# Patient Record
Sex: Male | Born: 1955
Health system: Southern US, Community
[De-identification: ages and names within clinical notes are randomized; demographics above are authoritative.]

## PROBLEM LIST (undated history)

## (undated) DIAGNOSIS — I10 Essential (primary) hypertension: Secondary | ICD-10-CM

## (undated) DIAGNOSIS — T7840XA Allergy, unspecified, initial encounter: Secondary | ICD-10-CM

## (undated) DIAGNOSIS — G8929 Other chronic pain: Secondary | ICD-10-CM

## (undated) DIAGNOSIS — R011 Cardiac murmur, unspecified: Secondary | ICD-10-CM

## (undated) DIAGNOSIS — Z950 Presence of cardiac pacemaker: Secondary | ICD-10-CM

## (undated) DIAGNOSIS — N419 Inflammatory disease of prostate, unspecified: Secondary | ICD-10-CM

## (undated) DIAGNOSIS — F419 Anxiety disorder, unspecified: Secondary | ICD-10-CM

## (undated) DIAGNOSIS — G4730 Sleep apnea, unspecified: Secondary | ICD-10-CM

## (undated) DIAGNOSIS — F32A Depression, unspecified: Secondary | ICD-10-CM

## (undated) DIAGNOSIS — F329 Major depressive disorder, single episode, unspecified: Secondary | ICD-10-CM

## (undated) DIAGNOSIS — E119 Type 2 diabetes mellitus without complications: Secondary | ICD-10-CM

## (undated) DIAGNOSIS — G709 Myoneural disorder, unspecified: Secondary | ICD-10-CM

## (undated) DIAGNOSIS — Z22322 Carrier or suspected carrier of Methicillin resistant Staphylococcus aureus: Secondary | ICD-10-CM

## (undated) DIAGNOSIS — N200 Calculus of kidney: Secondary | ICD-10-CM

## (undated) DIAGNOSIS — G43909 Migraine, unspecified, not intractable, without status migrainosus: Secondary | ICD-10-CM

## (undated) DIAGNOSIS — N433 Hydrocele, unspecified: Secondary | ICD-10-CM

## (undated) DIAGNOSIS — Z87442 Personal history of urinary calculi: Secondary | ICD-10-CM

## (undated) DIAGNOSIS — M1990 Unspecified osteoarthritis, unspecified site: Secondary | ICD-10-CM

## (undated) HISTORY — DX: Myoneural disorder, unspecified: G70.9

## (undated) HISTORY — DX: Major depressive disorder, single episode, unspecified: F32.9

## (undated) HISTORY — DX: Inflammatory disease of prostate, unspecified: N41.9

## (undated) HISTORY — DX: Carrier or suspected carrier of methicillin resistant Staphylococcus aureus: Z22.322

## (undated) HISTORY — DX: Sleep apnea, unspecified: G47.30

## (undated) HISTORY — DX: Presence of cardiac pacemaker: Z95.0

## (undated) HISTORY — DX: Allergy, unspecified, initial encounter: T78.40XA

## (undated) HISTORY — DX: Calculus of kidney: N20.0

## (undated) HISTORY — DX: Essential (primary) hypertension: I10

## (undated) HISTORY — DX: Other chronic pain: G89.29

## (undated) HISTORY — DX: Anxiety disorder, unspecified: F41.9

## (undated) HISTORY — DX: Hydrocele, unspecified: N43.3

## (undated) HISTORY — DX: Migraine, unspecified, not intractable, without status migrainosus: G43.909

## (undated) HISTORY — PX: SPINE SURGERY: SHX786

## (undated) HISTORY — DX: Depression, unspecified: F32.A

## (undated) HISTORY — DX: Personal history of urinary calculi: Z87.442

---
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
## 2004-08-07 HISTORY — PX: CARPAL TUNNEL RELEASE: SHX101

## 2005-01-11 ENCOUNTER — Ambulatory Visit: Payer: Self-pay | Admitting: Family Medicine

## 2005-04-27 ENCOUNTER — Ambulatory Visit: Payer: Self-pay | Admitting: Family Medicine

## 2005-07-21 ENCOUNTER — Ambulatory Visit (HOSPITAL_COMMUNITY): Admission: RE | Admit: 2005-07-21 | Discharge: 2005-07-21 | Payer: Self-pay | Admitting: Neurosurgery

## 2005-08-04 ENCOUNTER — Ambulatory Visit (HOSPITAL_COMMUNITY): Admission: RE | Admit: 2005-08-04 | Discharge: 2005-08-04 | Payer: Self-pay | Admitting: Neurosurgery

## 2006-05-28 ENCOUNTER — Other Ambulatory Visit: Payer: Self-pay

## 2006-05-29 ENCOUNTER — Observation Stay: Payer: Self-pay | Admitting: Internal Medicine

## 2006-06-15 ENCOUNTER — Ambulatory Visit: Payer: Self-pay | Admitting: Cardiology

## 2006-07-26 ENCOUNTER — Encounter: Admission: RE | Admit: 2006-07-26 | Discharge: 2006-07-26 | Payer: Self-pay | Admitting: Neurosurgery

## 2006-08-07 HISTORY — PX: CERVICAL SPINE SURGERY: SHX589

## 2006-08-07 HISTORY — PX: ULNAR NERVE REPAIR: SHX2594

## 2006-08-21 ENCOUNTER — Ambulatory Visit (HOSPITAL_COMMUNITY): Admission: RE | Admit: 2006-08-21 | Discharge: 2006-08-22 | Payer: Self-pay | Admitting: Neurosurgery

## 2006-10-11 ENCOUNTER — Ambulatory Visit: Payer: Self-pay | Admitting: Cardiology

## 2006-10-19 ENCOUNTER — Ambulatory Visit: Payer: Self-pay | Admitting: Neurosurgery

## 2006-11-27 ENCOUNTER — Encounter: Payer: Self-pay | Admitting: Neurosurgery

## 2006-12-06 ENCOUNTER — Encounter: Payer: Self-pay | Admitting: Neurosurgery

## 2006-12-28 ENCOUNTER — Encounter: Admission: RE | Admit: 2006-12-28 | Discharge: 2006-12-28 | Payer: Self-pay | Admitting: Neurosurgery

## 2007-04-05 LAB — HM COLONOSCOPY: HM Colonoscopy: NORMAL

## 2007-05-20 ENCOUNTER — Ambulatory Visit: Payer: Self-pay | Admitting: Neurological Surgery

## 2007-06-05 ENCOUNTER — Ambulatory Visit (HOSPITAL_COMMUNITY): Admission: RE | Admit: 2007-06-05 | Discharge: 2007-06-05 | Payer: Self-pay | Admitting: Neurological Surgery

## 2007-07-08 HISTORY — PX: COLONOSCOPY: SHX174

## 2007-08-05 ENCOUNTER — Ambulatory Visit: Payer: Self-pay | Admitting: Unknown Physician Specialty

## 2007-08-08 HISTORY — PX: ROTATOR CUFF REPAIR: SHX139

## 2007-08-08 HISTORY — PX: PACEMAKER PLACEMENT: SHX43

## 2007-08-14 ENCOUNTER — Ambulatory Visit: Payer: Self-pay | Admitting: Cardiology

## 2007-08-14 ENCOUNTER — Other Ambulatory Visit: Payer: Self-pay

## 2007-08-15 ENCOUNTER — Other Ambulatory Visit: Payer: Self-pay

## 2008-02-17 ENCOUNTER — Ambulatory Visit: Payer: Self-pay | Admitting: Unknown Physician Specialty

## 2008-02-24 ENCOUNTER — Ambulatory Visit: Payer: Self-pay | Admitting: Unknown Physician Specialty

## 2008-05-19 ENCOUNTER — Encounter: Payer: Self-pay | Admitting: Unknown Physician Specialty

## 2008-06-07 ENCOUNTER — Encounter: Payer: Self-pay | Admitting: Unknown Physician Specialty

## 2008-07-22 ENCOUNTER — Encounter: Payer: Self-pay | Admitting: Unknown Physician Specialty

## 2009-02-01 ENCOUNTER — Ambulatory Visit: Payer: Self-pay | Admitting: Pain Medicine

## 2009-02-16 ENCOUNTER — Ambulatory Visit: Payer: Self-pay | Admitting: Pain Medicine

## 2009-03-09 ENCOUNTER — Ambulatory Visit: Payer: Self-pay | Admitting: Physician Assistant

## 2009-03-23 ENCOUNTER — Ambulatory Visit: Payer: Self-pay | Admitting: Pain Medicine

## 2009-04-13 ENCOUNTER — Ambulatory Visit: Payer: Self-pay | Admitting: Physician Assistant

## 2010-08-07 HISTORY — PX: LITHOTRIPSY: SUR834

## 2010-12-20 NOTE — Op Note (Signed)
Mark [name], Mark [name]                ACCOUNT NO.:  [id number]   MEDICAL RECORD NO.:  [id number]          PATIENT TYPE:  AMB   LOCATION:  SDS                          FACILITY:  [facility]   PHYSICIAN:  Tia Alert, MD     DATE OF BIRTH:  [birth year]   DATE OF PROCEDURE:  05/30/2007  DATE OF DISCHARGE:                               OPERATIVE REPORT   PREOPERATIVE DIAGNOSIS:  Left ulnar neuropathy.   POSTOPERATIVE DIAGNOSIS:  Left ulnar neuropathy.   PROCEDURE:  Left ulnar nerve decompression.   SURGEON:  Tia Alert, M.D.   ANESTHESIA:  General endotracheal.   COMPLICATIONS:  None apparent.   INDICATIONS FOR THE PROCEDURE:  Mark [name] is a 55 year old gentleman who  is referred with numbness and tingling in the left fourth and fifth  digits, with weakness and clumsiness of the left hand.  Nerve conduction  studies were consistent with a significant left ulnar neuropathy at the  elbow.  I recommended ulnar nerve decompression.  He understood the  risks, benefits, and expected outcome and wished to proceed.   DESCRIPTION OF THE PROCEDURE:  The patient was taken to the operating  room, and after induction of adequate generalized endotracheal  anesthesia his left arm was extended on an arm board and then prepped  with DuraPrep from axilla of to the fingertips, and then draped in the  usual sterile fashion.  8 mL of local anesthesia was injected, and a  curvilinear incision was made over the medial epicondyle of the ulna.  Dissection was carried down through the soft tissues, marching along the  olecranon to identify the ulnar groove.  I then palpated through and  dissected through scar tissue there overlying the ulnar nerve.  I  dissected proximally first up toward the triceps musculature,  decompressing the nerve as I went.  I then dissected distally until I  had to open the flexor retinaculum overlying the nerve.  I saw the  takeoff of the nerve to the flexor carpi ulnaris.  I  continued to  dissect along the nerve.  I did not circumferentially decompress the  nerve, as I was afraid this might affect the blood supply of the nerve,  but I did bend the elbow.  It did not seem to be compressed.  I did  remove significant scar tissue from the top of the ulnar nerve just  distal to the elbow.  I felt like this was the compressive lesion.  I  then palpated proximally and distally with a curved hemostat.  I felt  like I had a good decompression of the ulnar nerve.  I irrigated with  saline solution containing bacitracin and then closed in layers of 2-0  Vicryl in the subcutaneous tissues and 3-0 Vicryl in  the subcuticular tissues, and the skin was closed with Benzoin and Steri-  Strips.  The arm was then wrapped in a Kerlix and an Ace bandage.  The  patient was then awakened from general anesthesia and transferred to the  recovery room in stable condition.  At the end of the procedure, all  sponge, needle, and instrument counts were correct.      Tia Alert, MD  Electronically Signed     DSJ/MEDQ  D:  06/05/2007  T:  06/06/2007  Job:  [id number]

## 2010-12-23 NOTE — Op Note (Signed)
Mark [name], Mark [name]                ACCOUNT NO.:  [id number]   MEDICAL RECORD NO.:  [id number]          PATIENT TYPE:  AMB   LOCATION:  SDS                          FACILITY:  [facility]   PHYSICIAN:  Reinaldo Meeker, M.D. DATE OF BIRTH:  23-Feb-1956   DATE OF PROCEDURE:  08/04/2005  DATE OF DISCHARGE:  08/04/2005                                 OPERATIVE REPORT   PREOPERATIVE DIAGNOSIS:  Right carpal tunnel syndrome.   POSTOPERATIVE DIAGNOSIS:  Right carpal tunnel syndrome.   OPERATION PERFORMED:  Right carpal tunnel release.   SURGEON:  Reinaldo Meeker, M.D.   OPERATION IN DETAIL:  After the induction of general anesthesia the  patient's hand, wrist and forearm were prepped and draped in the usual  sterile fashion.  A curvilinear incision was made starting at the wrist  heading into the palm in line with the ring finger, then slightly in the  radial direction.  The subcutaneous tissue incised and a self-retaining  retractor was placed for exposure.   Starting at the wrist the median nerve was identified as it entered the  carpal tunnel under the transverse carpal ligament.  The ligament was then  incised from a proximal-to-distal direction.  This gave excellent  decompression of the underlying median nerve.  At this point inspection was  carried out in all directions for any evidence of residual and none could be  identified.  Large amounts of irrigation were carried out.  Any bleeding was  controlled with bipolar coagulation.   The wound was then closed using interrupted Vicryl on the subcutaneous  tissue and interrupted nylon on the skin.  A sterile dressing and an Ace  wrap were then applied.   The patient was then taken to the recovery room in stable condition.           ______________________________  Reinaldo Meeker, M.D.     ROK/MEDQ  D:  08/04/2005  T:  08/05/2005  Job:  [id number]

## 2010-12-23 NOTE — Op Note (Signed)
NAME:  Mark [name], Mark [name]                ACCOUNT NO.:  [id number]   MEDICAL RECORD NO.:  [id number]          PATIENT TYPE:  AMB   LOCATION:  SDS                          FACILITY:  [facility]   PHYSICIAN:  Reinaldo Meeker, M.D. DATE OF BIRTH:  1956-03-01   DATE OF PROCEDURE:  07/21/2005  DATE OF DISCHARGE:                                 OPERATIVE REPORT   PREOPERATIVE DIAGNOSIS:  Left carpal tunnel syndrome.   POSTOPERATIVE DIAGNOSIS:  Left carpal tunnel syndrome.   PROCEDURE:  Left carpal tunnel release.   PROCEDURE IN DETAIL:  Used on the patient's left upper extremity, and his  arm was prepped and draped in the usual sterile fashion.  The case was then  started with a curvilinear incision starting in the left palm at the level  of wrist in line with the ring finger, heading up into the palm and then  turned in a slightly radial direction.  Unfortunately, the patient was  unable to stay still and cooperate for the procedure, appeared to be under  some discomfort, so it was therefore elected to put him under brief LMA  general anesthetic.  This was done without incident and the case was carried  forward.   Soft tissues of the palm were then incised.  Self-retaining retractor was  placed for exposure.  Dissection was carried under the transverse carpal  ligament which was then incised and this decompressed and exposed the  underlying median nerve.  Very thorough decompression was carried up into  the mid palm and also up into the distal wrist by undermining the skin of  the distal wrist.   At this time, inspection was carried out in all directions without any  evidence of residual compression and none could be identified.  Irrigation  was carried out.  The wound was then closed with interrupted Vicryl on the  platysma muscle and inverted nylon on the skin.  Bulky sterile dressing was  then applied.  The patient was taken to the recovery room in stable  condition.     ______________________________  Reinaldo Meeker, M.D.     ROK/MEDQ  D:  07/21/2005  T:  07/24/2005  Job:  [id number]

## 2010-12-23 NOTE — Op Note (Signed)
NAME:  Mark [name], Mark [name]                ACCOUNT NO.:  [id number]   MEDICAL RECORD NO.:  [id number]          PATIENT TYPE:  OIB   LOCATION:  3172                         FACILITY:  [facility]   PHYSICIAN:  Reinaldo Meeker, M.D. DATE OF BIRTH:  May 13, 1956   DATE OF PROCEDURE:  08/21/2006  DATE OF DISCHARGE:                               OPERATIVE REPORT   PREOPERATIVE DIAGNOSIS:  Spondylosis C5-6 bilateral.   POSTOPERATIVE DIAGNOSIS:  Spondylosis C5-6 bilateral.   PROCEDURE:  C5-6 anterior cervical diskectomy with bone bank fusion  followed by Mystique anterior cervical plating.   SURGEON:  Reinaldo Meeker, M.D.   ASSISTANT:  Kathaleen Maser. Pool, M.D.   PROCEDURE:  After being placed in the supine position in 10 pounds  halter traction, the patient's neck was prepped and draped in the usual  sterile fashion.  Localizing fluoroscopy was used prior to incision to  identify the appropriate level.  Transverse incision was made in the  right anterior neck starting at the midline and heading towards the  medial aspect of sternocleidomastoid muscle.  The platysmas was incised  transversely.  The natural fascial plane reflecting the strap muscles  medially and sternocleidomastoid laterally was identified and followed  down to the anterior aspect cervical spine.  Longus colli muscles were  identified, split in the midline, stripped away bilaterally with Chief Operating Officer.  Self retractor was placed for exposure.  X-  rays showed approach the appropriate level.  Using 15 blade the annulus  of the disk at C5-C6 was incised.  Using pituitary rongeurs and  curettes, approximately 90% of the disk material was removed.  High-  speed drill used to widen the interspace.  At this time the microscope  was draped brought in the field and used for remainder of the case.  Using microsection technique, the remainder of disk material on the  posterior longitudinal ligament was removed.  The ligament  was then  incised transversely and the cut edge removed with Kerrison punch.  Thorough decompression was then carried out on the spinal dura and  proximal foramen bilaterally until the C6 nerve roots were identified  and well decompressed.  At this time, inspection was carried out in all  directions for any evidence of residual compression and none could be  identified.  Large amounts of irrigation were carried out.  Measurements  were taken and 8 mm bone bank plug was reconstituted.  After irrigating  once more and confirming hemostasis, the plug was impacted difficulty.  Fluoroscopy showed it to be in good position.  An appropriate length  Mystique anterior cervical plate was then chosen.  Under fluoroscopic  guidance drill holes were placed followed by tapping, retapping and  placing 13 mm, screws x4.  Final fluoroscopy showed the plate screws and  plug to all be in good position.  Large amounts of irrigation carried  out, any bleeding was controlled bipolar coagulation.  The wound was  then closed with interrupted Vicryl on the platysma muscle, inverted 5-0  PDS in subcuticular layer and Steri-Strips on the skin.  Sterile  dressing and soft collar applied.  The patient was extubated, taken to  recovery room in stable condition.          ______________________________  Reinaldo Meeker, M.D.    ROK/MEDQ  D:  08/21/2006  T:  08/21/2006  Job:  [id number]

## 2011-03-27 ENCOUNTER — Encounter: Payer: Self-pay | Admitting: Internal Medicine

## 2011-03-28 ENCOUNTER — Encounter: Payer: Self-pay | Admitting: Internal Medicine

## 2011-03-28 ENCOUNTER — Ambulatory Visit (INDEPENDENT_AMBULATORY_CARE_PROVIDER_SITE_OTHER): Payer: Medicare Other | Admitting: Internal Medicine

## 2011-03-28 VITALS — BP 121/80 | HR 75 | Temp 98.3°F | Ht 68.0 in | Wt 208.0 lb

## 2011-03-28 DIAGNOSIS — F331 Major depressive disorder, recurrent, moderate: Secondary | ICD-10-CM | POA: Insufficient documentation

## 2011-03-28 DIAGNOSIS — G894 Chronic pain syndrome: Secondary | ICD-10-CM | POA: Insufficient documentation

## 2011-03-28 DIAGNOSIS — G8929 Other chronic pain: Secondary | ICD-10-CM

## 2011-03-28 DIAGNOSIS — F329 Major depressive disorder, single episode, unspecified: Secondary | ICD-10-CM

## 2011-03-28 DIAGNOSIS — F32A Depression, unspecified: Secondary | ICD-10-CM

## 2011-03-28 MED ORDER — OXYCODONE-ACETAMINOPHEN 7.5-500 MG PO TABS: 1.0000 | ORAL_TABLET | Freq: Two times a day (BID) | ORAL | Status: DC | PRN

## 2011-03-28 MED ORDER — OXYCODONE HCL 10 MG PO TB12: 10.0000 mg | ORAL_TABLET | Freq: Two times a day (BID) | ORAL | Status: DC

## 2011-03-28 NOTE — Progress Notes (Signed)
  Subjective:    Patient ID: Mark [name], male    DOB: 05-Nov-1955, 55 y.o.   MRN: [id number]  Back Pain This is a chronic problem. The current episode started more than 1 year ago. The problem occurs constantly. The problem is unchanged. The pain is present in the thoracic spine. The quality of the pain is described as aching and burning. The pain does not radiate. The pain is severe. The pain is worse during the day (with prolonged standing or activity). The symptoms are aggravated by standing. Associated symptoms include numbness and paresthesias. Pertinent negatives include no abdominal pain, bladder incontinence, bowel incontinence, chest pain, fever or weakness. Risk factors include obesity. He has tried analgesics, muscle relaxant and heat (steroid injection, cervical spine decompression) for the symptoms. The treatment provided significant (with use of low dose narcotic) relief.      Review of Systems  Constitutional: Negative for fever, chills, diaphoresis, activity change, appetite change, fatigue and unexpected weight change.  HENT: Positive for neck pain.   Respiratory: Negative for cough and shortness of breath.   Cardiovascular: Negative for chest pain and leg swelling.  Gastrointestinal: Negative for abdominal pain, diarrhea, constipation and bowel incontinence.  Genitourinary: Negative for bladder incontinence.  Musculoskeletal: Positive for myalgias, back pain and arthralgias. Negative for gait problem.  Neurological: Positive for numbness and paresthesias. Negative for dizziness and weakness.       Objective:   Physical Exam  Constitutional: He appears well-developed and well-nourished. No distress.  HENT:  Head: Normocephalic and atraumatic.  Eyes: Conjunctivae and EOM are normal. Pupils are equal, round, and reactive to light. No scleral icterus.  Neck: Normal range of motion. Neck supple.  Cardiovascular: Normal rate, regular rhythm and normal heart sounds.  Exam  reveals no gallop and no friction rub.   No murmur heard. Pulmonary/Chest: Effort normal and breath sounds normal. No respiratory distress. He has no wheezes. He has no rales. He exhibits no tenderness.  Musculoskeletal: Normal range of motion.  Neurological: He is alert. He has normal strength. No sensory deficit. He exhibits normal muscle tone.  Skin: Skin is warm and dry. He is not diaphoretic.  Psychiatric: He has a normal mood and affect. His behavior is normal. Judgment and thought content normal.           Assessment:    Chronic thoracic and cervical spine back pain. Secondary to nerve compression after cervical spine surgery.  Has been followed by neurology and steroid injection was attempted with no improvement.  Recently, has done very well on low dose of oxycontin and prn percocet for pain relief. Uses percocet only on rare occasions. Denies narcotic side effects such as drowsiness, constipation.   Plan:    Natural history and expected course discussed. Questions answered. Agricultural engineer distributed. Pt will continue Oxycontin and prn percocet. UDS submitted today. RTC 3 months.

## 2011-03-28 NOTE — Progress Notes (Signed)
Addended by: Jobie Quaker on: 03/28/2011 03:54 PM   Modules accepted: Orders

## 2011-03-28 NOTE — Patient Instructions (Signed)

## 2011-03-29 LAB — DRUG SCREEN, URINE
Amphetamine Screen, Ur: NEGATIVE
Barbiturate Quant, Ur: NEGATIVE
Benzodiazepines.: NEGATIVE
Cocaine Metabolites: NEGATIVE
Creatinine,U: 134.7 mg/dL
Marijuana Metabolite: NEGATIVE
Methadone: NEGATIVE
Opiates: POSITIVE — AB
Phencyclidine (PCP): NEGATIVE
Propoxyphene: NEGATIVE

## 2011-04-24 ENCOUNTER — Other Ambulatory Visit: Payer: Self-pay | Admitting: *Deleted

## 2011-04-24 DIAGNOSIS — G8929 Other chronic pain: Secondary | ICD-10-CM

## 2011-04-24 MED ORDER — CLONAZEPAM 0.5 MG PO TABS: 0.5000 mg | ORAL_TABLET | Freq: Three times a day (TID) | ORAL | Status: DC | PRN

## 2011-04-24 MED ORDER — OXYCODONE HCL 10 MG PO TB12: 10.0000 mg | ORAL_TABLET | Freq: Two times a day (BID) | ORAL | Status: DC

## 2011-04-24 NOTE — Telephone Encounter (Signed)
Patient is asking that the klonopin be called in to [facility] [address] and says that his wife will pick up the oxycontin rx at her appt this wed.

## 2011-05-17 LAB — BASIC METABOLIC PANEL
BUN: 8
CO2: 28
Calcium: 9.4
Chloride: 105
Creatinine, Ser: 0.74
GFR calc Af Amer: >60
GFR calc non Af Amer: >60
Glucose, Bld: 156 — ABNORMAL HIGH
Potassium: 3.6
Sodium: 141

## 2011-05-17 LAB — PROTIME-INR
INR: 1
Prothrombin Time: 13.7

## 2011-05-17 LAB — CBC
HCT: 48.1
Hemoglobin: 16.8
MCHC: 34.9
MCV: 87.9
Platelets: 190
RBC: 5.47
RDW: 13.2
WBC: 7.7

## 2011-05-17 LAB — APTT: aPTT: 29

## 2011-05-23 ENCOUNTER — Other Ambulatory Visit: Payer: Self-pay | Admitting: Internal Medicine

## 2011-05-23 DIAGNOSIS — G8929 Other chronic pain: Secondary | ICD-10-CM

## 2011-05-23 MED ORDER — OXYCODONE HCL 10 MG PO TB12: 10.0000 mg | ORAL_TABLET | Freq: Two times a day (BID) | ORAL | Status: DC

## 2011-05-23 NOTE — Telephone Encounter (Signed)
Pt would like to get refill on his oxycontin 10mg  2x daily every 12 hours. Please call when ready

## 2011-05-29 ENCOUNTER — Other Ambulatory Visit: Payer: Self-pay | Admitting: Internal Medicine

## 2011-05-29 DIAGNOSIS — G8929 Other chronic pain: Secondary | ICD-10-CM

## 2011-05-29 MED ORDER — OXYCODONE HCL 10 MG PO TB12: 10.0000 mg | ORAL_TABLET | Freq: Two times a day (BID) | ORAL | Status: DC

## 2011-06-05 ENCOUNTER — Other Ambulatory Visit: Payer: Self-pay | Admitting: Internal Medicine

## 2011-06-07 ENCOUNTER — Ambulatory Visit: Payer: Self-pay | Admitting: Urology

## 2011-06-14 ENCOUNTER — Ambulatory Visit: Payer: Self-pay | Admitting: Urology

## 2011-06-19 ENCOUNTER — Ambulatory Visit: Payer: Self-pay | Admitting: Urology

## 2011-06-19 ENCOUNTER — Other Ambulatory Visit: Payer: Self-pay | Admitting: Internal Medicine

## 2011-06-19 DIAGNOSIS — G8929 Other chronic pain: Secondary | ICD-10-CM

## 2011-06-19 MED ORDER — OXYCODONE HCL 10 MG PO TB12: 10.0000 mg | ORAL_TABLET | Freq: Two times a day (BID) | ORAL | Status: DC

## 2011-06-21 ENCOUNTER — Ambulatory Visit: Payer: Self-pay | Admitting: Urology

## 2011-06-26 ENCOUNTER — Ambulatory Visit (INDEPENDENT_AMBULATORY_CARE_PROVIDER_SITE_OTHER): Payer: Medicare Other | Admitting: Internal Medicine

## 2011-06-26 ENCOUNTER — Encounter: Payer: Self-pay | Admitting: Internal Medicine

## 2011-06-26 VITALS — BP 112/78 | HR 92 | Temp 98.4°F | Resp 16 | Wt 193.2 lb

## 2011-06-26 DIAGNOSIS — G8929 Other chronic pain: Secondary | ICD-10-CM

## 2011-06-26 DIAGNOSIS — N200 Calculus of kidney: Secondary | ICD-10-CM | POA: Insufficient documentation

## 2011-06-26 MED ORDER — OXYCODONE-ACETAMINOPHEN 7.5-500 MG PO TABS: 1.0000 | ORAL_TABLET | Freq: Two times a day (BID) | ORAL | Status: DC | PRN

## 2011-06-26 MED ORDER — OXYCODONE HCL 10 MG PO TB12: 10.0000 mg | ORAL_TABLET | Freq: Two times a day (BID) | ORAL | Status: DC

## 2011-06-26 NOTE — Assessment & Plan Note (Signed)
Secondary to chronic degenerative changes and nerve compression in thoracic spine. S/p extensive evaluation and interventions including steroid injections at pain clinic without improvement. Pain well controlled on current medications. Will continue oxycontin for baseline pain control with oxycodone for breakthrough pain. Follow up 3 months.

## 2011-06-26 NOTE — Progress Notes (Signed)
Subjective:    Patient ID: Mark [name], male    DOB: [birth year], 55 y.o.   MRN: [id number]  HPI 55YO male with h/o chronic back pain secondary to degenerative changes and nerve compression in thoracic spine, presents for follow up. Pain is described as severe and aching. Located upper back. Occasionally radiates down arms. No weakness noted BUE.  Pain controlled with chronic oxycontin and prn oxycodone. Pt has followed medication instructions. No early refill requests. UDS last visit negative.  Pt also notes recent episode of bilateral nephrolithiasis.  Notes that he is scheduled for right sided lithotripsy next month. Continues to have right sided flank pain. No fever, chills. No obvious hematuria.  Outpatient Encounter Prescriptions as of 06/26/2011  Medication Sig Dispense Refill  . aspirin 81 MG tablet Take 81 mg by mouth daily.        Marland Kitchen b complex vitamins tablet Take 1 tablet by mouth daily.        . cetirizine (ZYRTEC) 10 MG tablet Take 10 mg by mouth daily.        . chlorzoxazone (PARAFON) 500 MG tablet Take 500 mg by mouth every 6 (six) hours as needed.        . Cholecalciferol (VITAMIN D3) 1000 UNITS CAPS Take 1 Units by mouth daily.        . clindamycin (CLEOCIN) 150 MG capsule Take 150 mg by mouth daily. 4 caps 1 hour before dental procedures.       . clonazePAM (KLONOPIN) 0.5 MG tablet Take 1 tablet (0.5 mg total) by mouth 3 (three) times daily as needed.  90 tablet  4  . CRANBERRY PO Take 3,600 mg by mouth 2 (two) times daily.        Marland Kitchen desonide (DESOWEN) 0.05 % lotion Apply 1 application topically daily.        . fish oil-omega-3 fatty acids 1000 MG capsule Take 2 g by mouth daily.        Marland Kitchen FLUoxetine (PROZAC) 20 MG capsule Take 20 mg by mouth daily.        Marland Kitchen FLUoxetine (PROZAC) 40 MG capsule Take 40 mg by mouth daily.        . fluticasone (FLONASE) 50 MCG/ACT nasal spray Place 2 sprays into the nose daily.        . furosemide (LASIX) 20 MG tablet TAKE 1 TABLET EVERY MORNING   90 tablet  3  . Garlic 1000 MG CAPS Take 1 capsule by mouth daily.        . Multiple Vitamin (MULTIVITAMIN PO) Take 1 tablet by mouth daily.        . naproxen (NAPROSYN) 500 MG tablet Take 500 mg by mouth 2 (two) times daily with a meal.        . omeprazole (PRILOSEC) 20 MG capsule Take 20 mg by mouth daily.        Marland Kitchen oxyCODONE (OXYCONTIN) 10 MG 12 hr tablet Take 1 tablet (10 mg total) by mouth every 12 (twelve) hours.  60 tablet  0  . oxyCODONE-acetaminophen (PERCOCET) 7.5-500 MG per tablet Take 1 tablet by mouth 2 (two) times daily as needed.  60 tablet  0  . traZODone (DESYREL) 100 MG tablet Take 100 mg by mouth at bedtime.          Review of Systems  Constitutional: Negative for fever, chills, diaphoresis and fatigue.  HENT: Positive for neck pain.   Respiratory: Negative for cough and shortness of breath.   Cardiovascular: Negative  for chest pain.  Gastrointestinal: Negative for abdominal pain.  Genitourinary: Positive for flank pain (recently with bilateral nephrolithiasis).  Musculoskeletal: Positive for myalgias, back pain and arthralgias.  Psychiatric/Behavioral: Negative for dysphoric mood. The patient is not nervous/anxious.    BP 112/78  Pulse 92  Temp(Src) 98.4 F (36.9 C) (Oral)  Resp 16  Wt 193 lb 4 oz (87.658 kg)  SpO2 94%     Objective:   Physical Exam  Constitutional: He is oriented to person, place, and time. He appears well-developed and well-nourished. No distress.  HENT:  Head: Normocephalic and atraumatic.  Right Ear: External ear normal.  Left Ear: External ear normal.  Nose: Nose normal.  Mouth/Throat: Oropharynx is clear and moist. No oropharyngeal exudate.  Eyes: Conjunctivae and EOM are normal. Pupils are equal, round, and reactive to light. Right eye exhibits no discharge. Left eye exhibits no discharge. No scleral icterus.  Neck: Normal range of motion. Neck supple. No tracheal deviation present. No thyromegaly present.  Cardiovascular: Normal  rate, regular rhythm and normal heart sounds.  Exam reveals no gallop and no friction rub.   No murmur heard. Pulmonary/Chest: Effort normal and breath sounds normal. No respiratory distress. He has no wheezes. He has no rales. He exhibits no tenderness.  Musculoskeletal: He exhibits no edema.       Cervical back: He exhibits decreased range of motion, tenderness, pain and spasm.  Lymphadenopathy:    He has no cervical adenopathy.  Neurological: He is alert and oriented to person, place, and time. No cranial nerve deficit. Coordination normal.  Skin: Skin is warm and dry. No rash noted. He is not diaphoretic. No erythema. No pallor.  Psychiatric: He has a normal mood and affect. His behavior is normal. Judgment and thought content normal.          Assessment & Plan:

## 2011-06-26 NOTE — Assessment & Plan Note (Signed)
Followed by urology. Scheduled for lithotripsy next month. Will continue to monitor.

## 2011-06-27 ENCOUNTER — Other Ambulatory Visit: Payer: Self-pay | Admitting: Internal Medicine

## 2011-06-28 ENCOUNTER — Ambulatory Visit: Payer: Medicare Other | Admitting: Internal Medicine

## 2011-07-03 ENCOUNTER — Ambulatory Visit: Payer: Medicare Other | Admitting: Internal Medicine

## 2011-07-06 ENCOUNTER — Ambulatory Visit: Payer: Self-pay | Admitting: Urology

## 2011-07-19 ENCOUNTER — Other Ambulatory Visit: Payer: Self-pay | Admitting: Internal Medicine

## 2011-07-24 ENCOUNTER — Ambulatory Visit: Payer: Self-pay | Admitting: Urology

## 2011-08-29 ENCOUNTER — Ambulatory Visit: Payer: Self-pay | Admitting: Urology

## 2011-09-27 ENCOUNTER — Encounter: Payer: Self-pay | Admitting: Internal Medicine

## 2011-09-27 ENCOUNTER — Ambulatory Visit: Payer: Self-pay | Admitting: Urology

## 2011-09-27 ENCOUNTER — Ambulatory Visit (INDEPENDENT_AMBULATORY_CARE_PROVIDER_SITE_OTHER): Payer: Medicare Other | Admitting: Internal Medicine

## 2011-09-27 DIAGNOSIS — N200 Calculus of kidney: Secondary | ICD-10-CM

## 2011-09-27 DIAGNOSIS — G4700 Insomnia, unspecified: Secondary | ICD-10-CM

## 2011-09-27 DIAGNOSIS — G8929 Other chronic pain: Secondary | ICD-10-CM

## 2011-09-27 MED ORDER — OXYCODONE HCL 10 MG PO TB12: 10.0000 mg | ORAL_TABLET | Freq: Two times a day (BID) | ORAL | Status: DC

## 2011-09-27 MED ORDER — OXYCODONE-ACETAMINOPHEN 7.5-500 MG PO TABS: 1.0000 | ORAL_TABLET | Freq: Two times a day (BID) | ORAL | Status: DC | PRN

## 2011-09-27 MED ORDER — ZOLPIDEM TARTRATE ER 6.25 MG PO TBCR: 6.2500 mg | EXTENDED_RELEASE_TABLET | Freq: Every evening | ORAL | Status: DC | PRN

## 2011-09-27 NOTE — Assessment & Plan Note (Signed)
No improvement with trazodone, so stopped medication. Will try Ambien 6.25mg  daily. Follow up if symptoms not improved with medication.

## 2011-09-27 NOTE — Assessment & Plan Note (Signed)
Secondary to chronic DJD in thoracic spine.  Stable on current medications for >2 years. Will continue at current dose. Follow up q3 months while on narcotic medications.

## 2011-09-27 NOTE — Progress Notes (Signed)
Subjective:    Patient ID: Mark [name], male    DOB: 04-Jan-1956, 56 y.o.   MRN: [id number]  HPI 56YO male with h/o chronic pain in his thoracic spine presents for follow up.  In regards to chronic pain, he reports symptoms are well controlled. He reports full compliance with his medications. He has recently been living with his mother and helping her move items in her home, so occasionally has to use percocet for breakthrough pain.  He denies drowsiness, constipation or other symptoms from use of medications.  He has not requested early refills and illicit drug screens have been negative.  He is concerned today about recent insomnia. He has difficulty both falling and staying asleep.  He was taking trazodone, with little improvement. He would like to try another medication.  He practices good sleep hygiene.  In regards to recent episode of nephrolithiasis, he denies any recurrent symptoms of pain, hematuria.  He had follow up renal US today and results are pending.  Outpatient Encounter Prescriptions as of 09/27/2011  Medication Sig Dispense Refill  . aspirin 81 MG tablet Take 81 mg by mouth daily.        Marland Kitchen b complex vitamins tablet Take 1 tablet by mouth daily.        . cetirizine (ZYRTEC) 10 MG tablet Take 10 mg by mouth daily.        . Cholecalciferol (VITAMIN D3) 1000 UNITS CAPS Take 1 Units by mouth daily.        . clindamycin (CLEOCIN) 150 MG capsule Take 150 mg by mouth daily. 4 caps 1 hour before dental procedures.       . clonazePAM (KLONOPIN) 0.5 MG tablet Take 1 tablet (0.5 mg total) by mouth 3 (three) times daily as needed.  90 tablet  4  . CRANBERRY PO Take 3,600 mg by mouth 2 (two) times daily.        Marland Kitchen desonide (DESOWEN) 0.05 % lotion Apply 1 application topically daily.        . fish oil-omega-3 fatty acids 1000 MG capsule Take 2 g by mouth daily.        Marland Kitchen FLUoxetine (PROZAC) 20 MG capsule TAKE 1 CAPSULE DAILY  90 capsule  3  . FLUoxetine (PROZAC) 40 MG capsule TAKE 1 CAPSULE  DAILY  90 capsule  3  . fluticasone (FLONASE) 50 MCG/ACT nasal spray Place 2 sprays into the nose daily.        . furosemide (LASIX) 20 MG tablet TAKE 1 TABLET EVERY MORNING  90 tablet  3  . Garlic 1000 MG CAPS Take 1 capsule by mouth daily.        . Multiple Vitamin (MULTIVITAMIN PO) Take 1 tablet by mouth daily.        . naproxen (NAPROSYN) 500 MG tablet Take 500 mg by mouth 2 (two) times daily with a meal.        . omeprazole (PRILOSEC) 20 MG capsule TAKE 1 CAPSULE DAILY  90 capsule  3  . oxyCODONE (OXYCONTIN) 10 MG 12 hr tablet Take 1 tablet (10 mg total) by mouth every 12 (twelve) hours.  60 tablet  0  . oxyCODONE-acetaminophen (PERCOCET) 7.5-500 MG per tablet Take 1 tablet by mouth 2 (two) times daily as needed.  60 tablet  0  . chlorzoxazone (PARAFON) 500 MG tablet Take 500 mg by mouth every 6 (six) hours as needed.        . zolpidem (AMBIEN CR) 6.25 MG CR tablet Take  1 tablet (6.25 mg total) by mouth at bedtime as needed for sleep.  30 tablet  3  . DISCONTD: traZODone (DESYREL) 100 MG tablet Take 100 mg by mouth at bedtime.         BP 110/70  Pulse 87  Temp(Src) 98.3 F (36.8 C) (Oral)  Ht 5' 7.5" (1.715 m)  Wt 189 lb (85.73 kg)  BMI 29.16 kg/m2  SpO2 97%  Review of Systems  Constitutional: Negative for fever, chills, activity change, appetite change, fatigue and unexpected weight change.  HENT: Positive for neck pain.   Eyes: Negative for visual disturbance.  Respiratory: Negative for cough and shortness of breath.   Cardiovascular: Negative for chest pain, palpitations and leg swelling.  Gastrointestinal: Negative for abdominal pain and abdominal distention.  Genitourinary: Negative for dysuria, urgency and difficulty urinating.  Musculoskeletal: Positive for myalgias, back pain and arthralgias. Negative for gait problem.  Skin: Negative for color change and rash.  Hematological: Negative for adenopathy.  Psychiatric/Behavioral: Positive for sleep disturbance. Negative for  dysphoric mood. The patient is not nervous/anxious.        Objective:   Physical Exam  Constitutional: He is oriented to person, place, and time. He appears well-developed and well-nourished. No distress.  HENT:  Head: Normocephalic and atraumatic.  Right Ear: External ear normal.  Left Ear: External ear normal.  Nose: Nose normal.  Mouth/Throat: Oropharynx is clear and moist. No oropharyngeal exudate.  Eyes: Conjunctivae and EOM are normal. Pupils are equal, round, and reactive to light. Right eye exhibits no discharge. Left eye exhibits no discharge. No scleral icterus.  Neck: Normal range of motion. Neck supple. No tracheal deviation present. No thyromegaly present.  Cardiovascular: Normal rate, regular rhythm and normal heart sounds.  Exam reveals no gallop and no friction rub.   No murmur heard. Pulmonary/Chest: Effort normal and breath sounds normal. No respiratory distress. He has no wheezes. He has no rales. He exhibits no tenderness.  Musculoskeletal: He exhibits no edema.       Cervical back: He exhibits decreased range of motion, tenderness, pain and spasm.  Lymphadenopathy:    He has no cervical adenopathy.  Neurological: He is alert and oriented to person, place, and time. No cranial nerve deficit. Coordination normal.  Skin: Skin is warm and dry. No rash noted. He is not diaphoretic. No erythema. No pallor.  Psychiatric: He has a normal mood and affect. His behavior is normal. Judgment and thought content normal.          Assessment & Plan:

## 2011-09-27 NOTE — Assessment & Plan Note (Signed)
S/p lithotripsy and passage of stones. Doing very well with no additional symptoms.  Will continue to monitor.

## 2011-09-28 LAB — COMPREHENSIVE METABOLIC PANEL
ALT: 27 U/L (ref 0–53)
AST: 29 U/L (ref 0–37)
Albumin: 4.3 g/dL (ref 3.5–5.2)
Alkaline Phosphatase: 49 U/L (ref 39–117)
BUN: 19 mg/dL (ref 6–23)
CO2: 29 mEq/L (ref 19–32)
Calcium: 9.4 mg/dL (ref 8.4–10.5)
Chloride: 104 mEq/L (ref 96–112)
Creatinine, Ser: 0.9 mg/dL (ref 0.4–1.5)
GFR: 90.88 mL/min (ref >60.00)
Glucose, Bld: 92 mg/dL (ref 70–99)
Potassium: 3.7 mEq/L (ref 3.5–5.1)
Sodium: 141 mEq/L (ref 135–145)
Total Bilirubin: 0.6 mg/dL (ref 0.3–1.2)
Total Protein: 6.8 g/dL (ref 6.0–8.3)

## 2011-10-09 ENCOUNTER — Encounter: Payer: Self-pay | Admitting: Internal Medicine

## 2011-10-09 ENCOUNTER — Ambulatory Visit (INDEPENDENT_AMBULATORY_CARE_PROVIDER_SITE_OTHER): Payer: Medicare Other | Admitting: Internal Medicine

## 2011-10-09 VITALS — BP 118/78 | HR 83 | Temp 97.2°F | Ht 67.5 in | Wt 190.0 lb

## 2011-10-09 DIAGNOSIS — G5790 Unspecified mononeuropathy of unspecified lower limb: Secondary | ICD-10-CM

## 2011-10-09 DIAGNOSIS — M792 Neuralgia and neuritis, unspecified: Secondary | ICD-10-CM

## 2011-10-09 MED ORDER — CHLORZOXAZONE 500 MG PO TABS: 500.0000 mg | ORAL_TABLET | Freq: Four times a day (QID) | ORAL | Status: DC | PRN

## 2011-10-09 MED ORDER — PREDNISONE (PAK) 10 MG PO TABS: ORAL_TABLET | ORAL | Status: AC

## 2011-10-09 NOTE — Progress Notes (Signed)
Subjective:    Patient ID: Mark [name], male    DOB: Jun 15, 1956, 56 y.o.   MRN: [id number]  HPI 56YO male with h/o chronic pain secondary to degenerative changes in the thoracic spine presents for acute visit c/o 1 week h/o right sided lower back pain.  Pain is described as aching and extending from spine to right flank.  No known injury to lower back.  Pt recently had renal US showing no nephrolithiasis. He denies hematuria, fever, chills.  He has been using left over muscle relaxer and percocet for pain with improvement.  He denies any weakness in his right leg.  Outpatient Encounter Prescriptions as of 10/09/2011  Medication Sig Dispense Refill  . aspirin 81 MG tablet Take 81 mg by mouth daily.        Marland Kitchen b complex vitamins tablet Take 1 tablet by mouth daily.        . cetirizine (ZYRTEC) 10 MG tablet Take 10 mg by mouth daily.        . chlorzoxazone (PARAFON) 500 MG tablet Take 1 tablet (500 mg total) by mouth every 6 (six) hours as needed.  180 tablet  3  . Cholecalciferol (VITAMIN D3) 1000 UNITS CAPS Take 1 Units by mouth daily.        . clindamycin (CLEOCIN) 150 MG capsule Take 150 mg by mouth daily. 4 caps 1 hour before dental procedures.       . clonazePAM (KLONOPIN) 0.5 MG tablet Take 1 tablet (0.5 mg total) by mouth 3 (three) times daily as needed.  90 tablet  4  . CRANBERRY PO Take 3,600 mg by mouth 2 (two) times daily.        Marland Kitchen desonide (DESOWEN) 0.05 % lotion Apply 1 application topically daily.        . fish oil-omega-3 fatty acids 1000 MG capsule Take 2 g by mouth daily.        Marland Kitchen FLUoxetine (PROZAC) 20 MG capsule TAKE 1 CAPSULE DAILY  90 capsule  3  . FLUoxetine (PROZAC) 40 MG capsule TAKE 1 CAPSULE DAILY  90 capsule  3  . fluticasone (FLONASE) 50 MCG/ACT nasal spray Place 2 sprays into the nose daily.        . furosemide (LASIX) 20 MG tablet TAKE 1 TABLET EVERY MORNING  90 tablet  3  . Garlic 1000 MG CAPS Take 1 capsule by mouth daily.        . Multiple Vitamin (MULTIVITAMIN  PO) Take 1 tablet by mouth daily.        . naproxen (NAPROSYN) 500 MG tablet Take 500 mg by mouth 2 (two) times daily with a meal.        . omeprazole (PRILOSEC) 20 MG capsule TAKE 1 CAPSULE DAILY  90 capsule  3  . oxyCODONE (OXYCONTIN) 10 MG 12 hr tablet Take 1 tablet (10 mg total) by mouth every 12 (twelve) hours.  60 tablet  0  . oxyCODONE-acetaminophen (PERCOCET) 7.5-500 MG per tablet Take 1 tablet by mouth 2 (two) times daily as needed.  60 tablet  0  . zolpidem (AMBIEN CR) 6.25 MG CR tablet Take 1 tablet (6.25 mg total) by mouth at bedtime as needed for sleep.  30 tablet  3  . DISCONTD: chlorzoxazone (PARAFON) 500 MG tablet Take 500 mg by mouth every 6 (six) hours as needed.        . predniSONE (STERAPRED UNI-PAK) 10 MG tablet Take 60mg  day 1 then taper by 10mg  daily  21  tablet  0    Review of Systems  Constitutional: Negative for fever and chills.  Respiratory: Negative for cough and shortness of breath.   Cardiovascular: Negative for chest pain.  Genitourinary: Positive for flank pain. Negative for dysuria, urgency, frequency, hematuria, decreased urine volume, difficulty urinating and testicular pain.  Musculoskeletal: Positive for myalgias, back pain and arthralgias.   BP 118/78  Pulse 83  Temp(Src) 97.2 F (36.2 C) (Oral)  Ht 5' 7.5" (1.715 m)  Wt 190 lb (86.183 kg)  BMI 29.32 kg/m2  SpO2 99%     Objective:   Physical Exam  Constitutional: He appears well-developed and well-nourished. No distress.  HENT:  Head: Normocephalic and atraumatic.  Eyes: EOM are normal. Pupils are equal, round, and reactive to light.  Neck: Normal range of motion.  Cardiovascular: Normal rate.   Pulmonary/Chest: Effort normal and breath sounds normal.  Abdominal: Soft.  Musculoskeletal: Normal range of motion. He exhibits tenderness.       Arms: Skin: He is not diaphoretic.          Assessment & Plan:

## 2011-10-09 NOTE — Assessment & Plan Note (Signed)
Likely secondary to DJD and nerve compression.  Will treat with steroid taper. Will continue muscle relaxants and prn percocet.  Pt will follow up with orthopedic surgeon for plain xray and further management.

## 2011-10-12 ENCOUNTER — Telehealth: Payer: Self-pay | Admitting: *Deleted

## 2011-10-12 NOTE — Telephone Encounter (Signed)
chlorzoxazone requires PA - Form pending completion. Need to know what muscle relaxers pt has tried in the past. (ex.Lottie Dawson, soma etc..) to complete form.

## 2011-10-13 ENCOUNTER — Telehealth: Payer: Self-pay | Admitting: Internal Medicine

## 2011-10-13 MED ORDER — NAPROXEN 500 MG PO TABS: 500.0000 mg | ORAL_TABLET | Freq: Two times a day (BID) | ORAL | Status: DC

## 2011-10-13 NOTE — Telephone Encounter (Signed)
Sent in, see other phone note, need to call pt to discuss muscle relaxer

## 2011-10-13 NOTE — Telephone Encounter (Signed)
[phone] Pt called to get refill on naproxen 500 mg 1 daily Mail order  [facility] mail rx Pt has 2 weeks worth of meds left  Pt is also checking on the muscle relaxer rx.

## 2011-10-16 NOTE — Telephone Encounter (Signed)
I spoke w/pt, he has tried other muscle relaxers, form completed and will be faxed in tomorrow.

## 2011-10-27 ENCOUNTER — Telehealth: Payer: Self-pay | Admitting: Internal Medicine

## 2011-10-27 MED ORDER — CLONAZEPAM 0.5 MG PO TABS: 0.5000 mg | ORAL_TABLET | Freq: Three times a day (TID) | ORAL | Status: DC | PRN

## 2011-10-27 NOTE — Telephone Encounter (Signed)
Medicare has approved Mark [name]'s Chalorzoxazone starting March 21,2013 for one year. An approval letter will be sent to the office and the patient.

## 2011-10-27 NOTE — Telephone Encounter (Signed)
Patient requesting RF of clonazepam 0.5 mg 1 tid prn - [facility] [place]

## 2011-10-27 NOTE — Telephone Encounter (Signed)
Called in.

## 2011-10-27 NOTE — Telephone Encounter (Signed)
Fine to fill. 

## 2011-12-25 ENCOUNTER — Encounter: Payer: Self-pay | Admitting: Internal Medicine

## 2011-12-25 ENCOUNTER — Ambulatory Visit (INDEPENDENT_AMBULATORY_CARE_PROVIDER_SITE_OTHER): Payer: Medicare Other | Admitting: Internal Medicine

## 2011-12-25 VITALS — BP 113/73 | HR 81 | Temp 98.4°F | Resp 16 | Wt 187.5 lb

## 2011-12-25 DIAGNOSIS — F419 Anxiety disorder, unspecified: Secondary | ICD-10-CM

## 2011-12-25 DIAGNOSIS — G4700 Insomnia, unspecified: Secondary | ICD-10-CM

## 2011-12-25 DIAGNOSIS — F411 Generalized anxiety disorder: Secondary | ICD-10-CM

## 2011-12-25 DIAGNOSIS — G8929 Other chronic pain: Secondary | ICD-10-CM

## 2011-12-25 MED ORDER — OXYCODONE-ACETAMINOPHEN 7.5-500 MG PO TABS: 1.0000 | ORAL_TABLET | Freq: Two times a day (BID) | ORAL | Status: DC | PRN

## 2011-12-25 MED ORDER — ZOLPIDEM TARTRATE ER 6.25 MG PO TBCR: 6.2500 mg | EXTENDED_RELEASE_TABLET | Freq: Every evening | ORAL | Status: DC | PRN

## 2011-12-25 MED ORDER — OXYCODONE HCL 10 MG PO TB12: 10.0000 mg | ORAL_TABLET | Freq: Two times a day (BID) | ORAL | Status: DC

## 2011-12-25 MED ORDER — CLONAZEPAM 1 MG PO TABS: 1.0000 mg | ORAL_TABLET | Freq: Three times a day (TID) | ORAL | Status: DC | PRN

## 2011-12-25 NOTE — Assessment & Plan Note (Signed)
Symptoms recently worsen while caring for elderly mother. Will increase Klonopin to 0.5-1 mg up to 3 times daily. Followup in 3 months or sooner if needed.

## 2011-12-25 NOTE — Assessment & Plan Note (Signed)
Secondary to chronic DJD in the thoracic spine with radicular pain. Stable on current medications for over 2 years. We'll continue her current dose. Followup every 3 months while on narcotics.

## 2011-12-25 NOTE — Progress Notes (Signed)
Subjective:    Patient ID: Mark [name], male    DOB: Aug 03, 1956, 56 y.o.   MRN: [id number]  HPI 56 year old male with history of chronic upper back pain secondary to DJD presents for followup. He reports that he is generally doing well. He reports that his pain is well-controlled with current medications. He reports full compliance with this medicine. He has not asked for any early refills.  He is concerned today about some worsening of his anxiety. He currently uses Clonopin 0.5 mg twice daily. For the last several months he has been caring for his mother and he reports significant increase in stress with this. He would like to potentially increase his Clonopin dose to help with symptoms. In the past he is taking Clonopin 3 times daily with improvement in symptoms. He denies any drowsiness or other side effects from this medication.  Outpatient Encounter Prescriptions as of 12/25/2011  Medication Sig Dispense Refill  . aspirin 81 MG tablet Take 81 mg by mouth daily.        Marland Kitchen b complex vitamins tablet Take 1 tablet by mouth daily.        . cetirizine (ZYRTEC) 10 MG tablet Take 10 mg by mouth daily.        . chlorzoxazone (PARAFON) 500 MG tablet Take 1 tablet (500 mg total) by mouth every 6 (six) hours as needed.  180 tablet  3  . Cholecalciferol (VITAMIN D3) 1000 UNITS CAPS Take 1 Units by mouth daily.        . clindamycin (CLEOCIN) 150 MG capsule Take 150 mg by mouth daily. 4 caps 1 hour before dental procedures.       . clonazePAM (KLONOPIN) 1 MG tablet Take 1 tablet (1 mg total) by mouth 3 (three) times daily as needed.  90 tablet  3  . CRANBERRY PO Take 3,600 mg by mouth 2 (two) times daily.        Marland Kitchen desonide (DESOWEN) 0.05 % lotion Apply 1 application topically daily.        . fish oil-omega-3 fatty acids 1000 MG capsule Take 2 g by mouth daily.        Marland Kitchen FLUoxetine (PROZAC) 20 MG capsule TAKE 1 CAPSULE DAILY  90 capsule  3  . FLUoxetine (PROZAC) 40 MG capsule TAKE 1 CAPSULE DAILY  90  capsule  3  . fluticasone (FLONASE) 50 MCG/ACT nasal spray Place 2 sprays into the nose daily.        . furosemide (LASIX) 20 MG tablet TAKE 1 TABLET EVERY MORNING  90 tablet  3  . Garlic 1000 MG CAPS Take 1 capsule by mouth daily.        . Multiple Vitamin (MULTIVITAMIN PO) Take 1 tablet by mouth daily.        . naproxen (NAPROSYN) 500 MG tablet Take 1 tablet (500 mg total) by mouth 2 (two) times daily with a meal.  180 tablet  1  . omeprazole (PRILOSEC) 20 MG capsule TAKE 1 CAPSULE DAILY  90 capsule  3  . oxyCODONE (OXYCONTIN) 10 MG 12 hr tablet Take 1 tablet (10 mg total) by mouth every 12 (twelve) hours.  60 tablet  0  . oxyCODONE-acetaminophen (PERCOCET) 7.5-500 MG per tablet Take 1 tablet by mouth 2 (two) times daily as needed.  60 tablet  0  . zolpidem (AMBIEN CR) 6.25 MG CR tablet Take 1 tablet (6.25 mg total) by mouth at bedtime as needed.  90 tablet  1  Review of Systems  Constitutional: Negative for fever, chills, activity change, appetite change, fatigue and unexpected weight change.  Eyes: Negative for visual disturbance.  Respiratory: Negative for cough and shortness of breath.   Cardiovascular: Negative for chest pain, palpitations and leg swelling.  Gastrointestinal: Negative for abdominal pain and abdominal distention.  Genitourinary: Negative for dysuria, urgency and difficulty urinating.  Musculoskeletal: Positive for myalgias, back pain and arthralgias. Negative for gait problem.  Skin: Negative for color change and rash.  Hematological: Negative for adenopathy.  Psychiatric/Behavioral: Negative for sleep disturbance and dysphoric mood. The patient is nervous/anxious.    BP 113/73  Pulse 81  Temp(Src) 98.4 F (36.9 C) (Oral)  Resp 16  Wt 187 lb 8 oz (85.049 kg)  SpO2 100%     Objective:   Physical Exam  Constitutional: He is oriented to person, place, and time. He appears well-developed and well-nourished. No distress.  HENT:  Head: Normocephalic and  atraumatic.  Right Ear: External ear normal.  Left Ear: External ear normal.  Nose: Nose normal.  Mouth/Throat: Oropharynx is clear and moist. No oropharyngeal exudate.  Eyes: Conjunctivae and EOM are normal. Pupils are equal, round, and reactive to light. Right eye exhibits no discharge. Left eye exhibits no discharge. No scleral icterus.  Neck: Normal range of motion. Neck supple. No tracheal deviation present. No thyromegaly present.  Cardiovascular: Normal rate, regular rhythm and normal heart sounds.  Exam reveals no gallop and no friction rub.   No murmur heard. Pulmonary/Chest: Effort normal and breath sounds normal. No respiratory distress. He has no wheezes. He has no rales. He exhibits no tenderness.  Abdominal: Soft. Bowel sounds are normal. He exhibits no distension and no mass. There is no tenderness. There is no guarding.  Musculoskeletal: He exhibits no edema.       Cervical back: He exhibits decreased range of motion and pain.  Lymphadenopathy:    He has no cervical adenopathy.  Neurological: He is alert and oriented to person, place, and time. No cranial nerve deficit. Coordination normal.  Skin: Skin is warm and dry. No rash noted. He is not diaphoretic. No erythema. No pallor.  Psychiatric: He has a normal mood and affect. His behavior is normal. Judgment and thought content normal.          Assessment & Plan:

## 2012-03-08 ENCOUNTER — Other Ambulatory Visit: Payer: Self-pay | Admitting: *Deleted

## 2012-03-08 MED ORDER — OMEPRAZOLE 20 MG PO CPDR: 20.0000 mg | DELAYED_RELEASE_CAPSULE | Freq: Every day | ORAL | Status: DC

## 2012-03-08 MED ORDER — NAPROXEN 500 MG PO TABS: 500.0000 mg | ORAL_TABLET | Freq: Two times a day (BID) | ORAL | Status: DC

## 2012-03-08 MED ORDER — FUROSEMIDE 20 MG PO TABS: 20.0000 mg | ORAL_TABLET | Freq: Two times a day (BID) | ORAL | Status: DC

## 2012-03-08 MED ORDER — FLUOXETINE HCL 40 MG PO CAPS: 40.0000 mg | ORAL_CAPSULE | Freq: Every day | ORAL | Status: DC

## 2012-03-08 MED ORDER — FLUOXETINE HCL 20 MG PO CAPS: 20.0000 mg | ORAL_CAPSULE | Freq: Every day | ORAL | Status: DC

## 2012-03-08 MED ORDER — FLUTICASONE PROPIONATE 50 MCG/ACT NA SUSP: 2.0000 | Freq: Every day | NASAL | Status: DC

## 2012-03-15 ENCOUNTER — Telehealth: Payer: Self-pay | Admitting: *Deleted

## 2012-03-15 ENCOUNTER — Other Ambulatory Visit (INDEPENDENT_AMBULATORY_CARE_PROVIDER_SITE_OTHER): Payer: Medicare Other | Admitting: *Deleted

## 2012-03-15 DIAGNOSIS — Z0000 Encounter for general adult medical examination without abnormal findings: Secondary | ICD-10-CM

## 2012-03-15 DIAGNOSIS — Z79899 Other long term (current) drug therapy: Secondary | ICD-10-CM

## 2012-03-15 LAB — CBC WITH DIFFERENTIAL/PLATELET
Basophils Absolute: 0 10*3/uL (ref 0.0–0.1)
Basophils Relative: 0.4 % (ref 0.0–3.0)
Eosinophils Absolute: 0.2 10*3/uL (ref 0.0–0.7)
Eosinophils Relative: 2.5 % (ref 0.0–5.0)
HCT: 39.5 % (ref 39.0–52.0)
Hemoglobin: 13.5 g/dL (ref 13.0–17.0)
Lymphocytes Relative: 29 % (ref 12.0–46.0)
Lymphs Abs: 1.9 10*3/uL (ref 0.7–4.0)
MCHC: 34.1 g/dL (ref 30.0–36.0)
MCV: 89.3 fl (ref 78.0–100.0)
Monocytes Absolute: 0.8 10*3/uL (ref 0.1–1.0)
Monocytes Relative: 12.4 % — ABNORMAL HIGH (ref 3.0–12.0)
Neutro Abs: 3.6 10*3/uL (ref 1.4–7.7)
Neutrophils Relative %: 55.7 % (ref 43.0–77.0)
Platelets: 145 10*3/uL — ABNORMAL LOW (ref 150.0–400.0)
RBC: 4.43 Mil/uL (ref 4.22–5.81)
RDW: 13.4 % (ref 11.5–14.6)
WBC: 6.5 10*3/uL (ref 4.5–10.5)

## 2012-03-15 LAB — LIPID PANEL
Cholesterol: 173 mg/dL (ref 0–200)
HDL: 38.4 mg/dL — ABNORMAL LOW (ref >39.00)
Total CHOL/HDL Ratio: 5
Triglycerides: 209 mg/dL — ABNORMAL HIGH (ref 0.0–149.0)
VLDL: 41.8 mg/dL — ABNORMAL HIGH (ref 0.0–40.0)

## 2012-03-15 LAB — COMPREHENSIVE METABOLIC PANEL
ALT: 25 U/L (ref 0–53)
AST: 28 U/L (ref 0–37)
Albumin: 3.9 g/dL (ref 3.5–5.2)
Alkaline Phosphatase: 48 U/L (ref 39–117)
BUN: 17 mg/dL (ref 6–23)
CO2: 29 mEq/L (ref 19–32)
Calcium: 9.2 mg/dL (ref 8.4–10.5)
Chloride: 102 mEq/L (ref 96–112)
Creatinine, Ser: 0.8 mg/dL (ref 0.4–1.5)
GFR: 105.08 mL/min (ref >60.00)
Glucose, Bld: 93 mg/dL (ref 70–99)
Potassium: 3.8 mEq/L (ref 3.5–5.1)
Sodium: 139 mEq/L (ref 135–145)
Total Bilirubin: 1.1 mg/dL (ref 0.3–1.2)
Total Protein: 6.3 g/dL (ref 6.0–8.3)

## 2012-03-15 LAB — LDL CHOLESTEROL, DIRECT: Direct LDL: 105 mg/dL

## 2012-03-15 LAB — HEMOGLOBIN A1C: Hgb A1c MFr Bld: 4.6 % (ref 4.6–6.5)

## 2012-03-15 LAB — TSH: TSH: 2.39 u[IU]/mL (ref 0.35–5.50)

## 2012-03-15 NOTE — Telephone Encounter (Signed)
Labs ordered.

## 2012-03-15 NOTE — Telephone Encounter (Signed)
CBC, CMP, Lipids, A1c, TSH

## 2012-03-15 NOTE — Telephone Encounter (Signed)
Patient came in for labs today, what labs to do you want me to order?

## 2012-03-22 ENCOUNTER — Other Ambulatory Visit: Payer: Medicare Other

## 2012-03-27 ENCOUNTER — Ambulatory Visit (INDEPENDENT_AMBULATORY_CARE_PROVIDER_SITE_OTHER): Payer: Medicare Other | Admitting: Internal Medicine

## 2012-03-27 ENCOUNTER — Encounter: Payer: Self-pay | Admitting: Internal Medicine

## 2012-03-27 VITALS — BP 100/78 | HR 62 | Temp 98.4°F | Ht 67.5 in | Wt 195.0 lb

## 2012-03-27 DIAGNOSIS — G8929 Other chronic pain: Secondary | ICD-10-CM

## 2012-03-27 DIAGNOSIS — E785 Hyperlipidemia, unspecified: Secondary | ICD-10-CM | POA: Insufficient documentation

## 2012-03-27 DIAGNOSIS — D696 Thrombocytopenia, unspecified: Secondary | ICD-10-CM

## 2012-03-27 DIAGNOSIS — E1169 Type 2 diabetes mellitus with other specified complication: Secondary | ICD-10-CM | POA: Insufficient documentation

## 2012-03-27 DIAGNOSIS — G4700 Insomnia, unspecified: Secondary | ICD-10-CM

## 2012-03-27 DIAGNOSIS — Z0000 Encounter for general adult medical examination without abnormal findings: Secondary | ICD-10-CM | POA: Insufficient documentation

## 2012-03-27 MED ORDER — OXYCODONE-ACETAMINOPHEN 7.5-500 MG PO TABS: 1.0000 | ORAL_TABLET | Freq: Two times a day (BID) | ORAL | Status: DC | PRN

## 2012-03-27 MED ORDER — OXYCODONE HCL 10 MG PO TB12: 10.0000 mg | ORAL_TABLET | Freq: Two times a day (BID) | ORAL | Status: DC

## 2012-03-27 NOTE — Assessment & Plan Note (Signed)
Secondary to chronic degenerative joint disease in the thoracic spine. Stable on current medications. We'll continue current doses of OxyContin and Percocet. Followup every 3 months while on narcotics.

## 2012-03-27 NOTE — Progress Notes (Signed)
Subjective:    Patient ID: Mark [name], male    DOB: 11-06-1955, 56 y.o.   MRN: [id number]  HPI The patient is here for annual Medicare wellness examination and management of other chronic and acute problems.   The risk factors are reflected in the social history.  The roster of all physicians providing medical care to patient - is listed in the Snapshot section of the chart.  Activities of daily living:  The patient is 100% independent in all ADLs: dressing, toileting, feeding as well as independent mobility  Home safety : The patient has smoke detectors in the home. They wear seatbelts.  There are no firearms at home. There is no violence in the home.   There is no risks for hepatitis, STDs or HIV. There is no history of blood transfusion. They have no travel history to infectious disease endemic areas of the world.  The patient has seen their dentist in the last six month. (Dr. [place]) They have seen their eye doctor in the last year.  No issues with hearing.  They have deferred audiologic testing in the last year.  Previously seen by Dr. Andee Poles in ENT for hearing issue, but this has resolved.  They do not have excessive sun exposure. Discussed the need for sun protection: hats, long sleeves and use of sunscreen if there is significant sun exposure.   Diet: the importance of a healthy diet is discussed. They do have a relatively healthy diet.  The benefits of regular aerobic exercise were discussed.  In process of moving out of home, so physically active. Limited by chronic pain issues.  Depression screen: there are no signs or vegative symptoms of depression- irritability, change in appetite, anhedonia, sadness/tearfullness.  Cognitive assessment: the patient manages all their financial and personal affairs and is actively engaged. They could relate day,date,year and events.  The following portions of the patient's history were reviewed and updated as appropriate: allergies,  current medications, past family history, past medical history,  past surgical history, past social history  and problem list.  Visual acuity was not assessed per patient preference since he has regular follow up with her ophthalmologist. Hearing and body mass index were assessed and reviewed.   During the course of the visit the patient was educated and counseled about appropriate screening and preventive services including : fall prevention , diabetes screening, nutrition counseling, colorectal cancer screening, and recommended immunizations.    In regards to chronic pain and thoracic spine secondary to degenerative joint disease, patient reports good control with OxyContin and when necessary Percocet. He has been stable on current regimen for over 2 years. He has not had side effects such as constipation or oversedation.  Outpatient Encounter Prescriptions as of 03/27/2012  Medication Sig Dispense Refill  . aspirin 81 MG tablet Take 81 mg by mouth daily.        Marland Kitchen b complex vitamins tablet Take 1 tablet by mouth daily.        . cetirizine (ZYRTEC) 10 MG tablet Take 10 mg by mouth daily.        . chlorzoxazone (PARAFON) 500 MG tablet Take 1 tablet (500 mg total) by mouth every 6 (six) hours as needed.  180 tablet  3  . Cholecalciferol (VITAMIN D3) 1000 UNITS CAPS Take 1 Units by mouth daily.        . clindamycin (CLEOCIN) 150 MG capsule Take 150 mg by mouth daily. 4 caps 1 hour before dental procedures.       Marland Kitchen  clonazePAM (KLONOPIN) 1 MG tablet Take 1 tablet (1 mg total) by mouth 3 (three) times daily as needed.  90 tablet  3  . CRANBERRY PO Take 3,600 mg by mouth 2 (two) times daily.        Marland Kitchen desonide (DESOWEN) 0.05 % lotion Apply 1 application topically daily.        . fish oil-omega-3 fatty acids 1000 MG capsule Take 2 g by mouth daily.        Marland Kitchen FLUoxetine (PROZAC) 20 MG capsule Take 1 capsule (20 mg total) by mouth daily.  90 capsule  3  . FLUoxetine (PROZAC) 40 MG capsule Take 1 capsule (40  mg total) by mouth daily.  90 capsule  3  . fluticasone (FLONASE) 50 MCG/ACT nasal spray Place 2 sprays into the nose daily.  16 g  4  . furosemide (LASIX) 20 MG tablet Take 1 tablet (20 mg total) by mouth 2 (two) times daily.  90 tablet  3  . Garlic 1000 MG CAPS Take 1 capsule by mouth daily.        . Multiple Vitamin (MULTIVITAMIN PO) Take 1 tablet by mouth daily.        . naproxen (NAPROSYN) 500 MG tablet Take 1 tablet (500 mg total) by mouth 2 (two) times daily with a meal.  180 tablet  3  . omeprazole (PRILOSEC) 20 MG capsule Take 1 capsule (20 mg total) by mouth daily.  90 capsule  3  . oxyCODONE (OXYCONTIN) 10 MG 12 hr tablet Take 1 tablet (10 mg total) by mouth every 12 (twelve) hours.  60 tablet  0  . oxyCODONE-acetaminophen (PERCOCET) 7.5-500 MG per tablet Take 1 tablet by mouth 2 (two) times daily as needed.  60 tablet  0  . zolpidem (AMBIEN CR) 6.25 MG CR tablet Take 1 tablet (6.25 mg total) by mouth at bedtime as needed.  90 tablet  1    Review of Systems  Constitutional: Negative for fever, chills, activity change, appetite change, fatigue and unexpected weight change.  Eyes: Negative for visual disturbance.  Respiratory: Negative for cough and shortness of breath.   Cardiovascular: Negative for chest pain, palpitations and leg swelling.  Gastrointestinal: Negative for abdominal pain and abdominal distention.  Genitourinary: Negative for dysuria, urgency and difficulty urinating.  Musculoskeletal: Positive for myalgias, back pain and arthralgias. Negative for gait problem.  Skin: Negative for color change and rash.  Hematological: Negative for adenopathy.  Psychiatric/Behavioral: Negative for disturbed wake/sleep cycle and dysphoric mood. The patient is not nervous/anxious.        Objective:   Physical Exam  Constitutional: He is oriented to person, place, and time. He appears well-developed and well-nourished. No distress.  HENT:  Head: Normocephalic and atraumatic.    Right Ear: External ear normal.  Left Ear: External ear normal.  Nose: Nose normal.  Mouth/Throat: Oropharynx is clear and moist. No oropharyngeal exudate.  Eyes: Conjunctivae and EOM are normal. Pupils are equal, round, and reactive to light. Right eye exhibits no discharge. Left eye exhibits no discharge. No scleral icterus.  Neck: Normal range of motion. Neck supple. No tracheal deviation present. No thyromegaly present.  Cardiovascular: Normal rate, regular rhythm and normal heart sounds.  Exam reveals no gallop and no friction rub.   No murmur heard. Pulmonary/Chest: Effort normal and breath sounds normal. No respiratory distress. He has no wheezes. He has no rales. He exhibits no tenderness.  Abdominal: Soft. Bowel sounds are normal. He exhibits no distension and  no mass. There is no tenderness. There is no guarding.  Musculoskeletal: Normal range of motion. He exhibits no edema.  Lymphadenopathy:    He has no cervical adenopathy.  Neurological: He is alert and oriented to person, place, and time. No cranial nerve deficit. Coordination normal.  Skin: Skin is warm and dry. No rash noted. He is not diaphoretic. No erythema. No pallor.  Psychiatric: He has a normal mood and affect. His behavior is normal. Judgment and thought content normal.          Assessment & Plan:

## 2012-03-27 NOTE — Assessment & Plan Note (Signed)
General exam today normal. Patient is up-to-date on health maintenance. Appropriate screening performed. Reviewed recent lab work including CBC, CMP, and lipids. Followup in 3 months.

## 2012-03-27 NOTE — Assessment & Plan Note (Signed)
Platelet count is slightly low on recent labs at 145. No evidence of bleeding. We'll plan to repeat CBC with labs in 3 months.

## 2012-03-27 NOTE — Assessment & Plan Note (Signed)
Reviewed recent labs which show slightly elevated triglycerides but otherwise normal lipids. Encourage continued efforts at healthy diet and regular physical activity.

## 2012-03-27 NOTE — Assessment & Plan Note (Signed)
Symptoms fairly well-controlled with Ambien 6.25 mg nightly. We discussed potentially increasing the dose to 12.5 mg nightly but patient is reluctant given risk of sedation. He will call if any persistent insomnia.

## 2012-05-28 ENCOUNTER — Ambulatory Visit: Payer: Self-pay | Admitting: Urology

## 2012-05-28 DIAGNOSIS — N200 Calculus of kidney: Secondary | ICD-10-CM | POA: Insufficient documentation

## 2012-05-28 DIAGNOSIS — N401 Enlarged prostate with lower urinary tract symptoms: Secondary | ICD-10-CM | POA: Insufficient documentation

## 2012-05-28 DIAGNOSIS — N138 Other obstructive and reflux uropathy: Secondary | ICD-10-CM | POA: Insufficient documentation

## 2012-06-28 ENCOUNTER — Other Ambulatory Visit (INDEPENDENT_AMBULATORY_CARE_PROVIDER_SITE_OTHER): Payer: Medicare Other

## 2012-06-28 DIAGNOSIS — E785 Hyperlipidemia, unspecified: Secondary | ICD-10-CM

## 2012-06-28 DIAGNOSIS — D696 Thrombocytopenia, unspecified: Secondary | ICD-10-CM

## 2012-06-28 LAB — CBC WITH DIFFERENTIAL/PLATELET
Basophils Absolute: 0.1 10*3/uL (ref 0.0–0.1)
Basophils Relative: 0.8 % (ref 0.0–3.0)
Eosinophils Absolute: 0.2 10*3/uL (ref 0.0–0.7)
Eosinophils Relative: 2.2 % (ref 0.0–5.0)
HCT: 46.5 % (ref 39.0–52.0)
Hemoglobin: 15.8 g/dL (ref 13.0–17.0)
Lymphocytes Relative: 27 % (ref 12.0–46.0)
Lymphs Abs: 2.1 10*3/uL (ref 0.7–4.0)
MCHC: 33.8 g/dL (ref 30.0–36.0)
MCV: 90.2 fl (ref 78.0–100.0)
Monocytes Absolute: 0.8 10*3/uL (ref 0.1–1.0)
Monocytes Relative: 9.9 % (ref 3.0–12.0)
Neutro Abs: 4.6 10*3/uL (ref 1.4–7.7)
Neutrophils Relative %: 60.1 % (ref 43.0–77.0)
Platelets: 171 10*3/uL (ref 150.0–400.0)
RBC: 5.16 Mil/uL (ref 4.22–5.81)
RDW: 12.7 % (ref 11.5–14.6)
WBC: 7.7 10*3/uL (ref 4.5–10.5)

## 2012-06-28 LAB — COMPREHENSIVE METABOLIC PANEL
ALT: 30 U/L (ref 0–53)
AST: 35 U/L (ref 0–37)
Albumin: 4.1 g/dL (ref 3.5–5.2)
Alkaline Phosphatase: 45 U/L (ref 39–117)
BUN: 17 mg/dL (ref 6–23)
CO2: 29 mEq/L (ref 19–32)
Calcium: 8.8 mg/dL (ref 8.4–10.5)
Chloride: 101 mEq/L (ref 96–112)
Creatinine, Ser: 0.8 mg/dL (ref 0.4–1.5)
GFR: 102.06 mL/min (ref >60.00)
Glucose, Bld: 106 mg/dL — ABNORMAL HIGH (ref 70–99)
Potassium: 4.2 mEq/L (ref 3.5–5.1)
Sodium: 137 mEq/L (ref 135–145)
Total Bilirubin: 0.9 mg/dL (ref 0.3–1.2)
Total Protein: 6.8 g/dL (ref 6.0–8.3)

## 2012-07-03 ENCOUNTER — Ambulatory Visit: Payer: Medicare Other | Admitting: Internal Medicine

## 2012-07-12 ENCOUNTER — Other Ambulatory Visit: Payer: Self-pay | Admitting: Internal Medicine

## 2012-07-12 NOTE — Telephone Encounter (Signed)
Med filled.  

## 2012-07-23 ENCOUNTER — Ambulatory Visit (INDEPENDENT_AMBULATORY_CARE_PROVIDER_SITE_OTHER): Payer: Medicare Other | Admitting: Internal Medicine

## 2012-07-23 ENCOUNTER — Encounter: Payer: Self-pay | Admitting: Internal Medicine

## 2012-07-23 VITALS — BP 108/78 | HR 65 | Temp 97.7°F | Resp 16 | Wt 202.5 lb

## 2012-07-23 DIAGNOSIS — G8929 Other chronic pain: Secondary | ICD-10-CM

## 2012-07-23 DIAGNOSIS — F411 Generalized anxiety disorder: Secondary | ICD-10-CM

## 2012-07-23 DIAGNOSIS — G4700 Insomnia, unspecified: Secondary | ICD-10-CM

## 2012-07-23 DIAGNOSIS — D696 Thrombocytopenia, unspecified: Secondary | ICD-10-CM

## 2012-07-23 DIAGNOSIS — F419 Anxiety disorder, unspecified: Secondary | ICD-10-CM

## 2012-07-23 DIAGNOSIS — E785 Hyperlipidemia, unspecified: Secondary | ICD-10-CM

## 2012-07-23 MED ORDER — OXYCODONE-ACETAMINOPHEN 7.5-500 MG PO TABS: 1.0000 | ORAL_TABLET | Freq: Two times a day (BID) | ORAL | Status: DC | PRN

## 2012-07-23 MED ORDER — ZOLPIDEM TARTRATE ER 12.5 MG PO TBCR: 12.5000 mg | EXTENDED_RELEASE_TABLET | Freq: Every evening | ORAL | Status: DC | PRN

## 2012-07-23 MED ORDER — OXYCODONE HCL 10 MG PO TB12: 10.0000 mg | ORAL_TABLET | Freq: Two times a day (BID) | ORAL | Status: DC

## 2012-07-23 MED ORDER — CLONAZEPAM 1 MG PO TABS: 1.0000 mg | ORAL_TABLET | Freq: Three times a day (TID) | ORAL | Status: DC | PRN

## 2012-07-23 NOTE — Assessment & Plan Note (Signed)
Recent labs show platelets have normalized. Suspect transient low platelets secondary to viral infection. Will plan to recheck at visit in 3 months.

## 2012-07-23 NOTE — Assessment & Plan Note (Signed)
Triglycerides noted to be elevated on recent labs. Encouraged Mediterranean style diet and physical activity such as walking with a goal of 40 minutes 3 times per week. Will recheck with labs in 3 months.

## 2012-07-23 NOTE — Assessment & Plan Note (Addendum)
Secondary to DJD in thoracic spine with nerve compression. Failed invasive treatment including steroid injection. Symptoms well controlled with current medications. Pain contract in place. Pt stable on meds for >3 years with no issues. Will refill x 3 months today. Follow up 3 months and prn.

## 2012-07-23 NOTE — Assessment & Plan Note (Signed)
Having some early waking on current dose of Ambien. Will increase to 12.5mg  daily. Follow up 3 months and prn.

## 2012-07-23 NOTE — Progress Notes (Signed)
Subjective:    Patient ID: Mark [name], male    DOB: 1956/02/24, 56 y.o.   MRN: [id number]  HPI 56 year old male with history of chronic pain secondary to degenerative joint disease in the thoracic spine, anxiety, insomnia presents for followup. He reports that it has been a difficult time for him. 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 He reports increase in anxiety. He is also having difficulty sleeping. He reports that he is taking Ambien 6.25 mg at bedtime but often wakes throughout the night. He then feels exhausted during the day. In regards to chronic pain, he reports that symptoms have been well controlled with OxyContin twice daily and Percocet as needed for breakthrough pain. He has been stable on this medication regimen for several years.  Outpatient Encounter Prescriptions as of 07/23/2012  Medication Sig Dispense Refill  . aspirin 81 MG tablet Take 81 mg by mouth daily.        Marland Kitchen b complex vitamins tablet Take 1 tablet by mouth daily.        . cetirizine (ZYRTEC) 10 MG tablet Take 10 mg by mouth daily.        . chlorzoxazone (PARAFON) 500 MG tablet Take 1 tablet (500 mg total) by mouth every 6 (six) hours as needed.  180 tablet  3  . Cholecalciferol (VITAMIN D3) 1000 UNITS CAPS Take 1 Units by mouth daily.        . clindamycin (CLEOCIN) 150 MG capsule Take 150 mg by mouth daily. 4 caps 1 hour before dental procedures.       . clonazePAM (KLONOPIN) 1 MG tablet Take 1 tablet (1 mg total) by mouth 3 (three) times daily as needed for anxiety.  90 tablet  1  . CRANBERRY PO Take 4,200 mg by mouth 2 (two) times daily.       Marland Kitchen desonide (DESOWEN) 0.05 % lotion Apply 1 application topically daily.        Marland Kitchen FLUoxetine (PROZAC) 20 MG capsule Take 1 capsule (20 mg total) by mouth daily.  90 capsule  3  . FLUoxetine (PROZAC) 40 MG capsule Take 1 capsule (40 mg total) by mouth daily.  90 capsule  3  . fluticasone (FLONASE) 50 MCG/ACT nasal spray Place 2 sprays into the nose daily.   16 g  4  . furosemide (LASIX) 20 MG tablet Take 1 tablet (20 mg total) by mouth 2 (two) times daily.  90 tablet  3  . Garlic 1000 MG CAPS Take 1 capsule by mouth daily.        . Multiple Vitamin (MULTIVITAMIN PO) Take 1 tablet by mouth daily.        . naproxen (NAPROSYN) 500 MG tablet Take 1 tablet (500 mg total) by mouth 2 (two) times daily with a meal.  180 tablet  3  . Omega-3 Fatty Acids (FISH OIL) 1200 MG CAPS Take 1,200 mg by mouth daily.      Marland Kitchen omeprazole (PRILOSEC) 20 MG capsule Take 1 capsule (20 mg total) by mouth daily.  90 capsule  3  . oxyCODONE (OXYCONTIN) 10 MG 12 hr tablet Take 1 tablet (10 mg total) by mouth every 12 (twelve) hours.  60 tablet  0  . oxyCODONE-acetaminophen (PERCOCET) 7.5-500 MG per tablet Take 1 tablet by mouth 2 (two) times daily as needed.  60 tablet  0  . zolpidem (AMBIEN CR) 12.5 MG CR tablet Take 1 tablet (12.5 mg total) by mouth at bedtime as needed.  90 tablet  1   BP 108/78  Pulse 65  Temp 97.7 F (36.5 C) (Oral)  Resp 16  Wt 202 lb 8 oz (91.853 kg)  Review of Systems  Constitutional: Negative for fever, chills, activity change, appetite change, fatigue and unexpected weight change.  Eyes: Negative for visual disturbance.  Respiratory: Negative for cough and shortness of breath.   Cardiovascular: Negative for chest pain, palpitations and leg swelling.  Gastrointestinal: Negative for abdominal pain and abdominal distention.  Genitourinary: Negative for dysuria, urgency and difficulty urinating.  Musculoskeletal: Positive for myalgias, back pain and arthralgias. Negative for gait problem.  Skin: Negative for color change and rash.  Hematological: Negative for adenopathy.  Psychiatric/Behavioral: Positive for sleep disturbance. Negative for dysphoric mood. The patient is nervous/anxious.        Objective:   Physical Exam  Constitutional: He is oriented to person, place, and time. He appears well-developed and well-nourished. No distress.   HENT:  Head: Normocephalic and atraumatic.  Right Ear: External ear normal.  Left Ear: External ear normal.  Nose: Nose normal.  Mouth/Throat: Oropharynx is clear and moist. No oropharyngeal exudate.  Eyes: Conjunctivae normal and EOM are normal. Pupils are equal, round, and reactive to light. Right eye exhibits no discharge. Left eye exhibits no discharge. No scleral icterus.  Neck: Normal range of motion. Neck supple. No tracheal deviation present. No thyromegaly present.  Cardiovascular: Normal rate, regular rhythm and normal heart sounds.  Exam reveals no gallop and no friction rub.   No murmur heard. Pulmonary/Chest: Effort normal and breath sounds normal. No respiratory distress. He has no wheezes. He has no rales. He exhibits no tenderness.  Musculoskeletal: He exhibits no edema.       Cervical back: He exhibits decreased range of motion, tenderness and pain.  Lymphadenopathy:    He has no cervical adenopathy.  Neurological: He is alert and oriented to person, place, and time. No cranial nerve deficit. Coordination normal.  Skin: Skin is warm and dry. No rash noted. He is not diaphoretic. No erythema. No pallor.  Psychiatric: He has a normal mood and affect. His behavior is normal. Judgment and thought content normal.          Assessment & Plan:

## 2012-07-23 NOTE — Assessment & Plan Note (Signed)
Symptoms recently worsening, as pt provides care for elderly mother. Will increase Clonazepam to 1mg  tid prn. Followup 3 months and prn.

## 2012-08-07 HISTORY — PX: CARDIOVASCULAR STRESS TEST: SHX262

## 2012-09-21 ENCOUNTER — Other Ambulatory Visit: Payer: Self-pay

## 2012-10-21 ENCOUNTER — Telehealth: Payer: Self-pay | Admitting: Emergency Medicine

## 2012-10-21 ENCOUNTER — Encounter: Payer: Self-pay | Admitting: Internal Medicine

## 2012-10-21 ENCOUNTER — Ambulatory Visit (INDEPENDENT_AMBULATORY_CARE_PROVIDER_SITE_OTHER): Payer: Medicare Other | Admitting: Internal Medicine

## 2012-10-21 VITALS — BP 138/84 | HR 64 | Temp 98.3°F | Wt 210.0 lb

## 2012-10-21 DIAGNOSIS — M25519 Pain in unspecified shoulder: Secondary | ICD-10-CM

## 2012-10-21 DIAGNOSIS — G8929 Other chronic pain: Secondary | ICD-10-CM

## 2012-10-21 DIAGNOSIS — G4700 Insomnia, unspecified: Secondary | ICD-10-CM

## 2012-10-21 DIAGNOSIS — M25512 Pain in left shoulder: Secondary | ICD-10-CM | POA: Insufficient documentation

## 2012-10-21 MED ORDER — OXYCODONE-ACETAMINOPHEN 7.5-325 MG PO TABS: 1.0000 | ORAL_TABLET | Freq: Three times a day (TID) | ORAL | Status: DC | PRN

## 2012-10-21 MED ORDER — OXYCODONE HCL 10 MG PO TB12: 10.0000 mg | ORAL_TABLET | Freq: Two times a day (BID) | ORAL | Status: DC

## 2012-10-21 MED ORDER — ZOLPIDEM TARTRATE ER 12.5 MG PO TBCR: 12.5000 mg | EXTENDED_RELEASE_TABLET | Freq: Every evening | ORAL | Status: DC | PRN

## 2012-10-21 MED ORDER — OXYCODONE-ACETAMINOPHEN 7.5-500 MG PO TABS: 1.0000 | ORAL_TABLET | Freq: Two times a day (BID) | ORAL | Status: DC | PRN

## 2012-10-21 NOTE — Assessment & Plan Note (Signed)
Symptoms improved with Ambien. Will continue. 

## 2012-10-21 NOTE — Progress Notes (Signed)
Subjective:    Patient ID: Mark [name], male    DOB: 1955/08/15, 57 y.o.   MRN: [id number]  HPI 57YO male with chronic upper back pain secondary to DJD, depression, h/o complete heart blood s/p pacemaker present for follow up. Doing generally well. Compliant with pain medications. No overuse or early refills. Notes some recent worsening of left and right shoulder pain. Had rotator cuff repair on left shoulder in distant past with Dr. Gerrit Heck. Now notes some posterior left shoulder pain over last few weeks. Questions if he may have re-injured this area recently when transferring his elderly mother. Pain described as aching in left posterior shoulder. Occasionally sharp. Made worse with movement. No weakness of UE noted.  Outpatient Encounter Prescriptions as of 10/21/2012  Medication Sig Dispense Refill  . aspirin 81 MG tablet Take 81 mg by mouth daily.        Marland Kitchen b complex vitamins tablet Take 1 tablet by mouth daily.        . cetirizine (ZYRTEC) 10 MG tablet Take 10 mg by mouth daily.        . chlorzoxazone (PARAFON) 500 MG tablet Take 1 tablet (500 mg total) by mouth every 6 (six) hours as needed.  180 tablet  3  . Cholecalciferol (VITAMIN D3) 1000 UNITS CAPS Take 1 Units by mouth daily.        . clindamycin (CLEOCIN) 150 MG capsule Take 150 mg by mouth daily. 4 caps 1 hour before dental procedures.       . clonazePAM (KLONOPIN) 1 MG tablet Take 1 tablet (1 mg total) by mouth 3 (three) times daily as needed for anxiety.  90 tablet  1  . CRANBERRY PO Take 4,200 mg by mouth 2 (two) times daily.       Marland Kitchen desonide (DESOWEN) 0.05 % lotion Apply 1 application topically daily.        Marland Kitchen FLUoxetine (PROZAC) 20 MG capsule Take 1 capsule (20 mg total) by mouth daily.  90 capsule  3  . FLUoxetine (PROZAC) 40 MG capsule Take 1 capsule (40 mg total) by mouth daily.  90 capsule  3  . fluticasone (FLONASE) 50 MCG/ACT nasal spray Place 2 sprays into the nose daily.  16 g  4  . furosemide (LASIX) 20 MG tablet Take  1 tablet (20 mg total) by mouth 2 (two) times daily.  90 tablet  3  . Garlic 1000 MG CAPS Take 1 capsule by mouth daily.        . Multiple Vitamin (MULTIVITAMIN PO) Take 1 tablet by mouth daily.        . naproxen (NAPROSYN) 500 MG tablet Take 1 tablet (500 mg total) by mouth 2 (two) times daily with a meal.  180 tablet  3  . Omega-3 Fatty Acids (FISH OIL) 1200 MG CAPS Take 1,200 mg by mouth daily.      Marland Kitchen omeprazole (PRILOSEC) 20 MG capsule Take 1 capsule (20 mg total) by mouth daily.  90 capsule  3  . oxyCODONE (OXYCONTIN) 10 MG 12 hr tablet Take 1 tablet (10 mg total) by mouth every 12 (twelve) hours.  60 tablet  0  . zolpidem (AMBIEN CR) 12.5 MG CR tablet Take 1 tablet (12.5 mg total) by mouth at bedtime as needed.  90 tablet  1   No facility-administered encounter medications on file as of 10/21/2012.   BP 138/84  Pulse 64  Temp(Src) 98.3 F (36.8 C) (Oral)  Wt 210 lb (95.255 kg)  BMI 32.39 kg/m2  SpO2 95%  Review of Systems  Constitutional: Negative for fever, chills, activity change, appetite change, fatigue and unexpected weight change.  Eyes: Negative for visual disturbance.  Respiratory: Negative for cough and shortness of breath.   Cardiovascular: Negative for chest pain, palpitations and leg swelling.  Gastrointestinal: Negative for abdominal pain and abdominal distention.  Genitourinary: Negative for dysuria, urgency and difficulty urinating.  Musculoskeletal: Positive for myalgias, back pain and arthralgias. Negative for gait problem.  Skin: Negative for color change and rash.  Hematological: Negative for adenopathy.  Psychiatric/Behavioral: Negative for sleep disturbance and dysphoric mood. The patient is not nervous/anxious.        Objective:   Physical Exam  Constitutional: He is oriented to person, place, and time. He appears well-developed and well-nourished. No distress.  HENT:  Head: Normocephalic and atraumatic.  Right Ear: External ear normal.  Left Ear:  External ear normal.  Nose: Nose normal.  Mouth/Throat: Oropharynx is clear and moist. No oropharyngeal exudate.  Eyes: Conjunctivae and EOM are normal. Pupils are equal, round, and reactive to light. Right eye exhibits no discharge. Left eye exhibits no discharge. No scleral icterus.  Neck: Normal range of motion. Neck supple. No tracheal deviation present. No thyromegaly present.  Cardiovascular: Normal rate, regular rhythm and normal heart sounds.  Exam reveals no gallop and no friction rub.   No murmur heard. Pulmonary/Chest: Effort normal and breath sounds normal. No respiratory distress. He has no wheezes. He has no rales. He exhibits no tenderness.  Musculoskeletal: Normal range of motion. He exhibits no edema.       Right shoulder: He exhibits pain. He exhibits no tenderness and no bony tenderness.       Left shoulder: He exhibits pain. He exhibits normal range of motion, no tenderness and no bony tenderness.       Cervical back: He exhibits pain. He exhibits no tenderness and no bony tenderness.  Lymphadenopathy:    He has no cervical adenopathy.  Neurological: He is alert and oriented to person, place, and time. No cranial nerve deficit. Coordination normal.  Skin: Skin is warm and dry. No rash noted. He is not diaphoretic. No erythema. No pallor.  Psychiatric: He has a normal mood and affect. His behavior is normal. Judgment and thought content normal.          Assessment & Plan:

## 2012-10-21 NOTE — Assessment & Plan Note (Signed)
Secondary to DJD in thoracic spine with nerve compression. Failed invasive treatment including steroid injection. Symptoms well controlled with current medications. Pain contract in place. Pt stable on meds for >3 years with no issues. Will refill x 3 months today (note decrease in Tylenol component on Percocet per new FDA guidelines). Follow up 3 months and prn.

## 2012-10-21 NOTE — Assessment & Plan Note (Signed)
Bilateral shoulder pain left>right. S/p left rotator cuff repair in the past with Dr. Gerrit Heck. Question repeat rotator cuff injury leading to recent worsening of symptoms. Unable to have MRI evaluation because of pacemaker. Question if CT would be helpful. Will set up ortho evaluation. Continue pain medications as directed.

## 2012-12-20 ENCOUNTER — Other Ambulatory Visit: Payer: Self-pay | Admitting: *Deleted

## 2012-12-20 MED ORDER — CHLORZOXAZONE 500 MG PO TABS: 500.0000 mg | ORAL_TABLET | Freq: Four times a day (QID) | ORAL | Status: DC | PRN

## 2012-12-25 ENCOUNTER — Other Ambulatory Visit: Payer: Self-pay | Admitting: *Deleted

## 2012-12-25 MED ORDER — CHLORZOXAZONE 500 MG PO TABS: 500.0000 mg | ORAL_TABLET | Freq: Four times a day (QID) | ORAL | Status: DC | PRN

## 2012-12-25 NOTE — Telephone Encounter (Signed)
Eprescribed.

## 2012-12-31 ENCOUNTER — Telehealth: Payer: Self-pay | Admitting: Internal Medicine

## 2012-12-31 NOTE — Telephone Encounter (Signed)
error 

## 2012-12-31 NOTE — Telephone Encounter (Signed)
chlorzoxazone (PARAFON) 500 MG tablet  Patient needing a prior authorization for this medication

## 2013-01-01 NOTE — Telephone Encounter (Signed)
Received PA criteria questions, placed them in Dr. Tilman Neat folder

## 2013-01-02 NOTE — Telephone Encounter (Signed)
This medication has been approved, Mark [name] left message on voicemail stating it has been approved from 01/01/13-01/01/14. The patient and office should receive approval letter.

## 2013-01-07 ENCOUNTER — Other Ambulatory Visit: Payer: Self-pay | Admitting: *Deleted

## 2013-01-07 DIAGNOSIS — F419 Anxiety disorder, unspecified: Secondary | ICD-10-CM

## 2013-01-07 MED ORDER — CLONAZEPAM 1 MG PO TABS: 1.0000 mg | ORAL_TABLET | Freq: Three times a day (TID) | ORAL | Status: DC | PRN

## 2013-01-07 NOTE — Telephone Encounter (Signed)
Requesting rx for mail order pharmacy.

## 2013-01-08 MED ORDER — CLONAZEPAM 1 MG PO TABS: 1.0000 mg | ORAL_TABLET | Freq: Three times a day (TID) | ORAL | Status: DC | PRN

## 2013-01-08 NOTE — Telephone Encounter (Signed)
Please review, as this was sent to me.

## 2013-01-08 NOTE — Telephone Encounter (Signed)
Rx faxed to pharmacy  

## 2013-01-08 NOTE — Addendum Note (Signed)
Addended by: Chandra Batch E on: 01/08/2013 12:53 PM   Modules accepted: Orders

## 2013-01-20 ENCOUNTER — Encounter: Payer: Self-pay | Admitting: Internal Medicine

## 2013-01-20 ENCOUNTER — Ambulatory Visit (INDEPENDENT_AMBULATORY_CARE_PROVIDER_SITE_OTHER): Payer: Medicare Other | Admitting: Internal Medicine

## 2013-01-20 VITALS — BP 132/86 | HR 69 | Temp 98.3°F | Wt 216.0 lb

## 2013-01-20 DIAGNOSIS — G8929 Other chronic pain: Secondary | ICD-10-CM

## 2013-01-20 DIAGNOSIS — G4700 Insomnia, unspecified: Secondary | ICD-10-CM

## 2013-01-20 MED ORDER — OXYCODONE-ACETAMINOPHEN 7.5-325 MG PO TABS: 1.0000 | ORAL_TABLET | Freq: Three times a day (TID) | ORAL | Status: DC | PRN

## 2013-01-20 MED ORDER — ESZOPICLONE 2 MG PO TABS: 2.0000 mg | ORAL_TABLET | Freq: Every day | ORAL | Status: DC

## 2013-01-20 MED ORDER — OXYCODONE HCL 10 MG PO TB12: 10.0000 mg | ORAL_TABLET | Freq: Two times a day (BID) | ORAL | Status: DC

## 2013-01-20 NOTE — Assessment & Plan Note (Signed)
Chronic pain in neck and upper back secondary to degenerative joint disease. Failed invasive treatment including steroid injection. Symptoms well-controlled with current medications. Pain contract in place. Stable on medications for over 3 years with no issues with compliance. Will refill x3 months today. Followup in 3 months and as needed.

## 2013-01-20 NOTE — Assessment & Plan Note (Signed)
Persistent insomnia despite use of Ambien. Will try changing to [facility]. Encouraged him to be more active during the daytime. If no improvement on Lunesta, would favor referral to sleep medicine.

## 2013-01-20 NOTE — Progress Notes (Signed)
Subjective:    Patient ID: Mark [name], male    DOB: 1956/02/17, 57 y.o.   MRN: [id number]  HPI 57 year old male with history of chronic neck and upper back pain secondary to degenerative joint disease, depression, insomnia presents for followup. He reports that symptoms of upper back pain have been persistent. Pain symptoms are currently fairly well-controlled with use of OxyContin and Percocet as needed. He denies any side effects from these medications.  He is concerned about ongoing issues with sleeping. He has difficulty both falling asleep and staying asleep. He has been taking Ambien 12.5 mg daily with no improvement. He has also been taking over-the-counter Unisom or Benadryl. He notes increased stress recently in caring for his elderly mother. He questions whether this may be contributing.  Outpatient Prescriptions Prior to Visit  Medication Sig Dispense Refill  . aspirin 81 MG tablet Take 81 mg by mouth daily.        Marland Kitchen b complex vitamins tablet Take 1 tablet by mouth daily.        . cetirizine (ZYRTEC) 10 MG tablet Take 10 mg by mouth daily.        . chlorzoxazone (PARAFON) 500 MG tablet Take 1 tablet (500 mg total) by mouth every 6 (six) hours as needed.  180 tablet  1  . Cholecalciferol (VITAMIN D3) 1000 UNITS CAPS Take 1 Units by mouth daily.        . clindamycin (CLEOCIN) 150 MG capsule Take 150 mg by mouth daily. 4 caps 1 hour before dental procedures.       . clonazePAM (KLONOPIN) 1 MG tablet Take 1 tablet (1 mg total) by mouth 3 (three) times daily as needed for anxiety.  90 tablet  1  . CRANBERRY PO Take 4,200 mg by mouth 2 (two) times daily.       Marland Kitchen desonide (DESOWEN) 0.05 % lotion Apply 1 application topically daily.        Marland Kitchen FLUoxetine (PROZAC) 20 MG capsule Take 1 capsule (20 mg total) by mouth daily.  90 capsule  3  . FLUoxetine (PROZAC) 40 MG capsule Take 1 capsule (40 mg total) by mouth daily.  90 capsule  3  . fluticasone (FLONASE) 50 MCG/ACT nasal spray Place 2  sprays into the nose daily.  16 g  4  . furosemide (LASIX) 20 MG tablet Take 1 tablet (20 mg total) by mouth 2 (two) times daily.  90 tablet  3  . Garlic 1000 MG CAPS Take 1 capsule by mouth daily.        . Multiple Vitamin (MULTIVITAMIN PO) Take 1 tablet by mouth daily.        . naproxen (NAPROSYN) 500 MG tablet Take 1 tablet (500 mg total) by mouth 2 (two) times daily with a meal.  180 tablet  3  . Omega-3 Fatty Acids (FISH OIL) 1200 MG CAPS Take 1,200 mg by mouth daily.      Marland Kitchen omeprazole (PRILOSEC) 20 MG capsule Take 1 capsule (20 mg total) by mouth daily.  90 capsule  3  . oxyCODONE (OXYCONTIN) 10 MG 12 hr tablet Take 1 tablet (10 mg total) by mouth every 12 (twelve) hours.  60 tablet  0  . oxyCODONE-acetaminophen (PERCOCET) 7.5-325 MG per tablet Take 1 tablet by mouth every 8 (eight) hours as needed for pain.  90 tablet  0  . zolpidem (AMBIEN CR) 12.5 MG CR tablet Take 1 tablet (12.5 mg total) by mouth at bedtime as needed.  90  tablet  1   No facility-administered medications prior to visit.   BP 132/86  Pulse 69  Temp(Src) 98.3 F (36.8 C) (Oral)  Wt 216 lb (97.977 kg)  BMI 33.31 kg/m2  SpO2 94%  Review of Systems  Constitutional: Negative for fever, chills, activity change, appetite change, fatigue and unexpected weight change.  Eyes: Negative for visual disturbance.  Respiratory: Negative for cough and shortness of breath.   Cardiovascular: Negative for chest pain, palpitations and leg swelling.  Gastrointestinal: Negative for abdominal pain and abdominal distention.  Genitourinary: Negative for dysuria, urgency and difficulty urinating.  Musculoskeletal: Positive for myalgias and arthralgias. Negative for gait problem.  Skin: Negative for color change and rash.  Hematological: Negative for adenopathy.  Psychiatric/Behavioral: Positive for sleep disturbance. Negative for dysphoric mood. The patient is not nervous/anxious.        Objective:   Physical Exam  Constitutional:  He is oriented to person, place, and time. He appears well-developed and well-nourished. No distress.  HENT:  Head: Normocephalic and atraumatic.  Right Ear: External ear normal.  Left Ear: External ear normal.  Nose: Nose normal.  Mouth/Throat: Oropharynx is clear and moist. No oropharyngeal exudate.  Eyes: Conjunctivae and EOM are normal. Pupils are equal, round, and reactive to light. Right eye exhibits no discharge. Left eye exhibits no discharge. No scleral icterus.  Neck: Normal range of motion. Neck supple. No tracheal deviation present. No thyromegaly present.  Cardiovascular: Normal rate, regular rhythm and normal heart sounds.  Exam reveals no gallop and no friction rub.   No murmur heard. Pulmonary/Chest: Effort normal and breath sounds normal. No respiratory distress. He has no wheezes. He has no rales. He exhibits no tenderness.  Musculoskeletal: He exhibits no edema.       Cervical back: He exhibits decreased range of motion, tenderness, bony tenderness and pain. He exhibits no edema.  Lymphadenopathy:    He has no cervical adenopathy.  Neurological: He is alert and oriented to person, place, and time. No cranial nerve deficit. Coordination normal.  Skin: Skin is warm and dry. No rash noted. He is not diaphoretic. No erythema. No pallor.  Psychiatric: He has a normal mood and affect. His behavior is normal. Judgment and thought content normal.          Assessment & Plan:

## 2013-01-22 ENCOUNTER — Ambulatory Visit: Payer: Medicare Other | Admitting: Internal Medicine

## 2013-03-14 ENCOUNTER — Other Ambulatory Visit: Payer: Self-pay | Admitting: *Deleted

## 2013-03-14 MED ORDER — NAPROXEN 500 MG PO TABS: 500.0000 mg | ORAL_TABLET | Freq: Two times a day (BID) | ORAL | Status: DC

## 2013-03-14 NOTE — Telephone Encounter (Signed)
Eprescribed.

## 2013-04-21 ENCOUNTER — Encounter: Payer: Self-pay | Admitting: *Deleted

## 2013-04-22 ENCOUNTER — Encounter: Payer: Self-pay | Admitting: Internal Medicine

## 2013-04-22 ENCOUNTER — Ambulatory Visit (INDEPENDENT_AMBULATORY_CARE_PROVIDER_SITE_OTHER): Payer: Medicare Other | Admitting: Internal Medicine

## 2013-04-22 VITALS — BP 128/90 | HR 72 | Temp 98.0°F | Ht 67.5 in | Wt 224.0 lb

## 2013-04-22 DIAGNOSIS — G8929 Other chronic pain: Secondary | ICD-10-CM

## 2013-04-22 DIAGNOSIS — Z0000 Encounter for general adult medical examination without abnormal findings: Secondary | ICD-10-CM

## 2013-04-22 DIAGNOSIS — L259 Unspecified contact dermatitis, unspecified cause: Secondary | ICD-10-CM

## 2013-04-22 DIAGNOSIS — L309 Dermatitis, unspecified: Secondary | ICD-10-CM

## 2013-04-22 DIAGNOSIS — E669 Obesity, unspecified: Secondary | ICD-10-CM

## 2013-04-22 DIAGNOSIS — E039 Hypothyroidism, unspecified: Secondary | ICD-10-CM

## 2013-04-22 DIAGNOSIS — Z23 Encounter for immunization: Secondary | ICD-10-CM

## 2013-04-22 LAB — CBC WITH DIFFERENTIAL/PLATELET
Basophils Absolute: 0.1 10*3/uL (ref 0.0–0.1)
Basophils Relative: 0.7 % (ref 0.0–3.0)
Eosinophils Absolute: 0.2 10*3/uL (ref 0.0–0.7)
Eosinophils Relative: 2.3 % (ref 0.0–5.0)
HCT: 43.2 % (ref 39.0–52.0)
Hemoglobin: 15.1 g/dL (ref 13.0–17.0)
Lymphocytes Relative: 31 % (ref 12.0–46.0)
Lymphs Abs: 2.3 10*3/uL (ref 0.7–4.0)
MCHC: 34.9 g/dL (ref 30.0–36.0)
MCV: 88.2 fl (ref 78.0–100.0)
Monocytes Absolute: 0.9 10*3/uL (ref 0.1–1.0)
Monocytes Relative: 12 % (ref 3.0–12.0)
Neutro Abs: 4 10*3/uL (ref 1.4–7.7)
Neutrophils Relative %: 54 % (ref 43.0–77.0)
Platelets: 169 10*3/uL (ref 150.0–400.0)
RBC: 4.9 Mil/uL (ref 4.22–5.81)
RDW: 13.1 % (ref 11.5–14.6)
WBC: 7.5 10*3/uL (ref 4.5–10.5)

## 2013-04-22 LAB — COMPREHENSIVE METABOLIC PANEL
ALT: 35 U/L (ref 0–53)
AST: 34 U/L (ref 0–37)
Albumin: 3.9 g/dL (ref 3.5–5.2)
Alkaline Phosphatase: 38 U/L — ABNORMAL LOW (ref 39–117)
BUN: 18 mg/dL (ref 6–23)
CO2: 27 mEq/L (ref 19–32)
Calcium: 9.3 mg/dL (ref 8.4–10.5)
Chloride: 104 mEq/L (ref 96–112)
Creatinine, Ser: 0.8 mg/dL (ref 0.4–1.5)
GFR: 109.32 mL/min (ref >60.00)
Glucose, Bld: 93 mg/dL (ref 70–99)
Potassium: 3.7 mEq/L (ref 3.5–5.1)
Sodium: 137 mEq/L (ref 135–145)
Total Bilirubin: 0.8 mg/dL (ref 0.3–1.2)
Total Protein: 6.4 g/dL (ref 6.0–8.3)

## 2013-04-22 LAB — LIPID PANEL
Cholesterol: 179 mg/dL (ref 0–200)
HDL: 31.2 mg/dL — ABNORMAL LOW (ref >39.00)
LDL Cholesterol: 111 mg/dL — ABNORMAL HIGH (ref 0–99)
Total CHOL/HDL Ratio: 6
Triglycerides: 185 mg/dL — ABNORMAL HIGH (ref 0.0–149.0)
VLDL: 37 mg/dL (ref 0.0–40.0)

## 2013-04-22 LAB — PSA, MEDICARE: PSA: 0.64 ng/ml (ref 0.10–4.00)

## 2013-04-22 LAB — MICROALBUMIN / CREATININE URINE RATIO
Creatinine,U: 65.1 mg/dL
Microalb Creat Ratio: 1.7 mg/g (ref 0.0–30.0)
Microalb, Ur: 1.1 mg/dL (ref 0.0–1.9)

## 2013-04-22 LAB — TSH: TSH: 2.75 u[IU]/mL (ref 0.35–5.50)

## 2013-04-22 MED ORDER — OXYCODONE HCL 10 MG PO TB12: 10.0000 mg | ORAL_TABLET | Freq: Two times a day (BID) | ORAL | Status: DC

## 2013-04-22 MED ORDER — OXYCODONE-ACETAMINOPHEN 7.5-325 MG PO TABS: 1.0000 | ORAL_TABLET | Freq: Three times a day (TID) | ORAL | Status: DC | PRN

## 2013-04-22 MED ORDER — DESONIDE 0.05 % EX OINT: TOPICAL_OINTMENT | Freq: Two times a day (BID) | CUTANEOUS | Status: DC

## 2013-04-22 NOTE — Assessment & Plan Note (Signed)
Chronic pain in the neck and upper back secondary to degenerative joint disease. Failed invasive treatment including steroid injections. Symptoms have been well-controlled for greater than 2 years on current medications. Will refill medication x3 months today. Followup in 3 months.

## 2013-04-22 NOTE — Progress Notes (Signed)
Subjective:    Patient ID: Mark [name], male    DOB: [birth year], 57 y.o.   MRN: [id number]  HPI The patient is here for annual Medicare wellness examination and management of other chronic and acute problems.   The risk factors are reflected in the social history.  The roster of all physicians providing medical care to patient - is listed in the Snapshot section of the chart.  Activities of daily living:  The patient is 100% independent in all ADLs: dressing, toileting, feeding as well as independent mobility  Home safety : The patient has smoke detectors in the home. They wear seatbelts.  There are no firearms at home. There is no violence in the home.   There is no risks for hepatitis, STDs or HIV. There is no history of blood transfusion. They have no travel history to infectious disease endemic areas of the world.  The patient has seen their dentist in the last six month.  Dentist - Dr. [place] They have seen their eye doctor in the last year. Opthalmology - Dr. Faylene Kurtz No issues with hearing.  They have deferred audiologic testing in the last year.  Previously seen by Dr. Andee Poles in ENT for hearing issue, but this has resolved. Dr. Lady Gary - Cardiologist, s/p recent stress test and ECHO which were normal. Pacemaker working, needs new battery in 3 years per pt.  They do not have excessive sun exposure. Discussed the need for sun protection: hats, long sleeves and use of sunscreen if there is significant sun exposure.   Diet: the importance of a healthy diet is discussed. They do have a relatively healthy diet.  The benefits of regular aerobic exercise were discussed.  Limited by chronic pain issues.  Depression screen: there are no signs or vegative symptoms of depression- irritability, change in appetite, anhedonia, sadness/tearfullness. Some increase depressed mood and anxiety related to recent divorce, however feels that he is coping well.   Cognitive assessment: the patient  manages all their financial and personal affairs and is actively engaged. They could relate day,date,year and events.  The following portions of the patient's history were reviewed and updated as appropriate: allergies, current medications, past family history, past medical history,  past surgical history, past social history  and problem list.  Visual acuity was not assessed per patient preference since he has regular follow up with ophthalmologist. Hearing and body mass index were assessed and reviewed.   During the course of the visit the patient was educated and counseled about appropriate screening and preventive services including : fall prevention , diabetes screening, nutrition counseling, colorectal cancer screening, and recommended immunizations.    In regards to chronic pain and thoracic spine secondary to degenerative joint disease, patient reports good control with OxyContin and when necessary Percocet. He has been stable on current regimen for over 2 years. He has not had side effects such as constipation or oversedation.  Outpatient Encounter Prescriptions as of 04/22/2013  Medication Sig Dispense Refill  . aspirin 81 MG tablet Take 81 mg by mouth daily.        Marland Kitchen b complex vitamins tablet Take 1 tablet by mouth daily.        . cetirizine (ZYRTEC) 10 MG tablet Take 10 mg by mouth daily.        . Cholecalciferol (VITAMIN D3) 1000 UNITS CAPS Take 1 Units by mouth daily.        . clindamycin (CLEOCIN) 150 MG capsule Take 150 mg by mouth daily. 4  caps 1 hour before dental procedures.       . clonazePAM (KLONOPIN) 1 MG tablet Take 1 tablet (1 mg total) by mouth 3 (three) times daily as needed for anxiety.  90 tablet  1  . CRANBERRY PO Take 4,200 mg by mouth 2 (two) times daily.       Marland Kitchen desonide (DESOWEN) 0.05 % lotion Apply 1 application topically daily.        Marland Kitchen FLUoxetine (PROZAC) 20 MG capsule Take 1 capsule (20 mg total) by mouth daily.  90 capsule  3  . FLUoxetine (PROZAC) 40 MG  capsule Take 1 capsule (40 mg total) by mouth daily.  90 capsule  3  . fluticasone (FLONASE) 50 MCG/ACT nasal spray Place 2 sprays into the nose daily.  16 g  4  . furosemide (LASIX) 20 MG tablet Take 1 tablet (20 mg total) by mouth 2 (two) times daily.  90 tablet  3  . Garlic 1000 MG CAPS Take 1 capsule by mouth daily.        . Multiple Vitamin (MULTIVITAMIN PO) Take 1 tablet by mouth daily.        . naproxen (NAPROSYN) 500 MG tablet Take 1 tablet (500 mg total) by mouth 2 (two) times daily with a meal.  180 tablet  0  . Omega-3 Fatty Acids (FISH OIL) 1200 MG CAPS Take 1,200 mg by mouth daily.      Marland Kitchen omeprazole (PRILOSEC) 20 MG capsule Take 1 capsule (20 mg total) by mouth daily.  90 capsule  3  . oxyCODONE (OXYCONTIN) 10 MG 12 hr tablet Take 1 tablet (10 mg total) by mouth every 12 (twelve) hours.  60 tablet  0  . oxyCODONE-acetaminophen (PERCOCET) 7.5-325 MG per tablet Take 1 tablet by mouth every 8 (eight) hours as needed for pain.  90 tablet  0  . chlorzoxazone (PARAFON) 500 MG tablet Take 1 tablet (500 mg total) by mouth every 6 (six) hours as needed.  180 tablet  1  . eszopiclone (LUNESTA) 2 MG TABS Take 1 tablet (2 mg total) by mouth at bedtime. Take immediately before bedtime  30 tablet  1   No facility-administered encounter medications on file as of 04/22/2013.   BP 128/90  Pulse 72  Temp(Src) 98 F (36.7 C) (Oral)  Ht 5' 7.5" (1.715 m)  Wt 224 lb (101.606 kg)  BMI 34.55 kg/m2  SpO2 98%   Review of Systems  Constitutional: Negative for fever, chills, activity change, appetite change, fatigue and unexpected weight change.  HENT: Positive for neck pain (chronic). Negative for ear pain, congestion and sinus pressure.   Eyes: Negative for visual disturbance.  Respiratory: Negative for cough and shortness of breath.   Cardiovascular: Negative for chest pain, palpitations and leg swelling.  Gastrointestinal: Negative for abdominal pain and abdominal distention.  Genitourinary:  Negative for dysuria, urgency and difficulty urinating.  Musculoskeletal: Positive for myalgias, back pain and arthralgias. Negative for gait problem.  Skin: Negative for color change and rash.  Hematological: Negative for adenopathy.  Psychiatric/Behavioral: Positive for dysphoric mood. Negative for sleep disturbance. The patient is nervous/anxious.        Objective:   Physical Exam  Constitutional: He is oriented to person, place, and time. He appears well-developed and well-nourished. No distress.  HENT:  Head: Normocephalic and atraumatic.  Right Ear: External ear normal.  Left Ear: External ear normal.  Nose: Nose normal.  Mouth/Throat: Oropharynx is clear and moist. No oropharyngeal exudate.  Eyes: Conjunctivae  and EOM are normal. Pupils are equal, round, and reactive to light. Right eye exhibits no discharge. Left eye exhibits no discharge. No scleral icterus.  Neck: Normal range of motion. Neck supple. No tracheal deviation present. No thyromegaly present.  Cardiovascular: Normal rate, regular rhythm and normal heart sounds.  Exam reveals no gallop and no friction rub.   No murmur heard. Pulmonary/Chest: Effort normal and breath sounds normal. No respiratory distress. He has no wheezes. He has no rales. He exhibits no tenderness.  Abdominal: Soft. Bowel sounds are normal. He exhibits no distension and no mass. There is no tenderness. There is no rebound and no guarding.  Musculoskeletal: He exhibits no edema.       Cervical back: He exhibits decreased range of motion, tenderness, pain and spasm. He exhibits no bony tenderness.  Lymphadenopathy:    He has no cervical adenopathy.  Neurological: He is alert and oriented to person, place, and time. No cranial nerve deficit. Coordination normal.  Skin: Skin is warm and dry. No rash noted. He is not diaphoretic. No erythema. No pallor.  Psychiatric: He has a normal mood and affect. His behavior is normal. Judgment and thought content  normal.          Assessment & Plan:

## 2013-04-22 NOTE — Assessment & Plan Note (Signed)
Wt Readings from Last 3 Encounters:  04/22/13 224 lb (101.606 kg)  01/20/13 216 lb (97.977 kg)  10/21/12 210 lb (95.255 kg)   Body mass index is 34.55 kg/(m^2).  Encouraged him to keep a food diary and limit caloric intake to less than 1200 calories per day with goal of weight loss. Encouraged regular physical activity such as water walking.

## 2013-04-22 NOTE — Assessment & Plan Note (Signed)
General medical exam normal today. Reviewed recent cardiology evaluation including echo which was normal. Patient has scheduled followup with his urologist. Will check PSA with labs today. We'll also check CBC, CMP, lipid profile. Encouraged continued efforts at healthy diet and increase physical activity with activity such as water walking given his history of chronic back pain. Discussed the importance of weight loss. Flu vaccine given today.

## 2013-04-24 ENCOUNTER — Encounter: Payer: Medicare Other | Admitting: Internal Medicine

## 2013-05-09 ENCOUNTER — Encounter: Payer: Self-pay | Admitting: Internal Medicine

## 2013-07-22 ENCOUNTER — Ambulatory Visit (INDEPENDENT_AMBULATORY_CARE_PROVIDER_SITE_OTHER): Payer: Medicare Other | Admitting: Internal Medicine

## 2013-07-22 ENCOUNTER — Other Ambulatory Visit: Payer: Self-pay | Admitting: Internal Medicine

## 2013-07-22 ENCOUNTER — Encounter: Payer: Self-pay | Admitting: Internal Medicine

## 2013-07-22 VITALS — BP 160/82 | HR 70 | Temp 98.3°F | Wt 214.0 lb

## 2013-07-22 DIAGNOSIS — G8929 Other chronic pain: Secondary | ICD-10-CM

## 2013-07-22 DIAGNOSIS — Z202 Contact with and (suspected) exposure to infections with a predominantly sexual mode of transmission: Secondary | ICD-10-CM

## 2013-07-22 DIAGNOSIS — Z9189 Other specified personal risk factors, not elsewhere classified: Secondary | ICD-10-CM

## 2013-07-22 LAB — COMPREHENSIVE METABOLIC PANEL
ALT: 30 U/L (ref 0–53)
AST: 33 U/L (ref 0–37)
Albumin: 4.3 g/dL (ref 3.5–5.2)
Alkaline Phosphatase: 47 U/L (ref 39–117)
BUN: 17 mg/dL (ref 6–23)
CO2: 27 mEq/L (ref 19–32)
Calcium: 9.3 mg/dL (ref 8.4–10.5)
Chloride: 103 mEq/L (ref 96–112)
Creatinine, Ser: 0.8 mg/dL (ref 0.4–1.5)
GFR: 100.27 mL/min (ref >60.00)
Glucose, Bld: 119 mg/dL — ABNORMAL HIGH (ref 70–99)
Potassium: 3.6 mEq/L (ref 3.5–5.1)
Sodium: 138 mEq/L (ref 135–145)
Total Bilirubin: 0.7 mg/dL (ref 0.3–1.2)
Total Protein: 7 g/dL (ref 6.0–8.3)

## 2013-07-22 MED ORDER — OXYCODONE HCL ER 10 MG PO T12A: 10.0000 mg | EXTENDED_RELEASE_TABLET | Freq: Two times a day (BID) | ORAL | Status: DC

## 2013-07-22 MED ORDER — OXYCODONE-ACETAMINOPHEN 7.5-325 MG PO TABS: 1.0000 | ORAL_TABLET | Freq: Three times a day (TID) | ORAL | Status: DC | PRN

## 2013-07-22 MED ORDER — OMEPRAZOLE 20 MG PO CPDR: 20.0000 mg | DELAYED_RELEASE_CAPSULE | Freq: Every day | ORAL | Status: DC

## 2013-07-22 NOTE — Progress Notes (Signed)
Subjective:    Patient ID: Mark [name], male    DOB: [birth year], 56 y.o.   MRN: [id number]  HPI 57 year old male with history of anxiety/depression, chronic back and neck pain presents for followup. His primary concern today is recent unprotected intercourse with a woman who may have hepatitis C. He reports that he had unprotected intercourse with her during her menstrual cycle. He is concerned that he may have contracted an STD, specifically hepatitis C as she was a possible carrier. He would like to be tested for this. He denies any personal history of hepatitis. He denies any discharge from his urethra, dysuria, or other symptoms.  In regards to chronic back and neck pain, he reports symptoms have been well-controlled on current medications. He denies any side effects from the medication. He typically uses only Oxycontin with good control of pain symptoms and pain <5/10 intensity. When he participates in more physical activity, he often has exacerbation of pain with aching pain in upper back that radiates to his arms. In this setting he uses Percocet with improvement.   Outpatient Encounter Prescriptions as of 07/22/2013  Medication Sig  . aspirin 81 MG tablet Take 81 mg by mouth daily.    Marland Kitchen b complex vitamins tablet Take 1 tablet by mouth daily.    . cetirizine (ZYRTEC) 10 MG tablet Take 10 mg by mouth daily.    . Cholecalciferol (VITAMIN D3) 1000 UNITS CAPS Take 1 Units by mouth daily.    . clindamycin (CLEOCIN) 150 MG capsule Take 150 mg by mouth daily. 4 caps 1 hour before dental procedures.   . clonazePAM (KLONOPIN) 1 MG tablet Take 1 tablet (1 mg total) by mouth 3 (three) times daily as needed for anxiety.  Marland Kitchen CRANBERRY PO Take 4,200 mg by mouth 2 (two) times daily.   Marland Kitchen desonide (DESOWEN) 0.05 % ointment Apply topically 2 (two) times daily.  Marland Kitchen FLUoxetine (PROZAC) 20 MG capsule Take 1 capsule (20 mg total) by mouth daily.  Marland Kitchen FLUoxetine (PROZAC) 40 MG capsule Take 1 capsule (40 mg  total) by mouth daily.  . fluticasone (FLONASE) 50 MCG/ACT nasal spray Place 2 sprays into the nose daily.  . furosemide (LASIX) 20 MG tablet Take 1 tablet (20 mg total) by mouth 2 (two) times daily.  . Garlic 1000 MG CAPS Take 1 capsule by mouth daily.    . Multiple Vitamin (MULTIVITAMIN PO) Take 1 tablet by mouth daily.    . naproxen (NAPROSYN) 500 MG tablet Take 1 tablet (500 mg total) by mouth 2 (two) times daily with a meal.  . Omega-3 Fatty Acids (FISH OIL) 1200 MG CAPS Take 1,200 mg by mouth daily.  Marland Kitchen omeprazole (PRILOSEC) 20 MG capsule Take 1 capsule (20 mg total) by mouth daily.  Marland Kitchen oxyCODONE-acetaminophen (PERCOCET) 7.5-325 MG per tablet Take 1 tablet by mouth every 8 (eight) hours as needed for pain.  . Saw [place]metto 450 MG CAPS Take by mouth.  . OxyCODONE (OXYCONTIN) 10 mg T12A 12 hr tablet Take 1 tablet (10 mg total) by mouth every 12 (twelve) hours.   BP 160/82  Pulse 70  Temp(Src) 98.3 F (36.8 C) (Oral)  Wt 214 lb (97.07 kg)  SpO2 97%  Review of Systems  Constitutional: Negative for fever, chills, activity change, appetite change, fatigue and unexpected weight change.  Eyes: Negative for visual disturbance.  Respiratory: Negative for cough and shortness of breath.   Cardiovascular: Negative for chest pain, palpitations and leg swelling.  Gastrointestinal: Negative for  abdominal pain and abdominal distention.  Genitourinary: Negative for dysuria, urgency and difficulty urinating.  Musculoskeletal: Positive for arthralgias, back pain, myalgias and neck pain. Negative for gait problem.  Skin: Negative for color change and rash.  Hematological: Negative for adenopathy.  Psychiatric/Behavioral: Negative for sleep disturbance and dysphoric mood. The patient is not nervous/anxious.        Objective:   Physical Exam  Constitutional: He is oriented to person, place, and time. He appears well-developed and well-nourished. No distress.  HENT:  Head: Normocephalic and  atraumatic.  Right Ear: External ear normal.  Left Ear: External ear normal.  Nose: Nose normal.  Mouth/Throat: Oropharynx is clear and moist. No oropharyngeal exudate.  Eyes: Conjunctivae and EOM are normal. Pupils are equal, round, and reactive to light. Right eye exhibits no discharge. Left eye exhibits no discharge. No scleral icterus.  Neck: Normal range of motion. Neck supple. No tracheal deviation present. No thyromegaly present.  Cardiovascular: Normal rate, regular rhythm and normal heart sounds.  Exam reveals no gallop and no friction rub.   No murmur heard. Pulmonary/Chest: Effort normal and breath sounds normal. No respiratory distress. He has no wheezes. He has no rales. He exhibits no tenderness.  Musculoskeletal: He exhibits no edema.       Cervical back: He exhibits decreased range of motion, tenderness and pain. He exhibits no bony tenderness.  Lymphadenopathy:    He has no cervical adenopathy.  Neurological: He is alert and oriented to person, place, and time. No cranial nerve deficit. Coordination normal.  Skin: Skin is warm and dry. No rash noted. He is not diaphoretic. No erythema. No pallor.  Psychiatric: He has a normal mood and affect. His behavior is normal. Judgment and thought content normal.          Assessment & Plan:

## 2013-07-22 NOTE — Assessment & Plan Note (Signed)
Patient reports possible STD exposure. Will check urine for gonorrhea and chlamydia. Will send blood for HIV, hepatitis C, hepatitis B, and syphilis. Encouraged use of barrier protection during intercourse.

## 2013-07-22 NOTE — Progress Notes (Signed)
Pre-visit discussion using our clinic review tool. No additional management support is needed unless otherwise documented below in the visit note.  

## 2013-07-22 NOTE — Assessment & Plan Note (Addendum)
Chronic pain in the neck and upper back secondary to degenerative joint disease. Failed invasive treatment including steroid injections. Symptoms have been well-controlled for greater than 2 years on current medications. Will refill medication x3 months today. Goal pain control pain score <5/10. Followup in 3 months.

## 2013-07-23 LAB — HEPATITIS B SURFACE ANTIGEN: Hepatitis B Surface Ag: NEGATIVE

## 2013-07-23 LAB — HEPATITIS C ANTIBODY, REFLEX: HCV Ab: NEGATIVE

## 2013-07-23 LAB — GC/CHLAMYDIA PROBE AMP, URINE
Chlamydia, Swab/Urine, PCR: NEGATIVE
GC Probe Amp, Urine: NEGATIVE

## 2013-07-23 LAB — RPR

## 2013-07-23 LAB — HEPATITIS B SURFACE ANTIBODY,QUALITATIVE: Hep B S Ab: NEGATIVE

## 2013-07-25 LAB — HIV-1 RNA, QUALITATIVE, TMA: HIV-1 RNA, Qualitative, TMA: NOT DETECTED

## 2013-07-28 ENCOUNTER — Ambulatory Visit: Payer: Self-pay | Admitting: Urology

## 2013-08-11 ENCOUNTER — Other Ambulatory Visit: Payer: Self-pay | Admitting: *Deleted

## 2013-08-11 MED ORDER — FLUOXETINE HCL 40 MG PO CAPS: 40.0000 mg | ORAL_CAPSULE | Freq: Every day | ORAL | Status: DC

## 2013-08-11 MED ORDER — FLUOXETINE HCL 20 MG PO CAPS: 20.0000 mg | ORAL_CAPSULE | Freq: Every day | ORAL | Status: DC

## 2013-08-11 MED ORDER — FUROSEMIDE 20 MG PO TABS: 20.0000 mg | ORAL_TABLET | Freq: Two times a day (BID) | ORAL | Status: DC

## 2013-08-14 ENCOUNTER — Other Ambulatory Visit: Payer: Self-pay | Admitting: *Deleted

## 2013-08-14 DIAGNOSIS — F419 Anxiety disorder, unspecified: Secondary | ICD-10-CM

## 2013-08-14 MED ORDER — CLONAZEPAM 1 MG PO TABS: 1.0000 mg | ORAL_TABLET | Freq: Three times a day (TID) | ORAL | Status: DC | PRN

## 2013-08-14 MED ORDER — NAPROXEN 500 MG PO TABS: 500.0000 mg | ORAL_TABLET | Freq: Two times a day (BID) | ORAL | Status: DC

## 2013-08-14 NOTE — Telephone Encounter (Signed)
naproxen (NAPROSYN) 500 MG tablet   clonazePAM (KLONOPIN) 1 MG tablet

## 2013-09-25 ENCOUNTER — Telehealth: Payer: Self-pay | Admitting: Emergency Medicine

## 2013-09-25 NOTE — Telephone Encounter (Signed)
Pt is wondering if he can be seen sooner than 10/24/13. He will run out of his medications on 10/21/13, which means 3 days without any meds. Please advise.

## 2013-09-26 NOTE — Telephone Encounter (Signed)
Patient appointment has been rescheduled for 10/20/2013 at 1015

## 2013-10-17 ENCOUNTER — Encounter: Payer: Self-pay | Admitting: *Deleted

## 2013-10-20 ENCOUNTER — Ambulatory Visit (INDEPENDENT_AMBULATORY_CARE_PROVIDER_SITE_OTHER): Payer: Medicare Other | Admitting: Internal Medicine

## 2013-10-20 ENCOUNTER — Encounter: Payer: Self-pay | Admitting: Internal Medicine

## 2013-10-20 VITALS — BP 120/72 | HR 82 | Temp 97.9°F | Wt 217.0 lb

## 2013-10-20 DIAGNOSIS — G8929 Other chronic pain: Secondary | ICD-10-CM

## 2013-10-20 DIAGNOSIS — F419 Anxiety disorder, unspecified: Secondary | ICD-10-CM

## 2013-10-20 DIAGNOSIS — F411 Generalized anxiety disorder: Secondary | ICD-10-CM

## 2013-10-20 DIAGNOSIS — M25532 Pain in left wrist: Secondary | ICD-10-CM | POA: Insufficient documentation

## 2013-10-20 DIAGNOSIS — M25539 Pain in unspecified wrist: Secondary | ICD-10-CM

## 2013-10-20 MED ORDER — NAPROXEN 500 MG PO TABS: 500.0000 mg | ORAL_TABLET | Freq: Two times a day (BID) | ORAL | Status: DC

## 2013-10-20 MED ORDER — CLONAZEPAM 1 MG PO TABS: 1.0000 mg | ORAL_TABLET | Freq: Three times a day (TID) | ORAL | Status: DC | PRN

## 2013-10-20 MED ORDER — OXYCODONE-ACETAMINOPHEN 7.5-325 MG PO TABS: 1.0000 | ORAL_TABLET | Freq: Three times a day (TID) | ORAL | Status: DC | PRN

## 2013-10-20 MED ORDER — FLUTICASONE PROPIONATE 50 MCG/ACT NA SUSP: 2.0000 | Freq: Every day | NASAL | Status: DC

## 2013-10-20 MED ORDER — OXYCODONE HCL ER 10 MG PO T12A: 10.0000 mg | EXTENDED_RELEASE_TABLET | Freq: Two times a day (BID) | ORAL | Status: DC

## 2013-10-20 NOTE — Assessment & Plan Note (Signed)
Chronic pain in the neck and upper back secondary to degenerative joint disease. Failed invasive treatment including steroid injections. Symptoms have been well-controlled for greater than 2 years on current medications. Will refill medication including Oxycontin and Oxycodone x1 month today. Goal pain control pain score <5/10. Will set up referral to [facility] for chronic pain management. Controlled substances contract on file.

## 2013-10-20 NOTE — Progress Notes (Signed)
Subjective:    Patient ID: Mark [name], male    DOB: 11/12/1955, 58 y.o.   MRN: [id number]  HPI 58YO male with chronic neck pain presents for follow up.  Chronic neck pain - Symptoms well controlled, described as dull ache at baseline, however are worsened with physical activity such as walking, prolonged sitting, carrying groceries, etc. Compliant with medication. No side effects noted.  Left wrist pain for several weeks. No known injury. Tried using icy hot with no improvement. Sometimes feels that left thumb is "snapping" with movement. Has h/o carpal tunnel repair on this side.  Review of Systems  Constitutional: Negative for fever, chills, activity change, appetite change, fatigue and unexpected weight change.  Eyes: Negative for visual disturbance.  Respiratory: Negative for cough and shortness of breath.   Cardiovascular: Negative for chest pain, palpitations and leg swelling.  Gastrointestinal: Negative for abdominal pain and abdominal distention.  Genitourinary: Negative for dysuria, urgency and difficulty urinating.  Musculoskeletal: Positive for arthralgias, back pain, myalgias, neck pain and neck stiffness. Negative for gait problem.  Skin: Negative for color change and rash.  Hematological: Negative for adenopathy.  Psychiatric/Behavioral: Negative for sleep disturbance and dysphoric mood. The patient is not nervous/anxious.        Objective:    BP 120/72  Pulse 82  Temp(Src) 97.9 F (36.6 C) (Oral)  Wt 217 lb (98.431 kg)  SpO2 97% Physical Exam  Constitutional: He is oriented to person, place, and time. He appears well-developed and well-nourished. No distress.  HENT:  Head: Normocephalic and atraumatic.  Right Ear: External ear normal.  Left Ear: External ear normal.  Nose: Nose normal.  Mouth/Throat: Oropharynx is clear and moist. No oropharyngeal exudate.  Eyes: Conjunctivae and EOM are normal. Pupils are equal, round, and reactive to light. Right eye  exhibits no discharge. Left eye exhibits no discharge. No scleral icterus.  Neck: Normal range of motion. Neck supple. No tracheal deviation present. No thyromegaly present.  Cardiovascular: Normal rate, regular rhythm and normal heart sounds.  Exam reveals no gallop and no friction rub.   No murmur heard. Pulmonary/Chest: Effort normal and breath sounds normal. No accessory muscle usage. Not tachypneic. No respiratory distress. He has no decreased breath sounds. He has no wheezes. He has no rhonchi. He has no rales. He exhibits no tenderness.  Musculoskeletal: He exhibits no edema.       Cervical back: He exhibits decreased range of motion, tenderness, pain and spasm. He exhibits no bony tenderness.       Left hand: He exhibits tenderness. He exhibits normal range of motion and no bony tenderness. Normal sensation noted. Decreased strength noted.       Hands: Lymphadenopathy:    He has no cervical adenopathy.  Neurological: He is alert and oriented to person, place, and time. No cranial nerve deficit. Coordination normal.  Skin: Skin is warm and dry. No rash noted. He is not diaphoretic. No erythema. No pallor.  Psychiatric: He has a normal mood and affect. His behavior is normal. Judgment and thought content normal.          Assessment & Plan:   Problem List Items Addressed This Visit   Anxiety     Symptoms well controlled on Clonazepam. Medication refilled today.    Relevant Medications      clonazePAM (KLONOPIN)  tablet   Chronic pain - Primary     Chronic pain in the neck and upper back secondary to degenerative joint disease. Failed invasive  treatment including steroid injections. Symptoms have been well-controlled for greater than 2 years on current medications. Will refill medication including Oxycontin and Oxycodone x1 month today. Goal pain control pain score <5/10. Will set up referral to [facility] for chronic pain management. Controlled substances contract on  file.        Relevant Medications      oxyCODONE-acetaminophen (PERCOCET) 7.5-325 MG per tablet      oxyCODONE (OXYCONTIN) 12 hr tablet      naproxen (NAPROSYN) tablet   Other Relevant Orders      Ambulatory referral to Pain Clinic   Left wrist pain     Tenderness noted over anatomical snuff box. Question scaphoid injury. Will set up orthopedics evaluation for imaging and assessment. Note that he has a pacemaker in place and cannot have MRI.    Relevant Orders      Ambulatory referral to Orthopedic Surgery       Return in about 3 months (around 01/20/2014).

## 2013-10-20 NOTE — Assessment & Plan Note (Signed)
Symptoms well controlled on Clonazepam. Medication refilled today.

## 2013-10-20 NOTE — Assessment & Plan Note (Signed)
Tenderness noted over anatomical snuff box. Question scaphoid injury. Will set up orthopedics evaluation for imaging and assessment. Note that he has a pacemaker in place and cannot have MRI.

## 2013-10-20 NOTE — Progress Notes (Signed)
Pre visit review using our clinic review tool, if applicable. No additional management support is needed unless otherwise documented below in the visit note. 

## 2013-10-24 ENCOUNTER — Ambulatory Visit: Payer: Medicare Other | Admitting: Internal Medicine

## 2013-11-06 ENCOUNTER — Telehealth: Payer: Self-pay | Admitting: Internal Medicine

## 2013-11-06 NOTE — Telephone Encounter (Signed)
Fwd to Dr. Walker 

## 2013-11-06 NOTE — Telephone Encounter (Signed)
Amber, Can you try to get him in at [facility]? [facility]. Thanks

## 2013-11-06 NOTE — Telephone Encounter (Signed)
OK. We can try to set him up with [facility] as an alternative?

## 2013-11-06 NOTE — Telephone Encounter (Signed)
Patient would like to give that a try

## 2013-11-06 NOTE — Telephone Encounter (Signed)
States he went to pain clinic he was referred to in [place].  Pt states he had a bad experience there, waited 8 hours to see the physician for his 10:30 appt.  States he also could not understand the doctor.  States he does not want to go back there.  He states he wants Dr. Dan HumphreysWalker to know that she should not refer any other patients there.  Pt wants to be seen by Dr. Dan HumphreysWalker to discuss this and his medications.  States his med will run on 4/16 and he needs to be seen before then or he can be called to discuss by phone.  States he is willing to wean off of the medication and go on something that is not a narcotic; he just does not want to go back to [facility].  No appt available.  Please advise.  If no answer on home #, call cell.

## 2013-11-11 NOTE — Telephone Encounter (Signed)
Referral underway to [facility]

## 2013-11-12 NOTE — Telephone Encounter (Signed)
Patient called back to check on the status of this referral he did want me to let you know that he is only taken 10 mg OxyContin in the morning and only taken 1 oxycodone at night when he goes to bed.  He is doing this just in case it takes awhile to get in with the pain clinic since he is sure Dr. Dan HumphreysWalker won't refill these medications. I let the patient know that we were waiting to hear back from [facility] and that it could take several days.

## 2013-11-25 NOTE — Telephone Encounter (Signed)
Spoke with [facility] they have scheduled the pt for 12/09/13 at 220pm. The patient is aware [facility].

## 2013-12-09 ENCOUNTER — Ambulatory Visit: Payer: Self-pay | Admitting: Pain Medicine

## 2013-12-09 ENCOUNTER — Other Ambulatory Visit: Payer: Self-pay | Admitting: Pain Medicine

## 2013-12-09 LAB — BASIC METABOLIC PANEL WITH GFR
Anion Gap: 6 — ABNORMAL LOW
BUN: 20 mg/dL — ABNORMAL HIGH
Calcium, Total: 9 mg/dL
Chloride: 105 mmol/L
Co2: 28 mmol/L
Creatinine: 1.01 mg/dL
EGFR (African American): >60
EGFR (Non-African Amer.): >60
Glucose: 110 mg/dL — ABNORMAL HIGH
Osmolality: 281
Potassium: 3.6 mmol/L
Sodium: 139 mmol/L

## 2013-12-09 LAB — SEDIMENTATION RATE: Erythrocyte Sed Rate: 4 mm/h

## 2013-12-09 LAB — HEPATIC FUNCTION PANEL A (ARMC)
Albumin: 4.2 g/dL (ref 3.4–5.0)
Alkaline Phosphatase: 60 U/L
Bilirubin, Direct: 0.1 mg/dL (ref 0.00–0.20)
Bilirubin,Total: 0.6 mg/dL (ref 0.2–1.0)
SGOT(AST): 42 U/L — ABNORMAL HIGH (ref 15–37)
SGPT (ALT): 42 U/L (ref 12–78)
Total Protein: 7.2 g/dL (ref 6.4–8.2)

## 2013-12-09 LAB — MAGNESIUM: Magnesium: 2 mg/dL

## 2013-12-15 ENCOUNTER — Telehealth: Payer: Self-pay | Admitting: *Deleted

## 2013-12-15 NOTE — Telephone Encounter (Signed)
Pt requesting refill on Rx Oxycontin 10mg ,  Pt states that he has been to the pain clinic one time for the initial visit and he is scheduled for a CT this thurs, a psychology appt on 12/30/13 and a follow up appt at the pain clinic on June 8th to talk about the results of his appts and discuss what type of pain management they will start.

## 2013-12-15 NOTE — Telephone Encounter (Signed)
OK. We can refill x 1 month while he sorts out with the pain clinic.

## 2013-12-16 ENCOUNTER — Other Ambulatory Visit: Payer: Self-pay | Admitting: *Deleted

## 2013-12-16 DIAGNOSIS — G8929 Other chronic pain: Secondary | ICD-10-CM

## 2013-12-16 MED ORDER — OXYCODONE HCL ER 10 MG PO T12A: 10.0000 mg | EXTENDED_RELEASE_TABLET | Freq: Two times a day (BID) | ORAL | Status: DC

## 2013-12-16 NOTE — Telephone Encounter (Signed)
Left message, notifying Rx ready for pick up, placed up front

## 2013-12-18 ENCOUNTER — Ambulatory Visit: Payer: Self-pay | Admitting: Pain Medicine

## 2014-01-12 ENCOUNTER — Ambulatory Visit: Payer: Self-pay | Admitting: Pain Medicine

## 2014-01-15 ENCOUNTER — Ambulatory Visit: Payer: Self-pay | Admitting: Pain Medicine

## 2014-01-23 ENCOUNTER — Encounter: Payer: Self-pay | Admitting: Internal Medicine

## 2014-01-23 ENCOUNTER — Ambulatory Visit (INDEPENDENT_AMBULATORY_CARE_PROVIDER_SITE_OTHER): Payer: Medicare Other | Admitting: Internal Medicine

## 2014-01-23 VITALS — BP 104/70 | HR 81 | Resp 14 | Ht 67.5 in | Wt 219.5 lb

## 2014-01-23 DIAGNOSIS — F419 Anxiety disorder, unspecified: Secondary | ICD-10-CM

## 2014-01-23 DIAGNOSIS — G8929 Other chronic pain: Secondary | ICD-10-CM

## 2014-01-23 DIAGNOSIS — F411 Generalized anxiety disorder: Secondary | ICD-10-CM

## 2014-01-23 DIAGNOSIS — E669 Obesity, unspecified: Secondary | ICD-10-CM

## 2014-01-23 NOTE — Assessment & Plan Note (Signed)
Wt Readings from Last 3 Encounters:  01/23/14 219 lb 8 oz (99.565 kg)  10/20/13 217 lb (98.431 kg)  07/22/13 214 lb (97.07 kg)   Body mass index is 33.85 kg/(m^2). Encouraged healthy, Mediterranean style diet and regular exercise, such as walking or biking.

## 2014-01-23 NOTE — Assessment & Plan Note (Addendum)
Chronic pain neck and upper back well controlled with recent use of ESI and Oxycontin and Oxycodone through [facility]. Will continue to follow with Dr. Marchia BondNavarra.

## 2014-01-23 NOTE — Progress Notes (Signed)
Pre visit review using our clinic review tool, if applicable. No additional management support is needed unless otherwise documented below in the visit note. 

## 2014-01-23 NOTE — Progress Notes (Signed)
Subjective:    Patient ID: Mark HansonDennis K Robart, male    DOB: 07/28/1956, 58 y.o.   MRN: [id number]  HPI 58YO male presents for follow up.  Chronic pain - Now followed by Dr. Marchia BondNavarra. Had ESI cervical spine 01/15/2014 with some improvement in pain symptoms, however feels like effects are "wearing off." Continues to take Oxycontin 10mg  bid and prn percocet. Typically, uses percocet 2-3 times per day. No side effects noted. Had a psychological evaluation for chronic narcotics with Dr. Letta MoynahanLarter.  Also has OA bilateral knees. Plans to repeat Synvisc injections in bilateral knees with Dr. Marchia BondNavarra in the next few weeks. Hoping to be more active walking for exercise after these injections.  Generally, feeling well. No new concerns today.  Anxiety has been well controlled with prn Clonazepam. No side effects noted with this.   Review of Systems  Constitutional: Negative for fever, chills, activity change, appetite change, fatigue and unexpected weight change.  Eyes: Negative for visual disturbance.  Respiratory: Negative for cough and shortness of breath.   Cardiovascular: Negative for chest pain, palpitations and leg swelling.  Gastrointestinal: Negative for abdominal pain and abdominal distention.  Genitourinary: Negative for dysuria, urgency and difficulty urinating.  Musculoskeletal: Positive for arthralgias, back pain, myalgias, neck pain and neck stiffness. Negative for gait problem.  Skin: Negative for color change and rash.  Hematological: Negative for adenopathy.  Psychiatric/Behavioral: Negative for sleep disturbance and dysphoric mood. The patient is not nervous/anxious.        Objective:    BP 104/70  Pulse 81  Resp 14  Ht 5' 7.5" (1.715 m)  Wt 219 lb 8 oz (99.565 kg)  BMI 33.85 kg/m2  SpO2 97% Physical Exam  Constitutional: He is oriented to person, place, and time. He appears well-developed and well-nourished. No distress.  HENT:  Head: Normocephalic and atraumatic.  Right  Ear: External ear normal.  Left Ear: External ear normal.  Nose: Nose normal.  Mouth/Throat: Oropharynx is clear and moist. No oropharyngeal exudate.  Eyes: Conjunctivae and EOM are normal. Pupils are equal, round, and reactive to light. Right eye exhibits no discharge. Left eye exhibits no discharge. No scleral icterus.  Neck: Normal range of motion. Neck supple. No tracheal deviation present. No thyromegaly present.  Cardiovascular: Normal rate, regular rhythm and normal heart sounds.  Exam reveals no gallop and no friction rub.   No murmur heard. Pulmonary/Chest: Effort normal and breath sounds normal. No accessory muscle usage. Not tachypneic. No respiratory distress. He has no decreased breath sounds. He has no wheezes. He has no rhonchi. He has no rales. He exhibits no tenderness.  Musculoskeletal: He exhibits no edema.       Right knee: He exhibits decreased range of motion. He exhibits no swelling.       Left knee: He exhibits decreased range of motion. He exhibits no swelling.       Cervical back: He exhibits decreased range of motion, tenderness and pain. He exhibits no bony tenderness.  Lymphadenopathy:    He has no cervical adenopathy.  Neurological: He is alert and oriented to person, place, and time. No cranial nerve deficit. Coordination normal.  Skin: Skin is warm and dry. No rash noted. He is not diaphoretic. No erythema. No pallor.  Psychiatric: He has a normal mood and affect. His behavior is normal. Judgment and thought content normal.          Assessment & Plan:   Problem List Items Addressed This Visit  Unprioritized   Anxiety     Symptoms well controlled with prn Clonazepam and Fluoxetine. Will continue.    Chronic pain - Primary     Chronic pain neck and upper back well controlled with recent use of ESI and Oxycontin and Oxycodone through [facility]. Will continue to follow with Dr. Marchia BondNavarra.     Obesity (BMI 30-39.9)      Wt Readings from Last  3 Encounters:  01/23/14 219 lb 8 oz (99.565 kg)  10/20/13 217 lb (98.431 kg)  07/22/13 214 lb (97.07 kg)   Body mass index is 33.85 kg/(m^2). Encouraged healthy, Mediterranean style diet and regular exercise, such as walking or biking.        Return in about 3 months (around 04/25/2014) for Wellness Visit.

## 2014-01-23 NOTE — Assessment & Plan Note (Signed)
Symptoms well controlled with prn Clonazepam and Fluoxetine. Will continue.

## 2014-02-04 ENCOUNTER — Ambulatory Visit: Payer: Self-pay | Admitting: Pain Medicine

## 2014-02-16 DIAGNOSIS — R011 Cardiac murmur, unspecified: Secondary | ICD-10-CM | POA: Insufficient documentation

## 2014-02-16 DIAGNOSIS — I10 Essential (primary) hypertension: Secondary | ICD-10-CM | POA: Insufficient documentation

## 2014-02-16 DIAGNOSIS — R001 Bradycardia, unspecified: Secondary | ICD-10-CM | POA: Insufficient documentation

## 2014-02-17 ENCOUNTER — Ambulatory Visit: Payer: Self-pay | Admitting: Pain Medicine

## 2014-03-04 ENCOUNTER — Ambulatory Visit: Payer: Self-pay | Admitting: Pain Medicine

## 2014-04-28 ENCOUNTER — Ambulatory Visit (INDEPENDENT_AMBULATORY_CARE_PROVIDER_SITE_OTHER): Payer: Medicare Other | Admitting: Internal Medicine

## 2014-04-28 ENCOUNTER — Encounter: Payer: Self-pay | Admitting: Internal Medicine

## 2014-04-28 VITALS — BP 100/72 | HR 81 | Temp 98.2°F | Ht 68.5 in | Wt 219.2 lb

## 2014-04-28 DIAGNOSIS — Z23 Encounter for immunization: Secondary | ICD-10-CM

## 2014-04-28 DIAGNOSIS — E785 Hyperlipidemia, unspecified: Secondary | ICD-10-CM

## 2014-04-28 DIAGNOSIS — Z0000 Encounter for general adult medical examination without abnormal findings: Secondary | ICD-10-CM

## 2014-04-28 DIAGNOSIS — E669 Obesity, unspecified: Secondary | ICD-10-CM

## 2014-04-28 LAB — LIPID PANEL
Cholesterol: 192 mg/dL (ref 0–200)
HDL: 35.4 mg/dL — ABNORMAL LOW (ref >39.00)
NonHDL: 156.6
Total CHOL/HDL Ratio: 5
Triglycerides: 220 mg/dL — ABNORMAL HIGH (ref 0.0–149.0)
VLDL: 44 mg/dL — ABNORMAL HIGH (ref 0.0–40.0)

## 2014-04-28 LAB — CBC WITH DIFFERENTIAL/PLATELET
Basophils Absolute: 0 10*3/uL (ref 0.0–0.1)
Basophils Relative: 0.5 % (ref 0.0–3.0)
Eosinophils Absolute: 0.2 10*3/uL (ref 0.0–0.7)
Eosinophils Relative: 2.1 % (ref 0.0–5.0)
HCT: 47 % (ref 39.0–52.0)
Hemoglobin: 16.1 g/dL (ref 13.0–17.0)
Lymphocytes Relative: 24.3 % (ref 12.0–46.0)
Lymphs Abs: 2.2 10*3/uL (ref 0.7–4.0)
MCHC: 34.2 g/dL (ref 30.0–36.0)
MCV: 90 fl (ref 78.0–100.0)
Monocytes Absolute: 1.1 10*3/uL — ABNORMAL HIGH (ref 0.1–1.0)
Monocytes Relative: 12.3 % — ABNORMAL HIGH (ref 3.0–12.0)
Neutro Abs: 5.4 10*3/uL (ref 1.4–7.7)
Neutrophils Relative %: 60.8 % (ref 43.0–77.0)
Platelets: 189 10*3/uL (ref 150.0–400.0)
RBC: 5.22 Mil/uL (ref 4.22–5.81)
RDW: 13.3 % (ref 11.5–15.5)
WBC: 8.9 10*3/uL (ref 4.0–10.5)

## 2014-04-28 LAB — COMPREHENSIVE METABOLIC PANEL
ALT: 26 U/L (ref 0–53)
AST: 31 U/L (ref 0–37)
Albumin: 4.2 g/dL (ref 3.5–5.2)
Alkaline Phosphatase: 51 U/L (ref 39–117)
BUN: 21 mg/dL (ref 6–23)
CO2: 26 mEq/L (ref 19–32)
Calcium: 9.2 mg/dL (ref 8.4–10.5)
Chloride: 103 mEq/L (ref 96–112)
Creatinine, Ser: 0.8 mg/dL (ref 0.4–1.5)
GFR: 99.99 mL/min (ref >60.00)
Glucose, Bld: 108 mg/dL — ABNORMAL HIGH (ref 70–99)
Potassium: 3.8 mEq/L (ref 3.5–5.1)
Sodium: 137 mEq/L (ref 135–145)
Total Bilirubin: 0.9 mg/dL (ref 0.2–1.2)
Total Protein: 6.9 g/dL (ref 6.0–8.3)

## 2014-04-28 LAB — MICROALBUMIN / CREATININE URINE RATIO
Creatinine,U: 217.2 mg/dL
Microalb Creat Ratio: 5.9 mg/g (ref 0.0–30.0)
Microalb, Ur: 12.9 mg/dL — ABNORMAL HIGH (ref 0.0–1.9)

## 2014-04-28 LAB — TSH: TSH: 2.7 u[IU]/mL (ref 0.35–4.50)

## 2014-04-28 LAB — LDL CHOLESTEROL, DIRECT: Direct LDL: 130.5 mg/dL

## 2014-04-28 LAB — PSA, MEDICARE: PSA: 0.67 ng/ml (ref 0.10–4.00)

## 2014-04-28 NOTE — Progress Notes (Signed)
Pre visit review using our clinic review tool, if applicable. No additional management support is needed unless otherwise documented below in the visit note. 

## 2014-04-28 NOTE — Patient Instructions (Signed)

## 2014-04-28 NOTE — Assessment & Plan Note (Signed)
Wt Readings from Last 3 Encounters:  04/28/14 219 lb 4 oz (99.451 kg)  01/23/14 219 lb 8 oz (99.565 kg)  10/20/13 217 lb (98.431 kg)   Body mass index is 32.85 kg/(m^2). Encouraged healthy diet and regular exercise with goal of weight loss.

## 2014-04-28 NOTE — Progress Notes (Signed)
Subjective:    Patient ID: Mark [name], male    DOB: April 24, 1956, 58 y.o.   MRN: [id number]  HPI The patient is here for annual Medicare wellness examination and management of other chronic and acute problems.   The risk factors are reflected in the social history.  The roster of all physicians providing medical care to patient - is listed in the Snapshot section of the chart.  Activities of daily living:  The patient is 100% independent in all ADLs: dressing, toileting, feeding as well as independent mobility  Home safety : The patient has smoke detectors in the home. They wear seatbelts.  There are no firearms at home. There is no violence in the home.   There is no risks for hepatitis, STDs or HIV. There is no history of blood transfusion. They have no travel history to infectious disease endemic areas of the world.  The patient has seen their dentist in the last six month.  Dentist - Dr. [place] They have seen their eye doctor in the last year. Followed recently for cataract. Opthalmology - Dr. Faylene Kurtz No issues with hearing.  They have deferred audiologic testing in the last year.  Previously seen by Dr. Andee Poles in ENT for hearing issue, but this has resolved. Dr. Lady Gary - Cardiologist Pain management - Dr. Damian Leavell  They do not have excessive sun exposure. Discussed the need for sun protection: hats, long sleeves and use of sunscreen if there is significant sun exposure.   Diet: the importance of a healthy diet is discussed. They do have a relatively healthy diet. Cutting back on sweets and salt. Limited fruits and veggies. Trying to limit junk foods. Using whole grain products.  The benefits of regular aerobic exercise were discussed.  Limited by chronic pain issues.  Depression screen: there are no signs or vegative symptoms of depression- irritability, change in appetite, anhedonia, sadness/tearfullness.   Cognitive assessment: the patient manages all their financial and  personal affairs and is actively engaged. They could relate day,date,year and events.  The following portions of the patient's history were reviewed and updated as appropriate: allergies, current medications, past family history, past medical history,  past surgical history, past social history  and problem list.  Visual acuity was not assessed per patient preference since he has regular follow up with ophthalmologist. Hearing and body mass index were assessed and reviewed.   During the course of the visit the patient was educated and counseled about appropriate screening and preventive services including : fall prevention , diabetes screening, nutrition counseling, colorectal cancer screening, and recommended immunizations.     Review of Systems  Constitutional: Negative for fever, chills, activity change, appetite change, fatigue and unexpected weight change.  Eyes: Negative for visual disturbance.  Respiratory: Positive for choking. Negative for cough and shortness of breath.   Cardiovascular: Negative for chest pain, palpitations and leg swelling.  Gastrointestinal: Negative for nausea, vomiting, abdominal pain, diarrhea, constipation and abdominal distention.  Genitourinary: Negative for dysuria, urgency and difficulty urinating.  Musculoskeletal: Positive for arthralgias and myalgias. Negative for gait problem.  Skin: Negative for color change and rash.  Hematological: Negative for adenopathy.  Psychiatric/Behavioral: Positive for sleep disturbance. Negative for dysphoric mood. The patient is not nervous/anxious.        Objective:    BP 100/72  Pulse 81  Temp(Src) 98.2 F (36.8 C) (Oral)  Ht 5' 8.5" (1.74 m)  Wt 219 lb 4 oz (99.451 kg)  BMI 32.85 kg/m2  SpO2 97%  Physical Exam  Constitutional: He is oriented to person, place, and time. He appears well-developed and well-nourished. No distress.  HENT:  Head: Normocephalic and atraumatic.  Right Ear: External ear normal.    Left Ear: External ear normal.  Nose: Nose normal.  Mouth/Throat: Oropharynx is clear and moist. No oropharyngeal exudate.  Eyes: Conjunctivae and EOM are normal. Pupils are equal, round, and reactive to light. Right eye exhibits no discharge. Left eye exhibits no discharge. No scleral icterus.  Neck: Normal range of motion. Neck supple. No tracheal deviation present. No thyromegaly present.  Cardiovascular: Normal rate, regular rhythm and normal heart sounds.  Exam reveals no gallop and no friction rub.   No murmur heard. Pulmonary/Chest: Effort normal and breath sounds normal. No respiratory distress. He has no wheezes. He has no rales. He exhibits no tenderness.  Abdominal: Soft. Bowel sounds are normal. He exhibits no distension and no mass. There is no tenderness. There is no rebound and no guarding.  Musculoskeletal: Normal range of motion. He exhibits no edema.  Lymphadenopathy:    He has no cervical adenopathy.  Neurological: He is alert and oriented to person, place, and time. No cranial nerve deficit. Coordination normal.  Skin: Skin is warm and dry. No rash noted. He is not diaphoretic. No erythema. No pallor.  Psychiatric: He has a normal mood and affect. His behavior is normal. Judgment and thought content normal.          Assessment & Plan:   Problem List Items Addressed This Visit     Unprioritized   Medicare annual wellness visit, subsequent - Primary     General medical exam normal today. Will check PSA with labs today. We'll also check CBC, CMP, lipid profile. Encouraged continued efforts at healthy diet and increase physical activity with activity such as water walking given his history of chronic back pain. Discussed the importance of weight loss. Flu vaccine given today.      Relevant Orders      CBC with Differential      Comprehensive metabolic panel      Lipid panel      Microalbumin / creatinine urine ratio      TSH      PSA, Medicare   Obesity (BMI  30-39.9)      Wt Readings from Last 3 Encounters:  04/28/14 219 lb 4 oz (99.451 kg)  01/23/14 219 lb 8 oz (99.565 kg)  10/20/13 217 lb (98.431 kg)   Body mass index is 32.85 kg/(m^2). Encouraged healthy diet and regular exercise with goal of weight loss.     Other Visit Diagnoses   Need for prophylactic vaccination and inoculation against influenza        Relevant Orders       Flu Vaccine QUAD 36+ mos PF IM (Fluarix Quad PF) (Completed)        Return in about 6 months (around 10/27/2014).

## 2014-04-28 NOTE — Assessment & Plan Note (Signed)
General medical exam normal today. Will check PSA with labs today. We'll also check CBC, CMP, lipid profile. Encouraged continued efforts at healthy diet and increase physical activity with activity such as water walking given his history of chronic back pain. Discussed the importance of weight loss. Flu vaccine given today.

## 2014-05-06 ENCOUNTER — Encounter: Payer: Self-pay | Admitting: Internal Medicine

## 2014-05-07 ENCOUNTER — Telehealth: Payer: Self-pay | Admitting: *Deleted

## 2014-05-07 NOTE — Telephone Encounter (Signed)
Pt can see lab results on MyChart but there are no notes or any comments to explain anything

## 2014-05-08 NOTE — Telephone Encounter (Signed)
Called and advised patient of results,  verbalized understanding. 

## 2014-05-08 NOTE — Telephone Encounter (Signed)
His labs are normal except for mild elevation of triglycerides. I would recommend a low-glycemic diet, with increased fiber. He should exercise 3x per week, even walking or stationary bike would be fine. Otherwise, labs were normal. If he wants to review in detail we can make an appointment.

## 2014-05-27 ENCOUNTER — Other Ambulatory Visit: Payer: Self-pay | Admitting: *Deleted

## 2014-05-27 DIAGNOSIS — F419 Anxiety disorder, unspecified: Secondary | ICD-10-CM

## 2014-05-27 MED ORDER — OMEPRAZOLE 20 MG PO CPDR: 20.0000 mg | DELAYED_RELEASE_CAPSULE | Freq: Every day | ORAL | Status: DC

## 2014-05-27 MED ORDER — FUROSEMIDE 20 MG PO TABS: 20.0000 mg | ORAL_TABLET | Freq: Every day | ORAL | Status: DC

## 2014-05-27 MED ORDER — CLONAZEPAM 1 MG PO TABS: 1.0000 mg | ORAL_TABLET | Freq: Three times a day (TID) | ORAL | Status: DC | PRN

## 2014-05-27 NOTE — Telephone Encounter (Signed)
Last visit 04/28/14, ok refill?

## 2014-06-05 ENCOUNTER — Ambulatory Visit: Payer: Self-pay | Admitting: Pain Medicine

## 2014-08-19 ENCOUNTER — Other Ambulatory Visit: Payer: Self-pay | Admitting: Internal Medicine

## 2014-10-27 ENCOUNTER — Other Ambulatory Visit: Payer: Self-pay | Admitting: Internal Medicine

## 2014-10-27 ENCOUNTER — Ambulatory Visit (INDEPENDENT_AMBULATORY_CARE_PROVIDER_SITE_OTHER): Payer: Medicare Other | Admitting: Internal Medicine

## 2014-10-27 ENCOUNTER — Telehealth: Payer: Self-pay | Admitting: *Deleted

## 2014-10-27 ENCOUNTER — Encounter: Payer: Self-pay | Admitting: Internal Medicine

## 2014-10-27 VITALS — BP 143/84 | HR 77 | Temp 97.9°F | Ht 68.5 in | Wt 224.1 lb

## 2014-10-27 DIAGNOSIS — D696 Thrombocytopenia, unspecified: Secondary | ICD-10-CM

## 2014-10-27 DIAGNOSIS — IMO0001 Reserved for inherently not codable concepts without codable children: Secondary | ICD-10-CM | POA: Insufficient documentation

## 2014-10-27 DIAGNOSIS — E785 Hyperlipidemia, unspecified: Secondary | ICD-10-CM

## 2014-10-27 DIAGNOSIS — G8929 Other chronic pain: Secondary | ICD-10-CM | POA: Diagnosis not present

## 2014-10-27 DIAGNOSIS — E669 Obesity, unspecified: Secondary | ICD-10-CM

## 2014-10-27 DIAGNOSIS — R030 Elevated blood-pressure reading, without diagnosis of hypertension: Secondary | ICD-10-CM

## 2014-10-27 LAB — COMPREHENSIVE METABOLIC PANEL
ALT: 26 U/L (ref 0–53)
AST: 32 U/L (ref 0–37)
Albumin: 4.1 g/dL (ref 3.5–5.2)
Alkaline Phosphatase: 49 U/L (ref 39–117)
BUN: 20 mg/dL (ref 6–23)
CO2: 26 mEq/L (ref 19–32)
Calcium: 9.4 mg/dL (ref 8.4–10.5)
Chloride: 105 mEq/L (ref 96–112)
Creatinine, Ser: 1.13 mg/dL (ref 0.40–1.50)
GFR: 70.89 mL/min (ref >60.00)
Glucose, Bld: 91 mg/dL (ref 70–99)
Potassium: 3.8 mEq/L (ref 3.5–5.1)
Sodium: 137 mEq/L (ref 135–145)
Total Bilirubin: 0.7 mg/dL (ref 0.2–1.2)
Total Protein: 6.8 g/dL (ref 6.0–8.3)

## 2014-10-27 LAB — HEMOGLOBIN A1C: Hgb A1c MFr Bld: 4.9 % (ref 4.6–6.5)

## 2014-10-27 LAB — CBC
HCT: 43.6 % (ref 39.0–52.0)
Hemoglobin: 15.2 g/dL (ref 13.0–17.0)
MCHC: 34.9 g/dL (ref 30.0–36.0)
MCV: 86.7 fl (ref 78.0–100.0)
Platelets: 181 10*3/uL (ref 150.0–400.0)
RBC: 5.03 Mil/uL (ref 4.22–5.81)
RDW: 13.1 % (ref 11.5–15.5)
WBC: 8.4 10*3/uL (ref 4.0–10.5)

## 2014-10-27 NOTE — Assessment & Plan Note (Signed)
Wt Readings from Last 3 Encounters:  10/27/14 224 lb 2 oz (101.662 kg)  04/28/14 219 lb 4 oz (99.451 kg)  01/23/14 219 lb 8 oz (99.565 kg)   Body mass index is 33.58 kg/(m^2). Encouraged Mediterranean style diet and exercise with goal of weight loss.

## 2014-10-27 NOTE — Assessment & Plan Note (Signed)
BP Readings from Last 3 Encounters:  10/27/14 143/84  04/28/14 100/72  01/23/14 104/70   BP mildly elevated. However, normal on recent check both at home and with cardiology. Will continue to monitor.

## 2014-10-27 NOTE — Telephone Encounter (Signed)
Pt would like a phone and mychart message of lab results

## 2014-10-27 NOTE — Assessment & Plan Note (Signed)
Will recheck CBC with labs today. 

## 2014-10-27 NOTE — Assessment & Plan Note (Signed)
Will check LFTs with labs today. Plan to check fasting lipids in 6 months.

## 2014-10-27 NOTE — Assessment & Plan Note (Signed)
Chronic neck pain. Followed now by Dr. Marchia BondNavarra. Tried to change to MS Contin or Zohydro with poor pain control. Now taking Oxycontin 10mg  bid and prn Oxycodone. Pain well controlled. Continue to follow up with pain management.

## 2014-10-27 NOTE — Progress Notes (Signed)
Subjective:    Patient ID: Mark [name], male    DOB: 02/16/1956, 59 y.o.   MRN: [id number]  HPI  59YO male presents for follow up.  Chronic pain - Insurance would not cover Oxycontin. Tried MS Contin and extended release Hydrocodone with no improvement. Then, finally able to get approval for Oxycontin. Now taking 10mg  twice daily and prn 5mg  oxycodone for breakthrough pain. Trying to limit Tylenol intake. Followed by Dr. Marchia BondNavarra at [facility].  Aside from this, feeling well. No new concerns today. Trying to improve diet in effort to lose weight.  HTN - BP recently well controlled, <120/70. No CP, HA, palpitations.  BP Readings from Last 3 Encounters:  10/27/14 143/84  04/28/14 100/72  01/23/14 104/70    Wt Readings from Last 3 Encounters:  10/27/14 224 lb 2 oz (101.662 kg)  04/28/14 219 lb 4 oz (99.451 kg)  01/23/14 219 lb 8 oz (99.565 kg)    Past medical, surgical, family and social history per today's encounter.  Review of Systems  Constitutional: Negative for fever, chills, activity change, appetite change, fatigue and unexpected weight change.  Eyes: Negative for visual disturbance.  Respiratory: Negative for cough and shortness of breath.   Cardiovascular: Negative for chest pain, palpitations and leg swelling.  Gastrointestinal: Negative for nausea, vomiting, abdominal pain, diarrhea, constipation and abdominal distention.  Genitourinary: Negative for dysuria, urgency and difficulty urinating.  Musculoskeletal: Positive for myalgias, back pain, arthralgias and neck pain. Negative for gait problem.  Skin: Negative for color change and rash.  Hematological: Negative for adenopathy.  Psychiatric/Behavioral: Positive for sleep disturbance (occasional trouble falling asleep). Negative for dysphoric mood. The patient is not nervous/anxious.        Objective:    BP 143/84 mmHg  Pulse 77  Temp(Src) 97.9 F (36.6 C) (Oral)  Ht 5' 8.5" (1.74 m)  Wt 224 lb 2 oz  (101.662 kg)  BMI 33.58 kg/m2  SpO2 98% Physical Exam  Constitutional: He is oriented to person, place, and time. He appears well-developed and well-nourished. No distress.  HENT:  Head: Normocephalic and atraumatic.  Right Ear: External ear normal.  Left Ear: External ear normal.  Nose: Nose normal.  Mouth/Throat: Oropharynx is clear and moist. No oropharyngeal exudate.  Eyes: Conjunctivae and EOM are normal. Pupils are equal, round, and reactive to light. Right eye exhibits no discharge. Left eye exhibits no discharge. No scleral icterus.  Neck: Normal range of motion. Neck supple. No tracheal deviation present. No thyromegaly present.  Cardiovascular: Normal rate, regular rhythm and normal heart sounds.  Exam reveals no gallop and no friction rub.   No murmur heard. Pulmonary/Chest: Effort normal and breath sounds normal. No accessory muscle usage. No tachypnea. No respiratory distress. He has no decreased breath sounds. He has no wheezes. He has no rhonchi. He has no rales. He exhibits no tenderness.  Musculoskeletal: He exhibits no edema.       Cervical back: He exhibits decreased range of motion, tenderness and pain.  Lymphadenopathy:    He has no cervical adenopathy.  Neurological: He is alert and oriented to person, place, and time. No cranial nerve deficit. Coordination normal.  Skin: Skin is warm and dry. No rash noted. He is not diaphoretic. No erythema. No pallor.  Psychiatric: He has a normal mood and affect. His behavior is normal. Judgment and thought content normal.          Assessment & Plan:   Problem List Items Addressed This Visit  Unprioritized   Chronic pain - Primary    Chronic neck pain. Followed now by Dr. Marchia Bond. Tried to change to MS Contin or Zohydro with poor pain control. Now taking Oxycontin  bid and prn Oxycodone. Pain well controlled. Continue to follow up with pain management.      Relevant Medications   oxyCODONE (OXY IR/ROXICODONE) 5  MG immediate release tablet   Elevated BP    BP Readings from Last 3 Encounters:  10/27/14 143/84  04/28/14 100/72  01/23/14 104/70   BP mildly elevated. However, normal on recent check both at home and with cardiology. Will continue to monitor.      Hyperlipidemia    Will check LFTs with labs today. Plan to check fasting lipids in 6 months.      Relevant Orders   Comprehensive metabolic panel   Obesity (BMI 29-56.9)    Wt Readings from Last 3 Encounters:  10/27/14 224 lb 2 oz (101.662 kg)  04/28/14 219 lb 4 oz (99.451 kg)  01/23/14 219 lb 8 oz (99.565 kg)   Body mass index is 33.58 kg/(m^2). Encouraged Mediterranean style diet and exercise with goal of weight loss.      Relevant Orders   Hemoglobin A1c   Thrombocytopenia    Will recheck CBC with labs today.      Relevant Orders   CBC       Return in about 6 months (around 04/29/2015) for Wellness Visit.

## 2014-10-27 NOTE — Progress Notes (Signed)
Pre visit review using our clinic review tool, if applicable. No additional management support is needed unless otherwise documented below in the visit note. 

## 2014-10-27 NOTE — Patient Instructions (Addendum)
Labs today.  Follow up in 6 months.     Why follow it? Research shows. . Those who follow the Mediterranean diet have a reduced risk of heart disease  . The diet is associated with a reduced incidence of Parkinson's and Alzheimer's diseases . People following the diet may have longer life expectancies and lower rates of chronic diseases  . The Dietary Guidelines for Americans recommends the Mediterranean diet as an eating plan to promote health and prevent disease  What Is the Mediterranean Diet?  . Healthy eating plan based on typical foods and recipes of Mediterranean-style cooking . The diet is primarily a plant based diet; these foods should make up a majority of meals   Starches - Plant based foods should make up a majority of meals - They are an important sources of vitamins, minerals, energy, antioxidants, and fiber - Choose whole grains, foods high in fiber and minimally processed items  - Typical grain sources include wheat, oats, barley, corn, brown rice, bulgar, farro, millet, polenta, couscous  - Various types of beans include chickpeas, lentils, fava beans, black beans, white beans   Fruits  Veggies - Large quantities of antioxidant rich fruits & veggies; 6 or more servings  - Vegetables can be eaten raw or lightly drizzled with oil and cooked  - Vegetables common to the traditional Mediterranean Diet include: artichokes, arugula, beets, broccoli, brussel sprouts, cabbage, carrots, celery, collard greens, cucumbers, eggplant, kale, leeks, lemons, lettuce, mushrooms, okra, onions, peas, peppers, potatoes, pumpkin, radishes, rutabaga, shallots, spinach, sweet potatoes, turnips, zucchini - Fruits common to the Mediterranean Diet include: apples, apricots, avocados, cherries, clementines, dates, figs, grapefruits, grapes, melons, nectarines, oranges, peaches, pears, pomegranates, strawberries, tangerines  Fats - Replace butter and margarine with healthy oils, such as olive oil, canola  oil, and tahini  - Limit nuts to no more than a handful a day  - Nuts include walnuts, almonds, pecans, pistachios, pine nuts  - Limit or avoid candied, honey roasted or heavily salted nuts - Olives are central to the [employer]iterranean diet - can be eaten whole or used in a variety of dishes   Meats Protein - Limiting red meat: no more than a few times a month - When eating red meat: choose lean cuts and keep the portion to the size of deck of cards - Eggs: approx. 0 to 4 times a week  - Fish and lean poultry: at least 2 a week  - Healthy protein sources include, chicken, [place], lean beef, lamb - Increase intake of seafood such as tuna, salmon, trout, mackerel, shrimp, scallops - Avoid or limit high fat processed meats such as sausage and bacon  Dairy - Include moderate amounts of low fat dairy products  - Focus on healthy dairy such as fat free yogurt, skim milk, low or reduced fat cheese - Limit dairy products higher in fat such as whole or 2% milk, cheese, ice cream  Alcohol - Moderate amounts of red wine is ok  - No more than 5 oz daily for women (all ages) and men older than age 58  - No more than 10 oz of wine daily for men younger than 165  Other - Limit sweets and other desserts  - Use herbs and spices instead of salt to flavor foods  - Herbs and spices common to the traditional Mediterranean Diet include: basil, bay leaves, chives, cloves, cumin, fennel, garlic, lavender, marjoram, mint, oregano, parsley, pepper, rosemary, sage, savory, sumac, tarragon, thyme   It's not  just a diet, it's a lifestyle:  . The Mediterranean diet includes lifestyle factors typical of those in the region  . Foods, drinks and meals are best eaten with others and savored . Daily physical activity is important for overall good health . This could be strenuous exercise like running and aerobics . This could also be more leisurely activities such as walking, housework, yard-work, or taking the  stairs . Moderation is the key; a balanced and healthy diet accommodates most foods and drinks . Consider portion sizes and frequency of consumption of certain foods   Meal Ideas & Options:  . Breakfast:  o Whole wheat toast or whole wheat English muffins with peanut butter & hard boiled egg o Steel cut oats topped with apples & cinnamon and skim milk  o Fresh fruit: banana, strawberries, melon, berries, peaches  o Smoothies: strawberries, bananas, greek yogurt, peanut butter o Low fat greek yogurt with blueberries and granola  o Egg white omelet with spinach and mushrooms o Breakfast couscous: whole wheat couscous, apricots, skim milk, cranberries  . Sandwiches:  o Hummus and grilled vegetables (peppers, zucchini, squash) on whole wheat bread   o Grilled chicken on whole wheat pita with lettuce, tomatoes, cucumbers or tzatziki  o Tuna salad on whole wheat bread: tuna salad made with greek yogurt, olives, red peppers, capers, green onions o Garlic rosemary lamb pita: lamb sauted with garlic, rosemary, salt & pepper; add lettuce, cucumber, greek yogurt to pita - flavor with lemon juice and black pepper  . Seafood:  o Mediterranean grilled salmon, seasoned with garlic, basil, parsley, lemon juice and black pepper o Shrimp, lemon, and spinach whole-grain pasta salad made with low fat greek yogurt  o Seared scallops with lemon orzo  o Seared tuna steaks seasoned salt, pepper, coriander topped with tomato mixture of olives, tomatoes, olive oil, minced garlic, parsley, green onions and cappers  . Meats:  o Herbed greek chicken salad with kalamata olives, cucumber, feta  o Red bell peppers stuffed with spinach, bulgur, lean ground beef (or lentils) & topped with feta   o Kebabs: skewers of chicken, tomatoes, onions, zucchini, squash  o [place] burgers: made with red onions, mint, dill, lemon juice, feta cheese topped with roasted red peppers . Vegetarian o Cucumber salad: cucumbers, artichoke  hearts, celery, red onion, feta cheese, tossed in olive oil & lemon juice  o Hummus and whole grain pita points with a greek salad (lettuce, tomato, feta, olives, cucumbers, red onion) o Lentil soup with celery, carrots made with vegetable broth, garlic, salt and pepper  o Tabouli salad: parsley, bulgur, mint, scallions, cucumbers, tomato, radishes, lemon juice, olive oil, salt and pepper.

## 2014-10-28 ENCOUNTER — Encounter: Payer: Self-pay | Admitting: *Deleted

## 2015-01-12 ENCOUNTER — Other Ambulatory Visit: Payer: Self-pay | Admitting: Internal Medicine

## 2015-01-13 NOTE — Telephone Encounter (Signed)
Last OV 3.22.16.  Please advise refill

## 2015-04-21 ENCOUNTER — Other Ambulatory Visit: Payer: Self-pay | Admitting: Internal Medicine

## 2015-04-29 ENCOUNTER — Encounter: Payer: Medicare Other | Admitting: Internal Medicine

## 2015-05-07 ENCOUNTER — Encounter: Payer: Self-pay | Admitting: Internal Medicine

## 2015-05-07 ENCOUNTER — Ambulatory Visit (INDEPENDENT_AMBULATORY_CARE_PROVIDER_SITE_OTHER): Payer: Medicare Other | Admitting: Internal Medicine

## 2015-05-07 VITALS — BP 120/70 | HR 66 | Temp 97.9°F | Ht 67.5 in | Wt 227.0 lb

## 2015-05-07 DIAGNOSIS — E669 Obesity, unspecified: Secondary | ICD-10-CM

## 2015-05-07 DIAGNOSIS — Z23 Encounter for immunization: Secondary | ICD-10-CM | POA: Diagnosis not present

## 2015-05-07 DIAGNOSIS — Z0000 Encounter for general adult medical examination without abnormal findings: Secondary | ICD-10-CM | POA: Diagnosis not present

## 2015-05-07 DIAGNOSIS — G8929 Other chronic pain: Secondary | ICD-10-CM

## 2015-05-07 DIAGNOSIS — Z0001 Encounter for general adult medical examination with abnormal findings: Secondary | ICD-10-CM | POA: Insufficient documentation

## 2015-05-07 DIAGNOSIS — D696 Thrombocytopenia, unspecified: Secondary | ICD-10-CM | POA: Diagnosis not present

## 2015-05-07 DIAGNOSIS — N433 Hydrocele, unspecified: Secondary | ICD-10-CM

## 2015-05-07 DIAGNOSIS — E785 Hyperlipidemia, unspecified: Secondary | ICD-10-CM | POA: Diagnosis not present

## 2015-05-07 DIAGNOSIS — B372 Candidiasis of skin and nail: Secondary | ICD-10-CM | POA: Diagnosis not present

## 2015-05-07 HISTORY — DX: Hydrocele, unspecified: N43.3

## 2015-05-07 LAB — CBC WITH DIFFERENTIAL/PLATELET
Basophils Absolute: 0 10*3/uL (ref 0.0–0.1)
Basophils Relative: 0.5 % (ref 0.0–3.0)
Eosinophils Absolute: 0.2 10*3/uL (ref 0.0–0.7)
Eosinophils Relative: 2.8 % (ref 0.0–5.0)
HCT: 44.2 % (ref 39.0–52.0)
Hemoglobin: 15.1 g/dL (ref 13.0–17.0)
Lymphocytes Relative: 29.4 % (ref 12.0–46.0)
Lymphs Abs: 2.5 10*3/uL (ref 0.7–4.0)
MCHC: 34.1 g/dL (ref 30.0–36.0)
MCV: 88.3 fl (ref 78.0–100.0)
Monocytes Absolute: 1.1 10*3/uL — ABNORMAL HIGH (ref 0.1–1.0)
Monocytes Relative: 12.9 % — ABNORMAL HIGH (ref 3.0–12.0)
Neutro Abs: 4.6 10*3/uL (ref 1.4–7.7)
Neutrophils Relative %: 54.4 % (ref 43.0–77.0)
Platelets: 172 10*3/uL (ref 150.0–400.0)
RBC: 5.01 Mil/uL (ref 4.22–5.81)
RDW: 13.3 % (ref 11.5–15.5)
WBC: 8.4 10*3/uL (ref 4.0–10.5)

## 2015-05-07 LAB — LIPID PANEL
Cholesterol: 168 mg/dL (ref 0–200)
HDL: 33.1 mg/dL — ABNORMAL LOW (ref >39.00)
NonHDL: 135.17
Total CHOL/HDL Ratio: 5
Triglycerides: 264 mg/dL — ABNORMAL HIGH (ref 0.0–149.0)
VLDL: 52.8 mg/dL — ABNORMAL HIGH (ref 0.0–40.0)

## 2015-05-07 LAB — COMPREHENSIVE METABOLIC PANEL
ALT: 27 U/L (ref 0–53)
AST: 27 U/L (ref 0–37)
Albumin: 4 g/dL (ref 3.5–5.2)
Alkaline Phosphatase: 45 U/L (ref 39–117)
BUN: 20 mg/dL (ref 6–23)
CO2: 27 mEq/L (ref 19–32)
Calcium: 9.2 mg/dL (ref 8.4–10.5)
Chloride: 104 mEq/L (ref 96–112)
Creatinine, Ser: 1.09 mg/dL (ref 0.40–1.50)
GFR: 73.76 mL/min (ref >60.00)
Glucose, Bld: 118 mg/dL — ABNORMAL HIGH (ref 70–99)
Potassium: 3.8 mEq/L (ref 3.5–5.1)
Sodium: 140 mEq/L (ref 135–145)
Total Bilirubin: 0.6 mg/dL (ref 0.2–1.2)
Total Protein: 6.5 g/dL (ref 6.0–8.3)

## 2015-05-07 LAB — LDL CHOLESTEROL, DIRECT: Direct LDL: 128 mg/dL

## 2015-05-07 LAB — PSA, MEDICARE: PSA: 0.9 ng/ml (ref 0.10–4.00)

## 2015-05-07 NOTE — Assessment & Plan Note (Signed)
General medical exam normal today. Will check PSA with labs today. We'll also check CBC, CMP, lipid profile. Colonoscopy UTD and reviewed. Encouraged continued efforts at healthy diet and increased physical activity. Discussed the importance of weight loss. Flu vaccine given today.

## 2015-05-07 NOTE — Progress Notes (Signed)
Pre visit review using our clinic review tool, if applicable. No additional management support is needed unless otherwise documented below in the visit note. 

## 2015-05-07 NOTE — Assessment & Plan Note (Signed)
Will check lipids and LFTs with labs. 

## 2015-05-07 NOTE — Assessment & Plan Note (Signed)
Repeat CBC with labs today. 

## 2015-05-07 NOTE — Assessment & Plan Note (Signed)
Exam c/w candidal dermatitis. Will start topical Lotrimin/Gold Bond powder. Follow up prn if symptoms not improving.

## 2015-05-07 NOTE — Assessment & Plan Note (Signed)
Chronic pain. Followed by Dr. Laban Emperor. Tolerating Oxycontin and Oxycodone well, except for occasional constipation. Continue to follow up with pain management.

## 2015-05-07 NOTE — Assessment & Plan Note (Signed)
Wt Readings from Last 3 Encounters:  05/07/15 227 lb (102.967 kg)  10/27/14 224 lb 2 oz (101.662 kg)  04/28/14 219 lb 4 oz (99.451 kg)   Body mass index is 35.01 kg/(m^2). Encouraged healthy diet and exercise. He has very limited dietary choices. Encouraged more intake of fruit and vegetables.

## 2015-05-07 NOTE — Assessment & Plan Note (Signed)
Exam c/w hydrocele. Will continue follow up and monitoring with urology.

## 2015-05-07 NOTE — Patient Instructions (Signed)

## 2015-05-07 NOTE — Assessment & Plan Note (Signed)
General physical exam normal today except as noted. Health maintenance UTD except for flu vaccine which was given today. Labs ordered. Follow up 6 months.

## 2015-05-07 NOTE — Progress Notes (Signed)
Subjective:    Patient ID: Mark [name], male    DOB: 03/27/1956, 59 y.o.   MRN: [id number]  HPI  The patient is here for annual Medicare wellness examination and management of other chronic and acute problems.   The risk factors are reflected in the social history.  The roster of all physicians providing medical care to patient - is listed in the Snapshot section of the chart.  Activities of daily living:  The patient is 100% independent in all ADLs: dressing, toileting, feeding as well as independent mobility. Lives alone in an apartment for 55 and older.  Home safety : The patient has smoke detectors and CO detectors in the home. They wear seatbelts.  There are no firearms at home. There is no violence in the home.   There is no risks for hepatitis, STDs or HIV. There is no history of blood transfusion. They have no travel history to infectious disease endemic areas of the world.  The patient has seen their dentist in the last six month.  Dentist - Dr. [place] They have seen their eye doctor in the last year. Followed recently for cataract. Opthalmology - Dr. Faylene Kurtz No issues with hearing.  They have deferred audiologic testing in the last year.  Previously seen by Dr. Andee Poles in ENT for hearing issue, but this has resolved. Dr. Lady Gary - Cardiologist Pain management - Dr. Damian Leavell  They do not have excessive sun exposure. Discussed the need for sun protection: hats, long sleeves and use of sunscreen if there is significant sun exposure.   Diet: the importance of a healthy diet is discussed. They do have a relatively healthy diet. Cutting back on sweets and salt. Limited fruits and veggies. Trying to limit junk foods. Using whole grain products.  The benefits of regular aerobic exercise were discussed.  Limited by chronic pain issues. Walking some.  Depression screen: there are no signs or vegative symptoms of depression- irritability, change in appetite, anhedonia,  sadness/tearfullness.   Cognitive assessment: the patient manages all their financial and personal affairs and is actively engaged. They could relate day,date,year and events.  The following portions of the patient's history were reviewed and updated as appropriate: allergies, current medications, past family history, past medical history,  past surgical history, past social history  and problem list.  Visual acuity was not assessed per patient preference since he has regular follow up with ophthalmologist. Hearing and body mass index were assessed and reviewed.   During the course of the visit the patient was educated and counseled about appropriate screening and preventive services including : fall prevention , diabetes screening, nutrition counseling, colorectal cancer screening, and recommended immunizations.    OBESITY - Frustrated by lack of weight loss. Trying to curb appetite with use of Boost as meal replacement. Most meals consist of peanut butter sandwiches, steak, potatoes. He does not like vegetables, fruit. Walking more.  Testicular cyst - Present for years. Followed by Dr. Lonna Cobb. No recent change in size. No pain. No dysuria.   Rash - Red burning rash noted in groin area over last few weeks. He attributes this to weight gain. Using some hydrocortisone with no improvement. No fever, chills. No other rash.   Wt Readings from Last 3 Encounters:  05/07/15 227 lb (102.967 kg)  10/27/14 224 lb 2 oz (101.662 kg)  04/28/14 219 lb 4 oz (99.451 kg)   BP Readings from Last 3 Encounters:  05/07/15 120/70  10/27/14 143/84  04/28/14 100/72  Past Medical History  Diagnosis Date  . Chronic pain   . Depression   . Sleep apnea    Family History  Problem Relation Age of Onset  . Heart disease Mother   . Diabetes Mother   . Arthritis Mother   . Cancer Mother     Breast Cancer  . Heart block Father   . Heart disease Father   . Arthritis Father   . Cancer Father     skin  cancer   Past Surgical History  Procedure Laterality Date  . Tonsillectomy    . Carpal tunnel release  2006  . Ulnar nerve repair  2008    Left  . Cervical spine surgery  2008  . Heart block  2008    s/p pacemaker, followed by Dr. Lady Gary  . Rotator cuff repair     Social History   Social History  . Marital Status: Married    Spouse Name: N/A  . Number of Children: N/A  . Years of Education: N/A   Social History Main Topics  . Smoking status: Never Smoker   . Smokeless tobacco: None  . Alcohol Use: No  . Drug Use: No  . Sexual Activity: Not Asked   Other Topics Concern  . None   Social History Narrative   Lives with wife.    Review of Systems  Constitutional: Negative for fever, chills, activity change, appetite change, fatigue and unexpected weight change.  HENT: Negative for congestion, sore throat and trouble swallowing.   Eyes: Negative for visual disturbance.  Respiratory: Negative for cough and shortness of breath.   Cardiovascular: Negative for chest pain, palpitations and leg swelling.  Gastrointestinal: Negative for abdominal pain and abdominal distention.  Genitourinary: Negative for dysuria, urgency, frequency, flank pain, penile swelling, scrotal swelling, difficulty urinating, genital sores, penile pain and testicular pain.  Musculoskeletal: Positive for myalgias, back pain and arthralgias. Negative for gait problem.  Skin: Positive for color change and rash. Negative for wound.  Neurological: Negative for dizziness, syncope, weakness and light-headedness.  Hematological: Negative for adenopathy.  Psychiatric/Behavioral: Negative for sleep disturbance and dysphoric mood. The patient is not nervous/anxious.        Objective:    BP 120/70 mmHg  Pulse 66  Temp(Src) 97.9 F (36.6 C) (Oral)  Ht 5' 7.5" (1.715 m)  Wt 227 lb (102.967 kg)  BMI 35.01 kg/m2  SpO2 97% Physical Exam  Constitutional: He is oriented to person, place, and time. He appears  well-developed and well-nourished. No distress.  HENT:  Head: Normocephalic and atraumatic.  Right Ear: External ear normal.  Left Ear: External ear normal.  Nose: Nose normal.  Mouth/Throat: Oropharynx is clear and moist. No oropharyngeal exudate.  Eyes: Conjunctivae and EOM are normal. Pupils are equal, round, and reactive to light. Right eye exhibits no discharge. Left eye exhibits no discharge. No scleral icterus.  Neck: Normal range of motion. Neck supple. No tracheal deviation present. No thyromegaly present.  Cardiovascular: Normal rate, regular rhythm and normal heart sounds.  Exam reveals no gallop and no friction rub.   No murmur heard. Pulmonary/Chest: Effort normal and breath sounds normal. No respiratory distress. He has no wheezes. He has no rales. He exhibits no tenderness.  Abdominal: Soft. Bowel sounds are normal. He exhibits no distension and no mass. There is no tenderness. There is no rebound and no guarding. Hernia confirmed negative in the right inguinal area and confirmed negative in the left inguinal area.  Genitourinary: Penis normal.  Right testis shows no mass, no swelling and no tenderness. Right testis is descended. Cremasteric reflex is not absent on the right side. Left testis shows mass. Left testis shows no swelling and no tenderness. Left testis is descended. Cremasteric reflex is not absent on the left side. Circumcised. No penile erythema or penile tenderness. No discharge found.  Musculoskeletal: Normal range of motion. He exhibits no edema.  Lymphadenopathy:    He has no cervical adenopathy.       Right: No inguinal adenopathy present.       Left: No inguinal adenopathy present.  Neurological: He is alert and oriented to person, place, and time. No cranial nerve deficit. Coordination normal.  Skin: Skin is warm and dry. Rash noted. Rash is maculopapular. He is not diaphoretic. No erythema. No pallor.  Psychiatric: He has a normal mood and affect. His  behavior is normal. Judgment and thought content normal.          Assessment & Plan:  Patient was given a handout regarding current recommendations for health maintenance and preventative care on the AVS.  Problem List Items Addressed This Visit      Unprioritized   Candidal dermatitis    Exam c/w candidal dermatitis. Will start topical Lotrimin/Gold Bond powder. Follow up prn if symptoms not improving.      Chronic pain    Chronic pain. Followed by Dr. Laban Emperor. Tolerating Oxycontin and Oxycodone well, except for occasional constipation. Continue to follow up with pain management.      Hydrocele in adult    Exam c/w hydrocele. Will continue follow up and monitoring with urology.      Hyperlipidemia    Will check lipids and LFTs with labs.      Relevant Orders   Comprehensive metabolic panel   Lipid panel   Medicare annual wellness visit, subsequent - Primary    General medical exam normal today. Will check PSA with labs today. We'll also check CBC, CMP, lipid profile. Colonoscopy UTD and reviewed. Encouraged continued efforts at healthy diet and increased physical activity. Discussed the importance of weight loss. Flu vaccine given today.          Relevant Orders   PSA, Medicare   Obesity (BMI 30-39.9)    Wt Readings from Last 3 Encounters:  05/07/15 227 lb (102.967 kg)  10/27/14 224 lb 2 oz (101.662 kg)  04/28/14 219 lb 4 oz (99.451 kg)   Body mass index is 35.01 kg/(m^2). Encouraged healthy diet and exercise. He has very limited dietary choices. Encouraged more intake of fruit and vegetables.      Routine general medical examination at a health care facility    General physical exam normal today except as noted. Health maintenance UTD except for flu vaccine which was given today. Labs ordered. Follow up 6 months.      Thrombocytopenia    Repeat CBC with labs today.      Relevant Orders   CBC with Differential/Platelet       Return in about 6 months  (around 11/04/2015) for Recheck.

## 2015-05-31 ENCOUNTER — Ambulatory Visit: Payer: Medicare Other | Attending: Pain Medicine | Admitting: Pain Medicine

## 2015-05-31 ENCOUNTER — Encounter: Payer: Self-pay | Admitting: Pain Medicine

## 2015-05-31 VITALS — BP 123/73 | HR 71 | Temp 97.8°F | Resp 18 | Ht 68.0 in | Wt 227.0 lb

## 2015-05-31 DIAGNOSIS — E669 Obesity, unspecified: Secondary | ICD-10-CM | POA: Diagnosis not present

## 2015-05-31 DIAGNOSIS — G894 Chronic pain syndrome: Secondary | ICD-10-CM | POA: Diagnosis not present

## 2015-05-31 DIAGNOSIS — E785 Hyperlipidemia, unspecified: Secondary | ICD-10-CM | POA: Insufficient documentation

## 2015-05-31 DIAGNOSIS — F329 Major depressive disorder, single episode, unspecified: Secondary | ICD-10-CM | POA: Diagnosis not present

## 2015-05-31 DIAGNOSIS — Z79891 Long term (current) use of opiate analgesic: Secondary | ICD-10-CM

## 2015-05-31 DIAGNOSIS — F1190 Opioid use, unspecified, uncomplicated: Secondary | ICD-10-CM

## 2015-05-31 DIAGNOSIS — M25569 Pain in unspecified knee: Secondary | ICD-10-CM | POA: Diagnosis present

## 2015-05-31 DIAGNOSIS — Z5181 Encounter for therapeutic drug level monitoring: Secondary | ICD-10-CM | POA: Insufficient documentation

## 2015-05-31 DIAGNOSIS — M542 Cervicalgia: Secondary | ICD-10-CM | POA: Diagnosis present

## 2015-05-31 DIAGNOSIS — Z950 Presence of cardiac pacemaker: Secondary | ICD-10-CM | POA: Diagnosis not present

## 2015-05-31 DIAGNOSIS — G8929 Other chronic pain: Secondary | ICD-10-CM

## 2015-05-31 DIAGNOSIS — M79603 Pain in arm, unspecified: Secondary | ICD-10-CM

## 2015-05-31 DIAGNOSIS — M25519 Pain in unspecified shoulder: Secondary | ICD-10-CM | POA: Insufficient documentation

## 2015-05-31 DIAGNOSIS — M25562 Pain in left knee: Secondary | ICD-10-CM | POA: Diagnosis not present

## 2015-05-31 DIAGNOSIS — F419 Anxiety disorder, unspecified: Secondary | ICD-10-CM | POA: Insufficient documentation

## 2015-05-31 DIAGNOSIS — M25561 Pain in right knee: Secondary | ICD-10-CM | POA: Diagnosis not present

## 2015-05-31 DIAGNOSIS — T402X5A Adverse effect of other opioids, initial encounter: Secondary | ICD-10-CM

## 2015-05-31 DIAGNOSIS — F1120 Opioid dependence, uncomplicated: Secondary | ICD-10-CM

## 2015-05-31 DIAGNOSIS — K5903 Drug induced constipation: Secondary | ICD-10-CM

## 2015-05-31 HISTORY — DX: Presence of cardiac pacemaker: Z95.0

## 2015-05-31 MED ORDER — OXYCODONE HCL 5 MG PO TABS: 5.0000 mg | ORAL_TABLET | Freq: Four times a day (QID) | ORAL | Status: DC | PRN

## 2015-05-31 MED ORDER — OXYCODONE HCL 5 MG PO TABS: 5.0000 mg | ORAL_TABLET | Freq: Four times a day (QID) | ORAL | Status: DC

## 2015-05-31 MED ORDER — OXYCODONE HCL ER 10 MG PO T12A: 10.0000 mg | EXTENDED_RELEASE_TABLET | Freq: Two times a day (BID) | ORAL | Status: DC

## 2015-05-31 NOTE — Progress Notes (Signed)
Safety precautions to be maintained throughout the outpatient stay will include: orient to surroundings, keep bed in low position, maintain call bell within reach at all times, provide assistance with transfer out of bed and ambulation.  

## 2015-05-31 NOTE — Progress Notes (Signed)
Oxycontin pill count #23/60 Oxycodone pill count # 34/120

## 2015-05-31 NOTE — Progress Notes (Signed)
Patient's Name: Mark [name] MRN: [id number] DOB: 12/24/1955 DOS: 05/31/2015  Primary Reason(s) for Visit: Encounter for Medication Management. CC: Knee Pain; Neck Pain; Shoulder Pain; and Arm Pain   HPI:   Mark [name] is a 59 y.o. year old, male patient, who returns today as an established patient. He has Chronic pain; Depression; Insomnia; Anxiety; Medicare annual wellness visit, subsequent; Hyperlipidemia; Thrombocytopenia (HCC); Obesity (BMI 30-39.9); Elevated BP; Routine general medical examination at a health care facility; Hydrocele in adult; Candidal dermatitis; Benign prostatic hyperplasia with urinary obstruction; BP (high blood pressure); Nephrolithiasis; Bradycardia; Cardiac murmur; Chronic pain syndrome; Opiate use; Opiate dependence (HCC); Long term prescription opiate use; Long term current use of opiate analgesic; Encounter for therapeutic drug level monitoring; Chronic neck pain; Chronic shoulder pain; Bilateral chronic knee pain; Chronic arm pain; Obesity; History of permanent cardiac pacemaker placement; and Therapeutic opioid-induced constipation (OIC) on his problem list.. His primarily concern today is the Knee Pain; Neck Pain; Shoulder Pain; and Arm Pain   The patient refers that he is doing good with the medications. The only problem that he is having some constipation but he is now using a MiraLAX related product that is keeping him regular. I offered him to prescribe something for the opiate-induced constipation but he declined. He indicated that as long as he can continue to manage it with over-the-counter medications he much rather do that.  Pharmacotherapy Review: Side-effects or Adverse reactions: None reported. Effectiveness: Described as relatively effective, allowing for increase in activities of daily living (ADL). Onset of action: Within expected pharmacological parameters. Duration of action: Within normal limits for medication. Peak effect: Timing and results are  as within normal expected parameters. [place] PMP: Compliant with practice rules and regulations. DST: Compliant with practice rules and regulations. Lab work: No new labs ordered by our practice. Treatment compliance: Compliant. Substance Use Disorder (SUD) Risk Level: Low Planned course of action: Continue therapy as is.  Allergies: Mark [name] is allergic to ciprofloxacin; morphine and related; and penicillins.  Meds: The patient has a current medication list which includes the following prescription(s): aspirin, b complex vitamins, cetirizine, vitamin d3, clindamycin, clonazepam, desonide, fluoxetine, fluoxetine, fluticasone, furosemide, garlic, multiple vitamins-minerals, naproxen, fish oil, omeprazole, oxycodone, oxycodone, saw palmetto, sildenafil, oxycodone, oxycodone, oxycodone, and oxycodone. Requested Prescriptions   Signed Prescriptions Disp Refills  . OxyCODONE (OXYCONTIN) 10 mg T12A 12 hr tablet 60 tablet 0    Sig: Take 1 tablet (10 mg total) by mouth every 12 (twelve) hours.  Marland Kitchen oxyCODONE (OXY IR/ROXICODONE) 5 MG immediate release tablet 120 tablet 0    Sig: Take 1 tablet (5 mg total) by mouth every 6 (six) hours.  . OxyCODONE (OXYCONTIN) 10 mg T12A 12 hr tablet 60 tablet 0    Sig: Take 1 tablet (10 mg total) by mouth every 12 (twelve) hours.  Marland Kitchen oxyCODONE (OXY IR/ROXICODONE) 5 MG immediate release tablet 120 tablet 0    Sig: Take 1 tablet (5 mg total) by mouth every 6 (six) hours as needed for severe pain.  . OxyCODONE (OXYCONTIN) 10 mg T12A 12 hr tablet 60 tablet 0    Sig: Take 1 tablet (10 mg total) by mouth every 12 (twelve) hours.  Marland Kitchen oxyCODONE (OXY IR/ROXICODONE) 5 MG immediate release tablet 120 tablet 0    Sig: Take 1 tablet (5 mg total) by mouth every 6 (six) hours as needed for severe pain.    ROS: Constitutional: Afebrile, no chills, well hydrated and well nourished Gastrointestinal: negative Musculoskeletal:negative Neurological: negative Behavioral/Psych:  negative  PFSH: Medical:  Mark [name]  has a past medical history of Chronic pain; Depression; Sleep apnea; Kidney stones; Prostatitis; Anxiety; Pacemaker; Migraine; and History of permanent cardiac pacemaker placement (05/31/2015). Family: family history includes Arthritis in his father and mother; Cancer in his father and mother; Diabetes in his mother; Heart block in his father; Heart disease in his father and mother. Surgical:  has past surgical history that includes Tonsillectomy; Carpal tunnel release (2006); Ulnar nerve repair (2008); Cervical spine surgery (2008); heart block (2008); Rotator cuff repair; and Pacemaker insertion. Tobacco:  reports that he has never smoked. He does not have any smokeless tobacco history on file. Alcohol:  reports that he does not drink alcohol. Drug:  reports that he does not use illicit drugs.  Physical Exam: Vitals:  Today's Vitals   05/31/15 1318 05/31/15 1321  BP: 123/73   Pulse: 71   Temp: 97.8 F (36.6 C)   Resp: 18   Height:  (1.727 m)   Weight: 227 lb (102.967 kg)   SpO2: 99%   PainSc: 6  6   Calculated BMI: Body mass index is 34.52 kg/(m^2). General appearance: alert, cooperative, appears stated age, no distress and moderately obese Eyes: conjunctivae/corneas clear. PERRL, EOM's intact. Fundi benign. Lungs: No evidence respiratory distress, no audible rales or ronchi and no use of accessory muscles of respiration Neck: no adenopathy, no carotid bruit, no JVD, supple, symmetrical, trachea midline and thyroid not enlarged, symmetric, no tenderness/mass/nodules Back: symmetric, no curvature. ROM normal. No CVA tenderness. Extremities: extremities normal, atraumatic, no cyanosis or edema Pulses: 2+ and symmetric Skin: Skin color, texture, turgor normal. No rashes or lesions Neurologic: Grossly normal    Assessment: Encounter Diagnosis:  Primary Diagnosis: Chronic pain syndrome [G89.4]  Plan: Mark [name] was seen today for knee pain,  neck pain, shoulder pain and arm pain.  Diagnoses and all orders for this visit:  Chronic pain syndrome  Opiate use  Uncomplicated opioid dependence (HCC)  Long term prescription opiate use  Long term current use of opiate analgesic -     Drugs of abuse screen w/o alc, rtn urine-sln; Future  Encounter for therapeutic drug level monitoring  Chronic neck pain  Chronic shoulder pain, unspecified laterality  Bilateral chronic knee pain  Chronic upper extremity pain, unspecified laterality  Obesity  History of permanent cardiac pacemaker placement  Chronic pain -     OxyCODONE (OXYCONTIN) 10 mg T12A 12 hr tablet; Take 1 tablet (10 mg total) by mouth every 12 (twelve) hours. -     oxyCODONE (OXY IR/ROXICODONE) 5 MG immediate release tablet; Take 1 tablet (5 mg total) by mouth every 6 (six) hours. -     OxyCODONE (OXYCONTIN) 10 mg T12A 12 hr tablet; Take 1 tablet (10 mg total) by mouth every 12 (twelve) hours. -     oxyCODONE (OXY IR/ROXICODONE) 5 MG immediate release tablet; Take 1 tablet (5 mg total) by mouth every 6 (six) hours as needed for severe pain. -     OxyCODONE (OXYCONTIN) 10 mg T12A 12 hr tablet; Take 1 tablet (10 mg total) by mouth every 12 (twelve) hours. -     oxyCODONE (OXY IR/ROXICODONE) 5 MG immediate release tablet; Take 1 tablet (5 mg total) by mouth every 6 (six) hours as needed for severe pain.  Therapeutic opioid-induced constipation (OIC)     There are no Patient Instructions on file for this visit. Medications discontinued today:  Medications Discontinued During This Encounter  Medication Reason  .  CRANBERRY PO Error  . OxyCODONE (OXYCONTIN) 10 mg T12A 12 hr tablet Reorder  . oxyCODONE (OXY IR/ROXICODONE) 5 MG immediate release tablet Reorder   Medications administered today:  Mark [name] had no medications administered during this visit.  Primary Care Physician: Wynona DoveWALKER,JENNIFER AZBELL, MD [facility] Note  by: Sydnee LevansFrancisco A. Laban EmperorNaveira, M.D, DABA, DABAPM, DABPM, DABIPP, FIPP

## 2015-06-01 ENCOUNTER — Encounter: Payer: Self-pay | Admitting: Pain Medicine

## 2015-06-24 ENCOUNTER — Other Ambulatory Visit: Payer: Self-pay | Admitting: Pain Medicine

## 2015-07-12 ENCOUNTER — Telehealth: Payer: Self-pay | Admitting: Pain Medicine

## 2015-07-12 NOTE — Telephone Encounter (Signed)
[employer] will not cover meds w/o prior auth  / please let patient know when this has been completed

## 2015-07-12 NOTE — Telephone Encounter (Signed)
Kori informed. States she will do prior [place] when [facility].

## 2015-07-21 ENCOUNTER — Other Ambulatory Visit: Payer: Self-pay | Admitting: Internal Medicine

## 2015-07-21 MED ORDER — CLONAZEPAM 1 MG PO TABS: ORAL_TABLET | ORAL | Status: DC

## 2015-07-22 NOTE — Telephone Encounter (Signed)
Notified pt that medications have been refilled, LMOM / tvw

## 2015-08-11 ENCOUNTER — Telehealth: Payer: Self-pay | Admitting: Internal Medicine

## 2015-08-11 NOTE — Telephone Encounter (Signed)
No. I don't do this. If he needs a pet for emotional support, then he will need to see a psychiatrist.

## 2015-08-11 NOTE — Telephone Encounter (Signed)
Please advise, do we do this?

## 2015-08-11 NOTE — Telephone Encounter (Signed)
Pt called wanting to know if Dr Dan HumphreysWalker would write a letter to his housing complex to get permission to have a pet? Does pt need to come in for the letter? Thank You!

## 2015-08-12 ENCOUNTER — Telehealth: Payer: Self-pay | Admitting: *Deleted

## 2015-08-12 NOTE — Telephone Encounter (Signed)
Left vm to call the office

## 2015-08-27 ENCOUNTER — Encounter: Payer: Self-pay | Admitting: Internal Medicine

## 2015-08-27 ENCOUNTER — Ambulatory Visit (INDEPENDENT_AMBULATORY_CARE_PROVIDER_SITE_OTHER): Payer: Medicare Other | Admitting: Internal Medicine

## 2015-08-27 VITALS — BP 124/86 | HR 84 | Temp 98.3°F | Resp 20 | Ht 68.0 in | Wt 227.5 lb

## 2015-08-27 DIAGNOSIS — E785 Hyperlipidemia, unspecified: Secondary | ICD-10-CM

## 2015-08-27 DIAGNOSIS — I10 Essential (primary) hypertension: Secondary | ICD-10-CM

## 2015-08-27 DIAGNOSIS — R7309 Other abnormal glucose: Secondary | ICD-10-CM

## 2015-08-27 DIAGNOSIS — G8929 Other chronic pain: Secondary | ICD-10-CM | POA: Diagnosis not present

## 2015-08-27 DIAGNOSIS — R739 Hyperglycemia, unspecified: Secondary | ICD-10-CM

## 2015-08-27 MED ORDER — METFORMIN HCL 500 MG PO TABS: 500.0000 mg | ORAL_TABLET | Freq: Two times a day (BID) | ORAL | Status: DC

## 2015-08-27 NOTE — Progress Notes (Signed)
Subjective:    Patient ID: Mark [name], male    DOB: [birth year], 60 y.o.   MRN: [id number]  HPI  60YO male presents for follow up.  Upset about elevated blood sugars. Has been checking his blood sugars at home. BG running in 120-170s. Upset that this was not detected in our previous labs. Notably, last A1c 4.9%. Also upset that his elevated triglycerides over the last several years have not been addressed. TG mostly near 200. He reports that there is no way he will ever change diet to help with blood sugar or cholesterol. He does not exercise. He reports that Dr. Lady Gary added Lipitor to regimen. He has not yet started this.  He is unwilling to change his diet. Eats no vegetables or fruit. Does not exercise. Typical diet - peanut butter sandwich with tang, nabs for snack, supper same or sometimes hamburger.  Chronic pain - Continues on chronic opiates through pain management. Having some issues with constipation. Improved with stool softeners.  Would like to have a letter saying that he needs a support dog.  Wt Readings from Last 3 Encounters:  08/27/15 227 lb 8 oz (103.193 kg)  05/31/15 227 lb (102.967 kg)  05/07/15 227 lb (102.967 kg)   BP Readings from Last 3 Encounters:  08/27/15 124/86  05/31/15 123/73  05/07/15 120/70    Past Medical History  Diagnosis Date  . Chronic pain   . Depression   . Sleep apnea   . Kidney stones   . Prostatitis   . Anxiety   . Pacemaker   . Migraine   . History of permanent cardiac pacemaker placement 05/31/2015   Family History  Problem Relation Age of Onset  . Heart disease Mother   . Diabetes Mother   . Arthritis Mother   . Cancer Mother     Breast Cancer  . Heart block Father   . Heart disease Father   . Arthritis Father   . Cancer Father     skin cancer   Past Surgical History  Procedure Laterality Date  . Tonsillectomy    . Carpal tunnel release  2006  . Ulnar nerve repair  2008    Left  . Cervical spine surgery  2008    . Heart block  2008    s/p pacemaker, followed by Dr. Lady Gary  . Rotator cuff repair    . Pacemaker insertion     Social History   Social History  . Marital Status: Married    Spouse Name: N/A  . Number of Children: N/A  . Years of Education: N/A   Social History Main Topics  . Smoking status: Never Smoker   . Smokeless tobacco: None  . Alcohol Use: No  . Drug Use: No  . Sexual Activity: Not Asked   Other Topics Concern  . None   Social History Narrative   Lives with wife.    Review of Systems  Constitutional: Negative for fever, chills, activity change, appetite change, fatigue and unexpected weight change.  Eyes: Negative for visual disturbance.  Respiratory: Negative for cough and shortness of breath.   Cardiovascular: Negative for chest pain, palpitations and leg swelling.  Gastrointestinal: Positive for constipation. Negative for nausea, vomiting, abdominal pain, diarrhea and abdominal distention.  Genitourinary: Negative for dysuria, urgency and difficulty urinating.  Musculoskeletal: Positive for myalgias and arthralgias. Negative for gait problem.  Skin: Negative for color change and rash.  Hematological: Negative for adenopathy.  Psychiatric/Behavioral: Negative for suicidal ideas, sleep  disturbance and dysphoric mood. The patient is not nervous/anxious.        Objective:    BP 124/86 mmHg  Pulse 84  Temp(Src) 98.3 F (36.8 C) (Oral)  Resp 20  Ht  (1.727 m)  Wt 227 lb 8 oz (103.193 kg)  BMI 34.60 kg/m2  SpO2 97% Physical Exam  Constitutional: He is oriented to person, place, and time. He appears well-developed and well-nourished. No distress.  HENT:  Head: Normocephalic and atraumatic.  Right Ear: External ear normal.  Left Ear: External ear normal.  Nose: Nose normal.  Mouth/Throat: Oropharynx is clear and moist. No oropharyngeal exudate.  Eyes: Conjunctivae and EOM are normal. Pupils are equal, round, and reactive to light. Right eye exhibits  no discharge. Left eye exhibits no discharge. No scleral icterus.  Neck: Normal range of motion. Neck supple. No tracheal deviation present. No thyromegaly present.  Cardiovascular: Normal rate, regular rhythm and normal heart sounds.  Exam reveals no gallop and no friction rub.   No murmur heard. Pulmonary/Chest: Effort normal and breath sounds normal. No accessory muscle usage. No tachypnea. No respiratory distress. He has no decreased breath sounds. He has no wheezes. He has no rhonchi. He has no rales. He exhibits no tenderness.  Musculoskeletal: Normal range of motion. He exhibits no edema.  Lymphadenopathy:    He has no cervical adenopathy.  Neurological: He is alert and oriented to person, place, and time. No cranial nerve deficit. Coordination normal.  Skin: Skin is warm and dry. No rash noted. He is not diaphoretic. No erythema. No pallor.  Psychiatric: Judgment and thought content normal. His affect is angry. He is agitated.          Assessment & Plan:   Problem List Items Addressed This Visit      Unprioritized   BP (high blood pressure)    BP Readings from Last 3 Encounters:  08/27/15 124/86  05/31/15 123/73  05/07/15 120/70   BP well controlled. Renal function with labs today. Continue current medications.      Relevant Medications   atorvastatin (LIPITOR) 20 MG tablet   Other Relevant Orders   Hemoglobin A1c   Chronic pain (Chronic)    Followed by pain management. Chronic narcotics. Chronic constipation from narcotics improved with stool softeners.      Relevant Orders   Hemoglobin A1c   Elevated blood sugar - Primary    Elevated fingerstick blood sugars. Previous A1c was 4.9%. Will repeat A1c today. Start Metformin  po bid.  Encouraged him to consider nutrition counseling. He declines.       Relevant Medications   metFORMIN (GLUCOPHAGE) 500 MG tablet   Other Relevant Orders   Hemoglobin A1c   Hyperlipidemia    TG elevated on lipids over the last  years, mostly 150-200s. Recently started on Atorvastatin by cardiology. Refuses any consideration of dietary intervention.      Relevant Medications   atorvastatin (LIPITOR) 20 MG tablet   Other Relevant Orders   Hemoglobin A1c       Return in about 3 months (around 11/25/2015) for Recheck of Diabetes.

## 2015-08-27 NOTE — Assessment & Plan Note (Signed)
BP Readings from Last 3 Encounters:  08/27/15 124/86  05/31/15 123/73  05/07/15 120/70   BP well controlled. Renal function with labs today. Continue current medications.

## 2015-08-27 NOTE — Progress Notes (Signed)
Pre visit review using our clinic review tool, if applicable. No additional management support is needed unless otherwise documented below in the visit note. 

## 2015-08-27 NOTE — Assessment & Plan Note (Signed)
Followed by pain management. Chronic narcotics. Chronic constipation from narcotics improved with stool softeners.

## 2015-08-27 NOTE — Patient Instructions (Addendum)
Labs today to check A1c.  Goal fasting blood sugar 80-120.  Goal blood sugar 1-2hr after a meal less than 150.  Start Metformin  twice daily.  Try to limit carbohydrate intake to 35gm with a meal and 15gm with a meal.

## 2015-08-27 NOTE — Assessment & Plan Note (Signed)
TG elevated on lipids over the last years, mostly 150-200s. Recently started on Atorvastatin by cardiology. Refuses any consideration of dietary intervention.

## 2015-08-27 NOTE — Assessment & Plan Note (Signed)
Elevated fingerstick blood sugars. Previous A1c was 4.9%. Will repeat A1c today. Start Metformin  po bid.  Encouraged him to consider nutrition counseling. He declines.

## 2015-08-28 LAB — HEMOGLOBIN A1C
Hgb A1c MFr Bld: 5.3 % (ref <5.7)
Mean Plasma Glucose: 105 mg/dL (ref <117)

## 2015-09-01 ENCOUNTER — Encounter: Payer: Self-pay | Admitting: Pain Medicine

## 2015-09-01 ENCOUNTER — Other Ambulatory Visit: Payer: Self-pay | Admitting: Pain Medicine

## 2015-09-01 ENCOUNTER — Ambulatory Visit: Payer: Medicare Other | Attending: Pain Medicine | Admitting: Pain Medicine

## 2015-09-01 VITALS — BP 123/68 | HR 71 | Temp 98.1°F | Resp 16 | Ht 68.0 in | Wt 227.0 lb

## 2015-09-01 DIAGNOSIS — M25562 Pain in left knee: Secondary | ICD-10-CM | POA: Diagnosis not present

## 2015-09-01 DIAGNOSIS — M25511 Pain in right shoulder: Secondary | ICD-10-CM | POA: Diagnosis not present

## 2015-09-01 DIAGNOSIS — M25512 Pain in left shoulder: Secondary | ICD-10-CM | POA: Insufficient documentation

## 2015-09-01 DIAGNOSIS — Z950 Presence of cardiac pacemaker: Secondary | ICD-10-CM | POA: Diagnosis not present

## 2015-09-01 DIAGNOSIS — M5412 Radiculopathy, cervical region: Secondary | ICD-10-CM

## 2015-09-01 DIAGNOSIS — G43909 Migraine, unspecified, not intractable, without status migrainosus: Secondary | ICD-10-CM | POA: Insufficient documentation

## 2015-09-01 DIAGNOSIS — M47892 Other spondylosis, cervical region: Secondary | ICD-10-CM | POA: Diagnosis not present

## 2015-09-01 DIAGNOSIS — F419 Anxiety disorder, unspecified: Secondary | ICD-10-CM | POA: Diagnosis not present

## 2015-09-01 DIAGNOSIS — F329 Major depressive disorder, single episode, unspecified: Secondary | ICD-10-CM | POA: Insufficient documentation

## 2015-09-01 DIAGNOSIS — D696 Thrombocytopenia, unspecified: Secondary | ICD-10-CM | POA: Diagnosis not present

## 2015-09-01 DIAGNOSIS — N433 Hydrocele, unspecified: Secondary | ICD-10-CM | POA: Diagnosis not present

## 2015-09-01 DIAGNOSIS — E785 Hyperlipidemia, unspecified: Secondary | ICD-10-CM | POA: Diagnosis not present

## 2015-09-01 DIAGNOSIS — G4700 Insomnia, unspecified: Secondary | ICD-10-CM | POA: Insufficient documentation

## 2015-09-01 DIAGNOSIS — G8929 Other chronic pain: Secondary | ICD-10-CM | POA: Diagnosis not present

## 2015-09-01 DIAGNOSIS — M47812 Spondylosis without myelopathy or radiculopathy, cervical region: Secondary | ICD-10-CM | POA: Insufficient documentation

## 2015-09-01 DIAGNOSIS — Z5181 Encounter for therapeutic drug level monitoring: Secondary | ICD-10-CM

## 2015-09-01 DIAGNOSIS — R51 Headache: Secondary | ICD-10-CM

## 2015-09-01 DIAGNOSIS — M5126 Other intervertebral disc displacement, lumbar region: Secondary | ICD-10-CM | POA: Insufficient documentation

## 2015-09-01 DIAGNOSIS — F1120 Opioid dependence, uncomplicated: Secondary | ICD-10-CM | POA: Diagnosis not present

## 2015-09-01 DIAGNOSIS — K5903 Drug induced constipation: Secondary | ICD-10-CM | POA: Insufficient documentation

## 2015-09-01 DIAGNOSIS — G4486 Cervicogenic headache: Secondary | ICD-10-CM | POA: Insufficient documentation

## 2015-09-01 DIAGNOSIS — M25519 Pain in unspecified shoulder: Secondary | ICD-10-CM | POA: Diagnosis present

## 2015-09-01 DIAGNOSIS — M961 Postlaminectomy syndrome, not elsewhere classified: Secondary | ICD-10-CM | POA: Insufficient documentation

## 2015-09-01 DIAGNOSIS — M5382 Other specified dorsopathies, cervical region: Secondary | ICD-10-CM

## 2015-09-01 DIAGNOSIS — E119 Type 2 diabetes mellitus without complications: Secondary | ICD-10-CM | POA: Diagnosis not present

## 2015-09-01 DIAGNOSIS — M47816 Spondylosis without myelopathy or radiculopathy, lumbar region: Secondary | ICD-10-CM | POA: Insufficient documentation

## 2015-09-01 DIAGNOSIS — Z79891 Long term (current) use of opiate analgesic: Secondary | ICD-10-CM | POA: Diagnosis not present

## 2015-09-01 DIAGNOSIS — R030 Elevated blood-pressure reading, without diagnosis of hypertension: Secondary | ICD-10-CM | POA: Insufficient documentation

## 2015-09-01 DIAGNOSIS — E669 Obesity, unspecified: Secondary | ICD-10-CM | POA: Insufficient documentation

## 2015-09-01 DIAGNOSIS — M25561 Pain in right knee: Secondary | ICD-10-CM

## 2015-09-01 DIAGNOSIS — Z981 Arthrodesis status: Secondary | ICD-10-CM | POA: Diagnosis not present

## 2015-09-01 DIAGNOSIS — N400 Enlarged prostate without lower urinary tract symptoms: Secondary | ICD-10-CM | POA: Diagnosis not present

## 2015-09-01 DIAGNOSIS — M542 Cervicalgia: Secondary | ICD-10-CM | POA: Diagnosis not present

## 2015-09-01 DIAGNOSIS — M4722 Other spondylosis with radiculopathy, cervical region: Secondary | ICD-10-CM

## 2015-09-01 LAB — COMPREHENSIVE METABOLIC PANEL
ALT: 33 U/L (ref 17–63)
AST: 38 U/L (ref 15–41)
Albumin: 4 g/dL (ref 3.5–5.0)
Alkaline Phosphatase: 50 U/L (ref 38–126)
Anion gap: 10 (ref 5–15)
BUN: 21 mg/dL — ABNORMAL HIGH (ref 6–20)
CO2: 26 mmol/L (ref 22–32)
Calcium: 9.4 mg/dL (ref 8.9–10.3)
Chloride: 102 mmol/L (ref 101–111)
Creatinine, Ser: 1.18 mg/dL (ref 0.61–1.24)
GFR calc Af Amer: >60 mL/min (ref >60)
GFR calc non Af Amer: >60 mL/min (ref >60)
Glucose, Bld: 101 mg/dL — ABNORMAL HIGH (ref 65–99)
Potassium: 3.8 mmol/L (ref 3.5–5.1)
Sodium: 138 mmol/L (ref 135–145)
Total Bilirubin: 1.2 mg/dL (ref 0.3–1.2)
Total Protein: 6.8 g/dL (ref 6.5–8.1)

## 2015-09-01 LAB — MAGNESIUM: Magnesium: 2.1 mg/dL (ref 1.7–2.4)

## 2015-09-01 LAB — SEDIMENTATION RATE: Sed Rate: 6 mm/hr (ref 0–20)

## 2015-09-01 MED ORDER — OXYCODONE HCL ER 10 MG PO T12A: 10.0000 mg | EXTENDED_RELEASE_TABLET | Freq: Two times a day (BID) | ORAL | Status: DC

## 2015-09-01 MED ORDER — OXYCODONE HCL 5 MG PO TABS: 5.0000 mg | ORAL_TABLET | Freq: Four times a day (QID) | ORAL | Status: DC | PRN

## 2015-09-01 MED ORDER — OXYCODONE HCL 5 MG PO TABS: 5.0000 mg | ORAL_TABLET | Freq: Four times a day (QID) | ORAL | Status: DC

## 2015-09-01 MED ORDER — OXYCODONE HCL ER 10 MG PO T12A
10.0000 mg | EXTENDED_RELEASE_TABLET | Freq: Two times a day (BID) | ORAL | Status: DC
Start: 2015-09-01 — End: 2015-11-29

## 2015-09-01 NOTE — Patient Instructions (Signed)
Instructed to go get labwork drawn at [facility] today.

## 2015-09-01 NOTE — Addendum Note (Signed)
Addended by: [name] A on: 09/01/2015 05:46 PM   Modules accepted: Orders

## 2015-09-01 NOTE — Progress Notes (Addendum)
Patient's Name: Mark [name] MRN: [id number] DOB: 1955-10-25 DOS: 09/01/2015  Primary Reason(s) for Visit: Encounter for Medication Management CC: Neck Pain and Shoulder Pain   HPI  Mark [name] is a 60 y.o. year old, male patient, who returns today as an established patient. He has Chronic pain; Depression; Insomnia; Anxiety; Medicare annual wellness visit, subsequent; Hyperlipidemia; Thrombocytopenia (HCC); Obesity (BMI 30-39.9); Elevated BP; Routine general medical examination at a health care facility; Hydrocele in adult; Candidal dermatitis; Benign prostatic hyperplasia with urinary obstruction; BP (high blood pressure); Nephrolithiasis; Bradycardia; Cardiac murmur; Chronic pain syndrome; Opiate use; Opiate dependence (HCC); Long term prescription opiate use; Long term current use of opiate analgesic; Encounter for therapeutic drug level monitoring; Chronic neck pain (Location of Primary Source of Pain) (Bilateral) (L>R); Chronic shoulder pain (Bilateral) (L>R); Chronic knee pain (Bilateral) (R>L); Chronic arm pain; Obesity; History of permanent cardiac pacemaker placement; Therapeutic opioid-induced constipation (OIC); Elevated blood sugar; Radicular pain of shoulder (Bilateral) (L>R); 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 His primarily concern today is the Neck Pain and Shoulder Pain   The patient returns to the clinics today for pharmacological management of his chronic pain. According to him his primary pain is in the neck region were the pain is on the back of the neck as well as both sides. He has a prior history of having had a right sided C5-6 ACDF done in 2008. Unfortunately he indicates that it did not help his pain and he continues to have the neck pain and bilateral shoulder pain. The shoulder pain is worse on the left side  when compared to the right. He admits to cracking and popping when he moves the neck and pain that goes to the occipital region, bilaterally. This causes him to have cervicogenic headaches starting in the occipital area. He indicates that the headaches feel like tension headaches.  The patient secondary pain is in both knees with the right being worst on the left. This was treated in the past that the [facility] by Dr. Ruthann Cancer. He has continued to go to the [facility] orthopedic department but now his case is being managed by a male orthopedic surgeon and Cranston Neighbor, PA-C. The patient indicates that in the past he has had 2 series of 3 Synvisc injections for the knee and they tend to work very well. I have offered him to do similar injections with the Hyalgan. I explained to him the difference between the 2 and the fact that Hyalgan is associated with less allergic reactions than the Synvisc.  Finally, the patient's third worst pain is in his feet with the right being just as bad as the left. He indicates that he only has this when he walks for a long period time and he believes that this is secondary to him being overweight and due to overuse. Having said this, the patient has pain diagnosed as having non-insulin-dependent diabetes mellitus and he was recently started on metformin. He indicates that his hemoglobin A1c has remained relatively normal but his blood sugars have been up. This is being treated by his primary care physician.   Reported Pain Score: 6  (last pain medicine 0600 today), clinically he looks like a 1-2/10. Reported level is inconsistent with clinical obrservations. Pain Type: Chronic pain Pain Location: Neck (shoulder) Pain Orientation: Other (Comment) (bilateral) Pain Descriptors / Indicators: Aching, Constant, Shooting, Sharp, Sore  Pain Frequency: Constant  Date of Last Visit: 05/31/15 Service Provided on Last Visit: Med Refill  Pharmacotherapy   Medication(s): His pain is being managed with OxyContin (oxycodone ER) 10 mg every 12 hours plus oxycodone IR 5 mg every 6 hours when necessary for breakthrough pain. He indicates that on the average she uses 3-4 of the immediate release tablets per day. Onset of action: Within expected pharmacological parameters Time to Peak effect: Timing and results are as within normal expected parameters Analgesic Effect: More than 50% Activity Facilitation: Medication(s) allow patient to sit, stand, walk, and do the basic ADLs Perceived Effectiveness: Described as relatively effective, allowing for increase in activities of daily living (ADL) Side-effects or Adverse reactions: None reported Duration of action: Within normal limits for medication [place] PMP: Compliant with practice rules and regulations UDS Results: Last UDS done on 05/31/2015 came back within normal limits with no unexpected results. UDS Interpretation: Patient appears to be compliant with practice rules and regulations Medication Assessment Form: Reviewed. Patient indicates being compliant with therapy Treatment compliance: Compliant Substance Use Disorder (SUD) Risk Level: Low Pharmacologic Plan: Continue therapy as is. When writing for the patient's OxyContin, this system flagged the medication as not being covered by his insurance company. This is a problem that we have had in the past and when it first started we attempted to use MS Contin (morphine ER) but the patient was unable to tolerated. He developed severe nausea and vomiting forcing [place] to immediately discontinue the medication. In addition, the patient tried Zohydro ER. This medication was ineffective in controlling his pain, expensive, and difficult to obtain. In reviewing the patient's PMP, there is evidence that this patient has been successfully treated with OxyContin since before 2011. Therefore, there is no point in trying to change his medications since he only thing that it  would do is to disrupt a perfectly functional therapy.  Lab Work: Illicit Drugs Lab Results  Component Value Date   COCAINSCRNUR NEG 03/28/2011   PCPSCRNUR NEG 03/28/2011   AMPHETMU NEG 03/28/2011   METHADONE NEG 03/28/2011    Inflammation Markers Lab Results  Component Value Date   ESRSEDRATE 6 09/01/2015    Renal Function Lab Results  Component Value Date   BUN 21* 09/01/2015   CREATININE 1.18 09/01/2015   GFRAA >60 09/01/2015   GFRNONAA >60 09/01/2015    Hepatic Function Lab Results  Component Value Date   AST 38 09/01/2015   ALT 33 09/01/2015   ALBUMIN 4.0 09/01/2015    Electrolytes Lab Results  Component Value Date   NA 138 09/01/2015   K 3.8 09/01/2015   CL 102 09/01/2015   CALCIUM 9.4 09/01/2015   MG 2.1 09/01/2015    Allergies  Mr. Venus is allergic to ciprofloxacin; morphine and related; and penicillins.  Meds  The patient has a current medication list which includes the following prescription(s): aspirin, atorvastatin, b complex vitamins, cetirizine, vitamin d3, clindamycin, clonazepam, desonide, fluoxetine, fluoxetine, fluticasone, furosemide, garlic, metformin, multiple vitamins-minerals, naproxen, fish oil, omeprazole, oxycodone, oxycodone, saw palmetto, sildenafil, oxycodone, oxycodone, oxycodone, and oxycodone.  Current Outpatient Prescriptions on File Prior to Visit  Medication Sig  . aspirin 81 MG tablet Take 81 mg by mouth daily.    Marland Kitchen atorvastatin (LIPITOR) 20 MG tablet Take 20 mg by mouth daily.  Marland Kitchen b complex vitamins tablet Take 1 tablet by mouth daily.    . cetirizine (ZYRTEC) 10 MG tablet Take 10 mg by mouth daily. As needed  . Cholecalciferol (VITAMIN D3)  1000 UNITS CAPS Take 2 Units by mouth daily.   . clindamycin (CLEOCIN) 150 MG capsule Take 150 mg by mouth daily. 4 caps 1 hour before dental procedures.   . clonazePAM (KLONOPIN) 1 MG tablet TAKE 1 BY MOUTH 3 TIMES DAILY AS NEEDED FOR ANXIETY  . desonide (DESOWEN) 0.05 % ointment  Apply topically 2 (two) times daily.  Marland Kitchen FLUoxetine (PROZAC) 20 MG capsule TAKE 1 BY MOUTH DAILY  . FLUoxetine (PROZAC) 40 MG capsule TAKE 1 BY MOUTH DAILY  . fluticasone (FLONASE) 50 MCG/ACT nasal spray USE 2 SPRAYS IN EACH NOSTRIL DAILY  . furosemide (LASIX) 20 MG tablet TAKE 1 BY MOUTH DAILY  . Garlic 1000 MG CAPS Take 1 capsule by mouth daily.    . metFORMIN (GLUCOPHAGE) 500 MG tablet Take 1 tablet (500 mg total) by mouth 2 (two) times daily with a meal.  . Multiple Vitamin (MULTIVITAMIN PO) Take 1 tablet by mouth daily.    . naproxen (NAPROSYN) 500 MG tablet TAKE 1 BY MOUTH TWICE DAILY WITH A MEAL  . Omega-3 Fatty Acids (FISH OIL) 1200 MG CAPS Take 2,400 mg by mouth 2 (two) times daily.   Marland Kitchen omeprazole (PRILOSEC) 20 MG capsule TAKE 1 BY MOUTH DAILY  . Saw Palmetto 450 MG CAPS Take 900 mg by mouth daily.   . sildenafil (REVATIO) 20 MG tablet Take 20 mg by mouth 3 (three) times daily.   No current facility-administered medications on file prior to visit.    ROS  Constitutional: Afebrile, no chills, well hydrated and well nourished Gastrointestinal: negative Musculoskeletal:negative Neurological: negative Behavioral/Psych: negative  PFSH  Medical:  Mr. Antonini  has a past medical history of Chronic pain; Depression; Sleep apnea; Kidney stones; Prostatitis; Anxiety; Pacemaker; Migraine; and History of permanent cardiac pacemaker placement (05/31/2015). Family: family history includes Arthritis in his father and mother; Cancer in his father and mother; Diabetes in his mother; Heart block in his father; Heart disease in his father and mother. Surgical:  has past surgical history that includes Tonsillectomy; Carpal tunnel release (2006); Ulnar nerve repair (2008); Cervical spine surgery (2008); heart block (2008); Rotator cuff repair; and Pacemaker insertion. Tobacco:  reports that he has never smoked. He does not have any smokeless tobacco history on file. Alcohol:  reports that he does not  drink alcohol. Drug:  reports that he does not use illicit drugs.  Physical Exam  Vitals:  Today's Vitals   09/01/15 1328  BP: 123/68  Pulse: 71  Temp: 98.1 F (36.7 C)  TempSrc: Oral  Resp: 16  Height: 5\' 8"  (1.727 m)  Weight: 227 lb (102.967 kg)  SpO2: 100%  PainSc: 6     Calculated BMI: Body mass index is 34.52 kg/(m^2).  General appearance: alert, cooperative, appears stated age and no distress Eyes: PERLA Respiratory: No evidence respiratory distress, no audible rales or ronchi and no use of accessory muscles of respiration  Cervical Spine Inspection: Normal anatomy, except for the surgical scar on the right anterior portion of the neck. Alignment: Symetrical ROM: Decreased Palpation: Tender  Upper Extremities Inspection: No gross anomalies detected ROM: Adequate Sensory: Normal Motor: Unremarkable  Thoracic Spine Inspection: No gross anomalies detected Alignment: Symetrical ROM: Adequate Palpation: WNL  Lumbar Spine Inspection: No gross anomalies detected Alignment: Symetrical ROM: Adequate Palpation: WNL Provocative Tests: Lumbar Hyperextension and rotation test: deferred Patrick's Maneuver: deferred Gait: WNL  Lower Extremities Inspection: No gross anomalies detected ROM: Decreased in the knee area Sensory: Normal Motor: Unremarkable  Toe walk (S1):  WNL  Heal walk (L5): WNL  Assessment & Plan  Primary Diagnosis & Pertinent Problem List: The primary encounter diagnosis was Chronic pain. Diagnoses of Chronic neck pain, Long term current use of opiate analgesic, Encounter for therapeutic drug level monitoring, Radicular pain of shoulder (Bilateral) (L>R), Failed cervical surgery syndrome (Right ACDF), Cervicogenic headache, Cervical spondylosis with radiculopathy, Chronic knee pain (Bilateral) (R>L), and Cervical facet syndrome (Bilateral) (L>R) were also pertinent to this visit.  Visit Diagnosis: 1. Chronic pain   2. Chronic neck pain   3.  Long term current use of opiate analgesic   4. Encounter for therapeutic drug level monitoring   5. Radicular pain of shoulder (Bilateral) (L>R)   6. Failed cervical surgery syndrome (Right ACDF)   7. Cervicogenic headache   8. Cervical spondylosis with radiculopathy   9. Chronic knee pain (Bilateral) (R>L)   10. Cervical facet syndrome (Bilateral) (L>R)     Assessment: Chronic knee pain (Bilateral) (R>L) According to the patient this was initially treated at the [facility] by Dr. Ruthann Cancer.   Most recent CT or MRIs: EXAM: CT LUMBAR SPINE WITHOUT CONTRAST (On: 12/18/2013 16:36 ) FINDINGS: Mild facet hypertrophy is present at L1-2 and L2-3 without significant stenosis. L3-4: Mild leftward disc bulging is present. There is no significant stenosis. L4-5: A broad-based disc herniation is present. Moderate facet hypertrophy and ligamentum flavum thickening is evident. Mild left lateral recess narrowing is present. The foramina are patent. L5-S1: A shallow central disc protrusion is present. There is no significant stenosis. Degenerative changes are noted in the SI joints bilaterally.  IMPRESSION: 1. A broad-based disc herniation is asymmetric to the left at L4-5. 2. Mild left lateral recess narrowing at L4-5. 3. Multilevel facet degenerative changes without significant stenosis elsewhere. 4. Degenerative changes are noted in the SI joints bilaterally.  EXAM: CT CERVICAL SPINE WITHOUT CONTRAST (On: 12/18/2013 16:16 ) FINDINGS: Cervical vertebral bodies and posterior elements are intact and aligned with straightened cervical lordosis. C5-6 solid interbody fusion. Radiopaque marker immediately anterior to the C5 and C6 vertebral bodies. Mild-to-moderate C2-3 degenerative disc disease, mild to moderate C6-7 with vacuum disc and endplate spurring. C1-2 articulation maintained with moderate arthropathy. No destructive bony lesions. Mild vascular calcifications. Mildly heterogeneous  thyroid gland. Level by level evaluation: C2-3: Small central disc osteophyte complex, moderate right and mild left facet arthropathy. No canal stenosis. Mild left neural foraminal narrowing. C3-4: Small central broad-based disc osteophyte, mild facet arthropathy without canal stenosis or neural foraminal narrowing. C4-5: Small central disc osteophyte complex, uncovertebral hypertrophy and mild facet arthropathy without canal stenosis or neural foraminal narrowing. C5-6: Interbody fusion. Mild facet arthropathy without canal stenosis. No osseous neural foraminal narrowing. C6-7: Small to moderate broad-based disc osteophyte complex asymmetric to the left. Mild facet arthropathy. No canal stenosis. Severe left neural foraminal narrowing. C7-T1: No disc bulge, canal stenosis nor neural foraminal narrowing.  IMPRESSION: Status post C5-6 solid interbody fusion. Straightened cervical lordosis without acute fracture nor malalignment. C6-7 adjacent segment disease resulting in severe left C6-7 neural foraminal narrowing. No canal stenosis.   Plan of Care  Pharmacotherapy (Medications Ordered): Meds ordered this encounter  Medications  . oxyCODONE (OXY IR/ROXICODONE) 5 MG immediate release tablet    Sig: Take 1 tablet (5 mg total) by mouth every 6 (six) hours.    Dispense:  120 tablet    Refill:  0    Do not place this medication, or any other prescription from our practice, on "Automatic Refill". Patient may have  prescription filled one day early if pharmacy is closed on scheduled refill date. Do not fill until: 09/05/15 To last until: 10/05/15  . oxyCODONE (OXY IR/ROXICODONE) 5 MG immediate release tablet    Sig: Take 1 tablet (5 mg total) by mouth every 6 (six) hours as needed for moderate pain or severe pain.    Dispense:  120 tablet    Refill:  0    Do not place this medication, or any other prescription from our practice, on "Automatic Refill". Patient may have prescription filled one day  early if pharmacy is closed on scheduled refill date. Do not fill until: 10/05/15 To last until: 11/01/15  . oxyCODONE (OXY IR/ROXICODONE) 5 MG immediate release tablet    Sig: Take 1 tablet (5 mg total) by mouth every 6 (six) hours as needed for moderate pain or severe pain.    Dispense:  120 tablet    Refill:  0    Do not place this medication, or any other prescription from our practice, on "Automatic Refill". Patient may have prescription filled one day early if pharmacy is closed on scheduled refill date. Do not fill until: 11/01/15 To last until: 12/01/15  . oxyCODONE (OXYCONTIN) 10 mg 12 hr tablet    Sig: Take 1 tablet (10 mg total) by mouth every 12 (twelve) hours.    Dispense:  60 tablet    Refill:  0    Do not place this medication, or any other prescription from our practice, on "Automatic Refill". Patient may have prescription filled one day early if pharmacy is closed on scheduled refill date. Do not fill until: 09/05/15 To last until: 10/05/15  . oxyCODONE (OXYCONTIN) 10 mg 12 hr tablet    Sig: Take 1 tablet (10 mg total) by mouth every 12 (twelve) hours.    Dispense:  60 tablet    Refill:  0    Do not place this medication, or any other prescription from our practice, on "Automatic Refill". Patient may have prescription filled one day early if pharmacy is closed on scheduled refill date. Do not fill until: 10/05/15 To last until: 11/01/15  . oxyCODONE (OXYCONTIN) 10 mg 12 hr tablet    Sig: Take 1 tablet (10 mg total) by mouth every 12 (twelve) hours.    Dispense:  60 tablet    Refill:  0    Do not place this medication, or any other prescription from our practice, on "Automatic Refill". Patient may have prescription filled one day early if pharmacy is closed on scheduled refill date. Do not fill until: 11/01/15 To last until: 12/01/15    [facility] & Procedure Ordered: Orders Placed This Encounter  Procedures  . CERVICAL EPIDURAL STEROID INJECTION    Standing Status:  Standing     Number of Occurrences: 1     Standing Expiration Date: 08/31/2016    Scheduling Instructions:     Side: Left-sided     Sedation: With Sedation.     Timeframe: PRN Procedure. Patient will call to schedule.     Indication: Neck pain with or without upper extremity pain, numbness, or weakness. Cervical Spondylosis with or without cervical radicular pain.    Order Specific Question:  Where will this procedure be performed?    Answer:  [facility]  . CERVICAL FACET (MEDIAL BRANCH NERVE BLOCK)     Standing Status: Standing     Number of Occurrences: 1     Standing Expiration Date: 08/31/2016    Scheduling Instructions:  Side: Bilateral     Level: C3, C4, C5, C6, & C7 Medial Branch Nerve     Sedation: With Sedation.     Timeframe: PRN Procedure. Patient will call to schedule.     Indications: Neck pain with our without shoulder pain. Cervical Facet Syndrome.    Order Specific Question:  Where will this procedure be performed?    Answer:  [facility]  . KNEE INJECTION    Standing Status: Standing     Number of Occurrences: 1     Standing Expiration Date: 08/31/2016    Scheduling Instructions:     Side: Bilateral     Sedation: No Sedation.     Timeframe: PRN Procedure. Patient will call.    Order Specific Question:  Where will this procedure be performed?    Answer:  [facility]  . Drugs of abuse screen w/o alc, rtn urine-sln    Volume: 10 ml(s). Minimum 3 ml of urine is needed. Document temperature of fresh sample. Indications: Long term (current) use of opiate analgesic (Z79.891) Test#: [id number] (ToxAssure Select-13)  . Comprehensive metabolic panel    Order Specific Question:  Has the patient fasted?    Answer:  No  . C-reactive protein  . Magnesium  . Sedimentation rate  . Vitamin B12    Indication: Bone Pain (M89.9)  . Vitamin D pnl(25-hydrxy+1,25-dihy)-bld    Imaging Ordered: None  Interventional Therapies: Scheduled: None at this  time. PRN Procedures:  1. Left cervical epidural steroid injection under fluoroscopic guidance and IV sedation for this cervical radicular pain (shoulder pain) versus bilateral cervical facet block under fluoroscopic guidance and IV sedation for the neck pain and cervicogenic headaches. 2. A series of Synvisc or Hyalgan knee injections for the knee pain. No fluoroscopy or sedation needed.    Referral(s) or Consult(s): None required at this time since he continues to see the orthopedic surgeons at the [facility].  Medications administered during this visit: Mr. Meditz had no medications administered during this visit.  Future Appointments Date Time Provider Department Center  11/08/2015 1:30 PM Shelia Media, MD [facility] None  11/29/2015 2:00 PM [name], MD [facility] None    Primary Care Physician: Wynona Dove, MD Location: [facility] Note by: Sydnee Levans. Laban Emperor, M.D, DABA, DABAPM, DABPM, DABIPP, FIPP

## 2015-09-01 NOTE — Progress Notes (Signed)
Patient here for medication refill.  oxycontin 10 mg qty 17, last fill 08/11/2015  Oxycodone 5 mg qty 39, last fill 08/11/2015

## 2015-09-01 NOTE — Assessment & Plan Note (Signed)
According to the patient this was initially treated at the [facility] by Dr. Ruthann Cancer.

## 2015-09-02 LAB — C-REACTIVE PROTEIN: CRP: 1.7 mg/dL — ABNORMAL HIGH (ref <1.0)

## 2015-09-09 LAB — TOXASSURE SELECT 13 (MW), URINE: PDF: 0

## 2015-09-14 ENCOUNTER — Telehealth: Payer: Self-pay

## 2015-09-14 NOTE — Telephone Encounter (Signed)
Pt states the he stopped taking the Metformin 5 days ago. Pt c/o the Metformin making his stomach hurt, and terrible acid reflux. Pt states that the blood sugars he has been doing BID are still high but getting better. The last three and a half days it hasn't gone over 149. Please advise thanks

## 2015-09-14 NOTE — Telephone Encounter (Signed)
Notified pt of Dr. Tilman Neat comments, pt verbalized understanding. Pt has appt 11/08/15, pt plans on bringing in log of blood sugars.

## 2015-09-14 NOTE — Telephone Encounter (Signed)
OK. Continue to monitor blood sugars. Set goal of limiting carbohydrate intake to 35gm with each meal and 15gm with a snack. Total carbohydrates should be less than 100gm per day. This will help control blood sugars.

## 2015-09-16 ENCOUNTER — Telehealth: Payer: Self-pay

## 2015-09-16 NOTE — Telephone Encounter (Signed)
OxyContin is not being covered by [employer] patient wants Dr. Metta Clines to write a letter to insurance company so he can continue to get meds.

## 2015-09-17 ENCOUNTER — Telehealth: Payer: Self-pay | Admitting: *Deleted

## 2015-09-17 NOTE — Telephone Encounter (Signed)
Called Mark [name] to find out which local pharmacy he uses so we can begin PA process.

## 2015-09-18 NOTE — Progress Notes (Signed)
Quick Note:  Lab results reviewed and found to be within normal limits. ______ 

## 2015-09-18 NOTE — Progress Notes (Signed)
Quick Note:   Normal fasting (NPO x 8 hours) glucose levels are between 65-99 mg/dl, with 2 hour fasting, levels are usually less than 140 mg/dl. Any random blood glucose level greater than 200 mg/dl is considered to be Diabetes.  BUN levels between 7 to 20 mg/dL (2.5 to 7.1 mmol/L) are considered normal. Elevated blood urea nitrogen can also be due to: urinary tract obstruction; congestive heart failure or recent heart attack; gastrointestinal bleeding; dehydration; shock; severe burns; certain medications, such as corticosteroids and some antibiotics; and/or a high protein diet.  ______

## 2015-09-18 NOTE — Progress Notes (Signed)

## 2015-09-20 ENCOUNTER — Other Ambulatory Visit: Payer: Self-pay | Admitting: Pain Medicine

## 2015-09-20 DIAGNOSIS — N200 Calculus of kidney: Secondary | ICD-10-CM | POA: Diagnosis not present

## 2015-09-20 DIAGNOSIS — N401 Enlarged prostate with lower urinary tract symptoms: Secondary | ICD-10-CM | POA: Diagnosis not present

## 2015-09-20 DIAGNOSIS — N2889 Other specified disorders of kidney and ureter: Secondary | ICD-10-CM | POA: Diagnosis not present

## 2015-09-20 DIAGNOSIS — I878 Other specified disorders of veins: Secondary | ICD-10-CM | POA: Diagnosis not present

## 2015-09-20 NOTE — Telephone Encounter (Signed)
Not my patient

## 2015-09-27 DIAGNOSIS — K4090 Unilateral inguinal hernia, without obstruction or gangrene, not specified as recurrent: Secondary | ICD-10-CM | POA: Diagnosis not present

## 2015-09-27 DIAGNOSIS — N132 Hydronephrosis with renal and ureteral calculous obstruction: Secondary | ICD-10-CM | POA: Diagnosis not present

## 2015-09-28 ENCOUNTER — Telehealth: Payer: Self-pay

## 2015-09-28 NOTE — Telephone Encounter (Signed)
Pt wants Dr. Laban Emperor to write a letter to insurance company saying that no other pain medicines work for him. Pt says this is the same thing that happened last year. Pt has a week and half left to get this information in to [employer] Service number [phone] . oxycontin 10 mg

## 2015-10-06 ENCOUNTER — Telehealth: Payer: Self-pay | Admitting: Pain Medicine

## 2015-10-06 NOTE — Telephone Encounter (Addendum)
Patient will be out of meds on Sunday, he called about prior auth on 09-28-15 Pharmacy will expedite when they are contacted, patient says he has called 4 times on this problem and wants called back today, he will call every day until he gets his meds Med is Oxycodone Ins. Numb to call is [phone] Ins. Says they faxed ins forms

## 2015-10-07 DIAGNOSIS — N201 Calculus of ureter: Secondary | ICD-10-CM | POA: Diagnosis not present

## 2015-10-07 DIAGNOSIS — N200 Calculus of kidney: Secondary | ICD-10-CM | POA: Diagnosis not present

## 2015-10-07 DIAGNOSIS — N132 Hydronephrosis with renal and ureteral calculous obstruction: Secondary | ICD-10-CM | POA: Diagnosis not present

## 2015-10-11 NOTE — Telephone Encounter (Signed)
Spoke with patient and with pharmacy.  [facility] pharmacy states that they do not need a PA at this time, but would know more tomorrow.  Pharmacist states she will fax the form to us if needed.  Mr Mark [name] notified of current situation.

## 2015-10-11 NOTE — Telephone Encounter (Signed)
Attempted to call patient.  Left message to call us.

## 2015-10-15 DIAGNOSIS — F329 Major depressive disorder, single episode, unspecified: Secondary | ICD-10-CM | POA: Diagnosis not present

## 2015-10-15 DIAGNOSIS — Z01818 Encounter for other preprocedural examination: Secondary | ICD-10-CM | POA: Diagnosis not present

## 2015-10-15 DIAGNOSIS — E785 Hyperlipidemia, unspecified: Secondary | ICD-10-CM | POA: Diagnosis not present

## 2015-10-15 DIAGNOSIS — K219 Gastro-esophageal reflux disease without esophagitis: Secondary | ICD-10-CM | POA: Diagnosis not present

## 2015-10-15 DIAGNOSIS — R52 Pain, unspecified: Secondary | ICD-10-CM | POA: Diagnosis not present

## 2015-10-22 ENCOUNTER — Other Ambulatory Visit: Payer: Self-pay

## 2015-10-22 MED ORDER — NAPROXEN 500 MG PO TABS: ORAL_TABLET | ORAL | Status: DC

## 2015-10-22 NOTE — Telephone Encounter (Signed)
Pharmacy is requesting a refill on naproxen (NAPROSYN) 500 MG tablet. Last OV was 08/27/15. Please advise, thanks

## 2015-11-06 HISTORY — PX: LITHOTRIPSY: SUR834

## 2015-11-08 ENCOUNTER — Encounter: Payer: Self-pay | Admitting: Internal Medicine

## 2015-11-08 ENCOUNTER — Ambulatory Visit (INDEPENDENT_AMBULATORY_CARE_PROVIDER_SITE_OTHER): Payer: Medicare Other | Admitting: Internal Medicine

## 2015-11-08 VITALS — BP 111/67 | HR 69 | Temp 98.8°F | Ht 68.0 in | Wt 226.0 lb

## 2015-11-08 DIAGNOSIS — F329 Major depressive disorder, single episode, unspecified: Secondary | ICD-10-CM

## 2015-11-08 DIAGNOSIS — N200 Calculus of kidney: Secondary | ICD-10-CM | POA: Diagnosis not present

## 2015-11-08 DIAGNOSIS — R7309 Other abnormal glucose: Secondary | ICD-10-CM | POA: Diagnosis not present

## 2015-11-08 DIAGNOSIS — G8929 Other chronic pain: Secondary | ICD-10-CM

## 2015-11-08 DIAGNOSIS — M542 Cervicalgia: Secondary | ICD-10-CM | POA: Diagnosis not present

## 2015-11-08 DIAGNOSIS — F32A Depression, unspecified: Secondary | ICD-10-CM

## 2015-11-08 DIAGNOSIS — R739 Hyperglycemia, unspecified: Secondary | ICD-10-CM

## 2015-11-08 NOTE — Patient Instructions (Addendum)
Continue to monitor blood sugars.  Recheck A1c in 3 months.

## 2015-11-08 NOTE — Progress Notes (Signed)
Pre visit review using our clinic review tool, if applicable. No additional management support is needed unless otherwise documented below in the visit note. 

## 2015-11-08 NOTE — Assessment & Plan Note (Signed)
Continues to follow up with Pain Management.

## 2015-11-08 NOTE — Assessment & Plan Note (Signed)
Reviewed recent CT and plain xray of the abdomen. He will continue to follow up with Urology.

## 2015-11-08 NOTE — Progress Notes (Signed)
Subjective:    Patient ID: Mark [name], male    DOB: 06/30/1956, 60 y.o.   MRN: [id number]  HPI  60YO male presents for follow up.  Recently started on Metformin for elevated blood sugars. Lab Results  Component Value Date   HGBA1C 5.3 08/27/2015    BG ranging from low 100s to 140s. Cutting out snacking. Few BG near 160-170.  Seen by Urology. Has 1cm left kidney stone. Planning for intervention with Dr. Lonna Cobb. Not currently having any pain symptoms from this.  No recent changes to pain medications. Followed by Dr. Marchia Bond.  Symptoms of anxiety and depression have been stable on current medication.  Wt Readings from Last 3 Encounters:  11/08/15 226 lb (102.513 kg)  09/01/15 227 lb (102.967 kg)  08/27/15 227 lb 8 oz (103.193 kg)   BP Readings from Last 3 Encounters:  11/08/15 111/67  09/01/15 123/68  08/27/15 124/86    Past Medical History  Diagnosis Date  . Chronic pain   . Depression   . Sleep apnea   . Kidney stones   . Prostatitis   . Anxiety   . Pacemaker   . Migraine   . History of permanent cardiac pacemaker placement 05/31/2015   Family History  Problem Relation Age of Onset  . Heart disease Mother   . Diabetes Mother   . Arthritis Mother   . Cancer Mother     Breast Cancer  . Heart block Father   . Heart disease Father   . Arthritis Father   . Cancer Father     skin cancer   Past Surgical History  Procedure Laterality Date  . Tonsillectomy    . Carpal tunnel release  2006  . Ulnar nerve repair  2008    Left  . Cervical spine surgery  2008  . Heart block  2008    s/p pacemaker, followed by Dr. Lady Gary  . Rotator cuff repair    . Pacemaker insertion     Social History   Social History  . Marital Status: Married    Spouse Name: N/A  . Number of Children: N/A  . Years of Education: N/A   Social History Main Topics  . Smoking status: Never Smoker   . Smokeless tobacco: None  . Alcohol Use: No  . Drug Use: No  . Sexual Activity:  Not Asked   Other Topics Concern  . None   Social History Narrative   Lives with wife.    Review of Systems  Constitutional: Negative for fever, chills, activity change, appetite change, fatigue and unexpected weight change.  Eyes: Negative for visual disturbance.  Respiratory: Negative for cough and shortness of breath.   Cardiovascular: Negative for chest pain, palpitations and leg swelling.  Gastrointestinal: Negative for nausea, vomiting, abdominal pain, diarrhea, constipation and abdominal distention.  Genitourinary: Negative for dysuria, urgency, frequency, flank pain and difficulty urinating.  Musculoskeletal: Positive for myalgias, back pain, arthralgias and neck pain. Negative for gait problem.  Skin: Negative for color change and rash.  Neurological: Negative for weakness.  Hematological: Negative for adenopathy.  Psychiatric/Behavioral: Negative for sleep disturbance and dysphoric mood. The patient is not nervous/anxious.        Objective:    BP 111/67 mmHg  Pulse 69  Temp(Src) 98.8 F (37.1 C) (Oral)  Ht  (1.727 m)  Wt 226 lb (102.513 kg)  BMI 34.37 kg/m2  SpO2 95% Physical Exam  Constitutional: He is oriented to person, place, and time. He  appears well-developed and well-nourished. No distress.  HENT:  Head: Normocephalic and atraumatic.  Right Ear: External ear normal.  Left Ear: External ear normal.  Nose: Nose normal.  Mouth/Throat: Oropharynx is clear and moist. No oropharyngeal exudate.  Eyes: Conjunctivae and EOM are normal. Pupils are equal, round, and reactive to light. Right eye exhibits no discharge. Left eye exhibits no discharge. No scleral icterus.  Neck: Normal range of motion. Neck supple. No tracheal deviation present. No thyromegaly present.  Cardiovascular: Normal rate, regular rhythm and normal heart sounds.  Exam reveals no gallop and no friction rub.   No murmur heard. Pulmonary/Chest: Effort normal and breath sounds normal. No  accessory muscle usage. No tachypnea. No respiratory distress. He has no decreased breath sounds. He has no wheezes. He has no rhonchi. He has no rales. He exhibits no tenderness.  Musculoskeletal: Normal range of motion. He exhibits no edema.  Lymphadenopathy:    He has no cervical adenopathy.  Neurological: He is alert and oriented to person, place, and time. No cranial nerve deficit. Coordination normal.  Skin: Skin is warm and dry. No rash noted. He is not diaphoretic. No erythema. No pallor.  Psychiatric: He has a normal mood and affect. His behavior is normal. Judgment and thought content normal.          Assessment & Plan:   Problem List Items Addressed This Visit      Unprioritized   Chronic neck pain (Location of Primary Source of Pain) (Bilateral) (L>R) (Chronic)    Continues to follow up with Pain Management.      Depression    Symptoms stable >5 years on Fluoxetine and prn Clonazepam. Will continue.      Elevated blood sugar - Primary    BG elevated. Encouraged him to limit intake of processed sugars.       Relevant Orders   Comprehensive metabolic panel   Hemoglobin A1c   Nephrolithiasis    Reviewed recent CT and plain xray of the abdomen. He will continue to follow up with Urology.          Return in about 6 months (around 05/09/2016) for Recheck of Diabetes.  Ronna PolioJennifer Jermine Bibbee, MD Internal Medicine [facility]

## 2015-11-08 NOTE — Assessment & Plan Note (Signed)
BG elevated. Encouraged him to limit intake of processed sugars.

## 2015-11-08 NOTE — Assessment & Plan Note (Signed)
Symptoms stable >5 years on Fluoxetine and prn Clonazepam. Will continue.

## 2015-11-17 DIAGNOSIS — Z7951 Long term (current) use of inhaled steroids: Secondary | ICD-10-CM | POA: Diagnosis not present

## 2015-11-17 DIAGNOSIS — N200 Calculus of kidney: Secondary | ICD-10-CM | POA: Diagnosis not present

## 2015-11-17 DIAGNOSIS — Z79899 Other long term (current) drug therapy: Secondary | ICD-10-CM | POA: Diagnosis not present

## 2015-11-17 DIAGNOSIS — Z888 Allergy status to other drugs, medicaments and biological substances status: Secondary | ICD-10-CM | POA: Diagnosis not present

## 2015-11-17 DIAGNOSIS — Z950 Presence of cardiac pacemaker: Secondary | ICD-10-CM | POA: Diagnosis not present

## 2015-11-17 DIAGNOSIS — Z880 Allergy status to penicillin: Secondary | ICD-10-CM | POA: Diagnosis not present

## 2015-11-17 DIAGNOSIS — N132 Hydronephrosis with renal and ureteral calculous obstruction: Secondary | ICD-10-CM | POA: Diagnosis not present

## 2015-11-17 DIAGNOSIS — Z79891 Long term (current) use of opiate analgesic: Secondary | ICD-10-CM | POA: Diagnosis not present

## 2015-11-17 DIAGNOSIS — N1330 Unspecified hydronephrosis: Secondary | ICD-10-CM | POA: Diagnosis not present

## 2015-11-17 DIAGNOSIS — Z881 Allergy status to other antibiotic agents status: Secondary | ICD-10-CM | POA: Diagnosis not present

## 2015-11-17 DIAGNOSIS — Z7982 Long term (current) use of aspirin: Secondary | ICD-10-CM | POA: Diagnosis not present

## 2015-11-17 DIAGNOSIS — N201 Calculus of ureter: Secondary | ICD-10-CM | POA: Diagnosis not present

## 2015-11-17 DIAGNOSIS — N202 Calculus of kidney with calculus of ureter: Secondary | ICD-10-CM | POA: Diagnosis not present

## 2015-11-17 DIAGNOSIS — K219 Gastro-esophageal reflux disease without esophagitis: Secondary | ICD-10-CM | POA: Diagnosis not present

## 2015-11-17 DIAGNOSIS — Z87442 Personal history of urinary calculi: Secondary | ICD-10-CM

## 2015-11-17 HISTORY — DX: Personal history of urinary calculi: Z87.442

## 2015-11-29 ENCOUNTER — Ambulatory Visit: Payer: Medicare Other | Attending: Pain Medicine | Admitting: Pain Medicine

## 2015-11-29 ENCOUNTER — Encounter: Payer: Self-pay | Admitting: Pain Medicine

## 2015-11-29 VITALS — BP 128/68 | HR 81 | Temp 97.9°F | Resp 16 | Ht 68.0 in | Wt 224.0 lb

## 2015-11-29 DIAGNOSIS — Z79891 Long term (current) use of opiate analgesic: Secondary | ICD-10-CM | POA: Diagnosis not present

## 2015-11-29 DIAGNOSIS — M25512 Pain in left shoulder: Secondary | ICD-10-CM | POA: Insufficient documentation

## 2015-11-29 DIAGNOSIS — N132 Hydronephrosis with renal and ureteral calculous obstruction: Secondary | ICD-10-CM | POA: Diagnosis not present

## 2015-11-29 DIAGNOSIS — M25511 Pain in right shoulder: Secondary | ICD-10-CM | POA: Diagnosis not present

## 2015-11-29 DIAGNOSIS — R51 Headache: Secondary | ICD-10-CM | POA: Insufficient documentation

## 2015-11-29 DIAGNOSIS — E785 Hyperlipidemia, unspecified: Secondary | ICD-10-CM | POA: Insufficient documentation

## 2015-11-29 DIAGNOSIS — M47892 Other spondylosis, cervical region: Secondary | ICD-10-CM | POA: Diagnosis not present

## 2015-11-29 DIAGNOSIS — Z6839 Body mass index (BMI) 39.0-39.9, adult: Secondary | ICD-10-CM | POA: Diagnosis not present

## 2015-11-29 DIAGNOSIS — M4722 Other spondylosis with radiculopathy, cervical region: Secondary | ICD-10-CM

## 2015-11-29 DIAGNOSIS — R739 Hyperglycemia, unspecified: Secondary | ICD-10-CM | POA: Diagnosis not present

## 2015-11-29 DIAGNOSIS — G8929 Other chronic pain: Secondary | ICD-10-CM | POA: Insufficient documentation

## 2015-11-29 DIAGNOSIS — M542 Cervicalgia: Secondary | ICD-10-CM | POA: Insufficient documentation

## 2015-11-29 DIAGNOSIS — B3789 Other sites of candidiasis: Secondary | ICD-10-CM | POA: Diagnosis not present

## 2015-11-29 DIAGNOSIS — N401 Enlarged prostate with lower urinary tract symptoms: Secondary | ICD-10-CM | POA: Diagnosis not present

## 2015-11-29 DIAGNOSIS — Z950 Presence of cardiac pacemaker: Secondary | ICD-10-CM | POA: Insufficient documentation

## 2015-11-29 DIAGNOSIS — I10 Essential (primary) hypertension: Secondary | ICD-10-CM | POA: Diagnosis not present

## 2015-11-29 DIAGNOSIS — D696 Thrombocytopenia, unspecified: Secondary | ICD-10-CM | POA: Diagnosis not present

## 2015-11-29 DIAGNOSIS — T402X5A Adverse effect of other opioids, initial encounter: Secondary | ICD-10-CM

## 2015-11-29 DIAGNOSIS — R001 Bradycardia, unspecified: Secondary | ICD-10-CM | POA: Diagnosis not present

## 2015-11-29 DIAGNOSIS — Z5181 Encounter for therapeutic drug level monitoring: Secondary | ICD-10-CM | POA: Diagnosis not present

## 2015-11-29 DIAGNOSIS — E669 Obesity, unspecified: Secondary | ICD-10-CM | POA: Insufficient documentation

## 2015-11-29 DIAGNOSIS — Z981 Arthrodesis status: Secondary | ICD-10-CM | POA: Diagnosis not present

## 2015-11-29 DIAGNOSIS — F329 Major depressive disorder, single episode, unspecified: Secondary | ICD-10-CM | POA: Insufficient documentation

## 2015-11-29 DIAGNOSIS — L308 Other specified dermatitis: Secondary | ICD-10-CM | POA: Diagnosis not present

## 2015-11-29 DIAGNOSIS — M25519 Pain in unspecified shoulder: Secondary | ICD-10-CM | POA: Diagnosis present

## 2015-11-29 DIAGNOSIS — N433 Hydrocele, unspecified: Secondary | ICD-10-CM | POA: Insufficient documentation

## 2015-11-29 DIAGNOSIS — K5903 Drug induced constipation: Secondary | ICD-10-CM | POA: Diagnosis not present

## 2015-11-29 DIAGNOSIS — F419 Anxiety disorder, unspecified: Secondary | ICD-10-CM | POA: Diagnosis not present

## 2015-11-29 DIAGNOSIS — G4700 Insomnia, unspecified: Secondary | ICD-10-CM | POA: Diagnosis not present

## 2015-11-29 DIAGNOSIS — N138 Other obstructive and reflux uropathy: Secondary | ICD-10-CM | POA: Insufficient documentation

## 2015-11-29 DIAGNOSIS — R011 Cardiac murmur, unspecified: Secondary | ICD-10-CM | POA: Insufficient documentation

## 2015-11-29 DIAGNOSIS — F1190 Opioid use, unspecified, uncomplicated: Secondary | ICD-10-CM

## 2015-11-29 MED ORDER — OXYCODONE HCL 5 MG PO TABS: 5.0000 mg | ORAL_TABLET | Freq: Four times a day (QID) | ORAL | Status: DC | PRN

## 2015-11-29 MED ORDER — LUBIPROSTONE 24 MCG PO CAPS: 24.0000 ug | ORAL_CAPSULE | Freq: Two times a day (BID) | ORAL | Status: DC

## 2015-11-29 MED ORDER — OXYCODONE HCL ER 10 MG PO T12A
10.0000 mg | EXTENDED_RELEASE_TABLET | Freq: Two times a day (BID) | ORAL | Status: DC
Start: 2015-11-29 — End: 2016-02-02

## 2015-11-29 MED ORDER — BENEFIBER PO POWD: ORAL | Status: DC

## 2015-11-29 MED ORDER — OXYCODONE HCL ER 10 MG PO T12A: 10.0000 mg | EXTENDED_RELEASE_TABLET | Freq: Two times a day (BID) | ORAL | Status: DC

## 2015-11-29 MED ORDER — NALOXONE HCL 4 MG/0.1ML NA LIQD
1.0000 | Freq: Once | NASAL | Status: DC
Start: 2015-11-29 — End: 2015-12-02

## 2015-11-29 MED ORDER — OXYCODONE HCL 5 MG PO TABS: 5.0000 mg | ORAL_TABLET | Freq: Four times a day (QID) | ORAL | Status: DC

## 2015-11-29 NOTE — Patient Instructions (Signed)
Naloxone injection What is this medicine? NALOXONE (nal OX one) is a narcotic blocker. It is used to treat narcotic drug overdose. This medicine may be used for other purposes; ask your health care provider or pharmacist if you have questions. What should I tell my health care provider before I take this medicine? They need to know if you have any of these conditions: -drug abuse or addiction -heart disease -an unusual or allergic reaction to naloxone, other medicines, foods, dyes, or preservatives -pregnant or trying to get pregnantbreast-feeding How should I use this medicine? This medicine is for injection into the outer thigh. It can be injected through clothing if needed. Get emergency medical help right away after giving the first dose of this medicine, even if the person wakes up. You should be familiar with how to recognize the signs and symptoms of a narcotic overdose. Administer according to the printed instructions on the device label or the electronic voice instructions. You should practice using the Trainer injector before this medicine is needed. Talk to your pediatrician regarding the use of this medicine in children. While this drug may be prescribed for children as [place] as newborn for selected conditions, precautions do apply. For infants less than 1 year of age, pinch the thigh muscle while administering. Overdosage: If you think you have taken too much of this medicine contact a poison control center or emergency room at once. NOTE: This medicine is only for you. Do not share this medicine with others. What if I miss a dose? This does not apply. What may interact with this medicine? This medicine is only used during an emergency. No interactions are expected during emergency use. This list may not describe all possible interactions. Give your health care provider a list of all the medicines, herbs, non-prescription drugs, or dietary supplements you use. Also tell them if you  smoke, drink alcohol, or use illegal drugs. Some items may interact with your medicine. What should I watch for while using this medicine? Keep this medicine ready for use in the case of a narcotic overdose. Make sure that you have the phone number of your doctor or health care professional and local hospital ready. You may need to have additional doses of this medicine. Each injector contains a single dose. Some emergencies may require additional doses. After use, bring the treated person to the nearest hospital or call 911. Make sure the treating health care professional knows that the person has received an injection of this medicine. You will receive additional instructions on what to do during and after use of this medicine before an emergency occurs. What side effects may I notice from receiving this medicine? Side effects that you should report to your doctor or health care professional as soon as possible: -allergic reactions like skin rash, itching or hives, swelling of the face, lips, or tongue -breathing problems -fast, irregular heartbeat -high blood pressure -pain that was controlled by narcotic pain medicine -seizures Side effects that usually do not require medical attention (report to your doctor or health care professional if they continue or are bothersome): -anxious -chills -diarrhea -fever -nausea, vomiting -sweating This list may not describe all possible side effects. Call your doctor for medical advice about side effects. You may report side effects to FDA at 1-800-FDA-1088. Where should I keep my medicine? Keep out of the reach of children. Store at room temperature between 15 and 25 degrees C (59 and 77 degrees F). Keep this medicine in its outer case until   ready to use. Occasionally check the solution through the viewing window of the injector. The solution should be clear. If it is discolored, cloudy, or contains solid particles, replace it with a new injector.  Remember to check the expiration date of this medicine regularly. Throw away any unused medicine after the expiration date. NOTE: This sheet is a summary. It may not cover all possible information. If you have questions about this medicine, talk to your doctor, pharmacist, or health care provider.    2016, [employer]. (2014-06-30 11:04:51) Pain Management Discharge Instructions  General Discharge Instructions :  If you need to reach your doctor call: Monday-Friday 8:00 am - 4:00 pm at [phone] or toll free [phone].  After clinic hours [phone] to have operator reach doctor.  Bring all of your medication bottles to all your appointments in the pain clinic.  To cancel or reschedule your appointment with Pain Management please remember to call 24 hours in advance to avoid a fee.  Refer to the educational materials which you have been given on: General Risks, I had my Procedure. Discharge Instructions, Post Sedation.  Post Procedure Instructions:  The drugs you were given will stay in your system until tomorrow, so for the next 24 hours you should not drive, make any legal decisions or drink any alcoholic beverages.  You may eat anything you prefer, but it is better to start with liquids then soups and crackers, and gradually work up to solid foods.  Please notify your doctor immediately if you have any unusual bleeding, trouble breathing or pain that is not related to your normal pain.  Depending on the type of procedure that was done, some parts of your body may feel week and/or numb.  This usually clears up by tonight or the next day.  Walk with the use of an assistive device or accompanied by an adult for the 24 hours.  You may use ice on the affected area for the first 24 hours.  Put ice in a Ziploc bag and cover with a towel and place against area 15 minutes on 15 minutes off.  You may switch to heat after 24 hours.Naloxone nasal spray What is this  medicine? NALOXONE (nal OX one) is a narcotic blocker. It is used to treat narcotic drug overdose. This medicine may be used for other purposes; ask your health care provider or pharmacist if you have questions. What should I tell my health care provider before I take this medicine? They need to know if you have any of these conditions: -drug abuse or addiction -heart disease -an unusual or allergic reaction to naloxone, other medicines, foods, dyes, or preservatives -pregnant or trying to get pregnant -breast-feeding How should I use this medicine? This medicine is for use in the nose. Lay the person on their back. Support their neck with your hand and allow the head to tilt back before giving the medicine. The nasal spray should be given into 1 nostril. After giving the medicine, move the person onto their side. Do not remove or test the nasal spray until ready to use. Get emergency medical help right away after giving the first dose of this medicine, even if the person wakes up. You should be familiar with how to recognize the signs and symptoms of a narcotic overdose. If more doses are needed, give the additional dose in the other nostril. Talk to your pediatrician regarding the use of this medicine in children. While this drug may be prescribed for children as  young as newborns for selected conditions, precautions do apply. Overdosage: If you think you have taken too much of this medicine contact a poison control center or emergency room at once. NOTE: This medicine is only for you. Do not share this medicine with others. What if I miss a dose? This does not apply. What may interact with this medicine? This medicine is only used during an emergency. No interactions are expected during emergency use. This list may not describe all possible interactions. Give your health care provider a list of all the medicines, herbs, non-prescription drugs, or dietary supplements you use. Also tell them if  you smoke, drink alcohol, or use illegal drugs. Some items may interact with your medicine. What should I watch for while using this medicine? Keep this medicine ready for use in the case of a narcotic overdose. Make sure that you have the phone number of your doctor or health care professional and local hospital ready. You may need to have additional doses of this medicine. Each nasal spray contains a single dose. Some emergencies may require additional doses. After use, bring the treated person to the nearest hospital or call 911. Make sure the treating health care professional knows that the person has received an injection of this medicine. You will receive additional instructions on what to do during and after use of this medicine before an emergency occurs. What side effects may I notice from receiving this medicine? Side effects that you should report to your doctor or health care professional as soon as possible: -allergic reactions like skin rash, itching or hives, swelling of the face, lips, or tongue -breathing problems -fast, irregular heartbeat -high blood pressure -pain that was controlled by narcotic pain medicine -seizures Side effects that usually do not require medical attention (report these to your doctor or health care professional if they continue or are bothersome): -anxious -chills -diarrhea -fever -headache -muscle pain -nausea, vomiting -nose irritation like dryness, congestion, and swelling -sweating This list may not describe all possible side effects. Call your doctor for medical advice about side effects. You may report side effects to FDA at 1-800-FDA-1088. Where should I keep my medicine? Keep out of the reach of children. Store between 4 and 40 degrees C (39 and 104 degrees F). Do not freeze. Throw away any unused medicine after the expiration date. Keep in original box until ready to use. NOTE: This sheet is a summary. It may not cover all possible  information. If you have questions about this medicine, talk to your doctor, pharmacist, or health care provider.    2016, [employer]. (2014-07-10 14:24:03) Pain Management Discharge Instructions  General Discharge Instructions :  If you need to reach your doctor call: Monday-Friday 8:00 am - 4:00 pm at [phone] or toll free [phone].  After clinic hours [phone] to have operator reach doctor.  Bring all of your medication bottles to all your appointments in the pain clinic.  To cancel or reschedule your appointment with Pain Management please remember to call 24 hours in advance to avoid a fee.  Refer to the educational materials which you have been given on: General Risks, I had my Procedure. Discharge Instructions, Post Sedation.  Post Procedure Instructions:  The drugs you were given will stay in your system until tomorrow, so for the next 24 hours you should not drive, make any legal decisions or drink any alcoholic beverages.  You may eat anything you prefer, but it is better to start with liquids then soups  and crackers, and gradually work up to solid foods.  Please notify your doctor immediately if you have any unusual bleeding, trouble breathing or pain that is not related to your normal pain.  Depending on the type of procedure that was done, some parts of your body may feel week and/or numb.  This usually clears up by tonight or the next day.  Walk with the use of an assistive device or accompanied by an adult for the 24 hours.  You may use ice on the affected area for the first 24 hours.  Put ice in a Ziploc bag and cover with a towel and place against area 15 minutes on 15 minutes off.  You may switch to heat after 24 hours.GENERAL RISKS AND COMPLICATIONS  What are the risk, side effects and possible complications? Generally speaking, most procedures are safe.  However, with any procedure there are risks, side effects, and the possibility of  complications.  The risks and complications are dependent upon the sites that are lesioned, or the type of nerve block to be performed.  The closer the procedure is to the spine, the more serious the risks are.  Great care is taken when placing the radio frequency needles, block needles or lesioning probes, but sometimes complications can occur. 1. Infection: Any time there is an injection through the skin, there is a risk of infection.  This is why sterile conditions are used for these blocks.  There are four possible types of infection. 1. Localized skin infection. 2. Central Nervous System Infection-This can be in the form of Meningitis, which can be deadly. 3. Epidural Infections-This can be in the form of an epidural abscess, which can cause pressure inside of the spine, causing compression of the spinal cord with subsequent paralysis. This would require an emergency surgery to decompress, and there are no guarantees that the patient would recover from the paralysis. 4. Discitis-This is an infection of the intervertebral discs.  It occurs in about 1% of discography procedures.  It is difficult to treat and it may lead to surgery.        2. Pain: the needles have to go through skin and soft tissues, will cause soreness.       3. Damage to internal structures:  The nerves to be lesioned may be near blood vessels or    other nerves which can be potentially damaged.       4. Bleeding: Bleeding is more common if the patient is taking blood thinners such as  aspirin, Coumadin, Ticiid, Plavix, etc., or if he/she have some genetic predisposition  such as hemophilia. Bleeding into the spinal canal can cause compression of the spinal  cord with subsequent paralysis.  This would require an emergency surgery to  decompress and there are no guarantees that the patient would recover from the  paralysis.       5. Pneumothorax:  Puncturing of a lung is a possibility, every time a needle is introduced in  the area of  the chest or upper back.  Pneumothorax refers to free air around the  collapsed lung(s), inside of the thoracic cavity (chest cavity).  Another two possible  complications related to a similar event would include: Hemothorax and Chylothorax.   These are variations of the Pneumothorax, where instead of air around the collapsed  lung(s), you may have blood or chyle, respectively.       6. Spinal headaches: They may occur with any procedures in the area of the  spine.       7. Persistent CSF (Cerebro-Spinal Fluid) leakage: This is a rare problem, but may occur  with prolonged intrathecal or epidural catheters either due to the formation of a fistulous  track or a dural tear.       8. Nerve damage: By working so close to the spinal cord, there is always a possibility of  nerve damage, which could be as serious as a permanent spinal cord injury with  paralysis.       9. Death:  Although rare, severe deadly allergic reactions known as "Anaphylactic  reaction" can occur to any of the medications used.      10. Worsening of the symptoms:  We can always make thing worse.  What are the chances of something like this happening? Chances of any of this occuring are extremely low.  By statistics, you have more of a chance of getting killed in a motor vehicle accident: while driving to the hospital than any of the above occurring .  Nevertheless, you should be aware that they are possibilities.  In general, it is similar to taking a shower.  Everybody knows that you can slip, hit your head and get killed.  Does that mean that you should not shower again?  Nevertheless always keep in mind that statistics do not mean anything if you happen to be on the wrong side of them.  Even if a procedure has a 1 (one) in a 1,000,000 (million) chance of going wrong, it you happen to be that one..Also, keep in mind that by statistics, you have more of a chance of having something go wrong when taking medications.  Who should not have  this procedure? If you are on a blood thinning medication (e.g. Coumadin, Plavix, see list of "Blood Thinners"), or if you have an active infection going on, you should not have the procedure.  If you are taking any blood thinners, please inform your physician.  How should I prepare for this procedure?  Do not eat or drink anything at least six hours prior to the procedure.  Bring a driver with you .  It cannot be a taxi.  Come accompanied by an adult that can drive you back, and that is strong enough to help you if your legs get weak or numb from the local anesthetic.  Take all of your medicines the morning of the procedure with just enough water to swallow them.  If you have diabetes, make sure that you are scheduled to have your procedure done first thing in the morning, whenever possible.  If you have diabetes, take only half of your insulin dose and notify our nurse that you have done so as soon as you arrive at the clinic.  If you are diabetic, but only take blood sugar pills (oral hypoglycemic), then do not take them on the morning of your procedure.  You may take them after you have had the procedure.  Do not take aspirin or any aspirin-containing medications, at least eleven (11) days prior to the procedure.  They may prolong bleeding.  Wear loose fitting clothing that may be easy to take off and that you would not mind if it got stained with Betadine or blood.  Do not wear any jewelry or perfume  Remove any nail coloring.  It will interfere with some of our monitoring equipment.  NOTE: Remember that this is not meant to be interpreted as a complete list of all possible complications.  Unforeseen  problems may occur.  BLOOD THINNERS The following drugs contain aspirin or other products, which can cause increased bleeding during surgery and should not be taken for 2 weeks prior to and 1 week after surgery.  If you should need take something for relief of minor pain, you may take  acetaminophen which is found in Tylenol,m Datril, Anacin-3 and Panadol. It is not blood thinner. The products listed below are.  Do not take any of the products listed below in addition to any listed on your instruction sheet.  A.P.C or A.P.C with Codeine Codeine Phosphate Capsules #3 Ibuprofen Ridaura  ABC compound Congesprin Imuran rimadil  Advil Cope Indocin Robaxisal  Alka-Seltzer Effervescent Pain Reliever and Antacid Coricidin or Coricidin-D  Indomethacin Rufen  Alka-Seltzer plus Cold Medicine Cosprin Ketoprofen S-A-C Tablets  Anacin Analgesic Tablets or Capsules Coumadin Korlgesic Salflex  Anacin Extra Strength Analgesic tablets or capsules CP-2 Tablets Lanoril Salicylate  Anaprox Cuprimine Capsules Levenox Salocol  Anexsia-D Dalteparin Magan Salsalate  Anodynos Darvon compound Magnesium Salicylate Sine-off  Ansaid Dasin Capsules Magsal Sodium Salicylate  Anturane Depen Capsules Marnal Soma  APF Arthritis pain formula Dewitt's Pills Measurin Stanback  Argesic Dia-Gesic Meclofenamic Sulfinpyrazone  Arthritis Bayer Timed Release Aspirin Diclofenac Meclomen Sulindac  Arthritis pain formula Anacin Dicumarol Medipren Supac  Analgesic (Safety coated) Arthralgen Diffunasal Mefanamic Suprofen  Arthritis Strength Bufferin Dihydrocodeine Mepro Compound Suprol  Arthropan liquid Dopirydamole Methcarbomol with Aspirin Synalgos  ASA tablets/Enseals Disalcid Micrainin Tagament  Ascriptin Doan's Midol Talwin  Ascriptin A/D Dolene Mobidin Tanderil  Ascriptin Extra Strength Dolobid Moblgesic Ticlid  Ascriptin with Codeine Doloprin or Doloprin with Codeine Momentum Tolectin  Asperbuf Duoprin Mono-gesic Trendar  Aspergum Duradyne Motrin or Motrin IB Triminicin  Aspirin plain, buffered or enteric coated Durasal Myochrisine Trigesic  Aspirin Suppositories Easprin Nalfon Trillsate  Aspirin with Codeine Ecotrin Regular or Extra Strength Naprosyn Uracel  Atromid-S Efficin Naproxen Ursinus  Auranofin  Capsules Elmiron Neocylate Vanquish  Axotal Emagrin Norgesic Verin  Azathioprine Empirin or Empirin with Codeine Normiflo Vitamin E  Azolid Emprazil Nuprin Voltaren  Bayer Aspirin plain, buffered or children's or timed BC Tablets or powders Encaprin Orgaran Warfarin Sodium  Buff-a-Comp Enoxaparin Orudis Zorpin  Buff-a-Comp with Codeine Equegesic Os-Cal-Gesic   Buffaprin Excedrin plain, buffered or Extra Strength Oxalid   Bufferin Arthritis Strength Feldene Oxphenbutazone   Bufferin plain or Extra Strength Feldene Capsules Oxycodone with Aspirin   Bufferin with Codeine Fenoprofen Fenoprofen Pabalate or Pabalate-SF   Buffets II Flogesic Panagesic   Buffinol plain or Extra Strength Florinal or Florinal with Codeine Panwarfarin   Buf-Tabs Flurbiprofen Penicillamine   Butalbital Compound Four-way cold tablets Penicillin   Butazolidin Fragmin Pepto-Bismol   Carbenicillin Geminisyn Percodan   Carna Arthritis Reliever Geopen Persantine   Carprofen Gold's salt Persistin   Chloramphenicol Goody's Phenylbutazone   Chloromycetin Haltrain Piroxlcam   Clmetidine heparin Plaquenil   Cllnoril Hyco-pap Ponstel   Clofibrate Hydroxy chloroquine Propoxyphen         Before stopping any of these medications, be sure to consult the physician who ordered them.  Some, such as Coumadin (Warfarin) are ordered to prevent or treat serious conditions such as "deep thrombosis", "pumonary embolisms", and other heart problems.  The amount of time that you may need off of the medication may also vary with the medication and the reason for which you were taking it.  If you are taking any of these medications, please make sure you notify your pain physician before you undergo any procedures.

## 2015-11-29 NOTE — Progress Notes (Signed)
Patient's Name: Mark [name]  Patient type: Established  MRN: [id number]  Service setting: Ambulatory outpatient  DOB: 10-20-1955  Location: [facility]  DOS: 11/29/2015  Primary Care Physician: Mark Dove, MD  Note by: Mark [name], M.D, DABA, DABAPM, DABPM, DABIPP, FIPP  Referring Physician: Shelia Media, MD  Specialty: Board-Certified Interventional Pain Management     Primary Reason(s) for Visit: Encounter for prescription drug management (Level of risk: moderate) CC: Neck Pain and Shoulder Pain   HPI  Mark [name] is a 60 y.o. year old, male patient, who returns today as an established patient. He has Chronic pain; Depression; Insomnia; Anxiety; Medicare annual wellness visit, subsequent; Hyperlipidemia; Thrombocytopenia (HCC); Obesity (BMI 30-39.9); Elevated BP; Routine general medical examination at a health care facility; Hydrocele in adult; Candidal dermatitis; Benign prostatic hyperplasia with urinary obstruction; BP (high blood pressure); Nephrolithiasis; Bradycardia; Cardiac murmur; Chronic pain syndrome; Opiate use (60 MME/Day); Opiate dependence (HCC); Long term prescription opiate use; Long term current use of opiate analgesic; Encounter for therapeutic drug level monitoring; Chronic neck pain (Location of Primary Source of Pain) (Bilateral) (L>R); Chronic shoulder pain (Bilateral) (L>R); Chronic knee pain (Bilateral) (R>L); Chronic arm pain; Obesity; History of permanent cardiac pacemaker placement; Opioid-induced constipation (OIC); Elevated blood sugar; Radicular pain of shoulder (Bilateral) (L>R); 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); Cervicogenic headache; Cervical spondylosis with radiculopathy (Bilateral) (L>R); Cervical facet syndrome (Bilateral) (L>R); Hydronephrosis; and Calculi, ureter on his problem list.. His primarily concern today is the Neck Pain and Shoulder Pain   Pain Assessment: Self-Reported  Pain Score: 2  Reported level is compatible with observation Pain Type: Chronic pain Pain Location: Neck Pain Orientation: Left, Right Pain Descriptors / Indicators: Constant, Dull, Sharp (thr more activity the more pain ) Pain Frequency: Constant  Had an episode of kidney stones. He was given some pain medication for this but he brought the prescription to the clinic today. We have disposed of it in front of witnesses.  Date of Last Visit: 09/01/15 Service Provided on Last Visit: Med Refill  Controlled Substance Pharmacotherapy Assessment & REMS (Risk Evaluation and Mitigation Strategy)  Analgesic: Oxycodone ER 10 mg every 12 hours (20 mg/day) + oxycodone IR 5 mg every 6 hours (20 mg/day) Pill Count: Pill count is oxycodone 5mg  is 31/120 filled on 11/11/2015 and oxycodone 10mg  29/60 filled on 11/11/2015 and oxcodone /apap 5-325 mg is 25/30 tablets filled on 11/17/2015 by Mark [name] [facility]) per patient from having kidney stones last week patien went to [facility], [place]. On November 29, 2015 wasted in toilet 25 tablets of oxycodone/apap 5-325 mg tablets,verified by Mark Jones, RN and the patient. MME/day: 60 mg/day.  Pharmacokinetics: Onset of action (Liberation/Absorption): Within expected pharmacological parameters Time to Peak effect (Distribution): Timing and results are as within normal expected parameters Duration of action (Metabolism/Excretion): Within normal limits for medication Pharmacodynamics: Analgesic Effect: More than 50% Activity Facilitation: Medication(s) allow patient to sit, stand, walk, and do the basic ADLs Perceived Effectiveness: Described as relatively effective, allowing for increase in activities of daily living (ADL) Side-effects or Adverse reactions: None reported Monitoring: Mark [name]: Online review of the past 70-month period conducted. Compliant with practice rules and regulations UDS Results/interpretation: The patient's last UDS was done on  09/01/2015 and it came back within normal limits with no unexpected results. Medication Assessment Form: Reviewed. Patient indicates being compliant with therapy Treatment compliance: Compliant Risk Assessment: Aberrant Behavior: None observed today Substance Use Disorder (SUD) Risk Level:  Low Risk of opioid abuse or dependence: 0.7-3.0% with doses ? 36 MME/day and 6.1-26% with doses ? 120 MME/day. Opioid Risk Tool (ORT) Score: Total Score: 1 Low Risk for SUD (Score <3) Depression Scale Score: PHQ-2: PHQ-2 Total Score: 0 No depression (0) PHQ-9: PHQ-9 Total Score: 0 No depression (0-4)  Pharmacologic Plan: No change in therapy, at this time  Laboratory Chemistry  Inflammation Markers Lab Results  Component Value Date   ESRSEDRATE 6 09/01/2015   CRP 1.7* 09/01/2015    Renal Function Lab Results  Component Value Date   BUN 21* 09/01/2015   CREATININE 1.18 09/01/2015   GFRAA >60 09/01/2015   GFRNONAA >60 09/01/2015    Hepatic Function Lab Results  Component Value Date   AST 38 09/01/2015   ALT 33 09/01/2015   ALBUMIN 4.0 09/01/2015    Electrolytes Lab Results  Component Value Date   NA 138 09/01/2015   K 3.8 09/01/2015   CL 102 09/01/2015   CALCIUM 9.4 09/01/2015   MG 2.1 09/01/2015    Pain Modulating Vitamins No results found for: VD25OH, VD125OH2TOT, EA5409WJ1, BJ4782NF6, VITAMINB12  Coagulation Parameters Lab Results  Component Value Date   INR 1.0 06/03/2007   LABPROT 13.7 06/03/2007    Note: I personally reviewed the above data. Results shared with patient.  Meds  The patient has a current medication list which includes the following prescription(s): aspirin, atorvastatin, b complex vitamins, cetirizine, vitamin d3, clindamycin, clonazepam, desonide, fluoxetine, fluoxetine, fluticasone, furosemide, garlic, multiple vitamins-minerals, naloxone hcl, naproxen, fish oil, omeprazole, oxycodone, oxycodone, oxycodone, oxycodone, oxycodone, oxycodone,  oxycodone-acetaminophen, saw palmetto, sildenafil, tamsulosin, lubiprostone, and benefiber.  Current Outpatient Prescriptions on File Prior to Visit  Medication Sig  . aspirin 81 MG tablet Take 81 mg by mouth daily.    Marland Kitchen atorvastatin (LIPITOR) 20 MG tablet Take 20 mg by mouth daily.  Marland Kitchen b complex vitamins tablet Take 1 tablet by mouth daily.    . cetirizine (ZYRTEC) 10 MG tablet Take 10 mg by mouth daily. As needed  . Cholecalciferol (VITAMIN D3) 1000 UNITS CAPS Take 2 Units by mouth daily.   . clindamycin (CLEOCIN) 150 MG capsule Take 150 mg by mouth daily. 4 caps 1 hour before dental procedures.   . clonazePAM (KLONOPIN) 1 MG tablet TAKE 1 BY MOUTH 3 TIMES DAILY AS NEEDED FOR ANXIETY  . desonide (DESOWEN) 0.05 % ointment Apply topically 2 (two) times daily. (Patient taking differently: Apply topically as needed. )  . FLUoxetine (PROZAC) 20 MG capsule TAKE 1 BY MOUTH DAILY  . FLUoxetine (PROZAC) 40 MG capsule TAKE 1 BY MOUTH DAILY  . fluticasone (FLONASE) 50 MCG/ACT nasal spray USE 2 SPRAYS IN EACH NOSTRIL DAILY  . furosemide (LASIX) 20 MG tablet TAKE 1 BY MOUTH DAILY  . Garlic 1000 MG CAPS Take 1 capsule by mouth daily.    . Multiple Vitamin (MULTIVITAMIN PO) Take 1 tablet by mouth daily.    . naproxen (NAPROSYN) 500 MG tablet TAKE 1 BY MOUTH TWICE DAILY WITH A MEAL  . Omega-3 Fatty Acids (FISH OIL) 1200 MG CAPS Take 2,400 mg by mouth 2 (two) times daily.   Marland Kitchen omeprazole (PRILOSEC) 20 MG capsule TAKE 1 BY MOUTH DAILY  . Saw Palmetto 450 MG CAPS Take 900 mg by mouth daily.   . sildenafil (REVATIO) 20 MG tablet Take 20 mg by mouth 3 (three) times daily.   No current facility-administered medications on file prior to visit.    ROS  Constitutional: Afebrile, no chills, well  hydrated and well nourished Gastrointestinal: No upper or lower GI bleeding, no nausea, no vomiting and no acute GI distress Musculoskeletal: No acute joint swelling or redness, no acute loss of range of motion and no  acute onset weakness Neurological: Denies any acute onset apraxia, no episodes of paralysis, no acute loss of coordination, no acute loss of consciousness and no acute onset aphasia, dysarthria, agnosia, or amnesia  Allergies  Mr. Blumstein is allergic to ciprofloxacin; metformin; morphine and related; penicillins; and buprenorphine hcl.  PFSH  Medical:  Mr. Mccauley  has a past medical history of Chronic pain; Depression; Sleep apnea; Kidney stones; Prostatitis; Anxiety; Pacemaker; Migraine; History of permanent cardiac pacemaker placement (05/31/2015); and History of kidney stones (April 12,2017). Family: family history includes Arthritis in his father and mother; Cancer in his father and mother; Diabetes in his mother; Heart block in his father; Heart disease in his father and mother. Surgical:  has past surgical history that includes Tonsillectomy; Carpal tunnel release (2006); Ulnar nerve repair (2008); Cervical spine surgery (2008); heart block (2008); Rotator cuff repair; and Pacemaker insertion. Tobacco:  reports that he has never smoked. He does not have any smokeless tobacco history on file. Alcohol:  reports that he does not drink alcohol. Drug:  reports that he does not use illicit drugs.  Physical Examination  Constitutional Vitals: Blood pressure 128/68, pulse 81, temperature 97.9 F (36.6 C), temperature source Oral, resp. rate 16, height 5\' 8"  (1.727 m), weight 224 lb (101.606 kg), SpO2 100 %. Calculated BMI: Body mass index is 34.07 kg/(m^2). (30-34.9 kg/m2) Obese (Class I) - 68% higher incidence of chronic pain General appearance: alert, cooperative, oriented, in no distress, mildly obese, well nourished and well hydrated Eyes: PERLA Respiratory: No evidence respiratory distress, no audible rales or ronchi and no use of accessory muscles of respiration Psych: Alert, oriented to person, oriented to place and oriented to time  Cervical Spine Exam  Inspection: Normal anatomy, no  anomalies observed Cervical Lordosis: Normal Alignment: Symetrical Functional ROM: Within functional limits (WFL) AROM: WFL Sensory: No sensory anomalies reported or detected  Upper Extremity Exam    Right  Left  Inspection: No gross anomalies detected  Inspection: No gross anomalies detected  Functional ROM: Adequate  Functional ROM: Adequate  AROM: Adequate  AROM: Adequate  Sensory: No sensory anomalies reported or detected  Sensory: No sensory anomalies reported or detected  Motor: Unremarkable  Motor: Unremarkable  Vascular: Normal skin color, temperature, and hair growth. No peripheral edema or cyanosis  Vascular: Normal skin color, temperature, and hair growth. No peripheral edema or cyanosis   Thoracic Spine  Inspection: No gross anomalies detected Alignment: Symetrical Functional ROM: Within functional limits [facility]) AROM: Adequate Palpation: WNL  Lumbar Spine  Inspection: No gross anomalies detected Alignment: Symetrical Functional ROM: Within functional limits (WFL) AROM: Adequate Sensory: No sensory anomalies reported or detected Palpation: WNL Provocative Tests: Lumbar Hyperextension and rotation test: deferred Patrick's Maneuver: deferred  Gait Assessment  Gait: WNL  Lower Extremities    Right  Left  Inspection: No gross anomalies detected  Inspection: No gross anomalies detected  Functional ROM: Within functional limits [facility])  Functional ROM: Within functional limits (WFL)  AROM: Adequate  AROM: Adequate  Sensory: No sensory anomalies reported or detected  Sensory: No sensory anomalies reported or detected  Motor: Unremarkable  Motor: Unremarkable  Toe walk (S1): WNL  Toe walk (S1): WNL  Heal walk (L5): WNL  Heal walk (L5): WNL   Assessment & Plan  Primary Diagnosis & Pertinent Problem List: The primary encounter diagnosis was Chronic pain. Diagnoses of Encounter for therapeutic drug level monitoring, Long term current use of opiate analgesic, Chronic neck  pain (Location of Primary Source of Pain) (Bilateral) (L>R), Cervical spondylosis with radiculopathy (Bilateral) (L>R), Thrombocytopenia (HCC), Opiate use (60 MME/Day), and Opioid-induced constipation (OIC) were also pertinent to this visit.  Visit Diagnosis: 1. Chronic pain   2. Encounter for therapeutic drug level monitoring   3. Long term current use of opiate analgesic   4. Chronic neck pain (Location of Primary Source of Pain) (Bilateral) (L>R)   5. Cervical spondylosis with radiculopathy (Bilateral) (L>R)   6. Thrombocytopenia (HCC)   7. Opiate use (60 MME/Day)   8. Opioid-induced constipation (OIC)     Problems updated and reviewed during this visit: No problems updated.  Problem-specific Plan(s): No problem-specific assessment & plan notes found for this encounter.  No new assessment & plan notes have been filed under this hospital service since the last note was generated. Service: Pain Management   Plan of Care   Problem List Items Addressed This Visit      High   Cervical spondylosis with radiculopathy (Bilateral) (L>R) (Chronic)   Relevant Medications   oxyCODONE (OXY IR/ROXICODONE) 5 MG immediate release tablet   oxyCODONE (OXY IR/ROXICODONE) 5 MG immediate release tablet   oxyCODONE (OXY IR/ROXICODONE) 5 MG immediate release tablet   oxyCODONE (OXYCONTIN) 10 mg 12 hr tablet   oxyCODONE (OXYCONTIN) 10 mg 12 hr tablet   oxyCODONE (OXYCONTIN) 10 mg 12 hr tablet   oxyCODONE-acetaminophen (PERCOCET/ROXICET) 5-325 MG tablet   Chronic neck pain (Location of Primary Source of Pain) (Bilateral) (L>R) (Chronic)   Relevant Medications   oxyCODONE (OXY IR/ROXICODONE) 5 MG immediate release tablet   oxyCODONE (OXY IR/ROXICODONE) 5 MG immediate release tablet   oxyCODONE (OXY IR/ROXICODONE) 5 MG immediate release tablet   oxyCODONE (OXYCONTIN) 10 mg 12 hr tablet   oxyCODONE (OXYCONTIN) 10 mg 12 hr tablet   oxyCODONE (OXYCONTIN) 10 mg 12 hr tablet   oxyCODONE-acetaminophen  (PERCOCET/ROXICET) 5-325 MG tablet   Chronic pain - Primary (Chronic)   Relevant Medications   oxyCODONE (OXY IR/ROXICODONE) 5 MG immediate release tablet   oxyCODONE (OXY IR/ROXICODONE) 5 MG immediate release tablet   oxyCODONE (OXY IR/ROXICODONE) 5 MG immediate release tablet   oxyCODONE (OXYCONTIN) 10 mg 12 hr tablet   oxyCODONE (OXYCONTIN) 10 mg 12 hr tablet   oxyCODONE (OXYCONTIN) 10 mg 12 hr tablet   oxyCODONE-acetaminophen (PERCOCET/ROXICET) 5-325 MG tablet     Medium   Encounter for therapeutic drug level monitoring   Long term current use of opiate analgesic (Chronic)   Relevant Orders   ToxASSURE Select 13 (MW), Urine   Opiate use (60 MME/Day) (Chronic)   Relevant Medications   Naloxone HCl (NARCAN) 4 MG/0.1ML LIQD   Opioid-induced constipation (OIC) (Chronic)   Relevant Medications   Wheat Dextrin (BENEFIBER) POWD   lubiprostone (AMITIZA) 24 MCG capsule     Low   Thrombocytopenia (HCC)       Pharmacotherapy (Medications Ordered): Meds ordered this encounter  Medications  . Naloxone HCl (NARCAN) 4 MG/0.1ML LIQD    Sig: Place 1 Bottle into the nose once. , then call 911, repeat if needed in other nostril with new bottle.    Dispense:  2 each    Refill:  0    Please provide the patient with clear instructions on the use of this device/medication.  Marland Kitchen. oxyCODONE (OXY IR/ROXICODONE) 5 MG  immediate release tablet    Sig: Take 1 tablet (5 mg total) by mouth every 6 (six) hours.    Dispense:  120 tablet    Refill:  0    Do not place this medication, or any other prescription from our practice, on "Automatic Refill". Patient may have prescription filled one day early if pharmacy is closed on scheduled refill date. Do not fill until: 12/01/15 To last until: 12/31/15  . oxyCODONE (OXY IR/ROXICODONE) 5 MG immediate release tablet    Sig: Take 1 tablet (5 mg total) by mouth every 6 (six) hours as needed for moderate pain or severe pain.    Dispense:  120 tablet    Refill:   0    Do not place this medication, or any other prescription from our practice, on "Automatic Refill". Patient may have prescription filled one day early if pharmacy is closed on scheduled refill date. Do not fill until: 12/31/15 To last until: 01/30/16  . oxyCODONE (OXY IR/ROXICODONE) 5 MG immediate release tablet    Sig: Take 1 tablet (5 mg total) by mouth every 6 (six) hours as needed for moderate pain or severe pain.    Dispense:  120 tablet    Refill:  0    Do not place this medication, or any other prescription from our practice, on "Automatic Refill". Patient may have prescription filled one day early if pharmacy is closed on scheduled refill date. Do not fill until: 01/30/16 To last until: 02/29/16  . oxyCODONE (OXYCONTIN) 10 mg 12 hr tablet    Sig: Take 1 tablet (10 mg total) by mouth every 12 (twelve) hours.    Dispense:  60 tablet    Refill:  0    Do not place this medication, or any other prescription from our practice, on "Automatic Refill". Patient may have prescription filled one day early if pharmacy is closed on scheduled refill date. Do not fill until: 12/01/15 To last until: 12/31/15  . oxyCODONE (OXYCONTIN) 10 mg 12 hr tablet    Sig: Take 1 tablet (10 mg total) by mouth every 12 (twelve) hours.    Dispense:  60 tablet    Refill:  0    Do not place this medication, or any other prescription from our practice, on "Automatic Refill". Patient may have prescription filled one day early if pharmacy is closed on scheduled refill date. Do not fill until: 12/31/15 To last until: 01/30/16  . oxyCODONE (OXYCONTIN) 10 mg 12 hr tablet    Sig: Take 1 tablet (10 mg total) by mouth every 12 (twelve) hours.    Dispense:  60 tablet    Refill:  0    Do not place this medication, or any other prescription from our practice, on "Automatic Refill". Patient may have prescription filled one day early if pharmacy is closed on scheduled refill date. Do not fill until: 01/30/16 To last until:  02/29/16  . Wheat Dextrin (BENEFIBER) POWD    Sig: Stir 2 tsp. TID into 4-8 oz of any non-carbonated beverage or soft food (hot or cold)    Dispense:  500 g    Refill:  PRN    Do not place this medication, or any other prescription from our practice, on "Automatic Refill". Patient may have prescription filled one day early if pharmacy is closed on scheduled refill date.  . lubiprostone (AMITIZA) 24 MCG capsule    Sig: Take 1 capsule (24 mcg total) by mouth 2 (two) times daily with a meal.  Swallow the medication whole. Do not break or chew the medication.    Dispense:  60 capsule    Refill:  2    Do not place this medication, or any other prescription from our practice, on "Automatic Refill". Patient may have prescription filled one day early if pharmacy is closed on scheduled refill date.    Lab-work & Procedure Ordered: Orders Placed This Encounter  Procedures  . ToxASSURE Select 13 (MW), Urine    Imaging Ordered: None  Interventional Therapies: Scheduled:  None at this time.    Considering:  None at this time.    PRN Procedures:  None at this time.    Referral(s) or Consult(s): None at this time.  New Prescriptions   LUBIPROSTONE (AMITIZA) 24 MCG CAPSULE    Take 1 capsule (24 mcg total) by mouth 2 (two) times daily with a meal. Swallow the medication whole. Do not break or chew the medication.   NALOXONE HCL (NARCAN) 4 MG/0.1ML LIQD    Place 1 Bottle into the nose once. , then call 911, repeat if needed in other nostril with new bottle.   WHEAT DEXTRIN (BENEFIBER) POWD    Stir 2 tsp. TID into 4-8 oz of any non-carbonated beverage or soft food (hot or cold)    Medications administered during this visit: Mr. Tumminello had no medications administered during this visit.  Future Appointments Date Time Provider Department Center  02/15/2016 2:00 PM [name], MD [facility] None  05/09/2016 1:30 PM Mark Media, MD [facility] None    Primary Care Physician:  Mark Dove, MD Location: [facility] Note by: Mark [name], M.D, DABA, DABAPM, DABPM, DABIPP, FIPP  Pain Score Disclaimer: We use the NRS-11 scale. This is a self-reported, subjective measurement of pain severity with only modest accuracy. It is used primarily to identify changes within a particular patient. It must be understood that outpatient pain scales are significantly less accurate that those used for research, where they can be applied under ideal controlled circumstances with minimal exposure to variables. In reality, the score is likely to be a combination of pain intensity and pain affect, where pain affect describes the degree of emotional arousal or changes in action readiness caused by the sensory experience of pain. Factors such as social and work situation, setting, emotional state, anxiety levels, expectation, and prior pain experience may influence pain perception and show large inter-individual differences that may also be affected by time variables.

## 2015-11-29 NOTE — Progress Notes (Signed)
Safety precautions to be maintained throughout the outpatient stay will include: orient to surroundings, keep bed in low position, maintain call bell within reach at all times, provide assistance with transfer out of bed and ambulation. Pill count is oxycodone 5mg  is 31/120 filled on 11/11/2015 and oxycodone 10mg  29/60 filled on 11/11/2015 and oxcodone /apap 5-325 mg is 25/30 tablets filled on 11/17/2015 [facility] patient from having kidney stones last [facility] [place] [place] On November 29, 2015 wasted in toilet 25 tablets of oxycodone/apap 5-325 mg tablets,verified by Eber Jonesena Wheatley, RN and the patient.

## 2015-12-02 ENCOUNTER — Other Ambulatory Visit: Payer: Self-pay | Admitting: Pain Medicine

## 2015-12-02 DIAGNOSIS — N201 Calculus of ureter: Secondary | ICD-10-CM | POA: Diagnosis not present

## 2015-12-02 DIAGNOSIS — Z79891 Long term (current) use of opiate analgesic: Secondary | ICD-10-CM

## 2015-12-02 DIAGNOSIS — I878 Other specified disorders of veins: Secondary | ICD-10-CM | POA: Diagnosis not present

## 2015-12-02 DIAGNOSIS — F1190 Opioid use, unspecified, uncomplicated: Secondary | ICD-10-CM

## 2015-12-02 DIAGNOSIS — N2889 Other specified disorders of kidney and ureter: Secondary | ICD-10-CM | POA: Diagnosis not present

## 2015-12-02 DIAGNOSIS — N200 Calculus of kidney: Secondary | ICD-10-CM | POA: Diagnosis not present

## 2015-12-02 MED ORDER — NALOXONE HCL 4 MG/0.1ML NA LIQD: NASAL | Status: DC

## 2015-12-04 LAB — TOXASSURE SELECT 13 (MW), URINE: PDF: 0

## 2015-12-29 ENCOUNTER — Other Ambulatory Visit (INDEPENDENT_AMBULATORY_CARE_PROVIDER_SITE_OTHER): Payer: Medicare Other

## 2015-12-29 DIAGNOSIS — R739 Hyperglycemia, unspecified: Secondary | ICD-10-CM

## 2015-12-29 DIAGNOSIS — R7309 Other abnormal glucose: Secondary | ICD-10-CM | POA: Diagnosis not present

## 2015-12-29 LAB — COMPREHENSIVE METABOLIC PANEL
ALT: 23 U/L (ref 0–53)
AST: 24 U/L (ref 0–37)
Albumin: 4.2 g/dL (ref 3.5–5.2)
Alkaline Phosphatase: 58 U/L (ref 39–117)
BUN: 24 mg/dL — ABNORMAL HIGH (ref 6–23)
CO2: 27 mEq/L (ref 19–32)
Calcium: 8.9 mg/dL (ref 8.4–10.5)
Chloride: 104 mEq/L (ref 96–112)
Creatinine, Ser: 1.01 mg/dL (ref 0.40–1.50)
GFR: 80.37 mL/min (ref >60.00)
Glucose, Bld: 95 mg/dL (ref 70–99)
Potassium: 3.8 mEq/L (ref 3.5–5.1)
Sodium: 140 mEq/L (ref 135–145)
Total Bilirubin: 0.6 mg/dL (ref 0.2–1.2)
Total Protein: 6.1 g/dL (ref 6.0–8.3)

## 2015-12-29 LAB — HEMOGLOBIN A1C: Hgb A1c MFr Bld: 5.3 % (ref 4.6–6.5)

## 2015-12-31 ENCOUNTER — Telehealth: Payer: Self-pay | Admitting: *Deleted

## 2015-12-31 NOTE — Telephone Encounter (Signed)
sw pt informed him that Dr. Laban EmperorNaveira changed his template. gave appt for 02/15/16 @ 9:40 AM. pt is aware.Marland Kitchen.Marland Kitchen.TD

## 2016-01-18 DIAGNOSIS — I495 Sick sinus syndrome: Secondary | ICD-10-CM | POA: Diagnosis not present

## 2016-01-24 ENCOUNTER — Other Ambulatory Visit: Payer: Self-pay

## 2016-01-24 MED ORDER — NAPROXEN 500 MG PO TABS: ORAL_TABLET | ORAL | Status: DC

## 2016-01-30 DIAGNOSIS — N200 Calculus of kidney: Secondary | ICD-10-CM | POA: Diagnosis not present

## 2016-02-02 ENCOUNTER — Ambulatory Visit: Payer: Medicare Other | Attending: Pain Medicine | Admitting: Pain Medicine

## 2016-02-02 ENCOUNTER — Other Ambulatory Visit
Admission: RE | Admit: 2016-02-02 | Discharge: 2016-02-02 | Disposition: A | Discharge status: Home / Self Care | Payer: Medicare Other | Source: Ambulatory Visit | Attending: Pain Medicine | Admitting: Pain Medicine

## 2016-02-02 ENCOUNTER — Encounter: Payer: Self-pay | Admitting: Pain Medicine

## 2016-02-02 VITALS — BP 108/67 | HR 77 | Temp 97.6°F | Resp 18 | Ht 68.0 in | Wt 224.0 lb

## 2016-02-02 DIAGNOSIS — E785 Hyperlipidemia, unspecified: Secondary | ICD-10-CM | POA: Diagnosis not present

## 2016-02-02 DIAGNOSIS — M47816 Spondylosis without myelopathy or radiculopathy, lumbar region: Secondary | ICD-10-CM | POA: Insufficient documentation

## 2016-02-02 DIAGNOSIS — Z950 Presence of cardiac pacemaker: Secondary | ICD-10-CM | POA: Insufficient documentation

## 2016-02-02 DIAGNOSIS — G8929 Other chronic pain: Secondary | ICD-10-CM | POA: Diagnosis not present

## 2016-02-02 DIAGNOSIS — M533 Sacrococcygeal disorders, not elsewhere classified: Secondary | ICD-10-CM | POA: Insufficient documentation

## 2016-02-02 DIAGNOSIS — Z79891 Long term (current) use of opiate analgesic: Secondary | ICD-10-CM | POA: Diagnosis not present

## 2016-02-02 DIAGNOSIS — Z981 Arthrodesis status: Secondary | ICD-10-CM | POA: Diagnosis not present

## 2016-02-02 DIAGNOSIS — M170 Bilateral primary osteoarthritis of knee: Secondary | ICD-10-CM | POA: Diagnosis not present

## 2016-02-02 DIAGNOSIS — G4486 Cervicogenic headache: Secondary | ICD-10-CM

## 2016-02-02 DIAGNOSIS — I10 Essential (primary) hypertension: Secondary | ICD-10-CM | POA: Insufficient documentation

## 2016-02-02 DIAGNOSIS — M25562 Pain in left knee: Secondary | ICD-10-CM | POA: Diagnosis not present

## 2016-02-02 DIAGNOSIS — M47812 Spondylosis without myelopathy or radiculopathy, cervical region: Secondary | ICD-10-CM

## 2016-02-02 DIAGNOSIS — B372 Candidiasis of skin and nail: Secondary | ICD-10-CM | POA: Insufficient documentation

## 2016-02-02 DIAGNOSIS — M47892 Other spondylosis, cervical region: Secondary | ICD-10-CM | POA: Insufficient documentation

## 2016-02-02 DIAGNOSIS — M4806 Spinal stenosis, lumbar region: Secondary | ICD-10-CM | POA: Diagnosis not present

## 2016-02-02 DIAGNOSIS — M4802 Spinal stenosis, cervical region: Secondary | ICD-10-CM | POA: Insufficient documentation

## 2016-02-02 DIAGNOSIS — N401 Enlarged prostate with lower urinary tract symptoms: Secondary | ICD-10-CM | POA: Insufficient documentation

## 2016-02-02 DIAGNOSIS — R51 Headache: Secondary | ICD-10-CM

## 2016-02-02 DIAGNOSIS — E669 Obesity, unspecified: Secondary | ICD-10-CM | POA: Diagnosis not present

## 2016-02-02 DIAGNOSIS — F419 Anxiety disorder, unspecified: Secondary | ICD-10-CM | POA: Insufficient documentation

## 2016-02-02 DIAGNOSIS — G4700 Insomnia, unspecified: Secondary | ICD-10-CM | POA: Diagnosis not present

## 2016-02-02 DIAGNOSIS — M545 Low back pain: Secondary | ICD-10-CM

## 2016-02-02 DIAGNOSIS — N433 Hydrocele, unspecified: Secondary | ICD-10-CM | POA: Insufficient documentation

## 2016-02-02 DIAGNOSIS — F329 Major depressive disorder, single episode, unspecified: Secondary | ICD-10-CM | POA: Diagnosis not present

## 2016-02-02 DIAGNOSIS — M5126 Other intervertebral disc displacement, lumbar region: Secondary | ICD-10-CM | POA: Insufficient documentation

## 2016-02-02 DIAGNOSIS — N132 Hydronephrosis with renal and ureteral calculous obstruction: Secondary | ICD-10-CM | POA: Diagnosis not present

## 2016-02-02 DIAGNOSIS — M47896 Other spondylosis, lumbar region: Secondary | ICD-10-CM

## 2016-02-02 DIAGNOSIS — M542 Cervicalgia: Secondary | ICD-10-CM | POA: Diagnosis not present

## 2016-02-02 DIAGNOSIS — Z5181 Encounter for therapeutic drug level monitoring: Secondary | ICD-10-CM

## 2016-02-02 DIAGNOSIS — M25512 Pain in left shoulder: Secondary | ICD-10-CM | POA: Insufficient documentation

## 2016-02-02 DIAGNOSIS — F1190 Opioid use, unspecified, uncomplicated: Secondary | ICD-10-CM | POA: Diagnosis not present

## 2016-02-02 DIAGNOSIS — M5382 Other specified dorsopathies, cervical region: Secondary | ICD-10-CM

## 2016-02-02 DIAGNOSIS — K5903 Drug induced constipation: Secondary | ICD-10-CM | POA: Diagnosis not present

## 2016-02-02 DIAGNOSIS — M50222 Other cervical disc displacement at C5-C6 level: Secondary | ICD-10-CM | POA: Insufficient documentation

## 2016-02-02 DIAGNOSIS — M4722 Other spondylosis with radiculopathy, cervical region: Secondary | ICD-10-CM

## 2016-02-02 DIAGNOSIS — Z6834 Body mass index (BMI) 34.0-34.9, adult: Secondary | ICD-10-CM | POA: Insufficient documentation

## 2016-02-02 DIAGNOSIS — M25561 Pain in right knee: Secondary | ICD-10-CM | POA: Diagnosis not present

## 2016-02-02 DIAGNOSIS — M5127 Other intervertebral disc displacement, lumbosacral region: Secondary | ICD-10-CM | POA: Diagnosis not present

## 2016-02-02 DIAGNOSIS — M19012 Primary osteoarthritis, left shoulder: Secondary | ICD-10-CM | POA: Diagnosis not present

## 2016-02-02 DIAGNOSIS — M47818 Spondylosis without myelopathy or radiculopathy, sacral and sacrococcygeal region: Secondary | ICD-10-CM

## 2016-02-02 DIAGNOSIS — N138 Other obstructive and reflux uropathy: Secondary | ICD-10-CM | POA: Insufficient documentation

## 2016-02-02 DIAGNOSIS — M461 Sacroiliitis, not elsewhere classified: Secondary | ICD-10-CM | POA: Insufficient documentation

## 2016-02-02 DIAGNOSIS — Z6839 Body mass index (BMI) 39.0-39.9, adult: Secondary | ICD-10-CM | POA: Insufficient documentation

## 2016-02-02 DIAGNOSIS — D696 Thrombocytopenia, unspecified: Secondary | ICD-10-CM | POA: Diagnosis not present

## 2016-02-02 DIAGNOSIS — M47817 Spondylosis without myelopathy or radiculopathy, lumbosacral region: Secondary | ICD-10-CM

## 2016-02-02 DIAGNOSIS — M48061 Spinal stenosis, lumbar region without neurogenic claudication: Secondary | ICD-10-CM | POA: Insufficient documentation

## 2016-02-02 LAB — VITAMIN B12: Vitamin B-12: 861 pg/mL (ref 180–914)

## 2016-02-02 MED ORDER — OXYCODONE HCL 5 MG PO TABS: 5.0000 mg | ORAL_TABLET | Freq: Four times a day (QID) | ORAL | Status: DC | PRN

## 2016-02-02 MED ORDER — OXYCODONE HCL ER 10 MG PO T12A: 10.0000 mg | EXTENDED_RELEASE_TABLET | Freq: Two times a day (BID) | ORAL | Status: DC

## 2016-02-02 NOTE — Patient Instructions (Signed)
You were given 3 prescriptions each for Oxycodone 5 mg and Oxycodone 10 mg. Please get you labs done today if possible.

## 2016-02-02 NOTE — Progress Notes (Signed)
Patient's Name: Mark [name]  Patient type: Established  MRN: [id number]  Service setting: Ambulatory outpatient  DOB: 25-Mar-1956  Location: [facility]  DOS: 02/02/2016  Primary Care Physician: Wynona Dove, MD  Note by: Sydnee Levans. Laban Emperor, M.D, DABA, DABAPM, DABPM, DABIPP, FIPP  Referring Physician: Shelia Media, MD  Specialty: Board-Certified Interventional Pain Management  Last Visit to Pain Management: 12/31/2015   Primary Reason(s) for Visit: Encounter for prescription drug management (Level of risk: moderate) CC: Neck Pain   HPI  Mark [name] is a 60 y.o. year old, male patient, who returns today as an established patient. He has Chronic pain; Depression; Insomnia; Anxiety; Medicare annual wellness visit, subsequent; Hyperlipidemia; Thrombocytopenia (HCC); Obesity (BMI 30-39.9); Elevated BP; Routine general medical examination at a health care facility; Hydrocele in adult; Candidal dermatitis; Benign prostatic hyperplasia with urinary obstruction; BP (high blood pressure); Nephrolithiasis; Bradycardia; Cardiac murmur; Chronic pain syndrome; Opiate use (60 MME/Day); Opiate dependence (HCC); Long term prescription opiate use; Long term current use of opiate analgesic; Encounter for therapeutic drug level monitoring; Chronic neck pain (Location of Primary Source of Pain) (Bilateral) (L>R); Chronic shoulder pain (Bilateral) (L>R); Chronic knee pain (Bilateral) (R>L); Chronic arm pain; Obesity; History of permanent cardiac pacemaker placement; Opioid-induced constipation (OIC); Elevated blood sugar; Radicular pain of shoulder (Bilateral) (L>R); 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); Cervicogenic headache; Cervical spondylosis with radiculopathy (Bilateral) (L>R); Cervical facet syndrome (Bilateral) (L>R); Hydronephrosis; Calculi, ureter; Cervical 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 His primarily concern today is the Neck Pain   Pain Assessment: Self-Reported Pain Score: 2  Reported level is compatible with observation       Pain Type: Chronic pain Pain Location: Neck Pain Orientation: Left Pain Descriptors / Indicators: Aching, Stabbing, Shooting, Dull Pain Frequency: Constant  The patient comes into the clinics today for pharmacological management of his chronic pain. I last saw this patient on 11/29/2015. The patient  reports that he does not use illicit drugs. His body mass index is 34.07 kg/(m^2).  Date of Last Visit: 11/29/15 Service Provided on Last Visit: Med Refill  Controlled Substance Pharmacotherapy Assessment & REMS (Risk Evaluation and Mitigation Strategy)  Analgesic: Oxycodone ER 10 mg every 12 hours (20 mg/day) + oxycodone IR 5 mg every 6 hours (20 mg/day) MME/day: 60 mg/day.  Pill Count: Oxycodone 5 mg #44/120 Filled 01-06-16. Oxycontin 10 mg #15/60 Filled 01-06-16. Pharmacokinetics: Onset of action (Liberation/Absorption): Within expected pharmacological parameters Time to Peak effect (Distribution): Timing and results are as within normal expected parameters Duration of action (Metabolism/Excretion): Within normal limits for medication Pharmacodynamics: Analgesic Effect: More than 50% Activity Facilitation: Medication(s) allow patient to sit, stand, walk, and do the basic ADLs Perceived Effectiveness: Described as relatively effective, allowing for increase in activities of daily living (ADL) Side-effects or Adverse reactions: None reported Monitoring: [place] PMP: Online review of the past 64-month period conducted.  Compliant with practice rules and regulations Last UDS on record: TOXASSURE SELECT 13  Date Value Ref Range Status  11/29/2015 FINAL  Final    Comment:    ==================================================================== TOXASSURE SELECT 13 (MW) ==================================================================== Test  Result       Flag       Units Drug Present and Declared for Prescription Verification   7-aminoclonazepam              617          EXPECTED   ng/mg creat    7-aminoclonazepam is an expected metabolite of clonazepam. Source    of clonazepam is a scheduled prescription medication.   Oxycodone                      2513         EXPECTED   ng/mg creat   Noroxycodone                   3308         EXPECTED   ng/mg creat    Sources of oxycodone include scheduled prescription medications.    Noroxycodone is an expected metabolite of oxycodone. ==================================================================== Test                      Result    Flag   Units      Ref Range   Creatinine              52               mg/dL      >=96>=20 ==================================================================== Declared Medications:  The flagging and interpretation on this report are based on the  following declared medications.  Unexpected results may arise from  inaccuracies in the declared medications.  **Note: The testing scope of this panel includes these medications:  Clonazepam  Oxycodone  Oxycodone (Oxycodone Acetaminophen)  **Note: The testing scope of this panel does not include following  reported medications:  Acetaminophen (Oxycodone Acetaminophen)  Aspirin  Atorvastatin  Cetirizine  Cholecalciferol  Clindamycin  Desonide  Fluoxetine  Fluticasone  Furosemide  Multivitamin  Naloxone  Naproxen  Omega-3 Fatty Acids  Omeprazole  Sildenafil  Supplement  Supplement (Saw Palmetto)  Tamsulosin  Vitamin  B ==================================================================== For clinical consultation, please call [phone]. ====================================================================    UDS interpretation: Compliant Patient informed of the CDC guidelines and recommendations to stay away from the concomitant use of benzodiazepines and opioids due to the increased risk of respiratory depression and death. Medication Assessment Form: Reviewed. Patient indicates being compliant with therapy Treatment compliance: Compliant Risk Assessment: Aberrant Behavior: None observed today Substance Use Disorder (SUD) Risk Level: No change since last visit Risk of opioid abuse or dependence: 0.7-3.0% with doses ? 36 MME/day and 6.1-26% with doses ? 120 MME/day. Opioid Risk Tool (ORT) Score: Total Score: 1 Low Risk for SUD (Score <3) Depression Scale Score: PHQ-2: PHQ-2 Total Score: 0 No depression (0) PHQ-9: PHQ-9 Total Score: 0 No depression (0-4)  Pharmacologic Plan: No change in therapy, at this time  Laboratory Chemistry  Inflammation Markers Lab Results  Component Value Date   ESRSEDRATE 6 09/01/2015   CRP 1.7* 09/01/2015    Renal Function Lab Results  Component Value Date   BUN 24* 12/29/2015   CREATININE 1.01 12/29/2015   GFRAA >60 09/01/2015   GFRNONAA >60 09/01/2015    Hepatic Function Lab Results  Component Value Date   AST 24 12/29/2015   ALT 23 12/29/2015   ALBUMIN 4.2 12/29/2015    Electrolytes Lab Results  Component Value Date   NA 140 12/29/2015   K 3.8 12/29/2015   CL 104 12/29/2015   CALCIUM  8.9 12/29/2015   MG 2.1 09/01/2015    Pain Modulating Vitamins Lab Results  Component Value Date   VITAMINB12 861 02/02/2016    Coagulation Parameters Lab Results  Component Value Date   INR 1.0 06/03/2007   LABPROT 13.7 06/03/2007   APTT 29 06/03/2007   PLT 172.0 05/07/2015   Note: Labs Reviewed.  Recent Diagnostic Imaging  Cervical  Imaging: Cervical MR wo contrast:  Results for orders placed in visit on 05/29/06 (Pre-Cervical Surgery) (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_2008)  MR C Spine Ltd W/O Cm   Narrative * PRIOR REPORT IMPORTED FROM AN EXTERNAL SYSTEM *   PRIOR REPORT IMPORTED FROM THE [facility] WORKFLOW SYSTEM   REASON FOR EXAM:     Left arm radiating pain with Previous h/o disc  disease  COMMENTS:   PROCEDURE:     MR  - MR CERVICAL SPINE WO CONT  - May 29 2006  4:38PM   RESULT:   HISTORY:  LEFT arm radiating pain.  Previous history of disc disease.   COMPARISON STUDIES:   Prior MRI of the cervical spine dated 04/27/05.   FINDINGS:   A C5-C6 disc protrusion is present.  The disc protrusion is  central to RIGHT paracentral and is stable from 04/27/05.  Small disc  protrusions are present at C3-C4 and C4-C5.  These are stable as well.    No  new disc protrusions are identified.  The cervical cord is intact.  There  is  flattening of the RIGHT lateral recess at C5-C6 with narrowing of the  RIGHT  neural foramen at this level.  This finding is stable from 04/27/05.  The  craniovertebral junction is normal.   The bony structures of the cervical  and upper thoracic spine are unremarkable.   IMPRESSION:   1.     Stable C3-C4, C4-C5 and C5-C6 disc protrusions as described above.  The disc protrusion at C5-C6 is RIGHT paracentral and results in  flattening  of the RIGHT portion of the thecal sac and neural foramen at this level.  This finding is again stable from 04/27/05.  2.     There are no new findings to suggest new disc protrusions.  There  is  no evidence of high-grade spinal stenosis or again new prominent disc  protrusion.   Thank you for the opportunity to contribute to the care of your patient.       Cervical MR w/wo contrast:  Results for orders placed in visit on 10/19/06  MR Cervical Spine W Wo Contrast   Narrative * PRIOR REPORT IMPORTED FROM AN EXTERNAL SYSTEM *   PRIOR REPORT IMPORTED FROM THE [facility]  WORKFLOW SYSTEM   REASON FOR EXAM:    Neck pain  COMMENTS:   PROCEDURE:     MR  - MR CERVICAL SPINE WO/W  - Oct 19 2006  4:37PM   RESULT:   TECHNIQUE:  Sagittal and axial T1, T2 and sagittal T2 fat/sat images were  obtained.   FINDINGS:  There is noted central protrusion of the disc at C3-4 as seen  on  previous study of 05/29/06. Some mild broad-based imaging is noted at  C4-5.  Postoperative changes are noted at C5-6.  No herniated disc at this level.  There is also noted a central mild protrusion of the disc at C6-7. No  spinal  stenosis is evident.   There are signal changes in the vertebral bodies of C5-6, postoperative  change.   IMPRESSION:   1.  Central protrusion of the disc at C3-4 with extension almost to the  cord similar to previous exam of 05/29/06.  There is noted broad-based  bulging at C4-5.  2.     Postoperative changes at C5-6 with apparent fusion. Minimal central  protrusion at C6-7 as appreciated previously.  No abnormal signal is noted  in the cervical cord.   Thank you for the opportunity to contribute to the care of your patient.       Cervical CT wo contrast:  Results for orders placed in visit on 02/03/14  CT Cervical Spine Wo Contrast  Severe left C6-7 neural foraminal narrowing    Cervical CT w contrast:  Results for orders placed during the hospital encounter of 12/28/06  CT Cervical Spine W Contrast   Narrative Addendum Begins Original report by Dr. Chauncey Fischer.  Following addendum by Dr. Jonelle Sports. Shepherd on 12/28/06: Patient had a myelogram today.  The patient's wife contacted Byrd Hesselbach, the front office administrator tonight concerning the fact that her husband had itching of the pelvis and lower extremities.  Patient asked about the use of Benadryl.  Byrd Hesselbach told the patient's wife that this was acceptable and hopefully it will take care of the itching.  The lady was instructed to take her husband to the Emergency Department if  itching persisted or the condition worsened.   Addendum Ends Clinical Data:  60 year old male with persistent neck pain following fusion.   CERVICAL MYELOGRAM: Procedure:  After obtaining informed written consent for the procedure, the patient was brought to Fluoroscopy.  He was placed on the table in the prone position.  He had been given Valium 10 mg prior to the procedure.  The L2-3 interspace was localized on the right.  The skin was prepped and draped in the usual sterile fashion.  Superficial soft tissues were anesthetized with 1% lidocaine.  A 22 gauge spinal needle was advanced into the subarachnoid space under direct fluoroscopic guidance.  Clear CSF was returned.  A total of 10 cc Omnipaque 300 was then administered.  The stylet was replaced and the needle removed.  The patient was tilted into Trendelenburg position and contrast was observed to transfer into the cervical region.  Following conventional imaging and CT, the patient was observed for one and a half hours prior to discharge in stable neurologic condition.  Findings:  The patient is status post fusion at C5-6.  There is normal filling of nerve roots above the C5 level.  Contrast is somewhat more faint at C5-6 and C6-7 on the left but there appears to be normal filling.  No focal ventricle defects are identified.  There is somewhat poor filling of the right C5-6 and C6-7 foramina. IMPRESSION: 1.  Slight decreased opacification of the C5-6 and C6-7 foramina on the right side.  Please see CT for further details. CT CERVICAL SPINE WITH CONTRAST (POST MYELOGRAM): Technique:  Multidetector CT imaging of the cervical spine was performed after intrathecal injection of contrast.  Multiplanar CT image reconstructions were also generated. Findings:  As stated, the patient is status post fusion at C5-6.  Fusion hardware is stable in position.  There does appear to be osseous fusion across the C5-6 level between the disk spacers.  The radiolucent  plate is present anteriorly. Craniocervical junction is unremarkable.  Vertebral body heights are maintained.  There is straightening of the normal lordosis as was seen on the prior exam.  Individual disk levels are as follows: C2-3:  Negative. C3-4:  Shallow central protrusion is similar to the prior exam.   C4-5:  Broad-based central protrusion is evident. There is some uncovertebral spurring but no significant stenosis.   C5-6:  There is persistent disk material extending into the right neural foramen.  There is also some uncovertebral spurring which narrows the foramen.  The central canal is patent.   C6-7:  Minimal disk bulging is unchanged.   C7-T1:  Negative. IMPRESSION: 1.  Residual right foraminal narrowing at C6-7 due to a combination of disk material and uncovertebral spurring.  This likely affects the C6 nerve root. 2.  Minimal central protrusions at C3-4 and C4-5 are unchanged. 3.  Mild disk bulging at C6-7 is stable.  Provider: Prescott Parma   Cervical DG Myelogram views:  Results for orders placed during the hospital encounter of 12/28/06  DG Myelogram Cervical   Narrative Addendum Begins Original report by Dr. Chauncey Fischer.  Following addendum by Dr. Jonelle Sports. Shepherd on 12/28/06: Patient had a myelogram today.  The patient's wife contacted Byrd Hesselbach, the front office administrator tonight concerning the fact that her husband had itching of the pelvis and lower extremities.  Patient asked about the use of Benadryl.  Byrd Hesselbach told the patient's wife that this was acceptable and hopefully it will take care of the itching.  The lady was instructed to take her husband to the Emergency Department if itching persisted or the condition worsened.   Addendum Ends Clinical Data:  60 year old male with persistent neck pain following fusion.   CERVICAL MYELOGRAM: Procedure:  After obtaining informed written consent for the procedure, the patient was brought to Fluoroscopy.  He was  placed on the table in the prone position.  He had been given Valium 10 mg prior to the procedure.  The L2-3 interspace was localized on the right.  The skin was prepped and draped in the usual sterile fashion.  Superficial soft tissues were anesthetized with 1% lidocaine.  A 22 gauge spinal needle was advanced into the subarachnoid space under direct fluoroscopic guidance.  Clear CSF was returned.  A total of 10 cc Omnipaque 300 was then administered.  The stylet was replaced and the needle removed.  The patient was tilted into Trendelenburg position and contrast was observed to transfer into the cervical region.  Following conventional imaging and CT, the patient was observed for one and a half hours prior to discharge in stable neurologic condition.  Findings:  The patient is status post fusion at C5-6.  There is normal filling of nerve roots above the C5 level.  Contrast is somewhat more faint at C5-6 and C6-7 on the left but there appears to be normal filling.  No focal ventricle defects are identified.  There is somewhat poor filling of the right C5-6 and C6-7 foramina. IMPRESSION: 1.  Slight decreased opacification of the C5-6 and C6-7 foramina on the right side.  Please see CT for further details. CT CERVICAL SPINE WITH CONTRAST (POST MYELOGRAM): Technique:  Multidetector CT imaging of the cervical spine was performed after intrathecal injection of contrast.  Multiplanar CT image reconstructions were also generated. Findings:  As stated, the patient is status post fusion at C5-6.  Fusion hardware is stable in position.  There does appear to be osseous fusion across the C5-6 level between the disk spacers.  The radiolucent plate is present anteriorly. Craniocervical junction is unremarkable.  Vertebral body heights are maintained.  There is straightening of the normal lordosis as was seen on the prior exam.  Individual disk levels are as follows: C2-3:  Negative. C3-4:  Shallow central protrusion is  similar to the prior exam.   C4-5:  Broad-based central protrusion is evident. There is some uncovertebral spurring but no significant stenosis.   C5-6:  There is persistent disk material extending into the right neural foramen.  There is also some uncovertebral spurring which narrows the foramen.  The central canal is patent.   C6-7:  Minimal disk bulging is unchanged.   C7-T1:  Negative. IMPRESSION: 1.  Residual right foraminal narrowing at C6-7 due to a combination of disk material and uncovertebral spurring.  This likely affects the C6 nerve root. 2.  Minimal central protrusions at C3-4 and C4-5 are unchanged. 3.  Mild disk bulging at C6-7 is stable.  Provider: Donnita FallsKindra Mabe   Shoulder Imaging: Shoulder-L MR wo contrast:  Results for orders placed in visit on 05/20/07  MR Shoulder Left Wo Contrast   Narrative * PRIOR REPORT IMPORTED FROM AN EXTERNAL SYSTEM *   PRIOR REPORT IMPORTED FROM THE [facility] WORKFLOW SYSTEM   REASON FOR EXAM:    left shoulder pain mid to low back pain  COMMENTS:   PROCEDURE:     MR  - MR SHOULDER LT  WO CONTRAST  - May 20 2007  7:49PM   RESULT:     Multiplanar images were obtained through the symptomatic LEFT  shoulder.   There is a downsloping acromion with a subacromial spur. There is mild AC  joint hypertrophy. This results in an impingement type anatomy. The  rotator  cuff appears intact. A very small amount of intrasubstance increased  signal  is noted which likely reflects an element of tendinosis. Marrow signal  within the humeral head is within the limits of normal with the exception  of  minimal cystic change in the region of the greater tuberosity which is  likely degenerative in nature. The bony glenoid exhibits no acute  abnormality. There is likely a small cyst at the base of the coracoid.   IMPRESSION:  1. There is an impingement type anatomy and there are findings that likely  reflect tendinosis of the rotator cuff. A complete tear is  not identified.  2. I do not see evidence of bone marrow edema involving the humeral head  or  bony glenoid. There is likely a small cyst at the base of the coracoid.  3. No significant joint effusion is identified.  4. Orthopedic evaluation may be useful.   Thank you for the opportunity to contribute to the care of your patient.       Thoracic Imaging: Thoracic MR wo contrast:  Results for orders placed in visit on 05/20/07  MR T Spine Ltd W/O Cm   Narrative * PRIOR REPORT IMPORTED FROM AN EXTERNAL SYSTEM *   PRIOR REPORT IMPORTED FROM THE [facility] WORKFLOW SYSTEM   REASON FOR EXAM:    left shoulder pain mid to low back pain  COMMENTS:   PROCEDURE:     MR  - MR THORACIC SPINE WO  - May 20 2007  7:44PM   RESULT:     The patient is being evaluated for unexplained rib cage  discomfort and LEFT shoulder discomfort. Sagittal and axial T1 and  effectively T2-weighted images were obtained through the thoracic spine. A  Vitamin E capsule has been placed posterior to the T6 level. The thoracic  vertebral bodies are preserved in height. The marrow signal is normal. The  signal within the thoracic spinal cord  is also normal in appearance. Axial  imaging was performed from approximately T4-T5 to the midbody of T10. The  thoracic spinal canal throughout these levels is normal in caliber on the  axial images. No abnormal paravertebral soft tissue densities are seen.   IMPRESSION:  1. There is no evidence of a thoracic HNP nor of high-grade spinal  stenosis.  The caliber of the bony spinal canal in the thoracic spine is a relatively  uniform 10 to 12 mm.  2. No high-grade disc space narrowing is seen and there is no evidence of  marrow edema or other marrow abnormalities of the thoracic vertebral  bodies.  No compression fracture is seen.  3. I see no abnormal paravertebral soft tissue signal.  Given the  patient's  symptoms MRI of the cervical spine may be useful.   Thank you for the  opportunity to contribute to the care of your patient.       Lumbosacral Imaging: Lumbar CT wo contrast:  Results for orders placed in visit on 02/03/14  CT Lumbar Spine Wo Contrast  L1-2 and L2-3 mild facet hypertrophy  L3-4 mild leftward disc bulging  L4-5 broad-based disc herniation. Moderate facet hypertrophy and ligamentum flavum thickening. Mild left lateral recess narrowing.  L5-S1 shallow central disc protrusion.  Degenerative changes of the SI joint, bilaterally.    Note: Imaging reviewed.  Meds  The patient has a current medication list which includes the following prescription(s): aspirin, atorvastatin, b complex vitamins, cetirizine, vitamin d3, clindamycin, clonazepam, cranberry, desonide, fluoxetine, fluoxetine, fluticasone, furosemide, garlic, multiple vitamins-minerals, naloxone hcl, naproxen, fish oil, omeprazole, oxycodone, oxycodone, oxycodone, oxycodone, oxycodone, oxycodone, saw palmetto, sildenafil, vitamin b complex-c, and benefiber.  Current Outpatient Prescriptions on File Prior to Visit  Medication Sig  . aspirin 81 MG tablet Take 81 mg by mouth daily.    Marland Kitchen atorvastatin (LIPITOR) 20 MG tablet Take 20 mg by mouth daily.  Marland Kitchen b complex vitamins tablet Take 1 tablet by mouth daily.    . cetirizine (ZYRTEC) 10 MG tablet Take 10 mg by mouth daily. As needed  . Cholecalciferol (VITAMIN D3) 1000 UNITS CAPS Take 2 Units by mouth daily.   . clindamycin (CLEOCIN) 150 MG capsule Take 150 mg by mouth daily. 4 caps 1 hour before dental procedures.   . clonazePAM (KLONOPIN) 1 MG tablet TAKE 1 BY MOUTH 3 TIMES DAILY AS NEEDED FOR ANXIETY  . desonide (DESOWEN) 0.05 % ointment Apply topically 2 (two) times daily. (Patient taking differently: Apply topically as needed. )  . FLUoxetine (PROZAC) 20 MG capsule TAKE 1 BY MOUTH DAILY  . FLUoxetine (PROZAC) 40 MG capsule TAKE 1 BY MOUTH DAILY  . fluticasone (FLONASE) 50 MCG/ACT nasal spray USE 2 SPRAYS IN EACH NOSTRIL DAILY  .  furosemide (LASIX) 20 MG tablet TAKE 1 BY MOUTH DAILY  . Garlic 1000 MG CAPS Take 1 capsule by mouth daily.    . Multiple Vitamin (MULTIVITAMIN PO) Take 1 tablet by mouth daily.    . Naloxone HCl (NARCAN) 4 MG/0.1ML LIQD Spray into one nostril. Repeat with second device into other nostril after 2-3 minutes if no or minimal response.  . naproxen (NAPROSYN) 500 MG tablet TAKE 1 BY MOUTH TWICE DAILY WITH A MEAL  . Omega-3 Fatty Acids (FISH OIL) 1200 MG CAPS Take 2,400 mg by mouth 2 (two) times daily.   Marland Kitchen omeprazole (PRILOSEC) 20 MG capsule TAKE 1 BY MOUTH DAILY  . Saw Palmetto 450 MG CAPS Take 900 mg by mouth daily.   Marland Kitchen  sildenafil (REVATIO) 20 MG tablet Take 20 mg by mouth 3 (three) times daily.  . Wheat Dextrin (BENEFIBER) POWD Stir 2 tsp. TID into 4-8 oz of any non-carbonated beverage or soft food (hot or cold)   No current facility-administered medications on file prior to visit.    ROS  Constitutional: Denies any fever or chills Gastrointestinal: No reported hemesis, hematochezia, vomiting, or acute GI distress Musculoskeletal: Denies any acute onset joint swelling, redness, loss of ROM, or weakness Neurological: No reported episodes of acute onset apraxia, aphasia, dysarthria, agnosia, amnesia, paralysis, loss of coordination, or loss of consciousness  Allergies  [name] is allergic to ciprofloxacin; metformin; morphine and related; penicillins; and buprenorphine hcl.  PFSH  Medical:  Mr. Deleo  has a past medical history of Chronic pain; Depression; Sleep apnea; Kidney stones; Prostatitis; Anxiety; Pacemaker; Migraine; History of permanent cardiac pacemaker placement (05/31/2015); and History of kidney stones (April 12,2017). Family: family history includes Arthritis in his father and mother; Cancer in his father and mother; Diabetes in his mother; Heart block in his father; Heart disease in his father and mother. Surgical:  has past surgical history that includes Tonsillectomy;  Carpal tunnel release (2006); Ulnar nerve repair (2008); Cervical spine surgery (2008); heart block (2008); Rotator cuff repair; and Pacemaker insertion. Tobacco:  reports that he has never smoked. He does not have any smokeless tobacco history on file. Alcohol:  reports that he does not drink alcohol. Drug:  reports that he does not use illicit drugs.  Constitutional Exam  Vitals: Blood pressure 108/67, pulse 77, temperature 97.6 F (36.4 C), resp. rate 18, height 5\' 8"  (1.727 m), weight 224 lb (101.606 kg), SpO2 95 %. General appearance: Well nourished, well developed, and well hydrated. In no acute distress Calculated BMI/Body habitus: Body mass index is 34.07 kg/(m^2). (30-34.9 kg/m2) Obese (Class I) - 68% higher incidence of chronic pain Psych/Mental status: Alert and oriented x 3 (person, place, & time) Eyes: PERLA Respiratory: No evidence of acute respiratory distress  Cervical Spine Exam  Inspection: No masses, redness, or swelling Alignment: Symmetrical ROM: Functional: Decreased ROM Stability: No instability detected Muscle strength & Tone: Functionally intact Sensory: Movement-associated discomfort Palpation: Tender  Upper Extremity (UE) Exam    Side: Right upper extremity  Side: Left upper extremity  Inspection: No masses, redness, swelling, or asymmetry  Inspection: No masses, redness, swelling, or asymmetry  ROM:  ROM:  Functional: ROM is within functional limits [facility])  Functional: ROM is within functional limits [facility])  Muscle strength & Tone: Functionally intact  Muscle strength & Tone: Functionally intact  Sensory: Unimpaired  Sensory: Unimpaired  Palpation: Non-contributory  Palpation: Non-contributory   Thoracic Spine Exam  Inspection: No masses, redness, or swelling Alignment: Symmetrical ROM: Functional: ROM is within functional limits [facility]) Stability: No instability detected Sensory: Unimpaired Muscle strength & Tone: Functionally intact Palpation: No  complaints of tenderness  Lumbar Spine Exam  Inspection: No masses, redness, or swelling Alignment: Symmetrical ROM: Functional: ROM is within functional limits [facility]) Stability: No instability detected Muscle strength & Tone: Functionally intact Sensory: Unimpaired Palpation: No complaints of tenderness Provocative Tests: Lumbar Hyperextension and rotation test: deferred Patrick's Maneuver: deferred  Gait & Posture Assessment  Ambulation: Unassisted Gait: Unaffected Posture: WNL  Lower Extremity Exam    Side: Right lower extremity  Side: Left lower extremity  Inspection: No masses, redness, swelling, or asymmetry ROM:  Inspection: No masses, redness, swelling, or asymmetry ROM:  Functional: ROM is within functional limits [facility])  Functional: ROM  is within functional limits [facility])  Muscle strength & Tone: Functionally intact  Muscle strength & Tone: Functionally intact  Sensory: Unimpaired  Sensory: Unimpaired  Palpation: Non-contributory  Palpation: Non-contributory   Assessment & Plan  Primary Diagnosis & Pertinent Problem List: The primary encounter diagnosis was Chronic pain. 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  Visit Diagnosis: 1. Chronic pain   2. Encounter for therapeutic drug level monitoring   3. Long term current use of opiate analgesic   4. Opiate use (60 MME/Day)   5. Chronic neck pain (Location of Primary Source of Pain) (Bilateral) (L>R)   6. Chronic shoulder pain, left   7. 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 Chronic sacroiliac joint pain (Bilateral)   9. Osteoarthritis of sacroiliac joint (Bilateral)   10. Lumbar facet hypertrophy (L1-2, L2-3, and L4-5) (Bilateral)   11. Lumbar IVDD (intervertebral disc displacement)   12. 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 Lumbar facet syndrome (Bilateral)   14. Primary osteoarthritis of left shoulder   15. Primary osteoarthritis of both knees   16. Cervical facet syndrome (Bilateral) (L>R)   17. Cervical spondylosis with radiculopathy (Bilateral) (L>R)   18. Cervicogenic headache   19. 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 L4-5: Broad-based disc herniation. L5-S1: Central disc protrusion.   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)  Lumbar facet syndrome (Bilateral)  Osteoarthritis of shoulder (Left)   Impingement type anatomy. Tendinosis of the rotator cuff.   Osteoarthritis of knee (Bilateral) (R>L)    Problem-specific Plan(s): No problem-specific assessment & plan notes found for this encounter.  No new assessment & plan notes have been filed under this hospital service since the last note was generated. 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) (Chronic)   Relevant Orders   CERVICAL EPIDURAL STEROID INJECTION   Cervical spondylosis with radiculopathy (Bilateral) (L>R) (Chronic)   Relevant Medications   oxyCODONE (OXY IR/ROXICODONE) 5 MG immediate release tablet   oxyCODONE (OXY IR/ROXICODONE) 5 MG immediate release tablet   oxyCODONE (OXY IR/ROXICODONE) 5 MG immediate release tablet   oxyCODONE (OXYCONTIN) 10 mg 12 hr tablet   oxyCODONE (OXYCONTIN) 10 mg 12 hr tablet   oxyCODONE (OXYCONTIN) 10 mg 12 hr tablet   Other Relevant Orders   CERVICAL EPIDURAL STEROID INJECTION   Cervicogenic headache (Chronic)   Relevant  Medications   oxyCODONE (OXY IR/ROXICODONE) 5 MG immediate release tablet   oxyCODONE (OXY IR/ROXICODONE) 5 MG immediate release tablet   oxyCODONE (OXY IR/ROXICODONE) 5 MG immediate release tablet   oxyCODONE (OXYCONTIN) 10 mg 12 hr tablet   oxyCODONE (OXYCONTIN) 10 mg 12 hr tablet   oxyCODONE (OXYCONTIN) 10 mg 12 hr tablet   Other Relevant Orders   CERVICAL FACET (MEDIAL BRANCH NERVE BLOCK)    CERVICAL EPIDURAL STEROID INJECTION   Chronic knee pain (Bilateral) (R>L) (Chronic)   Relevant Medications   oxyCODONE (OXY IR/ROXICODONE) 5 MG immediate release tablet   oxyCODONE (OXY IR/ROXICODONE) 5 MG immediate release tablet   oxyCODONE (OXY IR/ROXICODONE) 5 MG immediate release tablet   oxyCODONE (OXYCONTIN) 10 mg 12 hr tablet   oxyCODONE (OXYCONTIN) 10 mg 12 hr tablet   oxyCODONE (OXYCONTIN) 10 mg 12 hr tablet   Other Relevant Orders   KNEE INJECTION   GENICULAR NERVE BLOCK   Chronic neck pain (Location of Primary Source of Pain) (Bilateral) (L>R) (Chronic)   Relevant Medications   oxyCODONE (OXY IR/ROXICODONE) 5 MG immediate release tablet   oxyCODONE (OXY IR/ROXICODONE) 5 MG immediate release tablet   oxyCODONE (OXY IR/ROXICODONE) 5 MG immediate release tablet    oxyCODONE (OXYCONTIN) 10 mg 12 hr tablet   oxyCODONE (OXYCONTIN) 10 mg 12 hr tablet   oxyCODONE (OXYCONTIN) 10 mg 12 hr tablet   Other Relevant Orders   CERVICAL FACET (MEDIAL BRANCH NERVE BLOCK)    Chronic pain - Primary (Chronic)   Relevant Medications   oxyCODONE (OXY IR/ROXICODONE) 5 MG immediate release tablet   oxyCODONE (OXY IR/ROXICODONE) 5 MG immediate release tablet   oxyCODONE (OXY IR/ROXICODONE) 5 MG immediate release tablet   oxyCODONE (OXYCONTIN) 10 mg 12 hr tablet   oxyCODONE (OXYCONTIN) 10 mg 12 hr tablet   oxyCODONE (OXYCONTIN) 10 mg 12 hr tablet   Other Relevant Orders   25-Hydroxyvitamin D Lcms D2+D3   Vitamin B12 (Completed)   Chronic sacroiliac joint pain (Bilateral) (Chronic)   Relevant Medications   oxyCODONE (OXY IR/ROXICODONE) 5 MG immediate release tablet   oxyCODONE (OXY IR/ROXICODONE) 5 MG immediate release tablet   oxyCODONE (OXY IR/ROXICODONE) 5 MG immediate release tablet   oxyCODONE (OXYCONTIN) 10 mg 12 hr tablet   oxyCODONE (OXYCONTIN) 10 mg 12 hr tablet   oxyCODONE (OXYCONTIN) 10 mg 12 hr tablet   Other Relevant Orders   SACROILIAC JOINT INJECTINS   Chronic shoulder pain (Bilateral) (L>R) (Chronic)   Relevant Orders   SUPRASCAPULAR NERVE BLOCK   SHOULDER INJECTION   Lumbar facet hypertrophy (L1-2, L2-3, and L4-5) (Bilateral) (Chronic)   Relevant Orders   LUMBAR FACET(MEDIAL BRANCH NERVE BLOCK) MBNB   Lumbar facet syndrome (Bilateral) (Chronic)   Relevant Medications   oxyCODONE (OXY IR/ROXICODONE) 5 MG immediate release tablet   oxyCODONE (OXY IR/ROXICODONE) 5 MG immediate release tablet   oxyCODONE (OXY IR/ROXICODONE) 5 MG immediate release tablet   oxyCODONE (OXYCONTIN) 10 mg 12 hr tablet   oxyCODONE (OXYCONTIN) 10 mg 12 hr tablet   oxyCODONE (OXYCONTIN) 10 mg 12 hr tablet   Other Relevant Orders   LUMBAR FACET(MEDIAL BRANCH NERVE BLOCK) MBNB   Lumbar IVDD (intervertebral disc displacement) (Chronic)   Relevant Orders   LUMBAR EPIDURAL  STEROID INJECTION   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) (Chronic)   Relevant Orders   LUMBAR EPIDURAL STEROID INJECTION   Osteoarthritis of knee (Bilateral) (R>L) (Chronic)   Relevant Medications   oxyCODONE (OXY IR/ROXICODONE) 5 MG immediate release tablet  oxyCODONE (OXY IR/ROXICODONE) 5 MG immediate release tablet   oxyCODONE (OXY IR/ROXICODONE) 5 MG immediate release tablet   oxyCODONE (OXYCONTIN) 10 mg 12 hr tablet   oxyCODONE (OXYCONTIN) 10 mg 12 hr tablet   oxyCODONE (OXYCONTIN) 10 mg 12 hr tablet   Other Relevant Orders   KNEE INJECTION   GENICULAR NERVE BLOCK   Osteoarthritis of sacroiliac joint (Bilateral) (Chronic)   Relevant Medications   oxyCODONE (OXY IR/ROXICODONE) 5 MG immediate release tablet   oxyCODONE (OXY IR/ROXICODONE) 5 MG immediate release tablet   oxyCODONE (OXY IR/ROXICODONE) 5 MG immediate release tablet   oxyCODONE (OXYCONTIN) 10 mg 12 hr tablet   oxyCODONE (OXYCONTIN) 10 mg 12 hr tablet   oxyCODONE (OXYCONTIN) 10 mg 12 hr tablet   Other Relevant Orders   SACROILIAC JOINT INJECTINS   Osteoarthritis of shoulder (Left) (Chronic)   Relevant Medications   oxyCODONE (OXY IR/ROXICODONE) 5 MG immediate release tablet   oxyCODONE (OXY IR/ROXICODONE) 5 MG immediate release tablet   oxyCODONE (OXY IR/ROXICODONE) 5 MG immediate release tablet   oxyCODONE (OXYCONTIN) 10 mg 12 hr tablet   oxyCODONE (OXYCONTIN) 10 mg 12 hr tablet   oxyCODONE (OXYCONTIN) 10 mg 12 hr tablet   Other Relevant Orders   SHOULDER INJECTION     Medium   Encounter for therapeutic drug level monitoring   Long term current use of opiate analgesic (Chronic)   Relevant Orders   ToxASSURE Select 13 (MW), Urine   Opiate use (60 MME/Day) (Chronic)       Pharmacotherapy (Medications Ordered): Meds ordered this encounter  Medications  . oxyCODONE (OXY IR/ROXICODONE) 5 MG immediate release tablet    Sig: Take 1 tablet (5 mg total) by mouth every 6 (six) hours as needed for severe  pain.    Dispense:  120 tablet    Refill:  0    Do not place this medication, or any other prescription from our practice, on "Automatic Refill". Patient may have prescription filled one day early if pharmacy is closed on scheduled refill date. Do not fill until: 02/29/16 To last until: 03/30/16  . oxyCODONE (OXY IR/ROXICODONE) 5 MG immediate release tablet    Sig: Take 1 tablet (5 mg total) by mouth every 6 (six) hours as needed for severe pain.    Dispense:  120 tablet    Refill:  0    Do not place this medication, or any other prescription from our practice, on "Automatic Refill". Patient may have prescription filled one day early if pharmacy is closed on scheduled refill date. Do not fill until: 03/30/16 To last until: 04/29/16  . oxyCODONE (OXY IR/ROXICODONE) 5 MG immediate release tablet    Sig: Take 1 tablet (5 mg total) by mouth every 6 (six) hours as needed for severe pain.    Dispense:  120 tablet    Refill:  0    Do not place this medication, or any other prescription from our practice, on "Automatic Refill". Patient may have prescription filled one day early if pharmacy is closed on scheduled refill date. Do not fill until: 04/29/16 To last until: 05/29/16  . oxyCODONE (OXYCONTIN) 10 mg 12 hr tablet    Sig: Take 1 tablet (10 mg total) by mouth every 12 (twelve) hours.    Dispense:  60 tablet    Refill:  0    Do not place this medication, or any other prescription from our practice, on "Automatic Refill". Patient may have prescription filled one day early if pharmacy is  closed on scheduled refill date. Do not fill until: 02/29/16 To last until: 03/30/16  . oxyCODONE (OXYCONTIN) 10 mg 12 hr tablet    Sig: Take 1 tablet (10 mg total) by mouth every 12 (twelve) hours.    Dispense:  60 tablet    Refill:  0    Do not place this medication, or any other prescription from our practice, on "Automatic Refill". Patient may have prescription filled one day early if pharmacy is closed  on scheduled refill date. Do not fill until: 03/30/16 To last until: 04/29/16  . oxyCODONE (OXYCONTIN) 10 mg 12 hr tablet    Sig: Take 1 tablet (10 mg total) by mouth every 12 (twelve) hours.    Dispense:  60 tablet    Refill:  0    Do not place this medication, or any other prescription from our practice, on "Automatic Refill". Patient may have prescription filled one day early if pharmacy is closed on scheduled refill date. Do not fill until: 04/29/16 To last until: 05/29/16    [facility] & Procedure Ordered: Orders Placed This Encounter  Procedures  . SUPRASCAPULAR NERVE BLOCK  . CERVICAL FACET (MEDIAL BRANCH NERVE BLOCK)   . CERVICAL EPIDURAL STEROID INJECTION  . LUMBAR EPIDURAL STEROID INJECTION  . LUMBAR FACET(MEDIAL BRANCH NERVE BLOCK) MBNB  . SACROILIAC JOINT INJECTINS  . SHOULDER INJECTION  . KNEE INJECTION  . GENICULAR NERVE BLOCK  . ToxASSURE Select 13 (MW), Urine  . 25-Hydroxyvitamin D Lcms D2+D3  . Vitamin B12    Imaging Ordered: None  Interventional Therapies: Scheduled:  None at this time.    Considering:   1. Diagnostic bilateral cervical facet block under fluoroscopic guidance and IV sedation.  2. Possible bilateral cervical facet radiofrequency ablation under fluoroscopic guidance and IV sedation.  3. Diagnostic left sided cervical epidural steroid injection under fluoroscopic guidance, with or without sedation.  4. Diagnostic bilateral intra-articular knee injection, without fluoroscopy or sedation.  5. Diagnostic bilateral genicular nerve blocks under fluoroscopic guidance, with or without sedation.  6. Possible bilateral genicular nerve radiofrequency ablation under fluoroscopic guidance and IV sedation.  7. Diagnostic bilateral sacroiliac joint block under fluoroscopic guidance, with or without sedation.  8. Possible bilateral sacroiliac joint radiofrequency ablation under fluoroscopic guidance and IV sedation.  9. Diagnostic bilateral intra-articular  shoulder joint injection under fluoroscopic guidance, with or without sedation.  10. Diagnostic bilateral suprascapular nerve block under fluoroscopic guidance, with a without sedation.  11. Possible bilateral suprascapular nerve radiofrequency ablation under fluoroscopic guidance and IV sedation.  12. Diagnostic bilateral lumbar facet block under fluoroscopic guidance and IV sedation.  13. Possible bilateral lumbar facet radiofrequency ablation under fluoroscopic guidance and IV sedation.  14. Diagnostic left L4-5 lumbar epidural steroid injection under fluoroscopic guidance, with or without sedation.    PRN Procedures:   1. Diagnostic bilateral cervical facet block under fluoroscopic guidance and IV sedation.  2. Diagnostic left sided cervical epidural steroid injection under fluoroscopic guidance, with or without sedation.  3. Diagnostic bilateral intra-articular knee injection, without fluoroscopy or sedation.  4. Diagnostic bilateral genicular nerve blocks under fluoroscopic guidance, with or without sedation.  5. Diagnostic bilateral sacroiliac joint block under fluoroscopic guidance, with or without sedation.  6. Diagnostic bilateral intra-articular shoulder joint injection under fluoroscopic guidance, with or without sedation.  7. Diagnostic bilateral suprascapular nerve block under fluoroscopic guidance, with a without sedation.  8. Diagnostic bilateral lumbar facet block under fluoroscopic guidance and IV sedation.  9. Diagnostic left L4-5 lumbar epidural steroid  injection under fluoroscopic guidance, with or without sedation.    Referral(s) or Consult(s): None at this time.  New Prescriptions   No medications on file    Medications administered during this visit: Mr. Launer had no medications administered during this visit.  Requested PM Follow-up: Return in about 3 months (around 05/15/2016) for Medication Management, (3-Mo), Procedure (PRN - Patient will call).  Future  Appointments Date Time Provider Department Center  04/11/2016 12:00 PM [name], MD [facility]  05/09/2016 1:30 PM Shelia Media, MD [facility] None  05/15/2016 1:40 PM [name], MD [facility] None    Primary Care Physician: Wynona Dove, MD Location: [facility] Note by: Sydnee Levans. Laban Emperor, M.D, DABA, DABAPM, DABPM, DABIPP, FIPP  Pain Score Disclaimer: We use the NRS-11 scale. This is a self-reported, subjective measurement of pain severity with only modest accuracy. It is used primarily to identify changes within a particular patient. It must be understood that outpatient pain scales are significantly less accurate that those used for research, where they can be applied under ideal controlled circumstances with minimal exposure to variables. In reality, the score is likely to be a combination of pain intensity and pain affect, where pain affect describes the degree of emotional arousal or changes in action readiness caused by the sensory experience of pain. Factors such as social and work situation, setting, emotional state, anxiety levels, expectation, and prior pain experience may influence pain perception and show large inter-individual differences that may also be affected by time variables.  Patient instructions provided during this appointment: Patient Instructions  You were given 3 prescriptions each for Oxycodone 5 mg and Oxycodone 10 mg. Please get you labs done today if possible.

## 2016-02-02 NOTE — Progress Notes (Signed)
Safety precautions to be maintained throughout the outpatient stay will include: orient to surroundings, keep bed in low position, maintain call bell within reach at all times, provide assistance with transfer out of bed and ambulation. Oxycodone 5 mg #44/120  Filled 01-06-16 oxycontin 10 mg #15/60  Filled 01-06-16

## 2016-02-05 LAB — 25-HYDROXY VITAMIN D LCMS D2+D3
25-Hydroxy, Vitamin D-2: <1 ng/mL
25-Hydroxy, Vitamin D-3: 63 ng/mL
25-Hydroxy, Vitamin D: 63 ng/mL

## 2016-02-10 LAB — TOXASSURE SELECT 13 (MW), URINE: PDF: 0

## 2016-02-15 ENCOUNTER — Encounter: Payer: Medicare Other | Admitting: Pain Medicine

## 2016-02-16 ENCOUNTER — Other Ambulatory Visit: Payer: Self-pay

## 2016-02-16 MED ORDER — CLONAZEPAM 1 MG PO TABS: ORAL_TABLET | ORAL | Status: DC

## 2016-02-16 NOTE — Telephone Encounter (Signed)
Please advise refill, last done in December, thanks

## 2016-03-02 DIAGNOSIS — I10 Essential (primary) hypertension: Secondary | ICD-10-CM | POA: Diagnosis not present

## 2016-03-02 DIAGNOSIS — R001 Bradycardia, unspecified: Secondary | ICD-10-CM | POA: Diagnosis not present

## 2016-03-02 DIAGNOSIS — R011 Cardiac murmur, unspecified: Secondary | ICD-10-CM | POA: Diagnosis not present

## 2016-03-13 DIAGNOSIS — N200 Calculus of kidney: Secondary | ICD-10-CM | POA: Diagnosis not present

## 2016-03-13 DIAGNOSIS — N2889 Other specified disorders of kidney and ureter: Secondary | ICD-10-CM | POA: Diagnosis not present

## 2016-03-13 DIAGNOSIS — R8299 Other abnormal findings in urine: Secondary | ICD-10-CM | POA: Diagnosis not present

## 2016-03-16 DIAGNOSIS — R82991 Hypocitraturia: Secondary | ICD-10-CM | POA: Insufficient documentation

## 2016-04-11 ENCOUNTER — Encounter: Payer: Self-pay | Admitting: Family Medicine

## 2016-04-11 ENCOUNTER — Ambulatory Visit (INDEPENDENT_AMBULATORY_CARE_PROVIDER_SITE_OTHER): Payer: Medicare Other | Admitting: Family Medicine

## 2016-04-11 VITALS — BP 138/72 | HR 66 | Temp 98.5°F | Ht 67.0 in | Wt 224.5 lb

## 2016-04-11 DIAGNOSIS — N200 Calculus of kidney: Secondary | ICD-10-CM

## 2016-04-11 DIAGNOSIS — G8929 Other chronic pain: Secondary | ICD-10-CM

## 2016-04-11 DIAGNOSIS — G894 Chronic pain syndrome: Secondary | ICD-10-CM

## 2016-04-11 DIAGNOSIS — M542 Cervicalgia: Secondary | ICD-10-CM

## 2016-04-11 DIAGNOSIS — F331 Major depressive disorder, recurrent, moderate: Secondary | ICD-10-CM | POA: Diagnosis not present

## 2016-04-11 DIAGNOSIS — E785 Hyperlipidemia, unspecified: Secondary | ICD-10-CM

## 2016-04-11 DIAGNOSIS — G4733 Obstructive sleep apnea (adult) (pediatric): Secondary | ICD-10-CM | POA: Insufficient documentation

## 2016-04-11 DIAGNOSIS — Z23 Encounter for immunization: Secondary | ICD-10-CM

## 2016-04-11 DIAGNOSIS — F411 Generalized anxiety disorder: Secondary | ICD-10-CM

## 2016-04-11 DIAGNOSIS — Z950 Presence of cardiac pacemaker: Secondary | ICD-10-CM

## 2016-04-11 NOTE — Assessment & Plan Note (Signed)
H/o heart block, s/p dual chamber pacemaker. Followed by Dr Lady GaryFath cardiology.

## 2016-04-11 NOTE — Progress Notes (Signed)
BP 138/72   Pulse 66   Temp 98.5 F (36.9 C) (Oral)   Ht 5\' 7"  (1.702 m)   Wt 224 lb 8 oz (101.8 kg)   SpO2 96%   BMI 35.16 kg/m    CC: transfer care from Dr Dan Humphreys Subjective:    Patient ID: Mark [name], male    DOB: 1956-07-06, 60 y.o.   MRN: [id number]  HPI: Mark [name] is a 60 y.o. male presenting on 04/11/2016 for Establish Care (transfer from Dr Dan Humphreys)   [facility] records which will be reviewed.  H/o recurrent kidney stones - sees Dr Lonna Cobb, recently started on potassium citrate 1080mg  2 tab daily. Has had several stone removal procedures (calcium + uric acid).  Chronic pain - h/o C5/6 ruptured disc leading to pinched nerve, mild L arm weakness. S/p cervical surgery 2008. Oxycodone/oxycontin prescribed by pain management Dr Laban Emperor.   Sleep apnea - trouble finding well-fitting mask so he stopped. Non-restorative sleep. Some snoring. No PNDyspnea. Denies apnea.  Endorses faint murmur followed by Dr Lady Gary cardiology.  Relevant past medical, surgical, family and social history reviewed and updated as indicated. Interim medical history since our last visit reviewed. Allergies and medications reviewed and updated. Current Outpatient Prescriptions on File Prior to Visit  Medication Sig  . aspirin 81 MG tablet Take 81 mg by mouth daily.    Marland Kitchen atorvastatin (LIPITOR) 20 MG tablet Take 20 mg by mouth daily.  . cetirizine (ZYRTEC) 10 MG tablet Take 10 mg by mouth daily. As needed  . clindamycin (CLEOCIN) 150 MG capsule Take 150 mg by mouth daily. 4 caps 1 hour before dental procedures.   . clonazePAM (KLONOPIN) 1 MG tablet TAKE 1 BY MOUTH 3 TIMES DAILY AS NEEDED FOR ANXIETY  . Cranberry 400 MG CAPS Take by mouth.  . desonide (DESOWEN) 0.05 % ointment Apply topically 2 (two) times daily. (Patient taking differently: Apply topically as needed. )  . FLUoxetine (PROZAC) 20 MG capsule TAKE 1 BY MOUTH DAILY  . FLUoxetine (PROZAC) 40 MG capsule TAKE 1 BY MOUTH DAILY  . fluticasone  (FLONASE) 50 MCG/ACT nasal spray USE 2 SPRAYS IN EACH NOSTRIL DAILY  . furosemide (LASIX) 20 MG tablet TAKE 1 BY MOUTH DAILY  . Garlic 1000 MG CAPS Take 1 capsule by mouth daily.    . Multiple Vitamin (MULTIVITAMIN PO) Take 1 tablet by mouth daily.    . Naloxone HCl (NARCAN) 4 MG/0.1ML LIQD Spray into one nostril. Repeat with second device into other nostril after 2-3 minutes if no or minimal response.  . naproxen (NAPROSYN) 500 MG tablet TAKE 1 BY MOUTH TWICE DAILY WITH A MEAL  . Omega-3 Fatty Acids (FISH OIL) 1200 MG CAPS Take 2,400 mg by mouth 2 (two) times daily.   Marland Kitchen omeprazole (PRILOSEC) 20 MG capsule TAKE 1 BY MOUTH DAILY  . oxyCODONE (OXYCONTIN) 10 mg 12 hr tablet Take 1 tablet (10 mg total) by mouth every 12 (twelve) hours.  . Saw Palmetto 450 MG CAPS Take 900 mg by mouth daily.   . sildenafil (REVATIO) 20 MG tablet Take 20 mg by mouth 3 (three) times daily.  Marland Kitchen VITAMIN B COMPLEX-C CAPS Take by mouth.   No current facility-administered medications on file prior to visit.     Review of Systems Per HPI unless specifically indicated in ROS section     Objective:    BP 138/72   Pulse 66   Temp 98.5 F (36.9 C) (Oral)   Ht 5\' 7"  (  1.702 m)   Wt 224 lb 8 oz (101.8 kg)   SpO2 96%   BMI 35.16 kg/m   Wt Readings from Last 3 Encounters:  04/11/16 224 lb 8 oz (101.8 kg)  02/02/16 224 lb (101.6 kg)  11/29/15 224 lb (101.6 kg)    Physical Exam  Constitutional: He is oriented to person, place, and time. He appears well-developed and well-nourished. No distress.  HENT:  Head: Normocephalic and atraumatic.  Mouth/Throat: Oropharynx is clear and moist. No oropharyngeal exudate.  Eyes: Conjunctivae and EOM are normal. Pupils are equal, round, and reactive to light.  Neck: Normal range of motion. Neck supple.  Cardiovascular: Normal rate, regular rhythm, normal heart sounds and intact distal pulses.   No murmur (not appreciated today) heard. Pulmonary/Chest: Effort normal and breath  sounds normal. No respiratory distress. He has no wheezes. He has no rales.  Musculoskeletal: He exhibits no edema.  Lymphadenopathy:    He has no cervical adenopathy.  Neurological: He is alert and oriented to person, place, and time.  Skin: Skin is warm and dry. No rash noted.  Psychiatric: He has a normal mood and affect.  Nursing note and vitals reviewed.  Results for orders placed or performed during the hospital encounter of 02/02/16  25-Hydroxyvitamin D Lcms D2+D3  Result Value Ref Range   25-Hydroxy, Vitamin D 63 ng/mL   25-Hydroxy, Vitamin D-2 <1.0 ng/mL   25-Hydroxy, Vitamin D-3 63 ng/mL  Vitamin B12  Result Value Ref Range   Vitamin B-12 861 180 - 914 pg/mL      Assessment & Plan:   Problem List Items Addressed This Visit    Chronic neck pain (Location of Primary Source of Pain) (Bilateral) (L>R) (Chronic)    Followed by pain management s/p cervical surgery.      Relevant Medications   oxyCODONE (OXY IR/ROXICODONE) 5 MG immediate release tablet   Chronic pain syndrome - Primary    Followed by pain management Dr Shireen QuanNaviera.       GAD (generalized anxiety disorder)    Regularly takes 1-3 klonopin daily. Will need to review and update controlled substance agreement with patient next visit.       History of permanent cardiac pacemaker placement    H/o heart block, s/p dual chamber pacemaker. Followed by Dr Lady GaryFath cardiology.      Hyperlipidemia    Chronic, stable. Continue lipitor which was recently started by cardiology.      MDD (major depressive disorder), recurrent episode, moderate (HCC)    Chronic, stable on fluoxetine and klonopin. Was previously receiving 10mo supply klonopin. Advised I typically do 1 mo supply controlled substances.       Nephrolithiasis    Appreciate urology care.       Relevant Medications   oxyCODONE (OXY IR/ROXICODONE) 5 MG immediate release tablet   potassium citrate (UROCIT-K) 10 MEQ (1080 MG) SR tablet   Obstructive sleep apnea     H/o this, pt was unable to find correctly fitting mask so he was not treated for this. Will need to continue to monitor, consider return for re eval.        Other Visit Diagnoses    Need for influenza vaccination       Relevant Orders   Flu Vaccine QUAD 36+ mos PF IM (Fluarix & Fluzone Quad PF) (Completed)       Follow up plan: Return in about 4 months (around 08/11/2016), or as needed, for medicare wellness visit.  Eustaquio BoydenJavier Camaron Cammack, MD

## 2016-04-11 NOTE — Assessment & Plan Note (Signed)
Followed by pain management Dr Shireen QuanNaviera.

## 2016-04-11 NOTE — Assessment & Plan Note (Signed)
H/o this, pt was unable to find correctly fitting mask so he was not treated for this. Will need to continue to monitor, consider return for re eval.

## 2016-04-11 NOTE — Assessment & Plan Note (Signed)
Chronic, stable on fluoxetine and klonopin. Was previously receiving [id number] McalpiKatrinka Blazin578WUJ0Annam51arEveraFreeport-McMoRan Cop782958295Beat2161Dud43Malachy3524454016M71alTheo259132444316109816161604553-(574)068-001611625098WUL16184869161Marga49r3e16RUE573JoellCr95409657413GlCovenant Child1961610913ArbovaS16401916418Georgann 65H421995546219320KendrickCaleen 6w(94454409Craige Cot8178Caleen 16649163551Ferna40256 853 639Gerilyn Pilgri7766440912848[facility] Med10m16101610904758161092116541610Michele McalpiKatrinka Blazin578WUJ0Annam35arEveraFreeport-McMoRan Cop788295Beat64ri96East 161Dudl63Malachy366454016M42alTheo24913277816109816161604532-(681)503-051611669098WUL161969161Marga66r41e16RUE:84257CJoellCr95409657565GlTrinity 1961610914WilsonvilS165613816676Georgann 65H811995436219210KendrickCaleen 22w57454409Craige Cot3278Caleen 1635916681Ferna4332-607-279Gerilyn Pilgri576m42El40949881T[place]cal[place] Surgica67m16101610904523161092119541610Michele McalpiKatrinka Blazin578WUJ0Annam78arEveraFreeport-McMoRan Cop7829558295Beat65ri96Manhat161Dudl55Malachy4610454016M77alTheo27413225916109816161604552-302 509 99161163098WUL16127069161Marga46r18e16R5791UJoellCr95409657337GlFisher County1961610916WeavS16511441625(3Georgann 55H371995746219810KendrickCaleen 43w(431454409Craige Cot1378Caleen 16462168183Ferna40928 177 289Gerilyn Pilgri6532m814L40933876[facility]73m161031610904988161092156541610Michele McalpiKatrinka Blazin578WUJ0Annam91arEveraFreeport-McMoRan Cop782958295Beat37ri96Vict161Dudl14Malachy217454016M87alTheo251132624816109816161604571-(502) 138-821611661098WUL161(41469161Marga13r21e16RUE:8Is6437lJoellCr954096573489GlThe Con1961610913HerndS1641211659(5Georgann 21H321995456219310KendrickCaleen 77w(74454409Craige Cot3578Caleen 1667164657Ferna40669-233-829Gerilyn Pilgr74i640966859[facility] Sur60m16116109041015161092176541610Michele McalpiKatrinka Blazin578WUJ0Annam73arEveraFreeport-McMoRan Cop788295Beat4161Dudl1Malachy8812454016M82alTheo278132518116109816161604571-(930)263-671611666098WUL161669161Marga41r62e16RUE:34Ar6441nJoellCr954096577463GlMountain View Region1961610917TerrytoS16381171675(4Georgann 71H281995936219750KendrickCaleen 1w20454409Craige Cot4978Caleen 16865162216Ferna40772793239Gerilyn Pilgri617368m440939869Schneck Med53m16101610904881161092138541610Michele McalpiKatrinka Blazin578WUJ0Annam64arEveraFreeport-McMoRan Cop7829558295Beat65ri9161Dudl57Malachy9386454016M34alTheo242132742716109816161604590-(575)820-461611643098WUL16136769161Marga60r48e16RUE:7425JoellCr954096576474GlVa [place] Hea7Fall1961610914PiedS164417116725Georgann 7H36199592621950KendrickCaleen 17w(90454409Craige Cot2678Caleen 16671163973Ferna40437847249Gerilyn Pilgri3282m4L62ak40936843Laredo Laser 56m1610501610904513161092139541610Michele McalpiKatrinka Blazin578WUJ0Annam66arEveraFreeport-McMoRan Cop78298295Beat1ri161Dudl63Malachy329454016M70alTheo26913275616109816161604553-(346)120-18161167098WUL16176369161Marga43r17e16RUE:5Brant 234LJoellCr954096577618GlTexas Precision Surg302North S1961610912New ProvidenS16691471654(2Georgann 69H231995656219360KendrickCaleen 35w86454409Craige Cot6478Caleen 16721610587Ferna40905-135-419Gerilyn Pilgri54474095821[facility]66m16101610904671161092142541610Michele McalpiKatrinka Blazin578WUJ0Annam14arEveraFreeport-McMoRan Cop7828295Beat10ri9161Dudl8Malachy7508454016M59alTheo25113230616109816161604557-(364)779-561611689098WUL161(705)69161Marga75r26e16RUE1455:JoellCr954096572424GlRainbow Babies And Chil311961610914MachiS166118016479Georgann 14H211995776219250KendrickCaleen 21w41454409Craige Cot178Caleen 16340163640Ferna40804-449-629Gerilyn Pilgri4673574098866[place] Coun98m16101610904501161092174541610Michele McalpiKatrinka Blazin578WUJ0Annam25arEveraFreeport-McMoRan Cop7829558295Beat12ri9161Dud15Malachy4212454016M56alTheo22513231716109816161604531-404-728-411611621098WUL161(843)69161Marga10r73e16RUE:44093JoellCr954096578762GlHaven Behavioral Hospital1961610917EspanoS1666178163Georgann 14H301995636219710KendrickCaleen 58w77454409Craige Cot7378Caleen 1633616652Ferna40437814979Gerilyn Pilgri348619m40919817[facility]32m16101610904262161092114541610Michele McalpiKatrinka Blazin578WUJ0Annam19arEveraFreeport-McMoRan Cop782955678295Beat49ri96H161Dudl36Malachy369454016M37alTheo247132756216109816161604569-430111241611634098WUL16125069161Marga47r51e16RUE:75[place] 6632UJoellCr954096574743GlWayne CM1961610917Pinos AltS161813516799Georgann 81H421995326219770KendrickCaleen 74w38454409Craige Cot678Caleen 1663165829Ferna40934-070-389Gerilyn Pilgri566749m40946832[facility]45m161047161090446161092123541610Michele McalpiKatrinka Blazin578WUJ0Annam42arEveraFreeport-McMoRan Cop78298295Beat32r161Dudl57Malachy277454016M10alTheo216132515816109816161604519-807-345-131611636098WUL161869161Marga46r8e16RUE9971:JoellCr95409657602GlPhiladeLPhia1961610916JonesborouS16861401684(8Georgann 71H62199557621980KendrickCaleen 39w(77454409Craige Cot6578Caleen 166116710Ferna40501-300-189Gerilyn Pilgri7343540965832Miami Vall62m161051610904104161092199541610Michele McalpiKatrinka Blazin578WUJ0Annam68arEveraFreeport-McMoRan Cop78298295Beat54ri96Nep161Dudl82Malachy745454016M69alTheo221132686016109816161604566-(343) 450-351611646098WUL16195969161Marga30r21e16RUE:4293JoellCr954096575748GlFranklin County Mem6World Go1961610914MilltoS167216316566Georgann 18H25199576219550KendrickCaleen 23w(64454409Craige Cot478Caleen 168166026Ferna40(845)606-079Gerilyn Pilgri6396040937846W.J. Mangold Memori52m16101610904442161092112541610Michele McalpiKatrinka Blazin578WUJ0Annam58arEveraFreeport-McMoRan Cop78298295Beat91ri96Fr161Dudl28Malachy657454016M58alTheo25813277401610981616160452-517-330-391611643098WUL16155169161Marga47r70e16RU2625EJoellCr954096575969GlShawnee Mission Sur19616109110StantS168017516512Georgann 90H671995116219710KendrickCaleen 28w454409Craige Cot5378Caleen 1662161670Ferna40226 544 829Gerilyn Pilgri394747m4068983Riverland Med72m1610161090441616109211541610Michele McalpiKatrinka Blazin578WUJ0Annam60arEveraFreeport-McMoRan Cop7829558295Beat10ri161Dudl73Malachy2213454016M72alTheo261132869616109816161604510-(707) 209-901611647098WUL16143469161Marga59r50e16RUE:34575CJoellCr954096577371GlThe Surgery Center In61961610917WorthvilS1679160163Georgann 39H351995706219850KendrickCaleen 66w454409Craige Cot6978Caleen 16176164164Ferna40484 508 069Gerilyn Pilgri2261m644B40940834Allianceheal63m1616109048161092118541610Michele McalpiKatrinka Blazin578WUJ0Annam16arEveraFreeport-McMoRan Cop782958295Beat56ri96Thief Ri161Dudl3Malachy8714454016M26alTheo238132344616109816161604571-240-287-491611657098WUL16183069161Marga7r39e16RUE:56619JoellCr954096573950GlSurgcente1961610912Morse BluS1696119163Georgann 72H531995756219550KendrickCaleen 26w(260454409Craige Cot5778Caleen 1664164728Ferna40279-134-619Gerilyn Pilgri1037m465Fa4093989[facility]54m16101610904435161092131541610Michele McalpiKatrinka Blazin578WUJ0Annam91arEveraFreeport-McMoRan Cop788295Beat14ri96Ye161Dudl3Malachy2839454016M12alTheo24013276516109816161604530-256-496-521611635098WUL16120669161Marga79r80e16RUE:623492JoellCr954096574738GlVa Ann [facility]bor HC1961610913Palo CedS16401841642(9Georgann 23H321995696219830KendrickCaleen 63w61454409Craige Cot7178Caleen 163416672Ferna40(670)114-409Gerilyn Pilgri5645m417Ga40986858[facility] Sur69m16161090451216109212541610Michele McalpiKatrinka Blazin578WUJ0Annam54arEveraFreeport-McMoRan Cop78298295Beat71ri96Lowe161Dudl6Malachy156454016M71alTheo212132714016109816161604555-(629)513-791611636098WUL16157169161Marga73r84e16RUE62Br631eJoellCr954096574836GlMayaguez1961610914San MarcS165916216772Georgann 44H501995166219210KendrickCaleen 62w(76454409Craige Cot3678Caleen 161161524Ferna40804-247-889Gerilyn Pilgri476304m40970843[place] Med57m16101610904664161092151541610Michele McalpiKatrinka Blazin578WUJ0Annam20arEveraFreeport-McMoRan Cop78298295Beat39ri96Is161Dudl64Malachy870454016M67alTheo26613270416109816161604525-517205521611629098WUL16151869161Marga31r64e16RU4236EJoellCr954096572674GlSt Ant631961610919Lake WazeecS161613016543Georgann 30H451995346219300KendrickCaleen 40w41454409Craige Cot2078Caleen 168162964Ferna40419-223-779Gerilyn Pilgri8761340921812[facility] Hospi73m1610161090442116109214541610Michele McalpiKatrinka Blazin578WUJ0Annam31arEveraFreeport-McMoRan Cop7828295Beat63ri96Linds161Dudl78Malachy163454016M67alTheo25213293716109816161604550-317761691611630098WUL16143469161Marga25r49e16R1586UJoellCr95409657241GlTri C1961610914FinzS168414416864Georgann 85H36199526621920KendrickCaleen 40w(83454409Craige Cot4178Caleen 166351617Ferna40575-290-159Gerilyn Pilgr19i840969850[facility]39m1610161090487161092128541610Michele McalpiKatrinka Blazin578WUJ0Annam14arEveraFreeport-McMoRan Cop782958295Beat42ri96Mou161Dudl15Malachy493454016M26alTheo263132685616109816161604560-206-114-721611653098WUL16197969161Marga74r32e16RUE9301:JoellCr954096572961GlEye Surgery An751UnderhRidge Wo1961610913MifflinvilS1625139163Georgann 47H11995226219820KendrickCaleen 89w(23454409Craige Cot4278Caleen 16191642104Ferna40(458) 157-109Gerilyn Pilgri671440965845Hamilton86m1610551610904516161092188541610Michele McalpiKatrinka Blazin578WUJ0Annam31arEveraFreeport-McMoRan Cop7828295Beat103ri96T161Dudl75Malachy3784454016M30alTheo22913270681610981616160452-417818811611670098WUL161(41869161Marga69r6e16RUE:72407JoellCr954096578072GlSlidell Mem361961610918Hopewell JunctiS167117316327Georgann 49H891995796219500KendrickCaleen 75w(91454409Craige Cot9178Caleen 16974164017Ferna40(775) 146-899Gerilyn Pilgri5735540925810[facility] H15m1610391610904485161092128541610Michele McalpiKatrinka Blazin578WUJ0Annam63arEveraFreeport-McMoRan Cop782958295Beat60ri9161Dudl26Malachy9262454016M52alTheo290132547016109816161604570-610-759-061611622098WUL16173769161Marga51r26e16RUE:10752JoellCr954096578059GlG[facility] 3511961610912CottlevilS1653124163Georgann 39H851995506219470KendrickCaleen 62w78454409Craige Cot7878Caleen 16750167219Ferna40(567) 727-809Gerilyn Pilgri666540926820[facility] Hil63m16101610904637161092148541610Michele McalpiKatrinka Blazin578WUJ0Annam13arEveraFreeport-McMoRan Cop7829558295Beat59ri96K161Dudl50Malachy7477454016M52alTheo2113250661610981616160458-(423)691-781611668098WUL161(93969161Marga48r43e16RUE:8512JoellCr9540965779671961610913EboS164517416384Georgann 41H331995936219500KendrickCaleen 60w50454409Craige Cot6578Caleen 16770166236Ferna40587673149Gerilyn Pilgri166m4Ne61w 4053989[facility] S43m1610161090446161092124541610Michele McalpiKatrinka Blazin578WUJ0Annam42arEveraFreeport-McMoRan Cop78298295Beat85ri96N161Dudl41Malachy62454016M44alTheo258132487716109816161604517-470-662-901611665098WUL16181369161Marga58r27e16RUE:7L2161aJoellCr954096575429GlTr41961610916TriangS167412116795Georgann 16H601995856219600KendrickCaleen 46w47454409Craige Cot8778Caleen 1676016643Ferna40872-628-459Gerilyn Pilgri64744097815[place] Surger48m161071610904735161092121541610Michele McalpiKatrinka Blazin578WUJ0Annam90arEveraFreeport-McMoRan Cop7829558295Beat9ri96B161Dudl61Malachy8793454016M40alTheo26613267916109816161604560-605-568-121611672098WUL16171469161Marga24r49e16RUE:69Mo7260uJoellCr954096573858GlDom1961610911[place] LakS16581171647(5Georgann 65H701995616219130KendrickCaleen 41w454409Craige Cot2078Caleen 1614716756Ferna40703 421 979Gerilyn Pilgri27847040960870[facility]54m16101610904503161092165541610Michele McalpiKatrinka Blazin578WUJ0Annam78arEveraFreeport-McMoRan Cop782958295Beat1ri161Dud77Malachy712454016M53alTheo23132523816109816161604564-(303) 838-951611679098WUL161(812)69161Marga46r37e16RUE:76424WJoellCr95409657974GlRehabilitation Hospital1961610911TulaS16711501633(3Georgann 1H261995736219560KendrickCaleen 62w61454409Craige Cot6178Caleen 16316549Ferna40484268729Gerilyn Pilgri393655m40966841Hamilton Coun52m1611610904153161092123541610Michele McalpiKatrinka Blazin578WUJ0Annam77arEveraFreeport-McMoRan Cop782955438295Beat69ri96East 161Dudl3Malachy3664454016M65alTheo24313263416109816161604556-(520)663-621611631098WUL16167869161Marga107r85e16RUE:57Ja6541cJoellCr954096574444GlH[facility] 332Okl196161091Country Life AcrS165215916202Georgann 35H281995566219890KendrickCaleen 62w(64454409Craige Cot1178Caleen 16631165769Ferna40857-395-449Gerilyn Pilgr45i740949817[facility]67m161072161090412161092143541610Michele McalpiKatrinka Blazin578WUJ0Annam39arEveraFreeport-McMoRan Cop78298295Beat30ri96Patto161Dudl36Malachy4409454016M5alTheo259132701316109816161604573-712-081-321611653098WUL16135169161Marga72r25e16RUE:40Ne2400wJoellCr95409657672GlSunset Ridge S1961610919LitchfieS164812716825Georgann 28H951995286219610KendrickCaleen 1w41454409Craige Cot4378Caleen 16140166224Ferna40(904) 536-809Gerilyn Pilgri387m40988826[facility] 41m161610904518161092112541610Michele McalpiKatrinka Blazin578WUJ0Annam50arEveraFreeport-McMoRan Cop7829558295Beat70ri96D161Dudl82Malachy237454016M68alTheo269132195816109816161604551-305-849-141611681098WUL161669161Marga1r90e16RUE:83North Acomi1729tJoellCr954096578049GlBallard Rehab693St1961610911MorganvilS166515216503Georgann 38H841995816219580KendrickCaleen 24w78454409Craige Cot3578Caleen 167166665Ferna40307-183-109Gerilyn Pilgri7134m424D40920868Emory43m16101610904684161092170541610Michele McalpiKatrinka Blazin578WUJ0Annam40arEveraFreeport-McMoRan Cop78298295Beat38ri96161Dudl8Malachy2640454016M66alTheo280132275516109816161604577-314 825 151611630098WUL16190269161Marga25r56e16RUE:865KJoellCr95409657366GlRegency Hospit41Gross1961610919Stinson BeaS168016616753Georgann 16H57199537621910KendrickCaleen 61w454409Craige Cot1278Caleen 16665164739Ferna40209602459Gerilyn Pilgri5754m4Graymoo72r-40976853[facility] 104m1610641610904308161092171541610Michele McalpiKatrinka Blazin578WUJ0Annam49arEveraFreeport-McMoRan Cop78298295Beat37ri96For161Dudl8Malachy1043454016M43alTheo227132645016109816161604580-(210) 204-271611636098WUL16164669161Marga4r41e16RUE:31Springwa7647tJoellCr954096576643GlLima Memorial582B1961610914NorthamptS164817216283Georgann 41H591995596219780KendrickCaleen 31w(708454409Craige Cot3578Caleen 1622167636Ferna40906-377-209Gerilyn Pilgri4647140945868[facility]73m1610101610904564161092151541610Michele McalpiKatrinka Blazin578WUJ0Annam41arEveraFreeport-McMoRan Cop78298295Beat58ri96Jam161Dudl33Malachy4488454016M12alTheo279132316109816161604521-(431) 758-721611635098WUL161(78169161Marga54r60e16RUE:5Mi7341sJoellCr95409657693GlVa Medical CentWi1961610916ImlS16421516167Georgann 46H551995306219770KendrickCaleen 74w71454409Craige Cot8478Caleen 1674164571Ferna40873-626-699Gerilyn Pilgri8682m477Ba40985858[facility] Spr74m16101610904114161092166541610Michele McalpiKatrinka Blazin578WUJ0Annam23arEveraFreeport-McMoRan Cop8295Beat79ri96161Dudl39Malachy2753454016M107alTheo260132762016109816161604552-(959)331-68161163098WUL16195969161Marga21r60e16R4784UJoellCr95409657529GlVirgin[facility] Ambulatory 685Gree1961610915MillingpoS16231691630(8Georgann 72H521995416219510KendrickCaleen 56w(40454409Craige Cot7678Caleen 16197162019Ferna40(934)847-789Gerilyn Pilgri36496840940854[facility]68m16101610904853161092159541610Michele McalpiKatrinka Blazin578WUJ0Annam72arEveraFreeport-McMoRan Cop788295Beat28ri96M161Dudl4Malachy6354454016M39alTheo275132823616109816161604549-772-471-291611638098WUL16120569161Marga44r29e16RUE379:JoellCr954096577160GlNorth 210ColleFa1961610913Susan MooS1646168168Georgann 43H481995596219780KendrickCaleen 83w98454409Craige Cot4278Caleen 164167143Ferna40(815) 372-119Gerilyn Pilgri5913m4South 87Gl40925842[facility] 97m161071610904571161092161541610Michele McalpiKatrinka Blazin578WUJ0Annam17arEveraFreeport-McMoRan Cop7828295Beat69ri96Mou161Dudl78Malachy2403454016M67alTheo269132568116109816161604539-(847)545-781611675098WUL16160569161Marga41r31e16R425UJoellCr95409657456GlBaptist Health Medical CenCr1961610914PerS1672110163Georgann 51H61995626219660KendrickCaleen 14w454409Craige Cot6578Caleen 1615167858Ferna40(684) 003-639Gerilyn Pilgri3070240952833[facility]i64c(519)273Jef043438.214541610Michele McalpiKatrinka Blazin5960161DudlMalachy539454016M62alTheo258132525516109816193-18161161098WULor69161Marga56r28e16RUE:76286JoellCr95409657GlGarrett County Mem2249w152S3348h5719149340Kendrick 62w454409Craige Cot33782162514716686104030985664140409827Ascension19m161610904312161092159541610Michele McalpiKatrinka Blazin578WUJ0Annam39arEveraFreeport-McMoRan Cop782958295Beat28ri96B161Dudl7Malachy8734454016M12alTheo274132798116109816161604559-(703) 467-1516116100098WUL161(71769161Marga20r16e16RUE466:JoellCr9540965787422H196161091NanuS166514516384Georgann 74H31995786219780KendrickCaleen 76w74454409Craige Cot7578Caleen 16133164133Ferna40540-039-949Gerilyn Pilgri3664m4W10es40988830Gastroenterology Ass11m1610221610904356161092180541610Michele McalpiKatrinka Blazin578WUJ0Annam14arEveraFreeport-McMoRan Cop78295578295Beat30ri96161Dudl6Malachy3753454016M41alTheo2661325441610981616160458-337165431611660098WUL16197169161Marga29r56e16RUE:718425JoellCr954096578977GlKing'S1961610911Vernon HilS165015616229Georgann 90H241995616219570KendrickCaleen 50w67454409Craige Cot7278Caleen 1102166939Ferna40639-452-109Gerilyn Pilgri655240917812Hopebrid79m1610161090433161092143541610Michele McalpiKatrinka Blazin578WUJ0Annam51arEveraFreeport-McMoRan Cop782958295Beat3ri161Dudl22Malachy6646454016M36alTheo2113264616109816161604586-910-077-311611610098WUL16174369161Marga36r7e53826JoellCr954096573782GlPromise Hos1961610917LansiS163916116693Georgann 54H861995286219480KendrickCaleen 54w454409Craige Cot4778Caleen 167168086Ferna40(402) 265-109Gerilyn Pilgri5697540942874[facility]2m16101610904258161092139541610Michele McalpiKatrinka Blazin578WUJ0Annam75arEveraFreeport-McMoRan Cop78295528295Beat74r161Dud53Malachy4972454016M22alTheo23613267616109816161604542-(254)368-121611638098WUL161(320)69161Marga29r24e16RUE:72B424rJoellCr954096574153GlTrinity Me61961610915WoodbiS16331421654(60Georgann 25H63199538621940KendrickCaleen 45w77454409Craige Cot1978Caleen 16684165985Ferna40(631)117-669Gerilyn Pilgri3025m4Velda Vi18ll40927836[facility]50m161016109041105161092148541610Michele McalpiKatrinka Blazin578WUJ0Annam50arEveraFreeport-McMoRan Cop7829558295Beat96ri96Ap161Dudl6Malachy444454016M10alTheo265132357616109816161604545-(802)655-741611637098WUL16157069161Marga59r43e16RUE:60F290oJoellCr954096577475GlEastern [place] Re36We196161091BooS16491916522Georgann 50H11995286219430KendrickCaleen 34w57454409Craige Cot6578Caleen 1635164932Ferna40519-450-689Gerilyn Pilgri4365540982840Yuma Endos53m16101610904705161092127541610Michele McalpiKatrinka Blazin578WUJ0Annam14arEveraFreeport-McMoRan Cop78298295Beat26ri96161Dudl70Malachy8253454016M73alTheo218132727016109816161604576-(909) 736-361611639098WUL161269161Marga24r26e16RUE:W7222iJoellCr954096574725GlWestside Out1961610915ClaytS162714916722Georgann 28H74199552621940KendrickCaleen 64w85454409Craige Cot2378Caleen 165216625Ferna40231339599Gerilyn Pilgri4451m4Hu69be40939827[facility]63m16101610904583161092152541610Michele McalpiKatrinka Blazin578WUJ0Annam24arEveraFreeport-McMoRan Cop7829558295Beat46ri96C161Dudl41Malachy36454016M77alTheo257132425216109816161604525-928-605-181611650098WUL161469161Marga71r32e16RUE:31C2609oJoellCr954096575934GlSentara 453We1961610911Pecan GS16701251628Georgann 40H481995626219580KendrickCaleen 7w(68454409Craige Cot5178Caleen 16338161942Ferna40(313)854-649Gerilyn Pilgri596578m440967857[facility]78m16101610904638161092150541610Michele McalpiKatrinka Blazin578WUJ0Annam72arEveraFreeport-McMoRan Cop7829559418295Beat82r161DudlMalachy209454016M72alTheo246132414016109816161604533-740-029-051611633098WUL161269161Marga50r24e16RUE:1Gr566aJoellCr954096578161GlHackensack University 61961610917Tell[place] VillaS16761371672(2Georgann 63H901995796219790KendrickCaleen 56w46454409Craige Cot3178Caleen 168651660109Ferna40458-467-639Gerilyn Pilgri8172m4Dobb14in40947839[facility]53m1610551610904864161092159541610Michele McalpiKatrinka Blazin578WUJ0Annam108arEveraFreeport-McMoRan Cop782958295Beat53ri96161Dudl39Malachy329454016M59alTheo218132604116109816161604519-(330)097-801611665098WUL16174769161Marga56r56e16RUE:36Whitema131rJoellCr954096578080GlPresbyterian Medical Group Doctor [facility] Mem596MoN1961610918MillstaS164716016537Georgann 72H75199563621930KendrickCaleen 39w(91454409Craige Cot6278Caleen 1645116747Ferna40616-418-349Gerilyn Pilgri515285m40982831[facility] Mal12-1610Gea669-628-1610170614098161166960(317[place]) 

## 2016-04-11 NOTE — Assessment & Plan Note (Addendum)
Regularly takes 1-3 klonopin daily. Will need to review and update controlled substance agreement with patient next visit.

## 2016-04-11 NOTE — Patient Instructions (Addendum)
Flu shot today. Double check with Dr Lady GaryFath about antibiotics prior to dental procedures. You are doing well today. Nice to meet you today. Return as needed or in 4 months for medicare wellness visit.

## 2016-04-11 NOTE — Assessment & Plan Note (Signed)
Appreciate urology care.  ?

## 2016-04-11 NOTE — Assessment & Plan Note (Signed)
Followed by pain management s/p cervical surgery.

## 2016-04-11 NOTE — Progress Notes (Signed)
Pre visit review using our clinic review tool, if applicable. No additional management support is needed unless otherwise documented below in the visit note. 

## 2016-04-11 NOTE — Assessment & Plan Note (Signed)
Chronic, stable. Continue lipitor which was recently started by cardiology.

## 2016-04-17 ENCOUNTER — Encounter: Payer: Self-pay | Admitting: Family Medicine

## 2016-04-27 ENCOUNTER — Telehealth: Payer: Self-pay | Admitting: Internal Medicine

## 2016-04-27 MED ORDER — CLONAZEPAM 1 MG PO TABS: ORAL_TABLET | ORAL | 0 refills | Status: DC

## 2016-04-27 MED ORDER — FLUTICASONE PROPIONATE 50 MCG/ACT NA SUSP: 2.0000 | Freq: Every day | NASAL | 1 refills | Status: DC

## 2016-04-27 MED ORDER — NAPROXEN 500 MG PO TABS: ORAL_TABLET | ORAL | 0 refills | Status: DC

## 2016-04-27 NOTE — Telephone Encounter (Signed)
plz notify - he is early on klonopin as he had #270 filled on 02/17/2016. If taking 3 every day, won't be due until 05/18/2016. I understand this is through mail order so have printed out but fill date would be 05/18/2016 (in Kim's box). Ensure he is not running out early. Will further discuss at next OV.  Rest of meds refilled.

## 2016-04-27 NOTE — Telephone Encounter (Signed)
[facility] is calling to get a refill for naproxen (NAPROSYN) 500 MG tablet, clonazePAM (KLONOPIN) 1 MG tablet and fluticasone (FLONASE) 50 MCG/ACT nasal spray.  Thank you!  [facility] [place] - [address]

## 2016-04-27 NOTE — Telephone Encounter (Signed)
Patient called in for a refill on medications below, Last ov was [facility], Please advise for his refills, thanks

## 2016-04-28 NOTE — Telephone Encounter (Signed)
Message left for patient to return my call. Rx faxed to mail order.

## 2016-04-28 NOTE — Telephone Encounter (Signed)
Message left for patient to return my call.  

## 2016-04-28 NOTE — Telephone Encounter (Signed)
Spoke with patient. He said he isn't running out early. He just knew he would need a refill coming up and just wanted to go ahead and request it instead of requesting multiple refills over multiple days. He doesn't take med everyday and is good until refill arrives.

## 2016-04-28 NOTE — Telephone Encounter (Signed)
Pt returned your call - please call back thank you  °

## 2016-05-09 ENCOUNTER — Ambulatory Visit: Payer: Medicare Other | Admitting: Internal Medicine

## 2016-05-15 ENCOUNTER — Encounter: Payer: Self-pay | Admitting: Pain Medicine

## 2016-05-15 ENCOUNTER — Ambulatory Visit: Payer: Medicare Other | Attending: Pain Medicine | Admitting: Pain Medicine

## 2016-05-15 VITALS — BP 129/66 | HR 79 | Temp 97.9°F | Resp 18 | Ht 68.0 in | Wt 224.0 lb

## 2016-05-15 DIAGNOSIS — E669 Obesity, unspecified: Secondary | ICD-10-CM | POA: Insufficient documentation

## 2016-05-15 DIAGNOSIS — G8929 Other chronic pain: Secondary | ICD-10-CM

## 2016-05-15 DIAGNOSIS — F1190 Opioid use, unspecified, uncomplicated: Secondary | ICD-10-CM | POA: Diagnosis not present

## 2016-05-15 DIAGNOSIS — Z880 Allergy status to penicillin: Secondary | ICD-10-CM | POA: Insufficient documentation

## 2016-05-15 DIAGNOSIS — Z79899 Other long term (current) drug therapy: Secondary | ICD-10-CM | POA: Insufficient documentation

## 2016-05-15 DIAGNOSIS — G894 Chronic pain syndrome: Secondary | ICD-10-CM | POA: Diagnosis not present

## 2016-05-15 DIAGNOSIS — F339 Major depressive disorder, recurrent, unspecified: Secondary | ICD-10-CM | POA: Diagnosis not present

## 2016-05-15 DIAGNOSIS — Z7982 Long term (current) use of aspirin: Secondary | ICD-10-CM | POA: Diagnosis not present

## 2016-05-15 DIAGNOSIS — M25561 Pain in right knee: Secondary | ICD-10-CM

## 2016-05-15 DIAGNOSIS — M170 Bilateral primary osteoarthritis of knee: Secondary | ICD-10-CM | POA: Diagnosis not present

## 2016-05-15 DIAGNOSIS — Z950 Presence of cardiac pacemaker: Secondary | ICD-10-CM | POA: Diagnosis not present

## 2016-05-15 DIAGNOSIS — M779 Enthesopathy, unspecified: Secondary | ICD-10-CM | POA: Insufficient documentation

## 2016-05-15 DIAGNOSIS — F411 Generalized anxiety disorder: Secondary | ICD-10-CM | POA: Diagnosis not present

## 2016-05-15 DIAGNOSIS — G4733 Obstructive sleep apnea (adult) (pediatric): Secondary | ICD-10-CM | POA: Diagnosis not present

## 2016-05-15 DIAGNOSIS — D696 Thrombocytopenia, unspecified: Secondary | ICD-10-CM | POA: Insufficient documentation

## 2016-05-15 DIAGNOSIS — E785 Hyperlipidemia, unspecified: Secondary | ICD-10-CM | POA: Diagnosis not present

## 2016-05-15 DIAGNOSIS — Z79891 Long term (current) use of opiate analgesic: Secondary | ICD-10-CM | POA: Diagnosis not present

## 2016-05-15 DIAGNOSIS — Z6834 Body mass index (BMI) 34.0-34.9, adult: Secondary | ICD-10-CM | POA: Diagnosis not present

## 2016-05-15 DIAGNOSIS — M25562 Pain in left knee: Secondary | ICD-10-CM

## 2016-05-15 DIAGNOSIS — M778 Other enthesopathies, not elsewhere classified: Secondary | ICD-10-CM

## 2016-05-15 MED ORDER — OXYCODONE HCL ER 10 MG PO T12A: 10.0000 mg | EXTENDED_RELEASE_TABLET | Freq: Two times a day (BID) | ORAL | 0 refills | Status: DC

## 2016-05-15 MED ORDER — OXYCODONE HCL 5 MG PO TABS: 5.0000 mg | ORAL_TABLET | Freq: Four times a day (QID) | ORAL | 0 refills | Status: DC | PRN

## 2016-05-15 NOTE — Progress Notes (Signed)
Safety precautions to be maintained throughout the outpatient stay will include: orient to surroundings, keep bed in low position, maintain call bell within reach at all times, provide assistance with transfer out of bed and ambulation.  

## 2016-05-15 NOTE — Progress Notes (Signed)
Bottle labeled oxycodone 5 mg  #43/120 last filled 04/29/16  Bottle labeled oxycontin 10 mg #49/60 last filled 04/29/16

## 2016-05-15 NOTE — Progress Notes (Signed)
Patient's Name: Mark [name]  MRN: [id number]  Referring Provider: Shelia Media, MD  DOB: 04/03/1956  PCP: [name], MD  DOS: 05/15/2016  Note by: Sydnee Levans. Laban Emperor, MD  Service setting: Ambulatory outpatient  Specialty: Interventional Pain Management  Location: [facility] (AMB) Pain Management Facility    Patient type: Established   Primary Reason(s) for Visit: Encounter for prescription drug management (Level of risk: moderate) CC: Neck Pain; Shoulder Pain; and Knee Pain  HPI  Mark [name] is a 60 y.o. year old, male patient, who comes today for an initial evaluation. He has Chronic pain syndrome; MDD (major depressive disorder), recurrent episode, moderate (HCC); Insomnia; GAD (generalized anxiety disorder); Medicare annual wellness visit, subsequent; Hyperlipidemia; Thrombocytopenia (HCC); Obesity, Class II, BMI 35-39.9, with comorbidity (HCC); Routine general medical examination at a health care facility; Hydrocele in adult; Candidal dermatitis; Benign prostatic hyperplasia with urinary obstruction; Nephrolithiasis; Bradycardia; Cardiac murmur; Opiate use (60 MME/Day); Encounter for therapeutic drug level monitoring; Chronic neck pain (Location of Primary Source of Pain) (Bilateral) (L>R); Chronic shoulder pain (Bilateral) (L>R); Chronic knee pain (Bilateral) (R>L); History of permanent cardiac pacemaker placement; Opioid-induced constipation (OIC); Radicular pain of shoulder (Bilateral) (L>R); 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); Cervicogenic headache; Cervical spondylosis with radiculopathy (Bilateral) (L>R); Cervical facet syndrome (Bilateral) (L>R); Cervical foraminal stenosis (Severe C6-7) (Left); Chronic sacroiliac joint pain (Bilateral); Osteoarthritis of sacroiliac joint (Bilateral); Lumbar facet hypertrophy (L1-2, L2-3, and L4-5) (Bilateral); Lumbar IVDD (intervertebral disc displacement); 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); Lumbar facet syndrome  (Bilateral); Osteoarthritis of shoulder (Left); Osteoarthritis of knee (Bilateral) (R>L); Obstructive sleep apnea; Hypocitraturia; Long term current use of opiate analgesic; Long term prescription opiate use; Cardiac pacemaker; and Tendinitis of extensor tendon of right hand on his problem list.. His primarily concern today is the Neck Pain; Shoulder Pain; and Knee Pain  Pain Assessment: Self-Reported Pain Score: 3  ( )/10             Reported level is compatible with observation.       Pain Type: Chronic pain Pain Location: Neck (right knee pain. right hand pain. ) Pain Orientation: Lower Pain Descriptors / Indicators: Aching, Sharp, Shooting Pain Frequency: Constant  The patient comes into the clinics today for pharmacological management of his chronic pain. I last saw this patient on 02/02/2016. The patient  reports that he does not use drugs. His body mass index is 34.06 kg/m.  Date of Last Visit: 02/02/16 Service Provided on Last Visit: Med Refill  Controlled Substance Pharmacotherapy Assessment & REMS (Risk Evaluation and Mitigation Strategy)  Analgesic: Oxycodone ER 10 mg every 12 hours (20 mg/day) + oxycodone IR 5 mg every 6 hours (20 mg/day) MME/day: 60 mg/day.  Pill Count: Bottle labeled oxycodone 5 mg  #43/120 last filled 04/29/16. Bottle labeled oxycontin 10 mg #49/60 last filled 04/29/16. Pharmacokinetics: Onset of action (Liberation/Absorption): Within expected pharmacological parameters Time to Peak effect (Distribution): Timing and results are as within normal expected parameters Duration of action (Metabolism/Excretion): Within normal limits for medication Pharmacodynamics: Analgesic Effect: More than 50% Activity Facilitation: Medication(s) allow patient to sit, stand, walk, and do the basic ADLs Perceived Effectiveness: Described as relatively effective, allowing for increase in activities of daily living (ADL) Side-effects or Adverse reactions: None  reported Monitoring: [place] PMP: Online review of the past 88-month period conducted. Compliant with practice rules and regulations List of all UDS test(s) done:  Lab Results  Component Value Date   TOXASSSELUR FINAL 02/02/2016   TOXASSSELUR FINAL  11/29/2015   TOXASSSELUR FINAL 09/01/2015   Last UDS on record: ToxAssure Select 13  Date Value Ref Range Status  02/02/2016 FINAL  Final    Comment:    ==================================================================== TOXASSURE SELECT 13 (MW) ==================================================================== Test                             Result       Flag       Units Drug Present and Declared for Prescription Verification   7-aminoclonazepam              559          EXPECTED   ng/mg creat    7-aminoclonazepam is an expected metabolite of clonazepam. Source    of clonazepam is a scheduled prescription medication.   Oxycodone                      4026         EXPECTED   ng/mg creat   Oxymorphone                    103          EXPECTED   ng/mg creat   Noroxycodone                   4428         EXPECTED   ng/mg creat    Sources of oxycodone include scheduled prescription medications.    Oxymorphone and noroxycodone are expected metabolites of    oxycodone. Oxymorphone is also available as a scheduled    prescription medication. ==================================================================== Test                      Result    Flag   Units      Ref Range   Creatinine              58               mg/dL      >=16 ==================================================================== Declared Medications:  The flagging and interpretation on this report are based on the  following declared medications.  Unexpected results may arise from  inaccuracies in the declared medications.  **Note: The testing scope of this panel includes these medications:  Clonazepam (Klonopin)  Oxycodone  **Note: The testing scope of this panel does not  include following  reported medications:  Aspirin (Aspirin 81)  Atorvastatin (Lipitor)  Cetirizine  Cholecalciferol  Clindamycin (Cleocin)  Desonide  Fluoxetine (Prozac)  Fluticasone (Flonase)  Furosemide (Lasix)  Multivitamin  Naloxone  Naproxen  Omega-3 Fatty Acids (Fish Oil)  Omeprazole  Sildenafil  Supplement  Supplement (Saw Palmetto) ==================================================================== For clinical consultation, please call [phone]. ====================================================================    UDS interpretation: Compliant          Medication Assessment Form: Reviewed. Patient indicates being compliant with therapy Treatment compliance: Compliant Risk Assessment: Aberrant Behavior: None observed today Substance Use Disorder (SUD) Risk Level: Low-to-moderate Risk of opioid abuse or dependence: 0.7-3.0% with doses ? 36 MME/day and 6.1-26% with doses ? 120 MME/day. Opioid Risk Tool (ORT) Score: 0   Low Risk for SUD (Score <3) Depression Scale Score: PHQ-2: 0   No depression (0) PHQ-9: 0   No depression (0-4)  Pharmacologic Plan: No change in therapy, at this time  Laboratory Chemistry  Inflammation Markers Lab Results  Component Value Date   ESRSEDRATE  6 09/01/2015   CRP 1.7 (H) 09/01/2015   Renal Function Lab Results  Component Value Date   BUN 24 (H) 12/29/2015   CREATININE 1.01 12/29/2015   GFRAA >60 09/01/2015   GFRNONAA >60 09/01/2015   Hepatic Function Lab Results  Component Value Date   AST 24 12/29/2015   ALT 23 12/29/2015   ALBUMIN 4.2 12/29/2015   Electrolytes Lab Results  Component Value Date   NA 140 12/29/2015   K 3.8 12/29/2015   CL 104 12/29/2015   CALCIUM 8.9 12/29/2015   MG 2.1 09/01/2015   Pain Modulating Vitamins Lab Results  Component Value Date   25OHVITD1 63 02/02/2016   25OHVITD2 <1.0 02/02/2016   25OHVITD3 63 02/02/2016   VITAMINB12 861 02/02/2016   Coagulation Parameters Lab  Results  Component Value Date   INR 1.0 06/03/2007   LABPROT 13.7 06/03/2007   APTT 29 06/03/2007   PLT 172.0 05/07/2015   Cardiovascular Lab Results  Component Value Date   HGB 15.1 05/07/2015   HCT 44.2 05/07/2015   Note: Lab results reviewed. Recent Diagnostic Imaging  No results found. Meds  The patient has a current medication list which includes the following prescription(s): aspirin, atorvastatin, cetirizine, vitamin d, clindamycin, clonazepam, cranberry, cranberry-vitamin c-vitamin e, desonide, fluoxetine, fluoxetine, fluticasone, furosemide, garlic, multiple vitamins-minerals, naloxone hcl, naproxen, fish oil, omeprazole, oxybutynin, oxycodone, oxycodone, oxycodone, oxycodone, oxycodone, oxycodone, oxycodone, oxycodone, potassium citrate, saw palmetto, sildenafil, vitamin b complex-c, and benefiber drink mix.  Current Outpatient Prescriptions on File Prior to Visit  Medication Sig  . aspirin 81 MG tablet Take 81 mg by mouth daily.    Marland Kitchen atorvastatin (LIPITOR) 20 MG tablet Take 20 mg by mouth daily.  . cetirizine (ZYRTEC) 10 MG tablet Take 10 mg by mouth daily. As needed  . Cholecalciferol (VITAMIN D) 2000 units CAPS Take 1 capsule by mouth daily.  . clindamycin (CLEOCIN) 150 MG capsule Take 150 mg by mouth daily. 4 caps 1 hour before dental procedures.   . clonazePAM (KLONOPIN) 1 MG tablet TAKE 1 BY MOUTH 3 TIMES DAILY AS NEEDED FOR ANXIETY  . Cranberry 400 MG CAPS Take by mouth.  . desonide (DESOWEN) 0.05 % ointment Apply topically 2 (two) times daily. (Patient taking differently: Apply topically as needed. )  . FLUoxetine (PROZAC) 20 MG capsule TAKE 1 BY MOUTH DAILY  . FLUoxetine (PROZAC) 40 MG capsule TAKE 1 BY MOUTH DAILY  . fluticasone (FLONASE) 50 MCG/ACT nasal spray Place 2 sprays into both nostrils daily.  . furosemide (LASIX) 20 MG tablet TAKE 1 BY MOUTH DAILY  . Garlic 1000 MG CAPS Take 1 capsule by mouth daily.    . Multiple Vitamin (MULTIVITAMIN PO) Take 1 tablet  by mouth daily.    . Naloxone HCl (NARCAN) 4 MG/0.1ML LIQD Spray into one nostril. Repeat with second device into other nostril after 2-3 minutes if no or minimal response.  . naproxen (NAPROSYN) 500 MG tablet TAKE 1 BY MOUTH TWICE DAILY WITH A MEAL  . Omega-3 Fatty Acids (FISH OIL) 1200 MG CAPS Take 2,400 mg by mouth 2 (two) times daily.   Marland Kitchen omeprazole (PRILOSEC) 20 MG capsule TAKE 1 BY MOUTH DAILY  . potassium citrate (UROCIT-K) 10 MEQ (1080 MG) SR tablet Take 10 mEq by mouth 2 (two) times daily.  . Saw Palmetto 450 MG CAPS Take 900 mg by mouth daily.   Marland Kitchen VITAMIN B COMPLEX-C CAPS Take by mouth.   No current facility-administered medications on file prior to visit.  ROS  Constitutional: Denies any fever or chills Gastrointestinal: No reported hemesis, hematochezia, vomiting, or acute GI distress Musculoskeletal: Denies any acute onset joint swelling, redness, loss of ROM, or weakness Neurological: No reported episodes of acute onset apraxia, aphasia, dysarthria, agnosia, amnesia, paralysis, loss of coordination, or loss of consciousness  Allergies  Mr. Zebrowski is allergic to ciprofloxacin; metformin; penicillin v potassium; penicillins; buprenorphine hcl; and morphine and related.  PFSH  Medical:  Mr. Trim  has a past medical history of Anxiety; Chronic pain; Depression; History of kidney stones (April 12,2017); History of permanent cardiac pacemaker placement (05/31/2015); Kidney stones; Migraine; Prostatitis; and Sleep apnea. Family: family history includes Arthritis in his father and mother; Cancer in his father and mother; Diabetes in his mother; Heart block in his father; Heart disease in his father and mother. Surgical:  has a past surgical history that includes Tonsillectomy (1964); Carpal tunnel release (Bilateral, 2006); Ulnar nerve repair (2008); Cervical spine surgery (2008); Rotator cuff repair (Left, 2009); Colonoscopy (07/2007); pacemaker placement (2009); Lithotripsy  (Bilateral, 2012); Lithotripsy (Left, 2017); and Cardiovascular stress test (2014). Tobacco:  reports that he has quit smoking. He has never used smokeless tobacco. Alcohol:  reports that he does not drink alcohol. Drug:  reports that he does not use drugs.  Constitutional Exam  General appearance: Well nourished, well developed, and well hydrated. In no acute distress Vitals:   05/15/16 1341  BP: 129/66  Pulse: 79  Resp: 18  Temp: 97.9 F (36.6 C)  SpO2: 99%  Weight: 224 lb (101.6 kg)  Height: 5\' 8"  (1.727 m)  BMI Assessment: Estimated body mass index is 34.06 kg/m as calculated from the following:   Height as of this encounter: 5\' 8"  (1.727 m).   Weight as of this encounter: 224 lb (101.6 kg).   BMI interpretation: (30-34.9 kg/m2) = Obese (Class I): This range is associated with a 68% higher incidence of chronic pain. BMI Readings from Last 4 Encounters:  05/15/16 34.06 kg/m  04/11/16 35.16 kg/m  02/02/16 34.06 kg/m  11/29/15 34.06 kg/m   Wt Readings from Last 4 Encounters:  05/15/16 224 lb (101.6 kg)  04/11/16 224 lb 8 oz (101.8 kg)  02/02/16 224 lb (101.6 kg)  11/29/15 224 lb (101.6 kg)  Psych/Mental status: Alert and oriented x 3 (person, place, & time) Eyes: PERLA Respiratory: No evidence of acute respiratory distress  Cervical Spine Exam  Inspection: No masses, redness, or swelling Alignment: Symmetrical Functional ROM: Unrestricted ROM Stability: No instability detected Muscle strength & Tone: Functionally intact Sensory: Unimpaired Palpation: Non-contributory  Upper Extremity (UE) Exam    Side: Right upper extremity  Side: Left upper extremity  Inspection: No masses, redness, swelling, or asymmetry  Inspection: No masses, redness, swelling, or asymmetry  Functional ROM: Unrestricted ROM         Functional ROM: Unrestricted ROM          Muscle strength & Tone: Functionally intact  Muscle strength & Tone: Functionally intact  Sensory: Movement-associated  discomfort of the flexor tendons of the thumb.   Sensory: Unimpaired  Palpation: Tender  Palpation: Non-contributory   Thoracic Spine Exam  Inspection: No masses, redness, or swelling Alignment: Symmetrical Functional ROM: Unrestricted ROM Stability: No instability detected Sensory: Unimpaired Muscle strength & Tone: Functionally intact Palpation: Non-contributory  Lumbar Spine Exam  Inspection: No masses, redness, or swelling Alignment: Symmetrical Functional ROM: Unrestricted ROM Stability: No instability detected Muscle strength & Tone: Functionally intact Sensory: Unimpaired Palpation: Non-contributory Provocative Tests: Lumbar Hyperextension and  rotation test: evaluation deferred today       Patrick's Maneuver: evaluation deferred today              Gait & Posture Assessment  Ambulation: Unassisted Gait: Relatively normal for age and body habitus Posture: WNL   Lower Extremity Exam    Side: Right lower extremity  Side: Left lower extremity  Inspection: No masses, redness, swelling, or asymmetry  Inspection: No masses, redness, swelling, or asymmetry  Functional ROM: Diminished ROM for knee joint  Functional ROM: Diminished ROM for knee joint  Muscle strength & Tone: Functionally intact  Muscle strength & Tone: Functionally intact  Sensory: Movement-associated discomfort  Sensory: Movement-associated discomfort  Palpation: Tender  Palpation: Tender   Assessment  Primary Diagnosis & Pertinent Problem List: The primary encounter diagnosis was Chronic pain syndrome. Diagnoses of Opiate use (60 MME/Day), Long term current use of opiate analgesic, Long term prescription opiate use, Primary osteoarthritis of both knees, Chronic knee pain (Bilateral) (R>L), and Tendinitis of extensor tendon of right hand were also pertinent to this visit.  Visit Diagnosis: 1. Chronic pain syndrome   2. Opiate use (60 MME/Day)   3. Long term current use of opiate analgesic   4. Long term  prescription opiate use   5. Primary osteoarthritis of both knees   6. Chronic knee pain (Bilateral) (R>L)   7. Tendinitis of extensor tendon of right hand    Plan of Care  Pharmacotherapy (Medications Ordered): Meds ordered this encounter  Medications  . oxyCODONE (OXYCONTIN) 10 mg 12 hr tablet    Sig: Take 1 tablet (10 mg total) by mouth every 12 (twelve) hours.    Dispense:  60 tablet    Refill:  0    Do not add this medication to the electronic "Automatic Refill" notification system. Patient may have prescription filled one day early if pharmacy is closed on scheduled refill date. Do not fill until: 05/29/16 To last until: 06/28/16  . oxyCODONE (OXY IR/ROXICODONE) 5 MG immediate release tablet    Sig: Take 1 tablet (5 mg total) by mouth every 6 (six) hours as needed for severe pain.    Dispense:  120 tablet    Refill:  0    Do not add this medication to the electronic "Automatic Refill" notification system. Patient may have prescription filled one day early if pharmacy is closed on scheduled refill date. Do not fill until: 05/29/16 To last until: 06/28/16  . oxyCODONE (OXYCONTIN) 10 mg 12 hr tablet    Sig: Take 1 tablet (10 mg total) by mouth every 12 (twelve) hours.    Dispense:  60 tablet    Refill:  0    Do not add this medication to the electronic "Automatic Refill" notification system. Patient may have prescription filled one day early if pharmacy is closed on scheduled refill date. Do not fill until: 06/28/16 To last until: 07/28/16  . oxyCODONE (OXYCONTIN) 10 mg 12 hr tablet    Sig: Take 1 tablet (10 mg total) by mouth every 12 (twelve) hours.    Dispense:  60 tablet    Refill:  0    Do not add this medication to the electronic "Automatic Refill" notification system. Patient may have prescription filled one day early if pharmacy is closed on scheduled refill date. Do not fill until: 07/28/16 To last until: 08/28/15  . oxyCODONE (OXY IR/ROXICODONE) 5 MG immediate  release tablet    Sig: Take 1 tablet (5 mg total) by mouth every  6 (six) hours as needed for severe pain.    Dispense:  120 tablet    Refill:  0    Do not add this medication to the electronic "Automatic Refill" notification system. Patient may have prescription filled one day early if pharmacy is closed on scheduled refill date. Do not fill until: 06/28/16 To last until: 07/28/16  . oxyCODONE (OXY IR/ROXICODONE) 5 MG immediate release tablet    Sig: Take 1 tablet (5 mg total) by mouth every 6 (six) hours as needed for severe pain.    Dispense:  120 tablet    Refill:  0    Do not add this medication to the electronic "Automatic Refill" notification system. Patient may have prescription filled one day early if pharmacy is closed on scheduled refill date. Do not fill until: 07/28/16 To last until: 08/28/15   New Prescriptions   OXYCODONE (OXY IR/ROXICODONE) 5 MG IMMEDIATE RELEASE TABLET    Take 1 tablet (5 mg total) by mouth every 6 (six) hours as needed for severe pain.   OXYCODONE (OXY IR/ROXICODONE) 5 MG IMMEDIATE RELEASE TABLET    Take 1 tablet (5 mg total) by mouth every 6 (six) hours as needed for severe pain.   OXYCODONE (OXYCONTIN) 10 MG 12 HR TABLET    Take 1 tablet (10 mg total) by mouth every 12 (twelve) hours.   OXYCODONE (OXYCONTIN) 10 MG 12 HR TABLET    Take 1 tablet (10 mg total) by mouth every 12 (twelve) hours.   Medications administered during this visit: [name] had no medications administered during this visit. Lab-work, Procedure(s), & Referral(s) Ordered: Orders Placed This Encounter  Procedures  . KNEE INJECTION  . Injection tendon or ligament  . ToxASSURE Select 13 (MW), Urine   Imaging & Referral(s) Ordered: None  Interventional Therapies: Scheduled:   Bilateral intra-articular Hyalgan knee injection series (injection #1)  Right hand flexor tendon of the thumb tendinitis injection.    Considering:   Complete a series of 5 Hyalgan intra-articular knee  injections, bilaterally.  Diagnostic bilateral cervical facet block under fluoroscopic guidance and IV sedation.  Possible bilateral cervical facet radiofrequency ablation under fluoroscopic guidance and IV sedation.  Diagnostic left sided cervical epidural steroid injection under fluoroscopic guidance, with or without sedation.  Diagnostic bilateral intra-articular knee injection, without fluoroscopy or sedation.  Diagnostic bilateral genicular nerve blocks under fluoroscopic guidance, with or without sedation.  Possible bilateral genicular nerve radiofrequency ablation under fluoroscopic guidance and IV sedation.  Diagnostic bilateral sacroiliac joint block under fluoroscopic guidance, with or without sedation.  Possible bilateral sacroiliac joint radiofrequency ablation under fluoroscopic guidance and IV sedation.  Diagnostic bilateral intra-articular shoulder joint injection under fluoroscopic guidance, with or without sedation.  Diagnostic bilateral suprascapular nerve block under fluoroscopic guidance, with a without sedation.  Possible bilateral suprascapular nerve radiofrequency ablation under fluoroscopic guidance and IV sedation.  Diagnostic bilateral lumbar facet block under fluoroscopic guidance and IV sedation.  Possible bilateral lumbar facet radiofrequency ablation under fluoroscopic guidance and IV sedation.  Diagnostic left L4-5 lumbar epidural steroid injection under fluoroscopic guidance, with or without sedation.    PRN Procedures:  Diagnostic bilateral cervical facet block under fluoroscopic guidance and IV sedation.  Diagnostic left sided cervical epidural steroid injection under fluoroscopic guidance, with or without sedation.  Diagnostic bilateral intra-articular knee injection, without fluoroscopy or sedation.  Diagnostic bilateral genicular nerve blocks under fluoroscopic guidance, with or without sedation.  Diagnostic bilateral sacroiliac joint block under fluoroscopic  guidance, with or without sedation.  Diagnostic bilateral intra-articular shoulder joint injection under fluoroscopic guidance, with or without sedation.  Diagnostic bilateral suprascapular nerve block under fluoroscopic guidance, with a without sedation.  Diagnostic bilateral lumbar facet block under fluoroscopic guidance and IV sedation.  Diagnostic left L4-5 lumbar epidural steroid injection under fluoroscopic guidance, with or without sedation.    Requested PM Follow-up: Return in about 3 months (around 08/15/2016) for Med-Mgmt, In addition, Schedule Procedure.  Future Appointments Date Time Provider Department Center  05/23/2016 1:45 PM [name], MD [facility] None  08/03/2016 1:15 PM [name], MD [facility] None  08/04/2016 9:40 AM [facility]  08/04/2016 9:45 AM Robert Bellow, LPN [facility]  08/11/2016 3:00 PM [name], MD [facility]   Primary Care Physician: [name], MD Location: [facility] Note by: Sydnee Levans. Laban Emperor, M.D, DABA, DABAPM, DABPM, DABIPP, FIPP  Pain Score Disclaimer: We use the NRS-11 scale. This is a self-reported, subjective measurement of pain severity with only modest accuracy. It is used primarily to identify changes within a particular patient. It must be understood that outpatient pain scales are significantly less accurate that those used for research, where they can be applied under ideal controlled circumstances with minimal exposure to variables. In reality, the score is likely to be a combination of pain intensity and pain affect, where pain affect describes the degree of emotional arousal or changes in action readiness caused by the sensory experience of pain. Factors such as social and work situation, setting, emotional state, anxiety levels, expectation, and prior pain experience may influence pain perception and show large inter-individual  differences that may also be affected by time variables.  Patient instructions provided during this appointment: Patient Instructions  Sodium Hyaluronate intra-articular injection What is this medicine? SODIUM HYALURONATE (SOE dee um hye al yoor ON ate) is used to treat pain in the knee due to osteoarthritis. This medicine may be used for other purposes; ask your health care provider or pharmacist if you have questions. What should I tell my health care provider before I take this medicine? They need to know if you have any of these conditions: -bleeding disorders -glaucoma -infection in the knee joint -skin conditions or sensitivity -skin infection -an unusual allergic reaction to sodium hyaluronate, other medicines, foods, dyes, or preservatives. Different brands of sodium hyaluronate contain different allergens. Some may contain egg. Talk to your doctor about your allergies to make sure that you get the right product. -pregnant or trying to get pregnant -breast-feeding How should I use this medicine? This medicine is for injection into the knee joint. It is given by a health care professional in a hospital or clinic setting. Talk to your pediatrician regarding the use of this medicine in children. Special care may be needed. Overdosage: If you think you have taken too much of this medicine contact a poison control center or emergency room at once. NOTE: This medicine is only for you. Do not share this medicine with others. What if I miss a dose? This does not apply. What may interact with this medicine? Interactions are not expected. This list may not describe all possible interactions. Give your health care provider a list of all the medicines, herbs, non-prescription drugs, or dietary supplements you use. Also tell them if you smoke, drink alcohol, or use illegal drugs. Some items may interact with your medicine. What should I watch for while using this medicine? Tell your doctor or  healthcare professional if your symptoms do not start  to get better or if they get worse. If receiving this medicine for osteoarthritis, limit your activity after you receive your injection. Avoid physical activity for 48 hours following your injection to keep your knee from swelling. Do not stand on your feet for more than 1 hour at a time during the first 48 hours following your injection. Ask your doctor or healthcare professional about when you can begin major physical activity again. What side effects may I notice from receiving this medicine? Side effects that you should report to your doctor or health care professional as soon as possible: -allergic reactions like skin rash, itching or hives, swelling of the face, lips, or tongue -dizziness -facial flushing -pain, tingling, numbness in the hands or feet -vision changes if received this medicine during eye surgery Side effects that usually do not require medical attention (Report these to your doctor or health care professional if they continue or are bothersome.): -back pain -bruising at site where injected -chills -diarrhea -fever -headache -joint pain -joint stiffness -joint swelling -muscle cramps -muscle pain -nausea, vomiting -pain, redness, or irritation at site where injected -weak or tired This list may not describe all possible side effects. Call your doctor for medical advice about side effects. You may report side effects to FDA at 1-800-FDA-1088. Where should I keep my medicine? This drug is given in a hospital or clinic and will not be stored at home. NOTE: This sheet is a summary. It may not cover all possible information. If you have questions about this medicine, talk to your doctor, pharmacist, or health care provider.    2016, [employer]. (2014-09-01 11:10:34)

## 2016-05-15 NOTE — Patient Instructions (Signed)
Sodium Hyaluronate intra-articular injection What is this medicine? SODIUM HYALURONATE (SOE dee um hye al yoor ON ate) is used to treat pain in the knee due to osteoarthritis. This medicine may be used for other purposes; ask your health care provider or pharmacist if you have questions. What should I tell my health care provider before I take this medicine? They need to know if you have any of these conditions: -bleeding disorders -glaucoma -infection in the knee joint -skin conditions or sensitivity -skin infection -an unusual allergic reaction to sodium hyaluronate, other medicines, foods, dyes, or preservatives. Different brands of sodium hyaluronate contain different allergens. Some may contain egg. Talk to your doctor about your allergies to make sure that you get the right product. -pregnant or trying to get pregnant -breast-feeding How should I use this medicine? This medicine is for injection into the knee joint. It is given by a health care professional in a hospital or clinic setting. Talk to your pediatrician regarding the use of this medicine in children. Special care may be needed. Overdosage: If you think you have taken too much of this medicine contact a poison control center or emergency room at once. NOTE: This medicine is only for you. Do not share this medicine with others. What if I miss a dose? This does not apply. What may interact with this medicine? Interactions are not expected. This list may not describe all possible interactions. Give your health care provider a list of all the medicines, herbs, non-prescription drugs, or dietary supplements you use. Also tell them if you smoke, drink alcohol, or use illegal drugs. Some items may interact with your medicine. What should I watch for while using this medicine? Tell your doctor or healthcare professional if your symptoms do not start to get better or if they get worse. If receiving this medicine for osteoarthritis,  limit your activity after you receive your injection. Avoid physical activity for 48 hours following your injection to keep your knee from swelling. Do not stand on your feet for more than 1 hour at a time during the first 48 hours following your injection. Ask your doctor or healthcare professional about when you can begin major physical activity again. What side effects may I notice from receiving this medicine? Side effects that you should report to your doctor or health care professional as soon as possible: -allergic reactions like skin rash, itching or hives, swelling of the face, lips, or tongue -dizziness -facial flushing -pain, tingling, numbness in the hands or feet -vision changes if received this medicine during eye surgery Side effects that usually do not require medical attention (Report these to your doctor or health care professional if they continue or are bothersome.): -back pain -bruising at site where injected -chills -diarrhea -fever -headache -joint pain -joint stiffness -joint swelling -muscle cramps -muscle pain -nausea, vomiting -pain, redness, or irritation at site where injected -weak or tired This list may not describe all possible side effects. Call your doctor for medical advice about side effects. You may report side effects to FDA at 1-800-FDA-1088. Where should I keep my medicine? This drug is given in a hospital or clinic and will not be stored at home. NOTE: This sheet is a summary. It may not cover all possible information. If you have questions about this medicine, talk to your doctor, pharmacist, or health care provider.    2016, [employer]. (2014-09-01 11:10:34)

## 2016-05-16 DIAGNOSIS — I495 Sick sinus syndrome: Secondary | ICD-10-CM | POA: Diagnosis not present

## 2016-05-20 LAB — TOXASSURE SELECT 13 (MW), URINE

## 2016-05-23 ENCOUNTER — Encounter: Payer: Self-pay | Admitting: Pain Medicine

## 2016-05-23 ENCOUNTER — Ambulatory Visit: Payer: Medicare Other | Attending: Pain Medicine | Admitting: Pain Medicine

## 2016-05-23 VITALS — BP 125/80 | HR 61 | Temp 97.9°F | Resp 18 | Ht 68.0 in | Wt 224.0 lb

## 2016-05-23 DIAGNOSIS — M25561 Pain in right knee: Secondary | ICD-10-CM | POA: Diagnosis not present

## 2016-05-23 DIAGNOSIS — G8929 Other chronic pain: Secondary | ICD-10-CM | POA: Diagnosis not present

## 2016-05-23 DIAGNOSIS — M25562 Pain in left knee: Secondary | ICD-10-CM | POA: Diagnosis not present

## 2016-05-23 DIAGNOSIS — M170 Bilateral primary osteoarthritis of knee: Secondary | ICD-10-CM | POA: Diagnosis not present

## 2016-05-23 DIAGNOSIS — M778 Other enthesopathies, not elsewhere classified: Secondary | ICD-10-CM

## 2016-05-23 DIAGNOSIS — M779 Enthesopathy, unspecified: Secondary | ICD-10-CM

## 2016-05-23 MED ORDER — LIDOCAINE HCL (PF) 1 % IJ SOLN: 5.0000 mL | Freq: Once | INTRAMUSCULAR | Status: DC

## 2016-05-23 MED ORDER — LIDOCAINE HCL (PF) 1 % IJ SOLN: 10.0000 mL | Freq: Once | INTRAMUSCULAR | Status: DC

## 2016-05-23 MED ORDER — SODIUM HYALURONATE (VISCOSUP) 20 MG/2ML IX SOSY
2.0000 mL | PREFILLED_SYRINGE | Freq: Once | INTRA_ARTICULAR | Status: DC
  Filled 2016-05-23: qty 2

## 2016-05-23 MED ORDER — LIDOCAINE HCL (PF) 1 % IJ SOLN
INTRAMUSCULAR | Status: AC
  Administered 2016-05-23: 15:00:00
  Filled 2016-05-23: qty 10

## 2016-05-23 MED ORDER — ROPIVACAINE HCL 2 MG/ML IJ SOLN
INTRAMUSCULAR | Status: AC
  Administered 2016-05-23: 15:00:00
  Filled 2016-05-23: qty 10

## 2016-05-23 MED ORDER — ROPIVACAINE HCL 2 MG/ML IJ SOLN: 4.0000 mL | Freq: Once | INTRAMUSCULAR | Status: DC

## 2016-05-23 MED ORDER — DEXAMETHASONE SODIUM PHOSPHATE 10 MG/ML IJ SOLN: 10.0000 mg | Freq: Once | INTRAMUSCULAR | Status: DC

## 2016-05-23 MED ORDER — DEXAMETHASONE SODIUM PHOSPHATE 10 MG/ML IJ SOLN
INTRAMUSCULAR | Status: AC
  Administered 2016-05-23: 15:00:00
  Filled 2016-05-23: qty 1

## 2016-05-23 NOTE — Progress Notes (Signed)
Safety precautions to be maintained throughout the outpatient stay will include: orient to surroundings, keep bed in low position, maintain call bell within reach at all times, provide assistance with transfer out of bed and ambulation.  

## 2016-05-23 NOTE — Patient Instructions (Signed)
Trigger Point Injection Trigger points are areas where you have muscle pain. A trigger point injection is a shot given in the trigger point to relieve that pain. A trigger point might feel like a knot in your muscle. It hurts to press on a trigger point. Sometimes the pain spreads out (radiates) to other parts of the body. For example, pressing on a trigger point in your shoulder might cause pain in your arm or neck. You might have one trigger point. Or, you might have more than one. People often have trigger points in their upper back and lower back. They also occur often in the neck and shoulders. Pain from a trigger point lasts for a long time. It can make it hard to keep moving. You might not be able to do the exercise or physical therapy that could help you deal with the pain. A trigger point injection may help. It does not work for everyone. But, it may relieve your pain for a few days or a few months. A trigger point injection does not cure long-lasting (chronic) pain. LET YOUR CAREGIVER KNOW ABOUT:  Any allergies (especially to latex, lidocaine, or steroids).  Blood-thinning medicines that you take. These drugs can lead to bleeding or bruising after an injection. They include:  Aspirin.  Ibuprofen.  Clopidogrel.  Warfarin.  Other medicines you take. This includes all vitamins, herbs, eyedrops, over-the-counter medicines, and creams.  Use of steroids.  Recent infections.  Past problems with numbing medicines.  Bleeding problems.  Surgeries you have had.  Other health problems. RISKS AND COMPLICATIONS A trigger point injection is a safe treatment. However, problems may develop, such as:  Minor side effects usually go away in 1 to 2 days. These may include:  Soreness.  Bruising.  Stiffness.  More serious problems are rare. But, they may include:  Bleeding under the skin (hematoma).  Skin infection.  Breaking off of the needle under your skin.  Lung  puncture.  The trigger point injection may not work for you. BEFORE THE PROCEDURE You may need to stop taking any medicine that thins your blood. This is to prevent bleeding and bruising. Usually these medicines are stopped several days before the injection. No other preparation is needed. PROCEDURE  A trigger point injection can be given in your caregiver's office or in a clinic. Each injection takes 2 minutes or less.  Your caregiver will feel for trigger points. The caregiver may use a marker to circle the area for the injection.  The skin over the trigger point will be washed with a germ-killing (antiseptic) solution.  The caregiver pinches the spot for the injection.  Then, a very thin needle is used for the shot. You may feel pain or a twitching feeling when the needle enters the trigger point.  A numbing solution may be injected into the trigger point. Sometimes a drug to keep down swelling, redness, and warmth (inflammation) is also injected.  Your caregiver moves the needle around the trigger zone until the tightness and twitching goes away.  After the injection, your caregiver may put gentle pressure over the injection site.  Then it is covered with a bandage. AFTER THE PROCEDURE  You can go right home after the injection.  The bandage can be taken off after a few hours.  You may feel sore and stiff for 1 to 2 days.  Go back to your regular activities slowly. Your caregiver may ask you to stretch your muscles. Do not do anything that takes   extra energy for a few days.  Follow your caregiver's instructions to manage and treat other pain.   This information is not intended to replace advice given to you by your health care provider. Make sure you discuss any questions you have with your health care provider.   Document Released: 07/13/2011 Document Revised: 11/18/2012 Document Reviewed: 07/13/2011 Elsevier Interactive Patient Education 2016 [employer] Knee  Injection A knee injection is a procedure to get medicine into your knee joint. Your health care provider puts a needle into the joint and injects medicine with an attached syringe. The injected medicine may relieve the pain, swelling, and stiffness of arthritis. The injected medicine may also help to lubricate and cushion your knee joint. You may need more than one injection. LET YOUR HEALTH CARE PROVIDER KNOW ABOUT:  Any allergies you have.  All medicines you are taking, including vitamins, herbs, eye drops, creams, and over-the-counter medicines.  Previous problems you or members of your family have had with the use of anesthetics.  Any blood disorders you have.  Previous surgeries you have had.  Any medical conditions you may have. RISKS AND COMPLICATIONS Generally, this is a safe procedure. However, problems may occur, including:  Infection.  Bleeding.  Worsening symptoms.  Damage to the area around your knee.  Allergic reaction to any of the medicines.  Skin reactions from repeated injections. BEFORE THE PROCEDURE  Ask your health care provider about changing or stopping your regular medicines. This is especially important if you are taking diabetes medicines or blood thinners.  Plan to have someone take you home after the procedure. PROCEDURE  You will sit or lie down in a position for your knee to be treated.  The skin over your kneecap will be cleaned with a germ-killing solution (antiseptic).  You will be given a medicine that numbs the area (local anesthetic). You may feel some stinging.  After your knee becomes numb, you will have a second injection. This is the medicine. This needle is carefully placed between your kneecap and your knee. The medicine is injected into the joint space.  At the end of the procedure, the needle will be removed.  A bandage (dressing) may be placed over the injection site. The procedure may vary among health care providers and  hospitals. AFTER THE PROCEDURE  You may have to move your knee through its full range of motion. This helps to get all of the medicine into your joint space.  Your blood pressure, heart rate, breathing rate, and blood oxygen level will be monitored often until the medicines you were given have worn off.  You will be watched to make sure that you do not have a reaction to the injected medicine.   This information is not intended to replace advice given to you by your health care provider. Make sure you discuss any questions you have with your health care provider.   Document Released: 10/15/2006 Document Revised: 08/14/2014 Document Reviewed: 06/03/2014 Elsevier Interactive Patient Education 2016 [employer] Pain Management Discharge Instructions  General Discharge Instructions :  If you need to reach your doctor call: Monday-Friday 8:00 am - 4:00 pm at [phone] or toll free [phone].  After clinic hours [phone] to have operator reach doctor.  Bring all of your medication bottles to all your appointments in the pain clinic.  To cancel or reschedule your appointment with Pain Management please remember to call 24 hours in advance to avoid a fee.  Refer to the educational materials which   you have been given on: General Risks, I had my Procedure. Discharge Instructions, Post Sedation.  Post Procedure Instructions:  The drugs you were given will stay in your system until tomorrow, so for the next 24 hours you should not drive, make any legal decisions or drink any alcoholic beverages.  You may eat anything you prefer, but it is better to start with liquids then soups and crackers, and gradually work up to solid foods.  Please notify your doctor immediately if you have any unusual bleeding, trouble breathing or pain that is not related to your normal pain.  Depending on the type of procedure that was done, some parts of your body may feel week and/or numb.  This usually  clears up by tonight or the next day.  Walk with the use of an assistive device or accompanied by an adult for the 24 hours.  You may use ice on the affected area for the first 24 hours.  Put ice in a Ziploc bag and cover with a towel and place against area 15 minutes on 15 minutes off.  You may switch to heat after 24 hours. 

## 2016-05-23 NOTE — Progress Notes (Signed)
Patient's Name: Mark [name]  MRN: [id number]  Referring Provider: [name], MD  DOB: 05/13/1956  PCP: [name], MD  DOS: 05/23/2016  Note by: Sydnee Levans. Laban Emperor, MD  Service setting: Ambulatory outpatient  Location: [facility] (AMB) Pain Management Facility  Visit type: Procedure  Specialty: Interventional Pain Management  Patient type: Established   Primary Reason for Visit: Interventional Pain Management Treatment. CC: Hand Pain (right) and Knee Pain (both)  Procedure #1:  Anesthesia, Analgesia, Anxiolysis:  Type: Diagnostic Intra-Articular Hyalgan Knee Injection Region: Lateral infrapatellar Knee Region Level: Knee Joint Laterality: Bilateral  Type: Local Anesthesia Local Anesthetic: Lidocaine 1% Route: Infiltration (/IM) IV Access: Declined Sedation: Declined  Indication(s): Analgesia           Procedure #2:  Anesthesia, Analgesia, Anxiolysis:  Type: Ligament/Tendon sheath (40981) injection. Level: Dorsum of the right hand over the area of the thumb. Laterality: Right-sided Position: Sitting Target Area: Area surrounding the extensor pollicis longus and extensor pollicis brevis, proximal to the adductor aponeurosis and distal to the anatomical snuff-box. Approach: Posterior percutanous Region: Area distal to the anatomical snuff-box on the right hand. Primary Purpose: Diagnostic  Same   Indications: 1. Chronic knee pain (Bilateral) (R>L)   2. Osteoarthritis of both knees, unspecified osteoarthritis type   3. Tendinitis of extensor tendon of right hand   4. Primary osteoarthritis of both knees    Pain Score: Pre-procedure: 3 /10 Post-procedure: 0-No pain (No pain in knees bilaterally, pain in right hand 1)/10  Pre-Procedure Assessment:  Mark [name] is a 60 y.o. (year old), male patient, seen today for interventional treatment. He  has a past surgical history that includes Tonsillectomy (1964); Carpal tunnel release (Bilateral, 2006); Ulnar nerve repair  (2008); Cervical spine surgery (2008); Rotator cuff repair (Left, 2009); Colonoscopy (07/2007); pacemaker placement (2009); Lithotripsy (Bilateral, 2012); Lithotripsy (Left, 2017); and Cardiovascular stress test (2014).. His primarily concern today is the Hand Pain (right) and Knee Pain (both) The primary encounter diagnosis was Chronic knee pain (Bilateral) (R>L). Diagnoses of Osteoarthritis of both knees, unspecified osteoarthritis type, Tendinitis of extensor tendon of right hand, and Primary osteoarthritis of both knees were also pertinent to this visit.  Pain Type: Chronic pain Pain Location: Hand (knee pain on both knees/right knee is more painful  than left, constant, aching pain) Pain Orientation: Right Pain Descriptors / Indicators: Sore, Aching ("feels like a bruising pain") Pain Frequency: Constant  Date of Last Visit: 05/15/16 Service Provided on Last Visit: Med Refill  Coagulation Parameters Lab Results  Component Value Date   INR 1.0 06/03/2007   LABPROT 13.7 06/03/2007   APTT 29 06/03/2007   PLT 172.0 05/07/2015   Verification of the correct person, correct site (including marking of site), and correct procedure were performed and confirmed by the patient.  Consent: Before the procedure and under the influence of no sedative(s), amnesic(s), or anxiolytics, the patient was informed of the treatment options, risks and possible complications. To fulfill our ethical and legal obligations, as recommended by the American Medical Association's Code of Ethics, I have informed the patient of my clinical impression; the nature and purpose of the treatment or procedure; the risks, benefits, and possible complications of the intervention; the alternatives, including doing nothing; the risk(s) and benefit(s) of the alternative treatment(s) or procedure(s); and the risk(s) and benefit(s) of doing nothing. The patient was provided information about the general risks and possible complications  associated with the procedure. These may include, but are not limited to: failure to achieve desired  goals, infection, bleeding, organ or nerve damage, allergic reactions, paralysis, and death. In addition, the patient was informed of those risks and complications associated to the procedure, such as failure to decrease pain; infection; bleeding; organ or nerve damage with subsequent damage to sensory, motor, and/or autonomic systems, resulting in permanent pain, numbness, and/or weakness of one or several areas of the body; allergic reactions; (i.e.: anaphylactic reaction); and/or death. Furthermore, the patient was informed of those risks and complications associated with the medications. These include, but are not limited to: allergic reactions (i.e.: anaphylactic or anaphylactoid reaction(s)); adrenal axis suppression; blood sugar elevation that in diabetics may result in ketoacidosis or comma; water retention that in patients with history of congestive heart failure may result in shortness of breath, pulmonary edema, and decompensation with resultant heart failure; weight gain; swelling or edema; medication-induced neural toxicity; particulate matter embolism and blood vessel occlusion with resultant organ, and/or nervous system infarction; and/or aseptic necrosis of one or more joints. Finally, the patient was informed that Medicine is not an exact science; therefore, there is also the possibility of unforeseen or unpredictable risks and/or possible complications that may result in a catastrophic outcome. The patient indicated having understood very clearly. We have given the patient no guarantees and we have made no promises. Enough time was given to the patient to ask questions, all of which were answered to the patient's satisfaction. Mr. Villarruel has indicated that he wanted to continue with the procedure.  Consent Attestation: I, the ordering provider, attest that I have discussed with the patient the  benefits, risks, side-effects, alternatives, likelihood of achieving goals, and potential problems during recovery for the procedure that I have provided informed consent.  Pre-Procedure Preparation:  Safety Precautions: Allergies reviewed. The patient was asked about blood thinners, or active infections, both of which were denied. The patient was asked to confirm the procedure and laterality, before marking the site, and again before commencing the procedure. Appropriate site, procedure, and patient were confirmed by following the Joint Commission's Universal Protocol (UP.01.01.01), in the form of a "Time Out". The patient was asked to participate by confirming the accuracy of the "Time Out" information. Patient was assessed for positional comfort and pressure points before starting the procedure. Allergies: He is allergic to ciprofloxacin; metformin; penicillin v potassium; penicillins; buprenorphine hcl; and morphine and related.. Allergy Precautions: None required Infection Control Precautions: Sterile technique used. Standard Universal Precautions were taken as recommended by the Department of [facility] for Disease Control and Prevention (CDC). Standard pre-surgical skin prep was conducted. Respiratory hygiene and cough etiquette was practiced. Hand hygiene observed. Safe injection practices and needle disposal techniques followed. SDV (single dose vial) medications used. Medications properly checked for expiration dates and contaminants. Personal protective equipment (PPE) used as per protocol. Monitoring:  As per clinic protocol. Vitals:   05/23/16 1332 05/23/16 1410 05/23/16 1420  BP: 120/81 123/73 125/80  Pulse: 69 62 61  Resp: 16 18 18   Temp: 97.9 F (36.6 C)    TempSrc: Oral    SpO2: 100% 99% 98%  Weight: 224 lb (101.6 kg)    Height: 5\' 8"  (1.727 m)    Calculated BMI: Body mass index is 34.06 kg/m. Time-out: "Time-out" completed before starting procedure, as per  protocol.  Description of Procedure #1 Process:   Time-out: "Time-out" completed before starting procedure, as per protocol. Position: Sitting Target Area: Knee Joint Approach: Just above the Lateral tibial plateau, lateral to the infrapatellar tendon. Area Prepped: Entire knee area,  from the mid-thigh to the mid-shin. Prepping solution: ChloraPrep (2% chlorhexidine gluconate and 70% isopropyl alcohol) Safety Precautions: Aspiration looking for blood return was conducted prior to all injections. At no point did we inject any substances, as a needle was being advanced. No attempts were made at seeking any paresthesias. Safe injection practices and needle disposal techniques used. Medications properly checked for expiration dates. SDV (single dose vial) medications used. Description of the Procedure: Protocol guidelines were followed. The patient was placed in position over the fluoroscopy table. The target area was identified and the area prepped in the usual manner. Skin desensitized using vapocoolant spray. Skin & deeper tissues infiltrated with local anesthetic. Appropriate amount of time allowed to pass for local anesthetics to take effect. The procedure needles were then advanced to the target area. Proper needle placement secured. Negative aspiration confirmed. Solution injected in intermittent fashion, asking for systemic symptoms every 0.5cc of injectate. The needles were then removed and the area cleansed, making sure to leave some of the prepping solution back to take advantage of its long term bactericidal properties. EBL: None Materials & Medications Used:  Needle(s) Used: 22g - 1.5" Needle(s) Medications Administered today: Please see chart for details.  Description of Procedure #2 Process:   Time-out: "Time-out" completed before starting procedure, as per protocol. Area Prepped: Dorsal of right hand between the tendon of the extensor pollicis brevis and the tendon of the extensor  pollicis longus. Prepping solution: ChloraPrep (2% chlorhexidine gluconate and 70% isopropyl alcohol) Safety Precautions: Aspiration looking for blood return was conducted prior to all injections. At no point did we inject any substances, as a needle was being advanced. No attempts were made at seeking any paresthesias. Safe injection practices and needle disposal techniques used. Medications properly checked for expiration dates. SDV (single dose vial) medications used. Description of the Procedure: Protocol guidelines were followed. The patient was placed in position. The target area was identified and prepped in the usual manner. Skin & deeper tissues infiltrated with local anesthetic. Appropriate time provided for local anesthetics to take effect. The procedure needle was slowly advanced to target area. Proper needle placement secured. Negative aspiration confirmed. Solution injected in intermittent fashion, asking for systemic symptoms every 0.5cc. Needle(s) removed and area cleaned, making sure to leave some prepping solution back to take advantage of its long term bactericidal properties. EBL: None Materials & Medications:  Needle(s) Type: Spinal needle(s) Gauge: 25G Length: 1.5-in Medication(s): Please see chart order details.  Imaging Guidance:  Type of Imaging Technique: None used Indication(s): N/A Exposure Time: No patient exposure Contrast: None used. Fluoroscopic Guidance: N/A Ultrasound Guidance: N/A Interpretation: N/A Antibiotic Prophylaxis:  Indication(s): No indications identified. Type:  Antibiotics Given (last 72 hours)    None      Post-operative Assessment:  Complications: No immediate post-treatment complications observed by team, or reported by patient. Disposition: The patient tolerated the entire procedure well. A repeat set of vitals were taken after the procedure and the patient was kept under observation following institutional policy, for this type of  procedure. Post-procedural neurological assessment was performed, showing return to baseline, prior to discharge. The patient was provided with post-procedure discharge instructions, including a section on how to identify potential problems. Should any problems arise concerning this procedure, the patient was given instructions to immediately contact us, at any time, without hesitation. In any case, we plan to contact the patient by telephone for a follow-up status report regarding this interventional procedure. Comments:  No additional relevant information.  Plan of Care  Discharge to: Discharge home  Medications ordered for procedure: Meds ordered this encounter  Medications  . Sodium Hyaluronate SOSY 2 mL  . lidocaine (PF) (XYLOCAINE) 1 % injection 10 mL  . dexamethasone (DECADRON) injection 10 mg  . lidocaine (PF) (XYLOCAINE) 1 % injection 5 mL  . ropivacaine (PF) 2 mg/ml (0.2%) (NAROPIN) epidural 4 mL  . ropivacaine (PF) 2 mg/ml (0.2%) (NAROPIN) 2 MG/ML epidural    GARNER, CYNTHIA: cabinet override  . lidocaine (PF) (XYLOCAINE) 1 % injection    GARNER, CYNTHIA: cabinet override  . dexamethasone (DECADRON) 10 MG/ML injection    GARNER, CYNTHIA: cabinet override   Medications administered: (For more details, see medical record) We administered ropivacaine (PF) 2 mg/ml (0.2%), lidocaine (PF), and dexamethasone.  Imaging Ordered: No results found for this or any previous visit. New Prescriptions   No medications on file   Physician-requested Follow-up:  Return in about 2 weeks (around 06/06/2016) for Post-Procedure evaluation and injection #2, Schedule Procedure.  Future Appointments Date Time Provider Department Center  06/06/2016 1:30 PM Delano MetzFrancisco Chrystian Cupples, MD [facility] None  08/03/2016 1:15 PM Delano MetzFrancisco Ender Rorke, MD [facility] None  08/04/2016 9:40 AM [facility]  08/04/2016 9:45 AM Robert Bellowarlesia R Pinson, LPN [facility]  08/11/2016 3:00 PM Eustaquio BoydenJavier  Gutierrez, MD [facility]   Primary Care Physician: Eustaquio BoydenJavier Gutierrez, MD [facility] Note by: Sydnee LevansFrancisco A. Laban EmperorNaveira, M.D, DABA, DABAPM, DABPM, DABIPP, FIPP  Disclaimer:  Medicine is not an exact science. The only guarantee in medicine is that nothing is guaranteed. It is important to note that the decision to proceed with this intervention was based on the information collected from the patient. The Data and conclusions were drawn from the patient's questionnaire, the interview, and the physical examination. Because the information was provided in large part by the patient, it cannot be guaranteed that it has not been purposely or unconsciously manipulated. Every effort has been made to obtain as much relevant data as possible for this evaluation. It is important to note that the conclusions that lead to this procedure are derived in large part from the available data. Always take into account that the treatment will also be dependent on availability of resources and existing treatment guidelines, considered by other Pain Management Practitioners as being common knowledge and practice, at the time of the intervention. For Medico-Legal purposes, it is also important to point out that variation in procedural techniques and pharmacological choices are the acceptable norm. The indications, contraindications, technique, and results of the above procedure should only be interpreted and judged by a Board-Certified Interventional Pain Specialist with extensive familiarity and expertise in the same exact procedure and technique. Attempts at providing opinions without similar or greater experience and expertise than that of the treating physician will be considered as inappropriate and unethical, and shall result in a formal complaint to the state medical board and applicable specialty societies.  Instructions provided at this appointment: Patient Instructions   Trigger Point Injection Trigger points are areas where you have muscle pain. A trigger point injection is a shot given in the trigger point to relieve that pain. A trigger point might feel like a knot in your muscle. It hurts to press on a trigger point. Sometimes the pain spreads out (radiates) to other parts of the body. For example, pressing on a trigger point in your shoulder might cause pain in your arm or neck. You might have one trigger point. Or, you might have  more than one. People often have trigger points in their upper back and lower back. They also occur often in the neck and shoulders. Pain from a trigger point lasts for a long time. It can make it hard to keep moving. You might not be able to do the exercise or physical therapy that could help you deal with the pain. A trigger point injection may help. It does not work for everyone. But, it may relieve your pain for a few days or a few months. A trigger point injection does not cure long-lasting (chronic) pain. LET YOUR CAREGIVER KNOW ABOUT:  Any allergies (especially to latex, lidocaine, or steroids).  Blood-thinning medicines that you take. These drugs can lead to bleeding or bruising after an injection. They include:  Aspirin.  Ibuprofen.  Clopidogrel.  Warfarin.  Other medicines you take. This includes all vitamins, herbs, eyedrops, over-the-counter medicines, and creams.  Use of steroids.  Recent infections.  Past problems with numbing medicines.  Bleeding problems.  Surgeries you have had.  Other health problems. RISKS AND COMPLICATIONS A trigger point injection is a safe treatment. However, problems may develop, such as:  Minor side effects usually go away in 1 to 2 days. These may include:  Soreness.  Bruising.  Stiffness.  More serious problems are rare. But, they may include:  Bleeding under the skin (hematoma).  Skin infection.  Breaking off of the needle under your skin.  Lung  puncture.  The trigger point injection may not work for you. BEFORE THE PROCEDURE You may need to stop taking any medicine that thins your blood. This is to prevent bleeding and bruising. Usually these medicines are stopped several days before the injection. No other preparation is needed. PROCEDURE  A trigger point injection can be given in your caregiver's office or in a clinic. Each injection takes 2 minutes or less.  Your caregiver will feel for trigger points. The caregiver may use a marker to circle the area for the injection.  The skin over the trigger point will be washed with a germ-killing (antiseptic) solution.  The caregiver pinches the spot for the injection.  Then, a very thin needle is used for the shot. You may feel pain or a twitching feeling when the needle enters the trigger point.  A numbing solution may be injected into the trigger point. Sometimes a drug to keep down swelling, redness, and warmth (inflammation) is also injected.  Your caregiver moves the needle around the trigger zone until the tightness and twitching goes away.  After the injection, your caregiver may put gentle pressure over the injection site.  Then it is covered with a bandage. AFTER THE PROCEDURE  You can go right home after the injection.  The bandage can be taken off after a few hours.  You may feel sore and stiff for 1 to 2 days.  Go back to your regular activities slowly. Your caregiver may ask you to stretch your muscles. Do not do anything that takes extra energy for a few days.  Follow your caregiver's instructions to manage and treat other pain.   This information is not intended to replace advice given to you by your health care provider. Make sure you discuss any questions you have with your health care provider.   Document Released: 07/13/2011 Document Revised: 11/18/2012 Document Reviewed: 07/13/2011 Elsevier Interactive Patient Education 2016 [employer] Knee  Injection A knee injection is a procedure to get medicine into your knee joint. Your health care provider puts  a needle into the joint and injects medicine with an attached syringe. The injected medicine may relieve the pain, swelling, and stiffness of arthritis. The injected medicine may also help to lubricate and cushion your knee joint. You may need more than one injection. LET [facility] CARE PROVIDER KNOW ABOUT:  Any allergies you have.  All medicines you are taking, including vitamins, herbs, eye drops, creams, and over-the-counter medicines.  Previous problems you or members of your family have had with the use of anesthetics.  Any blood disorders you have.  Previous surgeries you have had.  Any medical conditions you may have. RISKS AND COMPLICATIONS Generally, this is a safe procedure. However, problems may occur, including:  Infection.  Bleeding.  Worsening symptoms.  Damage to the area around your knee.  Allergic reaction to any of the medicines.  Skin reactions from repeated injections. BEFORE THE PROCEDURE  Ask your health care provider about changing or stopping your regular medicines. This is especially important if you are taking diabetes medicines or blood thinners.  Plan to have someone take you home after the procedure. PROCEDURE  You will sit or lie down in a position for your knee to be treated.  The skin over your kneecap will be cleaned with a germ-killing solution (antiseptic).  You will be given a medicine that numbs the area (local anesthetic). You may feel some stinging.  After your knee becomes numb, you will have a second injection. This is the medicine. This needle is carefully placed between your kneecap and your knee. The medicine is injected into the joint space.  At the end of the procedure, the needle will be removed.  A bandage (dressing) may be placed over the injection site. The procedure may vary among health care providers and  hospitals. AFTER THE PROCEDURE  You may have to move your knee through its full range of motion. This helps to get all of the medicine into your joint space.  Your blood pressure, heart rate, breathing rate, and blood oxygen level will be monitored often until the medicines you were given have worn off.  You will be watched to make sure that you do not have a reaction to the injected medicine.   This information is not intended to replace advice given to you by your health care provider. Make sure you discuss any questions you have with your health care provider.   Document Released: 10/15/2006 Document Revised: 08/14/2014 Document Reviewed: 06/03/2014 Elsevier Interactive Patient Education 2016 [employer] Pain Management Discharge Instructions  General Discharge Instructions :  If you need to reach your doctor call: Monday-Friday 8:00 am - 4:00 pm at [phone] or toll free [phone].  After clinic hours [phone] to have operator reach doctor.  Bring all of your medication bottles to all your appointments in the pain clinic.  To cancel or reschedule your appointment with Pain Management please remember to call 24 hours in advance to avoid a fee.  Refer to the educational materials which you have been given on: General Risks, I had my Procedure. Discharge Instructions, Post Sedation.  Post Procedure Instructions:  The drugs you were given will stay in your system until tomorrow, so for the next 24 hours you should not drive, make any legal decisions or drink any alcoholic beverages.  You may eat anything you prefer, but it is better to start with liquids then soups and crackers, and gradually work up to solid foods.  Please notify your doctor immediately if you have any  unusual bleeding, trouble breathing or pain that is not related to your normal pain.  Depending on the type of procedure that was done, some parts of your body may feel week and/or numb.  This usually  clears up by tonight or the next day.  Walk with the use of an assistive device or accompanied by an adult for the 24 hours.  You may use ice on the affected area for the first 24 hours.  Put ice in a Ziploc bag and cover with a towel and place against area 15 minutes on 15 minutes off.  You may switch to heat after 24 hours.

## 2016-05-24 ENCOUNTER — Telehealth: Payer: Self-pay

## 2016-05-24 NOTE — Telephone Encounter (Signed)
Post procedure call: left message with patient.  

## 2016-06-06 ENCOUNTER — Encounter: Payer: Self-pay | Admitting: Pain Medicine

## 2016-06-06 ENCOUNTER — Ambulatory Visit: Payer: Medicare Other | Attending: Pain Medicine | Admitting: Pain Medicine

## 2016-06-06 VITALS — BP 116/72 | HR 82 | Temp 98.0°F | Resp 16 | Ht 68.0 in | Wt 224.0 lb

## 2016-06-06 DIAGNOSIS — G8929 Other chronic pain: Secondary | ICD-10-CM | POA: Insufficient documentation

## 2016-06-06 DIAGNOSIS — M4802 Spinal stenosis, cervical region: Secondary | ICD-10-CM

## 2016-06-06 DIAGNOSIS — M25562 Pain in left knee: Secondary | ICD-10-CM | POA: Diagnosis not present

## 2016-06-06 DIAGNOSIS — M170 Bilateral primary osteoarthritis of knee: Secondary | ICD-10-CM | POA: Insufficient documentation

## 2016-06-06 DIAGNOSIS — M25511 Pain in right shoulder: Secondary | ICD-10-CM

## 2016-06-06 DIAGNOSIS — Z888 Allergy status to other drugs, medicaments and biological substances status: Secondary | ICD-10-CM | POA: Insufficient documentation

## 2016-06-06 DIAGNOSIS — M25561 Pain in right knee: Secondary | ICD-10-CM | POA: Insufficient documentation

## 2016-06-06 DIAGNOSIS — Z885 Allergy status to narcotic agent status: Secondary | ICD-10-CM | POA: Insufficient documentation

## 2016-06-06 DIAGNOSIS — Z881 Allergy status to other antibiotic agents status: Secondary | ICD-10-CM | POA: Insufficient documentation

## 2016-06-06 DIAGNOSIS — M9981 Other biomechanical lesions of cervical region: Secondary | ICD-10-CM

## 2016-06-06 DIAGNOSIS — M25512 Pain in left shoulder: Secondary | ICD-10-CM

## 2016-06-06 DIAGNOSIS — Z880 Allergy status to penicillin: Secondary | ICD-10-CM | POA: Insufficient documentation

## 2016-06-06 DIAGNOSIS — M79641 Pain in right hand: Secondary | ICD-10-CM | POA: Diagnosis not present

## 2016-06-06 DIAGNOSIS — M542 Cervicalgia: Secondary | ICD-10-CM | POA: Diagnosis not present

## 2016-06-06 DIAGNOSIS — M4722 Other spondylosis with radiculopathy, cervical region: Secondary | ICD-10-CM | POA: Diagnosis not present

## 2016-06-06 MED ORDER — SODIUM HYALURONATE (VISCOSUP) 20 MG/2ML IX SOSY
2.0000 mL | PREFILLED_SYRINGE | Freq: Once | INTRA_ARTICULAR | Status: AC
  Administered 2016-06-06: 2 mL via INTRA_ARTICULAR
  Filled 2016-06-06: qty 2

## 2016-06-06 MED ORDER — LIDOCAINE HCL (PF) 1 % IJ SOLN
10.0000 mL | Freq: Once | INTRAMUSCULAR | Status: AC
  Administered 2016-06-06: 10 mL
  Filled 2016-06-06: qty 10

## 2016-06-06 MED ORDER — ROPIVACAINE HCL 2 MG/ML IJ SOLN
9.0000 mL | Freq: Once | INTRAMUSCULAR | Status: DC
  Filled 2016-06-06: qty 10

## 2016-06-06 NOTE — Progress Notes (Signed)
Safety precautions to be maintained throughout the outpatient stay will include: orient to surroundings, keep bed in low position, maintain call bell within reach at all times, provide assistance with transfer out of bed and ambulation.  

## 2016-06-06 NOTE — Progress Notes (Signed)
Patient's Name: Mark [name]  MRN: [id number]  Referring Provider: [name], MD  DOB: [birth year]  PCP: [name], MD  DOS: 06/06/2016  Note by: Sydnee Levans. Laban Emperor, MD  Service setting: Ambulatory outpatient  Location: [facility] (AMB) Pain Management Facility  Visit type: Procedure  Specialty: Interventional Pain Management  Patient type: Established   Primary Reason for Visit: Interventional Pain Management Treatment. CC: Knee Pain (bilateral, right is worse ) and Hand Pain (right)  Procedure:  Anesthesia, Analgesia, Anxiolysis:  Type: Therapeutic Intra-Articular Hyalgan Knee Injection #2 Region: Lateral infrapatellar Knee Region Level: Knee Joint Laterality: Bilateral  Type: Local Anesthesia Local Anesthetic: Lidocaine 1% Route: Infiltration ([place]/IM) IV Access: Declined Sedation: Declined  Indication(s): Analgesia          Indications: 1. Osteoarthritis of both knees, unspecified osteoarthritis type   2. Cervical foraminal stenosis (Severe C6-7) (Left)   3. Cervical spondylosis with radiculopathy (Bilateral) (L>R)   4. Chronic neck pain (Location of Primary Source of Pain) (Bilateral) (L>R)   5. Chronic pain of both shoulders    Pain Score: Pre-procedure: 2 /10 Post-procedure: 0-No pain (pt numb- no pain)/10  Pre-Procedure Assessment:  Mark [name] is a 60 y.o. (year old), male patient, seen today for interventional treatment. He  has a past surgical history that includes Tonsillectomy (1964); Carpal tunnel release (Bilateral, 2006); Ulnar nerve repair (2008); Cervical spine surgery (2008); Rotator cuff repair (Left, 2009); Colonoscopy (07/2007); pacemaker placement (2009); Lithotripsy (Bilateral, 2012); Lithotripsy (Left, 2017); and Cardiovascular stress test (2014).. His primarily concern today is the Knee Pain (bilateral, right is worse ) and Hand Pain (right) The primary encounter diagnosis was Osteoarthritis of both knees, unspecified osteoarthritis type. Diagnoses of  Cervical foraminal stenosis (Severe C6-7) (Left), Cervical spondylosis with radiculopathy (Bilateral) (L>R), Chronic neck pain (Location of Primary Source of Pain) (Bilateral) (L>R), and Chronic pain of both shoulders were also pertinent to this visit.  Pain Type: Chronic pain Pain Location: Knee Pain Orientation: Right, Left Pain Descriptors / Indicators: Aching Pain Frequency: Intermittent  Date of Last Visit: 05/23/16 Service Provided on Last Visit: Procedure  Coagulation Parameters Lab Results  Component Value Date   INR 1.0 06/03/2007   LABPROT 13.7 06/03/2007   APTT 29 06/03/2007   PLT 172.0 05/07/2015   Verification of the correct person, correct site (including marking of site), and correct procedure were performed and confirmed by the patient.  Consent: Before the procedure and under the influence of no sedative(s), amnesic(s), or anxiolytics, the patient was informed of the treatment options, risks and possible complications. To fulfill our ethical and legal obligations, as recommended by the American Medical Association's Code of Ethics, I have informed the patient of my clinical impression; the nature and purpose of the treatment or procedure; the risks, benefits, and possible complications of the intervention; the alternatives, including doing nothing; the risk(s) and benefit(s) of the alternative treatment(s) or procedure(s); and the risk(s) and benefit(s) of doing nothing. The patient was provided information about the general risks and possible complications associated with the procedure. These may include, but are not limited to: failure to achieve desired goals, infection, bleeding, organ or nerve damage, allergic reactions, paralysis, and death. In addition, the patient was informed of those risks and complications associated to the procedure, such as failure to decrease pain; infection; bleeding; organ or nerve damage with subsequent damage to sensory, motor, and/or  autonomic systems, resulting in permanent pain, numbness, and/or weakness of one or several areas of the body; allergic reactions; (i.e.: anaphylactic  reaction); and/or death. Furthermore, the patient was informed of those risks and complications associated with the medications. These include, but are not limited to: allergic reactions (i.e.: anaphylactic or anaphylactoid reaction(s)); adrenal axis suppression; blood sugar elevation that in diabetics may result in ketoacidosis or comma; water retention that in patients with history of congestive heart failure may result in shortness of breath, pulmonary edema, and decompensation with resultant heart failure; weight gain; swelling or edema; medication-induced neural toxicity; particulate matter embolism and blood vessel occlusion with resultant organ, and/or nervous system infarction; and/or aseptic necrosis of one or more joints. Finally, the patient was informed that Medicine is not an exact science; therefore, there is also the possibility of unforeseen or unpredictable risks and/or possible complications that may result in a catastrophic outcome. The patient indicated having understood very clearly. We have given the patient no guarantees and we have made no promises. Enough time was given to the patient to ask questions, all of which were answered to the patient's satisfaction. Mark [name] has indicated that he wanted to continue with the procedure.  Consent Attestation: I, the ordering provider, attest that I have discussed with the patient the benefits, risks, side-effects, alternatives, likelihood of achieving goals, and potential problems during recovery for the procedure that I have provided informed consent.  Pre-Procedure Preparation:  Safety Precautions: Allergies reviewed. The patient was asked about blood thinners, or active infections, both of which were denied. The patient was asked to confirm the procedure and laterality, before marking the  site, and again before commencing the procedure. Appropriate site, procedure, and patient were confirmed by following the Joint Commission's Universal Protocol (UP.01.01.01), in the form of a "Time Out". The patient was asked to participate by confirming the accuracy of the "Time Out" information. Patient was assessed for positional comfort and pressure points before starting the procedure. Allergies: He is allergic to ciprofloxacin; metformin; penicillin v potassium; penicillins; buprenorphine hcl; and morphine and related. Allergy Precautions: None required Infection Control Precautions: Sterile technique used. Standard Universal Precautions were taken as recommended by the Department of [facility] Center for Disease Control and Prevention (CDC). Standard pre-surgical skin prep was conducted. Respiratory hygiene and cough etiquette was practiced. Hand hygiene observed. Safe injection practices and needle disposal techniques followed. SDV (single dose vial) medications used. Medications properly checked for expiration dates and contaminants. Personal protective equipment (PPE) used as per protocol. Monitoring:  As per clinic protocol. Vitals:   06/06/16 1326 06/06/16 1415  BP: 114/66 116/72  Pulse: 86 82  Resp: 16 16  Temp: 98 F (36.7 C)   TempSrc: Oral   SpO2: 95% 97%  Weight: 224 lb (101.6 kg)   Height: 5\' 8"  (1.727 m)   Calculated BMI: Body mass index is 34.06 kg/m. Time-out: "Time-out" completed before starting procedure, as per protocol.  Description of Procedure Process:   Time-out: "Time-out" completed before starting procedure, as per protocol. Position: Sitting Target Area: Knee Joint Approach: Just above the Lateral tibial plateau, lateral to the infrapatellar tendon. Area Prepped: Entire knee area, from the mid-thigh to the mid-shin. Prepping solution: ChloraPrep (2% chlorhexidine gluconate and 70% isopropyl alcohol) Safety Precautions: Aspiration looking for blood return  was conducted prior to all injections. At no point did we inject any substances, as a needle was being advanced. No attempts were made at seeking any paresthesias. Safe injection practices and needle disposal techniques used. Medications properly checked for expiration dates. SDV (single dose vial) medications used. Description of the Procedure: Protocol guidelines were  followed. The patient was placed in position over the fluoroscopy table. The target area was identified and the area prepped in the usual manner. Skin desensitized using vapocoolant spray. Skin & deeper tissues infiltrated with local anesthetic. Appropriate amount of time allowed to pass for local anesthetics to take effect. The procedure needles were then advanced to the target area. Proper needle placement secured. Negative aspiration confirmed. Solution injected in intermittent fashion, asking for systemic symptoms every 0.5cc of injectate. The needles were then removed and the area cleansed, making sure to leave some of the prepping solution back to take advantage of its long term bactericidal properties. EBL: None Materials & Medications Used:  Needle(s) Used: 22g - 1.5" Needle(s) Medications Administered today: Please see chart for details.  Imaging Guidance:  Type of Imaging Technique: None used Indication(s): N/A Exposure Time: No patient exposure Contrast: None used. Fluoroscopic Guidance: N/A Ultrasound Guidance: N/A Interpretation: N/A  Antibiotic Prophylaxis:  Indication(s): No indications identified. Type:  Antibiotics Given (last 72 hours)    None      Post-operative Assessment:  Complications: No immediate post-treatment complications observed by team, or reported by patient. Disposition: The patient tolerated the entire procedure well. A repeat set of vitals were taken after the procedure and the patient was kept under observation following institutional policy, for this type of procedure. Post-procedural  neurological assessment was performed, showing return to baseline, prior to discharge. The patient was provided with post-procedure discharge instructions, including a section on how to identify potential problems. Should any problems arise concerning this procedure, the patient was given instructions to immediately contact us, at any time, without hesitation. In any case, we plan to contact the patient by telephone for a follow-up status report regarding this interventional procedure. Comments:  No additional relevant information.  Plan of Care  Discharge to: Discharge home  Medications ordered for procedure: Meds ordered this encounter  Medications  . Sodium Hyaluronate SOSY 2 mL  . lidocaine (PF) (XYLOCAINE) 1 % injection 10 mL  . ropivacaine (PF) 2 mg/ml (0.2%) (NAROPIN) epidural 9 mL  . Sodium Hyaluronate SOSY 2 mL   Medications administered: (For more details, see medical record) We administered Sodium Hyaluronate, lidocaine (PF), and Sodium Hyaluronate.  Imaging Ordered: No results found for this or any previous visit. New Prescriptions   No medications on file   Physician-requested Follow-up:  Return in about 2 weeks (around 06/20/2016) for Schedule Procedure.   Future Appointments Date Time Provider Department Center  06/21/2016 10:00 AM [name], MD [facility] None  07/14/2016 11:30 AM [name], MD [facility] None  08/04/2016 9:40 AM [facility]  08/04/2016 9:45 AM Robert Bellow, LPN [facility]  08/11/2016 3:00 PM [name], MD [facility]   Primary Care Physician: [name], MD Location: [facility] Note by: Sydnee Levans. Laban Emperor, M.D, DABA, DABAPM, DABPM, DABIPP, FIPP  Disclaimer:  Medicine is not an exact science. The only guarantee in medicine is that nothing is guaranteed. It is important to note that the decision to proceed with this intervention was based on  the information collected from the patient. The Data and conclusions were drawn from the patient's questionnaire, the interview, and the physical examination. Because the information was provided in large part by the patient, it cannot be guaranteed that it has not been purposely or unconsciously manipulated. Every effort has been made to obtain as much relevant data as possible for this evaluation. It is important to note that the conclusions  that lead to this procedure are derived in large part from the available data. Always take into account that the treatment will also be dependent on availability of resources and existing treatment guidelines, considered by other Pain Management Practitioners as being common knowledge and practice, at the time of the intervention. For Medico-Legal purposes, it is also important to point out that variation in procedural techniques and pharmacological choices are the acceptable norm. The indications, contraindications, technique, and results of the above procedure should only be interpreted and judged by a Board-Certified Interventional Pain Specialist with extensive familiarity and expertise in the same exact procedure and technique. Attempts at providing opinions without similar or greater experience and expertise than that of the treating physician will be considered as inappropriate and unethical, and shall result in a formal complaint to the state medical board and applicable specialty societies.  Instructions provided at this appointment: Patient Instructions   GENERAL RISKS AND COMPLICATIONS  What are the risk, side effects and possible complications? Generally speaking, most procedures are safe.  However, with any procedure there are risks, side effects, and the possibility of complications.  The risks and complications are dependent upon the sites that are lesioned, or the type of nerve block to be performed.  The closer the procedure is to the spine, the more  serious the risks are.  Great care is taken when placing the radio frequency needles, block needles or lesioning probes, but sometimes complications can occur. 1. Infection: Any time there is an injection through the skin, there is a risk of infection.  This is why sterile conditions are used for these blocks.  There are four possible types of infection. 1. Localized skin infection. 2. Central Nervous System Infection-This can be in the form of Meningitis, which can be deadly. 3. Epidural Infections-This can be in the form of an epidural abscess, which can cause pressure inside of the spine, causing compression of the spinal cord with subsequent paralysis. This would require an emergency surgery to decompress, and there are no guarantees that the patient would recover from the paralysis. 4. Discitis-This is an infection of the intervertebral discs.  It occurs in about 1% of discography procedures.  It is difficult to treat and it may lead to surgery.        2. Pain: the needles have to go through skin and soft tissues, will cause soreness.       3. Damage to internal structures:  The nerves to be lesioned may be near blood vessels or    other nerves which can be potentially damaged.       4. Bleeding: Bleeding is more common if the patient is taking blood thinners such as  aspirin, Coumadin, Ticiid, Plavix, etc., or if he/she have some genetic predisposition  such as hemophilia. Bleeding into the spinal canal can cause compression of the spinal  cord with subsequent paralysis.  This would require an emergency surgery to  decompress and there are no guarantees that the patient would recover from the  paralysis.       5. Pneumothorax:  Puncturing of a lung is a possibility, every time a needle is introduced in  the area of the chest or upper back.  Pneumothorax refers to free air around the  collapsed lung(s), inside of the thoracic cavity (chest cavity).  Another two possible  complications related to a  similar event would include: Hemothorax and Chylothorax.   These are variations of the Pneumothorax, where instead of air around  the collapsed  lung(s), you may have blood or chyle, respectively.       6. Spinal headaches: They may occur with any procedures in the area of the spine.       7. Persistent CSF (Cerebro-Spinal Fluid) leakage: This is a rare problem, but may occur  with prolonged intrathecal or epidural catheters either due to the formation of a fistulous  track or a dural tear.       8. Nerve damage: By working so close to the spinal cord, there is always a possibility of  nerve damage, which could be as serious as a permanent spinal cord injury with  paralysis.       9. Death:  Although rare, severe deadly allergic reactions known as "Anaphylactic  reaction" can occur to any of the medications used.      10. Worsening of the symptoms:  We can always make thing worse.  What are the chances of something like this happening? Chances of any of this occuring are extremely low.  By statistics, you have more of a chance of getting killed in a motor vehicle accident: while driving to the hospital than any of the above occurring .  Nevertheless, you should be aware that they are possibilities.  In general, it is similar to taking a shower.  Everybody knows that you can slip, hit your head and get killed.  Does that mean that you should not shower again?  Nevertheless always keep in mind that statistics do not mean anything if you happen to be on the wrong side of them.  Even if a procedure has a 1 (one) in a 1,000,000 (million) chance of going wrong, it you happen to be that one..Also, keep in mind that by statistics, you have more of a chance of having something go wrong when taking medications.  Who should not have this procedure? If you are on a blood thinning medication (e.g. Coumadin, Plavix, see list of "Blood Thinners"), or if you have an active infection going on, you should not have the  procedure.  If you are taking any blood thinners, please inform your physician.  How should I prepare for this procedure?  Do not eat or drink anything at least six hours prior to the procedure.  Bring a driver with you .  It cannot be a taxi.  Come accompanied by an adult that can drive you back, and that is strong enough to help you if your legs get weak or numb from the local anesthetic.  Take all of your medicines the morning of the procedure with just enough water to swallow them.  If you have diabetes, make sure that you are scheduled to have your procedure done first thing in the morning, whenever possible.  If you have diabetes, take only half of your insulin dose and notify our nurse that you have done so as soon as you arrive at the clinic.  If you are diabetic, but only take blood sugar pills (oral hypoglycemic), then do not take them on the morning of your procedure.  You may take them after you have had the procedure.  Do not take aspirin or any aspirin-containing medications, at least eleven (11) days prior to the procedure.  They may prolong bleeding.  Wear loose fitting clothing that may be easy to take off and that you would not mind if it got stained with Betadine or blood.  Do not wear any jewelry or perfume  Remove any nail coloring.  It will interfere with some of our monitoring equipment.  NOTE: Remember that this is not meant to be interpreted as a complete list of all possible complications.  Unforeseen problems may occur.  BLOOD THINNERS The following drugs contain aspirin or other products, which can cause increased bleeding during surgery and should not be taken for 2 weeks prior to and 1 week after surgery.  If you should need take something for relief of minor pain, you may take acetaminophen which is found in Tylenol,m Datril, Anacin-3 and Panadol. It is not blood thinner. The products listed below are.  Do not take any of the products listed below in  addition to any listed on your instruction sheet.  A.P.C or A.P.C with Codeine Codeine Phosphate Capsules #3 Ibuprofen Ridaura  ABC compound Congesprin Imuran rimadil  Advil Cope Indocin Robaxisal  Alka-Seltzer Effervescent Pain Reliever and Antacid Coricidin or Coricidin-D  Indomethacin Rufen  Alka-Seltzer plus Cold Medicine Cosprin Ketoprofen S-A-C Tablets  Anacin Analgesic Tablets or Capsules Coumadin Korlgesic Salflex  Anacin Extra Strength Analgesic tablets or capsules CP-2 Tablets Lanoril Salicylate  Anaprox Cuprimine Capsules Levenox Salocol  Anexsia-D Dalteparin Magan Salsalate  Anodynos Darvon compound Magnesium Salicylate Sine-off  Ansaid Dasin Capsules Magsal Sodium Salicylate  Anturane Depen Capsules Marnal Soma  APF Arthritis pain formula Dewitt's Pills Measurin Stanback  Argesic Dia-Gesic Meclofenamic Sulfinpyrazone  Arthritis Bayer Timed Release Aspirin Diclofenac Meclomen Sulindac  Arthritis pain formula Anacin Dicumarol Medipren Supac  Analgesic (Safety coated) Arthralgen Diffunasal Mefanamic Suprofen  Arthritis Strength Bufferin Dihydrocodeine Mepro Compound Suprol  Arthropan liquid Dopirydamole Methcarbomol with Aspirin Synalgos  ASA tablets/Enseals Disalcid Micrainin Tagament  Ascriptin Doan's Midol Talwin  Ascriptin A/D Dolene Mobidin Tanderil  Ascriptin Extra Strength Dolobid Moblgesic Ticlid  Ascriptin with Codeine Doloprin or Doloprin with Codeine Momentum Tolectin  Asperbuf Duoprin Mono-gesic Trendar  Aspergum Duradyne Motrin or Motrin IB Triminicin  Aspirin plain, buffered or enteric coated Durasal Myochrisine Trigesic  Aspirin Suppositories Easprin Nalfon Trillsate  Aspirin with Codeine Ecotrin Regular or Extra Strength Naprosyn Uracel  Atromid-S Efficin Naproxen Ursinus  Auranofin Capsules Elmiron Neocylate Vanquish  Axotal Emagrin Norgesic Verin  Azathioprine Empirin or Empirin with Codeine Normiflo Vitamin E  Azolid Emprazil Nuprin Voltaren  Bayer  Aspirin plain, buffered or children's or timed BC Tablets or powders Encaprin Orgaran Warfarin Sodium  Buff-a-Comp Enoxaparin Orudis Zorpin  Buff-a-Comp with Codeine Equegesic Os-Cal-Gesic   Buffaprin Excedrin plain, buffered or Extra Strength Oxalid   Bufferin Arthritis Strength Feldene Oxphenbutazone   Bufferin plain or Extra Strength Feldene Capsules Oxycodone with Aspirin   Bufferin with Codeine Fenoprofen Fenoprofen Pabalate or Pabalate-SF   Buffets II Flogesic Panagesic   Buffinol plain or Extra Strength Florinal or Florinal with Codeine Panwarfarin   Buf-Tabs Flurbiprofen Penicillamine   Butalbital Compound Four-way cold tablets Penicillin   Butazolidin Fragmin Pepto-Bismol   Carbenicillin Geminisyn Percodan   Carna Arthritis Reliever Geopen Persantine   Carprofen Gold's salt Persistin   Chloramphenicol Goody's Phenylbutazone   Chloromycetin Haltrain Piroxlcam   Clmetidine heparin Plaquenil   Cllnoril Hyco-pap Ponstel   Clofibrate Hydroxy chloroquine Propoxyphen         Before stopping any of these medications, be sure to consult the physician who ordered them.  Some, such as Coumadin (Warfarin) are ordered to prevent or treat serious conditions such as "deep thrombosis", "pumonary embolisms", and other heart problems.  The amount of time that you may need off of the medication may also vary with the medication and the reason for which you  were taking it.  If you are taking any of these medications, please make sure you notify your pain physician before you undergo any procedures.         Epidural Steroid Injection An epidural steroid injection is given to relieve pain in your neck, back, or legs that is caused by the irritation or swelling of a nerve root. This procedure involves injecting a steroid and numbing medicine (anesthetic) into the epidural space. The epidural space is the space between the outer covering of your spinal cord and the bones that form your backbone  (vertebra).  LET [facility] PROVIDER KNOW ABOUT:   Any allergies you have.  All medicines you are taking, including vitamins, herbs, eye drops, creams, and over-the-counter medicines such as aspirin.  Previous problems you or members of your family have had with the use of anesthetics.  Any blood disorders or blood clotting disorders you have.  Previous surgeries you have had.  Medical conditions you have. RISKS AND COMPLICATIONS Generally, this is a safe procedure. However, as with any procedure, complications can occur. Possible complications of epidural steroid injection include:  Headache.  Bleeding.  Infection.  Allergic reaction to the medicines.  Damage to your nerves. The response to this procedure depends on the underlying cause of the pain and its duration. People who have long-term (chronic) pain are less likely to benefit from epidural steroids than are those people whose pain comes on strong and suddenly. BEFORE THE PROCEDURE   Ask your health care provider about changing or stopping your regular medicines. You may be advised to stop taking blood-thinning medicines a few days before the procedure.  You may be given medicines to reduce anxiety.  Arrange for someone to take you home after the procedure. PROCEDURE   You will remain awake during the procedure. You may receive medicine to make you relaxed.  You will be asked to lie on your stomach.  The injection site will be cleaned.  The injection site will be numbed with a medicine (local anesthetic).  A needle will be injected through your skin into the epidural space.  Your health care provider will use an X-ray machine to ensure that the steroid is delivered closest to the affected nerve. You may have minimal discomfort at this time.  Once the needle is in the right position, the local anesthetic and the steroid will be injected into the epidural space.  The needle will then be removed and a  bandage will be applied to the injection site. AFTER THE PROCEDURE   You may be monitored for a short time before you go home.  You may feel weakness or numbness in your arm or leg, which disappears within hours.  You may be allowed to eat, drink, and take your regular medicine.  You may have soreness at the site of the injection.   This information is not intended to replace advice given to you by your health care provider. Make sure you discuss any questions you have with your health care provider.   Document Released: 10/31/2007 Document Revised: 03/26/2013 Document Reviewed: 01/10/2013 Elsevier Interactive Patient Education 2016 [employer] Knee Injection A knee injection is a procedure to get medicine into your knee joint. Your health care provider puts a needle into the joint and injects medicine with an attached syringe. The injected medicine may relieve the pain, swelling, and stiffness of arthritis. The injected medicine may also help to lubricate and cushion your knee joint. You may need more  than one injection. LET [facility] CARE PROVIDER KNOW ABOUT:  Any allergies you have.  All medicines you are taking, including vitamins, herbs, eye drops, creams, and over-the-counter medicines.  Previous problems you or members of your family have had with the use of anesthetics.  Any blood disorders you have.  Previous surgeries you have had.  Any medical conditions you may have. RISKS AND COMPLICATIONS Generally, this is a safe procedure. However, problems may occur, including:  Infection.  Bleeding.  Worsening symptoms.  Damage to the area around your knee.  Allergic reaction to any of the medicines.  Skin reactions from repeated injections. BEFORE THE PROCEDURE  Ask your health care provider about changing or stopping your regular medicines. This is especially important if you are taking diabetes medicines or blood thinners.  Plan to have someone take you home  after the procedure. PROCEDURE  You will sit or lie down in a position for your knee to be treated.  The skin over your kneecap will be cleaned with a germ-killing solution (antiseptic).  You will be given a medicine that numbs the area (local anesthetic). You may feel some stinging.  After your knee becomes numb, you will have a second injection. This is the medicine. This needle is carefully placed between your kneecap and your knee. The medicine is injected into the joint space.  At the end of the procedure, the needle will be removed.  A bandage (dressing) may be placed over the injection site. The procedure may vary among health care providers and hospitals. AFTER THE PROCEDURE  You may have to move your knee through its full range of motion. This helps to get all of the medicine into your joint space.  Your blood pressure, heart rate, breathing rate, and blood oxygen level will be monitored often until the medicines you were given have worn off.  You will be watched to make sure that you do not have a reaction to the injected medicine.   This information is not intended to replace advice given to you by your health care provider. Make sure you discuss any questions you have with your health care provider.   Document Released: 10/15/2006 Document Revised: 08/14/2014 Document Reviewed: 06/03/2014 Elsevier Interactive Patient Education [employer]

## 2016-06-06 NOTE — Patient Instructions (Signed)
GENERAL RISKS AND COMPLICATIONS  What are the risk, side effects and possible complications? Generally speaking, most procedures are safe.  However, with any procedure there are risks, side effects, and the possibility of complications.  The risks and complications are dependent upon the sites that are lesioned, or the type of nerve block to be performed.  The closer the procedure is to the spine, the more serious the risks are.  Great care is taken when placing the radio frequency needles, block needles or lesioning probes, but sometimes complications can occur. 1. Infection: Any time there is an injection through the skin, there is a risk of infection.  This is why sterile conditions are used for these blocks.  There are four possible types of infection. 1. Localized skin infection. 2. Central Nervous System Infection-This can be in the form of Meningitis, which can be deadly. 3. Epidural Infections-This can be in the form of an epidural abscess, which can cause pressure inside of the spine, causing compression of the spinal cord with subsequent paralysis. This would require an emergency surgery to decompress, and there are no guarantees that the patient would recover from the paralysis. 4. Discitis-This is an infection of the intervertebral discs.  It occurs in about 1% of discography procedures.  It is difficult to treat and it may lead to surgery.        2. Pain: the needles have to go through skin and soft tissues, will cause soreness.       3. Damage to internal structures:  The nerves to be lesioned may be near blood vessels or    other nerves which can be potentially damaged.       4. Bleeding: Bleeding is more common if the patient is taking blood thinners such as  aspirin, Coumadin, Ticiid, Plavix, etc., or if he/she have some genetic predisposition  such as hemophilia. Bleeding into the spinal canal can cause compression of the spinal  cord with subsequent paralysis.  This would require an  emergency surgery to  decompress and there are no guarantees that the patient would recover from the  paralysis.       5. Pneumothorax:  Puncturing of a lung is a possibility, every time a needle is introduced in  the area of the chest or upper back.  Pneumothorax refers to free air around the  collapsed lung(s), inside of the thoracic cavity (chest cavity).  Another two possible  complications related to a similar event would include: Hemothorax and Chylothorax.   These are variations of the Pneumothorax, where instead of air around the collapsed  lung(s), you may have blood or chyle, respectively.       6. Spinal headaches: They may occur with any procedures in the area of the spine.       7. Persistent CSF (Cerebro-Spinal Fluid) leakage: This is a rare problem, but may occur  with prolonged intrathecal or epidural catheters either due to the formation of a fistulous  track or a dural tear.       8. Nerve damage: By working so close to the spinal cord, there is always a possibility of  nerve damage, which could be as serious as a permanent spinal cord injury with  paralysis.       9. Death:  Although rare, severe deadly allergic reactions known as "Anaphylactic  reaction" can occur to any of the medications used.      10. Worsening of the symptoms:  We can always make thing worse.    What are the chances of something like this happening? Chances of any of this occuring are extremely low.  By statistics, you have more of a chance of getting killed in a motor vehicle accident: while driving to the hospital than any of the above occurring .  Nevertheless, you should be aware that they are possibilities.  In general, it is similar to taking a shower.  Everybody knows that you can slip, hit your head and get killed.  Does that mean that you should not shower again?  Nevertheless always keep in mind that statistics do not mean anything if you happen to be on the wrong side of them.  Even if a procedure has a 1  (one) in a 1,000,000 (million) chance of going wrong, it you happen to be that one..Also, keep in mind that by statistics, you have more of a chance of having something go wrong when taking medications.  Who should not have this procedure? If you are on a blood thinning medication (e.g. Coumadin, Plavix, see list of "Blood Thinners"), or if you have an active infection going on, you should not have the procedure.  If you are taking any blood thinners, please inform your physician.  How should I prepare for this procedure?  Do not eat or drink anything at least six hours prior to the procedure.  Bring a driver with you .  It cannot be a taxi.  Come accompanied by an adult that can drive you back, and that is strong enough to help you if your legs get weak or numb from the local anesthetic.  Take all of your medicines the morning of the procedure with just enough water to swallow them.  If you have diabetes, make sure that you are scheduled to have your procedure done first thing in the morning, whenever possible.  If you have diabetes, take only half of your insulin dose and notify our nurse that you have done so as soon as you arrive at the clinic.  If you are diabetic, but only take blood sugar pills (oral hypoglycemic), then do not take them on the morning of your procedure.  You may take them after you have had the procedure.  Do not take aspirin or any aspirin-containing medications, at least eleven (11) days prior to the procedure.  They may prolong bleeding.  Wear loose fitting clothing that may be easy to take off and that you would not mind if it got stained with Betadine or blood.  Do not wear any jewelry or perfume  Remove any nail coloring.  It will interfere with some of our monitoring equipment.  NOTE: Remember that this is not meant to be interpreted as a complete list of all possible complications.  Unforeseen problems may occur.  BLOOD THINNERS The following drugs  contain aspirin or other products, which can cause increased bleeding during surgery and should not be taken for 2 weeks prior to and 1 week after surgery.  If you should need take something for relief of minor pain, you may take acetaminophen which is found in Tylenol,m Datril, Anacin-3 and Panadol. It is not blood thinner. The products listed below are.  Do not take any of the products listed below in addition to any listed on your instruction sheet.  A.P.C or A.P.C with Codeine Codeine Phosphate Capsules #3 Ibuprofen Ridaura  ABC compound Congesprin Imuran rimadil  Advil Cope Indocin Robaxisal  Alka-Seltzer Effervescent Pain Reliever and Antacid Coricidin or Coricidin-D  Indomethacin Rufen    Alka-Seltzer plus Cold Medicine Cosprin Ketoprofen S-A-C Tablets  Anacin Analgesic Tablets or Capsules Coumadin Korlgesic Salflex  Anacin Extra Strength Analgesic tablets or capsules CP-2 Tablets Lanoril Salicylate  Anaprox Cuprimine Capsules Levenox Salocol  Anexsia-D Dalteparin Magan Salsalate  Anodynos Darvon compound Magnesium Salicylate Sine-off  Ansaid Dasin Capsules Magsal Sodium Salicylate  Anturane Depen Capsules Marnal Soma  APF Arthritis pain formula Dewitt's Pills Measurin Stanback  Argesic Dia-Gesic Meclofenamic Sulfinpyrazone  Arthritis Bayer Timed Release Aspirin Diclofenac Meclomen Sulindac  Arthritis pain formula Anacin Dicumarol Medipren Supac  Analgesic (Safety coated) Arthralgen Diffunasal Mefanamic Suprofen  Arthritis Strength Bufferin Dihydrocodeine Mepro Compound Suprol  Arthropan liquid Dopirydamole Methcarbomol with Aspirin Synalgos  ASA tablets/Enseals Disalcid Micrainin Tagament  Ascriptin Doan's Midol Talwin  Ascriptin A/D Dolene Mobidin Tanderil  Ascriptin Extra Strength Dolobid Moblgesic Ticlid  Ascriptin with Codeine Doloprin or Doloprin with Codeine Momentum Tolectin  Asperbuf Duoprin Mono-gesic Trendar  Aspergum Duradyne Motrin or Motrin IB Triminicin  Aspirin  plain, buffered or enteric coated Durasal Myochrisine Trigesic  Aspirin Suppositories Easprin Nalfon Trillsate  Aspirin with Codeine Ecotrin Regular or Extra Strength Naprosyn Uracel  Atromid-S Efficin Naproxen Ursinus  Auranofin Capsules Elmiron Neocylate Vanquish  Axotal Emagrin Norgesic Verin  Azathioprine Empirin or Empirin with Codeine Normiflo Vitamin E  Azolid Emprazil Nuprin Voltaren  Bayer Aspirin plain, buffered or children's or timed BC Tablets or powders Encaprin Orgaran Warfarin Sodium  Buff-a-Comp Enoxaparin Orudis Zorpin  Buff-a-Comp with Codeine Equegesic Os-Cal-Gesic   Buffaprin Excedrin plain, buffered or Extra Strength Oxalid   Bufferin Arthritis Strength Feldene Oxphenbutazone   Bufferin plain or Extra Strength Feldene Capsules Oxycodone with Aspirin   Bufferin with Codeine Fenoprofen Fenoprofen Pabalate or Pabalate-SF   Buffets II Flogesic Panagesic   Buffinol plain or Extra Strength Florinal or Florinal with Codeine Panwarfarin   Buf-Tabs Flurbiprofen Penicillamine   Butalbital Compound Four-way cold tablets Penicillin   Butazolidin Fragmin Pepto-Bismol   Carbenicillin Geminisyn Percodan   Carna Arthritis Reliever Geopen Persantine   Carprofen Gold's salt Persistin   Chloramphenicol Goody's Phenylbutazone   Chloromycetin Haltrain Piroxlcam   Clmetidine heparin Plaquenil   Cllnoril Hyco-pap Ponstel   Clofibrate Hydroxy chloroquine Propoxyphen         Before stopping any of these medications, be sure to consult the physician who ordered them.  Some, such as Coumadin (Warfarin) are ordered to prevent or treat serious conditions such as "deep thrombosis", "pumonary embolisms", and other heart problems.  The amount of time that you may need off of the medication may also vary with the medication and the reason for which you were taking it.  If you are taking any of these medications, please make sure you notify your pain physician before you undergo any  procedures.         Epidural Steroid Injection An epidural steroid injection is given to relieve pain in your neck, back, or legs that is caused by the irritation or swelling of a nerve root. This procedure involves injecting a steroid and numbing medicine (anesthetic) into the epidural space. The epidural space is the space between the outer covering of your spinal cord and the bones that form your backbone (vertebra).  LET YOUR HEALTH CARE PROVIDER KNOW ABOUT:   Any allergies you have.  All medicines you are taking, including vitamins, herbs, eye drops, creams, and over-the-counter medicines such as aspirin.  Previous problems you or members of your family have had with the use of anesthetics.  Any blood disorders or blood clotting disorders you have.    Previous surgeries you have had.  Medical conditions you have. RISKS AND COMPLICATIONS Generally, this is a safe procedure. However, as with any procedure, complications can occur. Possible complications of epidural steroid injection include:  Headache.  Bleeding.  Infection.  Allergic reaction to the medicines.  Damage to your nerves. The response to this procedure depends on the underlying cause of the pain and its duration. People who have long-term (chronic) pain are less likely to benefit from epidural steroids than are those people whose pain comes on strong and suddenly. BEFORE THE PROCEDURE   Ask your health care provider about changing or stopping your regular medicines. You may be advised to stop taking blood-thinning medicines a few days before the procedure.  You may be given medicines to reduce anxiety.  Arrange for someone to take you home after the procedure. PROCEDURE   You will remain awake during the procedure. You may receive medicine to make you relaxed.  You will be asked to lie on your stomach.  The injection site will be cleaned.  The injection site will be numbed with a medicine (local  anesthetic).  A needle will be injected through your skin into the epidural space.  Your health care provider will use an X-ray machine to ensure that the steroid is delivered closest to the affected nerve. You may have minimal discomfort at this time.  Once the needle is in the right position, the local anesthetic and the steroid will be injected into the epidural space.  The needle will then be removed and a bandage will be applied to the injection site. AFTER THE PROCEDURE   You may be monitored for a short time before you go home.  You may feel weakness or numbness in your arm or leg, which disappears within hours.  You may be allowed to eat, drink, and take your regular medicine.  You may have soreness at the site of the injection.   This information is not intended to replace advice given to you by your health care provider. Make sure you discuss any questions you have with your health care provider.   Document Released: 10/31/2007 Document Revised: 03/26/2013 Document Reviewed: 01/10/2013 Elsevier Interactive Patient Education 2016 [employer] Knee Injection A knee injection is a procedure to get medicine into your knee joint. Your health care provider puts a needle into the joint and injects medicine with an attached syringe. The injected medicine may relieve the pain, swelling, and stiffness of arthritis. The injected medicine may also help to lubricate and cushion your knee joint. You may need more than one injection. [facility] KNOW ABOUT:  Any allergies you have.  All medicines you are taking, including vitamins, herbs, eye drops, creams, and over-the-counter medicines.  Previous problems you or members of your family have had with the use of anesthetics.  Any blood disorders you have.  Previous surgeries you have had.  Any medical conditions you may have. RISKS AND COMPLICATIONS Generally, this is a safe procedure. However, problems may occur,  including:  Infection.  Bleeding.  Worsening symptoms.  Damage to the area around your knee.  Allergic reaction to any of the medicines.  Skin reactions from repeated injections. BEFORE THE PROCEDURE  Ask your health care provider about changing or stopping your regular medicines. This is especially important if you are taking diabetes medicines or blood thinners.  Plan to have someone take you home after the procedure. PROCEDURE  You will sit or lie down in a  position for your knee to be treated.  The skin over your kneecap will be cleaned with a germ-killing solution (antiseptic).  You will be given a medicine that numbs the area (local anesthetic). You may feel some stinging.  After your knee becomes numb, you will have a second injection. This is the medicine. This needle is carefully placed between your kneecap and your knee. The medicine is injected into the joint space.  At the end of the procedure, the needle will be removed.  A bandage (dressing) may be placed over the injection site. The procedure may vary among health care providers and hospitals. AFTER THE PROCEDURE  You may have to move your knee through its full range of motion. This helps to get all of the medicine into your joint space.  Your blood pressure, heart rate, breathing rate, and blood oxygen level will be monitored often until the medicines you were given have worn off.  You will be watched to make sure that you do not have a reaction to the injected medicine.   This information is not intended to replace advice given to you by your health care provider. Make sure you discuss any questions you have with your health care provider.   Document Released: 10/15/2006 Document Revised: 08/14/2014 Document Reviewed: 06/03/2014 Elsevier Interactive Patient Education [employer]

## 2016-06-21 ENCOUNTER — Ambulatory Visit: Admission: RE | Admit: 2016-06-21 | Payer: Medicare Other | Source: Ambulatory Visit

## 2016-06-21 ENCOUNTER — Ambulatory Visit (HOSPITAL_BASED_OUTPATIENT_CLINIC_OR_DEPARTMENT_OTHER): Payer: Medicare Other | Admitting: Pain Medicine

## 2016-06-21 ENCOUNTER — Ambulatory Visit
Admission: RE | Admit: 2016-06-21 | Discharge: 2016-06-21 | Disposition: A | Discharge status: Home / Self Care | Payer: Medicare Other | Source: Ambulatory Visit | Attending: Pain Medicine | Admitting: Pain Medicine

## 2016-06-21 ENCOUNTER — Encounter: Payer: Self-pay | Admitting: Pain Medicine

## 2016-06-21 VITALS — BP 127/93 | HR 88 | Temp 98.3°F | Resp 14 | Ht 68.0 in | Wt 224.0 lb

## 2016-06-21 DIAGNOSIS — M542 Cervicalgia: Secondary | ICD-10-CM | POA: Diagnosis present

## 2016-06-21 DIAGNOSIS — M5412 Radiculopathy, cervical region: Secondary | ICD-10-CM

## 2016-06-21 DIAGNOSIS — G8929 Other chronic pain: Secondary | ICD-10-CM | POA: Diagnosis not present

## 2016-06-21 DIAGNOSIS — Z888 Allergy status to other drugs, medicaments and biological substances status: Secondary | ICD-10-CM | POA: Insufficient documentation

## 2016-06-21 DIAGNOSIS — M4722 Other spondylosis with radiculopathy, cervical region: Secondary | ICD-10-CM | POA: Insufficient documentation

## 2016-06-21 DIAGNOSIS — Z881 Allergy status to other antibiotic agents status: Secondary | ICD-10-CM | POA: Diagnosis not present

## 2016-06-21 DIAGNOSIS — M4802 Spinal stenosis, cervical region: Secondary | ICD-10-CM | POA: Insufficient documentation

## 2016-06-21 DIAGNOSIS — Z885 Allergy status to narcotic agent status: Secondary | ICD-10-CM | POA: Insufficient documentation

## 2016-06-21 DIAGNOSIS — M170 Bilateral primary osteoarthritis of knee: Secondary | ICD-10-CM | POA: Diagnosis not present

## 2016-06-21 DIAGNOSIS — Z880 Allergy status to penicillin: Secondary | ICD-10-CM | POA: Diagnosis not present

## 2016-06-21 DIAGNOSIS — M9981 Other biomechanical lesions of cervical region: Secondary | ICD-10-CM | POA: Diagnosis not present

## 2016-06-21 DIAGNOSIS — Z950 Presence of cardiac pacemaker: Secondary | ICD-10-CM | POA: Insufficient documentation

## 2016-06-21 MED ORDER — FENTANYL CITRATE (PF) 100 MCG/2ML IJ SOLN: 25.0000 ug | INTRAMUSCULAR | Status: DC | PRN

## 2016-06-21 MED ORDER — SODIUM CHLORIDE 0.9 % IJ SOLN
INTRAMUSCULAR | Status: AC
  Administered 2016-06-21: 12:00:00
  Filled 2016-06-21: qty 10

## 2016-06-21 MED ORDER — LACTATED RINGERS IV SOLN: 1000.0000 mL | Freq: Once | INTRAVENOUS | Status: DC

## 2016-06-21 MED ORDER — ROPIVACAINE HCL 2 MG/ML IJ SOLN: 2.0000 mL | Freq: Once | INTRAMUSCULAR | Status: DC

## 2016-06-21 MED ORDER — SODIUM HYALURONATE (VISCOSUP) 20 MG/2ML IX SOSY
2.0000 mL | PREFILLED_SYRINGE | Freq: Once | INTRA_ARTICULAR | Status: AC
  Administered 2016-06-21: 2 mL via INTRA_ARTICULAR
  Filled 2016-06-21: qty 2

## 2016-06-21 MED ORDER — DEXAMETHASONE SODIUM PHOSPHATE 10 MG/ML IJ SOLN
INTRAMUSCULAR | Status: AC
  Administered 2016-06-21: 12:00:00
  Filled 2016-06-21: qty 1

## 2016-06-21 MED ORDER — ROPIVACAINE HCL 2 MG/ML IJ SOLN: 9.0000 mL | Freq: Once | INTRAMUSCULAR | Status: DC

## 2016-06-21 MED ORDER — IOPAMIDOL (ISOVUE-M 200) INJECTION 41%: 10.0000 mL | Freq: Once | INTRAMUSCULAR | Status: DC

## 2016-06-21 MED ORDER — IOPAMIDOL (ISOVUE-M 200) INJECTION 41%
INTRAMUSCULAR | Status: AC
  Administered 2016-06-21: 12:00:00
  Filled 2016-06-21: qty 10

## 2016-06-21 MED ORDER — MIDAZOLAM HCL 5 MG/5ML IJ SOLN
INTRAMUSCULAR | Status: AC
  Filled 2016-06-21: qty 5

## 2016-06-21 MED ORDER — LIDOCAINE HCL (PF) 1 % IJ SOLN: 10.0000 mL | Freq: Once | INTRAMUSCULAR | Status: DC

## 2016-06-21 MED ORDER — ROPIVACAINE HCL 2 MG/ML IJ SOLN
INTRAMUSCULAR | Status: AC
  Administered 2016-06-21: 12:00:00
  Filled 2016-06-21: qty 10

## 2016-06-21 MED ORDER — MIDAZOLAM HCL 5 MG/5ML IJ SOLN: 1.0000 mg | INTRAMUSCULAR | Status: DC | PRN

## 2016-06-21 MED ORDER — SODIUM CHLORIDE 0.9% FLUSH: 2.0000 mL | Freq: Once | INTRAVENOUS | Status: DC

## 2016-06-21 MED ORDER — FENTANYL CITRATE (PF) 100 MCG/2ML IJ SOLN
INTRAMUSCULAR | Status: AC
  Filled 2016-06-21: qty 2

## 2016-06-21 MED ORDER — DEXAMETHASONE SODIUM PHOSPHATE 10 MG/ML IJ SOLN: 10.0000 mg | Freq: Once | INTRAMUSCULAR | Status: DC

## 2016-06-21 NOTE — Progress Notes (Signed)
Safety precautions to be maintained throughout the outpatient stay will include: orient to surroundings, keep bed in low position, maintain call bell within reach at all times, provide assistance with transfer out of bed and ambulation.  

## 2016-06-21 NOTE — Progress Notes (Signed)
Patient's Name: Mark [name]  MRN: [id number]  Referring Provider: [name], MD  DOB: [birth year]  PCP: [name], MD  DOS: 06/21/2016  Note by: Sydnee Levans. Laban Emperor, MD  Service setting: Ambulatory outpatient  Location: [facility] (AMB) Pain Management Facility  Visit type: Procedure  Specialty: Interventional Pain Management  Patient type: Established   Primary Reason for Visit: Interventional Pain Management Treatment. CC: Neck Pain and Knee Pain (right)  Procedure #1:  Anesthesia, Analgesia, Anxiolysis:  Type: Palliative Intra-Articular Hyalgan Knee Injection #3 Region:         Knee Region Level: Knee Joint Laterality: Bilateral  Type: Local Anesthesia Local Anesthetic: Lidocaine 1% Route: Infiltration ([place]/IM) IV Access: Declined Sedation: Declined  Indication(s): Analgesia           Procedure #2:  Anesthesia, Analgesia, Anxiolysis:  Type: Diagnostic, Inter-Laminar, Epidural Steroid Injection Region: Posterior Cervico-thoracic Region Level: C7-T1 Laterality: Left-Sided Paramedial  Type: Local Anesthesia Local Anesthetic: Lidocaine 1% Route: Infiltration ([place]/IM) IV Access: Declined Sedation: Declined  Indication(s): Analgesia          Indications: 1. Chronic cervical radicular pain   2. Cervical spondylosis with radiculopathy (Bilateral) (L>R)   3. Cervical foraminal stenosis (Severe C6-7) (Left)   4. Chronic neck pain (Location of Primary Source of Pain) (Bilateral) (L>R)   5. Primary osteoarthritis of both knees    Pain Score: Pre-procedure: 3 /10 Post-procedure: 2 /10  Pre-Procedure Assessment:  Mark [name] is a 60 y.o. (year old), male patient, seen today for interventional treatment. He  has a past surgical history that includes Tonsillectomy (1964); Carpal tunnel release (Bilateral, 2006); Ulnar nerve repair (2008); Cervical spine surgery (2008); Rotator cuff repair (Left, 2009); Colonoscopy (07/2007); pacemaker placement (2009); Lithotripsy (Bilateral,  2012); Lithotripsy (Left, 2017); and Cardiovascular stress test (2014).. His primarily concern today is the Neck Pain and Knee Pain (right) The primary encounter diagnosis was Chronic cervical radicular pain. Diagnoses of Cervical spondylosis with radiculopathy (Bilateral) (L>R), Cervical foraminal stenosis (Severe C6-7) (Left), Chronic neck pain (Location of Primary Source of Pain) (Bilateral) (L>R), and Primary osteoarthritis of both knees were also pertinent to this visit.  Pain Type: Chronic pain Pain Location: Neck (knee) Pain Orientation:  (bilateral knees) Pain Descriptors / Indicators: Sharp, Aching, Radiating, Constant Pain Frequency: Constant  Date of Last Visit: 06/06/16 Service Provided on Last Visit: Procedure  Coagulation Parameters Lab Results  Component Value Date   INR 1.0 06/03/2007   LABPROT 13.7 06/03/2007   APTT 29 06/03/2007   PLT 172.0 05/07/2015   Verification of the correct person, correct site (including marking of site), and correct procedure were performed and confirmed by the patient.  Consent: Before the procedure and under the influence of no sedative(s), amnesic(s), or anxiolytics, the patient was informed of the treatment options, risks and possible complications. To fulfill our ethical and legal obligations, as recommended by the American Medical Association's Code of Ethics, I have informed the patient of my clinical impression; the nature and purpose of the treatment or procedure; the risks, benefits, and possible complications of the intervention; the alternatives, including doing nothing; the risk(s) and benefit(s) of the alternative treatment(s) or procedure(s); and the risk(s) and benefit(s) of doing nothing. The patient was provided information about the general risks and possible complications associated with the procedure. These may include, but are not limited to: failure to achieve desired goals, infection, bleeding, organ or nerve damage, allergic  reactions, paralysis, and death. In addition, the patient was informed of those risks and complications associated  to Spine-related procedures, such as failure to decrease pain; infection (i.e.: Meningitis, epidural or intraspinal abscess); bleeding (i.e.: epidural hematoma, subarachnoid hemorrhage, or any other type of intraspinal or peri-dural bleeding); organ or nerve damage (i.e.: Any type of peripheral nerve, nerve root, or spinal cord injury) with subsequent damage to sensory, motor, and/or autonomic systems, resulting in permanent pain, numbness, and/or weakness of one or several areas of the body; allergic reactions; (i.e.: anaphylactic reaction); and/or death. Furthermore, the patient was informed of those risks and complications associated with the medications. These include, but are not limited to: allergic reactions (i.e.: anaphylactic or anaphylactoid reaction(s)); adrenal axis suppression; blood sugar elevation that in diabetics may result in ketoacidosis or comma; water retention that in patients with history of congestive heart failure may result in shortness of breath, pulmonary edema, and decompensation with resultant heart failure; weight gain; swelling or edema; medication-induced neural toxicity; particulate matter embolism and blood vessel occlusion with resultant organ, and/or nervous system infarction; and/or aseptic necrosis of one or more joints. Finally, the patient was informed that Medicine is not an exact science; therefore, there is also the possibility of unforeseen or unpredictable risks and/or possible complications that may result in a catastrophic outcome. The patient indicated having understood very clearly. We have given the patient no guarantees and we have made no promises. Enough time was given to the patient to ask questions, all of which were answered to the patient's satisfaction. Mark [name] has indicated that he wanted to continue with the procedure.  Consent  Attestation: I, the ordering provider, attest that I have discussed with the patient the benefits, risks, side-effects, alternatives, likelihood of achieving goals, and potential problems during recovery for the procedure that I have provided informed consent.  Pre-Procedure Preparation:  Safety Precautions: Allergies reviewed. The patient was asked about blood thinners, or active infections, both of which were denied. The patient was asked to confirm the procedure and laterality, before marking the site, and again before commencing the procedure. Appropriate site, procedure, and patient were confirmed by following the Joint Commission's Universal Protocol (UP.01.01.01), in the form of a "Time Out". The patient was asked to participate by confirming the accuracy of the "Time Out" information. Patient was assessed for positional comfort and pressure points before starting the procedure. Allergies: He is allergic to ciprofloxacin; metformin; penicillin v potassium; penicillins; buprenorphine hcl; and morphine and related. Allergy Precautions: None required Infection Control Precautions: Sterile technique used. Standard Universal Precautions were taken as recommended by the Department of [facility] Center for Disease Control and Prevention (CDC). Standard pre-surgical skin prep was conducted. Respiratory hygiene and cough etiquette was practiced. Hand hygiene observed. Safe injection practices and needle disposal techniques followed. SDV (single dose vial) medications used. Medications properly checked for expiration dates and contaminants. Personal protective equipment (PPE) used as per protocol. Monitoring:  As per clinic protocol. Vitals:   06/21/16 1154 06/21/16 1204 06/21/16 1209 06/21/16 1212  BP: (!) 131/101 127/83 118/90 (!) 127/93  Pulse:      Resp: 17 16 18 14   Temp:      TempSrc:      SpO2: 93% 92% 93% 95%  Weight:      Height:      Calculated BMI: Body mass index is 34.06  kg/m. Time-out: "Time-out" completed before starting procedure, as per protocol.  Description of Procedure #1 Process:   Time-out: "Time-out" completed before starting procedure, as per protocol. Position: Sitting Target Area: Knee Joint Approach: Just above the Lateral tibial  plateau, lateral to the infrapatellar tendon. Area Prepped: Entire knee area, from the mid-thigh to the mid-shin. Prepping solution: ChloraPrep (2% chlorhexidine gluconate and 70% isopropyl alcohol) Safety Precautions: Aspiration looking for blood return was conducted prior to all injections. At no point did we inject any substances, as a needle was being advanced. No attempts were made at seeking any paresthesias. Safe injection practices and needle disposal techniques used. Medications properly checked for expiration dates. SDV (single dose vial) medications used. Description of the Procedure: Protocol guidelines were followed. The patient was placed in position over the fluoroscopy table. The target area was identified and the area prepped in the usual manner. Skin desensitized using vapocoolant spray. Skin & deeper tissues infiltrated with local anesthetic. Appropriate amount of time allowed to pass for local anesthetics to take effect. The procedure needles were then advanced to the target area. Proper needle placement secured. Negative aspiration confirmed. Solution injected in intermittent fashion, asking for systemic symptoms every 0.5cc of injectate. The needles were then removed and the area cleansed, making sure to leave some of the prepping solution back to take advantage of its long term bactericidal properties. EBL: None Materials & Medications Used:  Needle(s) Used: 25g - 1.5" Needle(s) Medications Administered today: Please see chart for details.  Imaging Guidance for procedure #1:  Type of Imaging Technique: None used Indication(s): N/A Exposure Time: No patient exposure Contrast: None used. Fluoroscopic  Guidance: N/A Ultrasound Guidance: N/A Interpretation: N/A  Description of Procedure #2 Process:   Time-out: "Time-out" completed before starting procedure, as per protocol. Position: Prone with head of the table was raised to facilitate breathing. Target Area: For Epidural Steroid injections the target is the interlaminar space, initially targeting the lower border of the superior vertebral body lamina. Approach: Paramedial approach. Area Prepped: Entire PosteriorCervical Region Prepping solution: ChloraPrep (2% chlorhexidine gluconate and 70% isopropyl alcohol) Safety Precautions: Aspiration looking for blood return was conducted prior to all injections. At no point did we inject any substances, as a needle was being advanced. No attempts were made at seeking any paresthesias. Safe injection practices and needle disposal techniques used. Medications properly checked for expiration dates. SDV (single dose vial) medications used. Description of the Procedure: Protocol guidelines were followed. The procedure needle was introduced through the skin, ipsilateral to the reported pain, and advanced to the target area. Bone was contacted and the needle walked caudad, until the lamina was cleared. The epidural space was identified using "loss-of-resistance technique" with 2-3 ml of PF-NaCl (0.9% NSS), in a 5cc LOR glass syringe. EBL: None Materials & Medications:  Needle(s) Used: 20g - 10cm, Tuohy-style epidural needle Medication(s): see below.  Imaging Guidance for procedure #2 (Spinal):  Type of Imaging Technique: Fluoroscopy Guidance (Spinal) Indication(s): Assistance in needle guidance and placement for procedures requiring needle placement in or near specific anatomical locations not easily accessible without such assistance. Exposure Time: Please see nurses notes. Contrast: Before injecting any contrast, we confirmed that the patient did not have an allergy to iodine, shellfish, or radiological  contrast. Once satisfactory needle placement was completed at the desired level, radiological contrast was injected. Contrast injected under live fluoroscopy. No contrast complications. See chart for type and volume of contrast used. Fluoroscopic Guidance: I was personally present during the use of fluoroscopy. "Tunnel Vision Technique" used to obtain the best possible view of the target area. Parallax error corrected before commencing the procedure. "Direction-depth-direction" technique used to introduce the needle under continuous pulsed fluoroscopy. Once target was reached, antero-posterior, oblique,  and lateral fluoroscopic projection used confirm needle placement in all planes. Images permanently stored in EMR. Interpretation: I personally interpreted the imaging intraoperatively. Adequate needle placement confirmed in multiple planes. Appropriate spread of contrast into desired area was observed. No evidence of afferent or efferent intravascular uptake. No intrathecal or subarachnoid spread observed. Permanent images saved into the patient's record.  Antibiotic Prophylaxis:  Indication(s): No indications identified. Type:  Antibiotics Given (last 72 hours)    None      Post-operative Assessment:  Complications: No immediate post-treatment complications observed by team, or reported by patient. Disposition: The patient tolerated the entire procedure well. A repeat set of vitals were taken after the procedure and the patient was kept under observation following institutional policy, for this type of procedure. Post-procedural neurological assessment was performed, showing return to baseline, prior to discharge. The patient was provided with post-procedure discharge instructions, including a section on how to identify potential problems. Should any problems arise concerning this procedure, the patient was given instructions to immediately contact us, at any time, without hesitation. In any case, we  plan to contact the patient by telephone for a follow-up status report regarding this interventional procedure. Comments:  No additional relevant information.  Plan of Care  Discharge to: Discharge home  Medications ordered for procedure: Meds ordered this encounter  Medications  . DISCONTD: fentaNYL (SUBLIMAZE) injection 25-50 mcg    Make sure Narcan is available in the pyxis when using this medication. In the event of respiratory depression (RR< 8/min): Titrate NARCAN (naloxone) in increments of 0.1 to 0.2 mg IV at 2-3 minute intervals, until desired degree of reversal.  . DISCONTD: lactated ringers infusion 1,000 mL  . DISCONTD: midazolam (VERSED) 5 MG/5ML injection 1-2 mg    Make sure Flumazenil is available in the pyxis when using this medication. If oversedation occurs, administer 0.2 mg IV over 15 sec. If after 45 sec no response, administer 0.2 mg again over 1 min; may repeat at 1 min intervals; not to exceed 4 doses (1 mg)  . dexamethasone (DECADRON) injection 10 mg  . iopamidol (ISOVUE-M) 41 % intrathecal injection 10 mL  . lidocaine (PF) (XYLOCAINE) 1 % injection 10 mL  . DISCONTD: sodium chloride flush (NS) 0.9 % injection 2 mL  . ropivacaine (PF) 2 mg/ml (0.2%) (NAROPIN) epidural 2 mL  . Sodium Hyaluronate SOSY 2 mL  . ropivacaine (PF) 2 mg/ml (0.2%) (NAROPIN) epidural 9 mL  . lidocaine (PF) (XYLOCAINE) 1 % injection 10 mL  . Sodium Hyaluronate SOSY 2 mL  . ropivacaine (PF) 2 mg/ml (0.2%) (NAROPIN) 2 MG/ML epidural    Roney Mans L: cabinet override  . iopamidol (ISOVUE-M) 41 % intrathecal injection    Roney Mans L: cabinet override  . sodium chloride 0.9 % injection    Roney Mans L: cabinet override  . fentaNYL (SUBLIMAZE) 100 MCG/2ML injection    Roney Mans L: cabinet override  . dexamethasone (DECADRON) 10 MG/ML injection    Roney Mans L: cabinet override  . midazolam (VERSED) 5 MG/5ML injection    Roney Mans L: cabinet override    Medications administered: (For more details, see medical record) We administered ropivacaine (PF) 2 mg/ml (0.2%), iopamidol, sodium chloride, and dexamethasone. Lab-work, Procedure(s), & Referral(s) Ordered: Orders Placed This Encounter  Procedures  . KNEE INJECTION  . DG C-Arm 1-60 Min-No Report   Imaging Ordered: No results found for this or any previous visit. New Prescriptions   No medications on file   Physician-requested Follow-up:  Return in about 2 weeks (around 07/05/2016) for Post-Procedure evaluation, in addition, procedure.  Future Appointments Date Time Provider Department Center  07/03/2016 1:15 PM [name], MD [facility] None  07/14/2016 11:30 AM [name], MD [facility] None  08/04/2016 9:40 AM [facility]  08/04/2016 9:45 AM Robert Bellow, LPN [facility]  08/11/2016 3:00 PM [name], MD [facility]   Primary Care Physician: [name], MD Location: [facility] Note by: Sydnee Levans. Laban Emperor, M.D, DABA, DABAPM, DABPM, DABIPP, FIPP  Disclaimer:  Medicine is not an exact science. The only guarantee in medicine is that nothing is guaranteed. It is important to note that the decision to proceed with this intervention was based on the information collected from the patient. The Data and conclusions were drawn from the patient's questionnaire, the interview, and the physical examination. Because the information was provided in large part by the patient, it cannot be guaranteed that it has not been purposely or unconsciously manipulated. Every effort has been made to obtain as much relevant data as possible for this evaluation. It is important to note that the conclusions that lead to this procedure are derived in large part from the available data. Always take into account that the treatment will also be dependent on availability of resources and existing treatment  guidelines, considered by other Pain Management Practitioners as being common knowledge and practice, at the time of the intervention. For Medico-Legal purposes, it is also important to point out that variation in procedural techniques and pharmacological choices are the acceptable norm. The indications, contraindications, technique, and results of the above procedure should only be interpreted and judged by a Board-Certified Interventional Pain Specialist with extensive familiarity and expertise in the same exact procedure and technique. Attempts at providing opinions without similar or greater experience and expertise than that of the treating physician will be considered as inappropriate and unethical, and shall result in a formal complaint to the state medical board and applicable specialty societies.  Instructions provided at this appointment: Patient Instructions  Pain Management Discharge Instructions  General Discharge Instructions :  If you need to reach your doctor call: Monday-Friday 8:00 am - 4:00 pm at [phone] or toll free [phone].  After clinic hours [phone] to have operator reach doctor.  Bring all of your medication bottles to all your appointments in the pain clinic.  To cancel or reschedule your appointment with Pain Management please remember to call 24 hours in advance to avoid a fee.  Refer to the educational materials which you have been given on: General Risks, I had my Procedure. Discharge Instructions, Post Sedation.  Post Procedure Instructions:  The drugs you were given will stay in your system until tomorrow, so for the next 24 hours you should not drive, make any legal decisions or drink any alcoholic beverages.  You may eat anything you prefer, but it is better to start with liquids then soups and crackers, and gradually work up to solid foods.  Please notify your doctor immediately if you have any unusual bleeding, trouble breathing or pain that  is not related to your normal pain.  Depending on the type of procedure that was done, some parts of your body may feel week and/or numb.  This usually clears up by tonight or the next day.  Walk with the use of an assistive device or accompanied by an adult for the 24 hours.  You may use ice on the affected area for the first  24 hours.  Put ice in a Ziploc bag and cover with a towel and place against area 15 minutes on 15 minutes off.  You may switch to heat after 24 hours.Epidural Steroid Injection Patient Information  Description: The epidural space surrounds the nerves as they exit the spinal cord.  In some patients, the nerves can be compressed and inflamed by a bulging disc or a tight spinal canal (spinal stenosis).  By injecting steroids into the epidural space, we can bring irritated nerves into direct contact with a potentially helpful medication.  These steroids act directly on the irritated nerves and can reduce swelling and inflammation which often leads to decreased pain.  Epidural steroids may be injected anywhere along the spine and from the neck to the low back depending upon the location of your pain.   After numbing the skin with local anesthetic (like Novocaine), a small needle is passed into the epidural space slowly.  You may experience a sensation of pressure while this is being done.  The entire block usually last less than 10 minutes.  Conditions which may be treated by epidural steroids:   Low back and leg pain  Neck and arm pain  Spinal stenosis  Post-laminectomy syndrome  Herpes zoster (shingles) pain  Pain from compression fractures  Preparation for the injection:  1. Do not eat any solid food or dairy products within 8 hours of your appointment.  2. You may drink clear liquids up to 3 hours before appointment.  Clear liquids include water, black coffee, juice or soda.  No milk or cream please. 3. You may take your regular medication, including pain  medications, with a sip of water before your appointment  Diabetics should hold regular insulin (if taken separately) and take 1/2 normal NPH dos the morning of the procedure.  Carry some sugar containing items with you to your appointment. 4. A driver must accompany you and be prepared to drive you home after your procedure.  5. Bring all your current medications with your. 6. An IV may be inserted and sedation may be given at the discretion of the physician.   7. A blood pressure cuff, EKG and other monitors will often be applied during the procedure.  Some patients may need to have extra oxygen administered for a short period. 8. You will be asked to provide medical information, including your allergies, prior to the procedure.  We must know immediately if you are taking blood thinners (like Coumadin/Warfarin)  Or if you are allergic to IV iodine contrast (dye). We must know if you could possible be pregnant.  Possible side-effects:  Bleeding from needle site  Infection (rare, may require surgery)  Nerve injury (rare)  Numbness & tingling (temporary)  Difficulty urinating (rare, temporary)  Spinal headache ( a headache worse with upright posture)  Light -headedness (temporary)  Pain at injection site (several days)  Decreased blood pressure (temporary)  Weakness in arm/leg (temporary)  Pressure sensation in back/neck (temporary)  Call if you experience:  Fever/chills associated with headache or increased back/neck pain.  Headache worsened by an upright position.  New onset weakness or numbness of an extremity below the injection site  Hives or difficulty breathing (go to the emergency room)  Inflammation or drainage at the infection site  Severe back/neck pain  Any new symptoms which are concerning to you  Please note:  Although the local anesthetic injected can often make your back or neck feel good for several hours after the injection, the  pain will likely  return.  It takes 3-7 days for steroids to work in the epidural space.  You may not notice any pain relief for at least that one week.  If effective, we will often do a series of three injections spaced 3-6 weeks apart to maximally decrease your pain.  After the initial series, we generally will wait several months before considering a repeat injection of the same type.  If you have any questions, please call [phone] [facility]

## 2016-06-21 NOTE — Patient Instructions (Addendum)
Pain Management Discharge Instructions  General Discharge Instructions :  If you need to reach your doctor call: Monday-Friday 8:00 am - 4:00 pm at [phone] or toll free [phone].  After clinic hours [phone] to have operator reach doctor.  Bring all of your medication bottles to all your appointments in the pain clinic.  To cancel or reschedule your appointment with Pain Management please remember to call 24 hours in advance to avoid a fee.  Refer to the educational materials which you have been given on: General Risks, I had my Procedure. Discharge Instructions, Post Sedation.  Post Procedure Instructions:  The drugs you were given will stay in your system until tomorrow, so for the next 24 hours you should not drive, make any legal decisions or drink any alcoholic beverages.  You may eat anything you prefer, but it is better to start with liquids then soups and crackers, and gradually work up to solid foods.  Please notify your doctor immediately if you have any unusual bleeding, trouble breathing or pain that is not related to your normal pain.  Depending on the type of procedure that was done, some parts of your body may feel week and/or numb.  This usually clears up by tonight or the next day.  Walk with the use of an assistive device or accompanied by an adult for the 24 hours.  You may use ice on the affected area for the first 24 hours.  Put ice in a Ziploc bag and cover with a towel and place against area 15 minutes on 15 minutes off.  You may switch to heat after 24 hours.Epidural Steroid Injection Patient Information  Description: The epidural space surrounds the nerves as they exit the spinal cord.  In some patients, the nerves can be compressed and inflamed by a bulging disc or a tight spinal canal (spinal stenosis).  By injecting steroids into the epidural space, we can bring irritated nerves into direct contact with a potentially helpful medication.  These  steroids act directly on the irritated nerves and can reduce swelling and inflammation which often leads to decreased pain.  Epidural steroids may be injected anywhere along the spine and from the neck to the low back depending upon the location of your pain.   After numbing the skin with local anesthetic (like Novocaine), a small needle is passed into the epidural space slowly.  You may experience a sensation of pressure while this is being done.  The entire block usually last less than 10 minutes.  Conditions which may be treated by epidural steroids:   Low back and leg pain  Neck and arm pain  Spinal stenosis  Post-laminectomy syndrome  Herpes zoster (shingles) pain  Pain from compression fractures  Preparation for the injection:  1. Do not eat any solid food or dairy products within 8 hours of your appointment.  2. You may drink clear liquids up to 3 hours before appointment.  Clear liquids include water, black coffee, juice or soda.  No milk or cream please. 3. You may take your regular medication, including pain medications, with a sip of water before your appointment  Diabetics should hold regular insulin (if taken separately) and take 1/2 normal NPH dos the morning of the procedure.  Carry some sugar containing items with you to your appointment. 4. A driver must accompany you and be prepared to drive you home after your procedure.  5. Bring all your current medications with your. 6. An IV may be inserted and   sedation may be given at the discretion of the physician.   7. A blood pressure cuff, EKG and other monitors will often be applied during the procedure.  Some patients may need to have extra oxygen administered for a short period. 8. You will be asked to provide medical information, including your allergies, prior to the procedure.  We must know immediately if you are taking blood thinners (like Coumadin/Warfarin)  Or if you are allergic to IV iodine contrast (dye). We must  know if you could possible be pregnant.  Possible side-effects:  Bleeding from needle site  Infection (rare, may require surgery)  Nerve injury (rare)  Numbness & tingling (temporary)  Difficulty urinating (rare, temporary)  Spinal headache ( a headache worse with upright posture)  Light -headedness (temporary)  Pain at injection site (several days)  Decreased blood pressure (temporary)  Weakness in arm/leg (temporary)  Pressure sensation in back/neck (temporary)  Call if you experience:  Fever/chills associated with headache or increased back/neck pain.  Headache worsened by an upright position.  New onset weakness or numbness of an extremity below the injection site  Hives or difficulty breathing (go to the emergency room)  Inflammation or drainage at the infection site  Severe back/neck pain  Any new symptoms which are concerning to you  Please note:  Although the local anesthetic injected can often make your back or neck feel good for several hours after the injection, the pain will likely return.  It takes 3-7 days for steroids to work in the epidural space.  You may not notice any pain relief for at least that one week.  If effective, we will often do a series of three injections spaced 3-6 weeks apart to maximally decrease your pain.  After the initial series, we generally will wait several months before considering a repeat injection of the same type.  If you have any questions, please call [phone] [facility] 

## 2016-06-22 ENCOUNTER — Telehealth: Payer: Self-pay | Admitting: *Deleted

## 2016-06-22 NOTE — Telephone Encounter (Signed)
Attempted to call patient for post procedure follow-up. Message left. 

## 2016-07-02 DIAGNOSIS — N200 Calculus of kidney: Secondary | ICD-10-CM | POA: Diagnosis not present

## 2016-07-03 ENCOUNTER — Ambulatory Visit: Payer: Medicare Other | Attending: Pain Medicine | Admitting: Pain Medicine

## 2016-07-03 ENCOUNTER — Encounter: Payer: Self-pay | Admitting: Pain Medicine

## 2016-07-03 VITALS — BP 106/75 | HR 81 | Temp 97.8°F | Resp 16 | Ht 68.0 in | Wt 225.0 lb

## 2016-07-03 DIAGNOSIS — M25562 Pain in left knee: Secondary | ICD-10-CM

## 2016-07-03 DIAGNOSIS — Z950 Presence of cardiac pacemaker: Secondary | ICD-10-CM | POA: Diagnosis not present

## 2016-07-03 DIAGNOSIS — Z8261 Family history of arthritis: Secondary | ICD-10-CM | POA: Diagnosis not present

## 2016-07-03 DIAGNOSIS — Z6834 Body mass index (BMI) 34.0-34.9, adult: Secondary | ICD-10-CM | POA: Insufficient documentation

## 2016-07-03 DIAGNOSIS — F411 Generalized anxiety disorder: Secondary | ICD-10-CM | POA: Diagnosis not present

## 2016-07-03 DIAGNOSIS — Z87891 Personal history of nicotine dependence: Secondary | ICD-10-CM | POA: Diagnosis not present

## 2016-07-03 DIAGNOSIS — Z79891 Long term (current) use of opiate analgesic: Secondary | ICD-10-CM | POA: Diagnosis not present

## 2016-07-03 DIAGNOSIS — M25511 Pain in right shoulder: Secondary | ICD-10-CM | POA: Diagnosis not present

## 2016-07-03 DIAGNOSIS — M19012 Primary osteoarthritis, left shoulder: Secondary | ICD-10-CM | POA: Insufficient documentation

## 2016-07-03 DIAGNOSIS — E785 Hyperlipidemia, unspecified: Secondary | ICD-10-CM | POA: Insufficient documentation

## 2016-07-03 DIAGNOSIS — Z79899 Other long term (current) drug therapy: Secondary | ICD-10-CM | POA: Diagnosis not present

## 2016-07-03 DIAGNOSIS — Z8249 Family history of ischemic heart disease and other diseases of the circulatory system: Secondary | ICD-10-CM | POA: Insufficient documentation

## 2016-07-03 DIAGNOSIS — M25561 Pain in right knee: Secondary | ICD-10-CM

## 2016-07-03 DIAGNOSIS — E669 Obesity, unspecified: Secondary | ICD-10-CM | POA: Insufficient documentation

## 2016-07-03 DIAGNOSIS — D696 Thrombocytopenia, unspecified: Secondary | ICD-10-CM | POA: Insufficient documentation

## 2016-07-03 DIAGNOSIS — Z888 Allergy status to other drugs, medicaments and biological substances status: Secondary | ICD-10-CM | POA: Insufficient documentation

## 2016-07-03 DIAGNOSIS — Z833 Family history of diabetes mellitus: Secondary | ICD-10-CM | POA: Insufficient documentation

## 2016-07-03 DIAGNOSIS — G894 Chronic pain syndrome: Secondary | ICD-10-CM | POA: Insufficient documentation

## 2016-07-03 DIAGNOSIS — Z7982 Long term (current) use of aspirin: Secondary | ICD-10-CM | POA: Insufficient documentation

## 2016-07-03 DIAGNOSIS — Z87442 Personal history of urinary calculi: Secondary | ICD-10-CM | POA: Insufficient documentation

## 2016-07-03 DIAGNOSIS — Z880 Allergy status to penicillin: Secondary | ICD-10-CM | POA: Insufficient documentation

## 2016-07-03 DIAGNOSIS — G8929 Other chronic pain: Secondary | ICD-10-CM

## 2016-07-03 DIAGNOSIS — F331 Major depressive disorder, recurrent, moderate: Secondary | ICD-10-CM | POA: Insufficient documentation

## 2016-07-03 DIAGNOSIS — Z885 Allergy status to narcotic agent status: Secondary | ICD-10-CM | POA: Insufficient documentation

## 2016-07-03 DIAGNOSIS — G4733 Obstructive sleep apnea (adult) (pediatric): Secondary | ICD-10-CM | POA: Diagnosis not present

## 2016-07-03 DIAGNOSIS — M4722 Other spondylosis with radiculopathy, cervical region: Secondary | ICD-10-CM | POA: Diagnosis not present

## 2016-07-03 DIAGNOSIS — M25512 Pain in left shoulder: Secondary | ICD-10-CM | POA: Insufficient documentation

## 2016-07-03 DIAGNOSIS — M4802 Spinal stenosis, cervical region: Secondary | ICD-10-CM | POA: Insufficient documentation

## 2016-07-03 DIAGNOSIS — N138 Other obstructive and reflux uropathy: Secondary | ICD-10-CM | POA: Insufficient documentation

## 2016-07-03 DIAGNOSIS — M48061 Spinal stenosis, lumbar region without neurogenic claudication: Secondary | ICD-10-CM | POA: Insufficient documentation

## 2016-07-03 DIAGNOSIS — K5903 Drug induced constipation: Secondary | ICD-10-CM | POA: Insufficient documentation

## 2016-07-03 DIAGNOSIS — G4700 Insomnia, unspecified: Secondary | ICD-10-CM | POA: Insufficient documentation

## 2016-07-03 DIAGNOSIS — T402X5A Adverse effect of other opioids, initial encounter: Secondary | ICD-10-CM | POA: Diagnosis not present

## 2016-07-03 DIAGNOSIS — M170 Bilateral primary osteoarthritis of knee: Secondary | ICD-10-CM | POA: Diagnosis not present

## 2016-07-03 DIAGNOSIS — N401 Enlarged prostate with lower urinary tract symptoms: Secondary | ICD-10-CM | POA: Insufficient documentation

## 2016-07-03 DIAGNOSIS — Z5181 Encounter for therapeutic drug level monitoring: Secondary | ICD-10-CM | POA: Insufficient documentation

## 2016-07-03 NOTE — Patient Instructions (Signed)
Preemptive Analgesia Technique  Definition:   A technique used to minimize the effects of an activity known to cause inflammation or swelling, which in turn leads to an increase in pain.  Purpose: To prevent swelling from occurring. It is based on the fact that it is easier to prevent swelling from happening than it is to get rid of it, once it occurs.  Contraindications: 1. Anyone with allergy or hypersensitivity to the recommended medications. 2. Anyone taking anticoagulants (Blood Thinners) (e.g., Coumadin, Warfarin, Plavix, etc.). 3. Patients in Renal Failure.  Technique: Before you undertake an activity known to cause pain, or a flare-up of your chronic pain, and before you experience any pain, do the following:  1. On a full stomach, take 4 (four) over the counter Ibuprofens 200mg tablets (Motrin), for a total of 800 mg. 2. In addition, take over the counter Magnesium 400 to 500 mg, before doing the activity.  3. Six (6) hours later, again on a full stomach, repeat the Ibuprofen. 4. That night, take a warm shower and stretch under the running warm water.  This technique may be sufficient to abort the pain and discomfort before it happens. Keep in mind that it takes a lot less medication to prevent swelling than it takes to eliminate it once it occurs.  

## 2016-07-03 NOTE — Progress Notes (Signed)
Patient's Name: Mark HansonDennis K [name]  MRN: [id number]  Referring Provider: Eustaquio BoydenGutierrez, Javier, MD  DOB: 01/23/1956  PCP: Eustaquio BoydenJavier Gutierrez, MD  DOS: 07/03/2016  Note by: Sydnee LevansFrancisco A. Laban EmperorNaveira, MD  Service setting: Ambulatory outpatient  Specialty: Interventional Pain Management  Location: [facility] (AMB) Pain Management Facility    Patient type: Established   Primary Reason(s) for Visit: Encounter for post-procedure evaluation of chronic illness with mild to moderate exacerbation CC: Neck Pain (both sides to the shoulder blades) and Knee Pain (both knees)  HPI  Mark [name] is a 60 y.o. year old, male patient, who comes today for a post-procedure evaluation. He has Chronic pain syndrome; MDD (major depressive disorder), recurrent episode, moderate (HCC); Insomnia; GAD (generalized anxiety disorder); Medicare annual wellness visit, subsequent; Hyperlipidemia; Thrombocytopenia (HCC); Obesity, Class II, BMI 35-39.9, with comorbidity (HCC); Routine general medical examination at a health care facility; Hydrocele in adult; Candidal dermatitis; Benign prostatic hyperplasia with urinary obstruction; Nephrolithiasis; Bradycardia; Cardiac murmur; Opiate use (60 MME/Day); Encounter for therapeutic drug level monitoring; Chronic neck pain (Location of Primary Source of Pain) (Bilateral) (L>R); Chronic shoulder pain (Bilateral) (L>R); Chronic knee pain (Bilateral) (R>L); History of permanent cardiac pacemaker placement; Opioid-induced constipation (OIC); Radicular pain of shoulder (Bilateral) (L>R); 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); Cervicogenic headache; Cervical spondylosis with radiculopathy (Bilateral) (L>R); Cervical facet syndrome (Bilateral) (L>R); Cervical foraminal stenosis (Severe C6-7) (Left); Chronic sacroiliac joint pain (Bilateral); Osteoarthritis of sacroiliac joint (Bilateral); Lumbar facet hypertrophy (L1-2, L2-3, and L4-5) (Bilateral); Lumbar IVDD (intervertebral disc displacement); Lumbar  lateral recess stenosis (L4-5) (Left); Lumbar facet syndrome (Bilateral); Osteoarthritis of shoulder (Left); Osteoarthritis of knee (Bilateral) (R>L); Obstructive sleep apnea; Hypocitraturia; Long term current use of opiate analgesic; Long term prescription opiate use; Cardiac pacemaker; Tendinitis of extensor tendon of right hand; and Chronic cervical radicular pain on his problem list. His primarily concern today is the Neck Pain (both sides to the shoulder blades) and Knee Pain (both knees)  Pain Assessment: Self-Reported Pain Score: 2 /10             Reported level is compatible with observation.       Pain Type: Chronic pain Pain Location: Neck Pain Orientation: Left, Right Pain Descriptors / Indicators: Aching, Constant Pain Frequency: Constant  Mark [name] comes in today for post-procedure evaluation after the treatment done on 06/21/2016. The patient is currently doing rather well and therefore we will see him on a PRN basis.  Further details on both, my assessment(s), as well as the proposed treatment plan, please see below.  Post-Procedure Assessment  06/21/2016 Procedure: Bilateral Hyalgan injection #3 + left cervical epidural steroid injection under fluoroscopic guidance and IV sedation. Influential Factors: BMI: 34.21 kg/m Intra-procedural challenges: None observed Assessment challenges: None detected         Post-procedural side-effects, adverse reactions, or complications: None reported Reported issues: None  Sedation: Please see nurses note. When no sedatives are used, the analgesic levels obtained are directly associated to the effectiveness of the local anesthetics. However, when sedation is provided, the level of analgesia obtained during the initial 1 hour following the intervention, is believed to be the result of a combination of factors. These factors may include, but are not limited to: 1. The effectiveness of the local anesthetics used. 2. The effects of the  analgesic(s) and/or anxiolytic(s) used. 3. The degree of discomfort experienced by the patient at the time of the procedure. 4. The patients ability and reliability in recalling and recording the events. 5. The presence  and influence of possible secondary gains and/or psychosocial factors. Reported result: Relief experienced during the 1st hour after the procedure: 90 % (90 %neck relief; left knee is100% relief 0-6hrs; 90 % relief overall; right knee is 90% relief 0-6hrs and 80% relief  overall) (Ultra-Short Term Relief) Interpretative annotation: Analgesia during this period is likely to be Local Anesthetic and/or IV Sedative (Analgesic/Anxiolitic) related.          Effects of local anesthetic: The analgesic effects attained during this period are directly associated to the localized infiltration of local anesthetics and therefore cary significant diagnostic value as to the etiological location, or anatomical origin, of the pain. Expected duration of relief is directly dependent on the pharmacodynamics of the local anesthetic used. Long-acting (4-6 hours) anesthetics used.  Reported result: Relief during the next 4 to 6 hour after the procedure: 90 % (neck 90 %: left knee 100% right knee is90 % relief) (Short-Term Relief) Interpretative annotation: Complete relief would suggest area to be the source of the pain.          Long-term benefit: Defined as the period of time past the expected duration of local anesthetics. With the possible exception of prolonged sympathetic blockade from the local anesthetics, benefits during this period are typically attributed to, or associated with, other factors such as analgesic sensory neuropraxia, antiinflammatory effects, or beneficial biochemical changes provided by agents other than the local anesthetics Reported result: Extended relief following procedure: 70 % (neck is70% ;left knee is 90 % and right knee is 80% relief ) (Long-Term Relief) Interpretative  annotation: Good relief. This could suggest inflammation to be a significant component in the etiology to the pain.          Current benefits: Defined as persistent relief that continues at this point in time.   Reported results: Treated area: 75 % In addition, the patient reports improvement in function Interpretative annotation: Ongoing benefits would suggest effective therapeutic approach  Interpretation: Results would suggest this therapy to be effective in the management of Mr. Avans condition.          Laboratory Chemistry  Inflammation Markers Lab Results  Component Value Date   ESRSEDRATE 6 09/01/2015   CRP 1.7 (H) 09/01/2015   Renal Function Lab Results  Component Value Date   BUN 24 (H) 12/29/2015   CREATININE 1.01 12/29/2015   GFRAA >60 09/01/2015   GFRNONAA >60 09/01/2015   Hepatic Function Lab Results  Component Value Date   AST 24 12/29/2015   ALT 23 12/29/2015   ALBUMIN 4.2 12/29/2015   Electrolytes Lab Results  Component Value Date   NA 140 12/29/2015   K 3.8 12/29/2015   CL 104 12/29/2015   CALCIUM 8.9 12/29/2015   MG 2.1 09/01/2015   Pain Modulating Vitamins Lab Results  Component Value Date   25OHVITD1 63 02/02/2016   25OHVITD2 <1.0 02/02/2016   25OHVITD3 63 02/02/2016   VITAMINB12 861 02/02/2016   Coagulation Parameters Lab Results  Component Value Date   INR 1.0 06/03/2007   LABPROT 13.7 06/03/2007   APTT 29 06/03/2007   PLT 172.0 05/07/2015   Cardiovascular Lab Results  Component Value Date   HGB 15.1 05/07/2015   HCT 44.2 05/07/2015   Note: Lab results reviewed.  Recent Diagnostic Imaging Review  No results found. Note: Imaging results reviewed.  Meds  The patient has a current medication list which includes the following prescription(s): aspirin, atorvastatin, cetirizine, vitamin d, clindamycin, clonazepam, cranberry, cranberry-vitamin c-vitamin e, desonide, fluoxetine, fluoxetine,  fluticasone, furosemide, garlic, multiple  vitamins-minerals, naloxone, naproxen, fish oil, omeprazole, oxybutynin, oxycodone, oxycodone, oxycodone, oxycodone, polyethylene glycol powder, potassium citrate, saw palmetto, sildenafil, vitamin b complex-c, oxycodone, and oxycodone.  Current Outpatient Prescriptions on File Prior to Visit  Medication Sig  . aspirin 81 MG tablet Take 81 mg by mouth daily.    Marland Kitchen atorvastatin (LIPITOR) 20 MG tablet Take 20 mg by mouth daily.  . cetirizine (ZYRTEC) 10 MG tablet Take 10 mg by mouth daily. As needed  . Cholecalciferol (VITAMIN D) 2000 units CAPS Take 1 capsule by mouth daily.  . clindamycin (CLEOCIN) 150 MG capsule Take 150 mg by mouth as needed. 4 caps 1 hour before dental procedures.   . clonazePAM (KLONOPIN) 1 MG tablet TAKE 1 BY MOUTH 3 TIMES DAILY AS NEEDED FOR ANXIETY  . Cranberry 400 MG CAPS Take 4,200 mg by mouth daily.   . Cranberry-Vitamin C-Vitamin E 4200-20-3 MG-MG-UNIT CAPS Take by mouth.  . desonide (DESOWEN) 0.05 % ointment Apply topically 2 (two) times daily. (Patient taking differently: Apply topically as needed. )  . FLUoxetine (PROZAC) 20 MG capsule TAKE 1 BY MOUTH DAILY  . FLUoxetine (PROZAC) 40 MG capsule TAKE 1 BY MOUTH DAILY  . fluticasone (FLONASE) 50 MCG/ACT nasal spray Place 2 sprays into both nostrils daily.  . furosemide (LASIX) 20 MG tablet TAKE 1 BY MOUTH DAILY  . Garlic 1000 MG CAPS Take 1 capsule by mouth daily.    . Multiple Vitamin (MULTIVITAMIN PO) Take 1 tablet by mouth daily.    . Naloxone HCl (NARCAN) 4 MG/0.1ML LIQD Spray into one nostril. Repeat with second device into other nostril after 2-3 minutes if no or minimal response.  . naproxen (NAPROSYN) 500 MG tablet TAKE 1 BY MOUTH TWICE DAILY WITH A MEAL  . Omega-3 Fatty Acids (FISH OIL) 1200 MG CAPS Take 2,400 mg by mouth 2 (two) times daily.   Marland Kitchen omeprazole (PRILOSEC) 20 MG capsule TAKE 1 BY MOUTH DAILY  . oxybutynin (DITROPAN) 5 MG tablet Take 5 mg by mouth.  . oxyCODONE (OXY IR/ROXICODONE) 5 MG immediate  release tablet Take 1 tablet (5 mg total) by mouth every 6 (six) hours as needed for severe pain.  [name] ON 07/28/2016] oxyCODONE (OXY IR/ROXICODONE) 5 MG immediate release tablet Take 1 tablet (5 mg total) by mouth every 6 (six) hours as needed for severe pain.  Marland Kitchen oxyCODONE (OXYCONTIN) 10 mg 12 hr tablet Take 1 tablet (10 mg total) by mouth every 12 (twelve) hours.  [name] ON 07/28/2016] oxyCODONE (OXYCONTIN) 10 mg 12 hr tablet Take 1 tablet (10 mg total) by mouth every 12 (twelve) hours.  . polyethylene glycol powder (MIRALAX) powder Take 1 Container by mouth every other day.  . potassium citrate (UROCIT-K) 10 MEQ (1080 MG) SR tablet Take 10 mEq by mouth 2 (two) times daily.  . Saw Palmetto 450 MG CAPS Take 900 mg by mouth daily.   . sildenafil (REVATIO) 20 MG tablet 3-5 tablets daily as needed  . VITAMIN B COMPLEX-C CAPS Take by mouth daily.   Marland Kitchen oxyCODONE (OXY IR/ROXICODONE) 5 MG immediate release tablet Take 1 tablet (5 mg total) by mouth every 6 (six) hours as needed for severe pain.  Marland Kitchen oxyCODONE (OXYCONTIN) 10 mg 12 hr tablet Take 1 tablet (10 mg total) by mouth every 12 (twelve) hours.   No current facility-administered medications on file prior to visit.    ROS  Constitutional: Denies any fever or chills Gastrointestinal: No reported hemesis, hematochezia, vomiting,  or acute GI distress Musculoskeletal: Denies any acute onset joint swelling, redness, loss of ROM, or weakness Neurological: No reported episodes of acute onset apraxia, aphasia, dysarthria, agnosia, amnesia, paralysis, loss of coordination, or loss of consciousness  Allergies  Mr. Sittner is allergic to ciprofloxacin; metformin; penicillin v potassium; penicillins; buprenorphine hcl; and morphine and related.  PFSH  Drug: Mr. Coger  reports that he does not use drugs. Alcohol:  reports that he does not drink alcohol. Tobacco:  reports that he has quit smoking. He has never used smokeless tobacco. Medical:  has a  past medical history of Anxiety; Chronic pain; Depression; History of kidney stones (April 12,2017); History of permanent cardiac pacemaker placement (05/31/2015); Kidney stones; Migraine; Prostatitis; and Sleep apnea. Family: family history includes Arthritis in his father and mother; Cancer in his father and mother; Diabetes in his mother; Heart block in his father; Heart disease in his father and mother.  Past Surgical History:  Procedure Laterality Date  . CARDIOVASCULAR STRESS TEST  2014   Dr Lady Gary  . CARPAL TUNNEL RELEASE Bilateral 2006  . CERVICAL SPINE SURGERY  2008   C5/6  . COLONOSCOPY  07/2007   WNL Markham Jordan)  . LITHOTRIPSY Bilateral 2012   Stoioff  . LITHOTRIPSY Left 2017   x2 Stoioff  . PACEMAKER PLACEMENT  2009   s/p Medtronic Adapta DR Pacemaker (Dr. Lady Gary)  . ROTATOR CUFF REPAIR Left 2009  . TONSILLECTOMY  1964  . ULNAR NERVE REPAIR  2008   Left   Constitutional Exam  General appearance: Well nourished, well developed, and well hydrated. In no apparent acute distress Vitals:   07/03/16 1322  BP: 106/75  Pulse: 81  Resp: 16  Temp: 97.8 F (36.6 C)  TempSrc: Oral  SpO2: 98%  Weight: 225 lb (102.1 kg)  Height: 5\' 8"  (1.727 m)   BMI Assessment: Estimated body mass index is 34.21 kg/m as calculated from the following:   Height as of this encounter: 5\' 8"  (1.727 m).   Weight as of this encounter: 225 lb (102.1 kg).  BMI interpretation table: BMI level Category Range association with higher incidence of chronic pain  <18 kg/m2 Underweight   18.5-24.9 kg/m2 Ideal body weight   25-29.9 kg/m2 Overweight Increased incidence by 20%  30-34.9 kg/m2 Obese (Class I) Increased incidence by 68%  35-39.9 kg/m2 Severe obesity (Class II) Increased incidence by 136%  >40 kg/m2 Extreme obesity (Class III) Increased incidence by 254%   BMI Readings from Last 4 Encounters:  07/03/16 34.21 kg/m  06/21/16 34.06 kg/m  06/06/16 34.06 kg/m  05/23/16 34.06 kg/m   Wt  Readings from Last 4 Encounters:  07/03/16 225 lb (102.1 kg)  06/21/16 224 lb (101.6 kg)  06/06/16 224 lb (101.6 kg)  05/23/16 224 lb (101.6 kg)  Psych/Mental status: Alert, oriented x 3 (person, place, & time) Eyes: PERLA Respiratory: No evidence of acute respiratory distress  Cervical Spine Exam  Inspection: No masses, redness, or swelling Alignment: Symmetrical Functional ROM: Unrestricted ROM Stability: No instability detected Muscle strength & Tone: Functionally intact Sensory: Unimpaired Palpation: Non-contributory  Upper Extremity (UE) Exam    Side: Right upper extremity  Side: Left upper extremity  Inspection: No masses, redness, swelling, or asymmetry  Inspection: No masses, redness, swelling, or asymmetry  Functional ROM: Unrestricted ROM          Functional ROM: Unrestricted ROM          Muscle strength & Tone: Functionally intact  Muscle strength & Tone: Functionally  intact  Sensory: Unimpaired  Sensory: Unimpaired  Palpation: Non-contributory  Palpation: Non-contributory   Thoracic Spine Exam  Inspection: No masses, redness, or swelling Alignment: Symmetrical Functional ROM: Unrestricted ROM Stability: No instability detected Sensory: Unimpaired Muscle strength & Tone: Functionally intact Palpation: Non-contributory  Lumbar Spine Exam  Inspection: No masses, redness, or swelling Alignment: Symmetrical Functional ROM: Unrestricted ROM Stability: No instability detected Muscle strength & Tone: Functionally intact Sensory: Unimpaired Palpation: Non-contributory Provocative Tests: Lumbar Hyperextension and rotation test: evaluation deferred today       Patrick's Maneuver: evaluation deferred today              Gait & Posture Assessment  Ambulation: Unassisted Gait: Relatively normal for age and body habitus Posture: WNL   Lower Extremity Exam    Side: Right lower extremity  Side: Left lower extremity  Inspection: No masses, redness, swelling, or  asymmetry  Inspection: No masses, redness, swelling, or asymmetry  Functional ROM: Unrestricted ROM          Functional ROM: Unrestricted ROM          Muscle strength & Tone: Functionally intact  Muscle strength & Tone: Functionally intact  Sensory: Unimpaired  Sensory: Unimpaired  Palpation: Non-contributory  Palpation: Non-contributory   Assessment  Primary Diagnosis & Pertinent Problem List: The primary encounter diagnosis was Primary osteoarthritis of both knees. Diagnoses of Chronic knee pain (Bilateral) (R>L) and Chronic pain syndrome were also pertinent to this visit.  Visit Diagnosis: 1. Primary osteoarthritis of both knees   2. Chronic knee pain (Bilateral) (R>L)   3. Chronic pain syndrome    Plan of Care  Pharmacotherapy (Medications Ordered): No orders of the defined types were placed in this encounter.  New Prescriptions   No medications on file   Medications administered today: Mark [name] had no medications administered during this visit. Lab-work, procedure(s), and/or referral(s): Orders Placed This Encounter  Procedures  . KNEE INJECTION   Imaging and/or referral(s): None  Interventional therapies: Planned, scheduled, and/or pending:   Bilateral intra-articular Hyalgan knee injection series (injection #4)    Considering:   Complete a series of 5 Hyalgan intra-articular knee injections, bilaterally.  Diagnostic bilateral cervical facet block under fluoroscopic guidance and IV sedation.  Possible bilateral cervical facet radiofrequency ablation under fluoroscopic guidance and IV sedation.  Diagnostic left sided cervical epidural steroid injection under fluoroscopic guidance, with or without sedation.  Diagnostic bilateral intra-articular knee injection, without fluoroscopy or sedation.  Diagnostic bilateral genicular nerve blocks under fluoroscopic guidance, with or without sedation.  Possible bilateral genicular nerve radiofrequency ablation under fluoroscopic  guidance and IV sedation.  Diagnostic bilateral sacroiliac joint block under fluoroscopic guidance, with or without sedation.  Possible bilateral sacroiliac joint radiofrequency ablation under fluoroscopic guidance and IV sedation.  Diagnostic bilateral intra-articular shoulder joint injection under fluoroscopic guidance, with or without sedation.  Diagnostic bilateral suprascapular nerve block under fluoroscopic guidance, with a without sedation.  Possible bilateral suprascapular nerve radiofrequency ablation under fluoroscopic guidance and IV sedation.  Diagnostic bilateral lumbar facet block under fluoroscopic guidance and IV sedation.  Possible bilateral lumbar facet radiofrequency ablation under fluoroscopic guidance and IV sedation.  Diagnostic left L4-5 lumbar epidural steroid injection under fluoroscopic guidance, with or without sedation.    Palliative PRN treatment(s):   Diagnostic bilateral cervical facet block under fluoroscopic guidance and IV sedation.  Diagnostic left sided cervical epidural steroid injection under fluoroscopic guidance, with or without sedation.  Diagnostic bilateral intra-articular knee injection, without fluoroscopy or sedation.  Diagnostic bilateral  genicular nerve blocks under fluoroscopic guidance, with or without sedation.  Diagnostic bilateral sacroiliac joint block under fluoroscopic guidance, with or without sedation.  Diagnostic bilateral intra-articular shoulder joint injection under fluoroscopic guidance, with or without sedation.  Diagnostic bilateral suprascapular nerve block under fluoroscopic guidance, with a without sedation.  Diagnostic bilateral lumbar facet block under fluoroscopic guidance and IV sedation.  Diagnostic left L4-5 lumbar epidural steroid injection under fluoroscopic guidance, with or without sedation.    Provider-requested follow-up: Return for previously scheduled appointment, Med-Mgmt, in addition, (PRN) procedure.  Future  Appointments Date Time Provider Department Center  07/14/2016 11:30 AM [name], MD [facility] None  08/04/2016 9:40 AM [facility]  08/04/2016 9:45 AM Robert Bellow, LPN [facility]  08/11/2016 3:00 PM [name], MD [facility]   Primary Care Physician: [name], MD Location: [facility] Outpatient [facility] Management [facility] Note by: Sydnee Levans. Laban Emperor, M.D, DABA, DABAPM, DABPM, DABIPP, FIPP  Pain Score Disclaimer: We use the NRS-11 scale. This is a self-reported, subjective measurement of pain severity with only modest accuracy. It is used primarily to identify changes within a particular patient. It must be understood that outpatient pain scales are significantly less accurate that those used for research, where they can be applied under ideal controlled circumstances with minimal exposure to variables. In reality, the score is likely to be a combination of pain intensity and pain affect, where pain affect describes the degree of emotional arousal or changes in action readiness caused by the sensory experience of pain. Factors such as social and work situation, setting, emotional state, anxiety levels, expectation, and prior pain experience may influence pain perception and show large inter-individual differences that may also be affected by time variables.  Patient instructions provided during this appointment: Patient Instructions  Preemptive Analgesia Technique  Definition:   A technique used to minimize the effects of an activity known to cause inflammation or swelling, which in turn leads to an increase in pain.  Purpose: To prevent swelling from occurring. It is based on the fact that it is easier to prevent swelling from happening than it is to get rid of it, once it occurs.  Contraindications: 1. Anyone with allergy or hypersensitivity to the recommended medications. 2. Anyone taking anticoagulants (Blood Thinners)  (e.g., Coumadin, Warfarin, Plavix, etc.). 3. Patients in Renal Failure.  Technique: Before you undertake an activity known to cause pain, or a flare-up of your chronic pain, and before you experience any pain, do the following:  1. On a full stomach, take 4 (four) over the counter Ibuprofens 200mg  tablets (Motrin), for a total of 800 mg. 2. In addition, take over the counter Magnesium 400 to 500 mg, before doing the activity.  3. Six (6) hours later, again on a full stomach, repeat the Ibuprofen. 4. That night, take a warm shower and stretch under the running warm water.  This technique may be sufficient to abort the pain and discomfort before it happens. Keep in mind that it takes a lot less medication to prevent swelling than it takes to eliminate it once it occurs.

## 2016-07-14 ENCOUNTER — Ambulatory Visit: Payer: Medicare Other | Attending: Pain Medicine | Admitting: Pain Medicine

## 2016-07-14 ENCOUNTER — Encounter: Payer: Self-pay | Admitting: Pain Medicine

## 2016-07-14 VITALS — BP 132/90 | HR 78 | Temp 98.1°F | Resp 16 | Ht 68.0 in | Wt 225.0 lb

## 2016-07-14 DIAGNOSIS — D696 Thrombocytopenia, unspecified: Secondary | ICD-10-CM | POA: Diagnosis not present

## 2016-07-14 DIAGNOSIS — E785 Hyperlipidemia, unspecified: Secondary | ICD-10-CM | POA: Insufficient documentation

## 2016-07-14 DIAGNOSIS — Z5181 Encounter for therapeutic drug level monitoring: Secondary | ICD-10-CM | POA: Diagnosis not present

## 2016-07-14 DIAGNOSIS — Z950 Presence of cardiac pacemaker: Secondary | ICD-10-CM | POA: Diagnosis not present

## 2016-07-14 DIAGNOSIS — Z79891 Long term (current) use of opiate analgesic: Secondary | ICD-10-CM | POA: Insufficient documentation

## 2016-07-14 DIAGNOSIS — M47898 Other spondylosis, sacral and sacrococcygeal region: Secondary | ICD-10-CM | POA: Insufficient documentation

## 2016-07-14 DIAGNOSIS — T402X5A Adverse effect of other opioids, initial encounter: Secondary | ICD-10-CM | POA: Insufficient documentation

## 2016-07-14 DIAGNOSIS — Z888 Allergy status to other drugs, medicaments and biological substances status: Secondary | ICD-10-CM | POA: Diagnosis not present

## 2016-07-14 DIAGNOSIS — G894 Chronic pain syndrome: Secondary | ICD-10-CM | POA: Insufficient documentation

## 2016-07-14 DIAGNOSIS — Z79899 Other long term (current) drug therapy: Secondary | ICD-10-CM | POA: Diagnosis not present

## 2016-07-14 DIAGNOSIS — Z8249 Family history of ischemic heart disease and other diseases of the circulatory system: Secondary | ICD-10-CM | POA: Insufficient documentation

## 2016-07-14 DIAGNOSIS — Z880 Allergy status to penicillin: Secondary | ICD-10-CM | POA: Diagnosis not present

## 2016-07-14 DIAGNOSIS — M4722 Other spondylosis with radiculopathy, cervical region: Secondary | ICD-10-CM | POA: Diagnosis not present

## 2016-07-14 DIAGNOSIS — K5903 Drug induced constipation: Secondary | ICD-10-CM | POA: Insufficient documentation

## 2016-07-14 DIAGNOSIS — Z885 Allergy status to narcotic agent status: Secondary | ICD-10-CM | POA: Diagnosis not present

## 2016-07-14 DIAGNOSIS — M170 Bilateral primary osteoarthritis of knee: Secondary | ICD-10-CM | POA: Diagnosis not present

## 2016-07-14 DIAGNOSIS — Z87891 Personal history of nicotine dependence: Secondary | ICD-10-CM | POA: Diagnosis not present

## 2016-07-14 DIAGNOSIS — N401 Enlarged prostate with lower urinary tract symptoms: Secondary | ICD-10-CM | POA: Insufficient documentation

## 2016-07-14 DIAGNOSIS — Z7982 Long term (current) use of aspirin: Secondary | ICD-10-CM | POA: Diagnosis not present

## 2016-07-14 DIAGNOSIS — Z6834 Body mass index (BMI) 34.0-34.9, adult: Secondary | ICD-10-CM | POA: Diagnosis not present

## 2016-07-14 DIAGNOSIS — Z981 Arthrodesis status: Secondary | ICD-10-CM | POA: Insufficient documentation

## 2016-07-14 DIAGNOSIS — M48061 Spinal stenosis, lumbar region without neurogenic claudication: Secondary | ICD-10-CM | POA: Insufficient documentation

## 2016-07-14 DIAGNOSIS — Z87442 Personal history of urinary calculi: Secondary | ICD-10-CM | POA: Insufficient documentation

## 2016-07-14 DIAGNOSIS — Z833 Family history of diabetes mellitus: Secondary | ICD-10-CM | POA: Diagnosis not present

## 2016-07-14 DIAGNOSIS — M25562 Pain in left knee: Secondary | ICD-10-CM

## 2016-07-14 DIAGNOSIS — F331 Major depressive disorder, recurrent, moderate: Secondary | ICD-10-CM | POA: Insufficient documentation

## 2016-07-14 DIAGNOSIS — M25561 Pain in right knee: Secondary | ICD-10-CM

## 2016-07-14 DIAGNOSIS — M542 Cervicalgia: Secondary | ICD-10-CM

## 2016-07-14 DIAGNOSIS — F1190 Opioid use, unspecified, uncomplicated: Secondary | ICD-10-CM

## 2016-07-14 DIAGNOSIS — E669 Obesity, unspecified: Secondary | ICD-10-CM | POA: Insufficient documentation

## 2016-07-14 DIAGNOSIS — N138 Other obstructive and reflux uropathy: Secondary | ICD-10-CM | POA: Insufficient documentation

## 2016-07-14 DIAGNOSIS — Z7951 Long term (current) use of inhaled steroids: Secondary | ICD-10-CM | POA: Insufficient documentation

## 2016-07-14 DIAGNOSIS — Z8261 Family history of arthritis: Secondary | ICD-10-CM | POA: Diagnosis not present

## 2016-07-14 DIAGNOSIS — M19012 Primary osteoarthritis, left shoulder: Secondary | ICD-10-CM | POA: Insufficient documentation

## 2016-07-14 DIAGNOSIS — G4700 Insomnia, unspecified: Secondary | ICD-10-CM | POA: Insufficient documentation

## 2016-07-14 DIAGNOSIS — G4733 Obstructive sleep apnea (adult) (pediatric): Secondary | ICD-10-CM | POA: Insufficient documentation

## 2016-07-14 DIAGNOSIS — G8929 Other chronic pain: Secondary | ICD-10-CM

## 2016-07-14 DIAGNOSIS — M4802 Spinal stenosis, cervical region: Secondary | ICD-10-CM | POA: Diagnosis not present

## 2016-07-14 DIAGNOSIS — F411 Generalized anxiety disorder: Secondary | ICD-10-CM | POA: Insufficient documentation

## 2016-07-14 MED ORDER — OXYCODONE HCL 5 MG PO TABS: 5.0000 mg | ORAL_TABLET | Freq: Four times a day (QID) | ORAL | 0 refills | Status: DC | PRN

## 2016-07-14 MED ORDER — OXYCODONE HCL ER 10 MG PO T12A: 10.0000 mg | EXTENDED_RELEASE_TABLET | Freq: Two times a day (BID) | ORAL | 0 refills | Status: DC

## 2016-07-14 MED ORDER — OXYCODONE HCL ER 10 MG PO T12A
10.0000 mg | EXTENDED_RELEASE_TABLET | Freq: Two times a day (BID) | ORAL | 0 refills | Status: DC
Start: 2016-10-26 — End: 2016-10-26

## 2016-07-14 NOTE — Progress Notes (Signed)
Nursing Pain Medication Assessment:  Safety precautions to be maintained throughout the outpatient stay will include: orient to surroundings, keep bed in low position, maintain call bell within reach at all times, provide assistance with transfer out of bed and ambulation.  Medication Inspection Compliance: Pill count conducted under aseptic conditions, in front of the patient. Neither the pills nor the bottle was removed from the patient's sight at any time. Once count was completed pills were immediately returned to the patient in their original bottle.  Medication #1: Oxycodone ER (OxyContin) Pill Count: 49 of 60 pills remain Bottle Appearance: Standard pharmacy container. Clearly labeled. Filled Date: 3811 / 22 / 2017 Medication last intake: 07/14/16  Medication #2: Oxycodone IR Pill Count: 10 of 120 pills remain Bottle Appearance: Standard pharmacy container. Clearly labeled. Filled Date: 9211 / 22 / 2017 Medication last intake:07/14/16

## 2016-07-14 NOTE — Progress Notes (Signed)
Patient's Name: Mark [name]  MRN: [id number]  Referring Provider: Ria Bush, MD  DOB: [birth year]  PCP: Ria Bush, MD  DOS: 07/14/2016  Note by: Kathlen Brunswick. Dossie Arbour, MD  Service setting: Ambulatory outpatient  Specialty: Interventional Pain Management  Location: [facility] (AMB) Pain Management Facility    Patient type: Established   Primary Reason(s) for Visit: Encounter for prescription drug management (Level of risk: moderate) CC: Neck Pain; Shoulder Pain (bilateral primarily left); and Knee Pain (right)  HPI  Mark [name] is a 60 y.o. year old, male patient, who comes today for a medication management evaluation. He has Chronic pain syndrome; MDD (major depressive disorder), recurrent episode, moderate ([place]); Insomnia; GAD (generalized anxiety disorder); Medicare annual wellness visit, subsequent; Hyperlipidemia; Thrombocytopenia ([place]); Obesity, Class II, BMI 35-39.9, with comorbidity ([place]); Routine general medical examination at a health care facility; Hydrocele in adult; Candidal dermatitis; Benign prostatic hyperplasia with urinary obstruction; Nephrolithiasis; Bradycardia; Cardiac murmur; Opiate use (60 MME/Day); Encounter for therapeutic drug level monitoring; Chronic neck pain (Location of Primary Source of Pain) (Bilateral) (L>R); Chronic shoulder pain (Bilateral) (L>R); Chronic knee pain (Bilateral) (R>L); History of permanent cardiac pacemaker placement; Opioid-induced constipation (OIC); Radicular pain of shoulder (Bilateral) (L>R); 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); Cervicogenic headache; Cervical spondylosis with radiculopathy (Bilateral) (L>R); Cervical facet syndrome (Bilateral) (L>R); Cervical foraminal stenosis (Severe C6-7) (Left); Chronic sacroiliac joint pain (Bilateral); Osteoarthritis of sacroiliac joint (Bilateral); Lumbar facet hypertrophy (L1-2, L2-3, and L4-5) (Bilateral); Lumbar IVDD (intervertebral disc displacement); Lumbar lateral recess  stenosis (L4-5) (Left); Lumbar facet syndrome (Bilateral); Osteoarthritis of shoulder (Left); Osteoarthritis of knee (Bilateral) (R>L); Obstructive sleep apnea; Hypocitraturia; Long term current use of opiate analgesic; Long term prescription opiate use; Cardiac pacemaker; Tendinitis of extensor tendon of right hand; and Chronic cervical radicular pain on his problem list. His primarily concern today is the Neck Pain; Shoulder Pain (bilateral primarily left); and Knee Pain (right)  Pain Assessment: Self-Reported Pain Score: 3 /10             Reported level is compatible with observation.       Pain Type: Chronic pain Pain Location: Neck (shoulder and right knee) Pain Descriptors / Indicators: Aching, Constant Pain Frequency: Constant  Mark [name] was last seen on 07/03/2016 for medication management. During today's appointment we reviewed Mark [name]'s chronic pain status, as well as his outpatient medication regimen. At this point, his knee pain is stable and we'll need to do any more injections. However, he has agreed to give [place] a call if the pain returns so that we can complete the Hyalgan knee injections.  The patient  reports that he does not use drugs. His body mass index is 34.21 kg/m.  Further details on both, my assessment(s), as well as the proposed treatment plan, please see below.  Controlled Substance Pharmacotherapy Assessment REMS (Risk Evaluation and Mitigation Strategy)  Analgesic:Oxycodone ER 10 mg every 12 hours (20 mg/day) + oxycodone IR 5 mg every 6 hours (20 mg/day) MME/day:60 mg/day.   [name],[name], RN  07/14/2016 11:06 AM  Sign at close encounter Nursing Pain Medication Assessment:  Safety precautions to be maintained throughout the outpatient stay will include: orient to surroundings, keep bed in low position, maintain call bell within reach at all times, provide assistance with transfer out of bed and ambulation.  Medication Inspection Compliance: Pill count  conducted under aseptic conditions, in front of the patient. Neither the pills nor the bottle was removed from the patient's sight at any time.  Once count was completed pills were immediately returned to the patient in their original bottle.  Medication #1: Oxycodone ER (OxyContin) Pill Count: 49 of 60 pills remain Bottle Appearance: Standard pharmacy container. Clearly labeled. Filled Date: 27 / 22 / 2017 Medication last intake: 07/14/16  Medication #2: Oxycodone IR Pill Count: 10 of 120 pills remain Bottle Appearance: Standard pharmacy container. Clearly labeled. Filled Date: 38 / 22 / 2017 Medication last intake:07/14/16   Pharmacokinetics: Liberation and absorption (onset of action): WNL Distribution (time to peak effect): WNL Metabolism and excretion (duration of action): WNL         Pharmacodynamics: Desired effects: Analgesia: Mark [name] reports >50% benefit. Functional ability: Patient reports that medication allows him to accomplish basic ADLs Clinically meaningful improvement in function (CMIF): Sustained CMIF goals met Perceived effectiveness: Described as relatively effective, allowing for increase in activities of daily living (ADL) Undesirable effects: Side-effects or Adverse reactions: None reported Monitoring: [place] PMP: Online review of the past 85-monthperiod conducted. Compliant with practice rules and regulations List of all UDS test(s) done:  Lab Results  Component Value Date   TOXASSSELUR FINAL 05/15/2016   [place] 02/02/2016   [place] 11/[place]2015   [place] 09[place]   Last UDS on record: [employer] Select 13  Date Value Ref Range Status  05/15/2016 FINAL  Final    Comment:    ==================================================================== TOXASSURE SELECT 13 (MW) ==================================================================== Test                             Result       Flag       Units Drug Present and Declared  for Prescription Verification   Oxycodone                      3650         EXPECTED   ng/mg creat   Noroxycodone                   3374         EXPECTED   ng/mg creat    Sources of oxycodone include scheduled prescription medications.    Noroxycodone is an expected metabolite of oxycodone. Drug Present not Declared for Prescription Verification   7-aminoclonazepam              467          UNEXPECTED ng/mg creat    7-aminoclonazepam is an expected metabolite of clonazepam. Source    of clonazepam is a scheduled prescription medication. ==================================================================== Test                      Result    Flag   Units      Ref Range   Creatinine              42               mg/dL      >=20 ==================================================================== Declared Medications:  The flagging and interpretation on this report are based on the  following declared medications.  Unexpected results may arise from  inaccuracies in the declared medications.  **Note: The testing scope of this panel includes these medications:  Oxycodone  Oxycodone (OxyContin)  **Note: The testing scope of this panel does not include following  reported medications:  Naloxone  Naproxen  Omega-3 Fatty Acids (Fish Oil)  Omeprazole (Prilosec)  Oxybutynin (Ditropan)  Sildenafil (Revatio)  Supplement (Saw Palmetto) ==================================================================== For clinical consultation, please call [phone]. ====================================================================    UDS interpretation: Compliant Patient informed of the CDC guidelines and recommendations to stay away from the concomitant use of benzodiazepines and opioids due to the increased risk of respiratory depression and death. Medication Assessment Form: Reviewed. Patient indicates being compliant with therapy Treatment compliance: Compliant Risk Assessment Profile: Aberrant  behavior: See prior evaluations. None observed or detected today Comorbid factors increasing risk of overdose: See prior notes. No additional risks detected today Risk of substance use disorder (SUD): Low Opioid Risk Tool (ORT) Total Score: 3  Interpretation Table:  Score <3 = Low Risk for SUD  Score between 4-7 = Moderate Risk for SUD  Score >8 = High Risk for Opioid Abuse   Risk Mitigation Strategies:  Patient Counseling: Covered Patient-Prescriber Agreement (PPA): Present and active  Notification to other healthcare providers: Done  Pharmacologic Plan: No change in therapy, at this time  Laboratory Chemistry  Inflammation Markers Lab Results  Component Value Date   ESRSEDRATE 6 09/01/2015   CRP 1.7 (H) 09/01/2015   Renal Function Lab Results  Component Value Date   BUN 24 (H) 12/29/2015   CREATININE 1.01 12/29/2015   GFRAA >60 09/01/2015   GFRNONAA >60 09/01/2015   Hepatic Function Lab Results  Component Value Date   AST 24 12/29/2015   ALT 23 12/29/2015   ALBUMIN 4.2 12/29/2015   Electrolytes Lab Results  Component Value Date   NA 140 12/29/2015   K 3.8 12/29/2015   CL 104 12/29/2015   CALCIUM 8.9 12/29/2015   MG 2.1 09/01/2015   Pain Modulating Vitamins Lab Results  Component Value Date   25OHVITD1 63 02/02/2016   25OHVITD2 <1.0 02/02/2016   25OHVITD3 63 02/02/2016   VITAMINB12 861 02/02/2016   Coagulation Parameters Lab Results  Component Value Date   INR 1.0 06/03/2007   LABPROT 13.7 06/03/2007   APTT 29 06/03/2007   PLT 172.0 05/07/2015   Cardiovascular Lab Results  Component Value Date   HGB 15.1 05/07/2015   HCT 44.2 05/07/2015   Note: Lab results reviewed.  Recent Diagnostic Imaging Review  Dg C-arm 1-60 Min-no Report  Result Date: 07/06/2016 Fluoroscopy was utilized by the requesting physician.  No radiographic interpretation.   Note: Imaging results reviewed.          Meds  The patient has a current medication list which  includes the following prescription(s): aspirin, atorvastatin, cetirizine, vitamin d, clindamycin, clonazepam, cranberry, desonide, fluoxetine, fluoxetine, fluticasone, furosemide, garlic, multiple vitamins-minerals, naloxone, naproxen, fish oil, omeprazole, oxycodone, oxycodone, oxycodone, oxycodone, polyethylene glycol powder, potassium citrate, saw palmetto, sildenafil, vitamin b complex-c, oxycodone, and oxycodone.  Current Outpatient Prescriptions on File Prior to Visit  Medication Sig  . aspirin 81 MG tablet Take 81 mg by mouth daily.    Marland Kitchen atorvastatin (LIPITOR) 20 MG tablet Take 20 mg by mouth daily.  . cetirizine (ZYRTEC) 10 MG tablet Take 10 mg by mouth daily. As needed  . Cholecalciferol (VITAMIN D) 2000 units CAPS Take 1 capsule by mouth daily.  . clindamycin (CLEOCIN) 150 MG capsule Take 150 mg by mouth as needed. 4 caps 1 hour before dental procedures.   . clonazePAM (KLONOPIN) 1 MG tablet TAKE 1 BY MOUTH 3 TIMES DAILY AS NEEDED FOR ANXIETY  . CRANBERRY PO Take 4,200 mg by mouth daily.   Marland Kitchen desonide (DESOWEN) 0.05 % ointment Apply topically 2 (two) times daily. (Patient taking differently: Apply topically as needed. )  .  FLUoxetine (PROZAC) 20 MG capsule TAKE 1 BY MOUTH DAILY  . FLUoxetine (PROZAC) 40 MG capsule TAKE 1 BY MOUTH DAILY  . fluticasone (FLONASE) 50 MCG/ACT nasal spray Place 2 sprays into both nostrils daily.  . furosemide (LASIX) 20 MG tablet TAKE 1 BY MOUTH DAILY  . Garlic 7253 MG CAPS Take 1 capsule by mouth daily.    . Multiple Vitamin (MULTIVITAMIN PO) Take 1 tablet by mouth daily.    . Naloxone HCl (NARCAN) 4 MG/0.1ML LIQD Spray into one nostril. Repeat with second device into other nostril after 2-3 minutes if no or minimal response.  . naproxen (NAPROSYN) 500 MG tablet TAKE 1 BY MOUTH TWICE DAILY WITH A MEAL  . Omega-3 Fatty Acids (FISH OIL) 1200 MG CAPS Take 2,400 mg by mouth 2 (two) times daily.   Marland Kitchen omeprazole (PRILOSEC) 20 MG capsule TAKE 1 BY MOUTH DAILY  .  polyethylene glycol powder (MIRALAX) powder Take 1 Container by mouth every other day.  . potassium citrate (UROCIT-K) 10 MEQ (1080 MG) SR tablet Take 10 mEq by mouth 2 (two) times daily.  . Saw Palmetto 450 MG CAPS Take 900 mg by mouth daily.   . sildenafil (REVATIO) 20 MG tablet 3-5 tablets daily as needed  . VITAMIN B COMPLEX-C CAPS Take by mouth daily.    No current facility-administered medications on file prior to visit.    ROS  Constitutional: Denies any fever or chills Gastrointestinal: No reported hemesis, hematochezia, vomiting, or acute GI distress Musculoskeletal: Denies any acute onset joint swelling, redness, loss of ROM, or weakness Neurological: No reported episodes of acute onset apraxia, aphasia, dysarthria, agnosia, amnesia, paralysis, loss of coordination, or loss of consciousness  Allergies  Mark [name] is allergic to ciprofloxacin; metformin; penicillin v potassium; penicillins; buprenorphine hcl; and morphine and related.  Exira  Drug: Mark [name]  reports that he does not use drugs. Alcohol:  reports that he does not drink alcohol. Tobacco:  reports that he has quit smoking. He has never used smokeless tobacco. Medical:  has a past medical history of Anxiety; Chronic pain; Depression; History of kidney stones (April 12,2017); History of permanent cardiac pacemaker placement (05/31/2015); Kidney stones; Migraine; Prostatitis; and Sleep apnea. Family: family history includes Arthritis in his father and mother; Cancer in his father and mother; Diabetes in his mother; Heart block in his father; Heart disease in his father and mother.  Past Surgical History:  Procedure Laterality Date  . CARDIOVASCULAR STRESS TEST  2014   Dr Ubaldo Glassing  . CARPAL TUNNEL RELEASE Bilateral 2006  . CERVICAL SPINE SURGERY  2008   C5/6  . COLONOSCOPY  07/2007   WNL Tiffany Kocher)  . LITHOTRIPSY Bilateral 2012   Stoioff  . LITHOTRIPSY Left 2017   x2 Stoioff  . PACEMAKER PLACEMENT  2009   s/p  Medtronic Adapta DR Pacemaker (Dr. Ubaldo Glassing)  . ROTATOR CUFF REPAIR Left 2009  . TONSILLECTOMY  1964  . ULNAR NERVE REPAIR  2008   Left   Constitutional Exam  General appearance: Well nourished, well developed, and well hydrated. In no apparent acute distress Vitals:   07/14/16 1055  BP: 132/90  Pulse: 78  Resp: 16  Temp: 98.1 F (36.7 C)  TempSrc: Oral  SpO2: 99%  Weight: 225 lb (102.1 kg)  Height: 5' 8"  (1.727 m)   BMI Assessment: Estimated body mass index is 34.21 kg/m as calculated from the following:   Height as of this encounter: 5' 8"  (1.727 m).   Weight as of  this encounter: 225 lb (102.1 kg).  BMI interpretation table: BMI level Category Range association with higher incidence of chronic pain  <18 kg/m2 Underweight   18.5-24.9 kg/m2 Ideal body weight   25-29.9 kg/m2 Overweight Increased incidence by 20%  30-34.9 kg/m2 Obese (Class I) Increased incidence by 68%  35-39.9 kg/m2 Severe obesity (Class II) Increased incidence by 136%  >40 kg/m2 Extreme obesity (Class III) Increased incidence by 254%   BMI Readings from Last 4 Encounters:  07/14/16 34.21 kg/m  07/03/16 34.21 kg/m  06/21/16 34.06 kg/m  06/06/16 34.06 kg/m   Wt Readings from Last 4 Encounters:  07/14/16 225 lb (102.1 kg)  07/03/16 225 lb (102.1 kg)  06/21/16 224 lb (101.6 kg)  06/06/16 224 lb (101.6 kg)  Psych/Mental status: Alert, oriented x 3 (person, place, & time) Eyes: PERLA Respiratory: No evidence of acute respiratory distress  Cervical Spine Exam  Inspection: No masses, redness, or swelling Alignment: Symmetrical Functional ROM: Unrestricted ROM Stability: No instability detected Muscle strength & Tone: Functionally intact Sensory: Unimpaired Palpation: Non-contributory  Upper Extremity (UE) Exam    Side: Right upper extremity  Side: Left upper extremity  Inspection: No masses, redness, swelling, or asymmetry  Inspection: No masses, redness, swelling, or asymmetry  Functional ROM:  Unrestricted ROM          Functional ROM: Unrestricted ROM          Muscle strength & Tone: Functionally intact  Muscle strength & Tone: Functionally intact  Sensory: Unimpaired  Sensory: Unimpaired  Palpation: Non-contributory  Palpation: Non-contributory   Thoracic Spine Exam  Inspection: No masses, redness, or swelling Alignment: Symmetrical Functional ROM: Unrestricted ROM Stability: No instability detected Sensory: Unimpaired Muscle strength & Tone: Functionally intact Palpation: Non-contributory  Lumbar Spine Exam  Inspection: No masses, redness, or swelling Alignment: Symmetrical Functional ROM: Unrestricted ROM Stability: No instability detected Muscle strength & Tone: Functionally intact Sensory: Unimpaired Palpation: Non-contributory Provocative Tests: Lumbar Hyperextension and rotation test: evaluation deferred today       Patrick's Maneuver: evaluation deferred today              Gait & Posture Assessment  Ambulation: Unassisted Gait: Relatively normal for age and body habitus Posture: WNL   Lower Extremity Exam    Side: Right lower extremity  Side: Left lower extremity  Inspection: No masses, redness, swelling, or asymmetry  Inspection: No masses, redness, swelling, or asymmetry  Functional ROM: Unrestricted ROM          Functional ROM: Unrestricted ROM          Muscle strength & Tone: Functionally intact  Muscle strength & Tone: Functionally intact  Sensory: Unimpaired  Sensory: Unimpaired  Palpation: Non-contributory  Palpation: Non-contributory   Assessment  Primary Diagnosis & Pertinent Problem List: The primary encounter diagnosis was Chronic neck pain (Location of Primary Source of Pain) (Bilateral) (L>R). Diagnoses of Chronic pain syndrome, Chronic knee pain (Bilateral) (R>L), Primary osteoarthritis of both knees, Long term current use of opiate analgesic, Long term prescription opiate use, and Opiate use (60 MME/Day) were also pertinent to this  visit.  Visit Diagnosis: 1. Chronic neck pain (Location of Primary Source of Pain) (Bilateral) (L>R)   2. Chronic pain syndrome   3. Chronic knee pain (Bilateral) (R>L)   4. Primary osteoarthritis of both knees   5. Long term current use of opiate analgesic   6. Long term prescription opiate use   7. Opiate use (60 MME/Day)    Plan of Care  Pharmacotherapy (  Medications Ordered): Meds ordered this encounter  Medications  . oxyCODONE (OXYCONTIN) 10 mg 12 hr tablet    Sig: Take 1 tablet (10 mg total) by mouth every 12 (twelve) hours.    Dispense:  60 tablet    Refill:  0    Do not add this medication to the electronic "Automatic Refill" notification system. Patient may have prescription filled one day early if pharmacy is closed on scheduled refill date. Do not fill until: 10/26/16 To last until: 11/25/16  . oxyCODONE (OXY IR/ROXICODONE) 5 MG immediate release tablet    Sig: Take 1 tablet (5 mg total) by mouth every 6 (six) hours as needed for severe pain.    Dispense:  120 tablet    Refill:  0    Do not add this medication to the electronic "Automatic Refill" notification system. Patient may have prescription filled one day early if pharmacy is closed on scheduled refill date. Do not fill until: 10/26/16 To last until: 11/25/16  . oxyCODONE (OXY IR/ROXICODONE) 5 MG immediate release tablet    Sig: Take 1 tablet (5 mg total) by mouth every 6 (six) hours as needed for severe pain.    Dispense:  120 tablet    Refill:  0    Do not add this medication to the electronic "Automatic Refill" notification system. Patient may have prescription filled one day early if pharmacy is closed on scheduled refill date. Do not fill until: 08/27/16 To last until: 09/26/16  . oxyCODONE (OXY IR/ROXICODONE) 5 MG immediate release tablet    Sig: Take 1 tablet (5 mg total) by mouth every 6 (six) hours as needed for severe pain.    Dispense:  120 tablet    Refill:  0    Do not add this medication to the  electronic "Automatic Refill" notification system. Patient may have prescription filled one day early if pharmacy is closed on scheduled refill date. Do not fill until: 09/26/16 To last until: 10/26/16  . oxyCODONE (OXYCONTIN) 10 mg 12 hr tablet    Sig: Take 1 tablet (10 mg total) by mouth every 12 (twelve) hours.    Dispense:  60 tablet    Refill:  0    Do not add this medication to the electronic "Automatic Refill" notification system. Patient may have prescription filled one day early if pharmacy is closed on scheduled refill date. Do not fill until: 08/27/16 To last until: 09/26/16  . oxyCODONE (OXYCONTIN) 10 mg 12 hr tablet    Sig: Take 1 tablet (10 mg total) by mouth every 12 (twelve) hours.    Dispense:  60 tablet    Refill:  0    Do not add this medication to the electronic "Automatic Refill" notification system. Patient may have prescription filled one day early if pharmacy is closed on scheduled refill date. Do not fill until: 09/26/16 To last until: 10/26/16   New Prescriptions   No medications on file   Medications administered today: Mark [name] had no medications administered during this visit. Lab-work, procedure(s), and/or referral(s): No orders of the defined types were placed in this encounter.  Imaging and/or referral(s): None  Interventional therapies: Planned, scheduled, and/or pending:   None at this time.    Considering:   Complete a series of 5 Hyalgan intra-articular knee injections, bilaterally.  Diagnostic bilateral cervical facet block under fluoroscopic guidance and IV sedation.  Possible bilateral cervical facet radiofrequency ablation under fluoroscopic guidance and IV sedation.  Diagnostic left sided cervical epidural steroid  injection under fluoroscopic guidance, with or without sedation.  Diagnostic bilateral intra-articular knee injection, without fluoroscopy or sedation.  Diagnostic bilateral genicular nerve blocks under fluoroscopic guidance,  with or without sedation.  Possible bilateral genicular nerve radiofrequency ablation under fluoroscopic guidance and IV sedation.  Diagnostic bilateral sacroiliac joint block under fluoroscopic guidance, with or without sedation.  Possible bilateral sacroiliac joint radiofrequency ablation under fluoroscopic guidance and IV sedation.  Diagnostic bilateral intra-articular shoulder joint injection under fluoroscopic guidance, with or without sedation.  Diagnostic bilateral suprascapular nerve block under fluoroscopic guidance, with a without sedation.  Possible bilateral suprascapular nerve radiofrequency ablation under fluoroscopic guidance and IV sedation.  Diagnostic bilateral lumbar facet block under fluoroscopic guidance and IV sedation.  Possible bilateral lumbar facet radiofrequency ablation under fluoroscopic guidance and IV sedation.  Diagnostic left L4-5 lumbar epidural steroid injection under fluoroscopic guidance, with or without sedation.    Palliative PRN treatment(s):   Bilateral intra-articular Hyalgan knee injection series (injection #4)  Diagnostic bilateral cervical facet block under fluoroscopic guidance and IV sedation.  Diagnostic left sided cervical epidural steroid injection under fluoroscopic guidance, with or without sedation.  Diagnostic bilateral intra-articular knee injection, without fluoroscopy or sedation.  Diagnostic bilateral genicular nerve blocks under fluoroscopic guidance, with or without sedation.  Diagnostic bilateral sacroiliac joint block under fluoroscopic guidance, with or without sedation.  Diagnostic bilateral intra-articular shoulder joint injection under fluoroscopic guidance, with or without sedation.  Diagnostic bilateral suprascapular nerve block under fluoroscopic guidance, with a without sedation.  Diagnostic bilateral lumbar facet block under fluoroscopic guidance and IV sedation.  Diagnostic left L4-5 lumbar epidural steroid injection under  fluoroscopic guidance, with or without sedation.    Provider-requested follow-up: Return in about 4 months (around 11/07/2016) for Med-Mgmt.  Future Appointments Date Time Provider [place]  08/04/2016 9:40 AM [facility]  08/04/2016 9:45 AM Eustace Pen, LPN [facility]  08/11/2016 3:00 PM Ria Bush, MD [facility]  10/26/2016 1:15 PM Milinda Pointer, MD [facility] None   Primary Care Physician: Ria Bush, MD Location: [facility] Note by: Kathlen Brunswick. Dossie Arbour, M.D, DABA, DABAPM, DABPM, DABIPP, FIPP Date: 07/14/16; Time: 1:39 PM  Pain Score Disclaimer: We use the NRS-11 scale. This is a self-reported, subjective measurement of pain severity with only modest accuracy. It is used primarily to identify changes within a particular patient. It must be understood that outpatient pain scales are significantly less accurate that those used for research, where they can be applied under ideal controlled circumstances with minimal exposure to variables. In reality, the score is likely to be a combination of pain intensity and pain affect, where pain affect describes the degree of emotional arousal or changes in action readiness caused by the sensory experience of pain. Factors such as social and work situation, setting, emotional state, anxiety levels, expectation, and prior pain experience may influence pain perception and show large inter-individual differences that may also be affected by time variables.  Patient instructions provided during this appointment: Patient Instructions  Knee Injection A knee injection is a procedure to get medicine into your knee joint. Your health care provider puts a needle into the joint and injects medicine with an attached syringe. The injected medicine may relieve the pain, swelling, and stiffness of arthritis. The injected medicine may also help to lubricate and cushion  your knee joint. You may need more than one injection. Tell a health care provider about:  Any allergies you have.  All medicines you are taking,  including vitamins, herbs, eye drops, creams, and over-the-counter medicines.  Any problems you or family members have had with anesthetic medicines.  Any blood disorders you have.  Any surgeries you have had.  Any medical conditions you have. What are the risks? Generally, this is a safe procedure. However, problems may occur, including:  Infection.  Bleeding.  Worsening symptoms.  Damage to the area around your knee.  Allergic reaction to any of the medicines.  Skin reactions from repeated injections. What happens before the procedure?  Ask your health care provider about changing or stopping your regular medicines. This is especially important if you are taking diabetes medicines or blood thinners.  Plan to have someone take you home after the procedure. What happens during the procedure?  You will sit or lie down in a position for your knee to be treated.  The skin over your kneecap will be cleaned with a germ-killing solution (antiseptic).  You will be given a medicine that numbs the area (local anesthetic). You may feel some stinging.  After your knee becomes numb, you will have a second injection. This is the medicine. This needle is carefully placed between your kneecap and your knee. The medicine is injected into the joint space.  At the end of the procedure, the needle will be removed.  A bandage (dressing) may be placed over the injection site. The procedure may vary among health care providers and hospitals. What happens after the procedure?  You may have to move your knee through its full range of motion. This helps to get all of the medicine into your joint space.  Your blood pressure, heart rate, breathing rate, and blood oxygen level will be monitored often until the medicines you were given have worn  off.  You will be watched to make sure that you do not have a reaction to the injected medicine. This information is not intended to replace advice given to you by your health care provider. Make sure you discuss any questions you have with your health care provider. Document Released: 10/15/2006 Document Revised: 12/24/2015 Document Reviewed: 06/03/2014 Elsevier Interactive Patient Education  2017 [employer].

## 2016-07-14 NOTE — Patient Instructions (Signed)
Knee Injection A knee injection is a procedure to get medicine into your knee joint. Your health care provider puts a needle into the joint and injects medicine with an attached syringe. The injected medicine may relieve the pain, swelling, and stiffness of arthritis. The injected medicine may also help to lubricate and cushion your knee joint. You may need more than one injection. Tell a health care provider about:  Any allergies you have.  All medicines you are taking, including vitamins, herbs, eye drops, creams, and over-the-counter medicines.  Any problems you or family members have had with anesthetic medicines.  Any blood disorders you have.  Any surgeries you have had.  Any medical conditions you have. What are the risks? Generally, this is a safe procedure. However, problems may occur, including:  Infection.  Bleeding.  Worsening symptoms.  Damage to the area around your knee.  Allergic reaction to any of the medicines.  Skin reactions from repeated injections. What happens before the procedure?  Ask your health care provider about changing or stopping your regular medicines. This is especially important if you are taking diabetes medicines or blood thinners.  Plan to have someone take you home after the procedure. What happens during the procedure?  You will sit or lie down in a position for your knee to be treated.  The skin over your kneecap will be cleaned with a germ-killing solution (antiseptic).  You will be given a medicine that numbs the area (local anesthetic). You may feel some stinging.  After your knee becomes numb, you will have a second injection. This is the medicine. This needle is carefully placed between your kneecap and your knee. The medicine is injected into the joint space.  At the end of the procedure, the needle will be removed.  A bandage (dressing) may be placed over the injection site. The procedure may vary among health care  providers and hospitals. What happens after the procedure?  You may have to move your knee through its full range of motion. This helps to get all of the medicine into your joint space.  Your blood pressure, heart rate, breathing rate, and blood oxygen level will be monitored often until the medicines you were given have worn off.  You will be watched to make sure that you do not have a reaction to the injected medicine. This information is not intended to replace advice given to you by your health care provider. Make sure you discuss any questions you have with your health care provider. Document Released: 10/15/2006 Document Revised: 12/24/2015 Document Reviewed: 06/03/2014 Elsevier Interactive Patient Education  2017 [employer]  

## 2016-07-25 ENCOUNTER — Other Ambulatory Visit: Payer: Self-pay

## 2016-07-25 MED ORDER — FUROSEMIDE 20 MG PO TABS: ORAL_TABLET | ORAL | 3 refills | Status: DC

## 2016-07-25 MED ORDER — OMEPRAZOLE 20 MG PO CPDR: DELAYED_RELEASE_CAPSULE | ORAL | 3 refills | Status: DC

## 2016-07-25 NOTE — Telephone Encounter (Signed)
Last refill 07/21/15 #90 +3, last OV 04/11/16. OK TO REFILL?

## 2016-07-25 NOTE — Telephone Encounter (Signed)
plz verify dosing. Refill request was for 20mg  daily but I have him taking 40mg  daily.

## 2016-07-26 MED ORDER — FLUOXETINE HCL 20 MG PO CAPS: ORAL_CAPSULE | ORAL | 0 refills | Status: DC

## 2016-07-28 ENCOUNTER — Other Ambulatory Visit: Payer: Self-pay

## 2016-07-28 MED ORDER — ATORVASTATIN CALCIUM 20 MG PO TABS: 20.0000 mg | ORAL_TABLET | Freq: Every day | ORAL | 3 refills | Status: DC

## 2016-07-28 NOTE — Telephone Encounter (Signed)
Rx sent electronically.  

## 2016-08-01 ENCOUNTER — Other Ambulatory Visit: Payer: Self-pay | Admitting: *Deleted

## 2016-08-01 MED ORDER — FLUOXETINE HCL 40 MG PO CAPS: ORAL_CAPSULE | ORAL | 0 refills | Status: DC

## 2016-08-03 ENCOUNTER — Encounter: Payer: Medicare Other | Admitting: Pain Medicine

## 2016-08-04 ENCOUNTER — Other Ambulatory Visit: Payer: Self-pay | Admitting: Family Medicine

## 2016-08-04 ENCOUNTER — Ambulatory Visit (INDEPENDENT_AMBULATORY_CARE_PROVIDER_SITE_OTHER): Payer: Medicare Other

## 2016-08-04 ENCOUNTER — Other Ambulatory Visit: Payer: Medicare Other

## 2016-08-04 VITALS — BP 118/78 | HR 68 | Temp 98.9°F | Ht 67.0 in | Wt 223.5 lb

## 2016-08-04 DIAGNOSIS — Z0000 Encounter for general adult medical examination without abnormal findings: Secondary | ICD-10-CM

## 2016-08-04 DIAGNOSIS — Z125 Encounter for screening for malignant neoplasm of prostate: Secondary | ICD-10-CM | POA: Diagnosis not present

## 2016-08-04 DIAGNOSIS — E785 Hyperlipidemia, unspecified: Secondary | ICD-10-CM

## 2016-08-04 DIAGNOSIS — D696 Thrombocytopenia, unspecified: Secondary | ICD-10-CM

## 2016-08-04 LAB — CBC WITH DIFFERENTIAL/PLATELET
Basophils Absolute: 0.1 10*3/uL (ref 0.0–0.1)
Basophils Relative: 0.7 % (ref 0.0–3.0)
Eosinophils Absolute: 0.2 10*3/uL (ref 0.0–0.7)
Eosinophils Relative: 2.4 % (ref 0.0–5.0)
HCT: 42 % (ref 39.0–52.0)
Hemoglobin: 14.9 g/dL (ref 13.0–17.0)
Lymphocytes Relative: 24 % (ref 12.0–46.0)
Lymphs Abs: 2.2 10*3/uL (ref 0.7–4.0)
MCHC: 35.4 g/dL (ref 30.0–36.0)
MCV: 88.3 fl (ref 78.0–100.0)
Monocytes Absolute: 1.1 10*3/uL — ABNORMAL HIGH (ref 0.1–1.0)
Monocytes Relative: 11.8 % (ref 3.0–12.0)
Neutro Abs: 5.6 10*3/uL (ref 1.4–7.7)
Neutrophils Relative %: 61.1 % (ref 43.0–77.0)
Platelets: 164 10*3/uL (ref 150.0–400.0)
RBC: 4.76 Mil/uL (ref 4.22–5.81)
RDW: 13.6 % (ref 11.5–15.5)
WBC: 9.1 10*3/uL (ref 4.0–10.5)

## 2016-08-04 LAB — LIPID PANEL
Cholesterol: 104 mg/dL (ref 0–200)
HDL: 34.9 mg/dL — ABNORMAL LOW (ref >39.00)
LDL Cholesterol: 41 mg/dL (ref 0–99)
NonHDL: 69.1
Total CHOL/HDL Ratio: 3
Triglycerides: 140 mg/dL (ref 0.0–149.0)
VLDL: 28 mg/dL (ref 0.0–40.0)

## 2016-08-04 LAB — BASIC METABOLIC PANEL
BUN: 18 mg/dL (ref 6–23)
CO2: 31 mEq/L (ref 19–32)
Calcium: 9.1 mg/dL (ref 8.4–10.5)
Chloride: 102 mEq/L (ref 96–112)
Creatinine, Ser: 1.08 mg/dL (ref 0.40–1.50)
GFR: 74.23 mL/min (ref >60.00)
Glucose, Bld: 120 mg/dL — ABNORMAL HIGH (ref 70–99)
Potassium: 4 mEq/L (ref 3.5–5.1)
Sodium: 140 mEq/L (ref 135–145)

## 2016-08-04 LAB — PSA: PSA: 1.1 ng/mL (ref 0.10–4.00)

## 2016-08-04 NOTE — Progress Notes (Signed)
PCP notes:   Health maintenance:  HIV screening - per pt, screening completed in 2014  Abnormal screenings:   Hearing - failed Mini-Cog score: 18/20  Patient concerns:    None  Nurse concerns:  None  Next PCP appt:   08/11/16 @ 1500

## 2016-08-04 NOTE — Progress Notes (Signed)
Pre visit review using our clinic review tool, if applicable. No additional management support is needed unless otherwise documented below in the visit note. 

## 2016-08-04 NOTE — Patient Instructions (Signed)
Mark [name] , Thank you for taking time to come for your Medicare Wellness Visit. I appreciate your ongoing commitment to your health goals. Please review the following plan we discussed and let me know if I can assist you in the future.   These are the goals we discussed: Goals    . Increase water intake          Starting 08/04/2016, I will continue to drink at least 4-6 glasses of water daily.        This is a list of the screening recommended for you and due dates:  Health Maintenance  Topic Date Due  . Colon Cancer Screening  08/04/2017  . DTaP/Tdap/Td vaccine (2 - Td) 11/20/2019  . Tetanus Vaccine  11/20/2019  . Flu Shot  Completed  .  Hepatitis C: One time screening is recommended by Center for Disease Control  (CDC) for  adults born from [birth year] through 1964.   Completed  . HIV Screening  Addressed   Preventive Care for Adults  A healthy lifestyle and preventive care can promote health and wellness. Preventive health guidelines for adults include the following key practices.  . A routine yearly physical is a good way to check with your health care provider about your health and preventive screening. It is a chance to share any concerns and updates on your health and to receive a thorough exam.  . Visit your dentist for a routine exam and preventive care every 6 months. Brush your teeth twice a day and floss once a day. Good oral hygiene prevents tooth decay and gum disease.  . The frequency of eye exams is based on your age, health, family medical history, use  of contact lenses, and other factors. Follow your health care provider's ecommendations for frequency of eye exams.  . Eat a healthy diet. Foods like vegetables, fruits, whole grains, low-fat dairy products, and lean protein foods contain the nutrients you need without too many calories. Decrease your intake of foods high in solid fats, added sugars, and salt. Eat the right amount of calories for you. Get information about  a proper diet from your health care provider, if necessary.  . Regular physical exercise is one of the most important things you can do for your health. Most adults should get at least 150 minutes of moderate-intensity exercise (any activity that increases your heart rate and causes you to sweat) each week. In addition, most adults need muscle-strengthening exercises on 2 or more days a week.  Silver Sneakers may be a benefit available to you. To determine eligibility, you may visit the website: www.silversneakers.com or contact program at [phone] Mon-Fri between 8AM-8PM.   . Maintain a healthy weight. The body mass index (BMI) is a screening tool to identify possible weight problems. It provides an estimate of body fat based on height and weight. Your health care provider can find your BMI and can help you achieve or maintain a healthy weight.   For adults 20 years and older: ? A BMI below 18.5 is considered underweight. ? A BMI of 18.5 to 24.9 is normal. ? A BMI of 25 to 29.9 is considered overweight. ? A BMI of 30 and above is considered obese.   . Maintain normal blood lipids and cholesterol levels by exercising and minimizing your intake of saturated fat. Eat a balanced diet with plenty of fruit and vegetables. Blood tests for lipids and cholesterol should begin at age 59 and be repeated every 5 years. If  your lipid or cholesterol levels are high, you are over 50, or you are at high risk for heart disease, you may need your cholesterol levels checked more frequently. Ongoing high lipid and cholesterol levels should be treated with medicines if diet and exercise are not working.  . If you smoke, find out from your health care provider how to quit. If you do not use tobacco, please do not start.  . If you choose to drink alcohol, please do not consume more than 2 drinks per day. One drink is considered to be 12 ounces (355 mL) of beer, 5 ounces (148 mL) of wine, or 1.5 ounces (44 mL) of  liquor.  . If you are 22-53 years old, ask your health care provider if you should take aspirin to prevent strokes.  . Use sunscreen. Apply sunscreen liberally and repeatedly throughout the day. You should seek shade when your shadow is shorter than you. Protect yourself by wearing long sleeves, pants, a wide-brimmed hat, and sunglasses year round, whenever you are outdoors.  . Once a month, do a whole body skin exam, using a mirror to look at the skin on your back. Tell your health care provider of new moles, moles that have irregular borders, moles that are larger than a pencil eraser, or moles that have changed in shape or color.

## 2016-08-04 NOTE — Progress Notes (Signed)
Subjective:   Mark [name] is a 60 y.o. male who presents for Medicare Annual/Subsequent preventive examination.  Review of Systems:  N/A Cardiac Risk Factors include: advanced age (>35men, >74 women);male gender;obesity (BMI >30kg/m2);dyslipidemia     Objective:    Vitals: BP 118/78 (BP Location: Left Arm, Patient Position: Sitting, Cuff Size: Normal)   Pulse 68   Temp 98.9 F (37.2 C) (Oral)   Ht 5\' 7"  (1.702 m) Comment: no shoes  Wt 223 lb 8 oz (101.4 kg)   SpO2 95%   BMI 35.01 kg/m   Body mass index is 35.01 kg/m.  Tobacco History  Smoking Status  . Former Smoker  Smokeless Tobacco  . Never Used     Counseling given: No   Past Medical History:  Diagnosis Date  . Anxiety   . Chronic pain   . Depression   . History of kidney stones April 12,2017   removed two kidney stones about 1 cm in size  . History of permanent cardiac pacemaker placement 05/31/2015  . Kidney stones   . Migraine   . Prostatitis   . Sleep apnea    Past Surgical History:  Procedure Laterality Date  . CARDIOVASCULAR STRESS TEST  2014   Dr Lady Gary  . CARPAL TUNNEL RELEASE Bilateral 2006  . CERVICAL SPINE SURGERY  2008   C5/6  . COLONOSCOPY  07/2007   WNL Markham Jordan)  . LITHOTRIPSY Bilateral 2012   Stoioff  . LITHOTRIPSY Left 2017   x2 Stoioff  . PACEMAKER PLACEMENT  2009   s/p Medtronic Adapta DR Pacemaker (Dr. Lady Gary)  . ROTATOR CUFF REPAIR Left 2009  . TONSILLECTOMY  1964  . ULNAR NERVE REPAIR  2008   Left   Family History  Problem Relation Age of Onset  . Heart disease Mother   . Diabetes Mother   . Arthritis Mother   . Cancer Mother     Breast Cancer  . Heart block Father   . Heart disease Father   . Arthritis Father   . Cancer Father     skin cancer   History  Sexual Activity  . Sexual activity: No    Outpatient Encounter Prescriptions as of 08/04/2016  Medication Sig  . aspirin 81 MG tablet Take 81 mg by mouth daily.    Marland Kitchen atorvastatin (LIPITOR) 20 MG tablet  Take 1 tablet (20 mg total) by mouth daily.  . cetirizine (ZYRTEC) 10 MG tablet Take 10 mg by mouth daily. As needed  . Cholecalciferol (VITAMIN D) 2000 units CAPS Take 1 capsule by mouth daily.  . clindamycin (CLEOCIN) 150 MG capsule Take 150 mg by mouth as needed. 4 caps 1 hour before dental procedures.   . clonazePAM (KLONOPIN) 1 MG tablet TAKE 1 BY MOUTH 3 TIMES DAILY AS NEEDED FOR ANXIETY  . CRANBERRY PO Take 4,200 mg by mouth daily.   Marland Kitchen desonide (DESOWEN) 0.05 % ointment Apply topically 2 (two) times daily. (Patient taking differently: Apply topically as needed. )  . FLUoxetine (PROZAC) 20 MG capsule TAKE 1 BY MOUTH DAILY  . FLUoxetine (PROZAC) 40 MG capsule TAKE 1 BY MOUTH DAILY  . fluticasone (FLONASE) 50 MCG/ACT nasal spray Place 2 sprays into both nostrils daily.  . furosemide (LASIX) 20 MG tablet TAKE 1 BY MOUTH DAILY  . Garlic 1000 MG CAPS Take 1 capsule by mouth daily.    . Multiple Vitamin (MULTIVITAMIN PO) Take 1 tablet by mouth daily.    . Naloxone HCl (NARCAN) 4 MG/0.1ML  LIQD Spray into one nostril. Repeat with second device into other nostril after 2-3 minutes if no or minimal response.  . naproxen (NAPROSYN) 500 MG tablet TAKE 1 BY MOUTH TWICE DAILY WITH A MEAL  . Omega-3 Fatty Acids (FISH OIL) 1200 MG CAPS Take 1,200 mg by mouth daily.   Marland Kitchen. omeprazole (PRILOSEC) 20 MG capsule TAKE 1 BY MOUTH DAILY  . [START ON 10/26/2016] oxyCODONE (OXY IR/ROXICODONE) 5 MG immediate release tablet Take 1 tablet (5 mg total) by mouth every 6 (six) hours as needed for severe pain.  Mark [name]. [START ON 08/27/2016] oxyCODONE (OXY IR/ROXICODONE) 5 MG immediate release tablet Take 1 tablet (5 mg total) by mouth every 6 (six) hours as needed for severe pain.  Mark [name]. [START ON 09/26/2016] oxyCODONE (OXY IR/ROXICODONE) 5 MG immediate release tablet Take 1 tablet (5 mg total) by mouth every 6 (six) hours as needed for severe pain.  Mark [name]. [START ON 10/26/2016] oxyCODONE (OXYCONTIN) 10 mg 12 hr tablet Take 1 tablet (10 mg  total) by mouth every 12 (twelve) hours.  Mark [name]. [START ON 08/27/2016] oxyCODONE (OXYCONTIN) 10 mg 12 hr tablet Take 1 tablet (10 mg total) by mouth every 12 (twelve) hours.  Mark [name]. [START ON 09/26/2016] oxyCODONE (OXYCONTIN) 10 mg 12 hr tablet Take 1 tablet (10 mg total) by mouth every 12 (twelve) hours.  . polyethylene glycol powder (MIRALAX) powder Take 1 Container by mouth as needed.   . potassium citrate (UROCIT-K) 10 MEQ (1080 MG) SR tablet Take 10 mEq by mouth 2 (two) times daily.  . Saw Palmetto, Serenoa repens, (SAW PALMETTO PO) Take 450 mg by mouth daily.   . sildenafil (REVATIO) 20 MG tablet 3-5 tablets daily as needed  . VITAMIN B COMPLEX-C CAPS Take by mouth daily.   . [DISCONTINUED] Saw Palmetto 450 MG CAPS Take 900 mg by mouth daily.    No facility-administered encounter medications on file as of 08/04/2016.     Activities of Daily Living In your present state of health, do you have any difficulty performing the following activities: 08/04/2016  Hearing? N  Vision? N  Difficulty concentrating or making decisions? N  Walking or climbing stairs? Y  Dressing or bathing? N  Doing errands, shopping? N  Preparing Food and eating ? N  Using the Toilet? N  In the past six months, have you accidently leaked urine? N  Do you have problems with loss of bowel control? N  Managing your Medications? N  Managing your Finances? N  Housekeeping or managing your Housekeeping? N  Some recent data might be hidden    Patient Care Team: Eustaquio BoydenJavier Gutierrez, MD as PCP - General (Family Medicine)   Assessment:     Hearing Screening   125Hz  250Hz  500Hz  1000Hz  2000Hz  3000Hz  4000Hz  6000Hz  8000Hz   Right ear:   0 0 40  40    Left ear:   0 0 40  0      Visual Acuity Screening   Right eye Left eye Both eyes  Without correction:     With correction: 20/70 20/30-1 20/30-1    Exercise Activities and Dietary recommendations Current Exercise Habits: The patient does not participate in regular exercise at  present, Exercise limited by: orthopedic condition(s)  Goals    . Increase water intake          Starting 08/04/2016, I will continue to drink at least 4-6 glasses of water daily.       Fall Risk Fall Risk  08/04/2016 07/14/2016 07/03/2016 06/21/2016 06/06/2016  Falls in the past year? No No No No No   Depression Screen PHQ 2/9 Scores 08/04/2016 07/14/2016 07/03/2016 06/21/2016  PHQ - 2 Score 0 0 0 0  Exception Documentation - - - -    Cognitive Function MMSE - Mini Mental State Exam 08/04/2016  Orientation to time 5  Orientation to Place 5  Registration 3  Attention/ Calculation 0  Recall 1  Recall-comments pt was unable to recall 2 of 3 words  Language- name 2 objects 0  Language- repeat 1  Language- follow 3 step command 3  Language- read & follow direction 0  Write a sentence 0  Copy design 0  Total score 18     PLEASE NOTE: A Mini-Cog screen was completed. Maximum score is 20. A value of 0 denotes this part of Folstein MMSE was not completed or the patient failed this part of the Mini-Cog screening.   Mini-Cog Screening Orientation to Time - Max 5 pts Orientation to Place - Max 5 pts Registration - Max 3 pts Recall - Max 3 pts Language Repeat - Max 1 pts Language Follow 3 Step Command - Max 3 pts     Immunization History  Administered Date(s) Administered  . Influenza,inj,Quad PF,36+ Mos 04/22/2013, 04/28/2014, 05/07/2015, 04/11/2016  . Pneumococcal Polysaccharide-23 05/30/2006  . Tdap 11/19/2009   Screening Tests Health Maintenance  Topic Date Due  . COLONOSCOPY  08/04/2017  . DTaP/Tdap/Td (2 - Td) 11/20/2019  . TETANUS/TDAP  11/20/2019  . INFLUENZA VACCINE  Completed  . Hepatitis C Screening  Completed  . HIV Screening  Addressed      Plan:     I have personally reviewed and addressed the Medicare Annual Wellness questionnaire and have noted the following in the patient's chart:  A. Medical and social history B. Use of alcohol, tobacco or  illicit drugs  C. Current medications and supplements D. Functional ability and status E.  Nutritional status F.  Physical activity G. Advance directives H. List of other physicians I.  Hospitalizations, surgeries, and ER visits in previous 12 months J.  Vitals K. Screenings to include hearing, vision, cognitive, depression L. Referrals and appointments - none  In addition, I have reviewed and discussed with patient certain preventive protocols, quality metrics, and best practice recommendations. A written personalized care plan for preventive services as well as general preventive health recommendations were provided to patient.  See attached scanned questionnaire for additional information.   Signed,   Randa EvensLesia Kortlyn Koltz, MHA, BS, LPN Health Coach

## 2016-08-07 NOTE — Progress Notes (Signed)
I reviewed health advisor's note, was available for consultation, and agree with documentation and plan.  

## 2016-08-08 ENCOUNTER — Other Ambulatory Visit: Payer: Self-pay

## 2016-08-08 MED ORDER — NAPROXEN 500 MG PO TABS: ORAL_TABLET | ORAL | 0 refills | Status: DC

## 2016-08-08 NOTE — Telephone Encounter (Signed)
Rx sent electronically.  

## 2016-08-11 ENCOUNTER — Encounter: Payer: Self-pay | Admitting: Family Medicine

## 2016-08-11 ENCOUNTER — Ambulatory Visit (INDEPENDENT_AMBULATORY_CARE_PROVIDER_SITE_OTHER): Payer: Medicare Other | Admitting: Family Medicine

## 2016-08-11 ENCOUNTER — Encounter: Payer: Self-pay | Admitting: *Deleted

## 2016-08-11 VITALS — BP 122/84 | HR 92 | Temp 98.8°F | Wt 225.0 lb

## 2016-08-11 DIAGNOSIS — G894 Chronic pain syndrome: Secondary | ICD-10-CM

## 2016-08-11 DIAGNOSIS — E669 Obesity, unspecified: Secondary | ICD-10-CM

## 2016-08-11 DIAGNOSIS — F331 Major depressive disorder, recurrent, moderate: Secondary | ICD-10-CM | POA: Diagnosis not present

## 2016-08-11 DIAGNOSIS — Z7189 Other specified counseling: Secondary | ICD-10-CM | POA: Insufficient documentation

## 2016-08-11 DIAGNOSIS — Z950 Presence of cardiac pacemaker: Secondary | ICD-10-CM

## 2016-08-11 DIAGNOSIS — Z0000 Encounter for general adult medical examination without abnormal findings: Secondary | ICD-10-CM | POA: Diagnosis not present

## 2016-08-11 DIAGNOSIS — IMO0001 Reserved for inherently not codable concepts without codable children: Secondary | ICD-10-CM

## 2016-08-11 DIAGNOSIS — E785 Hyperlipidemia, unspecified: Secondary | ICD-10-CM

## 2016-08-11 DIAGNOSIS — F411 Generalized anxiety disorder: Secondary | ICD-10-CM

## 2016-08-11 DIAGNOSIS — D696 Thrombocytopenia, unspecified: Secondary | ICD-10-CM

## 2016-08-11 NOTE — Assessment & Plan Note (Signed)
Chronic, stable. Continue lipitor. Discussed increased aerobic exercise to improve HDL levels.

## 2016-08-11 NOTE — Assessment & Plan Note (Signed)
Preventative protocols reviewed and updated unless pt declined. Discussed healthy diet and lifestyle.  

## 2016-08-11 NOTE — Progress Notes (Signed)
BP 122/84   Pulse 92   Temp 98.8 F (37.1 C) (Oral)   Wt 225 lb (102.1 kg)   BMI 35.24 kg/m    CC: CPE Subjective:    Patient ID: Mark [name], male    DOB: 07-14-1956, 61 y.o.   MRN: [id number]  HPI: Mark [name] is a 61 y.o. male presenting on 08/11/2016 for Annual Exam   Saw Virl Axe last month for medicare wellness visit, note reviewed. Failed hearing screen. Pt endorses some trouble with this, has seen [facility]  GAD - on prozac 60mg  daily as well as klonopin 1mg  QD to TID PRN - has received 3 mo supply at a time.  Preventative: COLONOSCOPY 07/2007 prostate nodule, hemorrhoid, rpt 5 yrs Mechele Collin)  Prostate cancer screening - followed by urology Dr Lonna Cobb  Lung cancer screening - not indicated Flu shot yearly Tdap 2011 Pneumovax 2007 Shingles shot - not due Advanced directive discussion - spoke with [place] - received advanced directive packet last month. HCPOA would be Bethann Berkshire (lifelong friend).  Seat belt use discussed Sunscreen use and skin screen discussed  Ex smoker Alcohol - none   Divorced. Lives with wife. Edu: 8th grade Occ: retired, disability for chronic neck pain  Activity: no regular exercise - limited by back and knee pain Diet: poor water, lots of tang and gatorade, no vegetables or fruits  Relevant past medical, surgical, family and social history reviewed and updated as indicated. Interim medical history since our last visit reviewed. Allergies and medications reviewed and updated. Current Outpatient Prescriptions on File Prior to Visit  Medication Sig  . aspirin 81 MG tablet Take 81 mg by mouth daily.    Marland Kitchen atorvastatin (LIPITOR) 20 MG tablet Take 1 tablet (20 mg total) by mouth daily.  . cetirizine (ZYRTEC) 10 MG tablet Take 10 mg by mouth daily. As needed  . Cholecalciferol (VITAMIN D) 2000 units CAPS Take 1 capsule by mouth daily.  . clindamycin (CLEOCIN) 150 MG capsule Take 150 mg by mouth as needed. 4 caps 1 hour before dental procedures.    . clonazePAM (KLONOPIN) 1 MG tablet TAKE 1 BY MOUTH 3 TIMES DAILY AS NEEDED FOR ANXIETY  . CRANBERRY PO Take 4,200 mg by mouth daily.   Marland Kitchen desonide (DESOWEN) 0.05 % ointment Apply topically 2 (two) times daily. (Patient taking differently: Apply topically as needed. )  . FLUoxetine (PROZAC) 20 MG capsule TAKE 1 BY MOUTH DAILY  . FLUoxetine (PROZAC) 40 MG capsule TAKE 1 BY MOUTH DAILY  . fluticasone (FLONASE) 50 MCG/ACT nasal spray Place 2 sprays into both nostrils daily.  . furosemide (LASIX) 20 MG tablet TAKE 1 BY MOUTH DAILY  . Garlic 1000 MG CAPS Take 1 capsule by mouth daily.    . Multiple Vitamin (MULTIVITAMIN PO) Take 1 tablet by mouth daily.    . Naloxone HCl (NARCAN) 4 MG/0.1ML LIQD Spray into one nostril. Repeat with second device into other nostril after 2-3 minutes if no or minimal response.  . naproxen (NAPROSYN) 500 MG tablet TAKE 1 BY MOUTH TWICE DAILY WITH A MEAL  . Omega-3 Fatty Acids (FISH OIL) 1200 MG CAPS Take 1,200 mg by mouth daily.   Marland Kitchen omeprazole (PRILOSEC) 20 MG capsule TAKE 1 BY MOUTH DAILY  . [START ON 10/26/2016] oxyCODONE (OXY IR/ROXICODONE) 5 MG immediate release tablet Take 1 tablet (5 mg total) by mouth every 6 (six) hours as needed for severe pain.  [name] ON 08/27/2016] oxyCODONE (OXY IR/ROXICODONE) 5 MG immediate  release tablet Take 1 tablet (5 mg total) by mouth every 6 (six) hours as needed for severe pain.  [name]. [START ON 09/26/2016] oxyCODONE (OXY IR/ROXICODONE) 5 MG immediate release tablet Take 1 tablet (5 mg total) by mouth every 6 (six) hours as needed for severe pain.  [name]. [START ON 10/26/2016] oxyCODONE (OXYCONTIN) 10 mg 12 hr tablet Take 1 tablet (10 mg total) by mouth every 12 (twelve) hours.  [name]. [START ON 08/27/2016] oxyCODONE (OXYCONTIN) 10 mg 12 hr tablet Take 1 tablet (10 mg total) by mouth every 12 (twelve) hours.  [name]. [START ON 09/26/2016] oxyCODONE (OXYCONTIN) 10 mg 12 hr tablet Take 1 tablet (10 mg total) by mouth every 12 (twelve) hours.  . polyethylene  glycol powder (MIRALAX) powder Take 1 Container by mouth as needed.   . potassium citrate (UROCIT-K) 10 MEQ (1080 MG) SR tablet Take 10 mEq by mouth 2 (two) times daily.  . Saw Palmetto, Serenoa repens, (SAW PALMETTO PO) Take 450 mg by mouth daily.   . sildenafil (REVATIO) 20 MG tablet 3-5 tablets daily as needed  . VITAMIN B COMPLEX-C CAPS Take by mouth daily.    No current facility-administered medications on file prior to visit.     Review of Systems  Constitutional: Negative for activity change, appetite change, chills, fatigue, fever and unexpected weight change.  HENT: Negative for hearing loss.   Eyes: Negative for visual disturbance.  Respiratory: Negative for cough, chest tightness, shortness of breath and wheezing.   Cardiovascular: Positive for leg swelling (on lasix for this). Negative for chest pain and palpitations.  Gastrointestinal: Negative for abdominal distention, abdominal pain, blood in stool, constipation, diarrhea, nausea and vomiting.  Genitourinary: Negative for difficulty urinating and hematuria.  Musculoskeletal: Negative for arthralgias, myalgias and neck pain.  Skin: Negative for rash.  Neurological: Negative for dizziness, seizures, syncope and headaches.  Hematological: Negative for adenopathy. Does not bruise/bleed easily.  Psychiatric/Behavioral: Negative for dysphoric mood. The patient is nervous/anxious.    Per HPI unless specifically indicated in ROS section     Objective:    BP 122/84   Pulse 92   Temp 98.8 F (37.1 C) (Oral)   Wt 225 lb (102.1 kg)   BMI 35.24 kg/m   Wt Readings from Last 3 Encounters:  08/11/16 225 lb (102.1 kg)  08/04/16 223 lb 8 oz (101.4 kg)  07/14/16 225 lb (102.1 kg)    Physical Exam  Constitutional: He is oriented to person, place, and time. He appears well-developed and well-nourished. No distress.  HENT:  Head: Normocephalic and atraumatic.  Right Ear: Hearing, tympanic membrane, external ear and ear canal  normal.  Left Ear: Hearing, tympanic membrane, external ear and ear canal normal.  Nose: Nose normal.  Mouth/Throat: Uvula is midline, oropharynx is clear and moist and mucous membranes are normal. No oropharyngeal exudate, posterior oropharyngeal edema or posterior oropharyngeal erythema.  Eyes: Conjunctivae and EOM are normal. Pupils are equal, round, and reactive to light. No scleral icterus.  Neck: Normal range of motion. Neck supple. No thyromegaly present.  Cardiovascular: Normal rate, regular rhythm and intact distal pulses.   Murmur (1/6) heard. Pulses:      Radial pulses are 2+ on the right side, and 2+ on the left side.  Pulmonary/Chest: Effort normal and breath sounds normal. No respiratory distress. He has no wheezes. He has no rales.  Abdominal: Soft. Bowel sounds are normal. He exhibits no distension and no mass. There is no tenderness. There is no rebound and no  guarding.  Genitourinary:  Genitourinary Comments: deferred  Musculoskeletal: Normal range of motion. He exhibits no edema.  Lymphadenopathy:    He has no cervical adenopathy.  Neurological: He is alert and oriented to person, place, and time.  CN grossly intact, station and gait intact  Skin: Skin is warm and dry. No rash noted.  Psychiatric: He has a normal mood and affect. His behavior is normal. Judgment and thought content normal.  Nursing note and vitals reviewed.  Results for orders placed or performed in visit on 08/04/16  CBC with Differential/Platelet  Result Value Ref Range   WBC 9.1 4.0 - 10.5 K/uL   RBC 4.76 4.22 - 5.81 Mil/uL   Hemoglobin 14.9 13.0 - 17.0 g/dL   HCT 16.1 09.6 - 04.5 %   MCV 88.3 78.0 - 100.0 fl   MCHC 35.4 30.0 - 36.0 g/dL   RDW 40.9 81.1 - 91.4 %   Platelets 164.0 150.0 - 400.0 K/uL   Neutrophils Relative % 61.1 43.0 - 77.0 %   Lymphocytes Relative 24.0 12.0 - 46.0 %   Monocytes Relative 11.8 3.0 - 12.0 %   Eosinophils Relative 2.4 0.0 - 5.0 %   Basophils Relative 0.7 0.0 -  3.0 %   Neutro Abs 5.6 1.4 - 7.7 K/uL   Lymphs Abs 2.2 0.7 - 4.0 K/uL   Monocytes Absolute 1.1 (H) 0.1 - 1.0 K/uL   Eosinophils Absolute 0.2 0.0 - 0.7 K/uL   Basophils Absolute 0.1 0.0 - 0.1 K/uL  Lipid panel  Result Value Ref Range   Cholesterol 104 0 - 200 mg/dL   Triglycerides 782.9 0.0 - 149.0 mg/dL   HDL 56.21 (L) >30.86 mg/dL   VLDL 57.8 0.0 - 46.9 mg/dL   LDL Cholesterol 41 0 - 99 mg/dL   Total CHOL/HDL Ratio 3    NonHDL 69.10   Basic metabolic panel  Result Value Ref Range   Sodium 140 135 - 145 mEq/L   Potassium 4.0 3.5 - 5.1 mEq/L   Chloride 102 96 - 112 mEq/L   CO2 31 19 - 32 mEq/L   Glucose, Bld 120 (H) 70 - 99 mg/dL   BUN 18 6 - 23 mg/dL   Creatinine, Ser 6.29 0.40 - 1.50 mg/dL   Calcium 9.1 8.4 - 52.8 mg/dL   GFR 41.32 >44.01 mL/min  PSA  Result Value Ref Range   PSA 1.10 0.10 - 4.00 ng/mL      Assessment & Plan:   Problem List Items Addressed This Visit    Advanced care planning/counseling discussion    Advanced directive discussion - spoke with Virl Axe - received advanced directive packet last month. HCPOA would be Bethann Berkshire (lifelong friend).       Chronic pain syndrome    Followed by pain management.       GAD (generalized anxiety disorder)    Chronic, stable. Continue prozac and klonopin - takes 1-3 tablets daily. Discussed # per prescription. He has been receiving this 3 mo supply at a time.  Update UDS/controlled substance agreement today.       Health maintenance examination - Primary    Preventative protocols reviewed and updated unless pt declined. Discussed healthy diet and lifestyle.       History of permanent cardiac pacemaker placement   Hyperlipidemia    Chronic, stable. Continue lipitor. Discussed increased aerobic exercise to improve HDL levels.       MDD (major depressive disorder), recurrent episode, moderate (HCC)    Chronic, stable. Continue  prozac and klonopin.       Obesity, Class II, BMI 35-39.9, with comorbidity (HCC)      Discussed healthy diet and lifestyle changes to affect sustainable weight loss.      Thrombocytopenia (HCC)    Improved plt today.           Follow up plan: Return in about 6 months (around 02/08/2017) for follow up visit.  [name], MD

## 2016-08-11 NOTE — Assessment & Plan Note (Signed)
Chronic, stable. Continue prozac and klonopin - takes 1-3 tablets daily. Discussed # per prescription. He has been receiving this 3 mo supply at a time.  Update UDS/controlled substance agreement today.

## 2016-08-11 NOTE — Assessment & Plan Note (Signed)
Improved plt today.

## 2016-08-11 NOTE — Patient Instructions (Addendum)
Less tang and peanut butter, more fruits/vegetables daily.  Sign controlled substance agreement form with [place] - update UDS today.  You are doing well today.  Return as needed or in 6 months for follow up visit.   Health Maintenance, Male A healthy lifestyle and preventative care can promote health and wellness.  Maintain regular health, dental, and eye exams.  Eat a healthy diet. Foods like vegetables, fruits, whole grains, low-fat dairy products, and lean protein foods contain the nutrients you need and are low in calories. Decrease your intake of foods high in solid fats, added sugars, and salt. Get information about a proper diet from your health care provider, if necessary.  Regular physical exercise is one of the most important things you can do for your health. Most adults should get at least 150 minutes of moderate-intensity exercise (any activity that increases your heart rate and causes you to sweat) each week. In addition, most adults need muscle-strengthening exercises on 2 or more days a week.   Maintain a healthy weight. The body mass index (BMI) is a screening tool to identify possible weight problems. It provides an estimate of body fat based on height and weight. Your health care provider can find your BMI and can help you achieve or maintain a healthy weight. For males 20 years and older:  A BMI below 18.5 is considered underweight.  A BMI of 18.5 to 24.9 is normal.  A BMI of 25 to 29.9 is considered overweight.  A BMI of 30 and above is considered obese.  Maintain normal blood lipids and cholesterol by exercising and minimizing your intake of saturated fat. Eat a balanced diet with plenty of fruits and vegetables. Blood tests for lipids and cholesterol should begin at age 57 and be repeated every 5 years. If your lipid or cholesterol levels are high, you are over age 16, or you are at high risk for heart disease, you may need your cholesterol levels checked more  frequently.Ongoing high lipid and cholesterol levels should be treated with medicines if diet and exercise are not working.  If you smoke, find out from your health care provider how to quit. If you do not use tobacco, do not start.  Lung cancer screening is recommended for adults aged 55-80 years who are at high risk for developing lung cancer because of a history of smoking. A yearly low-dose CT scan of the lungs is recommended for people who have at least a 30-pack-year history of smoking and are current smokers or have quit within the past 15 years. A pack year of smoking is smoking an average of 1 pack of cigarettes a day for 1 year (for example, a 30-pack-year history of smoking could mean smoking 1 pack a day for 30 years or 2 packs a day for 15 years). Yearly screening should continue until the smoker has stopped smoking for at least 15 years. Yearly screening should be stopped for people who develop a health problem that would prevent them from having lung cancer treatment.  If you choose to drink alcohol, do not have more than 2 drinks per day. One drink is considered to be 12 oz (360 mL) of beer, 5 oz (150 mL) of wine, or 1.5 oz (45 mL) of liquor.  Avoid the use of street drugs. Do not share needles with anyone. Ask for help if you need support or instructions about stopping the use of drugs.  High blood pressure causes heart disease and increases the risk of  stroke. High blood pressure is more likely to develop in:  People who have blood pressure in the end of the normal range (100-139/85-89 mm Hg).  People who are overweight or obese.  People who are African American.  If you are 68-52 years of age, have your blood pressure checked every 3-5 years. If you are 33 years of age or older, have your blood pressure checked every year. You should have your blood pressure measured twice-once when you are at a hospital or clinic, and once when you are not at a hospital or clinic. Record the  average of the two measurements. To check your blood pressure when you are not at a hospital or clinic, you can use:  An automated blood pressure machine at a pharmacy.  A home blood pressure monitor.  If you are 64-14 years old, ask your health care provider if you should take aspirin to prevent heart disease.  Diabetes screening involves taking a blood sample to check your fasting blood sugar level. This should be done once every 3 years after age 63 if you are at a normal weight and without risk factors for diabetes. Testing should be considered at a younger age or be carried out more frequently if you are overweight and have at least 1 risk factor for diabetes.  Colorectal cancer can be detected and often prevented. Most routine colorectal cancer screening begins at the age of 5 and continues through age 8. However, your health care provider may recommend screening at an earlier age if you have risk factors for colon cancer. On a yearly basis, your health care provider may provide home test kits to check for hidden blood in the stool. A small camera at the end of a tube may be used to directly examine the colon (sigmoidoscopy or colonoscopy) to detect the earliest forms of colorectal cancer. Talk to your health care provider about this at age 53 when routine screening begins. A direct exam of the colon should be repeated every 5-10 years through age 8, unless early forms of precancerous polyps or small growths are found.  People who are at an increased risk for hepatitis B should be screened for this virus. You are considered at high risk for hepatitis B if:  You were born in a country where hepatitis B occurs often. Talk with your health care provider about which countries are considered high risk.  Your parents were born in a high-risk country and you have not received a shot to protect against hepatitis B (hepatitis B vaccine).  You have HIV or AIDS.  You use needles to inject street  drugs.  You live with, or have sex with, someone who has hepatitis B.  You are a man who has sex with other men (MSM).  You get hemodialysis treatment.  You take certain medicines for conditions like cancer, organ transplantation, and autoimmune conditions.  Hepatitis C blood testing is recommended for all people born from [birth year] through 1964 and any individual with known risk factors for hepatitis C.  Healthy men should no longer receive prostate-specific antigen (PSA) blood tests as part of routine cancer screening. Talk to your health care provider about prostate cancer screening.  Testicular cancer screening is not recommended for adolescents or adult males who have no symptoms. Screening includes self-exam, a health care provider exam, and other screening tests. Consult with your health care provider about any symptoms you have or any concerns you have about testicular cancer.  Practice safe sex. Use  condoms and avoid high-risk sexual practices to reduce the spread of sexually transmitted infections (STIs).  You should be screened for STIs, including gonorrhea and chlamydia if:  You are sexually active and are younger than 24 years.  You are older than 24 years, and your health care provider tells you that you are at risk for this type of infection.  Your sexual activity has changed since you were last screened, and you are at an increased risk for chlamydia or gonorrhea. Ask your health care provider if you are at risk.  If you are at risk of being infected with HIV, it is recommended that you take a prescription medicine daily to prevent HIV infection. This is called pre-exposure prophylaxis (PrEP). You are considered at risk if:  You are a man who has sex with other men (MSM).  You are a heterosexual man who is sexually active with multiple partners.  You take drugs by injection.  You are sexually active with a partner who has HIV.  Talk with your health care provider about  whether you are at high risk of being infected with HIV. If you choose to begin PrEP, you should first be tested for HIV. You should then be tested every 3 months for as long as you are taking PrEP.  Use sunscreen. Apply sunscreen liberally and repeatedly throughout the day. You should seek shade when your shadow is shorter than you. Protect yourself by wearing long sleeves, pants, a wide-brimmed hat, and sunglasses year round whenever you are outdoors.  Tell your health care provider of new moles or changes in moles, especially if there is a change in shape or color. Also, tell your health care provider if a mole is larger than the size of a pencil eraser.  A one-time screening for abdominal aortic aneurysm (AAA) and surgical repair of large AAAs by ultrasound is recommended for men aged 65-75 years who are current or former smokers.  Stay current with your vaccines (immunizations). This information is not intended to replace advice given to you by your health care provider. Make sure you discuss any questions you have with your health care provider. Document Released: 01/20/2008 Document Revised: 08/14/2014 Document Reviewed: 04/27/2015 Elsevier Interactive Patient Education  2017 [employer]

## 2016-08-11 NOTE — Progress Notes (Signed)
Pre visit review using our clinic review tool, if applicable. No additional management support is needed unless otherwise documented below in the visit note. 

## 2016-08-11 NOTE — Assessment & Plan Note (Signed)
Advanced directive discussion - spoke with [place] - received advanced directive packet last month. HCPOA would be [name] (lifelong friend).

## 2016-08-11 NOTE — Assessment & Plan Note (Signed)
Followed by pain management. 

## 2016-08-11 NOTE — Assessment & Plan Note (Signed)
Chronic, stable. Continue prozac and klonopin.

## 2016-08-11 NOTE — Assessment & Plan Note (Signed)
Discussed healthy diet and lifestyle changes to affect sustainable weight loss  

## 2016-08-14 DIAGNOSIS — Z79899 Other long term (current) drug therapy: Secondary | ICD-10-CM | POA: Diagnosis not present

## 2016-08-15 DIAGNOSIS — I495 Sick sinus syndrome: Secondary | ICD-10-CM | POA: Diagnosis not present

## 2016-09-04 DIAGNOSIS — I10 Essential (primary) hypertension: Secondary | ICD-10-CM | POA: Diagnosis not present

## 2016-09-04 DIAGNOSIS — Z950 Presence of cardiac pacemaker: Secondary | ICD-10-CM | POA: Diagnosis not present

## 2016-09-04 DIAGNOSIS — R011 Cardiac murmur, unspecified: Secondary | ICD-10-CM | POA: Diagnosis not present

## 2016-09-04 DIAGNOSIS — R001 Bradycardia, unspecified: Secondary | ICD-10-CM | POA: Diagnosis not present

## 2016-10-12 ENCOUNTER — Other Ambulatory Visit: Payer: Self-pay | Admitting: *Deleted

## 2016-10-12 MED ORDER — FLUOXETINE HCL 20 MG PO CAPS: ORAL_CAPSULE | ORAL | 1 refills | Status: DC

## 2016-10-18 NOTE — Telephone Encounter (Signed)
Received call from [facility] to call in fluoxetine. Per 10/12/16 refill request appears already refilled. I spoke with Tyson BabinskiJarod [facility] and he said nothing needed from out office.

## 2016-10-23 ENCOUNTER — Other Ambulatory Visit: Payer: Self-pay | Admitting: *Deleted

## 2016-10-23 DIAGNOSIS — N401 Enlarged prostate with lower urinary tract symptoms: Secondary | ICD-10-CM | POA: Diagnosis not present

## 2016-10-23 DIAGNOSIS — N200 Calculus of kidney: Secondary | ICD-10-CM | POA: Diagnosis not present

## 2016-10-23 MED ORDER — FLUOXETINE HCL 40 MG PO CAPS: ORAL_CAPSULE | ORAL | 1 refills | Status: DC

## 2016-10-26 ENCOUNTER — Ambulatory Visit: Payer: Medicare Other | Attending: Pain Medicine | Admitting: Pain Medicine

## 2016-10-26 ENCOUNTER — Encounter: Payer: Self-pay | Admitting: Pain Medicine

## 2016-10-26 VITALS — BP 129/85 | HR 85 | Temp 98.0°F | Resp 18 | Ht 67.0 in | Wt 225.0 lb

## 2016-10-26 DIAGNOSIS — Z881 Allergy status to other antibiotic agents status: Secondary | ICD-10-CM | POA: Insufficient documentation

## 2016-10-26 DIAGNOSIS — M488X2 Other specified spondylopathies, cervical region: Secondary | ICD-10-CM | POA: Insufficient documentation

## 2016-10-26 DIAGNOSIS — M4722 Other spondylosis with radiculopathy, cervical region: Secondary | ICD-10-CM | POA: Diagnosis not present

## 2016-10-26 DIAGNOSIS — Z79899 Other long term (current) drug therapy: Secondary | ICD-10-CM | POA: Diagnosis not present

## 2016-10-26 DIAGNOSIS — F411 Generalized anxiety disorder: Secondary | ICD-10-CM | POA: Diagnosis not present

## 2016-10-26 DIAGNOSIS — G4700 Insomnia, unspecified: Secondary | ICD-10-CM | POA: Insufficient documentation

## 2016-10-26 DIAGNOSIS — Z809 Family history of malignant neoplasm, unspecified: Secondary | ICD-10-CM | POA: Insufficient documentation

## 2016-10-26 DIAGNOSIS — R51 Headache: Secondary | ICD-10-CM | POA: Insufficient documentation

## 2016-10-26 DIAGNOSIS — Z880 Allergy status to penicillin: Secondary | ICD-10-CM | POA: Insufficient documentation

## 2016-10-26 DIAGNOSIS — M5412 Radiculopathy, cervical region: Secondary | ICD-10-CM | POA: Diagnosis not present

## 2016-10-26 DIAGNOSIS — Z981 Arthrodesis status: Secondary | ICD-10-CM | POA: Diagnosis not present

## 2016-10-26 DIAGNOSIS — G8929 Other chronic pain: Secondary | ICD-10-CM | POA: Diagnosis not present

## 2016-10-26 DIAGNOSIS — Z888 Allergy status to other drugs, medicaments and biological substances status: Secondary | ICD-10-CM | POA: Insufficient documentation

## 2016-10-26 DIAGNOSIS — Z8261 Family history of arthritis: Secondary | ICD-10-CM | POA: Insufficient documentation

## 2016-10-26 DIAGNOSIS — Z79891 Long term (current) use of opiate analgesic: Secondary | ICD-10-CM

## 2016-10-26 DIAGNOSIS — M170 Bilateral primary osteoarthritis of knee: Secondary | ICD-10-CM | POA: Diagnosis not present

## 2016-10-26 DIAGNOSIS — G4733 Obstructive sleep apnea (adult) (pediatric): Secondary | ICD-10-CM | POA: Insufficient documentation

## 2016-10-26 DIAGNOSIS — Z7951 Long term (current) use of inhaled steroids: Secondary | ICD-10-CM | POA: Diagnosis not present

## 2016-10-26 DIAGNOSIS — F1190 Opioid use, unspecified, uncomplicated: Secondary | ICD-10-CM

## 2016-10-26 DIAGNOSIS — E669 Obesity, unspecified: Secondary | ICD-10-CM | POA: Insufficient documentation

## 2016-10-26 DIAGNOSIS — Z950 Presence of cardiac pacemaker: Secondary | ICD-10-CM | POA: Insufficient documentation

## 2016-10-26 DIAGNOSIS — M488X6 Other specified spondylopathies, lumbar region: Secondary | ICD-10-CM | POA: Insufficient documentation

## 2016-10-26 DIAGNOSIS — M4802 Spinal stenosis, cervical region: Secondary | ICD-10-CM | POA: Diagnosis not present

## 2016-10-26 DIAGNOSIS — G894 Chronic pain syndrome: Secondary | ICD-10-CM

## 2016-10-26 DIAGNOSIS — E785 Hyperlipidemia, unspecified: Secondary | ICD-10-CM | POA: Insufficient documentation

## 2016-10-26 DIAGNOSIS — M47898 Other spondylosis, sacral and sacrococcygeal region: Secondary | ICD-10-CM | POA: Insufficient documentation

## 2016-10-26 DIAGNOSIS — Z7982 Long term (current) use of aspirin: Secondary | ICD-10-CM | POA: Diagnosis not present

## 2016-10-26 DIAGNOSIS — Z6835 Body mass index (BMI) 35.0-35.9, adult: Secondary | ICD-10-CM | POA: Insufficient documentation

## 2016-10-26 DIAGNOSIS — M542 Cervicalgia: Secondary | ICD-10-CM | POA: Diagnosis not present

## 2016-10-26 DIAGNOSIS — M48061 Spinal stenosis, lumbar region without neurogenic claudication: Secondary | ICD-10-CM | POA: Diagnosis not present

## 2016-10-26 DIAGNOSIS — Z885 Allergy status to narcotic agent status: Secondary | ICD-10-CM | POA: Insufficient documentation

## 2016-10-26 DIAGNOSIS — F329 Major depressive disorder, single episode, unspecified: Secondary | ICD-10-CM | POA: Diagnosis not present

## 2016-10-26 DIAGNOSIS — Z87442 Personal history of urinary calculi: Secondary | ICD-10-CM | POA: Insufficient documentation

## 2016-10-26 DIAGNOSIS — Z833 Family history of diabetes mellitus: Secondary | ICD-10-CM | POA: Insufficient documentation

## 2016-10-26 DIAGNOSIS — Z8249 Family history of ischemic heart disease and other diseases of the circulatory system: Secondary | ICD-10-CM | POA: Insufficient documentation

## 2016-10-26 DIAGNOSIS — M19012 Primary osteoarthritis, left shoulder: Secondary | ICD-10-CM | POA: Diagnosis not present

## 2016-10-26 MED ORDER — OXYCODONE HCL 5 MG PO TABS: 5.0000 mg | ORAL_TABLET | Freq: Four times a day (QID) | ORAL | 0 refills | Status: DC | PRN

## 2016-10-26 MED ORDER — OXYCODONE HCL ER 10 MG PO T12A: 10.0000 mg | EXTENDED_RELEASE_TABLET | Freq: Two times a day (BID) | ORAL | 0 refills | Status: DC

## 2016-10-26 NOTE — Patient Instructions (Signed)

## 2016-10-26 NOTE — Progress Notes (Signed)
Patient's Name: Mark [name]  MRN: [id number]  Referring Provider: Ria Bush, MD  DOB: 1956/06/12  PCP: Ria Bush, MD  DOS: 10/26/2016  Note by: Kathlen Brunswick. Dossie Arbour, MD  Service setting: Ambulatory outpatient  Specialty: Interventional Pain Management  Location: [facility] (AMB) Pain Management Facility    Patient type: Established   Primary Reason(s) for Visit: Encounter for prescription drug management (Level of risk: moderate) CC: Neck Pain and Back Pain  HPI  Mark [name] is a 61 y.o. year old, male patient, who comes today for a medication management evaluation. He has Chronic pain syndrome; MDD (major depressive disorder), recurrent episode, moderate ([place]); Insomnia; GAD (generalized anxiety disorder); Medicare annual wellness visit, subsequent; Hyperlipidemia; Thrombocytopenia ([place]); Obesity, Class II, BMI 35-39.9, with comorbidity ([place]); Health maintenance examination; Hydrocele in adult; Candidal dermatitis; Benign prostatic hyperplasia with urinary obstruction; Nephrolithiasis; Bradycardia; Cardiac murmur; Opiate use (60 MME/Day); Encounter for therapeutic drug level monitoring; Chronic neck pain (Location of Primary Source of Pain) (Bilateral) (L>R); Chronic shoulder pain (Bilateral) (L>R); Chronic knee pain (Bilateral) (R>L); History of permanent cardiac pacemaker placement; Opioid-induced constipation (OIC); Radicular pain of shoulder (Bilateral) (L>R); 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); Cervicogenic headache; Cervical spondylosis with radiculopathy (Bilateral) (L>R); Cervical facet syndrome (Bilateral) (L>R); Cervical foraminal stenosis (Severe C6-7) (Left); Chronic sacroiliac joint pain (Bilateral); Osteoarthritis of sacroiliac joint (Bilateral); Lumbar facet hypertrophy (L1-2, L2-3, and L4-5) (Bilateral); Lumbar IVDD (intervertebral disc displacement); 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); Lumbar facet syndrome (Bilateral); Osteoarthritis of shoulder  (Left); Osteoarthritis of knee (Bilateral) (R>L); Obstructive sleep apnea; Hypocitraturia; Long term current use of opiate analgesic; Chronic cervical radicular pain; and Advanced care planning/counseling discussion on his problem list. His primarily concern today is the Neck Pain and Back Pain  Pain Assessment: Self-Reported Pain Score: 3 /10             Reported level is compatible with observation.       Pain Type: Chronic pain Pain Location: (S) Neck (back) Pain Orientation: Right, Left Pain Descriptors / Indicators: Aching Pain Frequency: Constant  Mark [name] was last scheduled for an appointment on 07/14/2016 for medication management. During today's appointment we reviewed Mark [name]'s chronic pain status, as well as his outpatient medication regimen.  The patient  reports that he does not use drugs. His body mass index is 35.24 kg/m.  Further details on both, my assessment(s), as well as the proposed treatment plan, please see below.  Controlled Substance Pharmacotherapy Assessment REMS (Risk Evaluation and Mitigation Strategy)  Analgesic:Oxycodone ER 10 mg every 12 hours (20 mg/day) + oxycodone IR 5 mg every 6 hours (20 mg/day) MME/day:60 mg/day.   Zenovia Jarred, RN  10/26/2016  1:20 PM  Sign at close encounter Nursing Pain Medication Assessment:  Safety precautions to be maintained throughout the outpatient stay will include: orient to surroundings, keep bed in low position, maintain call bell within reach at all times, provide assistance with transfer out of bed and ambulation.  Medication Inspection Compliance: Pill count conducted under aseptic conditions, in front of the patient. Neither the pills nor the bottle was removed from the patient's sight at any time. Once count was completed pills were immediately returned to the patient in their original bottle.  Medication #1: Oxycodone IR 10 mg Pill/Patch Count: 27 of 60 pills remain Bottle Appearance: Standard pharmacy container.  Clearly labeled. Filled Date:02 / 20 / 2018 Last Medication intake:  Today  Medication #2: Oxycodone IR 5 mg Pill/Patch Count: 37 of 120 pills remain  Bottle Appearance: Standard pharmacy container. Clearly labeled. Filled Date02 / 20 / 2018 Last Medication intake:  Today   Pharmacokinetics: Liberation and absorption (onset of action): WNL Distribution (time to peak effect): WNL Metabolism and excretion (duration of action): WNL         Pharmacodynamics: Desired effects: Analgesia: Mark [name] reports >50% benefit. Functional ability: Patient reports that medication allows him to accomplish basic ADLs Clinically meaningful improvement in function (CMIF): Sustained CMIF goals met Perceived effectiveness: Described as relatively effective, allowing for increase in activities of daily living (ADL) Undesirable effects: Side-effects or Adverse reactions: None reported Monitoring: [place] PMP: Online review of the past 50-monthperiod conducted. Compliant with practice rules and regulations List of all UDS test(s) done:  Lab Results  Component Value Date   TOXASSSELUR FINAL 05/15/2016   [place]AL 02/02/2016   [place]AL 11/29/2015   TI[place]FINAL 09/01/2015   Last UDS on record: ToxAssure Select 13  Date Value Ref Range Status  05/15/2016 FINAL  Final    Comment:    ==================================================================== TOXASSURE SELECT 13 (MW) ==================================================================== Test                             Result       Flag       Units Drug Present and Declared for Prescription Verification   Oxycodone                      3650         EXPECTED   ng/mg creat   Noroxycodone                   3374         EXPECTED   ng/mg creat    Sources of oxycodone include scheduled prescription medications.    Noroxycodone is an expected metabolite of oxycodone. Drug Present not Declared for Prescription Verification    7-aminoclonazepam              467          UNEXPECTED ng/mg creat    7-aminoclonazepam is an expected metabolite of clonazepam. Source    of clonazepam is a scheduled prescription medication. ==================================================================== Test                      Result    Flag   Units      Ref Range   Creatinine              42               mg/dL      >=20 ==================================================================== Declared Medications:  The flagging and interpretation on this report are based on the  following declared medications.  Unexpected results may arise from  inaccuracies in the declared medications.  **Note: The testing scope of this panel includes these medications:  Oxycodone  Oxycodone (OxyContin)  **Note: The testing scope of this panel does not include following  reported medications:  Naloxone  Naproxen  Omega-3 Fatty Acids (Fish Oil)  Omeprazole (Prilosec)  Oxybutynin (Ditropan)  Sildenafil (Revatio)  Supplement (Saw Palmetto) ==================================================================== For clinical consultation, please call ([phone] ====================================================================    UDS interpretation: Compliant          Medication Assessment Form: Reviewed. Patient indicates being compliant with therapy Treatment compliance: Compliant Risk Assessment Profile: Aberrant behavior: See prior evaluations. None observed or detected today Comorbid  factors increasing risk of overdose: See prior notes. No additional risks detected today Risk of substance use disorder (SUD): Low Opioid Risk Tool (ORT) Total Score: 1  Interpretation Table:  Score <3 = Low Risk for SUD  Score between 4-7 = Moderate Risk for SUD  Score >8 = High Risk for Opioid Abuse   Risk Mitigation Strategies:  Patient Counseling: Covered Patient-Prescriber Agreement (PPA): Present and active  Notification to other healthcare  providers: Done  Pharmacologic Plan: No change in therapy, at this time  Laboratory Chemistry  Inflammation Markers Lab Results  Component Value Date   CRP 1.7 (H) 09/01/2015   ESRSEDRATE 6 09/01/2015   (CRP: Acute Phase) (ESR: Chronic Phase) Renal Function Markers Lab Results  Component Value Date   BUN 18 08/04/2016   CREATININE 1.08 08/04/2016   GFRAA >60 09/01/2015   GFRNONAA >60 09/01/2015   Hepatic Function Markers Lab Results  Component Value Date   AST 24 12/29/2015   ALT 23 12/29/2015   ALBUMIN 4.2 12/29/2015   ALKPHOS 58 12/29/2015   HCVAB NEGATIVE 07/22/2013   Electrolytes Lab Results  Component Value Date   NA 140 08/04/2016   K 4.0 08/04/2016   CL 102 08/04/2016   CALCIUM 9.1 08/04/2016   MG 2.1 09/01/2015   Neuropathy Markers Lab Results  Component Value Date   VITAMINB12 861 02/02/2016   Bone Pathology Markers Lab Results  Component Value Date   ALKPHOS 58 12/29/2015   25OHVITD1 63 02/02/2016   25OHVITD2 <1.0 02/02/2016   25OHVITD3 63 02/02/2016   CALCIUM 9.1 08/04/2016   Coagulation Parameters Lab Results  Component Value Date   INR 1.0 06/03/2007   LABPROT 13.7 06/03/2007   APTT 29 06/03/2007   PLT 164.0 08/04/2016   Cardiovascular Markers Lab Results  Component Value Date   HGB 14.9 08/04/2016   HCT 42.0 08/04/2016   Note: Lab results reviewed.  Recent Diagnostic Imaging Review  Dg C-arm 1-60 Min-no Report  Result Date: 07/06/2016 Fluoroscopy was utilized by the requesting physician.  No radiographic interpretation.   Note: Imaging results reviewed.          Meds  The patient has a current medication list which includes the following prescription(s): aspirin, atorvastatin, cetirizine, vitamin d, clindamycin, clonazepam, cranberry, desonide, fluoxetine, fluoxetine, fluticasone, furosemide, garlic, multiple vitamins-minerals, naloxone, naproxen, fish oil, omeprazole, oxycodone, oxycodone, oxycodone, oxycodone, oxycodone,  oxycodone, polyethylene glycol powder, potassium citrate, saw palmetto (serenoa repens), sildenafil, and vitamin b complex-c.  Current Outpatient Prescriptions on File Prior to Visit  Medication Sig  . aspirin 81 MG tablet Take 81 mg by mouth daily.    Marland Kitchen atorvastatin (LIPITOR) 20 MG tablet Take 1 tablet (20 mg total) by mouth daily.  . cetirizine (ZYRTEC) 10 MG tablet Take 10 mg by mouth daily. As needed  . Cholecalciferol (VITAMIN D) 2000 units CAPS Take 1 capsule by mouth daily.  . clindamycin (CLEOCIN) 150 MG capsule Take 150 mg by mouth as needed. 4 caps 1 hour before dental procedures.   . clonazePAM (KLONOPIN) 1 MG tablet TAKE 1 BY MOUTH 3 TIMES DAILY AS NEEDED FOR ANXIETY  . CRANBERRY PO Take 4,200 mg by mouth daily.   Marland Kitchen desonide (DESOWEN) 0.05 % ointment Apply topically 2 (two) times daily. (Patient taking differently: Apply topically as needed. )  . FLUoxetine (PROZAC) 20 MG capsule TAKE 1 BY MOUTH DAILY  . FLUoxetine (PROZAC) 40 MG capsule TAKE 1 BY MOUTH DAILY  . fluticasone (FLONASE) 50 MCG/ACT nasal spray Place 2  sprays into both nostrils daily.  . furosemide (LASIX) 20 MG tablet TAKE 1 BY MOUTH DAILY  . Garlic 7915 MG CAPS Take 1 capsule by mouth daily.    . Multiple Vitamin (MULTIVITAMIN PO) Take 1 tablet by mouth daily.    . Naloxone HCl (NARCAN) 4 MG/0.1ML LIQD Spray into one nostril. Repeat with second device into other nostril after 2-3 minutes if no or minimal response.  . naproxen (NAPROSYN) 500 MG tablet TAKE 1 BY MOUTH TWICE DAILY WITH A MEAL  . Omega-3 Fatty Acids (FISH OIL) 1200 MG CAPS Take 1,200 mg by mouth daily.   Marland Kitchen omeprazole (PRILOSEC) 20 MG capsule TAKE 1 BY MOUTH DAILY  . polyethylene glycol powder (MIRALAX) powder Take 1 Container by mouth as needed.   . potassium citrate (UROCIT-K) 10 MEQ (1080 MG) SR tablet Take 10 mEq by mouth 2 (two) times daily.  . Saw Palmetto, Serenoa repens, (SAW PALMETTO PO) Take 450 mg by mouth daily.   . sildenafil (REVATIO) 20 MG  tablet 3-5 tablets daily as needed  . VITAMIN B COMPLEX-C CAPS Take by mouth daily.    No current facility-administered medications on file prior to visit.    ROS  Constitutional: Denies any fever or chills Gastrointestinal: No reported hemesis, hematochezia, vomiting, or acute GI distress Musculoskeletal: Denies any acute onset joint swelling, redness, loss of ROM, or weakness Neurological: No reported episodes of acute onset apraxia, aphasia, dysarthria, agnosia, amnesia, paralysis, loss of coordination, or loss of consciousness  Allergies  Mark [name] is allergic to ciprofloxacin; metformin; penicillin v potassium; penicillins; buprenorphine hcl; and morphine and related.  [place]  Drug: Mark [name]  reports that he does not use drugs. Alcohol:  reports that he does not drink alcohol. Tobacco:  reports that he has never smoked. He has never used smokeless tobacco. Medical:  has a past medical history of Anxiety; Chronic pain; Depression; History of kidney stones (April 12,2017); History of permanent cardiac pacemaker placement (05/31/2015); Kidney stones; Migraine; Prostatitis; and Sleep apnea. Family: family history includes Arthritis in his father and mother; Cancer in his father and mother; Diabetes in his mother; Heart block in his father; Heart disease in his father and mother.  Past Surgical History:  Procedure Laterality Date  . CARDIOVASCULAR STRESS TEST  2014   Dr Ubaldo Glassing  . CARPAL TUNNEL RELEASE Bilateral 2006  . CERVICAL SPINE SURGERY  2008   C5/6  . COLONOSCOPY  07/2007   prostate nodule, hemorrhoid, rpt 5 yrs [name])  . LITHOTRIPSY Bilateral 2012   Stoioff  . LITHOTRIPSY Left 2017   x2 Stoioff  . PACEMAKER PLACEMENT  2009   s/p Medtronic Adapta DR Pacemaker (Dr. Ubaldo Glassing)  . ROTATOR CUFF REPAIR Left 2009  . TONSILLECTOMY  1964  . ULNAR NERVE REPAIR  2008   Left   Constitutional Exam  General appearance: Well nourished, well developed, and well hydrated. In no apparent  acute distress Vitals:   10/26/16 1310 10/26/16 1311  BP:  129/85  Pulse:  85  Resp:  18  Temp:  98 F (36.7 C)  SpO2:  94%  Weight: 225 lb (102.1 kg)   Height: _0  (1.702 m)    BMI Assessment: Estimated body mass index is 35.24 kg/m as calculated from the following:   Height as of this encounter: _1  (1.702 m).   Weight as of this encounter: 225 lb (102.1 kg).  BMI interpretation table: BMI level Category Range association with higher incidence of chronic pain  <  18 kg/m2 Underweight   18.5-24.9 kg/m2 Ideal body weight   25-29.9 kg/m2 Overweight Increased incidence by 20%  30-34.9 kg/m2 Obese (Class I) Increased incidence by 68%  35-39.9 kg/m2 Severe obesity (Class II) Increased incidence by 136%  >40 kg/m2 Extreme obesity (Class III) Increased incidence by 254%   BMI Readings from Last 4 Encounters:  10/26/16 35.24 kg/m  08/11/16 35.24 kg/m  08/04/16 35.01 kg/m  07/14/16 34.21 kg/m   Wt Readings from Last 4 Encounters:  10/26/16 225 lb (102.1 kg)  08/11/16 225 lb (102.1 kg)  08/04/16 223 lb 8 oz (101.4 kg)  07/14/16 225 lb (102.1 kg)  Psych/Mental status: Alert, oriented x 3 (person, place, & time)       Eyes: PERLA Respiratory: No evidence of acute respiratory distress  Cervical Spine Exam  Inspection: No masses, redness, or swelling Alignment: Symmetrical Functional ROM: Unrestricted ROM Stability: No instability detected Muscle strength & Tone: Functionally intact Sensory: Unimpaired Palpation: No palpable anomalies  Upper Extremity (UE) Exam    Side: Right upper extremity  Side: Left upper extremity  Inspection: No masses, redness, swelling, or asymmetry. No contractures  Inspection: No masses, redness, swelling, or asymmetry. No contractures  Functional ROM: Unrestricted ROM          Functional ROM: Unrestricted ROM          Muscle strength & Tone: Functionally intact  Muscle strength & Tone: Functionally intact  Sensory: Unimpaired  Sensory:  Unimpaired  Palpation: No palpable anomalies  Palpation: No palpable anomalies  Specialized Test(s): Deferred         Specialized Test(s): Deferred          Thoracic Spine Exam  Inspection: No masses, redness, or swelling Alignment: Symmetrical Functional ROM: Unrestricted ROM Stability: No instability detected Sensory: Unimpaired Muscle strength & Tone: No palpable anomalies  Lumbar Spine Exam  Inspection: No masses, redness, or swelling Alignment: Symmetrical Functional ROM: Unrestricted ROM Stability: No instability detected Muscle strength & Tone: Functionally intact Sensory: Unimpaired Palpation: No palpable anomalies Provocative Tests: Lumbar Hyperextension and rotation test: evaluation deferred today       Mark [name]'s Maneuver: evaluation deferred today              Gait & Posture Assessment  Ambulation: Unassisted Gait: Relatively normal for age and body habitus Posture: WNL   Lower Extremity Exam    Side: Right lower extremity  Side: Left lower extremity  Inspection: No masses, redness, swelling, or asymmetry. No contractures  Inspection: No masses, redness, swelling, or asymmetry. No contractures  Functional ROM: Unrestricted ROM          Functional ROM: Unrestricted ROM          Muscle strength & Tone: Functionally intact  Muscle strength & Tone: Functionally intact  Sensory: Unimpaired  Sensory: Unimpaired  Palpation: No palpable anomalies  Palpation: No palpable anomalies   Assessment  Primary Diagnosis & Pertinent Problem List: The primary encounter diagnosis was Chronic pain syndrome. Diagnoses of Chronic neck pain (Location of Primary Source of Pain) (Bilateral) (L>R), Chronic cervical radicular pain, Long term current use of opiate analgesic, and Opiate use (60 MME/Day) were also pertinent to this visit.  Status Diagnosis  Controlled Controlled Controlled 1. Chronic pain syndrome   2. Chronic neck pain (Location of Primary Source of Pain) (Bilateral) (L>R)    3. Chronic cervical radicular pain   4. Long term current use of opiate analgesic   5. Opiate use (60 MME/Day)  Plan of Care  Pharmacotherapy (Medications Ordered): Meds ordered this encounter  Medications  . oxyCODONE (OXYCONTIN) 10 mg 12 hr tablet    Sig: Take 1 tablet (10 mg total) by mouth every 12 (twelve) hours.    Dispense:  60 tablet    Refill:  0    Do not add this medication to the electronic "Automatic Refill" notification system. Patient may have prescription filled one day early if pharmacy is closed on scheduled refill date. Do not fill until: 12/25/16 To last until: 01/24/17  . oxyCODONE (OXY IR/ROXICODONE) 5 MG immediate release tablet    Sig: Take 1 tablet (5 mg total) by mouth every 6 (six) hours as needed for severe pain.    Dispense:  120 tablet    Refill:  0    Do not add this medication to the electronic "Automatic Refill" notification system. Patient may have prescription filled one day early if pharmacy is closed on scheduled refill date. Do not fill until: 11/25/16 To last until: 12/25/16  . oxyCODONE (OXY IR/ROXICODONE) 5 MG immediate release tablet    Sig: Take 1 tablet (5 mg total) by mouth every 6 (six) hours as needed for severe pain.    Dispense:  120 tablet    Refill:  0    Do not add this medication to the electronic "Automatic Refill" notification system. Patient may have prescription filled one day early if pharmacy is closed on scheduled refill date. Do not fill until: 12/25/16 To last until: 01/24/17  . oxyCODONE (OXY IR/ROXICODONE) 5 MG immediate release tablet    Sig: Take 1 tablet (5 mg total) by mouth every 6 (six) hours as needed for severe pain.    Dispense:  120 tablet    Refill:  0    Do not add this medication to the electronic "Automatic Refill" notification system. Patient may have prescription filled one day early if pharmacy is closed on scheduled refill date. Do not fill until: 01/24/17 To last until: 02/23/17  . oxyCODONE  (OXYCONTIN) 10 mg 12 hr tablet    Sig: Take 1 tablet (10 mg total) by mouth every 12 (twelve) hours.    Dispense:  60 tablet    Refill:  0    Do not add this medication to the electronic "Automatic Refill" notification system. Patient may have prescription filled one day early if pharmacy is closed on scheduled refill date. Do not fill until: 11/25/16 To last until: 12/25/16  . oxyCODONE (OXYCONTIN) 10 mg 12 hr tablet    Sig: Take 1 tablet (10 mg total) by mouth every 12 (twelve) hours.    Dispense:  60 tablet    Refill:  0    Do not add this medication to the electronic "Automatic Refill" notification system. Patient may have prescription filled one day early if pharmacy is closed on scheduled refill date. Do not fill until: 01/24/17 To last until: 02/23/17   New Prescriptions   No medications on file   Medications administered today: Mark [name] had no medications administered during this visit. Lab-work, procedure(s), and/or referral(s): No orders of the defined types were placed in this encounter.  Imaging and/or referral(s): None  Interventional therapies: Planned, scheduled, and/or pending:   Not at this time.   Considering:   Complete a series of 5 Hyalgan intra-articular knee injections, bilaterally.  Diagnostic bilateral cervical facet block  Possible bilateral cervical facet radiofrequency ablation  Diagnostic left sided cervical epidural steroid injection  Diagnostic bilateral intra-articular knee injection  Diagnostic bilateral genicular nerve blocks  Possible bilateral genicular nerve radiofrequency ablation  Diagnostic bilateral sacroiliac joint block  Possible bilateral sacroiliac joint radiofrequency ablation  Diagnostic bilateral intra-articular shoulder joint injection  Diagnostic bilateral suprascapular nerve block  Possible bilateral suprascapular nerve radiofrequency ablation  Diagnostic bilateral lumbar facet block  Possible bilateral lumbar facet  radiofrequency ablation  Diagnostic left L4-5 lumbar epidural steroid injection    Palliative PRN treatment(s):   Bilateral intra-articular Hyalgan knee injection series (injection #4)  Diagnostic bilateral cervical facet block  Diagnostic left sided cervical epidural steroid injection  Diagnostic bilateral intra-articular knee injection  Diagnostic bilateral genicular nerve blocks  Diagnostic bilateral sacroiliac joint block  Diagnostic bilateral intra-articular shoulder joint injection  Diagnostic bilateral suprascapular nerve block  Diagnostic bilateral lumbar facet block   Diagnostic left L4-5 lumbar epidural steroid injection    Provider-requested follow-up: Return in 3 months (on 02/08/2017) for (Nurse Practitioner) Med-Mgmt.  Future Appointments Date Time Provider [place]  02/08/2017 11:00 AM Vevelyn Francois, NP [facility] None  02/08/2017 1:00 PM Ria Bush, MD [id number]   Primary Care Physician: Ria Bush, MD Location: [facility] Note by: Beatriz Chancellor A. Dossie Arbour, M.D, DABA, DABAPM, DABPM, DABIPP, FIPP Date: 10/26/2016; Time: 3:12 PM  Pain Score Disclaimer: We use the NRS-11 scale. This is a self-reported, subjective measurement of pain severity with only modest accuracy. It is used primarily to identify changes within a particular patient. It must be understood that outpatient pain scales are significantly less accurate that those used for research, where they can be applied under ideal controlled circumstances with minimal exposure to variables. In reality, the score is likely to be a combination of pain intensity and pain affect, where pain affect describes the degree of emotional arousal or changes in action readiness caused by the sensory experience of pain. Factors such as social and work situation, setting, emotional state, anxiety levels, expectation, and prior pain experience may influence pain perception and show large  inter-individual differences that may also be affected by time variables.  Patient instructions provided during this appointment: Patient Instructions  Pain Management Discharge Instructions  General Discharge Instructions :  If you need to reach your doctor call: Monday-Friday 8:00 am - 4:00 pm at [phone] or toll free [phone].  After clinic hours [phone] to have operator reach doctor.  Bring all of your medication bottles to all your appointments in the pain clinic.  To cancel or reschedule your appointment with Pain Management please remember to call 24 hours in advance to avoid a fee.  Refer to the educational materials which you have been given on: General Risks, I had my Procedure. Discharge Instructions, Post Sedation.  Post Procedure Instructions:  The drugs you were given will stay in your system until tomorrow, so for the next 24 hours you should not drive, make any legal decisions or drink any alcoholic beverages.  You may eat anything you prefer, but it is better to start with liquids then soups and crackers, and gradually work up to solid foods.  Please notify your doctor immediately if you have any unusual bleeding, trouble breathing or pain that is not related to your normal pain.  Depending on the type of procedure that was done, some parts of your body may feel week and/or numb.  This usually clears up by tonight or the next day.  Walk with the use of an assistive device or accompanied by an adult for the 24 hours.  You may use ice  on the affected area for the first 24 hours.  Put ice in a Ziploc bag and cover with a towel and place against area 15 minutes on 15 minutes off.  You may switch to heat after 24 hours.

## 2016-10-26 NOTE — Progress Notes (Signed)
Nursing Pain Medication Assessment:  Safety precautions to be maintained throughout the outpatient stay will include: orient to surroundings, keep bed in low position, maintain call bell within reach at all times, provide assistance with transfer out of bed and ambulation.  Medication Inspection Compliance: Pill count conducted under aseptic conditions, in front of the patient. Neither the pills nor the bottle was removed from the patient's sight at any time. Once count was completed pills were immediately returned to the patient in their original bottle.  Medication #1: Oxycodone IR 10 mg Pill/Patch Count: 27 of 60 pills remain Bottle Appearance: Standard pharmacy container. Clearly labeled. Filled Date:02 / 20 / 2018 Last Medication intake:  Today  Medication #2: Oxycodone IR 5 mg Pill/Patch Count: 37 of 120 pills remain Bottle Appearance: Standard pharmacy container. Clearly labeled. Filled Date02 / 20 / 2018 Last Medication intake:  Today

## 2016-10-27 ENCOUNTER — Other Ambulatory Visit: Payer: Self-pay | Admitting: *Deleted

## 2016-10-27 MED ORDER — NAPROXEN 500 MG PO TABS: ORAL_TABLET | ORAL | 1 refills | Status: DC

## 2016-11-14 DIAGNOSIS — I495 Sick sinus syndrome: Secondary | ICD-10-CM | POA: Diagnosis not present

## 2017-02-01 ENCOUNTER — Other Ambulatory Visit: Payer: Self-pay

## 2017-02-01 MED ORDER — FLUTICASONE PROPIONATE 50 MCG/ACT NA SUSP: 2.0000 | Freq: Every day | NASAL | 1 refills | Status: DC

## 2017-02-01 NOTE — Telephone Encounter (Signed)
[facility] left v/m requesting refill fluticasone nasal spray. Last annual 08/11/16; refilled per protocol.

## 2017-02-05 NOTE — Progress Notes (Signed)
Patient's Name: Mark [name]  MRN: [id number]  Referring Provider: Ria Bush, MD  DOB: Sep 13, 1955  PCP: Ria Bush, MD  DOS: 02/06/2017  Note by: Vevelyn Francois NP  Service setting: Ambulatory outpatient  Specialty: Interventional Pain Management  Location: [facility] (AMB) Pain Management Facility    Patient type: Established    Primary Reason(s) for Visit: Encounter for prescription drug management. (Level of risk: moderate)  CC: Neck Pain and Shoulder Pain  HPI  Mark [name] is a 61 y.o. year old, male patient, who comes today for a medication management evaluation. He has Chronic pain syndrome; MDD (major depressive disorder), recurrent episode, moderate ([place]); Insomnia; GAD (generalized anxiety disorder); Medicare annual wellness visit, subsequent; Hyperlipidemia; Thrombocytopenia ([place]); Obesity, Class II, BMI 35-39.9, with comorbidity ([place]); Health maintenance examination; Hydrocele in adult; Candidal dermatitis; Benign prostatic hyperplasia with urinary obstruction; Nephrolithiasis; Bradycardia; Cardiac murmur; Opiate use (60 MME/Day); Encounter for therapeutic drug level monitoring; Chronic neck pain (Location of Primary Source of Pain) (Bilateral) (L>R); Chronic shoulder pain (Bilateral) (L>R); Chronic knee pain (Bilateral) (R>L); History of permanent cardiac pacemaker placement; Opioid-induced constipation (OIC); Radicular pain of shoulder (Bilateral) (L>R); 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); Cervicogenic headache; Cervical spondylosis with radiculopathy (Bilateral) (L>R); Cervical facet syndrome (Bilateral) (L>R); Cervical foraminal stenosis (Severe C6-7) (Left); Chronic sacroiliac joint pain (Bilateral); Osteoarthritis of sacroiliac joint (Bilateral); Lumbar facet hypertrophy (L1-2, L2-3, and L4-5) (Bilateral); Lumbar IVDD (intervertebral disc displacement); 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); Lumbar facet syndrome (Bilateral); Osteoarthritis of shoulder  (Left); Osteoarthritis of knee (Bilateral) (R>L); Obstructive sleep apnea; Hypocitraturia; Long term current use of opiate analgesic; Chronic cervical radicular pain; and Advanced care planning/counseling discussion on his problem list. His primarily concern today is the Neck Pain and Shoulder Pain  Pain Assessment: Location: Right, Left Neck Radiating: radiates into both shoulders Onset: More than a month ago Duration: Chronic pain Quality: Aching, Sharp, Stabbing, Shooting, Burning, Dull Severity: 2 /10 (self-reported pain score)  Note: Reported level is compatible with observation.                   Effect on ADL:   Timing: Constant Modifying factors: getting off feet for a couple of hours  Mark [name] was last scheduled for an appointment on 10/26/16 for medication management. During today's appointment we reviewed Mark [name]'s chronic pain status, as well as his outpatient medication regimen. He has chronic neck pain. He has raicualr symptoms into his left arm. He has numbness and tingling in the arm.  He states that the more he is up on his feet the worse the pain gets. He has to elevate his feet for 2 hours .   The patient  reports that he does not use drugs. His body mass index is 35.24 kg/m.  Further details on both, my assessment(s), as well as the proposed treatment plan, please see below.  Controlled Substance Pharmacotherapy Assessment REMS (Risk Evaluation and Mitigation Strategy)  Analgesic:Oxycodone ER 10 mg every 12 hours (20 mg/day) + oxycodone IR 5 mg every 6 hours (20 mg/day) MME/day:60 mg/day.  Mark Shorter, RN  02/06/2017 10:56 AM  Signed Nursing Pain Medication Assessment:  Safety precautions to be maintained throughout the outpatient stay will include: orient to surroundings, keep bed in low position, maintain call bell within reach at all times, provide assistance with transfer out of bed and ambulation.  Medication Inspection Compliance: Pill count conducted under  aseptic conditions, in front of the patient. Neither the pills nor the  bottle was removed from the patient's sight at any time. Once count was completed pills were immediately returned to the patient in their original bottle.  Medication #1: Oxycodone IR Pill/Patch Count: 39 of 120 pills remain Pill/Patch Appearance: Markings consistent with prescribed medication Bottle Appearance: Standard pharmacy container. Clearly labeled. Filled Date: 06 / 20 / 2018 Last Medication intake:  Yesterday  Medication #2: Oxycodone ER (OxyContin) Pill/Patch Count: 55 of 60 pills remain Pill/Patch Appearance: Markings consistent with prescribed medication Bottle Appearance: Standard pharmacy container. Clearly labeled. Filled Date: 06 / 20 / 2018 Last Medication intake:  Today   Pharmacokinetics: Liberation and absorption (onset of action): WNL Distribution (time to peak effect): WNL Metabolism and excretion (duration of action): WNL         Pharmacodynamics: Desired effects: Analgesia: Mark [name] reports >50% benefit. Functional ability: Patient reports that medication allows him to accomplish basic ADLs Clinically meaningful improvement in function (CMIF): Sustained CMIF goals met Perceived effectiveness: Described as relatively effective, allowing for increase in activities of daily living (ADL) Undesirable effects: Side-effects or Adverse reactions: None reported Monitoring: [place] PMP: Online review of the past 22-monthperiod conducted. Compliant with practice rules and regulations List of all UDS test(s) done:  Lab Results  Component Value Date   TOXASSSELUR FINAL 05/15/2016   TDupreeFINAL 02/02/2016   TAmeryFINAL 11/29/2015   [place]FINAL 09/01/2015   Last UDS on record: ToxAssure Select 13  Date Value Ref Range Status  05/15/2016 FINAL  Final    Comment:    ==================================================================== TOXASSURE SELECT 13  (MW) ==================================================================== Test                             Result       Flag       Units Drug Present and Declared for Prescription Verification   Oxycodone                      3650         EXPECTED   ng/mg creat   Noroxycodone                   3374         EXPECTED   ng/mg creat    Sources of oxycodone include scheduled prescription medications.    Noroxycodone is an expected metabolite of oxycodone. Drug Present not Declared for Prescription Verification   7-aminoclonazepam              467          UNEXPECTED ng/mg creat    7-aminoclonazepam is an expected metabolite of clonazepam. Source    of clonazepam is a scheduled prescription medication. ==================================================================== Test                      Result    Flag   Units      Ref Range   Creatinine              42               mg/dL      >=20 ==================================================================== Declared Medications:  The flagging and interpretation on this report are based on the  following declared medications.  Unexpected results may arise from  inaccuracies in the declared medications.  **Note: The testing scope of this panel includes these medications:  Oxycodone  Oxycodone (OxyContin)  **Note: The testing scope  of this panel does not include following  reported medications:  Naloxone  Naproxen  Omega-3 Fatty Acids (Fish Oil)  Omeprazole (Prilosec)  Oxybutynin (Ditropan)  Sildenafil (Revatio)  Supplement (Saw Palmetto) ==================================================================== For clinical consultation, please call [phone]. ====================================================================    UDS interpretation: Compliant          Medication Assessment Form: Reviewed. Patient indicates being compliant with therapy Treatment compliance: Compliant Risk Assessment Profile: Aberrant behavior: See  prior evaluations. None observed or detected today Comorbid factors increasing risk of overdose: See prior notes. No additional risks detected today Risk of substance use disorder (SUD): Low Opioid Risk Tool (ORT) Total Score: 0  Interpretation Table:  Score <3 = Low Risk for SUD  Score between 4-7 = Moderate Risk for SUD  Score >8 = High Risk for Opioid Abuse   Risk Mitigation Strategies:  Patient Counseling: Covered Patient-Prescriber Agreement (PPA): Present and active  Notification to other healthcare providers: Done  Pharmacologic Plan: No change in therapy, at this time  Laboratory Chemistry  Inflammation Markers (CRP: Acute Phase) (ESR: Chronic Phase) Lab Results  Component Value Date   CRP 1.7 (H) 09/01/2015   ESRSEDRATE 6 09/01/2015                 Renal Function Markers Lab Results  Component Value Date   BUN 18 08/04/2016   CREATININE 1.08 08/04/2016   GFRAA >60 09/01/2015   GFRNONAA >60 09/01/2015                 Hepatic Function Markers Lab Results  Component Value Date   AST 24 12/29/2015   ALT 23 12/29/2015   ALBUMIN 4.2 12/29/2015   ALKPHOS 58 12/29/2015   HCVAB NEGATIVE 07/22/2013                 Electrolytes Lab Results  Component Value Date   NA 140 08/04/2016   K 4.0 08/04/2016   CL 102 08/04/2016   CALCIUM 9.1 08/04/2016   MG 2.1 09/01/2015                 Neuropathy Markers Lab Results  Component Value Date   VITAMINB12 861 02/02/2016                 Bone Pathology Markers Lab Results  Component Value Date   ALKPHOS 58 12/29/2015   25OHVITD1 63 02/02/2016   25OHVITD2 <1.0 02/02/2016   25OHVITD3 63 02/02/2016   CALCIUM 9.1 08/04/2016                 Coagulation Parameters Lab Results  Component Value Date   INR 1.0 06/03/2007   LABPROT 13.7 06/03/2007   APTT 29 06/03/2007   PLT 164.0 08/04/2016                 Cardiovascular Markers Lab Results  Component Value Date   HGB 14.9 08/04/2016   HCT 42.0 08/04/2016                  Note: Lab results reviewed.  Recent Diagnostic Imaging Review  Dg C-arm 1-60 Min-no Report  Result Date: 07/06/2016 Fluoroscopy was utilized by the requesting physician.  No radiographic interpretation.   Note: Imaging results reviewed.          Meds   Current Meds  Medication Sig  . aspirin 81 MG tablet Take 81 mg by mouth daily.    Marland Kitchen atorvastatin (LIPITOR) 20 MG tablet Take 1 tablet (20 mg total) by  mouth daily.  . cetirizine (ZYRTEC) 10 MG tablet Take 10 mg by mouth daily. As needed  . Cholecalciferol (VITAMIN D) 2000 units CAPS Take 1 capsule by mouth daily.  . clindamycin (CLEOCIN) 150 MG capsule Take 150 mg by mouth as needed. 4 caps 1 hour before dental procedures.   . clonazePAM (KLONOPIN) 1 MG tablet TAKE 1 BY MOUTH 3 TIMES DAILY AS NEEDED FOR ANXIETY  . CRANBERRY PO Take 4,200 mg by mouth daily.   Marland Kitchen desonide (DESOWEN) 0.05 % ointment Apply topically 2 (two) times daily. (Patient taking differently: Apply topically as needed. )  . FLUoxetine (PROZAC) 20 MG capsule TAKE 1 BY MOUTH DAILY  . FLUoxetine (PROZAC) 40 MG capsule TAKE 1 BY MOUTH DAILY  . fluticasone (FLONASE) 50 MCG/ACT nasal spray Place 2 sprays into both nostrils daily.  . furosemide (LASIX) 20 MG tablet TAKE 1 BY MOUTH DAILY  . Garlic 7342 MG CAPS Take 1 capsule by mouth daily.    . Multiple Vitamin (MULTIVITAMIN PO) Take 1 tablet by mouth daily.    . Naloxone HCl (NARCAN) 4 MG/0.1ML LIQD Spray into one nostril. Repeat with second device into other nostril after 2-3 minutes if no or minimal response.  . naproxen (NAPROSYN) 500 MG tablet TAKE 1 BY MOUTH TWICE DAILY WITH A MEAL  . Omega-3 Fatty Acids (FISH OIL) 1200 MG CAPS Take 1,200 mg by mouth daily.   Marland Kitchen omeprazole (PRILOSEC) 20 MG capsule TAKE 1 BY MOUTH DAILY  . polyethylene glycol powder (MIRALAX) powder Take 1 Container by mouth as needed.   . potassium citrate (UROCIT-K) 10 MEQ (1080 MG) SR tablet Take 10 mEq by mouth 2 (two) times daily.  .  Saw Palmetto, Serenoa repens, (SAW PALMETTO PO) Take 450 mg by mouth daily.   . sildenafil (REVATIO) 20 MG tablet 3-5 tablets daily as needed  . VITAMIN B COMPLEX-C CAPS Take by mouth daily.   . vitamin E 1000 UNIT capsule     ROS  Constitutional: Denies any fever or chills Gastrointestinal: No reported hemesis, hematochezia, vomiting, or acute GI distress Musculoskeletal: Denies any acute onset joint swelling, redness, loss of ROM, or weakness Neurological: No reported episodes of acute onset apraxia, aphasia, dysarthria, agnosia, amnesia, paralysis, loss of coordination, or loss of consciousness  Allergies  Mr. Tout is allergic to ciprofloxacin; metformin; penicillin v potassium; penicillins; buprenorphine hcl; and morphine and related.  [place]  Drug: Mr. Kent  reports that he does not use drugs. Alcohol:  reports that he does not drink alcohol. Tobacco:  reports that he has never smoked. He has never used smokeless tobacco. Medical:  has a past medical history of Anxiety; Chronic pain; Depression; History of kidney stones (April 12,2017); History of permanent cardiac pacemaker placement (05/31/2015); Kidney stones; Migraine; Prostatitis; and Sleep apnea. Surgical: Mr. Cull  has a past surgical history that includes Tonsillectomy (1964); Carpal tunnel release (Bilateral, 2006); Ulnar nerve repair (2008); Cervical spine surgery (2008); Rotator cuff repair (Left, 2009); pacemaker placement (2009); Lithotripsy (Bilateral, 2012); Lithotripsy (Left, 2017); Cardiovascular stress test (2014); and Colonoscopy (07/2007). Family: family history includes Arthritis in his father and mother; Cancer in his father and mother; Diabetes in his mother; Heart block in his father; Heart disease in his father and mother.  Constitutional Exam  General appearance: Well nourished, well developed, and well hydrated. In no apparent acute distress Vitals:   02/06/17 1043  BP: 130/80  Pulse: 83  Resp: 18  Temp:  97.8 F (36.6 C)  SpO2:  94%  Weight: 225 lb (102.1 kg)  Height: 5' 7" (1.702 m)   BMI Assessment: Estimated body mass index is 35.24 kg/m as calculated from the following:   Height as of this encounter: 5' 7" (1.702 m).   Weight as of this encounter: 225 lb (102.1 kg).  BMI interpretation table: BMI level Category Range association with higher incidence of chronic pain  <18 kg/m2 Underweight   18.5-24.9 kg/m2 Ideal body weight   25-29.9 kg/m2 Overweight Increased incidence by 20%  30-34.9 kg/m2 Obese (Class I) Increased incidence by 68%  35-39.9 kg/m2 Severe obesity (Class II) Increased incidence by 136%  >40 kg/m2 Extreme obesity (Class III) Increased incidence by 254%   BMI Readings from Last 4 Encounters:  02/06/17 35.24 kg/m  10/26/16 35.24 kg/m  08/11/16 35.24 kg/m  08/04/16 35.01 kg/m   Wt Readings from Last 4 Encounters:  02/06/17 225 lb (102.1 kg)  10/26/16 225 lb (102.1 kg)  08/11/16 225 lb (102.1 kg)  08/04/16 223 lb 8 oz (101.4 kg)  Psych/Mental status: Alert, oriented x 3 (person, place, & time)       Eyes: PERLA Respiratory: No evidence of acute respiratory distress  Cervical Spine Exam  Inspection: No masses, redness, or swelling Alignment: Symmetrical Functional ROM: Decreased ROM      Stability: No instability detected Muscle strength & Tone: Functionally intact Sensory: Unimpaired Palpation: Complains of area being tender to palpation              Upper Extremity (UE) Exam    Side: Right upper extremity  Side: Left upper extremity  Inspection: No masses, redness, swelling, or asymmetry. No contractures  Inspection: No masses, redness, swelling, or asymmetry. No contractures  Functional ROM: Unrestricted ROM          Functional ROM: Unrestricted ROM          Muscle strength & Tone: Functionally intact  Muscle strength & Tone: Functionally intact  Sensory: Unimpaired  Sensory: Unimpaired  Palpation: No palpable anomalies              Palpation: No  palpable anomalies              Specialized Test(s): Deferred         Specialized Test(s): Deferred          Thoracic Spine Exam  Inspection: No masses, redness, or swelling Alignment: Symmetrical Functional ROM: Unrestricted ROM Stability: No instability detected Sensory: Unimpaired Muscle strength & Tone: No palpable anomalies  Lumbar Spine Exam  Inspection: No masses, redness, or swelling Alignment: Symmetrical Functional ROM: Unrestricted ROM      Stability: No instability detected Muscle strength & Tone: Functionally intact Sensory: Unimpaired Palpation: No palpable anomalies       Provocative Tests: Lumbar Hyperextension and rotation test: evaluation deferred today       Patrick's Maneuver: evaluation deferred today                    Gait & Posture Assessment  Ambulation: Unassisted Gait: Relatively normal for age and body habitus Posture: WNL   Lower Extremity Exam    Side: Right lower extremity  Side: Left lower extremity  Inspection: No masses, redness, swelling, or asymmetry. No contractures  Inspection: No masses, redness, swelling, or asymmetry. No contractures  Functional ROM: Unrestricted ROM          Functional ROM: Unrestricted ROM          Muscle strength & Tone: Functionally intact  Muscle  strength & Tone: Functionally intact  Sensory: Unimpaired  Sensory: Unimpaired  Palpation: No palpable anomalies  Palpation: No palpable anomalies   Assessment  Primary Diagnosis & Pertinent Problem List: The primary encounter diagnosis was Cervical spondylosis with radiculopathy (Bilateral) (L>R). Diagnoses of Cervical facet syndrome (Bilateral) (L>R), Chronic cervical radicular pain, Chronic pain syndrome, and Long term current use of opiate analgesic were also pertinent to this visit.  Status Diagnosis  Controlled Controlled Controlled 1. Cervical spondylosis with radiculopathy (Bilateral) (L>R)   2. Cervical facet syndrome (Bilateral) (L>R)   3. Chronic cervical  radicular pain   4. Chronic pain syndrome   5. Long term current use of opiate analgesic     Problems updated and reviewed during this visit: Problem  Chronic cervical radicular pain  Cervical foraminal stenosis (Severe C6-7) (Left)  Chronic sacroiliac joint pain (Bilateral)  Osteoarthritis of sacroiliac joint (Bilateral)  Lumbar facet hypertrophy (L1-2, L2-3, and L4-5) (Bilateral)  Lumbar IVDD (intervertebral disc displacement)   L3-4: Leftward disc bulging. L4-5: Broad-based disc herniation. L5-S1: Central disc protrusion.   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)  Lumbar facet syndrome (Bilateral)  Osteoarthritis of shoulder (Left)   Impingement type anatomy. Tendinosis of the rotator cuff.   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 Straightened cervical lordosis without acute fracture nor malalignment. C6-7 adjacent segment disease resulting in severe left C6-7 neural foraminal narrowing. No canal stenosis.   Cervical facet syndrome (Bilateral) (L>R)  Chronic neck pain (Location of Primary Source of Pain) (Bilateral) (L>R)  Chronic shoulder pain (Bilateral) (L>R)  Chronic knee pain (Bilateral) (R>L)  Opioid-induced constipation (OIC)  Chronic Pain Syndrome  Long Term Current Use of Opiate Analgesic  Opiate use (60 MME/Day)   Oxycodone ER 10 mg every 12 hours (20 mg/day) + oxycodone IR 5 mg every 6 hours (20 mg/day)   Advanced Care Planning/Counseling Discussion  Obstructive Sleep Apnea  Hypocitraturia  Encounter for Therapeutic Drug Level Monitoring  History of Permanent Cardiac Pacemaker Placement  Health Maintenance Examination  Hydrocele in Adult  Candidal Dermatitis  Bradycardia   Overview:  s/p dual chamber pacemaker placement    Cardiac Murmur  Obesity, Class II, BMI 35-39.9, with comorbidity (HCC)  Benign Prostatic Hyperplasia With Urinary Obstruction  Nephrolithiasis   H/o hydronephrosis   Medicare Annual Wellness Visit, Subsequent  Hyperlipidemia  Thrombocytopenia (HCC)  Gad (Generalized Anxiety Disorder)  Insomnia  Mdd (Major Depressive Disorder), Recurrent Episode, Moderate (H[facility])   Plan of Care  Pharmacotherapy (Medications Ordered): Meds ordered this encounter  Medications  . oxyCODONE (OXY IR/ROXICODONE) 5 MG immediate release tablet    Sig: Take 1 tablet (5 mg total) by mouth every 6 (six) hours as needed for severe pain.    Dispense:  120 tablet    Refill:  0    Do not add this medication to the electronic "Automatic Refill" notification system. Patient may have prescription filled one day early if pharmacy is closed on scheduled refill date. Do not fill until: 02/23/17 To last until: 03/25/17    Order Specific Question:   Supervising Provider    Answer:   Milinda Pointer [phone]  . oxyCODONE (OXY IR/ROXICODONE) 5 MG immediate release tablet    Sig: Take 1 tablet (5 mg total) by mouth every 6 (six) hours as needed for severe pain.  Dispense:  120 tablet    Refill:  0    Do not add this medication to the electronic "Automatic Refill" notification system. Patient may have prescription filled one day early if pharmacy is closed on scheduled refill date. Do not fill until: 03/25/17 To last until: 04/24/17    Order Specific Question:   Supervising Provider    Answer:   Milinda Pointer [phone]  . oxyCODONE (OXY IR/ROXICODONE) 5 MG immediate release tablet    Sig: Take 1 tablet (5 mg total) by mouth every 6 (six) hours as needed for severe pain.    Dispense:  120 tablet    Refill:  0    Do not add this medication to the electronic "Automatic Refill" notification system. Patient may have prescription filled one day early if pharmacy is closed on scheduled refill date. Do not fill until:  04/24/17 To last until: 05/24/17    Order Specific Question:   Supervising Provider    Answer:   Milinda Pointer [phone]  . oxyCODONE (OXYCONTIN) 10 mg 12 hr tablet    Sig: Take 1 tablet (10 mg total) by mouth every 12 (twelve) hours.    Dispense:  60 tablet    Refill:  0    Do not add this medication to the electronic "Automatic Refill" notification system. Patient may have prescription filled one day early if pharmacy is closed on scheduled refill date. Do not fill until: 03/25/17 To last until: 04/24/17    Order Specific Question:   Supervising Provider    Answer:   Milinda Pointer [phone]  . oxyCODONE (OXYCONTIN) 10 mg 12 hr tablet    Sig: Take 1 tablet (10 mg total) by mouth every 12 (twelve) hours.    Dispense:  60 tablet    Refill:  0    Do not add this medication to the electronic "Automatic Refill" notification system. Patient may have prescription filled one day early if pharmacy is closed on scheduled refill date. Do not fill until: 04/24/17 To last until: 05/24/17    Order Specific Question:   Supervising Provider    Answer:   Milinda Pointer [phone]  . oxyCODONE (OXYCONTIN) 10 mg 12 hr tablet    Sig: Take 1 tablet (10 mg total) by mouth every 12 (twelve) hours.    Dispense:  60 tablet    Refill:  0    Do not add this medication to the electronic "Automatic Refill" notification system. Patient may have prescription filled one day early if pharmacy is closed on scheduled refill date. Do not fill until: 02/23/17 To last until: 03/25/17    Order Specific Question:   Supervising Provider    Answer:   Milinda Pointer [phone]   New Prescriptions   No medications on file   Medications administered today: Mr. Reim had no medications administered during this visit. Lab-work, procedure(s), and/or referral(s): Orders Placed This Encounter  Procedures  . ToxASSURE Select 13 (MW), Urine   Imaging and/or referral(s): None  Interventional  therapies: Planned, scheduled, and/or pending:   Not at this time.   Considering:   Complete a series of 5 Hyalgan intra-articular knee injections, bilaterally.  Diagnostic bilateral cervical facet block  Possible bilateral cervical facet radiofrequency ablation  Diagnostic left sided cervical epidural steroid injection  Diagnostic bilateral intra-articular knee injection  Diagnostic bilateral genicular nerve blocks  Possible bilateral genicular nerve radiofrequency ablation  Diagnostic bilateral sacroiliac joint block  Possible bilateral sacroiliac joint radiofrequency ablation  Diagnostic bilateral intra-articular shoulder joint injection  Diagnostic bilateral suprascapular nerve block  Possible bilateral suprascapular nerve radiofrequency ablation  Diagnostic bilateral lumbar facet block  Possible bilateral lumbar facet radiofrequency ablation  Diagnostic left L4-5 lumbar epidural steroid injection    Palliative PRN treatment(s):   Bilateral intra-articular Hyalgan knee injection series (injection #4)  Diagnostic bilateral cervical facet block  Diagnostic left sided cervical epidural steroid injection  Diagnostic bilateral intra-articular knee injection  Diagnostic bilateral genicular nerve blocks  Diagnostic bilateral sacroiliac joint block  Diagnostic bilateral intra-articular shoulder joint injection  Diagnostic bilateral suprascapular nerve block  Diagnostic bilateral lumbar facet block   Diagnostic left L4-5 lumbar epidural steroid injection    Provider-requested follow-up: Return in about 3 months (around 05/09/2017) for MedMgmt.  Future Appointments Date Time Provider Roy  02/08/2017 1:00 PM Ria Bush, MD [id number]  05/09/2017 11:30 AM Vevelyn Francois, NP [facility] None   Primary Care Physician: Ria Bush, MD Location: [facility] Facility Note by: Vevelyn Francois NP Date: 02/06/2017; Time: 2:46 PM  Pain  Score Disclaimer: We use the NRS-11 scale. This is a self-reported, subjective measurement of pain severity with only modest accuracy. It is used primarily to identify changes within a particular patient. It must be understood that outpatient pain scales are significantly less accurate that those used for research, where they can be applied under ideal controlled circumstances with minimal exposure to variables. In reality, the score is likely to be a combination of pain intensity and pain affect, where pain affect describes the degree of emotional arousal or changes in action readiness caused by the sensory experience of pain. Factors such as social and work situation, setting, emotional state, anxiety levels, expectation, and prior pain experience may influence pain perception and show large inter-individual differences that may also be affected by time variables.  Patient instructions provided during this appointment: Patient Instructions   ____________________________________________________________________________________________  Medication Rules  Applies to: All patients receiving prescriptions (written or electronic).  Pharmacy of record: Pharmacy where electronic prescriptions will be sent. If written prescriptions are taken to a different pharmacy, please inform the nursing staff. The pharmacy listed in the electronic medical record should be the one where you would like electronic prescriptions to be sent.  Prescription refills: Only during scheduled appointments. Applies to both, written and electronic prescriptions.  NOTE: The following applies primarily to controlled substances (Opioid* Pain Medications).   Patient's responsibilities: 1. Pain Pills: Bring all pain pills to every appointment (except for procedure appointments). 2. Pill Bottles: Bring pills in original pharmacy bottle. Always bring newest bottle. Bring bottle, even if empty. 3. Medication refills: You are responsible  for knowing and keeping track of what medications you need refilled. The day before your appointment, write a list of all prescriptions that need to be refilled. Bring that list to your appointment and give it to the admitting nurse. Prescriptions will be written only during appointments. If you forget a medication, it will not be "Called in", "Faxed", or "electronically sent". You will need to get another appointment to get these prescribed. 4. Prescription Accuracy: You are responsible for carefully inspecting your prescriptions before leaving our office. Have the discharge nurse carefully go over each prescription with you, before taking them home. Make sure that your name is accurately spelled, that your address is correct. Check the name and dose of your medication to make sure it is accurate. Check the number of pills, and the written instructions to make sure they are clear and accurate. Make sure  that you are given enough medication to last until your next medication refill appointment. 5. Taking Medication: Take medication as prescribed. Never take more pills than instructed. Never take medication more frequently than prescribed. Taking less pills or less frequently is permitted and encouraged, when it comes to controlled substances (written prescriptions).  6. Inform other Doctors: Always inform, all of your healthcare providers, of all the medications you take. 7. Pain Medication from other Providers: You are not allowed to accept any additional pain medication from any other Doctor or Healthcare provider. There are two exceptions to this rule. (see below) In the event that you require additional pain medication, you are responsible for notifying us, as stated below. 8. Medication Agreement: You are responsible for carefully reading and following our Medication Agreement. This must be signed before receiving any prescriptions from our practice. Safely store a copy of your signed Agreement. Violations  to the Agreement will result in no further prescriptions. (Additional copies of our Medication Agreement are available upon request.) 9. Laws, Rules, & Regulations: All patients are expected to follow all Federal and [employer], [employer], Rules, [employer]. Ignorance of the Laws does not constitute a valid excuse. The use of any illegal substances is prohibited. 10. Adopted CDC guidelines & recommendations: Target dosing levels will be at or below 60 MME/day. Use of benzodiazepines** is not recommended.  Exceptions: There are only two exceptions to the rule of not receiving pain medications from other Healthcare Providers. 1. Exception #1 (Emergencies): In the event of an emergency (i.e.: accident requiring emergency care), you are allowed to receive additional pain medication. However, you are responsible for: As soon as you are able, call our office [phone], at any time of the day or night, and leave a message stating your name, the date and nature of the emergency, and the name and dose of the medication prescribed. In the event that your call is answered by a member of our staff, make sure to document and save the date, time, and the name of the person that took your information.  2. Exception #2 (Planned Surgery): In the event that you are scheduled by another doctor or dentist to have any type of surgery or procedure, you are allowed (for a period no longer than 30 days), to receive additional pain medication, for the acute post-op pain. However, in this case, you are responsible for picking up a copy of our "Post-op Pain Management for Surgeons" handout, and giving it to your surgeon or dentist. This document is available at our office, and does not require an appointment to obtain it. Simply go to our office during business hours (Monday-Thursday from 8:00 AM to 4:00 PM) (Friday 8:00 AM to 12:00 Noon) or if you have a scheduled appointment with [place], prior to your surgery, and ask for it by  name. In addition, you will need to provide [place] with your name, name of your surgeon, type of surgery, and date of procedure or surgery.  *Opioid medications include: morphine, codeine, oxycodone, oxymorphone, hydrocodone, hydromorphone, meperidine, tramadol, tapentadol, buprenorphine, fentanyl, methadone. **Benzodiazepine medications include: diazepam (Valium), alprazolam (Xanax), clonazepam (Klonopine), lorazepam (Ativan), clorazepate (Tranxene), chlordiazepoxide (Librium), estazolam (Prosom), oxazepam (Serax), temazepam (Restoril), triazolam (Halcion)  ____________________________________________________________________________________________

## 2017-02-06 ENCOUNTER — Encounter: Payer: Self-pay | Admitting: Nurse Practitioner

## 2017-02-06 ENCOUNTER — Ambulatory Visit: Payer: Medicare Other | Attending: Nurse Practitioner | Admitting: Nurse Practitioner

## 2017-02-06 VITALS — BP 130/80 | HR 83 | Temp 97.8°F | Resp 18 | Ht 67.0 in | Wt 225.0 lb

## 2017-02-06 DIAGNOSIS — R51 Headache: Secondary | ICD-10-CM | POA: Diagnosis not present

## 2017-02-06 DIAGNOSIS — Z950 Presence of cardiac pacemaker: Secondary | ICD-10-CM | POA: Insufficient documentation

## 2017-02-06 DIAGNOSIS — L309 Dermatitis, unspecified: Secondary | ICD-10-CM | POA: Insufficient documentation

## 2017-02-06 DIAGNOSIS — M5126 Other intervertebral disc displacement, lumbar region: Secondary | ICD-10-CM | POA: Insufficient documentation

## 2017-02-06 DIAGNOSIS — G894 Chronic pain syndrome: Secondary | ICD-10-CM | POA: Insufficient documentation

## 2017-02-06 DIAGNOSIS — G4733 Obstructive sleep apnea (adult) (pediatric): Secondary | ICD-10-CM | POA: Diagnosis not present

## 2017-02-06 DIAGNOSIS — M4722 Other spondylosis with radiculopathy, cervical region: Secondary | ICD-10-CM

## 2017-02-06 DIAGNOSIS — N401 Enlarged prostate with lower urinary tract symptoms: Secondary | ICD-10-CM | POA: Diagnosis not present

## 2017-02-06 DIAGNOSIS — M5127 Other intervertebral disc displacement, lumbosacral region: Secondary | ICD-10-CM | POA: Insufficient documentation

## 2017-02-06 DIAGNOSIS — E785 Hyperlipidemia, unspecified: Secondary | ICD-10-CM | POA: Insufficient documentation

## 2017-02-06 DIAGNOSIS — M25511 Pain in right shoulder: Secondary | ICD-10-CM | POA: Diagnosis not present

## 2017-02-06 DIAGNOSIS — M4692 Unspecified inflammatory spondylopathy, cervical region: Secondary | ICD-10-CM | POA: Diagnosis not present

## 2017-02-06 DIAGNOSIS — N1330 Unspecified hydronephrosis: Secondary | ICD-10-CM | POA: Insufficient documentation

## 2017-02-06 DIAGNOSIS — Z7982 Long term (current) use of aspirin: Secondary | ICD-10-CM | POA: Insufficient documentation

## 2017-02-06 DIAGNOSIS — D696 Thrombocytopenia, unspecified: Secondary | ICD-10-CM | POA: Insufficient documentation

## 2017-02-06 DIAGNOSIS — M170 Bilateral primary osteoarthritis of knee: Secondary | ICD-10-CM | POA: Diagnosis not present

## 2017-02-06 DIAGNOSIS — N433 Hydrocele, unspecified: Secondary | ICD-10-CM | POA: Insufficient documentation

## 2017-02-06 DIAGNOSIS — M5412 Radiculopathy, cervical region: Secondary | ICD-10-CM | POA: Insufficient documentation

## 2017-02-06 DIAGNOSIS — Z87442 Personal history of urinary calculi: Secondary | ICD-10-CM | POA: Insufficient documentation

## 2017-02-06 DIAGNOSIS — Z79891 Long term (current) use of opiate analgesic: Secondary | ICD-10-CM

## 2017-02-06 DIAGNOSIS — R001 Bradycardia, unspecified: Secondary | ICD-10-CM | POA: Insufficient documentation

## 2017-02-06 DIAGNOSIS — F329 Major depressive disorder, single episode, unspecified: Secondary | ICD-10-CM | POA: Insufficient documentation

## 2017-02-06 DIAGNOSIS — G8929 Other chronic pain: Secondary | ICD-10-CM

## 2017-02-06 DIAGNOSIS — R011 Cardiac murmur, unspecified: Secondary | ICD-10-CM | POA: Diagnosis not present

## 2017-02-06 DIAGNOSIS — K5900 Constipation, unspecified: Secondary | ICD-10-CM | POA: Insufficient documentation

## 2017-02-06 DIAGNOSIS — M48061 Spinal stenosis, lumbar region without neurogenic claudication: Secondary | ICD-10-CM | POA: Diagnosis not present

## 2017-02-06 DIAGNOSIS — Z79899 Other long term (current) drug therapy: Secondary | ICD-10-CM | POA: Insufficient documentation

## 2017-02-06 DIAGNOSIS — T402X5A Adverse effect of other opioids, initial encounter: Secondary | ICD-10-CM | POA: Insufficient documentation

## 2017-02-06 DIAGNOSIS — M4802 Spinal stenosis, cervical region: Secondary | ICD-10-CM | POA: Diagnosis not present

## 2017-02-06 DIAGNOSIS — Z880 Allergy status to penicillin: Secondary | ICD-10-CM | POA: Insufficient documentation

## 2017-02-06 DIAGNOSIS — M533 Sacrococcygeal disorders, not elsewhere classified: Secondary | ICD-10-CM | POA: Diagnosis not present

## 2017-02-06 DIAGNOSIS — E669 Obesity, unspecified: Secondary | ICD-10-CM | POA: Diagnosis not present

## 2017-02-06 DIAGNOSIS — F411 Generalized anxiety disorder: Secondary | ICD-10-CM | POA: Insufficient documentation

## 2017-02-06 DIAGNOSIS — M47812 Spondylosis without myelopathy or radiculopathy, cervical region: Secondary | ICD-10-CM

## 2017-02-06 DIAGNOSIS — Z881 Allergy status to other antibiotic agents status: Secondary | ICD-10-CM | POA: Insufficient documentation

## 2017-02-06 DIAGNOSIS — Z888 Allergy status to other drugs, medicaments and biological substances status: Secondary | ICD-10-CM | POA: Insufficient documentation

## 2017-02-06 DIAGNOSIS — Z885 Allergy status to narcotic agent status: Secondary | ICD-10-CM | POA: Insufficient documentation

## 2017-02-06 MED ORDER — OXYCODONE HCL 5 MG PO TABS: 5.0000 mg | ORAL_TABLET | Freq: Four times a day (QID) | ORAL | 0 refills | Status: DC | PRN

## 2017-02-06 MED ORDER — OXYCODONE HCL ER 10 MG PO T12A: 10.0000 mg | EXTENDED_RELEASE_TABLET | Freq: Two times a day (BID) | ORAL | 0 refills | Status: DC

## 2017-02-06 NOTE — Patient Instructions (Signed)

## 2017-02-06 NOTE — Progress Notes (Signed)
Nursing Pain Medication Assessment:  Safety precautions to be maintained throughout the outpatient stay will include: orient to surroundings, keep bed in low position, maintain call bell within reach at all times, provide assistance with transfer out of bed and ambulation.  Medication Inspection Compliance: Pill count conducted under aseptic conditions, in front of the patient. Neither the pills nor the bottle was removed from the patient's sight at any time. Once count was completed pills were immediately returned to the patient in their original bottle.  Medication #1: Oxycodone IR Pill/Patch Count: 39 of 120 pills remain Pill/Patch Appearance: Markings consistent with prescribed medication Bottle Appearance: Standard pharmacy container. Clearly labeled. Filled Date: 06 / 20 / 2018 Last Medication intake:  Yesterday  Medication #2: Oxycodone ER (OxyContin) Pill/Patch Count: 55 of 60 pills remain Pill/Patch Appearance: Markings consistent with prescribed medication Bottle Appearance: Standard pharmacy container. Clearly labeled. Filled Date: 06 / 20 / 2018 Last Medication intake:  Today

## 2017-02-08 ENCOUNTER — Ambulatory Visit: Payer: Medicare Other | Admitting: Nurse Practitioner

## 2017-02-08 ENCOUNTER — Encounter: Payer: Self-pay | Admitting: Family Medicine

## 2017-02-08 ENCOUNTER — Ambulatory Visit (INDEPENDENT_AMBULATORY_CARE_PROVIDER_SITE_OTHER): Payer: Medicare Other | Admitting: Family Medicine

## 2017-02-08 VITALS — BP 136/70 | HR 81 | Temp 98.6°F | Wt 228.5 lb

## 2017-02-08 DIAGNOSIS — F411 Generalized anxiety disorder: Secondary | ICD-10-CM

## 2017-02-08 DIAGNOSIS — R5382 Chronic fatigue, unspecified: Secondary | ICD-10-CM | POA: Insufficient documentation

## 2017-02-08 DIAGNOSIS — G4700 Insomnia, unspecified: Secondary | ICD-10-CM

## 2017-02-08 DIAGNOSIS — G4733 Obstructive sleep apnea (adult) (pediatric): Secondary | ICD-10-CM

## 2017-02-08 DIAGNOSIS — F331 Major depressive disorder, recurrent, moderate: Secondary | ICD-10-CM | POA: Diagnosis not present

## 2017-02-08 NOTE — Assessment & Plan Note (Signed)
Overall stable period - discussed trial taper of prozac and continued klonopin taper.

## 2017-02-08 NOTE — Patient Instructions (Addendum)
We will refer you to sleep doctors.  Try continuing to decrease/taper klonopin and prozac. Let me know how you do with this.  Return one morning next week for labwork.  Return as needed or in 6 months for medicare wellness visit with Virl AxeLesia and physical with me.

## 2017-02-08 NOTE — Assessment & Plan Note (Signed)
On klonopin for trouble with sleep maintenance insomnia. Tried and failed Indiaambien, lunesta

## 2017-02-08 NOTE — Progress Notes (Signed)
BP 136/70 (BP Location: Left Arm, Patient Position: Sitting, Cuff Size: Large)   Pulse 81   Temp 98.6 F (37 C) (Oral)   Wt 228 lb 8 oz (103.6 kg)   SpO2 97%   BMI 35.79 kg/m    CC: 6 mo f/u visit Subjective:    Patient ID: Mark [name], male    DOB: Feb 04, 1956, 61 y.o.   MRN: [id number]  HPI: Mark [name] is a 61 y.o. male presenting on 02/08/2017 for Follow-up (6 month f/u); Fatigue; and Not sleeping well   GAD on prozac 60mg  daily and klonopin 1-3 tab daily. Last received #270 klonopin Rx 05/2016. He has cut klonopin down to one tablet nightly. May be interested in further taper.  Last UDS by pain management 05/2016 appropriate.   Increased fatigue noted over last several years, progressively worsening. Describes both decreased energy and sleepiness. Decreased stamina and deconditioning. Decreased activity as well. Poor sleep at night time - daytime somnolence endorsed. Easily takes 3 hr nap during the day. + snoring. Trouble finding well fitting mask - so he never received CPAP treatment.   Asks about prime male testosterone booster.   He divorced wife 5 yrs ago.  Decreased sex drive. Not currently dating.   Relevant past medical, surgical, family and social history reviewed and updated as indicated. Interim medical history since our last visit reviewed. Allergies and medications reviewed and updated. Outpatient Medications Prior to Visit  Medication Sig Dispense Refill  . aspirin 81 MG tablet Take 81 mg by mouth daily.      Marland Kitchen atorvastatin (LIPITOR) 20 MG tablet Take 1 tablet (20 mg total) by mouth daily. 90 tablet 3  . cetirizine (ZYRTEC) 10 MG tablet Take 10 mg by mouth daily. As needed    . Cholecalciferol (VITAMIN D) 2000 units CAPS Take 1 capsule by mouth daily.    . clindamycin (CLEOCIN) 150 MG capsule Take 150 mg by mouth as needed. 4 caps 1 hour before dental procedures.     . clonazePAM (KLONOPIN) 1 MG tablet TAKE 1 BY MOUTH 3 TIMES DAILY AS NEEDED FOR ANXIETY 270  tablet 0  . CRANBERRY PO Take 4,200 mg by mouth daily.     Marland Kitchen desonide (DESOWEN) 0.05 % ointment Apply topically 2 (two) times daily. (Patient taking differently: Apply topically as needed. ) 60 g 1  . FLUoxetine (PROZAC) 20 MG capsule TAKE 1 BY MOUTH DAILY 90 capsule 1  . FLUoxetine (PROZAC) 40 MG capsule TAKE 1 BY MOUTH DAILY 90 capsule 1  . fluticasone (FLONASE) 50 MCG/ACT nasal spray Place 2 sprays into both nostrils daily. 48 g 1  . furosemide (LASIX) 20 MG tablet TAKE 1 BY MOUTH DAILY 90 tablet 3  . Garlic 1000 MG CAPS Take 1 capsule by mouth daily.      . Multiple Vitamin (MULTIVITAMIN PO) Take 1 tablet by mouth daily.      . Naloxone HCl (NARCAN) 4 MG/0.1ML LIQD Spray into one nostril. Repeat with second device into other nostril after 2-3 minutes if no or minimal response. 2 each 0  . naproxen (NAPROSYN) 500 MG tablet TAKE 1 BY MOUTH TWICE DAILY WITH A MEAL 180 tablet 1  . Omega-3 Fatty Acids (FISH OIL) 1200 MG CAPS Take 1,200 mg by mouth daily.     Marland Kitchen omeprazole (PRILOSEC) 20 MG capsule TAKE 1 BY MOUTH DAILY 90 capsule 3  . [START ON 02/23/2017] oxyCODONE (OXY IR/ROXICODONE) 5 MG immediate release tablet Take 1 tablet (5  mg total) by mouth every 6 (six) hours as needed for severe pain. 120 tablet 0  . [START ON 03/25/2017] oxyCODONE (OXY IR/ROXICODONE) 5 MG immediate release tablet Take 1 tablet (5 mg total) by mouth every 6 (six) hours as needed for severe pain. 120 tablet 0  . [START ON 04/24/2017] oxyCODONE (OXY IR/ROXICODONE) 5 MG immediate release tablet Take 1 tablet (5 mg total) by mouth every 6 (six) hours as needed for severe pain. 120 tablet 0  . [START ON 03/25/2017] oxyCODONE (OXYCONTIN) 10 mg 12 hr tablet Take 1 tablet (10 mg total) by mouth every 12 (twelve) hours. 60 tablet 0  . [START ON 04/24/2017] oxyCODONE (OXYCONTIN) 10 mg 12 hr tablet Take 1 tablet (10 mg total) by mouth every 12 (twelve) hours. 60 tablet 0  . [START ON 02/23/2017] oxyCODONE (OXYCONTIN) 10 mg 12 hr tablet  Take 1 tablet (10 mg total) by mouth every 12 (twelve) hours. 60 tablet 0  . polyethylene glycol powder (MIRALAX) powder Take 1 Container by mouth as needed.     . potassium citrate (UROCIT-K) 10 MEQ (1080 MG) SR tablet Take 10 mEq by mouth 2 (two) times daily.    . Saw Palmetto, Serenoa repens, (SAW PALMETTO PO) Take 450 mg by mouth daily.     . sildenafil (REVATIO) 20 MG tablet 3-5 tablets daily as needed    . VITAMIN B COMPLEX-C CAPS Take by mouth daily.     . vitamin E 1000 UNIT capsule      No facility-administered medications prior to visit.      Per HPI unless specifically indicated in ROS section below Review of Systems     Objective:    BP 136/70 (BP Location: Left Arm, Patient Position: Sitting, Cuff Size: Large)   Pulse 81   Temp 98.6 F (37 C) (Oral)   Wt 228 lb 8 oz (103.6 kg)   SpO2 97%   BMI 35.79 kg/m   Wt Readings from Last 3 Encounters:  02/08/17 228 lb 8 oz (103.6 kg)  02/06/17 225 lb (102.1 kg)  10/26/16 225 lb (102.1 kg)    Physical Exam  Constitutional: He appears well-developed and well-nourished. No distress.  HENT:  Mouth/Throat: Oropharynx is clear and moist. No oropharyngeal exudate.  Eyes: Conjunctivae and EOM are normal. Pupils are equal, round, and reactive to light. No scleral icterus.  Neck: Normal range of motion. Neck supple. No thyromegaly present.  Cardiovascular: Normal rate, regular rhythm, normal heart sounds and intact distal pulses.   No murmur heard. Pulmonary/Chest: Effort normal and breath sounds normal. No respiratory distress. He has no wheezes.  Musculoskeletal: He exhibits edema (tr).  Skin: Skin is warm and dry. No rash noted.  Psychiatric: He has a normal mood and affect.  Nursing note and vitals reviewed.  Results for orders placed or performed in visit on 08/04/16  CBC with Differential/Platelet  Result Value Ref Range   WBC 9.1 4.0 - 10.5 K/uL   RBC 4.76 4.22 - 5.81 Mil/uL   Hemoglobin 14.9 13.0 - 17.0 g/dL   HCT  16.1 09.6 - 04.5 %   MCV 88.3 78.0 - 100.0 fl   MCHC 35.4 30.0 - 36.0 g/dL   RDW 40.9 81.1 - 91.4 %   Platelets 164.0 150.0 - 400.0 K/uL   Neutrophils Relative % 61.1 43.0 - 77.0 %   Lymphocytes Relative 24.0 12.0 - 46.0 %   Monocytes Relative 11.8 3.0 - 12.0 %   Eosinophils Relative 2.4 0.0 - 5.0 %  Basophils Relative 0.7 0.0 - 3.0 %   Neutro Abs 5.6 1.4 - 7.7 K/uL   Lymphs Abs 2.2 0.7 - 4.0 K/uL   Monocytes Absolute 1.1 (H) 0.1 - 1.0 K/uL   Eosinophils Absolute 0.2 0.0 - 0.7 K/uL   Basophils Absolute 0.1 0.0 - 0.1 K/uL  Lipid panel  Result Value Ref Range   Cholesterol 104 0 - 200 mg/dL   Triglycerides 161.0140.0 0.0 - 149.0 mg/dL   HDL 96.0434.90 (L) >54.09>39.00 mg/dL   VLDL 81.128.0 0.0 - 91.440.0 mg/dL   LDL Cholesterol 41 0 - 99 mg/dL   Total CHOL/HDL Ratio 3    NonHDL 69.10   Basic metabolic panel  Result Value Ref Range   Sodium 140 135 - 145 mEq/L   Potassium 4.0 3.5 - 5.1 mEq/L   Chloride 102 96 - 112 mEq/L   CO2 31 19 - 32 mEq/L   Glucose, Bld 120 (H) 70 - 99 mg/dL   BUN 18 6 - 23 mg/dL   Creatinine, Ser 7.821.08 0.40 - 1.50 mg/dL   Calcium 9.1 8.4 - 95.610.5 mg/dL   GFR 21.3074.23 >86.57>60.00 mL/min  PSA  Result Value Ref Range   PSA 1.10 0.10 - 4.00 ng/mL      Assessment & Plan:   Problem List Items Addressed This Visit    Chronic fatigue - Primary    Longstanding issue. Anticipate multifactorial with OSA and chronic narcotic use contributing. Will have him return next week for am labs to eval for reversible causes of fatigue (including testosterone). Encouraged regular activity.      Relevant Orders   Comprehensive metabolic panel   TSH   Testosterone   GAD (generalized anxiety disorder)    Stable period. Will try to taper prozac and klonopin.       Insomnia    On klonopin for trouble with sleep maintenance insomnia. Tried and failed [place]ambien, lunesta      MDD (major depressive disorder), recurrent episode, moderate (HCC)    Overall stable period - discussed trial taper of prozac and  continued klonopin taper.       Obstructive sleep apnea    Pt agrees to return to pulm for re evaluation.  Trouble finding well fitting mask so he never used CPAP consistently.       Relevant Orders   Ambulatory referral to Pulmonology       Follow up plan: Return if symptoms worsen or fail to improve, for annual exam, prior fasting for blood work, medicare wellness visit.  Eustaquio BoydenJavier Samantha Ragen, MD

## 2017-02-08 NOTE — Assessment & Plan Note (Addendum)
Pt agrees to return to pulm for re evaluation.  Trouble finding well fitting mask so he never used CPAP consistently.

## 2017-02-08 NOTE — Assessment & Plan Note (Signed)
Stable period. Will try to taper prozac and klonopin.

## 2017-02-08 NOTE — Assessment & Plan Note (Signed)
Longstanding issue. Anticipate multifactorial with OSA and chronic narcotic use contributing. Will have him return next week for am labs to eval for reversible causes of fatigue (including testosterone). Encouraged regular activity.

## 2017-02-12 LAB — TOXASSURE SELECT 13 (MW), URINE

## 2017-02-13 ENCOUNTER — Other Ambulatory Visit (INDEPENDENT_AMBULATORY_CARE_PROVIDER_SITE_OTHER): Payer: Medicare Other

## 2017-02-13 DIAGNOSIS — R5382 Chronic fatigue, unspecified: Secondary | ICD-10-CM | POA: Diagnosis not present

## 2017-02-13 DIAGNOSIS — I495 Sick sinus syndrome: Secondary | ICD-10-CM | POA: Diagnosis not present

## 2017-02-13 LAB — COMPREHENSIVE METABOLIC PANEL
ALT: 25 U/L (ref 0–53)
AST: 24 U/L (ref 0–37)
Albumin: 3.9 g/dL (ref 3.5–5.2)
Alkaline Phosphatase: 53 U/L (ref 39–117)
BUN: 19 mg/dL (ref 6–23)
CO2: 25 mEq/L (ref 19–32)
Calcium: 9.2 mg/dL (ref 8.4–10.5)
Chloride: 104 mEq/L (ref 96–112)
Creatinine, Ser: 1.1 mg/dL (ref 0.40–1.50)
GFR: 72.55 mL/min (ref >60.00)
Glucose, Bld: 159 mg/dL — ABNORMAL HIGH (ref 70–99)
Potassium: 3.7 mEq/L (ref 3.5–5.1)
Sodium: 140 mEq/L (ref 135–145)
Total Bilirubin: 0.7 mg/dL (ref 0.2–1.2)
Total Protein: 6.4 g/dL (ref 6.0–8.3)

## 2017-02-13 LAB — TESTOSTERONE: Testosterone: 216.29 ng/dL — ABNORMAL LOW (ref 300.00–890.00)

## 2017-02-13 LAB — TSH: TSH: 3.99 u[IU]/mL (ref 0.35–4.50)

## 2017-02-18 ENCOUNTER — Encounter: Payer: Self-pay | Admitting: Family Medicine

## 2017-02-18 DIAGNOSIS — R7989 Other specified abnormal findings of blood chemistry: Secondary | ICD-10-CM | POA: Insufficient documentation

## 2017-02-19 DIAGNOSIS — I10 Essential (primary) hypertension: Secondary | ICD-10-CM | POA: Diagnosis not present

## 2017-02-19 DIAGNOSIS — Z950 Presence of cardiac pacemaker: Secondary | ICD-10-CM | POA: Diagnosis not present

## 2017-02-19 DIAGNOSIS — R001 Bradycardia, unspecified: Secondary | ICD-10-CM | POA: Diagnosis not present

## 2017-03-08 NOTE — Progress Notes (Deleted)
[facility]      Assessment and Plan:  Sleep apnea.   Excessive daytime sleepiness.   Date: 03/08/2017  MRN# [id number] Mark [name] 10/28/1955  Referring Physician:   Gwendel HansonDennis K [name] is a 61 y.o. old male seen in consultation for chief complaint of:   No chief complaint on file.   HPI:  The patient is a 61 yo male referred with progressive fatigue and sleep at night time with excessive daytimes sleepiness.  Easily takes 3 hr nap during the day. + snoring. He was found to have OSA in the past but  Had trouble finding well fitting mask - so he never received CPAP treatment.     PMHX:   Past Medical History:  Diagnosis Date  . Anxiety   . Chronic pain   . Depression   . History of kidney stones April 12,2017   removed two kidney stones about 1 cm in size  . History of permanent cardiac pacemaker placement 05/31/2015  . Kidney stones   . Migraine   . Prostatitis   . Sleep apnea    Surgical Hx:  Past Surgical History:  Procedure Laterality Date  . CARDIOVASCULAR STRESS TEST  2014   Dr Mark [name]  . CARPAL TUNNEL RELEASE Bilateral 2006  . CERVICAL SPINE SURGERY  2008   C5/6  . COLONOSCOPY  07/2007   prostate nodule, hemorrhoid, rpt 5 yrs Mark [name](Mark [name])  . LITHOTRIPSY Bilateral 2012   Mark [name]  . LITHOTRIPSY Left 11/2015   x2 Mark [name]  . PACEMAKER PLACEMENT  2009   s/p Medtronic Adapta DR Pacemaker (Dr. Lady [name])  . ROTATOR CUFF REPAIR Left 2009  . TONSILLECTOMY  1964  . ULNAR NERVE REPAIR  2008   Left   Family Hx:  Family History  Problem Relation Age of Onset  . Heart disease Mother   . Diabetes Mother   . Arthritis Mother   . Cancer Mother        Breast Cancer  . Heart block Father   . Heart disease Father   . Arthritis Father   . Cancer Father        skin cancer   Social Hx:   Social History  Substance Use Topics  . Smoking status: Never Smoker  . Smokeless tobacco: Never Used  . Alcohol use No   Medication:    Current  Outpatient Prescriptions:  .  aspirin 81 MG tablet, Take 81 mg by mouth daily.  , Disp: , Rfl:  .  atorvastatin (LIPITOR) 20 MG tablet, Take 1 tablet (20 mg total) by mouth daily., Disp: 90 tablet, Rfl: 3 .  cetirizine (ZYRTEC) 10 MG tablet, Take 10 mg by mouth daily. As needed, Disp: , Rfl:  .  Cholecalciferol (VITAMIN D) 2000 units CAPS, Take 1 capsule by mouth daily., Disp: , Rfl:  .  clindamycin (CLEOCIN) 150 MG capsule, Take 150 mg by mouth as needed. 4 caps 1 hour before dental procedures. , Disp: , Rfl:  .  clonazePAM (KLONOPIN) 1 MG tablet, TAKE 1 BY MOUTH 3 TIMES DAILY AS NEEDED FOR ANXIETY, Disp: 270 tablet, Rfl: 0 .  CRANBERRY PO, Take 4,200 mg by mouth daily. , Disp: , Rfl:  .  desonide (DESOWEN) 0.05 % ointment, Apply topically 2 (two) times daily. (Patient taking differently: Apply topically as needed. ), Disp: 60 g, Rfl: 1 .  FLUoxetine (PROZAC) 20 MG capsule, TAKE 1 BY MOUTH DAILY, Disp: 90 capsule, Rfl: 1 .  FLUoxetine (PROZAC) 40 MG  capsule, TAKE 1 BY MOUTH DAILY, Disp: 90 capsule, Rfl: 1 .  fluticasone (FLONASE) 50 MCG/ACT nasal spray, Place 2 sprays into both nostrils daily., Disp: 48 g, Rfl: 1 .  furosemide (LASIX) 20 MG tablet, TAKE 1 BY MOUTH DAILY, Disp: 90 tablet, Rfl: 3 .  Garlic 1000 MG CAPS, Take 1 capsule by mouth daily.  , Disp: , Rfl:  .  Multiple Vitamin (MULTIVITAMIN PO), Take 1 tablet by mouth daily.  , Disp: , Rfl:  .  Naloxone HCl (NARCAN) 4 MG/0.1ML LIQD, Spray into one nostril. Repeat with second device into other nostril after 2-3 minutes if no or minimal response., Disp: 2 each, Rfl: 0 .  naproxen (NAPROSYN) 500 MG tablet, TAKE 1 BY MOUTH TWICE DAILY WITH A MEAL, Disp: 180 tablet, Rfl: 1 .  Omega-3 Fatty Acids (FISH OIL) 1200 MG CAPS, Take 1,200 mg by mouth daily. , Disp: , Rfl:  .  omeprazole (PRILOSEC) 20 MG capsule, TAKE 1 BY MOUTH DAILY, Disp: 90 capsule, Rfl: 3 .  oxyCODONE (OXY IR/ROXICODONE) 5 MG immediate release tablet, Take 1 tablet (5 mg total) by  mouth every 6 (six) hours as needed for severe pain., Disp: 120 tablet, Rfl: 0 .  [START ON 03/25/2017] oxyCODONE (OXY IR/ROXICODONE) 5 MG immediate release tablet, Take 1 tablet (5 mg total) by mouth every 6 (six) hours as needed for severe pain., Disp: 120 tablet, Rfl: 0 .  [START ON 04/24/2017] oxyCODONE (OXY IR/ROXICODONE) 5 MG immediate release tablet, Take 1 tablet (5 mg total) by mouth every 6 (six) hours as needed for severe pain., Disp: 120 tablet, Rfl: 0 .  [START ON 03/25/2017] oxyCODONE (OXYCONTIN) 10 mg 12 hr tablet, Take 1 tablet (10 mg total) by mouth every 12 (twelve) hours., Disp: 60 tablet, Rfl: 0 .  [START ON 04/24/2017] oxyCODONE (OXYCONTIN) 10 mg 12 hr tablet, Take 1 tablet (10 mg total) by mouth every 12 (twelve) hours., Disp: 60 tablet, Rfl: 0 .  oxyCODONE (OXYCONTIN) 10 mg 12 hr tablet, Take 1 tablet (10 mg total) by mouth every 12 (twelve) hours., Disp: 60 tablet, Rfl: 0 .  polyethylene glycol powder (MIRALAX) powder, Take 1 Container by mouth as needed. , Disp: , Rfl:  .  potassium citrate (UROCIT-K) 10 MEQ (1080 MG) SR tablet, Take 10 mEq by mouth 2 (two) times daily., Disp: , Rfl:  .  Saw Palmetto, Serenoa repens, (SAW PALMETTO PO), Take 450 mg by mouth daily. , Disp: , Rfl:  .  sildenafil (REVATIO) 20 MG tablet, 3-5 tablets daily as needed, Disp: , Rfl:  .  VITAMIN B COMPLEX-C CAPS, Take by mouth daily. , Disp: , Rfl:  .  vitamin E 1000 UNIT capsule, , Disp: , Rfl:    Allergies:  Ciprofloxacin; Metformin; Penicillin v potassium; Penicillins; Buprenorphine hcl; and Morphine and related  Review of Systems: Gen:  Denies  fever, sweats, chills HEENT: Denies blurred vision, double vision. bleeds, sore throat Cvc:  No dizziness, chest pain. Resp:   Denies cough or sputum production, shortness of breath Gi: Denies swallowing difficulty, stomach pain. Gu:  Denies bladder incontinence, burning urine Ext:   No Joint pain, stiffness. Skin: No skin rash,  hives  Endoc:  No  polyuria, polydipsia. Psych: No depression, insomnia. Other:  All other systems were reviewed with the patient and were negative other that what is mentioned in the HPI.   Physical Examination:   VS: There were no vitals taken for this visit.  General Appearance: No distress  Neuro:without focal findings,  speech normal,  HEENT: PERRLA, EOM intact.   Pulmonary: normal breath sounds, No wheezing.  CardiovascularNormal S1,S2.  No m/r/g.   Abdomen: Benign, Soft, non-tender. Renal:  No costovertebral tenderness  GU:  No performed at this time. Endoc: No evident thyromegaly, no signs of acromegaly. Skin:   warm, no rashes, no ecchymosis  Extremities: normal, no cyanosis, clubbing.  Other findings:    LABORATORY PANEL:   CBC No results for input(s): WBC, HGB, HCT, PLT in the last 168 hours. ------------------------------------------------------------------------------------------------------------------  Chemistries  No results for input(s): NA, K, CL, CO2, GLUCOSE, BUN, CREATININE, CALCIUM, MG, AST, ALT, ALKPHOS, BILITOT in the last 168 hours.  Invalid input(s): GFRCGP ------------------------------------------------------------------------------------------------------------------  Cardiac Enzymes No results for input(s): TROPONINI in the last 168 hours. ------------------------------------------------------------  RADIOLOGY:  No results found.     Thank  you for the consultation and for allowing [facility] [place] [facility] to assist in the care of your patient. Our recommendations are noted above.  Please contact us if we can be of further service.   Wells Guiles, MD.  Board Certified in Internal Medicine, Pulmonary Medicine, Critical Care Medicine, and Sleep Medicine.  [place] [facility] Office Number: [phone]  Santiago Glad, M.D.  Billy Fischer, M.D  03/08/2017

## 2017-03-12 ENCOUNTER — Institutional Professional Consult (permissible substitution): Payer: Medicare Other | Admitting: Internal Medicine

## 2017-04-23 ENCOUNTER — Institutional Professional Consult (permissible substitution): Payer: Medicare Other | Admitting: Internal Medicine

## 2017-04-26 ENCOUNTER — Other Ambulatory Visit: Payer: Self-pay

## 2017-04-26 MED ORDER — FUROSEMIDE 20 MG PO TABS: ORAL_TABLET | ORAL | 1 refills | Status: DC

## 2017-04-26 MED ORDER — FLUOXETINE HCL 40 MG PO CAPS: ORAL_CAPSULE | ORAL | 1 refills | Status: DC

## 2017-04-26 MED ORDER — NAPROXEN 500 MG PO TABS: ORAL_TABLET | ORAL | 1 refills | Status: DC

## 2017-05-09 ENCOUNTER — Ambulatory Visit: Payer: Medicare Other | Attending: Nurse Practitioner | Admitting: Nurse Practitioner

## 2017-05-09 ENCOUNTER — Encounter: Payer: Self-pay | Admitting: Nurse Practitioner

## 2017-05-09 VITALS — BP 130/76 | HR 80 | Temp 97.8°F | Resp 16 | Ht 67.0 in | Wt 228.0 lb

## 2017-05-09 DIAGNOSIS — Z7982 Long term (current) use of aspirin: Secondary | ICD-10-CM | POA: Insufficient documentation

## 2017-05-09 DIAGNOSIS — G8929 Other chronic pain: Secondary | ICD-10-CM | POA: Diagnosis not present

## 2017-05-09 DIAGNOSIS — E669 Obesity, unspecified: Secondary | ICD-10-CM | POA: Insufficient documentation

## 2017-05-09 DIAGNOSIS — N400 Enlarged prostate without lower urinary tract symptoms: Secondary | ICD-10-CM | POA: Insufficient documentation

## 2017-05-09 DIAGNOSIS — N433 Hydrocele, unspecified: Secondary | ICD-10-CM | POA: Diagnosis not present

## 2017-05-09 DIAGNOSIS — M25512 Pain in left shoulder: Secondary | ICD-10-CM

## 2017-05-09 DIAGNOSIS — F329 Major depressive disorder, single episode, unspecified: Secondary | ICD-10-CM | POA: Insufficient documentation

## 2017-05-09 DIAGNOSIS — M5126 Other intervertebral disc displacement, lumbar region: Secondary | ICD-10-CM | POA: Insufficient documentation

## 2017-05-09 DIAGNOSIS — F429 Obsessive-compulsive disorder, unspecified: Secondary | ICD-10-CM | POA: Insufficient documentation

## 2017-05-09 DIAGNOSIS — F411 Generalized anxiety disorder: Secondary | ICD-10-CM | POA: Insufficient documentation

## 2017-05-09 DIAGNOSIS — G4700 Insomnia, unspecified: Secondary | ICD-10-CM | POA: Diagnosis not present

## 2017-05-09 DIAGNOSIS — Z950 Presence of cardiac pacemaker: Secondary | ICD-10-CM | POA: Diagnosis not present

## 2017-05-09 DIAGNOSIS — D696 Thrombocytopenia, unspecified: Secondary | ICD-10-CM | POA: Insufficient documentation

## 2017-05-09 DIAGNOSIS — G894 Chronic pain syndrome: Secondary | ICD-10-CM

## 2017-05-09 DIAGNOSIS — M4722 Other spondylosis with radiculopathy, cervical region: Secondary | ICD-10-CM | POA: Diagnosis not present

## 2017-05-09 DIAGNOSIS — M25511 Pain in right shoulder: Secondary | ICD-10-CM

## 2017-05-09 DIAGNOSIS — G4733 Obstructive sleep apnea (adult) (pediatric): Secondary | ICD-10-CM | POA: Diagnosis not present

## 2017-05-09 DIAGNOSIS — M25561 Pain in right knee: Secondary | ICD-10-CM | POA: Insufficient documentation

## 2017-05-09 DIAGNOSIS — R011 Cardiac murmur, unspecified: Secondary | ICD-10-CM | POA: Diagnosis not present

## 2017-05-09 DIAGNOSIS — M4802 Spinal stenosis, cervical region: Secondary | ICD-10-CM | POA: Diagnosis not present

## 2017-05-09 DIAGNOSIS — M542 Cervicalgia: Secondary | ICD-10-CM

## 2017-05-09 DIAGNOSIS — M170 Bilateral primary osteoarthritis of knee: Secondary | ICD-10-CM | POA: Insufficient documentation

## 2017-05-09 DIAGNOSIS — Z981 Arthrodesis status: Secondary | ICD-10-CM | POA: Diagnosis not present

## 2017-05-09 DIAGNOSIS — Z87442 Personal history of urinary calculi: Secondary | ICD-10-CM | POA: Insufficient documentation

## 2017-05-09 DIAGNOSIS — M533 Sacrococcygeal disorders, not elsewhere classified: Secondary | ICD-10-CM | POA: Insufficient documentation

## 2017-05-09 DIAGNOSIS — M19012 Primary osteoarthritis, left shoulder: Secondary | ICD-10-CM | POA: Diagnosis not present

## 2017-05-09 DIAGNOSIS — Z6839 Body mass index (BMI) 39.0-39.9, adult: Secondary | ICD-10-CM | POA: Insufficient documentation

## 2017-05-09 DIAGNOSIS — M5412 Radiculopathy, cervical region: Secondary | ICD-10-CM | POA: Diagnosis not present

## 2017-05-09 DIAGNOSIS — E785 Hyperlipidemia, unspecified: Secondary | ICD-10-CM | POA: Diagnosis not present

## 2017-05-09 DIAGNOSIS — F209 Schizophrenia, unspecified: Secondary | ICD-10-CM | POA: Insufficient documentation

## 2017-05-09 MED ORDER — OXYCODONE HCL 5 MG PO TABS: 5.0000 mg | ORAL_TABLET | Freq: Four times a day (QID) | ORAL | 0 refills | Status: DC | PRN

## 2017-05-09 MED ORDER — OXYCODONE HCL ER 10 MG PO T12A: 10.0000 mg | EXTENDED_RELEASE_TABLET | Freq: Two times a day (BID) | ORAL | 0 refills | Status: DC

## 2017-05-09 NOTE — Progress Notes (Signed)
Patient's Name: Mark [name]  MRN: [id number]  Referring Provider: Ria Bush, MD  DOB: [birth year]  PCP: Ria Bush, MD  DOS: 05/09/2017  Note by: Vevelyn Francois NP  Service setting: Ambulatory outpatient  Specialty: Interventional Pain Management  Location: [facility] (AMB) Pain Management Facility    Patient type: Established    Primary Reason(s) for Visit: Encounter for prescription drug management. (Level of risk: moderate)  CC: Neck Pain (bilateral s/p disc replacement january 2008.  pain never changed) and Shoulder Pain (left)  HPI  Mr. Tsosie is a 61 y.o. year old, male patient, who comes today for a medication management evaluation. He has Chronic pain syndrome; MDD (major depressive disorder), recurrent episode, moderate ([place]); Insomnia; GAD (generalized anxiety disorder); Medicare annual wellness visit, subsequent; Hyperlipidemia; Thrombocytopenia ([place]); Obesity, Class II, BMI 35-39.9, with comorbidity ([place]); Health maintenance examination; Hydrocele in adult; Candidal dermatitis; Benign prostatic hyperplasia with urinary obstruction; Nephrolithiasis; Bradycardia; Cardiac murmur; Opiate use (60 MME/Day); Encounter for therapeutic drug level monitoring; Chronic neck pain (Location of Primary Source of Pain) (Bilateral) (L>R); Chronic shoulder pain (Bilateral) (L>R); Chronic knee pain (Bilateral) (R>L); History of permanent cardiac pacemaker placement; Opioid-induced constipation (OIC); Radicular pain of shoulder (Bilateral) (L>R); 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); Cervicogenic headache; Cervical spondylosis with radiculopathy (Bilateral) (L>R); Cervical facet syndrome (Bilateral) (L>R); Cervical foraminal stenosis (Severe C6-7) (Left); Chronic sacroiliac joint pain (Bilateral); Osteoarthritis of sacroiliac joint (Bilateral); Lumbar facet hypertrophy (L1-2, L2-3, and L4-5) (Bilateral); Lumbar IVDD (intervertebral disc displacement); Lumbar lateral recess stenosis  (L4-5) (Left); Lumbar facet syndrome (Bilateral); Osteoarthritis of shoulder (Left); Osteoarthritis of knee (Bilateral) (R>L); Obstructive sleep apnea; Hypocitraturia; Long term current use of opiate analgesic; Chronic cervical radicular pain; Advanced care planning/counseling discussion; Chronic fatigue; and Low testosterone in male on his problem list. His primarily concern today is the Neck Pain (bilateral s/p disc replacement january 2008.  pain never changed) and Shoulder Pain (left)  Pain Assessment: Location: Left, Right (left shoulder) Neck (shoulder) Radiating: shoulder pain goes down left arm occassionally all the way down into fingers  Onset: More than a month ago Duration: Chronic pain Quality: Aching, Other (Comment), Constant (weakness) Severity: 2 /10 (self-reported pain score)  Note: Reported level is compatible with observation.                    Effect on ADL: increased activity level increases the amount of pain that goes down the left arm.  Timing: Constant Modifying factors: rest or doing "nothing"  Mr. Travelstead was last scheduled for an appointment on 02/06/2017 for medication management. During today's appointment we reviewed Mr. Hertenstein's chronic pain status, as well as his outpatient medication regimen. He admits that he pain maybe getting worse. He admits that he has good days and bad days. He has numbness and tingling. He admits that the surgery was not effective. He does not feel like the interventional therapies are effective.  The patient  reports that he does not use drugs. His body mass index is 35.71 kg/m.  Further details on both, my assessment(s), as well as the proposed treatment plan, please see below.  Controlled Substance Pharmacotherapy Assessment REMS (Risk Evaluation and Mitigation Strategy)  Analgesic:Oxycodone ER 10 mg every 12 hours (20 mg/day) + oxycodone IR 5 mg every 6 hours (20 mg/day) MME/day:60 mg/day.  Janett Billow, RN  05/09/2017 11:58  AM  Sign at close encounter Nursing Pain Medication Assessment:  Safety precautions to be maintained throughout the outpatient stay will include:  orient to surroundings, keep bed in low position, maintain call bell within reach at all times, provide assistance with transfer out of bed and ambulation.  Medication Inspection Compliance: Pill count conducted under aseptic conditions, in front of the patient. Neither the pills nor the bottle was removed from the patient's sight at any time. Once count was completed pills were immediately returned to the patient in their original bottle.  Medication #1: Oxycodone IR Pill/Patch Count: 35 of 120 pills remain Pill/Patch Appearance: Markings consistent with prescribed medication Bottle Appearance: Standard pharmacy container. Clearly labeled. Filled Date: 09 / 20 / 2018 Last Medication intake:  Yesterday  Medication #2: Oxycodone ER (OxyContin) Pill/Patch Count: 51 of 60 pills remain Pill/Patch Appearance: Markings consistent with prescribed medication Bottle Appearance: Standard pharmacy container. Clearly labeled. Filled Date: 09 / 20 / 2018 Last Medication intake:  Today   Pharmacokinetics: Liberation and absorption (onset of action): WNL Distribution (time to peak effect): WNL Metabolism and excretion (duration of action): WNL         Pharmacodynamics: Desired effects: Analgesia: Mr. Tatsch reports >50% benefit. Functional ability: Patient reports that medication allows him to accomplish basic ADLs Clinically meaningful improvement in function (CMIF): Sustained CMIF goals met Perceived effectiveness: Described as relatively effective, allowing for increase in activities of daily living (ADL) Undesirable effects: Side-effects or Adverse reactions: None reported Monitoring: [place] PMP: Online review of the past 70-monthperiod conducted. Compliant with practice rules and regulations Last UDS on record: Summary  Date Value Ref Range Status   02/06/2017 FINAL  Final    Comment:    ==================================================================== TOXASSURE SELECT 13 (MW) ==================================================================== Test                             Result       Flag       Units Drug Present and Declared for Prescription Verification   7-aminoclonazepam              317          EXPECTED   ng/mg creat    7-aminoclonazepam is an expected metabolite of clonazepam. Source    of clonazepam is a scheduled prescription medication.   Oxycodone                      1279         EXPECTED   ng/mg creat   Noroxycodone                   2043         EXPECTED   ng/mg creat    Sources of oxycodone include scheduled prescription medications.    Noroxycodone is an expected metabolite of oxycodone. ==================================================================== Test                      Result    Flag   Units      Ref Range   Creatinine              47               mg/dL      >=20 ==================================================================== Declared Medications:  The flagging and interpretation on this report are based on the  following declared medications.  Unexpected results may arise from  inaccuracies in the declared medications.  **Note: The testing scope of this panel includes these medications:  Clonazepam (Klonopin)  Oxycodone (OxyContin)  Oxycodone (Roxicodone)  **Note: The testing scope of this panel does not include following  reported medications:  Aspirin (Aspirin 81)  Atorvastatin (Lipitor)  Cetirizine (Zyrtec)  Cholecalciferol  Clindamycin  Fluoxetine  Fluticasone  Furosemide (Lasix)  Multivitamin  Naloxone (Narcan)  Naproxen (Naprosyn)  Omega-3 Fatty Acids (Fish Oil)  Omeprazole (Prilosec)  Polyethylene Glycol (GlycoLAX)  Potassium Citrate (Urocit-K)  Sildenafil (Revatio)  Supplement  Supplement (Saw Palmetto)  Topical  Vitamin B  Vitamin C  Vitamin  E ==================================================================== For clinical consultation, please call [phone]. ====================================================================    UDS interpretation: Compliant          Medication Assessment Form: Reviewed. Patient indicates being compliant with therapy Treatment compliance: Compliant Risk Assessment Profile: Aberrant behavior: See prior evaluations. None observed or detected today Comorbid factors increasing risk of overdose: See prior notes. No additional risks detected today Risk of substance use disorder (SUD): Low     Opioid Risk Tool - 05/09/17 1151      Family History of Substance Abuse   Alcohol Negative   Illegal Drugs Negative   Rx Drugs Negative     Personal History of Substance Abuse   Alcohol Negative   Illegal Drugs Negative   Rx Drugs Negative     Psychological Disease   Psychological Disease Positive   ADD Negative   OCD Negative   Bipolar Negative   Schizophrenia Negative   Depression Positive  patient takes prozac 40 mg which he thinks he is managing     Total Score   Opioid Risk Tool Scoring 3   Opioid Risk Interpretation Low Risk     ORT Scoring interpretation table:  Score <3 = Low Risk for SUD  Score between 4-7 = Moderate Risk for SUD  Score >8 = High Risk for Opioid Abuse   Risk Mitigation Strategies:  Patient Counseling: Covered Patient-Prescriber Agreement (PPA): Present and active  Notification to other healthcare providers: Done  Pharmacologic Plan: No change in therapy, at this time  Laboratory Chemistry  Inflammation Markers (CRP: Acute Phase) (ESR: Chronic Phase) Lab Results  Component Value Date   CRP 1.7 (H) 09/01/2015   ESRSEDRATE 6 09/01/2015                 Renal Function Markers Lab Results  Component Value Date   BUN 19 02/13/2017   CREATININE 1.10 02/13/2017   GFRAA >60 09/01/2015   GFRNONAA >60 09/01/2015                 Hepatic Function  Markers Lab Results  Component Value Date   AST 24 02/13/2017   ALT 25 02/13/2017   ALBUMIN 3.9 02/13/2017   ALKPHOS 53 02/13/2017   HCVAB NEGATIVE 07/22/2013                 Electrolytes Lab Results  Component Value Date   NA 140 02/13/2017   K 3.7 02/13/2017   CL 104 02/13/2017   CALCIUM 9.2 02/13/2017   MG 2.1 09/01/2015                 Neuropathy Markers Lab Results  Component Value Date   VITAMINB12 861 02/02/2016                 Bone Pathology Markers Lab Results  Component Value Date   ALKPHOS 53 02/13/2017   25OHVITD1 63 02/02/2016   25OHVITD2 <1.0 02/02/2016   25OHVITD3 63 02/02/2016   CALCIUM 9.2 02/13/2017   TESTOSTERONE 216.29 (L) 02/13/2017  Coagulation Parameters Lab Results  Component Value Date   INR 1.0 06/03/2007   LABPROT 13.7 06/03/2007   APTT 29 06/03/2007   PLT 164.0 08/04/2016                 Cardiovascular Markers Lab Results  Component Value Date   HGB 14.9 08/04/2016   HCT 42.0 08/04/2016                 Note: Lab results reviewed.  Recent Diagnostic Imaging Results  DG C-Arm 1-60 Min-No Report Fluoroscopy was utilized by the requesting physician.  No radiographic  interpretation.   Note: Imaging results reviewed.        Meds   Current Outpatient Prescriptions:  .  aspirin 81 MG tablet, Take 81 mg by mouth daily.  , Disp: , Rfl:  .  atorvastatin (LIPITOR) 20 MG tablet, Take 1 tablet (20 mg total) by mouth daily., Disp: 90 tablet, Rfl: 3 .  cetirizine (ZYRTEC) 10 MG tablet, Take 10 mg by mouth daily. As needed, Disp: , Rfl:  .  Cholecalciferol (VITAMIN D) 2000 units CAPS, Take 1 capsule by mouth daily., Disp: , Rfl:  .  clindamycin (CLEOCIN) 150 MG capsule, Take 150 mg by mouth as needed. 4 caps 1 hour before dental procedures. , Disp: , Rfl:  .  CRANBERRY PO, Take 4,200 mg by mouth daily. , Disp: , Rfl:  .  desonide (DESOWEN) 0.05 % ointment, Apply topically 2 (two) times daily. (Patient taking  differently: Apply topically as needed. ), Disp: 60 g, Rfl: 1 .  FLUoxetine (PROZAC) 40 MG capsule, TAKE 1 BY MOUTH DAILY, Disp: 90 capsule, Rfl: 1 .  fluticasone (FLONASE) 50 MCG/ACT nasal spray, Place 2 sprays into both nostrils daily., Disp: 48 g, Rfl: 1 .  furosemide (LASIX) 20 MG tablet, TAKE 1 BY MOUTH DAILY, Disp: 90 tablet, Rfl: 1 .  Garlic 1601 MG CAPS, Take 1 capsule by mouth daily.  , Disp: , Rfl:  .  Multiple Vitamin (MULTIVITAMIN PO), Take 1 tablet by mouth daily.  , Disp: , Rfl:  .  Naloxone HCl (NARCAN) 4 MG/0.1ML LIQD, Spray into one nostril. Repeat with second device into other nostril after 2-3 minutes if no or minimal response., Disp: 2 each, Rfl: 0 .  naproxen (NAPROSYN) 500 MG tablet, TAKE 1 BY MOUTH TWICE DAILY WITH A MEAL, Disp: 180 tablet, Rfl: 1 .  Omega-3 Fatty Acids (FISH OIL) 1200 MG CAPS, Take 1,200 mg by mouth daily. , Disp: , Rfl:  .  omeprazole (PRILOSEC) 20 MG capsule, TAKE 1 BY MOUTH DAILY, Disp: 90 capsule, Rfl: 3 .  polyethylene glycol powder (MIRALAX) powder, Take 1 Container by mouth as needed. , Disp: , Rfl:  .  potassium citrate (UROCIT-K) 10 MEQ (1080 MG) SR tablet, Take 10 mEq by mouth 2 (two) times daily., Disp: , Rfl:  .  Saw Palmetto, Serenoa repens, (SAW PALMETTO PO), Take 450 mg by mouth daily. , Disp: , Rfl:  .  sildenafil (REVATIO) 20 MG tablet, 3-5 tablets daily as needed, Disp: , Rfl:  .  VITAMIN B COMPLEX-C CAPS, Take by mouth daily. , Disp: , Rfl:  .  vitamin E 1000 UNIT capsule, , Disp: , Rfl:  .  [START ON 05/24/2017] oxyCODONE (OXY IR/ROXICODONE) 5 MG immediate release tablet, Take 1 tablet (5 mg total) by mouth every 6 (six) hours as needed for severe pain., Disp: 120 tablet, Rfl: 0 .  [START ON 06/23/2017] oxyCODONE (OXY IR/ROXICODONE) 5  MG immediate release tablet, Take 1 tablet (5 mg total) by mouth every 6 (six) hours as needed for severe pain., Disp: 120 tablet, Rfl: 0 .  [START ON 07/23/2017] oxyCODONE (OXY IR/ROXICODONE) 5 MG immediate  release tablet, Take 1 tablet (5 mg total) by mouth every 6 (six) hours as needed for severe pain., Disp: 120 tablet, Rfl: 0 .  [START ON 06/23/2017] oxyCODONE (OXYCONTIN) 10 mg 12 hr tablet, Take 1 tablet (10 mg total) by mouth every 12 (twelve) hours., Disp: 60 tablet, Rfl: 0 .  [START ON 05/24/2017] oxyCODONE (OXYCONTIN) 10 mg 12 hr tablet, Take 1 tablet (10 mg total) by mouth every 12 (twelve) hours., Disp: 60 tablet, Rfl: 0 .  [START ON 07/23/2017] oxyCODONE (OXYCONTIN) 10 mg 12 hr tablet, Take 1 tablet (10 mg total) by mouth every 12 (twelve) hours., Disp: 60 tablet, Rfl: 0  ROS  Constitutional: Denies any fever or chills Gastrointestinal: No reported hemesis, hematochezia, vomiting, or acute GI distress Musculoskeletal: Denies any acute onset joint swelling, redness, loss of ROM, or weakness Neurological: No reported episodes of acute onset apraxia, aphasia, dysarthria, agnosia, amnesia, paralysis, loss of coordination, or loss of consciousness  Allergies  Mr. Rion is allergic to ciprofloxacin; metformin; penicillin v potassium; penicillins; buprenorphine hcl; and morphine and related.  [place]  Drug: Mr. Kallenbach  reports that he does not use drugs. Alcohol:  reports that he does not drink alcohol. Tobacco:  reports that he has never smoked. He has never used smokeless tobacco. Medical:  has a past medical history of Anxiety; Chronic pain; Depression; History of kidney stones (April 12,2017); History of permanent cardiac pacemaker placement (05/31/2015); Kidney stones; Migraine; Prostatitis; and Sleep apnea. Surgical: Mr. Ashmead  has a past surgical history that includes Tonsillectomy (1964); Carpal tunnel release (Bilateral, 2006); Ulnar nerve repair (2008); Cervical spine surgery (2008); Rotator cuff repair (Left, 2009); pacemaker placement (2009); Lithotripsy (Bilateral, 2012); Lithotripsy (Left, 11/2015); Cardiovascular stress test (2014); and Colonoscopy (07/2007). Family: family history  includes Arthritis in his father and mother; Cancer in his father and mother; Diabetes in his mother; Heart block in his father; Heart disease in his father and mother.  Constitutional Exam  General appearance: Well nourished, well developed, and well hydrated. In no apparent acute distress Vitals:   05/09/17 1140  BP: 130/76  Pulse: 80  Resp: 16  Temp: 97.8 F (36.6 C)  TempSrc: Oral  SpO2: 98%  Weight: 228 lb (103.4 kg)  Height: 5' 7" (1.702 m)  Psych/Mental status: Alert, oriented x 3 (person, place, & time)       Eyes: PERLA Respiratory: No evidence of acute respiratory distress  Cervical Spine Area Exam  Skin & Axial Inspection: No masses, redness, edema, swelling, or associated skin lesions Alignment: Symmetrical Functional ROM: Decreased ROM      Stability: No instability detected Muscle Tone/Strength: Functionally intact. No obvious neuro-muscular anomalies detected. Sensory (Neurological): Unimpaired Palpation: No palpable anomalies              Upper Extremity (UE) Exam    Side: Right upper extremity  Side: Left upper extremity  Skin & Extremity Inspection: Skin color, temperature, and hair growth are WNL. No peripheral edema or cyanosis. No masses, redness, swelling, asymmetry, or associated skin lesions. No contractures.  Skin & Extremity Inspection: Skin color, temperature, and hair growth are WNL. No peripheral edema or cyanosis. No masses, redness, swelling, asymmetry, or associated skin lesions. No contractures.  Functional ROM: Unrestricted ROM  Functional ROM: Unrestricted ROM          Muscle Tone/Strength: Functionally intact. No obvious neuro-muscular anomalies detected.  Muscle Tone/Strength: Functionally intact. No obvious neuro-muscular anomalies detected.  Sensory (Neurological): Unimpaired          Sensory (Neurological): Unimpaired          Palpation: No palpable anomalies              Palpation: No palpable anomalies              Specialized  Test(s): Deferred         Specialized Test(s): Deferred          Thoracic Spine Area Exam  Skin & Axial Inspection: No masses, redness, or swelling Alignment: Symmetrical Functional ROM: Unrestricted ROM Stability: No instability detected Muscle Tone/Strength: Functionally intact. No obvious neuro-muscular anomalies detected. Sensory (Neurological): Unimpaired Muscle strength & Tone: No palpable anomalies  Lumbar Spine Area Exam  Skin & Axial Inspection: No masses, redness, or swelling Alignment: Symmetrical Functional ROM: Unrestricted ROM      Stability: No instability detected Muscle Tone/Strength: Functionally intact. No obvious neuro-muscular anomalies detected. Sensory (Neurological): Unimpaired Palpation: No palpable anomalies       Provocative Tests: Lumbar Hyperextension and rotation test: evaluation deferred today       Lumbar Lateral bending test: evaluation deferred today       Patrick's Maneuver: evaluation deferred today                    Gait & Posture Assessment  Ambulation: Unassisted Gait: Relatively normal for age and body habitus Posture: WNL   Lower Extremity Exam    Side: Right lower extremity  Side: Left lower extremity  Skin & Extremity Inspection: Skin color, temperature, and hair growth are WNL. No peripheral edema or cyanosis. No masses, redness, swelling, asymmetry, or associated skin lesions. No contractures.  Skin & Extremity Inspection: Skin color, temperature, and hair growth are WNL. No peripheral edema or cyanosis. No masses, redness, swelling, asymmetry, or associated skin lesions. No contractures.  Functional ROM: Unrestricted ROM          Functional ROM: Unrestricted ROM          Muscle Tone/Strength: Functionally intact. No obvious neuro-muscular anomalies detected.  Muscle Tone/Strength: Functionally intact. No obvious neuro-muscular anomalies detected.  Sensory (Neurological): Unimpaired  Sensory (Neurological): Unimpaired  Palpation: No  palpable anomalies  Palpation: No palpable anomalies   Assessment  Primary Diagnosis & Pertinent Problem List: The primary encounter diagnosis was Chronic neck pain (Location of Primary Source of Pain) (Bilateral) (L>R). Diagnoses of Chronic pain of both shoulders, Chronic cervical radicular pain, Cervical spondylosis with radiculopathy (Bilateral) (L>R), and Chronic pain syndrome were also pertinent to this visit.  Status Diagnosis  Controlled Controlled Controlled 1. Chronic neck pain (Location of Primary Source of Pain) (Bilateral) (L>R)   2. Chronic pain of both shoulders   3. Chronic cervical radicular pain   4. Cervical spondylosis with radiculopathy (Bilateral) (L>R)   5. Chronic pain syndrome     Problems updated and reviewed during this visit: No problems updated. Plan of Care  Pharmacotherapy (Medications Ordered): Meds ordered this encounter  Medications  . oxyCODONE (OXY IR/ROXICODONE) 5 MG immediate release tablet    Sig: Take 1 tablet (5 mg total) by mouth every 6 (six) hours as needed for severe pain.    Dispense:  120 tablet    Refill:  0    Do not add this  medication to the electronic "Automatic Refill" notification system. Patient may have prescription filled one day early if pharmacy is closed on scheduled refill date. Do not fill until:05/24/2017 To last until: 06/23/2017    Order Specific Question:   Supervising Provider    Answer:   Milinda Pointer [phone]  . oxyCODONE (OXY IR/ROXICODONE) 5 MG immediate release tablet    Sig: Take 1 tablet (5 mg total) by mouth every 6 (six) hours as needed for severe pain.    Dispense:  120 tablet    Refill:  0    Do not add this medication to the electronic "Automatic Refill" notification system. Patient may have prescription filled one day early if pharmacy is closed on scheduled refill date. Do not fill until: 06/23/2017 To last until:07/23/2017    Order Specific Question:   Supervising Provider    Answer:    Milinda Pointer [phone]  . oxyCODONE (OXY IR/ROXICODONE) 5 MG immediate release tablet    Sig: Take 1 tablet (5 mg total) by mouth every 6 (six) hours as needed for severe pain.    Dispense:  120 tablet    Refill:  0    Do not add this medication to the electronic "Automatic Refill" notification system. Patient may have prescription filled one day early if pharmacy is closed on scheduled refill date. Do not fill until: 07/23/2017 To last until: 08/22/2017    Order Specific Question:   Supervising Provider    Answer:   Milinda Pointer [phone]  . oxyCODONE (OXYCONTIN) 10 mg 12 hr tablet    Sig: Take 1 tablet (10 mg total) by mouth every 12 (twelve) hours.    Dispense:  60 tablet    Refill:  0    Do not add this medication to the electronic "Automatic Refill" notification system. Patient may have prescription filled one day early if pharmacy is closed on scheduled refill date. Do not fill until: 06/23/2017 To last until: 07/23/2017    Order Specific Question:   Supervising Provider    Answer:   Milinda Pointer [phone]  . oxyCODONE (OXYCONTIN) 10 mg 12 hr tablet    Sig: Take 1 tablet (10 mg total) by mouth every 12 (twelve) hours.    Dispense:  60 tablet    Refill:  0    Do not add this medication to the electronic "Automatic Refill" notification system. Patient may have prescription filled one day early if pharmacy is closed on scheduled refill date. Do not fill until: 05/24/2017 To last until: 06/23/2017    Order Specific Question:   Supervising Provider    Answer:   Milinda Pointer [phone]  . oxyCODONE (OXYCONTIN) 10 mg 12 hr tablet    Sig: Take 1 tablet (10 mg total) by mouth every 12 (twelve) hours.    Dispense:  60 tablet    Refill:  0    Do not add this medication to the electronic "Automatic Refill" notification system. Patient may have prescription filled one day early if pharmacy is closed on scheduled refill date. Do not fill until: 07/23/2017 To last  until:08/22/2017    Order Specific Question:   Supervising Provider    Answer:   Milinda Pointer [phone]   New Prescriptions   No medications on file   Medications administered today: Mr. Gregori had no medications administered during this visit. Lab-work, procedure(s), and/or referral(s): No orders of the defined types were placed in this encounter.  Imaging and/or referral(s): None  Interventional therapies: Planned, scheduled, and/or pending:  Not at this time.   Considering:  Complete a series of 5 Hyalgan intra-articular knee injections, bilaterally.  Diagnostic bilateral cervical facet block  Possible bilateral cervical facet radiofrequencyablation  Diagnostic left sided cervical epiduralsteroid injection  Diagnostic bilateral intra-articular knee injection Diagnostic bilateral genicular nerve blocks Possible bilateral genicular nerve radiofrequencyablation  Diagnostic bilateral sacroiliac joint block Possible bilateral sacroiliac joint radiofrequencyablation  Diagnostic bilateral intra-articular shoulder jointinjection  Diagnostic bilateral suprascapular nerve block Possible bilateral suprascapular nerve radiofrequencyablation  Diagnostic bilateral lumbar facet block Possible bilateral lumbar facet radiofrequencyablation  Diagnostic left L4-5 lumbar epidural steroid injection    Palliative PRN treatment(s):  Bilateral intra-articular Hyalgan knee injectionseries (injection #4)  Diagnostic bilateral cervical facetblock  Diagnostic left sided cervical epiduralsteroid injection  Diagnostic bilateral intra-articular knee injection Diagnostic bilateral genicular nerve blocks Diagnostic bilateral sacroiliac joint block Diagnostic bilateral intra-articular shoulder joint injection  Diagnostic bilateral suprascapularnerve block  Diagnostic bilateral lumbar facetblock  Diagnostic left L4-5 lumbar epidural steroid injection     Provider-requested follow-up: Return in about 3 months (around 08/09/2017) for MedMgmt.  Future Appointments Date Time Provider [place]  05/22/2017 3:00 PM [facility]  06/14/2017 1:30 PM Laverle Hobby, MD [facility] None  08/08/2017 10:30 AM Eustace Pen, LPN [facility]  08/13/2017 3:00 PM Ria Bush, MD [facility]  08/14/2017 12:45 PM Vevelyn Francois, NP [facility] None   Primary Care Physician: Ria Bush, MD Location: [facility] Note by: Vevelyn Francois NP Date: 05/09/2017; Time: 3:55 PM  Pain Score Disclaimer: We use the NRS-11 scale. This is a self-reported, subjective measurement of pain severity with only modest accuracy. It is used primarily to identify changes within a particular patient. It must be understood that outpatient pain scales are significantly less accurate that those used for research, where they can be applied under ideal controlled circumstances with minimal exposure to variables. In reality, the score is likely to be a combination of pain intensity and pain affect, where pain affect describes the degree of emotional arousal or changes in action readiness caused by the sensory experience of pain. Factors such as social and work situation, setting, emotional state, anxiety levels, expectation, and prior pain experience may influence pain perception and show large inter-individual differences that may also be affected by time variables.  Patient instructions provided during this appointment: Patient Instructions    ____________________________________________________________________________________________  Medication Rules  Applies to: All patients receiving prescriptions (written or electronic).  Pharmacy of record: Pharmacy where electronic prescriptions will be sent. If written prescriptions are taken to a different pharmacy, please inform the nursing  staff. The pharmacy listed in the electronic medical record should be the one where you would like electronic prescriptions to be sent.  Prescription refills: Only during scheduled appointments. Applies to both, written and electronic prescriptions.  NOTE: The following applies primarily to controlled substances (Opioid* Pain Medications).   Patient's responsibilities: 1. Pain Pills: Bring all pain pills to every appointment (except for procedure appointments). 2. Pill Bottles: Bring pills in original pharmacy bottle. Always bring newest bottle. Bring bottle, even if empty. 3. Medication refills: You are responsible for knowing and keeping track of what medications you need refilled. The day before your appointment, write a list of all prescriptions that need to be refilled. Bring that list to your appointment and give it to the admitting nurse. Prescriptions will be written only during appointments. If you forget a medication, it will not be "Called in", "Faxed", or "electronically sent". You  will need to get another appointment to get these prescribed. 4. Prescription Accuracy: You are responsible for carefully inspecting your prescriptions before leaving our office. Have the discharge nurse carefully go over each prescription with you, before taking them home. Make sure that your name is accurately spelled, that your address is correct. Check the name and dose of your medication to make sure it is accurate. Check the number of pills, and the written instructions to make sure they are clear and accurate. Make sure that you are given enough medication to last until your next medication refill appointment. 5. Taking Medication: Take medication as prescribed. Never take more pills than instructed. Never take medication more frequently than prescribed. Taking less pills or less frequently is permitted and encouraged, when it comes to controlled substances (written prescriptions).  6. Inform other Doctors:  Always inform, all of your healthcare providers, of all the medications you take. 7. Pain Medication from other Providers: You are not allowed to accept any additional pain medication from any other Doctor or Healthcare provider. There are two exceptions to this rule. (see below) In the event that you require additional pain medication, you are responsible for notifying us, as stated below. 8. Medication Agreement: You are responsible for carefully reading and following our Medication Agreement. This must be signed before receiving any prescriptions from our practice. Safely store a copy of your signed Agreement. Violations to the Agreement will result in no further prescriptions. (Additional copies of our Medication Agreement are available upon request.) 9. Laws, Rules, & Regulations: All patients are expected to follow all Federal and [employer], Rules, [employer]. Ignorance of the Laws does not constitute a valid excuse. The use of any illegal substances is prohibited. 10. Adopted CDC guidelines & recommendations: Target dosing levels will be at or below 60 MME/day. Use of benzodiazepines** is not recommended.  Exceptions: There are only two exceptions to the rule of not receiving pain medications from other Healthcare Providers. 1. Exception #1 (Emergencies): In the event of an emergency (i.e.: accident requiring emergency care), you are allowed to receive additional pain medication. However, you are responsible for: As soon as you are able, call our office [phone], at any time of the day or night, and leave a message stating your name, the date and nature of the emergency, and the name and dose of the medication prescribed. In the event that your call is answered by a member of our staff, make sure to document and save the date, time, and the name of the person that took your information.  2. Exception #2 (Planned Surgery): In the event that you are scheduled by another doctor or  dentist to have any type of surgery or procedure, you are allowed (for a period no longer than 30 days), to receive additional pain medication, for the acute post-op pain. However, in this case, you are responsible for picking up a copy of our "Post-op Pain Management for Surgeons" handout, and giving it to your surgeon or dentist. This document is available at our office, and does not require an appointment to obtain it. Simply go to our office during business hours (Monday-Thursday from 8:00 AM to 4:00 PM) (Friday 8:00 AM to 12:00 Noon) or if you have a scheduled appointment with [place], prior to your surgery, and ask for it by name. In addition, you will need to provide [place] with your name, name of your surgeon, type of surgery, and date of procedure or surgery.  *Opioid  medications include: morphine, codeine, oxycodone, oxymorphone, hydrocodone, hydromorphone, meperidine, tramadol, tapentadol, buprenorphine, fentanyl, methadone. **Benzodiazepine medications include: diazepam (Valium), alprazolam (Xanax), clonazepam (Klonopine), lorazepam (Ativan), clorazepate (Tranxene), chlordiazepoxide (Librium), estazolam (Prosom), oxazepam (Serax), temazepam (Restoril), triazolam (Halcion)  ____________________________________________________________________________________________  BMI Assessment: Estimated body mass index is 35.71 kg/m as calculated from the following:   Height as of this encounter: 5' 7" (1.702 m).   Weight as of this encounter: 228 lb (103.4 kg).  BMI interpretation table: BMI level Category Range association with higher incidence of chronic pain  <18 kg/m2 Underweight   18.5-24.9 kg/m2 Ideal body weight   25-29.9 kg/m2 Overweight Increased incidence by 20%  30-34.9 kg/m2 Obese (Class I) Increased incidence by 68%  35-39.9 kg/m2 Severe obesity (Class II) Increased incidence by 136%  >40 kg/m2 Extreme obesity (Class III) Increased incidence by 254%   BMI Readings from Last 4 Encounters:   05/09/17 35.71 kg/m  02/08/17 35.79 kg/m  02/06/17 35.24 kg/m  10/26/16 35.24 kg/m   Wt Readings from Last 4 Encounters:  05/09/17 228 lb (103.4 kg)  02/08/17 228 lb 8 oz (103.6 kg)  02/06/17 225 lb (102.1 kg)  10/26/16 225 lb (102.1 kg)

## 2017-05-09 NOTE — Progress Notes (Signed)
Nursing Pain Medication Assessment:  Safety precautions to be maintained throughout the outpatient stay will include: orient to surroundings, keep bed in low position, maintain call bell within reach at all times, provide assistance with transfer out of bed and ambulation.  Medication Inspection Compliance: Pill count conducted under aseptic conditions, in front of the patient. Neither the pills nor the bottle was removed from the patient's sight at any time. Once count was completed pills were immediately returned to the patient in their original bottle.  Medication #1: Oxycodone IR Pill/Patch Count: 35 of 120 pills remain Pill/Patch Appearance: Markings consistent with prescribed medication Bottle Appearance: Standard pharmacy container. Clearly labeled. Filled Date: 09 / 20 / 2018 Last Medication intake:  Yesterday  Medication #2: Oxycodone ER (OxyContin) Pill/Patch Count: 51 of 60 pills remain Pill/Patch Appearance: Markings consistent with prescribed medication Bottle Appearance: Standard pharmacy container. Clearly labeled. Filled Date: 09 / 20 / 2018 Last Medication intake:  Today

## 2017-05-09 NOTE — Patient Instructions (Addendum)
____________________________________________________________________________________________  Medication Rules  Applies to: All patients receiving prescriptions (written or electronic).  Pharmacy of record: Pharmacy where electronic prescriptions will be sent. If written prescriptions are taken to a different pharmacy, please inform the nursing staff. The pharmacy listed in the electronic medical record should be the one where you would like electronic prescriptions to be sent.  Prescription refills: Only during scheduled appointments. Applies to both, written and electronic prescriptions.  NOTE: The following applies primarily to controlled substances (Opioid* Pain Medications).   Patient's responsibilities: 1. Pain Pills: Bring all pain pills to every appointment (except for procedure appointments). 2. Pill Bottles: Bring pills in original pharmacy bottle. Always bring newest bottle. Bring bottle, even if empty. 3. Medication refills: You are responsible for knowing and keeping track of what medications you need refilled. The day before your appointment, write a list of all prescriptions that need to be refilled. Bring that list to your appointment and give it to the admitting nurse. Prescriptions will be written only during appointments. If you forget a medication, it will not be "Called in", "Faxed", or "electronically sent". You will need to get another appointment to get these prescribed. 4. Prescription Accuracy: You are responsible for carefully inspecting your prescriptions before leaving our office. Have the discharge nurse carefully go over each prescription with you, before taking them home. Make sure that your name is accurately spelled, that your address is correct. Check the name and dose of your medication to make sure it is accurate. Check the number of pills, and the written instructions to make sure they are clear and accurate. Make sure that you are given enough medication to  last until your next medication refill appointment. 5. Taking Medication: Take medication as prescribed. Never take more pills than instructed. Never take medication more frequently than prescribed. Taking less pills or less frequently is permitted and encouraged, when it comes to controlled substances (written prescriptions).  6. Inform other Doctors: Always inform, all of your healthcare providers, of all the medications you take. 7. Pain Medication from other Providers: You are not allowed to accept any additional pain medication from any other Doctor or Healthcare provider. There are two exceptions to this rule. (see below) In the event that you require additional pain medication, you are responsible for notifying us, as stated below. 8. Medication Agreement: You are responsible for carefully reading and following our Medication Agreement. This must be signed before receiving any prescriptions from our practice. Safely store a copy of your signed Agreement. Violations to the Agreement will result in no further prescriptions. (Additional copies of our Medication Agreement are available upon request.) 9. Laws, Rules, & Regulations: All patients are expected to follow all Federal and State Laws, Statutes, Rules, & Regulations. Ignorance of the Laws does not constitute a valid excuse. The use of any illegal substances is prohibited. 10. Adopted CDC guidelines & recommendations: Target dosing levels will be at or below 60 MME/day. Use of benzodiazepines** is not recommended.  Exceptions: There are only two exceptions to the rule of not receiving pain medications from other Healthcare Providers. 1. Exception #1 (Emergencies): In the event of an emergency (i.e.: accident requiring emergency care), you are allowed to receive additional pain medication. However, you are responsible for: As soon as you are able, call our office [phone], at any time of the day or night, and leave a message stating your  name, the date and nature of the emergency, and the name and dose of the medication   prescribed. In the event that your call is answered by a member of our staff, make sure to document and save the date, time, and the name of the person that took your information.  2. Exception #2 (Planned Surgery): In the event that you are scheduled by another doctor or dentist to have any type of surgery or procedure, you are allowed (for a period no longer than 30 days), to receive additional pain medication, for the acute post-op pain. However, in this case, you are responsible for picking up a copy of our "Post-op Pain Management for Surgeons" handout, and giving it to your surgeon or dentist. This document is available at our office, and does not require an appointment to obtain it. Simply go to our office during business hours (Monday-Thursday from 8:00 AM to 4:00 PM) (Friday 8:00 AM to 12:00 Noon) or if you have a scheduled appointment with [place], prior to your surgery, and ask for it by name. In addition, you will need to provide [place] with your name, name of your surgeon, type of surgery, and date of procedure or surgery.  *Opioid medications include: morphine, codeine, oxycodone, oxymorphone, hydrocodone, hydromorphone, meperidine, tramadol, tapentadol, buprenorphine, fentanyl, methadone. **Benzodiazepine medications include: diazepam (Valium), alprazolam (Xanax), clonazepam (Klonopine), lorazepam (Ativan), clorazepate (Tranxene), chlordiazepoxide (Librium), estazolam (Prosom), oxazepam (Serax), temazepam (Restoril), triazolam (Halcion)  ____________________________________________________________________________________________  BMI Assessment: Estimated body mass index is 35.71 kg/m as calculated from the following:   Height as of this encounter:  (1.702 m).   Weight as of this encounter: 228 lb (103.4 kg).  BMI interpretation table: BMI level Category Range association with higher incidence of chronic pain   <18 kg/m2 Underweight   18.5-24.9 kg/m2 Ideal body weight   25-29.9 kg/m2 Overweight Increased incidence by 20%  30-34.9 kg/m2 Obese (Class I) Increased incidence by 68%  35-39.9 kg/m2 Severe obesity (Class II) Increased incidence by 136%  >40 kg/m2 Extreme obesity (Class III) Increased incidence by 254%   BMI Readings from Last 4 Encounters:  05/09/17 35.71 kg/m  02/08/17 35.79 kg/m  02/06/17 35.24 kg/m  10/26/16 35.24 kg/m   Wt Readings from Last 4 Encounters:  05/09/17 228 lb (103.4 kg)  02/08/17 228 lb 8 oz (103.6 kg)  02/06/17 225 lb (102.1 kg)  10/26/16 225 lb (102.1 kg)

## 2017-05-15 DIAGNOSIS — I495 Sick sinus syndrome: Secondary | ICD-10-CM | POA: Diagnosis not present

## 2017-05-22 ENCOUNTER — Ambulatory Visit (INDEPENDENT_AMBULATORY_CARE_PROVIDER_SITE_OTHER): Payer: Medicare Other

## 2017-05-22 DIAGNOSIS — Z23 Encounter for immunization: Secondary | ICD-10-CM

## 2017-05-30 DIAGNOSIS — I10 Essential (primary) hypertension: Secondary | ICD-10-CM | POA: Diagnosis not present

## 2017-05-30 DIAGNOSIS — Z950 Presence of cardiac pacemaker: Secondary | ICD-10-CM | POA: Diagnosis not present

## 2017-05-30 DIAGNOSIS — R001 Bradycardia, unspecified: Secondary | ICD-10-CM | POA: Diagnosis not present

## 2017-06-14 ENCOUNTER — Institutional Professional Consult (permissible substitution): Payer: Medicare Other | Admitting: Internal Medicine

## 2017-06-14 ENCOUNTER — Encounter: Payer: Self-pay | Admitting: Internal Medicine

## 2017-06-14 ENCOUNTER — Ambulatory Visit: Payer: Medicare Other | Admitting: Internal Medicine

## 2017-06-14 VITALS — BP 158/92 | HR 69 | Ht 67.0 in | Wt 224.0 lb

## 2017-06-14 DIAGNOSIS — G4719 Other hypersomnia: Secondary | ICD-10-CM

## 2017-06-14 DIAGNOSIS — G4733 Obstructive sleep apnea (adult) (pediatric): Secondary | ICD-10-CM

## 2017-06-14 NOTE — Progress Notes (Addendum)
[facility] Consultation      Assessment and Plan:  Excessive daytime sleepiness with history of sleep apnea.  -Known history of obstructive sleep apnea diagnosed several years ago was intolerant of CPAP due to mask issues.  Now with recurrent symptoms of snoring, excessive daytime sleepiness, daytime naps. -We will send for sleep study, restarted on CPAP as needed.  Cardiac arrhythmia with history of pacemaker placement.   Anxiety.  -Sleep apnea can contribute to these above issues, therefore it is important to treat sleep apnea if present.  Orders Placed This Encounter  Procedures  . Split night study   Return in about 3 months (around 09/14/2017).  Date: 06/14/2017  MRN# [id number] Mark [name] [id number]    Mark [name] is a 61 y.o. old male seen in consultation for chief complaint of:    Chief Complaint  Patient presents with  . Advice Only    Referred by Dr. [name] for sleep evaulation: SOB w/activity: daytime sleepiness; trouble geting and staying asleep.    HPI:  He ntoes that he has trouble sleeping, he takes about an hour to fall asleep, then ends up sleeping during the day.  He has gained about a lot of fat on his throat and this may make his breathing worse, he thinks.  He snores at night, he has been told that he stops breathing in his sleep. He was diagnosed with OSA in 2007 but had could not get a mask which fit properly.  He brings in a 9 page printout of his medical history. He takes daily narcotics for neck pain.    PMHX:   Past Medical History:  Diagnosis Date  . Anxiety   . Chronic pain   . Depression   . History of kidney stones April 12,2017   removed two kidney stones about 1 cm in size  . History of permanent cardiac pacemaker placement 05/31/2015  . Kidney stones   . Migraine   . Prostatitis   . Sleep apnea    Surgical Hx:  Past Surgical History:  Procedure Laterality Date  . CARDIOVASCULAR STRESS TEST  2014   Dr  Lady Gary  . CARPAL TUNNEL RELEASE Bilateral 2006  . CERVICAL SPINE SURGERY  2008   C5/6  . COLONOSCOPY  07/2007   prostate nodule, hemorrhoid, rpt 5 yrs Mechele Collin)  . LITHOTRIPSY Bilateral 2012   Stoioff  . LITHOTRIPSY Left 11/2015   x2 Stoioff  . PACEMAKER PLACEMENT  2009   s/p Medtronic Adapta DR Pacemaker (Dr. Lady Gary)  . ROTATOR CUFF REPAIR Left 2009  . TONSILLECTOMY  1964  . ULNAR NERVE REPAIR  2008   Left   Family Hx:  Family History  Problem Relation Age of Onset  . Heart disease Mother   . Diabetes Mother   . Arthritis Mother   . Cancer Mother        Breast Cancer  . Heart block Father   . Heart disease Father   . Arthritis Father   . Cancer Father        skin cancer   Social Hx:   Social History   Tobacco Use  . Smoking status: Never Smoker  . Smokeless tobacco: Never Used  Substance Use Topics  . Alcohol use: No  . Drug use: No   Medication:    Current Outpatient Medications:  .  aspirin 81 MG tablet, Take 81 mg by mouth daily.  , Disp: , Rfl:  .  atorvastatin (LIPITOR) 20 MG  tablet, Take 1 tablet (20 mg total) by mouth daily., Disp: 90 tablet, Rfl: 3 .  cetirizine (ZYRTEC) 10 MG tablet, Take 10 mg by mouth daily. As needed, Disp: , Rfl:  .  Cholecalciferol (VITAMIN D) 2000 units CAPS, Take 1 capsule by mouth daily., Disp: , Rfl:  .  clindamycin (CLEOCIN) 150 MG capsule, Take 150 mg by mouth as needed. 4 caps 1 hour before dental procedures. , Disp: , Rfl:  .  CRANBERRY PO, Take 4,200 mg by mouth daily. , Disp: , Rfl:  .  desonide (DESOWEN) 0.05 % ointment, Apply topically 2 (two) times daily. (Patient taking differently: Apply topically as needed. ), Disp: 60 g, Rfl: 1 .  FLUoxetine (PROZAC) 40 MG capsule, TAKE 1 BY MOUTH DAILY, Disp: 90 capsule, Rfl: 1 .  fluticasone (FLONASE) 50 MCG/ACT nasal spray, Place 2 sprays into both nostrils daily., Disp: 48 g, Rfl: 1 .  furosemide (LASIX) 20 MG tablet, TAKE 1 BY MOUTH DAILY, Disp: 90 tablet, Rfl: 1 .  Garlic 1000 MG  CAPS, Take 1 capsule by mouth daily.  , Disp: , Rfl:  .  Multiple Vitamin (MULTIVITAMIN PO), Take 1 tablet by mouth daily.  , Disp: , Rfl:  .  Naloxone HCl (NARCAN) 4 MG/0.1ML LIQD, Spray into one nostril. Repeat with second device into other nostril after 2-3 minutes if no or minimal response., Disp: 2 each, Rfl: 0 .  naproxen (NAPROSYN) 500 MG tablet, TAKE 1 BY MOUTH TWICE DAILY WITH A MEAL, Disp: 180 tablet, Rfl: 1 .  Omega-3 Fatty Acids (FISH OIL) 1200 MG CAPS, Take 1,200 mg by mouth daily. , Disp: , Rfl:  .  omeprazole (PRILOSEC) 20 MG capsule, TAKE 1 BY MOUTH DAILY, Disp: 90 capsule, Rfl: 3 .  [START ON 07/23/2017] oxyCODONE (OXY IR/ROXICODONE) 5 MG immediate release tablet, Take 1 tablet (5 mg total) by mouth every 6 (six) hours as needed for severe pain., Disp: 120 tablet, Rfl: 0 .  [START ON 06/23/2017] oxyCODONE (OXYCONTIN) 10 mg 12 hr tablet, Take 1 tablet (10 mg total) by mouth every 12 (twelve) hours., Disp: 60 tablet, Rfl: 0 .  polyethylene glycol powder (MIRALAX) powder, Take 1 Container by mouth as needed. , Disp: , Rfl:  .  potassium citrate (UROCIT-K) 10 MEQ (1080 MG) SR tablet, Take 10 mEq by mouth 2 (two) times daily., Disp: , Rfl:  .  Saw Palmetto, Serenoa repens, (SAW PALMETTO PO), Take 450 mg by mouth daily. , Disp: , Rfl:  .  sildenafil (REVATIO) 20 MG tablet, 3-5 tablets daily as needed, Disp: , Rfl:  .  VITAMIN B COMPLEX-C CAPS, Take by mouth daily. , Disp: , Rfl:  .  vitamin E 1000 UNIT capsule, , Disp: , Rfl:    Allergies:  Ciprofloxacin; Metformin; Penicillin v potassium; Penicillins; Buprenorphine hcl; and Morphine and related  Review of Systems: Gen:  Denies  fever, sweats, chills HEENT: Denies blurred vision, double vision. bleeds, sore throat Cvc:  No dizziness, chest pain. Resp:   Denies cough or sputum production, shortness of breath Gi: Denies swallowing difficulty, stomach pain. Gu:  Denies bladder incontinence, burning urine Ext:   No Joint pain,  stiffness. Skin: No skin rash,  hives  Endoc:  No polyuria, polydipsia. Psych: No depression, insomnia. Other:  All other systems were reviewed with the patient and were negative other that what is mentioned in the HPI.   Physical Examination:   VS: BP (!) 158/92 (BP Location: Left Arm, Cuff Size: Normal)  Pulse 69   Ht 5\' 7"  (1.702 m)   Wt 224 lb (101.6 kg)   SpO2 98%   BMI 35.08 kg/m   General Appearance: No distress  Neuro:without focal findings,  speech normal,  HEENT: PERRLA, EOM intact.  Mallampati 4 Pulmonary: normal breath sounds, No wheezing.  CardiovascularNormal S1,S2.  No m/r/g.   Abdomen: Benign, Soft, non-tender. Renal:  No costovertebral tenderness  GU:  No performed at this time. Endoc: No evident thyromegaly, no signs of acromegaly. Skin:   warm, no rashes, no ecchymosis  Extremities: normal, no cyanosis, clubbing.  Other findings:    LABORATORY PANEL:   CBC No results for input(s): WBC, HGB, HCT, PLT in the last 168 hours. ------------------------------------------------------------------------------------------------------------------  Chemistries  No results for input(s): NA, K, CL, CO2, GLUCOSE, BUN, CREATININE, CALCIUM, MG, AST, ALT, ALKPHOS, BILITOT in the last 168 hours.  Invalid input(s): GFRCGP ------------------------------------------------------------------------------------------------------------------  Cardiac Enzymes No results for input(s): TROPONINI in the last 168 hours. ------------------------------------------------------------  RADIOLOGY:  No results found.     Thank  you for the consultation and for allowing [facility] to assist in the care of your patient. Our recommendations are noted above.  Please contact us if we can be of further service.   Wells Guileseep Aracelis Ulrey, MD.  Board Certified in Internal Medicine, Pulmonary Medicine, Critical Care Medicine, and Sleep Medicine.   Pulmonary and  Critical Care Office Number: [phone]  Santiago Gladavid Kasa, M.D.  Billy Fischeravid Simonds, M.D  06/14/2017

## 2017-06-14 NOTE — Patient Instructions (Addendum)

## 2017-07-03 ENCOUNTER — Ambulatory Visit: Payer: Medicare Other | Attending: Internal Medicine

## 2017-07-03 DIAGNOSIS — G4733 Obstructive sleep apnea (adult) (pediatric): Secondary | ICD-10-CM | POA: Insufficient documentation

## 2017-07-03 DIAGNOSIS — G4761 Periodic limb movement disorder: Secondary | ICD-10-CM | POA: Insufficient documentation

## 2017-07-03 DIAGNOSIS — R0683 Snoring: Secondary | ICD-10-CM | POA: Diagnosis not present

## 2017-07-09 DIAGNOSIS — G4733 Obstructive sleep apnea (adult) (pediatric): Secondary | ICD-10-CM | POA: Diagnosis not present

## 2017-07-10 ENCOUNTER — Telehealth: Payer: Self-pay | Admitting: *Deleted

## 2017-07-10 DIAGNOSIS — G4733 Obstructive sleep apnea (adult) (pediatric): Secondary | ICD-10-CM

## 2017-07-10 NOTE — Telephone Encounter (Signed)
Pt informed of results. Order placed for CPAP titration. Nothing further needed. 

## 2017-07-10 NOTE — Telephone Encounter (Signed)
-----   Message from Shane CrutchPradeep Ramachandran, MD sent at 07/09/2017  4:16 PM EST ----- Regarding: sleep study resuls   Final Impression Severe Obstructive Sleep Apnea with AHI of 42  Recommendations .Sleep study with CPAP titration

## 2017-07-19 ENCOUNTER — Other Ambulatory Visit: Payer: Self-pay

## 2017-07-19 MED ORDER — FLUTICASONE PROPIONATE 50 MCG/ACT NA SUSP: 2.0000 | Freq: Every day | NASAL | 1 refills | Status: DC

## 2017-07-19 MED ORDER — OMEPRAZOLE 20 MG PO CPDR: DELAYED_RELEASE_CAPSULE | ORAL | 0 refills | Status: DC

## 2017-07-19 MED ORDER — ATORVASTATIN CALCIUM 20 MG PO TABS: 20.0000 mg | ORAL_TABLET | Freq: Every day | ORAL | 0 refills | Status: DC

## 2017-07-26 ENCOUNTER — Ambulatory Visit: Payer: Medicare Other | Attending: Internal Medicine

## 2017-07-26 DIAGNOSIS — G4733 Obstructive sleep apnea (adult) (pediatric): Secondary | ICD-10-CM | POA: Diagnosis present

## 2017-07-27 ENCOUNTER — Telehealth: Payer: Self-pay

## 2017-07-27 NOTE — Telephone Encounter (Signed)
Pt pharmacy sent a refill request of potassium citrate. Please advise.

## 2017-08-01 MED ORDER — POTASSIUM CITRATE ER 10 MEQ (1080 MG) PO TBCR: 10.0000 meq | EXTENDED_RELEASE_TABLET | Freq: Two times a day (BID) | ORAL | 0 refills | Status: DC

## 2017-08-01 NOTE — Telephone Encounter (Signed)
I sent in the refill.  He will need a follow-up KUB and renal ultrasound March 2019

## 2017-08-06 ENCOUNTER — Telehealth: Payer: Self-pay | Admitting: *Deleted

## 2017-08-06 DIAGNOSIS — G4733 Obstructive sleep apnea (adult) (pediatric): Secondary | ICD-10-CM

## 2017-08-06 NOTE — Telephone Encounter (Signed)
Left message to return call for results of Sleep Study.

## 2017-08-07 ENCOUNTER — Other Ambulatory Visit: Payer: Self-pay | Admitting: Family Medicine

## 2017-08-07 DIAGNOSIS — D696 Thrombocytopenia, unspecified: Secondary | ICD-10-CM

## 2017-08-07 DIAGNOSIS — Z22322 Carrier or suspected carrier of Methicillin resistant Staphylococcus aureus: Secondary | ICD-10-CM

## 2017-08-07 DIAGNOSIS — R7989 Other specified abnormal findings of blood chemistry: Secondary | ICD-10-CM

## 2017-08-07 DIAGNOSIS — E785 Hyperlipidemia, unspecified: Secondary | ICD-10-CM

## 2017-08-07 DIAGNOSIS — Z125 Encounter for screening for malignant neoplasm of prostate: Secondary | ICD-10-CM

## 2017-08-07 DIAGNOSIS — R739 Hyperglycemia, unspecified: Secondary | ICD-10-CM

## 2017-08-07 HISTORY — DX: Carrier or suspected carrier of methicillin resistant Staphylococcus aureus: Z22.322

## 2017-08-08 ENCOUNTER — Ambulatory Visit: Payer: Medicare Other

## 2017-08-08 ENCOUNTER — Other Ambulatory Visit (INDEPENDENT_AMBULATORY_CARE_PROVIDER_SITE_OTHER): Payer: Medicare Other

## 2017-08-08 DIAGNOSIS — R7989 Other specified abnormal findings of blood chemistry: Secondary | ICD-10-CM | POA: Diagnosis not present

## 2017-08-08 DIAGNOSIS — D696 Thrombocytopenia, unspecified: Secondary | ICD-10-CM | POA: Diagnosis not present

## 2017-08-08 DIAGNOSIS — R739 Hyperglycemia, unspecified: Secondary | ICD-10-CM

## 2017-08-08 DIAGNOSIS — E785 Hyperlipidemia, unspecified: Secondary | ICD-10-CM

## 2017-08-08 DIAGNOSIS — Z125 Encounter for screening for malignant neoplasm of prostate: Secondary | ICD-10-CM | POA: Diagnosis not present

## 2017-08-08 LAB — LIPID PANEL
Cholesterol: 104 mg/dL (ref 0–200)
HDL: 33.5 mg/dL — ABNORMAL LOW (ref >39.00)
LDL Cholesterol: 46 mg/dL (ref 0–99)
NonHDL: 70.45
Total CHOL/HDL Ratio: 3
Triglycerides: 121 mg/dL (ref 0.0–149.0)
VLDL: 24.2 mg/dL (ref 0.0–40.0)

## 2017-08-08 LAB — CBC WITH DIFFERENTIAL/PLATELET
Basophils Absolute: 0.1 10*3/uL (ref 0.0–0.1)
Basophils Relative: 0.8 % (ref 0.0–3.0)
Eosinophils Absolute: 0.2 10*3/uL (ref 0.0–0.7)
Eosinophils Relative: 2.6 % (ref 0.0–5.0)
HCT: 43.7 % (ref 39.0–52.0)
Hemoglobin: 15.2 g/dL (ref 13.0–17.0)
Lymphocytes Relative: 32.6 % (ref 12.0–46.0)
Lymphs Abs: 2.9 10*3/uL (ref 0.7–4.0)
MCHC: 34.8 g/dL (ref 30.0–36.0)
MCV: 86.8 fl (ref 78.0–100.0)
Monocytes Absolute: 1.2 10*3/uL — ABNORMAL HIGH (ref 0.1–1.0)
Monocytes Relative: 13 % — ABNORMAL HIGH (ref 3.0–12.0)
Neutro Abs: 4.5 10*3/uL (ref 1.4–7.7)
Neutrophils Relative %: 51 % (ref 43.0–77.0)
Platelets: 186 10*3/uL (ref 150.0–400.0)
RBC: 5.03 Mil/uL (ref 4.22–5.81)
RDW: 13.2 % (ref 11.5–15.5)
WBC: 8.9 10*3/uL (ref 4.0–10.5)

## 2017-08-08 LAB — COMPREHENSIVE METABOLIC PANEL
ALT: 25 U/L (ref 0–53)
AST: 23 U/L (ref 0–37)
Albumin: 4 g/dL (ref 3.5–5.2)
Alkaline Phosphatase: 54 U/L (ref 39–117)
BUN: 18 mg/dL (ref 6–23)
CO2: 25 mEq/L (ref 19–32)
Calcium: 8.9 mg/dL (ref 8.4–10.5)
Chloride: 103 mEq/L (ref 96–112)
Creatinine, Ser: 1.03 mg/dL (ref 0.40–1.50)
GFR: 78.14 mL/min (ref >60.00)
Glucose, Bld: 119 mg/dL — ABNORMAL HIGH (ref 70–99)
Potassium: 3.8 mEq/L (ref 3.5–5.1)
Sodium: 139 mEq/L (ref 135–145)
Total Bilirubin: 0.8 mg/dL (ref 0.2–1.2)
Total Protein: 6.1 g/dL (ref 6.0–8.3)

## 2017-08-08 LAB — PSA: PSA: 0.82 ng/mL (ref 0.10–4.00)

## 2017-08-08 LAB — HEMOGLOBIN A1C: Hgb A1c MFr Bld: 5.5 % (ref 4.6–6.5)

## 2017-08-08 NOTE — Telephone Encounter (Signed)
Pt aware of results. Orders entered. Nothing further needed. 

## 2017-08-12 LAB — TESTOS,TOTAL,FREE AND SHBG (FEMALE)
Free Testosterone: 44.1 pg/mL (ref 35.0–155.0)
Sex Hormone Binding: 85 nmol/L — ABNORMAL HIGH (ref 22–77)
Testosterone, Total, LC-MS-MS: 495 ng/dL (ref 250–1100)

## 2017-08-13 ENCOUNTER — Encounter: Payer: Self-pay | Admitting: Family Medicine

## 2017-08-13 ENCOUNTER — Ambulatory Visit (INDEPENDENT_AMBULATORY_CARE_PROVIDER_SITE_OTHER): Payer: Medicare Other | Admitting: Family Medicine

## 2017-08-13 ENCOUNTER — Ambulatory Visit (INDEPENDENT_AMBULATORY_CARE_PROVIDER_SITE_OTHER): Payer: Medicare Other

## 2017-08-13 VITALS — BP 138/78 | HR 71 | Temp 98.4°F | Ht 67.0 in | Wt 225.0 lb

## 2017-08-13 DIAGNOSIS — R7989 Other specified abnormal findings of blood chemistry: Secondary | ICD-10-CM | POA: Diagnosis not present

## 2017-08-13 DIAGNOSIS — G894 Chronic pain syndrome: Secondary | ICD-10-CM | POA: Diagnosis not present

## 2017-08-13 DIAGNOSIS — Z7189 Other specified counseling: Secondary | ICD-10-CM

## 2017-08-13 DIAGNOSIS — Z0000 Encounter for general adult medical examination without abnormal findings: Secondary | ICD-10-CM

## 2017-08-13 DIAGNOSIS — F331 Major depressive disorder, recurrent, moderate: Secondary | ICD-10-CM | POA: Diagnosis not present

## 2017-08-13 DIAGNOSIS — G4733 Obstructive sleep apnea (adult) (pediatric): Secondary | ICD-10-CM

## 2017-08-13 DIAGNOSIS — Z79891 Long term (current) use of opiate analgesic: Secondary | ICD-10-CM

## 2017-08-13 DIAGNOSIS — E785 Hyperlipidemia, unspecified: Secondary | ICD-10-CM | POA: Diagnosis not present

## 2017-08-13 NOTE — Progress Notes (Signed)
BP 138/78 (BP Location: Right Arm, Patient Position: Sitting, Cuff Size: Normal)   Pulse 71   Temp 98.4 F (36.9 C) (Oral)   Ht 5\' 7"  (1.702 m) Comment: no shoes  Wt 225 lb (102.1 kg)   SpO2 96%   BMI 35.24 kg/m    CC: CPE Subjective:    Patient ID: [name], male    DOB: 1956/02/24, 62 y.o.   MRN: [id number]  HPI: JENCARLOS NICOLSON is a 62 y.o. male presenting on 08/13/2017 for Annual Exam (Pt 2)   Saw Virl Axe last week for medicare wellness visit. Note reviewed.   Sugars have been elevated recently - checking fasting 140-200. Metformin in the past caused bad acid reflux.  Coming up on pacemaker battery life (Fath).   Preventative: COLONOSCOPY 07/2007 prostate nodule, hemorrhoid, rpt 5 yrs Mechele Collin). Requests cologuard.  Prostate cancer screening with nodule - followed by urology Dr Lonna Cobb.  Lung cancer screening - not indicated Flu shot yearly Tdap 2011 Pneumovax 2007 shingrix - discussed Advanced directive discussion - spoke with Virl Axe - received advanced directive packet again. HCPOA would be Bethann Berkshire (lifelong friend).  Seat belt use discussed Sunscreen use discussed, no changing moles on skin.  Ex smoker Alcohol - none   Divorced. Lives with wife. Edu: 8th grade Occ: retired, disability for chronic neck pain  Activity: no regular exercise - limited by back and knee pain Diet: increasing water, backing off sweetened beverages, poor vegetables and fruits  Relevant past medical, surgical, family and social history reviewed and updated as indicated. Interim medical history since our last visit reviewed. Allergies and medications reviewed and updated. Outpatient Medications Prior to Visit  Medication Sig Dispense Refill  . aspirin 81 MG tablet Take 81 mg by mouth daily.      Marland Kitchen atorvastatin (LIPITOR) 20 MG tablet Take 1 tablet (20 mg total) by mouth daily. 90 tablet 0  . cetirizine (ZYRTEC) 10 MG tablet Take 10 mg by mouth daily. As needed    . Cholecalciferol  (VITAMIN D) 2000 units CAPS Take 1 capsule by mouth daily.    . clindamycin (CLEOCIN) 150 MG capsule Take 150 mg by mouth as needed. 4 caps 1 hour before dental procedures.     Marland Kitchen CRANBERRY PO Take 4,200 mg by mouth daily.     Marland Kitchen desonide (DESOWEN) 0.05 % ointment Apply topically 2 (two) times daily. (Patient taking differently: Apply topically as needed. ) 60 g 1  . FLUoxetine (PROZAC) 40 MG capsule TAKE 1 BY MOUTH DAILY 90 capsule 1  . fluticasone (FLONASE) 50 MCG/ACT nasal spray Place 2 sprays into both nostrils daily. 48 g 1  . furosemide (LASIX) 20 MG tablet TAKE 1 BY MOUTH DAILY 90 tablet 1  . Garlic 1000 MG CAPS Take 1 capsule by mouth daily.      . Multiple Vitamin (MULTIVITAMIN PO) Take 1 tablet by mouth daily.      . Naloxone HCl (NARCAN) 4 MG/0.1ML LIQD Spray into one nostril. Repeat with second device into other nostril after 2-3 minutes if no or minimal response. 2 each 0  . naproxen (NAPROSYN) 500 MG tablet TAKE 1 BY MOUTH TWICE DAILY WITH A MEAL 180 tablet 1  . Omega-3 Fatty Acids (FISH OIL) 1200 MG CAPS Take 1,200 mg by mouth daily.     Marland Kitchen omeprazole (PRILOSEC) 20 MG capsule TAKE 1 BY MOUTH DAILY 90 capsule 0  . oxyCODONE (OXY IR/ROXICODONE) 5 MG immediate release tablet Take 1 tablet (5 mg  total) by mouth every 6 (six) hours as needed for severe pain. 120 tablet 0  . polyethylene glycol powder (MIRALAX) powder Take 1 Container by mouth as needed.     . potassium citrate (UROCIT-K) 10 MEQ (1080 MG) SR tablet Take 1 tablet (10 mEq total) by mouth 2 (two) times daily. 180 tablet 0  . Saw Palmetto, Serenoa repens, (SAW PALMETTO PO) Take 450 mg by mouth daily.     . sildenafil (REVATIO) 20 MG tablet 3-5 tablets daily as needed    . VITAMIN B COMPLEX-C CAPS Take by mouth daily.     . vitamin E 1000 UNIT capsule     . oxyCODONE (OXYCONTIN) 10 mg 12 hr tablet Take 1 tablet (10 mg total) by mouth every 12 (twelve) hours. 60 tablet 0   No facility-administered medications prior to visit.        Per HPI unless specifically indicated in ROS section below Review of Systems  Constitutional: Negative for activity change, appetite change, chills, fatigue, fever and unexpected weight change.  HENT: Negative for hearing loss.   Eyes: Negative for visual disturbance.  Respiratory: Negative for cough, chest tightness, shortness of breath and wheezing.   Cardiovascular: Negative for chest pain, palpitations and leg swelling.  Gastrointestinal: Negative for abdominal distention, abdominal pain, blood in stool, constipation, diarrhea, nausea and vomiting.  Genitourinary: Negative for difficulty urinating and hematuria.  Musculoskeletal: Negative for arthralgias, myalgias and neck pain.  Skin: Negative for rash.  Neurological: Negative for dizziness, seizures, syncope and headaches.  Hematological: Negative for adenopathy. Does not bruise/bleed easily.  Psychiatric/Behavioral: Negative for dysphoric mood. The patient is not nervous/anxious.        Objective:    BP 138/78 (BP Location: Right Arm, Patient Position: Sitting, Cuff Size: Normal)   Pulse 71   Temp 98.4 F (36.9 C) (Oral)   Ht 5\' 7"  (1.702 m) Comment: no shoes  Wt 225 lb (102.1 kg)   SpO2 96%   BMI 35.24 kg/m   Wt Readings from Last 3 Encounters:  08/13/17 225 lb (102.1 kg)  08/13/17 225 lb (102.1 kg)  06/14/17 224 lb (101.6 kg)    Physical Exam  Constitutional: He is oriented to person, place, and time. He appears well-developed and well-nourished. No distress.  HENT:  Head: Normocephalic and atraumatic.  Right Ear: Hearing, tympanic membrane, external ear and ear canal normal.  Left Ear: Hearing, tympanic membrane, external ear and ear canal normal.  Nose: Nose normal.  Mouth/Throat: Uvula is midline, oropharynx is clear and moist and mucous membranes are normal. No oropharyngeal exudate, posterior oropharyngeal edema or posterior oropharyngeal erythema.  Eyes: Conjunctivae and EOM are normal. Pupils are equal,  round, and reactive to light. No scleral icterus.  Neck: Normal range of motion. Neck supple. No thyromegaly present.  Cardiovascular: Normal rate, regular rhythm, normal heart sounds and intact distal pulses.  No murmur heard. Pulses:      Radial pulses are 2+ on the right side, and 2+ on the left side.  Pulmonary/Chest: Effort normal and breath sounds normal. No respiratory distress. He has no wheezes. He has no rales.  Abdominal: Soft. Bowel sounds are normal. He exhibits no distension and no mass. There is no tenderness. There is no rebound and no guarding.  Musculoskeletal: Normal range of motion. He exhibits no edema.  Lymphadenopathy:    He has no cervical adenopathy.  Neurological: He is alert and oriented to person, place, and time.  CN grossly intact, station and gait  intact  Skin: Skin is warm and dry. No rash noted.  Psychiatric: He has a normal mood and affect. His behavior is normal. Judgment and thought content normal.  Nursing note and vitals reviewed.  Results for orders placed or performed in visit on 08/08/17  CBC with Differential/Platelet  Result Value Ref Range   WBC 8.9 4.0 - 10.5 K/uL   RBC 5.03 4.22 - 5.81 Mil/uL   Hemoglobin 15.2 13.0 - 17.0 g/dL   HCT 44.043.7 10.239.0 - 72.552.0 %   MCV 86.8 78.0 - 100.0 fl   MCHC 34.8 30.0 - 36.0 g/dL   RDW 36.613.2 44.011.5 - 34.715.5 %   Platelets 186.0 150.0 - 400.0 K/uL   Neutrophils Relative % 51.0 43.0 - 77.0 %   Lymphocytes Relative 32.6 12.0 - 46.0 %   Monocytes Relative 13.0 (H) 3.0 - 12.0 %   Eosinophils Relative 2.6 0.0 - 5.0 %   Basophils Relative 0.8 0.0 - 3.0 %   Neutro Abs 4.5 1.4 - 7.7 K/uL   Lymphs Abs 2.9 0.7 - 4.0 K/uL   Monocytes Absolute 1.2 (H) 0.1 - 1.0 K/uL   Eosinophils Absolute 0.2 0.0 - 0.7 K/uL   Basophils Absolute 0.1 0.0 - 0.1 K/uL  Testos,Total,Free and SHBG (Male)  Result Value Ref Range   Testosterone, Total, LC-MS-MS 495 250 - 1,100 ng/dL   Free Testosterone 42.544.1 35.0 - 155.0 pg/mL   Sex Hormone  Binding 85 (H) 22 - 77 nmol/L  PSA  Result Value Ref Range   PSA 0.82 0.10 - 4.00 ng/mL  Hemoglobin A1c  Result Value Ref Range   Hgb A1c MFr Bld 5.5 4.6 - 6.5 %  Lipid panel  Result Value Ref Range   Cholesterol 104 0 - 200 mg/dL   Triglycerides 956.3121.0 0.0 - 149.0 mg/dL   HDL 87.5633.50 (L) >43.32>39.00 mg/dL   VLDL 95.124.2 0.0 - 88.440.0 mg/dL   LDL Cholesterol 46 0 - 99 mg/dL   Total CHOL/HDL Ratio 3    NonHDL 70.45   Comprehensive metabolic panel  Result Value Ref Range   Sodium 139 135 - 145 mEq/L   Potassium 3.8 3.5 - 5.1 mEq/L   Chloride 103 96 - 112 mEq/L   CO2 25 19 - 32 mEq/L   Glucose, Bld 119 (H) 70 - 99 mg/dL   BUN 18 6 - 23 mg/dL   Creatinine, Ser 1.661.03 0.40 - 1.50 mg/dL   Total Bilirubin 0.8 0.2 - 1.2 mg/dL   Alkaline Phosphatase 54 39 - 117 U/L   AST 23 0 - 37 U/L   ALT 25 0 - 53 U/L   Total Protein 6.1 6.0 - 8.3 g/dL   Albumin 4.0 3.5 - 5.2 g/dL   Calcium 8.9 8.4 - 06.310.5 mg/dL   GFR 01.6078.14 >10.93>60.00 mL/min      Assessment & Plan:  Encouraged he buy new glucometer as our readings are better than home readings.  Problem List Items Addressed This Visit    Advanced care planning/counseling discussion    Advanced directive discussion - spoke with Virl AxeLesia - received advanced directive packet again. HCPOA would be [name] (lifelong friend).       Chronic pain syndrome (Chronic)    Followed by pain management      Health maintenance examination - Primary    Preventative protocols reviewed and updated unless pt declined. Discussed healthy diet and lifestyle.       Hyperlipidemia    Chronic, stable. Continue lipitor. Discussed aerobic exercise to  improve readings.  The ASCVD Risk score Denman George DC Jr., et al., 2013) failed to calculate for the following reasons:   The valid total cholesterol range is 130 to 320 mg/dL       Long term current use of opiate analgesic (Chronic)    Followed by pain clinic ([facility])      Low testosterone in male    Testosterone levels have  normalized off medication.       MDD (major depressive disorder), recurrent episode, moderate (HCC)    Stable period longterm on prozac, prior on klonopin.       Obstructive sleep apnea    Pending CPAP titration.  Pt hopeful to be able to better tolerate CPAP mask.       Severe obesity (BMI 35.0-39.9) with comorbidity (HCC)    Discussed healthy diet and lifestyle changes to affect sustainable weight loss.          Follow up plan: Return in about 6 months (around 02/10/2018) for follow up visit.  [name], MD

## 2017-08-13 NOTE — Progress Notes (Signed)
Subjective:   Mark [name] is a 62 y.o. male who presents for Medicare Annual/Subsequent preventive examination.  Review of Systems: N/A Cardiac Risk Factors include: advanced age (>63men, >4 women);obesity (BMI >30kg/m2);dyslipidemia;male gender     Objective:    Vitals: BP 138/78 (BP Location: Right Arm, Patient Position: Sitting, Cuff Size: Normal)   Pulse 71   Temp 98.4 F (36.9 C) (Oral)   Ht 5\' 7"  (1.702 m) Comment: no shoes  Wt 225 lb (102.1 kg)   SpO2 96%   BMI 35.24 kg/m   Body mass index is 35.24 kg/m.  Advanced Directives 08/13/2017 05/09/2017 02/06/2017 10/26/2016 08/04/2016 07/14/2016 07/03/2016  Does Patient Have a Medical Advance Directive? No No No No No No No  Type of Advance Directive - - - - - - -  Does patient want to make changes to medical advance directive? - - - - - - -  Copy of Healthcare Power of Attorney in Chart? - - - - - - -  Would patient like information on creating a medical advance directive? No - Patient declined - - No - Patient declined - - -    Tobacco Social History   Tobacco Use  Smoking Status Never Smoker  Smokeless Tobacco Never Used     Counseling given: No   Clinical Intake:  Pre-visit preparation completed: Yes  Pain : 0-10 Pain Score: 3  Pain Type: Chronic pain Pain Location: Neck(shoulders) Pain Orientation: Left, Right Pain Onset: More than a month ago Pain Frequency: Constant     Nutritional Status: BMI > 30  Obese Nutritional Risks: None Diabetes: No  How often do you need to have someone help you when you read instructions, pamphlets, or other written materials from your doctor or pharmacy?: 1 - Never What is the last grade level you completed in school?: 8th grade  Interpreter Needed?: No  Comments: pt lives alone Information entered by :: LPinson, LPN  Past Medical History:  Diagnosis Date  . Anxiety   . Chronic pain   . Depression   . History of kidney stones April 12,2017   removed two kidney  stones about 1 cm in size  . History of permanent cardiac pacemaker placement 05/31/2015  . Kidney stones   . Migraine   . Prostatitis   . Sleep apnea    Past Surgical History:  Procedure Laterality Date  . CARDIOVASCULAR STRESS TEST  2014   Dr Lady Gary  . CARPAL TUNNEL RELEASE Bilateral 2006  . CERVICAL SPINE SURGERY  2008   C5/6  . COLONOSCOPY  07/2007   prostate nodule, hemorrhoid, rpt 5 yrs Mechele Collin)  . LITHOTRIPSY Bilateral 2012   Stoioff  . LITHOTRIPSY Left 11/2015   x2 Stoioff  . PACEMAKER PLACEMENT  2009   s/p Medtronic Adapta DR Pacemaker (Dr. Lady Gary)  . ROTATOR CUFF REPAIR Left 2009  . TONSILLECTOMY  1964  . ULNAR NERVE REPAIR  2008   Left   Family History  Problem Relation Age of Onset  . Heart disease Mother   . Diabetes Mother   . Arthritis Mother   . Cancer Mother        Breast Cancer  . Heart block Father   . Heart disease Father   . Arthritis Father   . Cancer Father        skin cancer   Social History   Socioeconomic History  . Marital status: Divorced    Spouse name: None  . Number of children: None  .  Years of education: None  . Highest education level: None  Social Needs  . Financial resource strain: None  . Food insecurity - worry: None  . Food insecurity - inability: None  . Transportation needs - medical: None  . Transportation needs - non-medical: None  Occupational History  . None  Tobacco Use  . Smoking status: Never Smoker  . Smokeless tobacco: Never Used  Substance and Sexual Activity  . Alcohol use: No  . Drug use: No  . Sexual activity: No  Other Topics Concern  . None  Social History Narrative   Divorced. Lives with wife.   Edu: 8th grade   Occ: retired, disability for chronic neck pain    Activity: no regular exercise - limited by back and knee pain   Diet: poor water, lots of tang and gatorade, no vegetables or fruits    Outpatient Encounter Medications as of 08/13/2017  Medication Sig  . aspirin 81 MG tablet Take 81  mg by mouth daily.    Marland Kitchen atorvastatin (LIPITOR) 20 MG tablet Take 1 tablet (20 mg total) by mouth daily.  . cetirizine (ZYRTEC) 10 MG tablet Take 10 mg by mouth daily. As needed  . Cholecalciferol (VITAMIN D) 2000 units CAPS Take 1 capsule by mouth daily.  . clindamycin (CLEOCIN) 150 MG capsule Take 150 mg by mouth as needed. 4 caps 1 hour before dental procedures.   Marland Kitchen CRANBERRY PO Take 4,200 mg by mouth daily.   Marland Kitchen desonide (DESOWEN) 0.05 % ointment Apply topically 2 (two) times daily. (Patient taking differently: Apply topically as needed. )  . FLUoxetine (PROZAC) 40 MG capsule TAKE 1 BY MOUTH DAILY  . fluticasone (FLONASE) 50 MCG/ACT nasal spray Place 2 sprays into both nostrils daily.  . furosemide (LASIX) 20 MG tablet TAKE 1 BY MOUTH DAILY  . Garlic 1000 MG CAPS Take 1 capsule by mouth daily.    . Multiple Vitamin (MULTIVITAMIN PO) Take 1 tablet by mouth daily.    . Naloxone HCl (NARCAN) 4 MG/0.1ML LIQD Spray into one nostril. Repeat with second device into other nostril after 2-3 minutes if no or minimal response.  . naproxen (NAPROSYN) 500 MG tablet TAKE 1 BY MOUTH TWICE DAILY WITH A MEAL  . Omega-3 Fatty Acids (FISH OIL) 1200 MG CAPS Take 1,200 mg by mouth daily.   Marland Kitchen omeprazole (PRILOSEC) 20 MG capsule TAKE 1 BY MOUTH DAILY  . oxyCODONE (OXY IR/ROXICODONE) 5 MG immediate release tablet Take 1 tablet (5 mg total) by mouth every 6 (six) hours as needed for severe pain.  . polyethylene glycol powder (MIRALAX) powder Take 1 Container by mouth as needed.   . potassium citrate (UROCIT-K) 10 MEQ (1080 MG) SR tablet Take 1 tablet (10 mEq total) by mouth 2 (two) times daily.  . Saw Palmetto, Serenoa repens, (SAW PALMETTO PO) Take 450 mg by mouth daily.   . sildenafil (REVATIO) 20 MG tablet 3-5 tablets daily as needed  . VITAMIN B COMPLEX-C CAPS Take by mouth daily.   . vitamin E 1000 UNIT capsule   . oxyCODONE (OXYCONTIN) 10 mg 12 hr tablet Take 1 tablet (10 mg total) by mouth every 12 (twelve)  hours.   No facility-administered encounter medications on file as of 08/13/2017.     Activities of Daily Living In your present state of health, do you have any difficulty performing the following activities: 08/13/2017  Hearing? Y  Comment pt reports intermittent ringing ears  Vision? N  Difficulty concentrating or making decisions?  N  Walking or climbing stairs? N  Dressing or bathing? N  Doing errands, shopping? N  Preparing Food and eating ? N  Using the Toilet? N  In the past six months, have you accidently leaked urine? N  Do you have problems with loss of bowel control? N  Managing your Medications? N  Managing your Finances? N  Housekeeping or managing your Housekeeping? N  Some recent data might be hidden    Patient Care Team: Eustaquio BoydenGutierrez, Javier, MD as PCP - General (Family Medicine) Jerelyn Scottafael, James, DDS, PA as Referring Physician (Dentistry) Delano MetzNaveira, Francisco, MD as Referring Physician (Pain Medicine) Dalia HeadingFath, Kenneth A, MD as Consulting Physician (Cardiology) Shane Crutchamachandran, Pradeep, MD as Consulting Physician (Pulmonary Disease) Riki AltesStoioff, Scott C, MD (Urology) Dewitt Rotaurner, W. Lee, OD as Referring Physician (Optometry)   Assessment:   This is a routine wellness examination for [place].   Hearing Screening   125Hz  250Hz  500Hz  1000Hz  2000Hz  3000Hz  4000Hz  6000Hz  8000Hz   Right ear:   40 40 40  40    Left ear:   0 40 40  40      Visual Acuity Screening   Right eye Left eye Both eyes  Without correction:     With correction: 20/30-2 20/40 20/20-2  Comments: Eye exam scheduled Jan 2019     Exercise Activities and Dietary recommendations Exercise limited by: None identified  Goals    . Follow up with Primary Care Provider     Starting 08/13/2017, I will continue to take medications as prescribed and to keep appointments with PCP as scheduled.        Fall Risk Fall Risk  08/13/2017 05/09/2017 02/06/2017 10/26/2016 08/04/2016  Falls in the past year? No No No No No     Depression Screen PHQ 2/9 Scores 08/13/2017 05/09/2017 02/06/2017 10/26/2016  PHQ - 2 Score 1 0 0 0  PHQ- 9 Score 6 - - -  Exception Documentation - - - -    Cognitive Function MMSE - Mini Mental State Exam 08/13/2017 08/04/2016  Orientation to time 5 5  Orientation to Place 5 5  Registration 3 3  Attention/ Calculation 0 0  Recall 3 1  Recall-comments - pt was unable to recall 2 of 3 words  Language- name 2 objects 0 0  Language- repeat 1 1  Language- follow 3 step command 3 3  Language- read & follow direction 0 0  Write a sentence 0 0  Copy design 0 0  Total score 20 18     PLEASE NOTE: A Mini-Cog screen was completed. Maximum score is 20. A value of 0 denotes this part of Folstein MMSE was not completed or the patient failed this part of the Mini-Cog screening.   Mini-Cog Screening Orientation to Time - Max 5 pts Orientation to Place - Max 5 pts Registration - Max 3 pts Recall - Max 3 pts Language Repeat - Max 1 pts Language Follow 3 Step Command - Max 3 pts     Immunization History  Administered Date(s) Administered  . Influenza,inj,Quad PF,6+ Mos 04/22/2013, 04/28/2014, 05/07/2015, 04/11/2016, 05/22/2017  . Pneumococcal Polysaccharide-23 05/30/2006  . Tdap 11/19/2009    Screening Tests Health Maintenance  Topic Date Due  . COLONOSCOPY  08/06/2018 (Originally 08/04/2017)  . DTaP/Tdap/Td (2 - Td) 11/20/2019  . TETANUS/TDAP  11/20/2019  . INFLUENZA VACCINE  Completed  . Hepatitis C Screening  Completed  . HIV Screening  Completed      Plan:     I have personally  reviewed, addressed, and noted the following in the patient's chart:  A. Medical and social history B. Use of alcohol, tobacco or illicit drugs  C. Current medications and supplements D. Functional ability and status E.  Nutritional status F.  Physical activity G. Advance directives H. List of other physicians I.  Hospitalizations, surgeries, and ER visits in previous 12 months J.   Vitals K. Screenings to include hearing, vision, cognitive, depression L. Referrals and appointments - none  In addition, I have reviewed and discussed with patient certain preventive protocols, quality metrics, and best practice recommendations. A written personalized care plan for preventive services as well as general preventive health recommendations were provided to patient.  See attached scanned questionnaire for additional information.   Signed,   Randa Evens, MHA, BS, LPN Health Coach

## 2017-08-13 NOTE — Patient Instructions (Addendum)
We will sign you up for cologuard.  If interested, check with pharmacy about new 2 shot shingles series (shingrix).  Consider stationary bicycle.  Good to see you today, call us with questions. Return in 6 months for follow up visit.   Health Maintenance, Male A healthy lifestyle and preventive care is important for your health and wellness. Ask your health care provider about what schedule of regular examinations is right for you. What should I know about weight and diet? Eat a Healthy Diet  Eat plenty of vegetables, fruits, whole grains, low-fat dairy products, and lean protein.  Do not eat a lot of foods high in solid fats, added sugars, or salt.  Maintain a Healthy Weight Regular exercise can help you achieve or maintain a healthy weight. You should:  Do at least 150 minutes of exercise each week. The exercise should increase your heart rate and make you sweat (moderate-intensity exercise).  Do strength-training exercises at least twice a week.  Watch Your Levels of Cholesterol and Blood Lipids  Have your blood tested for lipids and cholesterol every 5 years starting at 61 years of age. If you are at high risk for heart disease, you should start having your blood tested when you are 61 years old. You may need to have your cholesterol levels checked more often if: ? Your lipid or cholesterol levels are high. ? You are older than 61 years of age. ? You are at high risk for heart disease.  What should I know about cancer screening? Many types of cancers can be detected early and may often be prevented. Lung Cancer  You should be screened every year for lung cancer if: ? You are a current smoker who has smoked for at least 30 years. ? You are a former smoker who has quit within the past 15 years.  Talk to your health care provider about your screening options, when you should start screening, and how often you should be screened.  Colorectal Cancer  Routine colorectal cancer  screening usually begins at 61 years of age and should be repeated every 5-10 years until you are 61 years old. You may need to be screened more often if early forms of precancerous polyps or small growths are found. Your health care provider may recommend screening at an earlier age if you have risk factors for colon cancer.  Your health care provider may recommend using home test kits to check for hidden blood in the stool.  A small camera at the end of a tube can be used to examine your colon (sigmoidoscopy or colonoscopy). This checks for the earliest forms of colorectal cancer.  Prostate and Testicular Cancer  Depending on your age and overall health, your health care provider may do certain tests to screen for prostate and testicular cancer.  Talk to your health care provider about any symptoms or concerns you have about testicular or prostate cancer.  Skin Cancer  Check your skin from head to toe regularly.  Tell your health care provider about any new moles or changes in moles, especially if: ? There is a change in a mole's size, shape, or color. ? You have a mole that is larger than a pencil eraser.  Always use sunscreen. Apply sunscreen liberally and repeat throughout the day.  Protect yourself by wearing long sleeves, pants, a wide-brimmed hat, and sunglasses when outside.  What should I know about heart disease, diabetes, and high blood pressure?  If you are 18-39 years of  age, have your blood pressure checked every 3-5 years. If you are 40 years of age or older, have your blood pressure checked every year. You should have your blood pressure measured twice-once when you are at a hospital or clinic, and once when you are not at a hospital or clinic. Record the average of the two measurements. To check your blood pressure when you are not at a hospital or clinic, you can use: ? An automated blood pressure machine at a pharmacy. ? A home blood pressure monitor.  Talk to your  health care provider about your target blood pressure.  If you are between 45-79 years old, ask your health care provider if you should take aspirin to prevent heart disease.  Have regular diabetes screenings by checking your fasting blood sugar level. ? If you are at a normal weight and have a low risk for diabetes, have this test once every three years after the age of 45. ? If you are overweight and have a high risk for diabetes, consider being tested at a younger age or more often.  A one-time screening for abdominal aortic aneurysm (AAA) by ultrasound is recommended for men aged 65-75 years who are current or former smokers. What should I know about preventing infection? Hepatitis B If you have a higher risk for hepatitis B, you should be screened for this virus. Talk with your health care provider to find out if you are at risk for hepatitis B infection. Hepatitis C Blood testing is recommended for:  Everyone born from 1945 through 1965.  Anyone with known risk factors for hepatitis C.  Sexually Transmitted Diseases (STDs)  You should be screened each year for STDs including gonorrhea and chlamydia if: ? You are sexually active and are younger than 61 years of age. ? You are older than 61 years of age and your health care provider tells you that you are at risk for this type of infection. ? Your sexual activity has changed since you were last screened and you are at an increased risk for chlamydia or gonorrhea. Ask your health care provider if you are at risk.  Talk with your health care provider about whether you are at high risk of being infected with HIV. Your health care provider may recommend a prescription medicine to help prevent HIV infection.  What else can I do?  Schedule regular health, dental, and eye exams.  Stay current with your vaccines (immunizations).  Do not use any tobacco products, such as cigarettes, chewing tobacco, and e-cigarettes. If you need help  quitting, ask your health care provider.  Limit alcohol intake to no more than 2 drinks per day. One drink equals 12 ounces of beer, 5 ounces of wine, or 1 ounces of hard liquor.  Do not use street drugs.  Do not share needles.  Ask your health care provider for help if you need support or information about quitting drugs.  Tell your health care provider if you often feel depressed.  Tell your health care provider if you have ever been abused or do not feel safe at home. This information is not intended to replace advice given to you by your health care provider. Make sure you discuss any questions you have with your health care provider. Document Released: 01/20/2008 Document Revised: 03/22/2016 Document Reviewed: 04/27/2015 Elsevier Interactive Patient Education  2018 Elsevier Inc.  

## 2017-08-13 NOTE — Progress Notes (Signed)
PCP notes:   Health maintenance:  Colon cancer screening - PCP please discuss Cologuard with pt per his request  Abnormal screenings:   Depression score: 6 Hearing -failed  Hearing Screening   125Hz  250Hz  500Hz  1000Hz  2000Hz  3000Hz  4000Hz  6000Hz  8000Hz   Right ear:   40 40 40  40    Left ear:   0 40 40  40     Patient concerns:   None  Nurse concerns:  None  Next PCP appt:   08/13/2017 @ 1500

## 2017-08-13 NOTE — Assessment & Plan Note (Signed)
Chronic, stable. Continue lipitor. Discussed aerobic exercise to improve readings.  The ASCVD Risk score Denman George(Goff DC Jr., et al., 2013) failed to calculate for the following reasons:   The valid total cholesterol range is 130 to 320 mg/dL

## 2017-08-13 NOTE — Progress Notes (Signed)
Pre visit review using our clinic review tool, if applicable. No additional management support is needed unless otherwise documented below in the visit note. 

## 2017-08-13 NOTE — Assessment & Plan Note (Signed)
Followed by pain clinic Laban Emperor(naveira)

## 2017-08-13 NOTE — Assessment & Plan Note (Signed)
Followed by pain management. 

## 2017-08-13 NOTE — Assessment & Plan Note (Signed)
Pending CPAP titration.  Pt hopeful to be able to better tolerate CPAP mask.

## 2017-08-13 NOTE — Assessment & Plan Note (Signed)
Discussed healthy diet and lifestyle changes to affect sustainable weight loss  

## 2017-08-13 NOTE — Assessment & Plan Note (Signed)
Testosterone levels have normalized off medication.

## 2017-08-13 NOTE — Patient Instructions (Signed)
Mark [name] , Thank you for taking time to come for your Medicare Wellness Visit. I appreciate your ongoing commitment to your health goals. Please review the following plan we discussed and let me know if I can assist you in the future.   These are the goals we discussed: Goals    . Follow up with Primary Care Provider     Starting 08/13/2017, I will continue to take medications as prescribed and to keep appointments with PCP as scheduled.        This is a list of the screening recommended for you and due dates:  Health Maintenance  Topic Date Due  . Colon Cancer Screening  08/06/2018*  . DTaP/Tdap/Td vaccine (2 - Td) 11/20/2019  . Tetanus Vaccine  11/20/2019  . Flu Shot  Completed  .  Hepatitis C: One time screening is recommended by Center for Disease Control  (CDC) for  adults born from [birth year] through 1965.   Completed  . HIV Screening  Completed  *Topic was postponed. The date shown is not the original due date.   Preventive Care for Adults  A healthy lifestyle and preventive care can promote health and wellness. Preventive health guidelines for adults include the following key practices.  . A routine yearly physical is a good way to check with your health care provider about your health and preventive screening. It is a chance to share any concerns and updates on your health and to receive a thorough exam.  . Visit your dentist for a routine exam and preventive care every 6 months. Brush your teeth twice a day and floss once a day. Good oral hygiene prevents tooth decay and gum disease.  . The frequency of eye exams is based on your age, health, family medical history, use  of contact lenses, and other factors. Follow your health care provider's recommendations for frequency of eye exams.  . Eat a healthy diet. Foods like vegetables, fruits, whole grains, low-fat dairy products, and lean protein foods contain the nutrients you need without too many calories. Decrease your intake of  foods high in solid fats, added sugars, and salt. Eat the right amount of calories for you. Get information about a proper diet from your health care provider, if necessary.  . Regular physical exercise is one of the most important things you can do for your health. Most adults should get at least 150 minutes of moderate-intensity exercise (any activity that increases your heart rate and causes you to sweat) each week. In addition, most adults need muscle-strengthening exercises on 2 or more days a week.  Silver Sneakers may be a benefit available to you. To determine eligibility, you may visit the website: www.silversneakers.com or contact program at (817)[phone] Mon-Fri between 8AM-8PM.   . Maintain a healthy weight. The body mass index (BMI) is a screening tool to identify possible weight problems. It provides an estimate of body fat based on height and weight. Your health care provider can find your BMI and can help you achieve or maintain a healthy weight.   For adults 20 years and older: ? A BMI below 18.5 is considered underweight. ? A BMI of 18.5 to 24.9 is normal. ? A BMI of 25 to 29.9 is considered overweight. ? A BMI of 30 and above is considered obese.   . Maintain normal blood lipids and cholesterol levels by exercising and minimizing your intake of saturated fat. Eat a balanced diet with plenty of fruit and vegetables. Blood tests for  lipids and cholesterol should begin at age 58 and be repeated every 5 years. If your lipid or cholesterol levels are high, you are over 50, or you are at high risk for heart disease, you may need your cholesterol levels checked more frequently. Ongoing high lipid and cholesterol levels should be treated with medicines if diet and exercise are not working.  . If you smoke, find out from your health care provider how to quit. If you do not use tobacco, please do not start.  . If you choose to drink alcohol, please do not consume more than 2 drinks per  day. One drink is considered to be 12 ounces (355 mL) of beer, 5 ounces (148 mL) of wine, or 1.5 ounces (44 mL) of liquor.  . If you are 74-47 years old, ask your health care provider if you should take aspirin to prevent strokes.  . Use sunscreen. Apply sunscreen liberally and repeatedly throughout the day. You should seek shade when your shadow is shorter than you. Protect yourself by wearing long sleeves, pants, a wide-brimmed hat, and sunglasses year round, whenever you are outdoors.  . Once a month, do a whole body skin exam, using a mirror to look at the skin on your back. Tell your health care provider of new moles, moles that have irregular borders, moles that are larger than a pencil eraser, or moles that have changed in shape or color.

## 2017-08-13 NOTE — Assessment & Plan Note (Addendum)
Advanced directive discussion - spoke with Virl AxeLesia - received advanced directive packet again. HCPOA would be [name] (lifelong friend).

## 2017-08-13 NOTE — Assessment & Plan Note (Signed)
Preventative protocols reviewed and updated unless pt declined. Discussed healthy diet and lifestyle.  

## 2017-08-13 NOTE — Assessment & Plan Note (Signed)
Stable period longterm on prozac, prior on klonopin.

## 2017-08-14 ENCOUNTER — Ambulatory Visit: Payer: Medicare Other | Attending: Nurse Practitioner | Admitting: Nurse Practitioner

## 2017-08-14 ENCOUNTER — Encounter: Payer: Self-pay | Admitting: Nurse Practitioner

## 2017-08-14 ENCOUNTER — Other Ambulatory Visit: Payer: Self-pay

## 2017-08-14 VITALS — BP 144/76 | Temp 97.6°F | Resp 16 | Ht 67.0 in | Wt 225.0 lb

## 2017-08-14 DIAGNOSIS — G4733 Obstructive sleep apnea (adult) (pediatric): Secondary | ICD-10-CM | POA: Insufficient documentation

## 2017-08-14 DIAGNOSIS — G894 Chronic pain syndrome: Secondary | ICD-10-CM | POA: Diagnosis not present

## 2017-08-14 DIAGNOSIS — Z950 Presence of cardiac pacemaker: Secondary | ICD-10-CM | POA: Insufficient documentation

## 2017-08-14 DIAGNOSIS — M170 Bilateral primary osteoarthritis of knee: Secondary | ICD-10-CM | POA: Diagnosis not present

## 2017-08-14 DIAGNOSIS — Z880 Allergy status to penicillin: Secondary | ICD-10-CM | POA: Insufficient documentation

## 2017-08-14 DIAGNOSIS — F329 Major depressive disorder, single episode, unspecified: Secondary | ICD-10-CM | POA: Diagnosis not present

## 2017-08-14 DIAGNOSIS — F411 Generalized anxiety disorder: Secondary | ICD-10-CM | POA: Diagnosis not present

## 2017-08-14 DIAGNOSIS — Z7982 Long term (current) use of aspirin: Secondary | ICD-10-CM | POA: Insufficient documentation

## 2017-08-14 DIAGNOSIS — R82991 Hypocitraturia: Secondary | ICD-10-CM | POA: Diagnosis not present

## 2017-08-14 DIAGNOSIS — N400 Enlarged prostate without lower urinary tract symptoms: Secondary | ICD-10-CM | POA: Diagnosis not present

## 2017-08-14 DIAGNOSIS — M542 Cervicalgia: Secondary | ICD-10-CM | POA: Diagnosis not present

## 2017-08-14 DIAGNOSIS — Z79899 Other long term (current) drug therapy: Secondary | ICD-10-CM | POA: Diagnosis not present

## 2017-08-14 DIAGNOSIS — G8929 Other chronic pain: Secondary | ICD-10-CM

## 2017-08-14 DIAGNOSIS — M5412 Radiculopathy, cervical region: Secondary | ICD-10-CM | POA: Diagnosis not present

## 2017-08-14 DIAGNOSIS — M25511 Pain in right shoulder: Secondary | ICD-10-CM | POA: Diagnosis not present

## 2017-08-14 DIAGNOSIS — Z885 Allergy status to narcotic agent status: Secondary | ICD-10-CM | POA: Insufficient documentation

## 2017-08-14 DIAGNOSIS — R51 Headache: Secondary | ICD-10-CM | POA: Insufficient documentation

## 2017-08-14 DIAGNOSIS — M533 Sacrococcygeal disorders, not elsewhere classified: Secondary | ICD-10-CM | POA: Insufficient documentation

## 2017-08-14 DIAGNOSIS — N433 Hydrocele, unspecified: Secondary | ICD-10-CM | POA: Insufficient documentation

## 2017-08-14 DIAGNOSIS — E785 Hyperlipidemia, unspecified: Secondary | ICD-10-CM | POA: Insufficient documentation

## 2017-08-14 DIAGNOSIS — Z79891 Long term (current) use of opiate analgesic: Secondary | ICD-10-CM | POA: Insufficient documentation

## 2017-08-14 DIAGNOSIS — M48061 Spinal stenosis, lumbar region without neurogenic claudication: Secondary | ICD-10-CM | POA: Diagnosis not present

## 2017-08-14 DIAGNOSIS — M25512 Pain in left shoulder: Secondary | ICD-10-CM

## 2017-08-14 DIAGNOSIS — M4802 Spinal stenosis, cervical region: Secondary | ICD-10-CM | POA: Insufficient documentation

## 2017-08-14 DIAGNOSIS — Z8261 Family history of arthritis: Secondary | ICD-10-CM | POA: Insufficient documentation

## 2017-08-14 DIAGNOSIS — Z881 Allergy status to other antibiotic agents status: Secondary | ICD-10-CM | POA: Insufficient documentation

## 2017-08-14 DIAGNOSIS — G4700 Insomnia, unspecified: Secondary | ICD-10-CM | POA: Diagnosis not present

## 2017-08-14 DIAGNOSIS — Z9889 Other specified postprocedural states: Secondary | ICD-10-CM | POA: Insufficient documentation

## 2017-08-14 DIAGNOSIS — Z87442 Personal history of urinary calculi: Secondary | ICD-10-CM | POA: Insufficient documentation

## 2017-08-14 DIAGNOSIS — Z8249 Family history of ischemic heart disease and other diseases of the circulatory system: Secondary | ICD-10-CM | POA: Insufficient documentation

## 2017-08-14 DIAGNOSIS — Z809 Family history of malignant neoplasm, unspecified: Secondary | ICD-10-CM | POA: Insufficient documentation

## 2017-08-14 DIAGNOSIS — Z833 Family history of diabetes mellitus: Secondary | ICD-10-CM | POA: Insufficient documentation

## 2017-08-14 DIAGNOSIS — I495 Sick sinus syndrome: Secondary | ICD-10-CM | POA: Diagnosis not present

## 2017-08-14 MED ORDER — OXYCODONE HCL 5 MG PO TABS: 5.0000 mg | ORAL_TABLET | Freq: Four times a day (QID) | ORAL | 0 refills | Status: DC | PRN

## 2017-08-14 MED ORDER — OXYCODONE HCL ER 10 MG PO T12A: 10.0000 mg | EXTENDED_RELEASE_TABLET | Freq: Two times a day (BID) | ORAL | 0 refills | Status: DC

## 2017-08-14 MED ORDER — OXYCODONE HCL 5 MG PO TABS
5.0000 mg | ORAL_TABLET | Freq: Four times a day (QID) | ORAL | 0 refills | Status: DC | PRN
Start: 2017-09-21 — End: 2017-08-29

## 2017-08-14 NOTE — Progress Notes (Signed)
Nursing Pain Medication Assessment:  Safety precautions to be maintained throughout the outpatient stay will include: orient to surroundings, keep bed in low position, maintain call bell within reach at all times, provide assistance with transfer out of bed and ambulation.  Medication Inspection Compliance: Pill count conducted under aseptic conditions, in front of the patient. Neither the pills nor the bottle was removed from the patient's sight at any time. Once count was completed pills were immediately returned to the patient in their original bottle.  Medication #1: Oxycodone 10mg  Pill/Patch Count: 37 of 60 pills remain Pill/Patch Appearance: Markings consistent with prescribed medication Bottle Appearance: Standard pharmacy container. Clearly labeled. Filled Date: 3112 / 17 / 2018 Last Medication intake:  Today  Medication #2: oxycodone 5mg  Pill/Patch Count: 34 of 120 pills remain Pill/Patch Appearance: Markings consistent with prescribed medication Bottle Appearance: Standard pharmacy container. Clearly labeled. Filled Date: 8812 / 17 / 2018 Last Medication intake:  Yesterday

## 2017-08-14 NOTE — Patient Instructions (Addendum)
____________________________________________________________________________________________  Medication Rules  Applies to: All patients receiving prescriptions (written or electronic).  Pharmacy of record: Pharmacy where electronic prescriptions will be sent. If written prescriptions are taken to a different pharmacy, please inform the nursing staff. The pharmacy listed in the electronic medical record should be the one where you would like electronic prescriptions to be sent.  Prescription refills: Only during scheduled appointments. Applies to both, written and electronic prescriptions.  NOTE: The following applies primarily to controlled substances (Opioid* Pain Medications).   Patient's responsibilities: 1. Pain Pills: Bring all pain pills to every appointment (except for procedure appointments). 2. Pill Bottles: Bring pills in original pharmacy bottle. Always bring newest bottle. Bring bottle, even if empty. 3. Medication refills: You are responsible for knowing and keeping track of what medications you need refilled. The day before your appointment, write a list of all prescriptions that need to be refilled. Bring that list to your appointment and give it to the admitting nurse. Prescriptions will be written only during appointments. If you forget a medication, it will not be "Called in", "Faxed", or "electronically sent". You will need to get another appointment to get these prescribed. 4. Prescription Accuracy: You are responsible for carefully inspecting your prescriptions before leaving our office. Have the discharge nurse carefully go over each prescription with you, before taking them home. Make sure that your name is accurately spelled, that your address is correct. Check the name and dose of your medication to make sure it is accurate. Check the number of pills, and the written instructions to make sure they are clear and accurate. Make sure that you are given enough medication to  last until your next medication refill appointment. 5. Taking Medication: Take medication as prescribed. Never take more pills than instructed. Never take medication more frequently than prescribed. Taking less pills or less frequently is permitted and encouraged, when it comes to controlled substances (written prescriptions).  6. Inform other Doctors: Always inform, all of your healthcare providers, of all the medications you take. 7. Pain Medication from other Providers: You are not allowed to accept any additional pain medication from any other Doctor or Healthcare provider. There are two exceptions to this rule. (see below) In the event that you require additional pain medication, you are responsible for notifying us, as stated below. 8. Medication Agreement: You are responsible for carefully reading and following our Medication Agreement. This must be signed before receiving any prescriptions from our practice. Safely store a copy of your signed Agreement. Violations to the Agreement will result in no further prescriptions. (Additional copies of our Medication Agreement are available upon request.) 9. Laws, Rules, & Regulations: All patients are expected to follow all Federal and State Laws, Statutes, Rules, & Regulations. Ignorance of the Laws does not constitute a valid excuse. The use of any illegal substances is prohibited. 10. Adopted CDC guidelines & recommendations: Target dosing levels will be at or below 60 MME/day. Use of benzodiazepines** is not recommended.  Exceptions: There are only two exceptions to the rule of not receiving pain medications from other Healthcare Providers. 1. Exception #1 (Emergencies): In the event of an emergency (i.e.: accident requiring emergency care), you are allowed to receive additional pain medication. However, you are responsible for: As soon as you are able, call our office [phone], at any time of the day or night, and leave a message stating your  name, the date and nature of the emergency, and the name and dose of the medication   prescribed. In the event that your call is answered by a member of our staff, make sure to document and save the date, time, and the name of the person that took your information.  2. Exception #2 (Planned Surgery): In the event that you are scheduled by another doctor or dentist to have any type of surgery or procedure, you are allowed (for a period no longer than 30 days), to receive additional pain medication, for the acute post-op pain. However, in this case, you are responsible for picking up a copy of our "Post-op Pain Management for Surgeons" handout, and giving it to your surgeon or dentist. This document is available at our office, and does not require an appointment to obtain it. Simply go to our office during business hours (Monday-Thursday from 8:00 AM to 4:00 PM) (Friday 8:00 AM to 12:00 Noon) or if you have a scheduled appointment with us, prior to your surgery, and ask for it by name. In addition, you will need to provide us with your name, name of your surgeon, type of surgery, and date of procedure or surgery.  *Opioid medications include: morphine, codeine, oxycodone, oxymorphone, hydrocodone, hydromorphone, meperidine, tramadol, tapentadol, buprenorphine, fentanyl, methadone. **Benzodiazepine medications include: diazepam (Valium), alprazolam (Xanax), clonazepam (Klonopine), lorazepam (Ativan), clorazepate (Tranxene), chlordiazepoxide (Librium), estazolam (Prosom), oxazepam (Serax), temazepam (Restoril), triazolam (Halcion)  ____________________________________________________________________________________________   BMI Assessment: Estimated body mass index is 35.24 kg/m as calculated from the following:   Height as of this encounter: 5\' 7"  (1.702 m).   Weight as of this encounter: 225 lb (102.1 kg).  BMI interpretation table: BMI level Category Range association with higher incidence of chronic  pain  <18 kg/m2 Underweight   18.5-24.9 kg/m2 Ideal body weight   25-29.9 kg/m2 Overweight Increased incidence by 20%  30-34.9 kg/m2 Obese (Class I) Increased incidence by 68%  35-39.9 kg/m2 Severe obesity (Class II) Increased incidence by 136%  >40 kg/m2 Extreme obesity (Class III) Increased incidence by 254%   BMI Readings from Last 4 Encounters:  08/14/17 35.24 kg/m  08/13/17 35.24 kg/m  08/13/17 35.24 kg/m  06/14/17 35.08 kg/m   Wt Readings from Last 4 Encounters:  08/14/17 225 lb (102.1 kg)  08/13/17 225 lb (102.1 kg)  08/13/17 225 lb (102.1 kg)  06/14/17 224 lb (101.6 kg)

## 2017-08-14 NOTE — Progress Notes (Deleted)
Nursing Pain Medication Assessment:  Safety precautions to be maintained throughout the outpatient stay will include: orient to surroundings, keep bed in low position, maintain call bell within reach at all times, provide assistance with transfer out of bed and ambulation.  Medication Inspection Compliance: Pill count conducted under aseptic conditions, in front of the patient. Neither the pills nor the bottle was removed from the patient's sight at any time. Once count was completed pills were immediately returned to the patient in their original bottle.  Medication: oxycodone 10mg  Pill/Patch Count: 37 of 60 pills remain Pill/Patch Appearance: Markings consistent with prescribed medication Bottle Appearance: Standard pharmacy container. Clearly labeled. Filled Date: 3812 / 17 / 2018 Last Medication intake:  Today

## 2017-08-14 NOTE — Progress Notes (Signed)
Patient's Name: Mark [name]  MRN: [id number]  Referring Provider: Ria Bush, MD  DOB: 02-15-1956  PCP: Ria Bush, MD  DOS: 08/14/2017  Note by: Vevelyn Francois NP  Service setting: Ambulatory outpatient  Specialty: Interventional Pain Management  Location: [facility] (AMB) Pain Management Facility    Patient type: Established    Primary Reason(s) for Visit: Encounter for prescription drug management. (Level of risk: moderate)  CC: Neck Pain (mid)  HPI  Mr. Boehlke is a 62 y.o. year old, male patient, who comes today for a medication management evaluation. He has Chronic pain syndrome; MDD (major depressive disorder), recurrent episode, moderate ([place]); Insomnia; GAD (generalized anxiety disorder); Medicare annual wellness visit, subsequent; Hyperlipidemia; Severe obesity (BMI 35.0-39.9) with comorbidity ([place]); Health maintenance examination; Candidal dermatitis; Benign prostatic hyperplasia with urinary obstruction; Nephrolithiasis; Bradycardia; Cardiac murmur; Opiate use (60 MME/Day); Encounter for therapeutic drug level monitoring; Chronic neck pain (Location of Primary Source of Pain) (Bilateral) (L>R); Chronic shoulder pain (Bilateral) (L>R); Chronic knee pain (Bilateral) (R>L); History of permanent cardiac pacemaker placement; Opioid-induced constipation (OIC); Radicular pain of shoulder (Bilateral) (L>R); 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); Cervicogenic headache; Cervical spondylosis with radiculopathy (Bilateral) (L>R); Cervical facet syndrome (Bilateral) (L>R); Cervical foraminal stenosis (Severe C6-7) (Left); Chronic sacroiliac joint pain (Bilateral); Osteoarthritis of sacroiliac joint (Bilateral); Lumbar facet hypertrophy (L1-2, L2-3, and L4-5) (Bilateral); Lumbar IVDD (intervertebral disc displacement); 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); Lumbar facet syndrome (Bilateral); Osteoarthritis of shoulder (Left); Osteoarthritis of knee (Bilateral) (R>L);  Obstructive sleep apnea; Hypocitraturia; Long term current use of opiate analgesic; Chronic cervical radicular pain; Advanced care planning/counseling discussion; Chronic fatigue; and Low testosterone in male on their problem list. His primarily concern today is the Neck Pain (mid)  Pain Assessment: Location:   Neck Radiating: shoulders bilateral but radiates down left arm to hand/fingers Onset: More than a month ago Duration: Chronic pain Quality: Aching, Constant, Numbness Severity: 3 /10 (self-reported pain score)  Note: Reported level is compatible with observation.                          Effect on ADL: housework. sleep, hand gets numb Timing: Constant Modifying factors: medications, rest  Mr. Napoli was last scheduled for an appointment on 05/09/2017 for medication management. During today's appointment we reviewed Mr. Carreira's chronic pain status, as well as his outpatient medication regimen. He states his pain is stable. He states that being off clonazepam and decreasing the amount of Prozac has been beneficial for his overall mental health and testesterone.   The patient  reports that he does not use drugs. His body mass index is 35.24 kg/m.  Further details on both, my assessment(s), as well as the proposed treatment plan, please see below.  Controlled Substance Pharmacotherapy Assessment REMS (Risk Evaluation and Mitigation Strategy)  Analgesic:Oxycodone ER 10 mg every 12 hours (20 mg/day) + oxycodone IR 5 mg every 6 hours (20 mg/day) MME/day:60 mg/day.    Ignatius Specking, RN  08/14/2017  1:31 PM  Sign at close encounter Nursing Pain Medication Assessment:  Safety precautions to be maintained throughout the outpatient stay will include: orient to surroundings, keep bed in low position, maintain call bell within reach at all times, provide assistance with transfer out of bed and ambulation.  Medication Inspection Compliance: Pill count conducted under aseptic conditions, in  front of the patient. Neither the pills nor the bottle was removed from the patient's sight at any time. Once count  was completed pills were immediately returned to the patient in their original bottle.  Medication #1: Oxycodone '10mg'$  Pill/Patch Count: 37 of 60 pills remain Pill/Patch Appearance: Markings consistent with prescribed medication Bottle Appearance: Standard pharmacy container. Clearly labeled. Filled Date: 41 / 17 / 2018 Last Medication intake:  Today  Medication #2: oxycodone '5mg'$  Pill/Patch Count: 34 of 120 pills remain Pill/Patch Appearance: Markings consistent with prescribed medication Bottle Appearance: Standard pharmacy container. Clearly labeled. Filled Date: 33 / 17 / 2018 Last Medication intake:  Yesterday   Pharmacokinetics: Liberation and absorption (onset of action): WNL Distribution (time to peak effect): WNL Metabolism and excretion (duration of action): WNL         Pharmacodynamics: Desired effects: Analgesia: Mr. Jump reports >50% benefit. Functional ability: Patient reports that medication allows him to accomplish basic ADLs Clinically meaningful improvement in function (CMIF): Sustained CMIF goals met Perceived effectiveness: Described as relatively effective, allowing for increase in activities of daily living (ADL) Undesirable effects: Side-effects or Adverse reactions: None reported Monitoring: [place] PMP: Online review of the past 58-monthperiod conducted. Compliant with practice rules and regulations Last UDS on record: Summary  Date Value Ref Range Status  02/06/2017 FINAL  Final    Comment:    ==================================================================== TOXASSURE SELECT 13 (MW) ==================================================================== Test                             Result       Flag       Units Drug Present and Declared for Prescription Verification   7-aminoclonazepam              317          EXPECTED   ng/mg creat     7-aminoclonazepam is an expected metabolite of clonazepam. Source    of clonazepam is a scheduled prescription medication.   Oxycodone                      1279         EXPECTED   ng/mg creat   Noroxycodone                   2043         EXPECTED   ng/mg creat    Sources of oxycodone include scheduled prescription medications.    Noroxycodone is an expected metabolite of oxycodone. ==================================================================== Test                      Result    Flag   Units      Ref Range   Creatinine              47               mg/dL      >=20 ==================================================================== Declared Medications:  The flagging and interpretation on this report are based on the  following declared medications.  Unexpected results may arise from  inaccuracies in the declared medications.  **Note: The testing scope of this panel includes these medications:  Clonazepam (Klonopin)  Oxycodone (OxyContin)  Oxycodone (Roxicodone)  **Note: The testing scope of this panel does not include following  reported medications:  Aspirin (Aspirin 81)  Atorvastatin (Lipitor)  Cetirizine (Zyrtec)  Cholecalciferol  Clindamycin  Fluoxetine  Fluticasone  Furosemide (Lasix)  Multivitamin  Naloxone (Narcan)  Naproxen (Naprosyn)  Omega-3 Fatty Acids (Fish Oil)  Omeprazole (Prilosec)  Polyethylene Glycol (  GlycoLAX)  Potassium Citrate (Urocit-K)  Sildenafil (Revatio)  Supplement  Supplement (Saw Palmetto)  Topical  Vitamin B  Vitamin C  Vitamin E ==================================================================== For clinical consultation, please call [phone]. ====================================================================    UDS interpretation: Compliant          Medication Assessment Form: Reviewed. Patient indicates being compliant with therapy Treatment compliance: Compliant Risk Assessment Profile: Aberrant behavior: See prior  evaluations. None observed or detected today Comorbid factors increasing risk of overdose: See prior notes. No additional risks detected today Risk of substance use disorder (SUD): Low Opioid Risk Tool - 08/14/17 1309      Family History of Substance Abuse   Alcohol  Negative    Illegal Drugs  Negative    Rx Drugs  Negative      Personal History of Substance Abuse   Alcohol  Negative    Illegal Drugs  Negative    Rx Drugs  Negative      Psychological Disease   Psychological Disease  Negative    Depression  Negative      Total Score   Opioid Risk Tool Scoring  0    Opioid Risk Interpretation  Low Risk      ORT Scoring interpretation table:  Score <3 = Low Risk for SUD  Score between 4-7 = Moderate Risk for SUD  Score >8 = High Risk for Opioid Abuse   Risk Mitigation Strategies:  Patient Counseling: Covered Patient-Prescriber Agreement (PPA): Present and active  Notification to other healthcare providers: Done  Pharmacologic Plan: No change in therapy, at this time.             Laboratory Chemistry  Inflammation Markers (CRP: Acute Phase) (ESR: Chronic Phase) Lab Results  Component Value Date   CRP 1.7 (H) 09/01/2015   ESRSEDRATE 6 09/01/2015                 Rheumatology Markers No results found for: Elayne Guerin, [facility]              Renal Function Markers Lab Results  Component Value Date   BUN 18 08/08/2017   CREATININE 1.03 08/08/2017   GFRAA >60 09/01/2015   GFRNONAA >60 09/01/2015                 Hepatic Function Markers Lab Results  Component Value Date   AST 23 08/08/2017   ALT 25 08/08/2017   ALBUMIN 4.0 08/08/2017   ALKPHOS 54 08/08/2017   HCVAB NEGATIVE 07/22/2013                 Electrolytes Lab Results  Component Value Date   NA 139 08/08/2017   K 3.8 08/08/2017   CL 103 08/08/2017   CALCIUM 8.9 08/08/2017   MG 2.1 09/01/2015                 Neuropathy Markers Lab Results  Component Value Date    VITAMINB12 861 02/02/2016   HGBA1C 5.5 08/08/2017                 Bone Pathology Markers Lab Results  Component Value Date   25OHVITD1 63 02/02/2016   25OHVITD2 <1.0 02/02/2016   25OHVITD3 63 02/02/2016   TESTOSTERONE 216.29 (L) 02/13/2017                 Coagulation Parameters Lab Results  Component Value Date   INR 1.0 06/03/2007   LABPROT 13.7 06/03/2007   APTT 29 06/03/2007  PLT 186.0 08/08/2017                 Cardiovascular Markers Lab Results  Component Value Date   HGB 15.2 08/08/2017   HCT 43.7 08/08/2017                 CA Markers No results found for: CEA, CA125, LABCA2               Note: Lab results reviewed.  Recent Diagnostic Imaging Results  DG C-Arm 1-60 Min-No Report Fluoroscopy was utilized by the requesting physician.  No radiographic  interpretation.   Complexity Note: Imaging results reviewed. Results shared with Mr. Chaput, using Layman's terms.                         Meds   Current Outpatient Medications:  .  aspirin 81 MG tablet, Take 81 mg by mouth daily.  , Disp: , Rfl:  .  atorvastatin (LIPITOR) 20 MG tablet, Take 1 tablet (20 mg total) by mouth daily., Disp: 90 tablet, Rfl: 0 .  cetirizine (ZYRTEC) 10 MG tablet, Take 10 mg by mouth daily. As needed, Disp: , Rfl:  .  Cholecalciferol (VITAMIN D) 2000 units CAPS, Take 1 capsule by mouth daily., Disp: , Rfl:  .  clindamycin (CLEOCIN) 150 MG capsule, Take 150 mg by mouth as needed. 4 caps 1 hour before dental procedures. , Disp: , Rfl:  .  CRANBERRY PO, Take 4,200 mg by mouth daily. , Disp: , Rfl:  .  desonide (DESOWEN) 0.05 % ointment, Apply topically 2 (two) times daily. (Patient taking differently: Apply topically as needed. ), Disp: 60 g, Rfl: 1 .  FLUoxetine (PROZAC) 40 MG capsule, TAKE 1 BY MOUTH DAILY, Disp: 90 capsule, Rfl: 1 .  fluticasone (FLONASE) 50 MCG/ACT nasal spray, Place 2 sprays into both nostrils daily., Disp: 48 g, Rfl: 1 .  furosemide (LASIX) 20 MG tablet, TAKE 1 BY  MOUTH DAILY, Disp: 90 tablet, Rfl: 1 .  Garlic 8250 MG CAPS, Take 1 capsule by mouth daily.  , Disp: , Rfl:  .  Multiple Vitamin (MULTIVITAMIN PO), Take 1 tablet by mouth daily.  , Disp: , Rfl:  .  Naloxone HCl (NARCAN) 4 MG/0.1ML LIQD, Spray into one nostril. Repeat with second device into other nostril after 2-3 minutes if no or minimal response., Disp: 2 each, Rfl: 0 .  naproxen (NAPROSYN) 500 MG tablet, TAKE 1 BY MOUTH TWICE DAILY WITH A MEAL, Disp: 180 tablet, Rfl: 1 .  Omega-3 Fatty Acids (FISH OIL) 1200 MG CAPS, Take 1,200 mg by mouth daily. , Disp: , Rfl:  .  omeprazole (PRILOSEC) 20 MG capsule, TAKE 1 BY MOUTH DAILY, Disp: 90 capsule, Rfl: 0 .  [START ON 10/21/2017] oxyCODONE (OXY IR/ROXICODONE) 5 MG immediate release tablet, Take 1 tablet (5 mg total) by mouth every 6 (six) hours as needed for severe pain., Disp: 120 tablet, Rfl: 0 .  polyethylene glycol powder (MIRALAX) powder, Take 1 Container by mouth as needed. , Disp: , Rfl:  .  potassium citrate (UROCIT-K) 10 MEQ (1080 MG) SR tablet, Take 1 tablet (10 mEq total) by mouth 2 (two) times daily., Disp: 180 tablet, Rfl: 0 .  Saw Palmetto, Serenoa repens, (SAW PALMETTO PO), Take 450 mg by mouth daily. , Disp: , Rfl:  .  sildenafil (REVATIO) 20 MG tablet, 3-5 tablets daily as needed, Disp: , Rfl:  .  VITAMIN B COMPLEX-C CAPS, Take by  mouth daily. , Disp: , Rfl:  .  vitamin E 1000 UNIT capsule, , Disp: , Rfl:  .  [START ON 09/21/2017] oxyCODONE (OXY IR/ROXICODONE) 5 MG immediate release tablet, Take 1 tablet (5 mg total) by mouth every 6 (six) hours as needed for severe pain., Disp: 120 tablet, Rfl: 0 .  [START ON 08/22/2017] oxyCODONE (OXY IR/ROXICODONE) 5 MG immediate release tablet, Take 1 tablet (5 mg total) by mouth every 6 (six) hours as needed for severe pain., Disp: 120 tablet, Rfl: 0 .  [START ON 10/21/2017] oxyCODONE (OXYCONTIN) 10 mg 12 hr tablet, Take 1 tablet (10 mg total) by mouth every 12 (twelve) hours., Disp: 60 tablet, Rfl: 0 .   [START ON 09/21/2017] oxyCODONE (OXYCONTIN) 10 mg 12 hr tablet, Take 1 tablet (10 mg total) by mouth every 12 (twelve) hours., Disp: 60 tablet, Rfl: 0 .  [START ON 08/22/2017] oxyCODONE (OXYCONTIN) 10 mg 12 hr tablet, Take 1 tablet (10 mg total) by mouth every 12 (twelve) hours., Disp: 60 tablet, Rfl: 0  ROS  Constitutional: Denies any fever or chills Gastrointestinal: No reported hemesis, hematochezia, vomiting, or acute GI distress Musculoskeletal: Denies any acute onset joint swelling, redness, loss of ROM, or weakness Neurological: No reported episodes of acute onset apraxia, aphasia, dysarthria, agnosia, amnesia, paralysis, loss of coordination, or loss of consciousness  Allergies  Mr. Denardo is allergic to ciprofloxacin; metformin; penicillin v potassium; penicillins; buprenorphine hcl; and morphine and related.  [place]  Drug: Mr. Norden  reports that he does not use drugs. Alcohol:  reports that he does not drink alcohol. Tobacco:  reports that  has never smoked. he has never used smokeless tobacco. Medical:  has a past medical history of Anxiety, Chronic pain, Depression, History of kidney stones (April 12,2017), History of permanent cardiac pacemaker placement (05/31/2015), Hydrocele in adult (05/07/2015), Kidney stones, Migraine, Prostatitis, Sleep apnea, and Sleep apnea. Surgical: Mr. Limas  has a past surgical history that includes Tonsillectomy (1964); Carpal tunnel release (Bilateral, 2006); Ulnar nerve repair (2008); Cervical spine surgery (2008); Rotator cuff repair (Left, 2009); pacemaker placement (2009); Lithotripsy (Bilateral, 2012); Lithotripsy (Left, 11/2015); Cardiovascular stress test (2014); and Colonoscopy (07/2007). Family: family history includes Arthritis in his father and mother; Cancer in his father and mother; Diabetes in his mother; Heart block in his father; Heart disease in his father and mother.  Constitutional Exam  General appearance: Well nourished, well  developed, and well hydrated. In no apparent acute distress Vitals:   08/14/17 1258 08/14/17 1301  BP:  (!) 144/76  Resp: 16   Temp: 97.6 F (36.4 C)   TempSrc: Oral   SpO2: 98%   Weight: 225 lb (102.1 kg)   Height: 5' 7"  (1.702 m)    BMI Assessment: Estimated body mass index is 35.24 kg/m as calculated from the following:   Height as of this encounter: 5' 7"  (1.702 m).   Weight as of this encounter: 225 lb (102.1 kg). Psych/Mental status: Alert, oriented x 3 (person, place, & time)       Eyes: PERLA Respiratory: No evidence of acute respiratory distress  Cervical Spine Area Exam  Skin & Axial Inspection: No masses, redness, edema, swelling, or associated skin lesions Alignment: Symmetrical Functional ROM: Unrestricted ROM      Stability: No instability detected Muscle Tone/Strength: Functionally intact. No obvious neuro-muscular anomalies detected. Sensory (Neurological): Unimpaired Palpation: No palpable anomalies              Upper Extremity (UE) Exam  Side: Right upper extremity  Side: Left upper extremity  Skin & Extremity Inspection: Skin color, temperature, and hair growth are WNL. No peripheral edema or cyanosis. No masses, redness, swelling, asymmetry, or associated skin lesions. No contractures.  Skin & Extremity Inspection: Skin color, temperature, and hair growth are WNL. No peripheral edema or cyanosis. No masses, redness, swelling, asymmetry, or associated skin lesions. No contractures.  Functional ROM: Unrestricted ROM          Functional ROM: Unrestricted ROM          Muscle Tone/Strength: Functionally intact. No obvious neuro-muscular anomalies detected.  Muscle Tone/Strength: Functionally intact. No obvious neuro-muscular anomalies detected.  Sensory (Neurological): Unimpaired          Sensory (Neurological): Unimpaired          Palpation: No palpable anomalies              Palpation: No palpable anomalies              Specialized Test(s): Deferred          Specialized Test(s): Deferred          Thoracic Spine Area Exam  Skin & Axial Inspection: No masses, redness, or swelling Alignment: Symmetrical Functional ROM: Unrestricted ROM Stability: No instability detected Muscle Tone/Strength: Functionally intact. No obvious neuro-muscular anomalies detected. Sensory (Neurological): Unimpaired Muscle strength & Tone: No palpable anomalies  Lumbar Spine Area Exam  Skin & Axial Inspection: No masses, redness, or swelling Alignment: Symmetrical Functional ROM: Unrestricted ROM      Stability: No instability detected Muscle Tone/Strength: Functionally intact. No obvious neuro-muscular anomalies detected. Sensory (Neurological): Unimpaired Palpation: No palpable anomalies       Provocative Tests: Lumbar Hyperextension and rotation test: evaluation deferred today       Lumbar Lateral bending test: evaluation deferred today       Patrick's Maneuver: evaluation deferred today                    Gait & Posture Assessment  Ambulation: Unassisted Gait: Relatively normal for age and body habitus Posture: WNL   Lower Extremity Exam    Side: Right lower extremity  Side: Left lower extremity  Skin & Extremity Inspection: Skin color, temperature, and hair growth are WNL. No peripheral edema or cyanosis. No masses, redness, swelling, asymmetry, or associated skin lesions. No contractures.  Skin & Extremity Inspection: Skin color, temperature, and hair growth are WNL. No peripheral edema or cyanosis. No masses, redness, swelling, asymmetry, or associated skin lesions. No contractures.  Functional ROM: Unrestricted ROM          Functional ROM: Unrestricted ROM          Muscle Tone/Strength: Functionally intact. No obvious neuro-muscular anomalies detected.  Muscle Tone/Strength: Functionally intact. No obvious neuro-muscular anomalies detected.  Sensory (Neurological): Unimpaired  Sensory (Neurological): Unimpaired  Palpation: No palpable anomalies   Palpation: No palpable anomalies   Assessment  Primary Diagnosis & Pertinent Problem List: The primary encounter diagnosis was Chronic neck pain (Location of Primary Source of Pain) (Bilateral) (L>R). Diagnoses of Chronic pain of both shoulders, Chronic sacroiliac joint pain (Bilateral), and Chronic pain syndrome were also pertinent to this visit.  Status Diagnosis  Controlled Controlled Controlled 1. Chronic neck pain (Location of Primary Source of Pain) (Bilateral) (L>R)   2. Chronic pain of both shoulders   3. Chronic sacroiliac joint pain (Bilateral)   4. Chronic pain syndrome     Problems updated and reviewed during this  visit: No problems updated. Plan of Care  Pharmacotherapy (Medications Ordered): Meds ordered this encounter  Medications  . oxyCODONE (OXY IR/ROXICODONE) 5 MG immediate release tablet    Sig: Take 1 tablet (5 mg total) by mouth every 6 (six) hours as needed for severe pain.    Dispense:  120 tablet    Refill:  0    Do not add this medication to the electronic "Automatic Refill" notification system. Patient may have prescription filled one day early if pharmacy is closed on scheduled refill date. Do not fill until: 10/21/2017 To last until: 11/20/2017    Order Specific Question:   Supervising Provider    Answer:   Milinda Pointer [phone]  . oxyCODONE (OXYCONTIN) 10 mg 12 hr tablet    Sig: Take 1 tablet (10 mg total) by mouth every 12 (twelve) hours.    Dispense:  60 tablet    Refill:  0    Do not add this medication to the electronic "Automatic Refill" notification system. Patient may have prescription filled one day early if pharmacy is closed on scheduled refill date. Do not fill until: 10/21/2017 To last until: 11/20/2017    Order Specific Question:   Supervising Provider    Answer:   Milinda Pointer [phone]  . oxyCODONE (OXY IR/ROXICODONE) 5 MG immediate release tablet    Sig: Take 1 tablet (5 mg total) by mouth every 6 (six) hours as needed for  severe pain.    Dispense:  120 tablet    Refill:  0    Do not add this medication to the electronic "Automatic Refill" notification system. Patient may have prescription filled one day early if pharmacy is closed on scheduled refill date. Do not fill until:09/21/2017 To last until: 10/21/2017    Order Specific Question:   Supervising Provider    Answer:   Milinda Pointer [phone]  . oxyCODONE (OXY IR/ROXICODONE) 5 MG immediate release tablet    Sig: Take 1 tablet (5 mg total) by mouth every 6 (six) hours as needed for severe pain.    Dispense:  120 tablet    Refill:  0    Do not add this medication to the electronic "Automatic Refill" notification system. Patient may have prescription filled one day early if pharmacy is closed on scheduled refill date. Do not fill until: 08/22/2017 To last until: 09/21/2017    Order Specific Question:   Supervising Provider    Answer:   Milinda Pointer [phone]  . oxyCODONE (OXYCONTIN) 10 mg 12 hr tablet    Sig: Take 1 tablet (10 mg total) by mouth every 12 (twelve) hours.    Dispense:  60 tablet    Refill:  0    Do not add this medication to the electronic "Automatic Refill" notification system. Patient may have prescription filled one day early if pharmacy is closed on scheduled refill date. Do not fill until: 09/21/2017 To last until: 10/21/2017    Order Specific Question:   Supervising Provider    Answer:   Milinda Pointer [phone]  . oxyCODONE (OXYCONTIN) 10 mg 12 hr tablet    Sig: Take 1 tablet (10 mg total) by mouth every 12 (twelve) hours.    Dispense:  60 tablet    Refill:  0    Do not add this medication to the electronic "Automatic Refill" notification system. Patient may have prescription filled one day early if pharmacy is closed on scheduled refill date. Do not fill until: 08/22/2017 To last until: 09/21/2017  Order Specific Question:   Supervising Provider    Answer:   Milinda Pointer [[id number]]  This SmartLink is deprecated. Use  AVSMEDLIST instead to display the medication list for a patient. Medications administered today: Bea Graff. Tillman had no medications administered during this visit. Lab-work, procedure(s), and/or referral(s): No orders of the defined types were placed in this encounter.  Imaging and/or referral(s): None  Interventional therapies: Planned, scheduled, and/or pending:  Not at this time.   Considering:  Complete a series of 5 Hyalgan intra-articular knee injections, bilaterally.  Diagnostic bilateral cervical facet block  Possible bilateral cervical facet radiofrequencyablation  Diagnostic left sided cervical epiduralsteroid injection  Diagnostic bilateral intra-articular knee injection Diagnostic bilateral genicular nerve blocks Possible bilateral genicular nerve radiofrequencyablation  Diagnostic bilateral sacroiliac joint block Possible bilateral sacroiliac joint radiofrequencyablation  Diagnostic bilateral intra-articular shoulder jointinjection  Diagnostic bilateral suprascapular nerve block Possible bilateral suprascapular nerve radiofrequencyablation  Diagnostic bilateral lumbar facet block Possible bilateral lumbar facet radiofrequencyablation  Diagnostic left L4-5 lumbar epidural steroid injection    Palliative PRN treatment(s):  Bilateral intra-articular Hyalgan knee injectionseries (injection #4)  Diagnostic bilateral cervical facetblock  Diagnostic left sided cervical epiduralsteroid injection  Diagnostic bilateral intra-articular knee injection Diagnostic bilateral genicular nerve blocks Diagnostic bilateral sacroiliac joint block Diagnostic bilateral intra-articular shoulder joint injection  Diagnostic bilateral suprascapularnerve block  Diagnostic bilateral lumbar facetblock  Diagnostic left L4-5 lumbar epidural steroid injection    Provider-requested follow-up: Return in about 3 months (around 11/12/2017) for MedMgmt with Me Donella Stade  [place]).  Future Appointments  Date Time Provider [place]  08/29/2017  1:30 PM Abbie Sons, MD [facility] None  11/12/2017 12:45 PM Vevelyn Francois, NP [facility] None  02/11/2018  2:00 PM Ria Bush, MD [facility]  08/09/2018  8:05 AM [facility]  08/16/2018  2:30 PM Eustace Pen, LPN [facility]  4/88/8916  3:00 PM Ria Bush, MD [facility]   Primary Care Physician: Ria Bush, MD Location: [facility] Note by: Vevelyn Francois NP Date: 08/14/2017; Time: 3:38 PM  Pain Score Disclaimer: We use the NRS-11 scale. This is a self-reported, subjective measurement of pain severity with only modest accuracy. It is used primarily to identify changes within a particular patient. It must be understood that outpatient pain scales are significantly less accurate that those used for research, where they can be applied under ideal controlled circumstances with minimal exposure to variables. In reality, the score is likely to be a combination of pain intensity and pain affect, where pain affect describes the degree of emotional arousal or changes in action readiness caused by the sensory experience of pain. Factors such as social and work situation, setting, emotional state, anxiety levels, expectation, and prior pain experience may influence pain perception and show large inter-individual differences that may also be affected by time variables.  Patient instructions provided during this appointment: Patient Instructions    ____________________________________________________________________________________________  Medication Rules  Applies to: All patients receiving prescriptions (written or electronic).  Pharmacy of record: Pharmacy where electronic prescriptions will be sent. If written prescriptions are taken to a different pharmacy, please inform the nursing staff. The pharmacy listed in the electronic medical record should  be the one where you would like electronic prescriptions to be sent.  Prescription refills: Only during scheduled appointments. Applies to both, written and electronic prescriptions.  NOTE: The following applies primarily to controlled substances (Opioid* Pain Medications).   Patient's responsibilities: 1. Pain Pills: Bring all pain pills to  every appointment (except for procedure appointments). 2. Pill Bottles: Bring pills in original pharmacy bottle. Always bring newest bottle. Bring bottle, even if empty. 3. Medication refills: You are responsible for knowing and keeping track of what medications you need refilled. The day before your appointment, write a list of all prescriptions that need to be refilled. Bring that list to your appointment and give it to the admitting nurse. Prescriptions will be written only during appointments. If you forget a medication, it will not be "Called in", "Faxed", or "electronically sent". You will need to get another appointment to get these prescribed. 4. Prescription Accuracy: You are responsible for carefully inspecting your prescriptions before leaving our office. Have the discharge nurse carefully go over each prescription with you, before taking them home. Make sure that your name is accurately spelled, that your address is correct. Check the name and dose of your medication to make sure it is accurate. Check the number of pills, and the written instructions to make sure they are clear and accurate. Make sure that you are given enough medication to last until your next medication refill appointment. 5. Taking Medication: Take medication as prescribed. Never take more pills than instructed. Never take medication more frequently than prescribed. Taking less pills or less frequently is permitted and encouraged, when it comes to controlled substances (written prescriptions).  6. Inform other Doctors: Always inform, all of your healthcare providers, of all the  medications you take. 7. Pain Medication from other Providers: You are not allowed to accept any additional pain medication from any other Doctor or Healthcare provider. There are two exceptions to this rule. (see below) In the event that you require additional pain medication, you are responsible for notifying us, as stated below. 8. Medication Agreement: You are responsible for carefully reading and following our Medication Agreement. This must be signed before receiving any prescriptions from our practice. Safely store a copy of your signed Agreement. Violations to the Agreement will result in no further prescriptions. (Additional copies of our Medication Agreement are available upon request.) 9. Laws, Rules, & Regulations: All patients are expected to follow all Federal and [employer], [employer], Rules, [employer]. Ignorance of the Laws does not constitute a valid excuse. The use of any illegal substances is prohibited. 10. Adopted CDC guidelines & recommendations: Target dosing levels will be at or below 60 MME/day. Use of benzodiazepines** is not recommended.  Exceptions: There are only two exceptions to the rule of not receiving pain medications from other Healthcare Providers. 1. Exception #1 (Emergencies): In the event of an emergency (i.e.: accident requiring emergency care), you are allowed to receive additional pain medication. However, you are responsible for: As soon as you are able, call our office [phone], at any time of the day or night, and leave a message stating your name, the date and nature of the emergency, and the name and dose of the medication prescribed. In the event that your call is answered by a member of our staff, make sure to document and save the date, time, and the name of the person that took your information.  2. Exception #2 (Planned Surgery): In the event that you are scheduled by another doctor or dentist to have any type of surgery or procedure, you are  allowed (for a period no longer than 30 days), to receive additional pain medication, for the acute post-op pain. However, in this case, you are responsible for picking up a copy of our "Post-op Pain Management  for Surgeons" handout, and giving it to your surgeon or dentist. This document is available at our office, and does not require an appointment to obtain it. Simply go to our office during business hours (Monday-Thursday from 8:00 AM to 4:00 PM) (Friday 8:00 AM to 12:00 Noon) or if you have a scheduled appointment with [place], prior to your surgery, and ask for it by name. In addition, you will need to provide [place] with your name, name of your surgeon, type of surgery, and date of procedure or surgery.  *Opioid medications include: morphine, codeine, oxycodone, oxymorphone, hydrocodone, hydromorphone, meperidine, tramadol, tapentadol, buprenorphine, fentanyl, methadone. **Benzodiazepine medications include: diazepam (Valium), alprazolam (Xanax), clonazepam (Klonopine), lorazepam (Ativan), clorazepate (Tranxene), chlordiazepoxide (Librium), estazolam (Prosom), oxazepam (Serax), temazepam (Restoril), triazolam (Halcion)  ____________________________________________________________________________________________   BMI Assessment: Estimated body mass index is 35.24 kg/m as calculated from the following:   Height as of this encounter: 5' 7"  (1.702 m).   Weight as of this encounter: 225 lb (102.1 kg).  BMI interpretation table: BMI level Category Range association with higher incidence of chronic pain  <18 kg/m2 Underweight   18.5-24.9 kg/m2 Ideal body weight   25-29.9 kg/m2 Overweight Increased incidence by 20%  30-34.9 kg/m2 Obese (Class I) Increased incidence by 68%  35-39.9 kg/m2 Severe obesity (Class II) Increased incidence by 136%  >40 kg/m2 Extreme obesity (Class III) Increased incidence by 254%   BMI Readings from Last 4 Encounters:  08/14/17 35.24 kg/m  08/13/17 35.24 kg/m  08/13/17  35.24 kg/m  06/14/17 35.08 kg/m   Wt Readings from Last 4 Encounters:  08/14/17 225 lb (102.1 kg)  08/13/17 225 lb (102.1 kg)  08/13/17 225 lb (102.1 kg)  06/14/17 224 lb (101.6 kg)

## 2017-08-15 NOTE — Progress Notes (Signed)
I reviewed health advisor's note, was available for consultation, and agree with documentation and plan.  

## 2017-08-22 DIAGNOSIS — I10 Essential (primary) hypertension: Secondary | ICD-10-CM | POA: Diagnosis not present

## 2017-08-22 DIAGNOSIS — R001 Bradycardia, unspecified: Secondary | ICD-10-CM | POA: Diagnosis not present

## 2017-08-23 DIAGNOSIS — H40053 Ocular hypertension, bilateral: Secondary | ICD-10-CM | POA: Diagnosis not present

## 2017-08-23 DIAGNOSIS — H524 Presbyopia: Secondary | ICD-10-CM | POA: Diagnosis not present

## 2017-08-27 ENCOUNTER — Telehealth: Payer: Self-pay | Admitting: *Deleted

## 2017-08-27 DIAGNOSIS — G4733 Obstructive sleep apnea (adult) (pediatric): Secondary | ICD-10-CM | POA: Diagnosis not present

## 2017-08-27 NOTE — Telephone Encounter (Signed)
Copied from CRM [phone]. Topic: General - Other >> Aug 27, 2017 10:37 AM Percival SpanishKennedy, Cheryl W wrote:  Pt had previously spoke with Dr  Reece AgarG about writing him a letter about getting a dog at his appt complex. The appt complex is requiring him to get a letter from his doctor. He said Dr Reece AgarG had told him he would not write the letter that he would need to get a psychiatrist  to write him one but he has never seen a psychiatrist  and is asking if Dr Reece AgarG know of one that he can see this week since he has found a dog.   [phone] pt ask if he could received a call back tomorrow 08/28/16

## 2017-08-29 ENCOUNTER — Ambulatory Visit: Payer: Medicare Other | Admitting: Urology

## 2017-08-29 ENCOUNTER — Encounter: Payer: Self-pay | Admitting: Urology

## 2017-08-29 VITALS — BP 174/76 | HR 93 | Ht 67.0 in | Wt 225.9 lb

## 2017-08-29 DIAGNOSIS — N200 Calculus of kidney: Secondary | ICD-10-CM

## 2017-08-29 NOTE — Progress Notes (Signed)
08/29/2017 1:41 PM   Mark [name] 02/23/1956 [id number]  Referring provider: Ria Bush, MD [address] [place], [place] [address]  Chief Complaint  Patient presents with  . Follow-up    HPI: 62 year old male presents for follow-up of nephrolithiasis.  I last saw him at [facility] on 10/23/2016.  He has a history of recurrent stone disease.  He has a history of hypocitraturia and hyperoxaluria however on a follow-up 24-hour urine last year these were normal.  He remains on potassium citrate.  He denies flank or abdominal pain.  Prior CT in July 2017 showed bilateral, nonobstructing renal calculi.  He had a PSA on 08/08/2017 with his primary provider which was stable at 0.82.   PMH: Past Medical History:  Diagnosis Date  . Anxiety   . Chronic pain   . Depression   . History of kidney stones April 12,2017   removed two kidney stones about 1 cm in size  . History of permanent cardiac pacemaker placement 05/31/2015  . Hydrocele in adult 05/07/2015  . Kidney stones   . Migraine   . Prostatitis   . Sleep apnea   . Sleep apnea    getting new cpap machine    Surgical History: Past Surgical History:  Procedure Laterality Date  . CARDIOVASCULAR STRESS TEST  2014   Dr Ubaldo Glassing  . CARPAL TUNNEL RELEASE Bilateral 2006  . CERVICAL SPINE SURGERY  2008   C5/6  . COLONOSCOPY  07/2007   prostate nodule, hemorrhoid, rpt 5 yrs [name])  . LITHOTRIPSY Bilateral 2012   Adeli Frost  . LITHOTRIPSY Left 11/2015   x2 Kegan Mckeithan  . PACEMAKER PLACEMENT  2009   s/p Medtronic Adapta DR Pacemaker (Dr. Ubaldo Glassing)  . ROTATOR CUFF REPAIR Left 2009  . TONSILLECTOMY  1964  . ULNAR NERVE REPAIR  2008   Left    Home Medications:  Allergies as of 08/29/2017      Reactions   Ciprofloxacin    Fluoroquinolones Panic attack Fluoroquinolones   Metformin Other (See Comments)   Bad acid reflux   Penicillin V Potassium Other (See Comments)   Penicillins Other (See Comments)   Rash    Buprenorphine Hcl  Nausea Only   Morphine And Related Nausea Only      Medication List        Accurate as of 08/29/17  1:41 PM. Always use your most recent med list.          aspirin 81 MG tablet Take 81 mg by mouth daily.   atorvastatin 20 MG tablet Commonly known as:  LIPITOR Take 1 tablet (20 mg total) by mouth daily.   cetirizine 10 MG tablet Commonly known as:  ZYRTEC Take 10 mg by mouth daily. As needed   clindamycin 150 MG capsule Commonly known as:  CLEOCIN Take 150 mg by mouth as needed. 4 caps 1 hour before dental procedures.   CRANBERRY PO Take 4,200 mg by mouth daily.   desonide 0.05 % ointment Commonly known as:  DESOWEN Apply topically 2 (two) times daily.   Fish Oil 1200 MG Caps Take 1,200 mg by mouth daily.   FLUoxetine 40 MG capsule Commonly known as:  PROZAC TAKE 1 BY MOUTH DAILY   fluticasone 50 MCG/ACT nasal spray Commonly known as:  FLONASE Place 2 sprays into both nostrils daily.   furosemide 20 MG tablet Commonly known as:  LASIX TAKE 1 BY MOUTH DAILY   Garlic 0092 MG Caps Take 1 capsule by mouth daily.  MIRALAX powder Generic drug:  polyethylene glycol powder Take 1 Container by mouth as needed.   MULTIVITAMIN PO Take 1 tablet by mouth daily.   naloxone 4 MG/0.1ML Liqd nasal spray kit Commonly known as:  NARCAN Spray into one nostril. Repeat with second device into other nostril after 2-3 minutes if no or minimal response.   naproxen 500 MG tablet Commonly known as:  NAPROSYN TAKE 1 BY MOUTH TWICE DAILY WITH A MEAL   omeprazole 20 MG capsule Commonly known as:  PRILOSEC TAKE 1 BY MOUTH DAILY   oxyCODONE 5 MG immediate release tablet Commonly known as:  Oxy IR/ROXICODONE Take 1 tablet (5 mg total) by mouth every 6 (six) hours as needed for severe pain. Start taking on:  10/21/2017   oxyCODONE 10 mg 12 hr tablet Commonly known as:  OXYCONTIN Take 1 tablet (10 mg total) by mouth every 12 (twelve) hours. Start taking on:  10/21/2017     potassium citrate 10 MEQ (1080 MG) SR tablet Commonly known as:  UROCIT-K Take 1 tablet (10 mEq total) by mouth 2 (two) times daily.   SAW PALMETTO PO Take 450 mg by mouth daily.   sildenafil 20 MG tablet Commonly known as:  REVATIO 3-5 tablets daily as needed   VITAMIN B COMPLEX-C Caps Take by mouth daily.   Vitamin D 2000 units Caps Take 1 capsule by mouth daily.   vitamin E 1000 UNIT capsule       Allergies:  Allergies  Allergen Reactions  . Ciprofloxacin     Fluoroquinolones Panic attack Fluoroquinolones  . Metformin Other (See Comments)    Bad acid reflux  . Penicillin V Potassium Other (See Comments)  . Penicillins Other (See Comments)    Rash   . Buprenorphine Hcl Nausea Only  . Morphine And Related Nausea Only    Family History: Family History  Problem Relation Age of Onset  . Heart disease Mother   . Diabetes Mother   . Arthritis Mother   . Cancer Mother        Breast Cancer  . Heart block Father   . Heart disease Father   . Arthritis Father   . Cancer Father        skin cancer    Social History:  reports that  has never smoked. he has never used smokeless tobacco. He reports that he does not drink alcohol or use drugs.  ROS: UROLOGY Frequent Urination?: No Hard to postpone urination?: No Burning/pain with urination?: No Get up at night to urinate?: No Leakage of urine?: No Urine stream starts and stops?: No Trouble starting stream?: No Do you have to strain to urinate?: No Blood in urine?: No Urinary tract infection?: No Sexually transmitted disease?: No Injury to kidneys or bladder?: No Painful intercourse?: No Weak stream?: No Erection problems?: No Penile pain?: No  Gastrointestinal Nausea?: No Vomiting?: No Indigestion/heartburn?: No Diarrhea?: No Constipation?: No  Constitutional Fever: No Night sweats?: No Weight loss?: No Fatigue?: No  Skin Skin rash/lesions?: No Itching?: No  Eyes Blurred vision?:  No Double vision?: No  Ears/Nose/Throat Sore throat?: No Sinus problems?: Yes  Hematologic/Lymphatic Swollen glands?: No Easy bruising?: No  Cardiovascular Leg swelling?: Yes Chest pain?: No  Respiratory Cough?: No Shortness of breath?: Yes  Endocrine Excessive thirst?: No  Musculoskeletal Back pain?: Yes Joint pain?: Yes  Neurological Headaches?: No Dizziness?: No  Psychologic Depression?: Yes Anxiety?: No  Physical Exam: BP (!) 174/76 (BP Location: Right Arm, Patient Position: Sitting, Cuff Size:  Large)   Pulse 93   Ht 5' 7"  (1.702 m)   Wt 225 lb 14.4 oz (102.5 kg)   BMI 35.38 kg/m   Constitutional:  Alert and oriented, No acute distress. HEENT: [place] AT, moist mucus membranes.  Trachea midline, no masses. Cardiovascular: No clubbing, cyanosis, or edema. Respiratory: Normal respiratory effort, no increased work of breathing. GI: Abdomen is soft, nontender, nondistended, no abdominal masses GU: No CVA tenderness.  35 g, smooth without nodules Skin: No rashes, bruises or suspicious lesions. Lymph: No cervical or inguinal adenopathy. Neurologic: Grossly intact, no focal deficits, moving all 4 extremities. Psychiatric: Normal mood and affect.  Laboratory Data: Lab Results  Component Value Date   WBC 8.9 08/08/2017   HGB 15.2 08/08/2017   HCT 43.7 08/08/2017   MCV 86.8 08/08/2017   PLT 186.0 08/08/2017    Lab Results  Component Value Date   CREATININE 1.03 08/08/2017    Lab Results  Component Value Date   TESTOSTERONE 216.29 (L) 02/13/2017    Lab Results  Component Value Date   HGBA1C 5.5 08/08/2017    Urinalysis   Assessment & Plan:   1.  Nephrolithiasis Continue potassium citrate which was refilled.  Schedule renal ultrasound/KUB  Follow-up annually or as needed for recurrent stone symptoms.    Abbie Sons, [place] [address] [place], [place] [address] [phone]

## 2017-08-29 NOTE — Telephone Encounter (Signed)
I don't remember discussion - what was reason for dog? And does apartment complex not allow them? What specifically does he need?

## 2017-08-30 ENCOUNTER — Encounter: Payer: Self-pay | Admitting: Urology

## 2017-08-30 MED ORDER — POTASSIUM CITRATE ER 10 MEQ (1080 MG) PO TBCR: 10.0000 meq | EXTENDED_RELEASE_TABLET | Freq: Two times a day (BID) | ORAL | 3 refills | Status: DC

## 2017-08-30 NOTE — Telephone Encounter (Signed)
Pt calling back stating that it was recommended that he get a "companion pet" because he lives alone and does not get out of the house much. [facility] [address] [facility] [address] in [place]The complex does not allow animals in the apartment unless they receive a letter from the doctor. Pt is not currently seeing a psychiatrist. Pt can be contacted at [phone]

## 2017-08-30 NOTE — Telephone Encounter (Signed)
Left message on vm for pt to call back.  Need to relay Dr. Timoteo ExposeG's message and get answers to his questions.

## 2017-09-03 NOTE — Telephone Encounter (Signed)
Spoke with pt relaying relaying message per Dr. Reece AgarG.  Pt requests letter be mailed to him.  Placed letter to be mailed.

## 2017-09-03 NOTE — Telephone Encounter (Signed)
I will write letter but am not involved in certifying pet as service animal so if he needs this will need to go through official courses.  Letter written and in Lisa's box.

## 2017-09-05 DIAGNOSIS — Z1212 Encounter for screening for malignant neoplasm of rectum: Secondary | ICD-10-CM | POA: Diagnosis not present

## 2017-09-05 DIAGNOSIS — Z1211 Encounter for screening for malignant neoplasm of colon: Secondary | ICD-10-CM | POA: Diagnosis not present

## 2017-09-21 DIAGNOSIS — R195 Other fecal abnormalities: Secondary | ICD-10-CM | POA: Insufficient documentation

## 2017-09-21 LAB — COLOGUARD
Cologuard: POSITIVE
Cologuard: POSITIVE

## 2017-09-23 ENCOUNTER — Telehealth: Payer: Self-pay | Admitting: Family Medicine

## 2017-09-23 DIAGNOSIS — R195 Other fecal abnormalities: Secondary | ICD-10-CM

## 2017-09-23 NOTE — Telephone Encounter (Signed)
cologard positive. plz notify patient. I have placed GI referral for further eval and colonoscopy. Placed in Lisa's box.

## 2017-09-24 ENCOUNTER — Encounter: Payer: Self-pay | Admitting: Family Medicine

## 2017-09-24 ENCOUNTER — Telehealth: Payer: Self-pay | Admitting: Family Medicine

## 2017-09-24 NOTE — Telephone Encounter (Signed)
Copied from CRM [phone]. Topic: Quick Communication - See Telephone Encounter >> Sep 24, 2017 12:16 PM Rudi CocoLathan, Ilene Witcher M, NT wrote: CRM for notification. See Telephone encounter for:   09/24/17. Grant calling from [facility] calling to see if abnormal lab was received by pt. Kennedy BuckerGrant can be reached at [phone]. (providers support)

## 2017-09-24 NOTE — Telephone Encounter (Signed)
Left message on vm for pt to call back.   Need to relay Cologuard results and Dr. Timoteo ExposeG's message.

## 2017-09-24 NOTE — Telephone Encounter (Signed)
Patient returned call, results given per note Dr. Sharen HonesGutierrez 09/23/17, patient verbalized understanding, he says he has a GI doctor he's seen in the past and will give him a call to schedule an appointment. He says there's no need to place a referral.

## 2017-09-24 NOTE — Telephone Encounter (Signed)
This encounter was created in error - please disregard.

## 2017-09-25 NOTE — Telephone Encounter (Signed)
Noted. Fwd message to Dr. G. 

## 2017-09-26 NOTE — Telephone Encounter (Signed)
Spoke with [facility] informing them the pt has been notified of the results.  Says they will make note.

## 2017-09-28 DIAGNOSIS — G4733 Obstructive sleep apnea (adult) (pediatric): Secondary | ICD-10-CM | POA: Diagnosis not present

## 2017-10-04 ENCOUNTER — Encounter: Payer: Self-pay | Admitting: Internal Medicine

## 2017-10-08 ENCOUNTER — Encounter: Payer: Self-pay | Admitting: Internal Medicine

## 2017-10-08 ENCOUNTER — Other Ambulatory Visit: Payer: Self-pay | Admitting: Internal Medicine

## 2017-10-08 ENCOUNTER — Ambulatory Visit
Admission: RE | Admit: 2017-10-08 | Discharge: 2017-10-08 | Disposition: A | Discharge status: Home / Self Care | Payer: Medicare Other | Source: Ambulatory Visit | Attending: Urology | Admitting: Urology

## 2017-10-08 ENCOUNTER — Ambulatory Visit: Payer: Medicare Other

## 2017-10-08 ENCOUNTER — Ambulatory Visit
Admission: RE | Admit: 2017-10-08 | Discharge: 2017-10-08 | Disposition: A | Discharge status: Home / Self Care | Payer: Medicare Other | Source: Ambulatory Visit | Attending: Internal Medicine | Admitting: Internal Medicine

## 2017-10-08 ENCOUNTER — Ambulatory Visit: Payer: Medicare Other | Admitting: Internal Medicine

## 2017-10-08 VITALS — BP 144/80 | HR 92 | Ht 64.0 in | Wt 229.0 lb

## 2017-10-08 DIAGNOSIS — G4731 Primary central sleep apnea: Secondary | ICD-10-CM | POA: Diagnosis not present

## 2017-10-08 DIAGNOSIS — G4733 Obstructive sleep apnea (adult) (pediatric): Secondary | ICD-10-CM

## 2017-10-08 DIAGNOSIS — F1190 Opioid use, unspecified, uncomplicated: Secondary | ICD-10-CM | POA: Diagnosis not present

## 2017-10-08 DIAGNOSIS — N281 Cyst of kidney, acquired: Secondary | ICD-10-CM | POA: Diagnosis not present

## 2017-10-08 DIAGNOSIS — G4737 Central sleep apnea in conditions classified elsewhere: Secondary | ICD-10-CM | POA: Insufficient documentation

## 2017-10-08 DIAGNOSIS — N200 Calculus of kidney: Secondary | ICD-10-CM

## 2017-10-08 LAB — BLOOD GAS, ARTERIAL
Acid-Base Excess: 0.7 mmol/L (ref 0.0–2.0)
Bicarbonate: 25.4 mmol/L (ref 20.0–28.0)
FIO2: 0.21
O2 Saturation: 96.1 %
Patient temperature: 37
pCO2 arterial: 40 mmHg (ref 32.0–48.0)
pH, Arterial: 7.41 (ref 7.350–7.450)
pO2, Arterial: 82 mmHg — ABNORMAL LOW (ref 83.0–108.0)

## 2017-10-08 NOTE — Patient Instructions (Addendum)
Will send for bipap titration for central sleep apnea.  Will send for echocardiogram and arterial blood gas.

## 2017-10-08 NOTE — Progress Notes (Signed)
[facility] [place] [facility] Medicine Consultation      Assessment and Plan:  Obstructive sleep apnea-severe, with central sleep apnea/complex sleep apnea.  -Now with central sleep apnea seen on his recent download report, likely consistent with central sleep apnea. - Central sleep apnea may be secondary to chronic opioid use versus treatment emergent versus other causes. -We will send for a BiPAP titration, check arterial blood gas, echocardiogram.  Cardiac arrhythmia with history of pacemaker placement.   Anxiety.  -Sleep apnea can contribute to these above issues, therefore it is important to treat sleep apnea if present.  Orders Placed This Encounter  Procedures  . Blood Gas, Arterial  . ECHOCARDIOGRAM COMPLETE  . Bipap titration   Return in about 3 months (around 01/08/2018).  Date: 10/08/2017  MRN# [id number] Mark [name] [birth year]    Mark [name] is a 62 y.o. old male seen in consultation for chief complaint of:    Chief Complaint  Patient presents with  . Follow-up    sleep issue: pressure ramps up after 3-4 hours and blows the mask:     HPI:  The patient is a 62 year old male, recently underwent a sleep study which showed severe obstructive sleep apnea, started on AutoPap at a level of 12-20. Today he finds that he has been having trouble tolerating his CPAP, because when the pressure titrates upwards overnight he wakes up and finds that the pressure is too high causing a leak in his mask. Review of download shows that the machine is titrating up to the max pressure of 20, he continues to have apneas with an AHI of 13.6, predominantly centrals.  He takes 10 mg oxycontin twice daily and 5 mg oxycodone 2-3 times per day prn.   Review of compliance report 30 days as of 10/04/17.  Usage greater than 4 hours is 15/30 days.  Average usage on days used is 4 hours 44 minutes.  Pressure ranges 12-20.  Median pressure is 16, 95th percentile pressure is 19, maximum pressure is 20.   Residual AHI is 13.6, central apnea index is 7.  **Sleep study 07/03/17; Severe OSA with AHI of 42.7 **CPAP titration 07/26/17-started on AutoPap 12-20.  Medication:    Current Outpatient Medications:  .  aspirin 81 MG tablet, Take 81 mg by mouth daily.  , Disp: , Rfl:  .  atorvastatin (LIPITOR) 20 MG tablet, Take 1 tablet (20 mg total) by mouth daily., Disp: 90 tablet, Rfl: 0 .  cetirizine (ZYRTEC) 10 MG tablet, Take 10 mg by mouth daily. As needed, Disp: , Rfl:  .  Cholecalciferol (VITAMIN D) 2000 units CAPS, Take 1 capsule by mouth daily., Disp: , Rfl:  .  clindamycin (CLEOCIN) 150 MG capsule, Take 150 mg by mouth as needed. 4 caps 1 hour before dental procedures. , Disp: , Rfl:  .  CRANBERRY PO, Take 4,200 mg by mouth daily. , Disp: , Rfl:  .  desonide (DESOWEN) 0.05 % ointment, Apply topically 2 (two) times daily. (Patient taking differently: Apply topically as needed. ), Disp: 60 g, Rfl: 1 .  FLUoxetine (PROZAC) 40 MG capsule, TAKE 1 BY MOUTH DAILY, Disp: 90 capsule, Rfl: 1 .  fluticasone (FLONASE) 50 MCG/ACT nasal spray, Place 2 sprays into both nostrils daily., Disp: 48 g, Rfl: 1 .  furosemide (LASIX) 20 MG tablet, TAKE 1 BY MOUTH DAILY, Disp: 90 tablet, Rfl: 1 .  Garlic 1000 MG CAPS, Take 1 capsule by mouth daily.  , Disp: , Rfl:  .  Multiple  Vitamin (MULTIVITAMIN PO), Take 1 tablet by mouth daily.  , Disp: , Rfl:  .  Naloxone HCl (NARCAN) 4 MG/0.1ML LIQD, Spray into one nostril. Repeat with second device into other nostril after 2-3 minutes if no or minimal response., Disp: 2 each, Rfl: 0 .  naproxen (NAPROSYN) 500 MG tablet, TAKE 1 BY MOUTH TWICE DAILY WITH A MEAL, Disp: 180 tablet, Rfl: 1 .  Omega-3 Fatty Acids (FISH OIL) 1200 MG CAPS, Take 1,200 mg by mouth daily. , Disp: , Rfl:  .  omeprazole (PRILOSEC) 20 MG capsule, TAKE 1 BY MOUTH DAILY, Disp: 90 capsule, Rfl: 0 .  [START ON 10/21/2017] oxyCODONE (OXY IR/ROXICODONE) 5 MG immediate release tablet, Take 1 tablet (5 mg total) by  mouth every 6 (six) hours as needed for severe pain., Disp: 120 tablet, Rfl: 0 .  [START ON 10/21/2017] oxyCODONE (OXYCONTIN) 10 mg 12 hr tablet, Take 1 tablet (10 mg total) by mouth every 12 (twelve) hours., Disp: 60 tablet, Rfl: 0 .  polyethylene glycol powder (MIRALAX) powder, Take 1 Container by mouth as needed. , Disp: , Rfl:  .  potassium citrate (UROCIT-K) 10 MEQ (1080 MG) SR tablet, Take 1 tablet (10 mEq total) by mouth 2 (two) times daily., Disp: 180 tablet, Rfl: 3 .  Saw Palmetto, Serenoa repens, (SAW PALMETTO PO), Take 450 mg by mouth daily. , Disp: , Rfl:  .  sildenafil (REVATIO) 20 MG tablet, 3-5 tablets daily as needed, Disp: , Rfl:  .  VITAMIN B COMPLEX-C CAPS, Take by mouth daily. , Disp: , Rfl:  .  vitamin E 1000 UNIT capsule, , Disp: , Rfl:    Allergies:  Ciprofloxacin; Metformin; Penicillin v potassium; Penicillins; Buprenorphine hcl; and Morphine and related  Review of Systems: Gen:  Denies  fever, sweats, chills HEENT: Denies blurred vision, double vision. bleeds, sore throat Cvc:  No dizziness, chest pain. Resp:   Denies cough or sputum production, shortness of breath Gi: Denies swallowing difficulty, stomach pain. Gu:  Denies bladder incontinence, burning urine Ext:   No Joint pain, stiffness. Skin: No skin rash,  hives  Endoc:  No polyuria, polydipsia. Psych: No depression, insomnia. Other:  All other systems were reviewed with the patient and were negative other that what is mentioned in the HPI.   Physical Examination:   VS: BP (!) 144/80 (BP Location: Left Arm, Cuff Size: Normal)   Pulse 92   Ht 5\' 4"  (1.626 m)   Wt 229 lb (103.9 kg)   SpO2 96%   BMI 39.31 kg/m   General Appearance: No distress  Neuro:without focal findings,  speech normal,  HEENT: PERRLA, EOM intact.  Mallampati 4 Pulmonary: normal breath sounds, No wheezing.  CardiovascularNormal S1,S2.  No m/r/g.   Abdomen: Benign, Soft, non-tender. Renal:  No costovertebral tenderness  GU:  No  performed at this time. Endoc: No evident thyromegaly, no signs of acromegaly. Skin:   warm, no rashes, no ecchymosis  Extremities: normal, no cyanosis, clubbing.  Other findings:    LABORATORY PANEL:   CBC No results for input(s): WBC, HGB, HCT, PLT in the last 168 hours. ------------------------------------------------------------------------------------------------------------------  Chemistries  No results for input(s): NA, K, CL, CO2, GLUCOSE, BUN, CREATININE, CALCIUM, MG, AST, ALT, ALKPHOS, BILITOT in the last 168 hours.  Invalid input(s): GFRCGP ------------------------------------------------------------------------------------------------------------------  Cardiac Enzymes No results for input(s): TROPONINI in the last 168 hours. ------------------------------------------------------------  RADIOLOGY:  No results found.     Thank  you for the consultation and for [facility] to assist in the care of your patient. Our recommendations are noted above.  Please contact us if we can be of further service.   Wells Guiles, MD.  Board Certified in Internal Medicine, Pulmonary Medicine, Critical Care Medicine, and Sleep Medicine.  [facility] Office Number: [phone]  Santiago Glad, M.D.  Billy Fischer, M.D  10/08/2017

## 2017-10-09 ENCOUNTER — Telehealth: Payer: Self-pay

## 2017-10-09 NOTE — Telephone Encounter (Signed)
-----   Message from Riki AltesScott C Stoioff, MD sent at 10/09/2017  8:16 AM EST ----- Renal ultrasound and KUB showed bilateral, nonobstructing renal calculi.  Recommend office visit 1 year and a repeat KUB in 6 months.

## 2017-10-09 NOTE — Telephone Encounter (Signed)
Letter sent.

## 2017-10-11 ENCOUNTER — Ambulatory Visit
Admission: RE | Admit: 2017-10-11 | Discharge: 2017-10-11 | Disposition: A | Discharge status: Home / Self Care | Payer: Medicare Other | Source: Ambulatory Visit | Attending: Internal Medicine | Admitting: Internal Medicine

## 2017-10-11 DIAGNOSIS — F1190 Opioid use, unspecified, uncomplicated: Secondary | ICD-10-CM

## 2017-10-11 DIAGNOSIS — G4737 Central sleep apnea in conditions classified elsewhere: Secondary | ICD-10-CM

## 2017-10-11 DIAGNOSIS — G4731 Primary central sleep apnea: Secondary | ICD-10-CM

## 2017-10-11 DIAGNOSIS — G4733 Obstructive sleep apnea (adult) (pediatric): Secondary | ICD-10-CM | POA: Insufficient documentation

## 2017-10-11 DIAGNOSIS — I517 Cardiomegaly: Secondary | ICD-10-CM | POA: Diagnosis not present

## 2017-10-11 DIAGNOSIS — R001 Bradycardia, unspecified: Secondary | ICD-10-CM | POA: Diagnosis not present

## 2017-10-11 NOTE — Progress Notes (Signed)
*  PRELIMINARY RESULTS* Echocardiogram 2D Echocardiogram has been performed.  Cristela BlueHege, Breonia Kirstein 10/11/2017, 11:15 AM

## 2017-10-16 ENCOUNTER — Other Ambulatory Visit: Payer: Self-pay | Admitting: Family Medicine

## 2017-10-18 ENCOUNTER — Ambulatory Visit: Payer: Medicare Other | Attending: Internal Medicine

## 2017-10-18 ENCOUNTER — Telehealth: Payer: Self-pay | Admitting: Family Medicine

## 2017-10-18 DIAGNOSIS — G4733 Obstructive sleep apnea (adult) (pediatric): Secondary | ICD-10-CM | POA: Insufficient documentation

## 2017-10-18 DIAGNOSIS — G4739 Other sleep apnea: Secondary | ICD-10-CM | POA: Diagnosis not present

## 2017-10-18 MED ORDER — OMEPRAZOLE 20 MG PO CPDR: DELAYED_RELEASE_CAPSULE | ORAL | 2 refills | Status: DC

## 2017-10-18 NOTE — Telephone Encounter (Signed)
Copied from CRM [phone]. Topic: Quick Communication - Rx Refill/Question >> Oct 18, 2017 12:05 PM Lelon FrohlichGolden, Shweta Aman, [place] wrote: Medication: omeprazole 20 mg   Has the patient contacted their pharmacy? yes   (Agent: If no, request that the patient contact the pharmacy for the refill.)   Preferred Pharmacy (with phone number or street name):[facility]   Agent: Please be advised that RX refills may take up to 3 business days. We ask that you follow-up with your pharmacy.

## 2017-10-26 DIAGNOSIS — G4733 Obstructive sleep apnea (adult) (pediatric): Secondary | ICD-10-CM | POA: Diagnosis not present

## 2017-10-31 DIAGNOSIS — G4733 Obstructive sleep apnea (adult) (pediatric): Secondary | ICD-10-CM | POA: Diagnosis not present

## 2017-11-02 ENCOUNTER — Other Ambulatory Visit: Payer: Self-pay | Admitting: Family Medicine

## 2017-11-02 MED ORDER — NAPROXEN 500 MG PO TABS: ORAL_TABLET | ORAL | 1 refills | Status: DC

## 2017-11-02 MED ORDER — FLUOXETINE HCL 40 MG PO CAPS: ORAL_CAPSULE | ORAL | 1 refills | Status: DC

## 2017-11-02 MED ORDER — FUROSEMIDE 20 MG PO TABS: ORAL_TABLET | ORAL | 3 refills | Status: DC

## 2017-11-02 NOTE — Telephone Encounter (Addendum)
Last rx:  04/26/17, #180 Last OV (CPE):  08/13/17 Next OV:  02/11/18  Sent refills for fluoxetine and furosemide.

## 2017-11-02 NOTE — Telephone Encounter (Signed)
Copied from CRM [id number]. Topic: Quick Communication - Rx Refill/Question >> Nov 02, 2017 11:27 AM Maia Petties wrote: Medication: pt requesting naproxen  (1 wk left), fluoxetine  (1 wk left), and furosemide  (1 day left) - he contacted pharmacy last week, he was advised to contact his doctor Has the patient contacted their pharmacy? yes Preferred Pharmacy (with phone number or street name): [facility] - [place], [place] - [address] AT [employer] [phone] (Phone) [phone] (Fax)

## 2017-11-07 ENCOUNTER — Telehealth: Payer: Self-pay | Admitting: *Deleted

## 2017-11-07 DIAGNOSIS — G4733 Obstructive sleep apnea (adult) (pediatric): Secondary | ICD-10-CM

## 2017-11-07 NOTE — Telephone Encounter (Signed)
Left message for patient to call back for titration study results.

## 2017-11-07 NOTE — Telephone Encounter (Signed)
Patient aware he will need Bipap therapy to replace CPAP. Orders placed Nothing further needed.

## 2017-11-12 ENCOUNTER — Other Ambulatory Visit: Payer: Self-pay

## 2017-11-12 ENCOUNTER — Ambulatory Visit: Payer: Medicare Other | Attending: Nurse Practitioner | Admitting: Nurse Practitioner

## 2017-11-12 ENCOUNTER — Encounter: Payer: Self-pay | Admitting: Nurse Practitioner

## 2017-11-12 VITALS — BP 130/75 | HR 65 | Temp 98.2°F | Resp 16 | Ht 67.0 in | Wt 229.0 lb

## 2017-11-12 DIAGNOSIS — Z7982 Long term (current) use of aspirin: Secondary | ICD-10-CM | POA: Diagnosis not present

## 2017-11-12 DIAGNOSIS — F329 Major depressive disorder, single episode, unspecified: Secondary | ICD-10-CM | POA: Diagnosis not present

## 2017-11-12 DIAGNOSIS — M48061 Spinal stenosis, lumbar region without neurogenic claudication: Secondary | ICD-10-CM | POA: Diagnosis not present

## 2017-11-12 DIAGNOSIS — Z8249 Family history of ischemic heart disease and other diseases of the circulatory system: Secondary | ICD-10-CM | POA: Insufficient documentation

## 2017-11-12 DIAGNOSIS — Z950 Presence of cardiac pacemaker: Secondary | ICD-10-CM | POA: Insufficient documentation

## 2017-11-12 DIAGNOSIS — Z79899 Other long term (current) drug therapy: Secondary | ICD-10-CM | POA: Diagnosis not present

## 2017-11-12 DIAGNOSIS — F411 Generalized anxiety disorder: Secondary | ICD-10-CM | POA: Insufficient documentation

## 2017-11-12 DIAGNOSIS — M25511 Pain in right shoulder: Secondary | ICD-10-CM | POA: Diagnosis not present

## 2017-11-12 DIAGNOSIS — G4733 Obstructive sleep apnea (adult) (pediatric): Secondary | ICD-10-CM | POA: Insufficient documentation

## 2017-11-12 DIAGNOSIS — Z791 Long term (current) use of non-steroidal anti-inflammatories (NSAID): Secondary | ICD-10-CM | POA: Insufficient documentation

## 2017-11-12 DIAGNOSIS — Z8261 Family history of arthritis: Secondary | ICD-10-CM | POA: Insufficient documentation

## 2017-11-12 DIAGNOSIS — R82991 Hypocitraturia: Secondary | ICD-10-CM | POA: Insufficient documentation

## 2017-11-12 DIAGNOSIS — G894 Chronic pain syndrome: Secondary | ICD-10-CM | POA: Diagnosis not present

## 2017-11-12 DIAGNOSIS — G8929 Other chronic pain: Secondary | ICD-10-CM

## 2017-11-12 DIAGNOSIS — N401 Enlarged prostate with lower urinary tract symptoms: Secondary | ICD-10-CM | POA: Insufficient documentation

## 2017-11-12 DIAGNOSIS — Z9889 Other specified postprocedural states: Secondary | ICD-10-CM | POA: Insufficient documentation

## 2017-11-12 DIAGNOSIS — M4722 Other spondylosis with radiculopathy, cervical region: Secondary | ICD-10-CM | POA: Diagnosis not present

## 2017-11-12 DIAGNOSIS — M170 Bilateral primary osteoarthritis of knee: Secondary | ICD-10-CM | POA: Diagnosis not present

## 2017-11-12 DIAGNOSIS — F1190 Opioid use, unspecified, uncomplicated: Secondary | ICD-10-CM

## 2017-11-12 DIAGNOSIS — Z79891 Long term (current) use of opiate analgesic: Secondary | ICD-10-CM | POA: Insufficient documentation

## 2017-11-12 DIAGNOSIS — M4802 Spinal stenosis, cervical region: Secondary | ICD-10-CM | POA: Insufficient documentation

## 2017-11-12 DIAGNOSIS — M25512 Pain in left shoulder: Secondary | ICD-10-CM | POA: Diagnosis not present

## 2017-11-12 DIAGNOSIS — Z87442 Personal history of urinary calculi: Secondary | ICD-10-CM | POA: Insufficient documentation

## 2017-11-12 DIAGNOSIS — E785 Hyperlipidemia, unspecified: Secondary | ICD-10-CM | POA: Diagnosis not present

## 2017-11-12 DIAGNOSIS — Z880 Allergy status to penicillin: Secondary | ICD-10-CM | POA: Insufficient documentation

## 2017-11-12 DIAGNOSIS — M19012 Primary osteoarthritis, left shoulder: Secondary | ICD-10-CM | POA: Diagnosis not present

## 2017-11-12 DIAGNOSIS — G4700 Insomnia, unspecified: Secondary | ICD-10-CM | POA: Diagnosis not present

## 2017-11-12 DIAGNOSIS — N433 Hydrocele, unspecified: Secondary | ICD-10-CM | POA: Insufficient documentation

## 2017-11-12 DIAGNOSIS — Z6839 Body mass index (BMI) 39.0-39.9, adult: Secondary | ICD-10-CM | POA: Insufficient documentation

## 2017-11-12 DIAGNOSIS — Z888 Allergy status to other drugs, medicaments and biological substances status: Secondary | ICD-10-CM | POA: Insufficient documentation

## 2017-11-12 DIAGNOSIS — R51 Headache: Secondary | ICD-10-CM | POA: Diagnosis not present

## 2017-11-12 DIAGNOSIS — Z833 Family history of diabetes mellitus: Secondary | ICD-10-CM | POA: Insufficient documentation

## 2017-11-12 DIAGNOSIS — Z881 Allergy status to other antibiotic agents status: Secondary | ICD-10-CM | POA: Insufficient documentation

## 2017-11-12 DIAGNOSIS — Z809 Family history of malignant neoplasm, unspecified: Secondary | ICD-10-CM | POA: Insufficient documentation

## 2017-11-12 DIAGNOSIS — N138 Other obstructive and reflux uropathy: Secondary | ICD-10-CM | POA: Diagnosis not present

## 2017-11-12 DIAGNOSIS — Z885 Allergy status to narcotic agent status: Secondary | ICD-10-CM | POA: Insufficient documentation

## 2017-11-12 MED ORDER — OXYCODONE HCL ER 10 MG PO T12A: 10.0000 mg | EXTENDED_RELEASE_TABLET | Freq: Two times a day (BID) | ORAL | 0 refills | Status: DC

## 2017-11-12 MED ORDER — OXYCODONE HCL 5 MG PO TABS: 5.0000 mg | ORAL_TABLET | Freq: Four times a day (QID) | ORAL | 0 refills | Status: DC | PRN

## 2017-11-12 NOTE — Patient Instructions (Addendum)
You have been given a script for oxycodone x 3 and oxycontin x 3.  BMI Assessment: Estimated body mass index is 35.87 kg/m as calculated from the following:   Height as of this encounter: 5\' 7"  (1.702 m).   Weight as of this encounter: 229 lb (103.9 kg).  BMI interpretation table: BMI level Category Range association with higher incidence of chronic pain  <18 kg/m2 Underweight   18.5-24.9 kg/m2 Ideal body weight   25-29.9 kg/m2 Overweight Increased incidence by 20%  30-34.9 kg/m2 Obese (Class I) Increased incidence by 68%  35-39.9 kg/m2 Severe obesity (Class II) Increased incidence by 136%  >40 kg/m2 Extreme obesity (Class III) Increased incidence by 254%   BMI Readings from Last 4 Encounters:  11/12/17 35.87 kg/m  10/08/17 39.31 kg/m  08/29/17 35.38 kg/m  08/14/17 35.24 kg/m   Wt Readings from Last 4 Encounters:  11/12/17 229 lb (103.9 kg)  10/08/17 229 lb (103.9 kg)  08/29/17 225 lb 14.4 oz (102.5 kg)  08/14/17 225 lb (102.1 kg)

## 2017-11-12 NOTE — Progress Notes (Signed)
Nursing Pain Medication Assessment:  Safety precautions to be maintained throughout the outpatient stay will include: orient to surroundings, keep bed in low position, maintain call bell within reach at all times, provide assistance with transfer out of bed and ambulation.  Medication Inspection Compliance: Pill count conducted under aseptic conditions, in front of the patient. Neither the pills nor the bottle was removed from the patient's sight at any time. Once count was completed pills were immediately returned to the patient in their original bottle.  Medication #1: Oxycodone IR Pill/Patch Count: 25 of 120. pills remain Pill/Patch Appearance: Markings consistent with prescribed medication Bottle Appearance: Standard pharmacy container. Clearly labeled. Filled Date:03/17/ 2019 Last Medication intake:  Yesterday  Medication #2: Oxycodone ER (OxyContin) Pill/Patch Count: 39 of 60 pills remain Pill/Patch Appearance: Markings consistent with prescribed medication Bottle Appearance: Standard pharmacy container. Clearly labeled. Filled Date: 03/17 / 2019 Last Medication intake:  Today

## 2017-11-12 NOTE — Progress Notes (Signed)
Patient's Name: Mark [name]  MRN: [id number]  Referring Provider: Ria Bush, MD  DOB: 1955/12/06  PCP: Ria Bush, MD  DOS: 11/12/2017  Note by: Vevelyn Francois NP  Service setting: Ambulatory outpatient  Specialty: Interventional Pain Management  Location: [facility] (AMB) Pain Management Facility    Patient type: Established    Primary Reason(s) for Visit: Encounter for prescription drug management. (Level of risk: moderate)  CC: Neck Pain  HPI  Mark [name] is a 62 y.o. year old, male patient, who comes today for a medication management evaluation. He has Chronic pain syndrome; MDD (major depressive disorder), recurrent episode, moderate ([place]); Insomnia; GAD (generalized anxiety disorder); Medicare annual wellness visit, subsequent; Hyperlipidemia; Severe obesity (BMI 35.0-39.9) with comorbidity ([place]); Health maintenance examination; Candidal dermatitis; Benign prostatic hyperplasia with urinary obstruction; Nephrolithiasis; Bradycardia; Cardiac murmur; Opiate use (60 MME/Day); Encounter for therapeutic drug level monitoring; Chronic neck pain (Location of Primary Source of Pain) (Bilateral) (L>R); Chronic shoulder pain (Bilateral) (L>R); Chronic knee pain (Bilateral) (R>L); History of permanent cardiac pacemaker placement; Opioid-induced constipation (OIC); Radicular pain of shoulder (Bilateral) (L>R); 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); Cervicogenic headache; Cervical spondylosis with radiculopathy (Bilateral) (L>R); Cervical facet syndrome (Bilateral) (L>R); Cervical foraminal stenosis (Severe C6-7) (Left); Chronic sacroiliac joint pain (Bilateral); Osteoarthritis of sacroiliac joint (Bilateral); Lumbar facet hypertrophy (L1-2, L2-3, and L4-5) (Bilateral); Lumbar IVDD (intervertebral disc displacement); 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); Lumbar facet syndrome (Bilateral); Osteoarthritis of shoulder (Left); Osteoarthritis of knee (Bilateral) (R>L); Obstructive  sleep apnea; Hypocitraturia; Long term current use of opiate analgesic; Chronic cervical radicular pain; Advanced care planning/counseling discussion; Chronic fatigue; and Low testosterone in male on their problem list. His primarily concern today is the Neck Pain  Pain Assessment: Location:   Neck Radiating: left shoulder, left arm to the hand Onset: More than a month ago Duration: Chronic pain Quality: Aching, Dull, Sharp Severity: 2 /10 (self-reported pain score)  Note: Reported level is compatible with observation.                          Timing: Constant Modifying factors: medications, sitting still  Mark [name] was last scheduled for an appointment on 08/14/2017 for medication management. During today's appointment we reviewed Mark [name]'s chronic pain status, as well as his outpatient medication regimen. He has tingling with some numbness. He admits that his left arm gets numb with lifting and he has weakness. He admits that the right hand is"pretty good". He denies any concerns today. He staets that he may have to go back on his depression and anxiety medication. He admits that weaning of the Clonazepam was difficulty and he does not want to return on this. .   The patient  reports that he does not use drugs. His body mass index is 35.87 kg/m.  Further details on both, my assessment(s), as well as the proposed treatment plan, please see below.  Controlled Substance Pharmacotherapy Assessment REMS (Risk Evaluation and Mitigation Strategy)  Analgesic:Oxycodone ER 10 mg every 12 hours (20 mg/day) + oxycodone IR 5 mg every 6 hours (20 mg/day) MME/day:60 mg/day.    Landis Martins, RN  11/12/2017 12:38 PM  Sign at close encounter Nursing Pain Medication Assessment:  Safety precautions to be maintained throughout the outpatient stay will include: orient to surroundings, keep bed in low position, maintain call bell within reach at all times, provide assistance with transfer out of bed and  ambulation.  Medication Inspection Compliance: Pill  count conducted under aseptic conditions, in front of the patient. Neither the pills nor the bottle was removed from the patient's sight at any time. Once count was completed pills were immediately returned to the patient in their original bottle.  Medication #1: Oxycodone IR Pill/Patch Count: 25 of 120. pills remain Pill/Patch Appearance: Markings consistent with prescribed medication Bottle Appearance: Standard pharmacy container. Clearly labeled. Filled Date:03/17/ 2019 Last Medication intake:  Yesterday  Medication #2: Oxycodone ER (OxyContin) Pill/Patch Count: 39 of 60 pills remain Pill/Patch Appearance: Markings consistent with prescribed medication Bottle Appearance: Standard pharmacy container. Clearly labeled. Filled Date: 03/17 / 2019 Last Medication intake:  Today   Pharmacokinetics: Liberation and absorption (onset of action): WNL Distribution (time to peak effect): WNL Metabolism and excretion (duration of action): WNL         Pharmacodynamics: Desired effects: Analgesia: Mark [name] reports >50% benefit. Functional ability: Patient reports that medication allows him to accomplish basic ADLs Clinically meaningful improvement in function (CMIF): Sustained CMIF goals met Perceived effectiveness: Described as relatively effective, allowing for increase in activities of daily living (ADL) Undesirable effects: Side-effects or Adverse reactions: None reported Monitoring: [place] PMP: Online review of the past 66-monthperiod conducted. Compliant with practice rules and regulations Last UDS on record: Summary  Date Value Ref Range Status  02/06/2017 FINAL  Final    Comment:    ==================================================================== TOXASSURE SELECT 13 (MW) ==================================================================== Test                             Result       Flag       Units Drug Present and Declared  for Prescription Verification   7-aminoclonazepam              317          EXPECTED   ng/mg creat    7-aminoclonazepam is an expected metabolite of clonazepam. Source    of clonazepam is a scheduled prescription medication.   Oxycodone                      1279         EXPECTED   ng/mg creat   Noroxycodone                   2043         EXPECTED   ng/mg creat    Sources of oxycodone include scheduled prescription medications.    Noroxycodone is an expected metabolite of oxycodone. ==================================================================== Test                      Result    Flag   Units      Ref Range   Creatinine              47               mg/dL      >=20 ==================================================================== Declared Medications:  The flagging and interpretation on this report are based on the  following declared medications.  Unexpected results may arise from  inaccuracies in the declared medications.  **Note: The testing scope of this panel includes these medications:  Clonazepam (Klonopin)  Oxycodone (OxyContin)  Oxycodone (Roxicodone)  **Note: The testing scope of this panel does not include following  reported medications:  Aspirin (Aspirin 81)  Atorvastatin (Lipitor)  Cetirizine (Zyrtec)  Cholecalciferol  Clindamycin  Fluoxetine  Fluticasone  Furosemide (Lasix)  Multivitamin  Naloxone (Narcan)  Naproxen (Naprosyn)  Omega-3 Fatty Acids (Fish Oil)  Omeprazole (Prilosec)  Polyethylene Glycol (GlycoLAX)  Potassium Citrate (Urocit-K)  Sildenafil (Revatio)  Supplement  Supplement (Saw Palmetto)  Topical  Vitamin B  Vitamin C  Vitamin E ==================================================================== For clinical consultation, please call [phone]. ====================================================================    UDS interpretation: Compliant          Medication Assessment Form: Reviewed. Patient indicates being compliant  with therapy Treatment compliance: Compliant Risk Assessment Profile: Aberrant behavior: See prior evaluations. None observed or detected today Comorbid factors increasing risk of overdose: See prior notes. No additional risks detected today Risk of substance use disorder (SUD): Low  ORT Scoring interpretation table:  Score <3 = Low Risk for SUD  Score between 4-7 = Moderate Risk for SUD  Score >8 = High Risk for Opioid Abuse   Risk Mitigation Strategies:  Patient Counseling: Covered Patient-Prescriber Agreement (PPA): Present and active  Notification to other healthcare providers: Done  Pharmacologic Plan: No change in therapy, at this time.             Laboratory Chemistry  Inflammation Markers (CRP: Acute Phase) (ESR: Chronic Phase) Lab Results  Component Value Date   CRP 1.7 (H) 09/01/2015   ESRSEDRATE 6 09/01/2015                         Rheumatology Markers No results found for: Elayne Guerin, [facility]                      Renal Function Markers Lab Results  Component Value Date   BUN 18 08/08/2017   CREATININE 1.03 08/08/2017   GFRAA >60 09/01/2015   GFRNONAA >60 09/01/2015                              Hepatic Function Markers Lab Results  Component Value Date   AST 23 08/08/2017   ALT 25 08/08/2017   ALBUMIN 4.0 08/08/2017   ALKPHOS 54 08/08/2017   HCVAB NEGATIVE 07/22/2013                        Electrolytes Lab Results  Component Value Date   NA 139 08/08/2017   K 3.8 08/08/2017   CL 103 08/08/2017   CALCIUM 8.9 08/08/2017   MG 2.1 09/01/2015                        Neuropathy Markers Lab Results  Component Value Date   VITAMINB12 861 02/02/2016   HGBA1C 5.5 08/08/2017                        Bone Pathology Markers Lab Results  Component Value Date   25OHVITD1 63 02/02/2016   25OHVITD2 <1.0 02/02/2016   25OHVITD3 63 02/02/2016   TESTOSTERONE 216.29 (L) 02/13/2017                         Coagulation  Parameters Lab Results  Component Value Date   INR 1.0 06/03/2007   LABPROT 13.7 06/03/2007   APTT 29 06/03/2007   PLT 186.0 08/08/2017                        Cardiovascular Markers Lab Results  Component Value Date   HGB 15.2 08/08/2017   HCT 43.7 08/08/2017                         CA Markers No results found for: CEA, CA125, LABCA2                      Note: Lab results reviewed.  Recent Diagnostic Imaging Results  ECHOCARDIOGRAM COMPLETE                 *[place], [place] [id number]                            [phone]  ------------------------------------------------------------------- Transthoracic Echocardiography  Patient:    Kimmie, Doren MR #:       [id number] Study Date: 10/11/2017 Gender:     M Age:        18 Height:     162.6 cm Weight:     103.9 kg BSA:        2.22 m^2 Pt. Status: Room:   SONOGRAPHER  [facility]  PERFORMING   [facility]  ATTENDING    [place], [place]  Jake Shark, Pradeep  REFERRING    [facility], [place]  cc:  ------------------------------------------------------------------- LV EF: 55% -   65%  ------------------------------------------------------------------- Indications:      Obstructive sleep apnea, complex sleep apnea syndrome, sleep apnea with prescibed opioid use..  ------------------------------------------------------------------- History:   PMH:  Anxiety, depression, migraine, sleep apnea.  ------------------------------------------------------------------- Study Conclusions  - Left ventricle: The cavity size was normal. There was mild   concentric hypertrophy. Systolic function was normal. The   estimated ejection fraction was in the range of 55% to 65%. - Aortic valve: Valve area (Vmax): 2.67 cm^2.  ------------------------------------------------------------------- Labs,  prior tests, procedures, and surgery: Permanent pacemaker system implantation.  ------------------------------------------------------------------- Study data:   Study status:  Routine.  Procedure:  The patient reported no pain pre or post test.          Transthoracic echocardiography.  M-mode, complete 2D, spectral Doppler, and color Doppler.  Birthdate:  Patient birthdate: 02-25-1956.  Age:  Patient is 62 yr old.  Sex:  Gender: male.    BMI: 39.3 kg/m^2.  Blood pressure:     144/80  Patient status:  Outpatient.  Study date: Study date: 10/11/2017. Study time: 10:47 AM.  Location:  Echo laboratory.  -------------------------------------------------------------------  ------------------------------------------------------------------- Left ventricle:  The cavity size was normal. There was mild concentric hypertrophy. Systolic function was normal. The estimated ejection fraction was in the range of 55% to 65%.  ------------------------------------------------------------------- Aortic valve:   Structurally normal valve.   Cusp separation was normal.  Doppler:  Transvalvular velocity was within the normal range. There was no stenosis. There was no regurgitation.    Peak velocity ratio of LVOT to aortic valve: 0.85. Valve area (Vmax): 2.67 cm^2. Indexed valve area (Vmax): 1.2 cm^2/m^2.    Peak gradient (S): 7 mm Hg.  ------------------------------------------------------------------- Aorta:  The aorta was normal, not dilated, and non-diseased.  ------------------------------------------------------------------- Mitral valve:   Doppler:  There was trivial regurgitation.  Peak gradient (D): 6 mm Hg.  ------------------------------------------------------------------- Left atrium:  The atrium was at the upper limits of normal in size.   ------------------------------------------------------------------- Right ventricle:  Pacer wire or catheter noted in right ventricle.    ------------------------------------------------------------------- Tricuspid valve:   Doppler:  There was trivial regurgitation.   ------------------------------------------------------------------- Right atrium:  The atrium was at the upper limits of normal in size.  ------------------------------------------------------------------- Pericardium:  The pericardium was normal in appearance. There was no pericardial effusion.  ------------------------------------------------------------------- Post procedure conclusions Ascending Aorta:  - The aorta was normal, not dilated, and non-diseased.  ------------------------------------------------------------------- Measurements   Left ventricle                           Value          Reference  LV ID, ED, PLAX chordal                  49.4  mm       43 - 52  LV ID, ES, PLAX chordal                  26    mm       23 - 38  LV fx shortening, PLAX chordal           47    %        >=29  LV PW thickness, ED                      10.4  mm       ----------  IVS/LV PW ratio, ED                      1.16           <=1.3  LV e&', lateral                           8.27  cm/s     ----------  LV E/e&', lateral                         14.51          ----------  LV e&', medial                            6.74  cm/s     ----------  LV E/e&', medial                          17.8           ----------  LV e&', average                           7.51  cm/s     ----------  LV E/e&', average                         15.99          ----------    Ventricular septum                       Value          Reference  IVS thickness, ED  12.1  mm       ----------    LVOT                                     Value          Reference  LVOT ID, S                               20    mm       ----------  LVOT area                                3.14  cm^2     ----------  LVOT peak velocity, S                    109   cm/s     ----------    Aortic  valve                             Value          Reference  Aortic valve peak velocity, S            128   cm/s     ----------  Aortic peak gradient, S                  7     mm Hg    ----------  Velocity ratio, peak, LVOT/AV            0.85           ----------  Aortic valve area, peak velocity         2.67  cm^2     ----------  Aortic valve area/bsa, peak              1.2   cm^2/m^2 ----------  velocity    Aorta                                    Value          Reference  Aortic root ID, ED                       34    mm       ----------    Left atrium                              Value          Reference  LA ID, A-P, ES                           44    mm       ----------  LA ID/bsa, A-P                           1.98  cm/m^2   <=2.2  LA volume, S  36.8  ml       ----------  LA volume/bsa, S                         16.6  ml/m^2   ----------  LA volume, ES, 1-p A4C                   39.3  ml       ----------  LA volume/bsa, ES, 1-p A4C               17.7  ml/m^2   ----------  LA volume, ES, 1-p A2C                   34.7  ml       ----------  LA volume/bsa, ES, 1-p A2C               15.6  ml/m^2   ----------    Mitral valve                             Value          Reference  Mitral E-wave peak velocity              120   cm/s     ----------  Mitral A-wave peak velocity              82.9  cm/s     ----------  Mitral deceleration time                 173   ms       150 - 230  Mitral peak gradient, D                  6     mm Hg    ----------  Mitral E/A ratio, peak                   1.4            ----------    Right atrium                             Value          Reference  RA ID, S-I, ES, A4C                      43.6  mm       34 - 49  RA area, ES, A4C                         15.3  cm^2     8.3 - 19.5  RA volume, ES, A/L                       42.4  ml       ----------  RA volume/bsa, ES, A/L                   19.1  ml/m^2   ----------    Right  ventricle                          Value          Reference  RV ID, ED, PLAX  37.9  mm       19 - 38  TAPSE                                    39.4  mm       ----------  RV s&', lateral, S                        13.4  cm/s     ----------    Pulmonic valve                           Value          Reference  Pulmonic valve peak velocity, S          106   cm/s     ----------  Legend: (L)  and  (H)  mark values outside specified reference range.  ------------------------------------------------------------------- Prepared and Electronically Authenticated by  Miquel Dunn, MD 2019-03-07T16:38:56  Complexity Note: Imaging results reviewed. Results shared with Mr. Pandit, using Layman's terms.                         Meds   Current Outpatient Medications:  .  aspirin 81 MG tablet, Take 81 mg by mouth daily.  , Disp: , Rfl:  .  atorvastatin (LIPITOR) 20 MG tablet, Take 1 tablet (20 mg total) by mouth daily., Disp: 90 tablet, Rfl: 0 .  cetirizine (ZYRTEC) 10 MG tablet, Take 10 mg by mouth daily. As needed, Disp: , Rfl:  .  Cholecalciferol (VITAMIN D) 2000 units CAPS, Take 1 capsule by mouth daily., Disp: , Rfl:  .  CRANBERRY PO, Take 4,200 mg by mouth daily. , Disp: , Rfl:  .  desonide (DESOWEN) 0.05 % ointment, Apply topically 2 (two) times daily. (Patient taking differently: Apply topically as needed. ), Disp: 60 g, Rfl: 1 .  docusate sodium (COLACE) 100 MG capsule, Take 100 mg by mouth 2 (two) times daily., Disp: , Rfl:  .  FLUoxetine (PROZAC) 40 MG capsule, TAKE 1 BY MOUTH DAILY, Disp: 90 capsule, Rfl: 1 .  fluticasone (FLONASE) 50 MCG/ACT nasal spray, Place 2 sprays into both nostrils daily., Disp: 48 g, Rfl: 1 .  furosemide (LASIX) 20 MG tablet, TAKE 1 BY MOUTH DAILY, Disp: 90 tablet, Rfl: 3 .  Garlic 8032 MG CAPS, Take 1 capsule by mouth daily.  , Disp: , Rfl:  .  Multiple Vitamin (MULTIVITAMIN PO), Take 1 tablet by mouth daily.  , Disp: , Rfl:  .  Naloxone  HCl (NARCAN) 4 MG/0.1ML LIQD, Spray into one nostril. Repeat with second device into other nostril after 2-3 minutes if no or minimal response., Disp: 2 each, Rfl: 0 .  naproxen (NAPROSYN) 500 MG tablet, TAKE 1 BY MOUTH TWICE DAILY WITH A MEAL, Disp: 180 tablet, Rfl: 1 .  Omega-3 Fatty Acids (FISH OIL) 1200 MG CAPS, Take 1,200 mg by mouth daily. , Disp: , Rfl:  .  omeprazole (PRILOSEC) 20 MG capsule, TAKE 1 BY MOUTH DAILY, Disp: 90 capsule, Rfl: 2 .  [START ON 11/20/2017] oxyCODONE (OXY IR/ROXICODONE) 5 MG immediate release tablet, Take 1 tablet (5 mg total) by mouth every 6 (six) hours as needed for severe pain., Disp: 120 tablet, Rfl: 0 .  [START ON 01/19/2018] oxyCODONE (OXYCONTIN) 10 mg 12 hr tablet, Take  1 tablet (10 mg total) by mouth every 12 (twelve) hours., Disp: 60 tablet, Rfl: 0 .  potassium citrate (UROCIT-K) 10 MEQ (1080 MG) SR tablet, Take 1 tablet (10 mEq total) by mouth 2 (two) times daily., Disp: 180 tablet, Rfl: 3 .  Saw Palmetto, Serenoa repens, (SAW PALMETTO PO), Take 450 mg by mouth daily. , Disp: , Rfl:  .  sildenafil (REVATIO) 20 MG tablet, 3-5 tablets daily as needed, Disp: , Rfl:  .  VITAMIN B COMPLEX-C CAPS, Take by mouth daily. , Disp: , Rfl:  .  vitamin E 1000 UNIT capsule, , Disp: , Rfl:  .  [START ON 12/20/2017] oxyCODONE (OXY IR/ROXICODONE) 5 MG immediate release tablet, Take 1 tablet (5 mg total) by mouth every 6 (six) hours as needed for severe pain., Disp: 120 tablet, Rfl: 0 .  [START ON 01/19/2018] oxyCODONE (OXY IR/ROXICODONE) 5 MG immediate release tablet, Take 1 tablet (5 mg total) by mouth every 6 (six) hours as needed for severe pain., Disp: 120 tablet, Rfl: 0 .  [START ON 12/20/2017] oxyCODONE (OXYCONTIN) 10 mg 12 hr tablet, Take 1 tablet (10 mg total) by mouth every 12 (twelve) hours., Disp: 60 tablet, Rfl: 0 .  [START ON 11/20/2017] oxyCODONE (OXYCONTIN) 10 mg 12 hr tablet, Take 1 tablet (10 mg total) by mouth every 12 (twelve) hours., Disp: 60 tablet, Rfl: 0  ROS   Constitutional: Denies any fever or chills Gastrointestinal: No reported hemesis, hematochezia, vomiting, or acute GI distress Musculoskeletal: Denies any acute onset joint swelling, redness, loss of ROM, or weakness Neurological: No reported episodes of acute onset apraxia, aphasia, dysarthria, agnosia, amnesia, paralysis, loss of coordination, or loss of consciousness  Allergies  Mr. Klemz is allergic to ciprofloxacin; metformin; penicillin v potassium; penicillins; buprenorphine hcl; and morphine and related.  [place]  Drug: Mr. Flinchum  reports that he does not use drugs. Alcohol:  reports that he does not drink alcohol. Tobacco:  reports that he has never smoked. He has never used smokeless tobacco. Medical:  has a past medical history of Anxiety, Chronic pain, Depression, History of kidney stones (April 12,2017), History of permanent cardiac pacemaker placement (05/31/2015), Hydrocele in adult (05/07/2015), Kidney stones, Migraine, Prostatitis, Sleep apnea, and Sleep apnea. Surgical: Mr. Favorite  has a past surgical history that includes Tonsillectomy (1964); Carpal tunnel release (Bilateral, 2006); Ulnar nerve repair (2008); Cervical spine surgery (2008); Rotator cuff repair (Left, 2009); pacemaker placement (2009); Lithotripsy (Bilateral, 2012); Lithotripsy (Left, 11/2015); Cardiovascular stress test (2014); and Colonoscopy (07/2007). Family: family history includes Arthritis in his father and mother; Cancer in his father and mother; Diabetes in his mother; Heart block in his father; Heart disease in his father and mother.  Constitutional Exam  General appearance: Well nourished, well developed, and well hydrated. In no apparent acute distress Vitals:   11/12/17 1229  BP: 130/75  Pulse: 65  Resp: 16  Temp: 98.2 F (36.8 C)  TempSrc: Oral  SpO2: 100%  Weight: 229 lb (103.9 kg)  Height: _0  (1.702 m)  Psych/Mental status: Alert, oriented x 3 (person, place, & time)       Eyes:  PERLA Respiratory: No evidence of acute respiratory distress  Cervical Spine Area Exam  Skin & Axial Inspection: No masses, redness, edema, swelling, or associated skin lesions Alignment: Symmetrical Functional ROM: Unrestricted ROM      Stability: No instability detected Muscle Tone/Strength: Functionally intact. No obvious neuro-muscular anomalies detected. Sensory (Neurological): Unimpaired Palpation: Non-tender  Upper Extremity (UE) Exam    Side: Right upper extremity  Side: Left upper extremity  Skin & Extremity Inspection: Skin color, temperature, and hair growth are WNL. No peripheral edema or cyanosis. No masses, redness, swelling, asymmetry, or associated skin lesions. No contractures.  Skin & Extremity Inspection: Skin color, temperature, and hair growth are WNL. No peripheral edema or cyanosis. No masses, redness, swelling, asymmetry, or associated skin lesions. No contractures.  Functional ROM: Adequate ROM          Functional ROM: Adequate ROM          Muscle Tone/Strength: Normal strength (5/5)  Muscle Tone/Strength: Normal strength (5/5)  Sensory (Neurological): Unimpaired          Sensory (Neurological): Unimpaired          Palpation: No palpable anomalies              Palpation: No palpable anomalies              Specialized Test(s): Deferred         Specialized Test(s): Deferred           Gait & Posture Assessment  Ambulation: Unassisted Gait: Relatively normal for age and body habitus Posture: WNL   Lower Extremity Exam    Side: Right lower extremity  Side: Left lower extremity  Skin & Extremity Inspection: Skin color, temperature, and hair growth are WNL. No peripheral edema or cyanosis. No masses, redness, swelling, asymmetry, or associated skin lesions. No contractures.  Skin & Extremity Inspection: Skin color, temperature, and hair growth are WNL. No peripheral edema or cyanosis. No masses, redness, swelling, asymmetry, or associated skin lesions. No  contractures.  Functional ROM: Unrestricted ROM          Functional ROM: Unrestricted ROM          Muscle Tone/Strength: Functionally intact. No obvious neuro-muscular anomalies detected.  Muscle Tone/Strength: Functionally intact. No obvious neuro-muscular anomalies detected.  Sensory (Neurological): Unimpaired  Sensory (Neurological): Unimpaired  Palpation: No palpable anomalies  Palpation: No palpable anomalies   Assessment  Primary Diagnosis & Pertinent Problem List: The primary encounter diagnosis was Cervical spondylosis with radiculopathy (Bilateral) (L>R). Diagnoses of Chronic pain of both shoulders, Primary osteoarthritis of left shoulder, Chronic pain syndrome, Opiate use (60 MME/Day), and Long term current use of opiate analgesic were also pertinent to this visit.  Status Diagnosis  Controlled Controlled Persistent 1. Cervical spondylosis with radiculopathy (Bilateral) (L>R)   2. Chronic pain of both shoulders   3. Primary osteoarthritis of left shoulder   4. Chronic pain syndrome   5. Opiate use (60 MME/Day)   6. Long term current use of opiate analgesic     Problems updated and reviewed during this visit: No problems updated. Plan of Care  Pharmacotherapy (Medications Ordered): Meds ordered this encounter  Medications  . oxyCODONE (OXYCONTIN) 10 mg 12 hr tablet    Sig: Take 1 tablet (10 mg total) by mouth every 12 (twelve) hours.    Dispense:  60 tablet    Refill:  0    Do not add this medication to the electronic "Automatic Refill" notification system. Patient may have prescription filled one day early if pharmacy is closed on scheduled refill date. Do not fill until: 01/19/2018 To last until:02/18/2018    Order Specific Question:   Supervising Provider    Answer:   Milinda Pointer [phone]  . oxyCODONE (OXY IR/ROXICODONE) 5 MG immediate release tablet    Sig: Take 1 tablet (5  mg total) by mouth every 6 (six) hours as needed for severe pain.    Dispense:  120  tablet    Refill:  0    Do not add this medication to the electronic "Automatic Refill" notification system. Patient may have prescription filled one day early if pharmacy is closed on scheduled refill date. Do not fill until: 11/20/2017 To last until:12/20/2017    Order Specific Question:   Supervising Provider    Answer:   Milinda Pointer [phone]  . oxyCODONE (OXY IR/ROXICODONE) 5 MG immediate release tablet    Sig: Take 1 tablet (5 mg total) by mouth every 6 (six) hours as needed for severe pain.    Dispense:  120 tablet    Refill:  0    Do not add this medication to the electronic "Automatic Refill" notification system. Patient may have prescription filled one day early if pharmacy is closed on scheduled refill date. Do not fill until:12/20/2017 To last until: 01/19/2018    Order Specific Question:   Supervising Provider    Answer:   Milinda Pointer [phone]  . oxyCODONE (OXY IR/ROXICODONE) 5 MG immediate release tablet    Sig: Take 1 tablet (5 mg total) by mouth every 6 (six) hours as needed for severe pain.    Dispense:  120 tablet    Refill:  0    Do not add this medication to the electronic "Automatic Refill" notification system. Patient may have prescription filled one day early if pharmacy is closed on scheduled refill date. Do not fill until: 01/19/2018 To last until: 02/18/2018    Order Specific Question:   Supervising Provider    Answer:   Milinda Pointer [phone]  . oxyCODONE (OXYCONTIN) 10 mg 12 hr tablet    Sig: Take 1 tablet (10 mg total) by mouth every 12 (twelve) hours.    Dispense:  60 tablet    Refill:  0    Do not add this medication to the electronic "Automatic Refill" notification system. Patient may have prescription filled one day early if pharmacy is closed on scheduled refill date. Do not fill until: 12/20/2017 To last until: 01/19/2018    Order Specific Question:   Supervising Provider    Answer:   Milinda Pointer [phone]  . oxyCODONE (OXYCONTIN)  10 mg 12 hr tablet    Sig: Take 1 tablet (10 mg total) by mouth every 12 (twelve) hours.    Dispense:  60 tablet    Refill:  0    Do not add this medication to the electronic "Automatic Refill" notification system. Patient may have prescription filled one day early if pharmacy is closed on scheduled refill date. Do not fill until: 11/20/2017 To last until: 12/20/2017    Order Specific Question:   Supervising Provider    Answer:   Milinda Pointer [phone]   New Prescriptions   No medications on file   Medications administered today: Tajon Moring. Obenchain had no medications administered during this visit. Lab-work, procedure(s), and/or referral(s): Orders Placed This Encounter  Procedures  . ToxASSURE Select 13 (MW), Urine   Imaging and/or referral(s): None  Interventional therapies: Planned, scheduled, and/or pending:  Not at this time. Encouraged patient to avoid any benzodiazepines.   Considering:  Complete a series of 5 Hyalgan intra-articular knee injections, bilaterally.  Diagnostic bilateral cervical facet block  Possible bilateral cervical facet radiofrequencyablation  Diagnostic left sided cervical epiduralsteroid injection  Diagnostic bilateral intra-articular knee injection Diagnostic bilateral genicular nerve blocks Possible bilateral genicular nerve radiofrequencyablation  Diagnostic bilateral sacroiliac joint block Possible bilateral sacroiliac joint radiofrequencyablation  Diagnostic bilateral intra-articular shoulder jointinjection  Diagnostic bilateral suprascapular nerve block Possible bilateral suprascapular nerve radiofrequencyablation  Diagnostic bilateral lumbar facet block Possible bilateral lumbar facet radiofrequencyablation  Diagnostic left L4-5 lumbar epidural steroid injection    Palliative PRN treatment(s):  Bilateral intra-articular Hyalgan knee injectionseries (injection #4)  Diagnostic bilateral cervical facetblock  Diagnostic  left sided cervical epiduralsteroid injection  Diagnostic bilateral intra-articular knee injection Diagnostic bilateral genicular nerve blocks Diagnostic bilateral sacroiliac joint block Diagnostic bilateral intra-articular shoulder joint injection  Diagnostic bilateral suprascapularnerve block  Diagnostic bilateral lumbar facetblock  Diagnostic left L4-5 lumbar epidural steroid injection     Provider-requested follow-up: Return in about 3 months (around 02/11/2018) for MedMgmt with Me Donella Stade Edison Pace).  Future Appointments  Date Time Provider [place]  02/11/2018 12:45 PM Vevelyn Francois, NP [facility] None  02/11/2018  2:00 PM Ria Bush, MD [facility]  08/09/2018  8:05 AM [facility] LAB [facility]  08/16/2018  2:30 PM Eustace Pen, LPN [facility]  03/30/2352  3:00 PM Ria Bush, MD [facility]  08/29/2018  2:15 PM Stoioff, Ronda Fairly, MD [facility] None   Primary Care Physician: Ria Bush, MD Location: [facility] Note by: Vevelyn Francois NP Date: 11/12/2017; Time: 3:19 PM  Pain Score Disclaimer: We use the NRS-11 scale. This is a self-reported, subjective measurement of pain severity with only modest accuracy. It is used primarily to identify changes within a particular patient. It must be understood that outpatient pain scales are significantly less accurate that those used for research, where they can be applied under ideal controlled circumstances with minimal exposure to variables. In reality, the score is likely to be a combination of pain intensity and pain affect, where pain affect describes the degree of emotional arousal or changes in action readiness caused by the sensory experience of pain. Factors such as social and work situation, setting, emotional state, anxiety levels, expectation, and prior pain experience may influence pain perception and show large inter-individual differences that may also be affected by time  variables.  Patient instructions provided during this appointment: Patient Instructions   You have been given a script for oxycodone x 3 and oxycontin x 3.  BMI Assessment: Estimated body mass index is 35.87 kg/m as calculated from the following:   Height as of this encounter: _0  (1.702 m).   Weight as of this encounter: 229 lb (103.9 kg).  BMI interpretation table: BMI level Category Range association with higher incidence of chronic pain  <18 kg/m2 Underweight   18.5-24.9 kg/m2 Ideal body weight   25-29.9 kg/m2 Overweight Increased incidence by 20%  30-34.9 kg/m2 Obese (Class I) Increased incidence by 68%  35-39.9 kg/m2 Severe obesity (Class II) Increased incidence by 136%  >40 kg/m2 Extreme obesity (Class III) Increased incidence by 254%   BMI Readings from Last 4 Encounters:  11/12/17 35.87 kg/m  10/08/17 39.31 kg/m  08/29/17 35.38 kg/m  08/14/17 35.24 kg/m   Wt Readings from Last 4 Encounters:  11/12/17 229 lb (103.9 kg)  10/08/17 229 lb (103.9 kg)  08/29/17 225 lb 14.4 oz (102.5 kg)  08/14/17 225 lb (102.1 kg)

## 2017-11-13 DIAGNOSIS — I495 Sick sinus syndrome: Secondary | ICD-10-CM | POA: Diagnosis not present

## 2017-11-14 DIAGNOSIS — G4733 Obstructive sleep apnea (adult) (pediatric): Secondary | ICD-10-CM | POA: Diagnosis not present

## 2017-11-15 DIAGNOSIS — Z950 Presence of cardiac pacemaker: Secondary | ICD-10-CM | POA: Diagnosis not present

## 2017-11-15 DIAGNOSIS — R001 Bradycardia, unspecified: Secondary | ICD-10-CM | POA: Diagnosis not present

## 2017-11-15 DIAGNOSIS — I10 Essential (primary) hypertension: Secondary | ICD-10-CM | POA: Diagnosis not present

## 2017-11-17 LAB — TOXASSURE SELECT 13 (MW), URINE

## 2017-12-05 ENCOUNTER — Inpatient Hospital Stay: Admission: RE | Admit: 2017-12-05 | Payer: Medicare Other | Source: Ambulatory Visit

## 2017-12-06 ENCOUNTER — Other Ambulatory Visit: Payer: Self-pay

## 2017-12-06 ENCOUNTER — Encounter
Admission: RE | Admit: 2017-12-06 | Discharge: 2017-12-06 | Disposition: A | Discharge status: Home / Self Care | Payer: Medicare Other | Source: Ambulatory Visit | Attending: Cardiology | Admitting: Cardiology

## 2017-12-06 ENCOUNTER — Ambulatory Visit
Admission: RE | Admit: 2017-12-06 | Discharge: 2017-12-06 | Disposition: A | Discharge status: Home / Self Care | Payer: Medicare Other | Source: Ambulatory Visit | Attending: Cardiology | Admitting: Cardiology

## 2017-12-06 DIAGNOSIS — Z881 Allergy status to other antibiotic agents status: Secondary | ICD-10-CM | POA: Diagnosis not present

## 2017-12-06 DIAGNOSIS — Z885 Allergy status to narcotic agent status: Secondary | ICD-10-CM | POA: Insufficient documentation

## 2017-12-06 DIAGNOSIS — Z950 Presence of cardiac pacemaker: Secondary | ICD-10-CM

## 2017-12-06 DIAGNOSIS — Z01812 Encounter for preprocedural laboratory examination: Secondary | ICD-10-CM | POA: Diagnosis not present

## 2017-12-06 DIAGNOSIS — Z01810 Encounter for preprocedural cardiovascular examination: Secondary | ICD-10-CM | POA: Diagnosis not present

## 2017-12-06 DIAGNOSIS — R001 Bradycardia, unspecified: Secondary | ICD-10-CM | POA: Diagnosis not present

## 2017-12-06 DIAGNOSIS — Z880 Allergy status to penicillin: Secondary | ICD-10-CM | POA: Insufficient documentation

## 2017-12-06 DIAGNOSIS — Z888 Allergy status to other drugs, medicaments and biological substances status: Secondary | ICD-10-CM | POA: Diagnosis not present

## 2017-12-06 DIAGNOSIS — Z01818 Encounter for other preprocedural examination: Secondary | ICD-10-CM | POA: Diagnosis not present

## 2017-12-06 HISTORY — DX: Presence of cardiac pacemaker: Z95.0

## 2017-12-06 HISTORY — DX: Cardiac murmur, unspecified: R01.1

## 2017-12-06 HISTORY — DX: Unspecified osteoarthritis, unspecified site: M19.90

## 2017-12-06 LAB — BASIC METABOLIC PANEL
Anion gap: 8 (ref 5–15)
BUN: 25 mg/dL — ABNORMAL HIGH (ref 6–20)
CO2: 25 mmol/L (ref 22–32)
Calcium: 9 mg/dL (ref 8.9–10.3)
Chloride: 105 mmol/L (ref 101–111)
Creatinine, Ser: 1.2 mg/dL (ref 0.61–1.24)
GFR calc Af Amer: >60 mL/min (ref >60)
GFR calc non Af Amer: >60 mL/min (ref >60)
Glucose, Bld: 108 mg/dL — ABNORMAL HIGH (ref 65–99)
Potassium: 3.8 mmol/L (ref 3.5–5.1)
Sodium: 138 mmol/L (ref 135–145)

## 2017-12-06 LAB — CBC
HCT: 41.3 % (ref 40.0–52.0)
Hemoglobin: 14.7 g/dL (ref 13.0–18.0)
MCH: 30.7 pg (ref 26.0–34.0)
MCHC: 35.7 g/dL (ref 32.0–36.0)
MCV: 85.9 fL (ref 80.0–100.0)
Platelets: 171 10*3/uL (ref 150–440)
RBC: 4.81 MIL/uL (ref 4.40–5.90)
RDW: 13.4 % (ref 11.5–14.5)
WBC: 9.4 10*3/uL (ref 3.8–10.6)

## 2017-12-06 LAB — PROTIME-INR
INR: 1.14
Prothrombin Time: 14.5 seconds (ref 11.4–15.2)

## 2017-12-06 LAB — SURGICAL PCR SCREEN
MRSA, PCR: POSITIVE — AB
Staphylococcus aureus: POSITIVE — AB

## 2017-12-06 LAB — APTT: aPTT: 32 seconds (ref 24–36)

## 2017-12-06 NOTE — Patient Instructions (Addendum)
  Your procedure is scheduled on: Thursday Dec 13, 2017 Report to Same Day Surgery 2nd floor medical mall (Medical Mall Entrance-take elevator on left to 2nd floor.  Check in with surgery information desk.) To find out your arrival time please call [phone] between 1PM - 3PM on Wednesday Dec 12, 2017  Remember: Instructions that are not followed completely may result in serious medical risk, up to and including death, or upon the discretion of your surgeon and anesthesiologist your surgery may need to be rescheduled.    _x___ 1. Do not eat food after midnight the night before your procedure. You may drink clear liquids up to 2 hours before you are scheduled to arrive at the hospital for your procedure.  Do not drink clear liquids within 2 hours of your scheduled arrival to the hospital.  Clear liquids include  --Water or Apple juice without pulp  --Clear carbohydrate beverage such as Gatorade  --Black Coffee or Clear Tea (No milk, no creamers, do not add anything to the coffee or tea)   No gum chewing or hard candies.      __x__ 2. No Alcohol for 24 hours before or after surgery.   __x__3. No Smoking or e-cigarettes for 24 prior to surgery.  Do not use any chewable tobacco products for at least 6 hour prior to surgery   ____  4. Bring all medications with you on the day of surgery if instructed.    __x__ 5. Notify your doctor if there is any change in your medical condition     (cold, fever, infections).   __x__6. On the morning of surgery brush your teeth with toothpaste and water.  You may rinse your mouth with mouth wash if you wish.  Do not swallow any toothpaste or mouthwash.   Do not wear jewelry.  Do not wear lotions, powders, deodorant, or perfumes.   Do not shave 48 hours prior to surgery.   Do not bring valuables to the hospital.    [facility] is not responsible for any belongings or valuables.   Please read over the following fact sheets that you were given:   [facility] Preparing for Surgery and or MRSA Information   _x___ Take anti-hypertensive listed below, cardiac, seizure, asthma, anti-reflux and psychiatric medicines. These include:  1. Fluoxetine/Prozac  2. Fluticasone/Flonase  3. Omeprazole/Prilosec   _x___ Use CHG Soap or sage wipes as directed on instruction sheet   _x___ Follow recommendations from Cardiologist, Pulmonologist or PCP regarding stopping Aspirin, Coumadin, Plavix ,Eliquis, Effient, or Pradaxa, and Pletal.  _x___Stop Anti-inflammatories such as Advil, Aleve, Ibuprofen, Motrin, Naproxen, Naprosyn, Goodies powders or aspirin products. OK to take Tylenol and Celebrex.   _x___ Stop supplements until after surgery (Cranberry, Garlic, Omega-3/Fish Oil, Saw Palmetto, Vitamin E). But may continue Vitamin D, Vitamin B, and multivitamin.  _x___ Bring C-Pap to the hospital.

## 2017-12-13 ENCOUNTER — Other Ambulatory Visit: Payer: Self-pay

## 2017-12-13 ENCOUNTER — Ambulatory Visit: Payer: Medicare Other | Admitting: Anesthesiology

## 2017-12-13 ENCOUNTER — Encounter
Admission: RE | Disposition: A | Discharge status: Home / Self Care | Payer: Self-pay | Source: Ambulatory Visit | Attending: Cardiology

## 2017-12-13 ENCOUNTER — Ambulatory Visit
Admission: RE | Admit: 2017-12-13 | Discharge: 2017-12-13 | Disposition: A | Discharge status: Home / Self Care | Payer: Medicare Other | Source: Ambulatory Visit | Attending: Cardiology | Admitting: Cardiology

## 2017-12-13 ENCOUNTER — Encounter: Payer: Self-pay | Admitting: *Deleted

## 2017-12-13 DIAGNOSIS — Z45010 Encounter for checking and testing of cardiac pacemaker pulse generator [battery]: Secondary | ICD-10-CM | POA: Insufficient documentation

## 2017-12-13 DIAGNOSIS — Z888 Allergy status to other drugs, medicaments and biological substances status: Secondary | ICD-10-CM | POA: Diagnosis not present

## 2017-12-13 DIAGNOSIS — I10 Essential (primary) hypertension: Secondary | ICD-10-CM | POA: Diagnosis not present

## 2017-12-13 DIAGNOSIS — Z79899 Other long term (current) drug therapy: Secondary | ICD-10-CM | POA: Diagnosis not present

## 2017-12-13 DIAGNOSIS — M1990 Unspecified osteoarthritis, unspecified site: Secondary | ICD-10-CM | POA: Diagnosis not present

## 2017-12-13 DIAGNOSIS — E669 Obesity, unspecified: Secondary | ICD-10-CM | POA: Insufficient documentation

## 2017-12-13 DIAGNOSIS — I495 Sick sinus syndrome: Secondary | ICD-10-CM | POA: Insufficient documentation

## 2017-12-13 DIAGNOSIS — F419 Anxiety disorder, unspecified: Secondary | ICD-10-CM | POA: Insufficient documentation

## 2017-12-13 DIAGNOSIS — R001 Bradycardia, unspecified: Secondary | ICD-10-CM | POA: Diagnosis not present

## 2017-12-13 DIAGNOSIS — Z880 Allergy status to penicillin: Secondary | ICD-10-CM | POA: Insufficient documentation

## 2017-12-13 DIAGNOSIS — F418 Other specified anxiety disorders: Secondary | ICD-10-CM | POA: Diagnosis not present

## 2017-12-13 DIAGNOSIS — Z791 Long term (current) use of non-steroidal anti-inflammatories (NSAID): Secondary | ICD-10-CM | POA: Insufficient documentation

## 2017-12-13 DIAGNOSIS — Z885 Allergy status to narcotic agent status: Secondary | ICD-10-CM | POA: Diagnosis not present

## 2017-12-13 DIAGNOSIS — F329 Major depressive disorder, single episode, unspecified: Secondary | ICD-10-CM | POA: Diagnosis not present

## 2017-12-13 DIAGNOSIS — Z8249 Family history of ischemic heart disease and other diseases of the circulatory system: Secondary | ICD-10-CM | POA: Diagnosis not present

## 2017-12-13 DIAGNOSIS — Z881 Allergy status to other antibiotic agents status: Secondary | ICD-10-CM | POA: Diagnosis not present

## 2017-12-13 DIAGNOSIS — Z981 Arthrodesis status: Secondary | ICD-10-CM | POA: Diagnosis not present

## 2017-12-13 DIAGNOSIS — Z79891 Long term (current) use of opiate analgesic: Secondary | ICD-10-CM | POA: Diagnosis not present

## 2017-12-13 DIAGNOSIS — Z7951 Long term (current) use of inhaled steroids: Secondary | ICD-10-CM | POA: Insufficient documentation

## 2017-12-13 DIAGNOSIS — K219 Gastro-esophageal reflux disease without esophagitis: Secondary | ICD-10-CM | POA: Diagnosis not present

## 2017-12-13 DIAGNOSIS — Z6836 Body mass index (BMI) 36.0-36.9, adult: Secondary | ICD-10-CM | POA: Insufficient documentation

## 2017-12-13 DIAGNOSIS — Z7982 Long term (current) use of aspirin: Secondary | ICD-10-CM | POA: Diagnosis not present

## 2017-12-13 DIAGNOSIS — Z8619 Personal history of other infectious and parasitic diseases: Secondary | ICD-10-CM | POA: Insufficient documentation

## 2017-12-13 HISTORY — PX: OTHER SURGICAL HISTORY: SHX169

## 2017-12-13 HISTORY — PX: IMPLANTABLE CARDIOVERTER DEFIBRILLATOR (ICD) GENERATOR CHANGE: SHX5469

## 2017-12-13 SURGERY — ICD GENERATOR CHANGE
Anesthesia: General | Laterality: Left | Wound class: Clean

## 2017-12-13 MED ORDER — PROPOFOL 10 MG/ML IV BOLUS
INTRAVENOUS | Status: AC
  Filled 2017-12-13: qty 20

## 2017-12-13 MED ORDER — CEFAZOLIN SODIUM-DEXTROSE 1-4 GM/50ML-% IV SOLN: 1.0000 g | Freq: Once | INTRAVENOUS | Status: DC

## 2017-12-13 MED ORDER — LACTATED RINGERS IV SOLN
INTRAVENOUS | Status: DC
  Administered 2017-12-13: 13:00:00 via INTRAVENOUS

## 2017-12-13 MED ORDER — SODIUM CHLORIDE FLUSH 0.9 % IV SOLN
INTRAVENOUS | Status: AC
  Filled 2017-12-13: qty 10

## 2017-12-13 MED ORDER — LIDOCAINE 1 % OPTIME INJ - NO CHARGE
INTRAMUSCULAR | Status: DC | PRN
  Administered 2017-12-13: 20 mL

## 2017-12-13 MED ORDER — GLYCOPYRROLATE 0.2 MG/ML IJ SOLN
INTRAMUSCULAR | Status: DC | PRN
  Administered 2017-12-13: 0.2 mg via INTRAVENOUS

## 2017-12-13 MED ORDER — FENTANYL CITRATE (PF) 100 MCG/2ML IJ SOLN
INTRAMUSCULAR | Status: AC
  Filled 2017-12-13: qty 2

## 2017-12-13 MED ORDER — MEPERIDINE HCL 50 MG/ML IJ SOLN: 6.2500 mg | INTRAMUSCULAR | Status: DC | PRN

## 2017-12-13 MED ORDER — VANCOMYCIN HCL IN DEXTROSE 1-5 GM/200ML-% IV SOLN
INTRAVENOUS | Status: AC
  Administered 2017-12-13: 1000 mg via INTRAVENOUS
  Filled 2017-12-13: qty 200

## 2017-12-13 MED ORDER — LIDOCAINE HCL (PF) 2 % IJ SOLN
INTRAMUSCULAR | Status: AC
  Filled 2017-12-13: qty 10

## 2017-12-13 MED ORDER — GENTAMICIN SULFATE 40 MG/ML IJ SOLN
INTRAMUSCULAR | Status: AC
  Filled 2017-12-13: qty 2

## 2017-12-13 MED ORDER — LIDOCAINE 2% (20 MG/ML) 5 ML SYRINGE
INTRAMUSCULAR | Status: DC | PRN
  Administered 2017-12-13: 50 mg via INTRAVENOUS

## 2017-12-13 MED ORDER — VANCOMYCIN HCL 250 MG PO CAPS: 250.0000 mg | ORAL_CAPSULE | Freq: Four times a day (QID) | ORAL | 0 refills | Status: DC

## 2017-12-13 MED ORDER — VANCOMYCIN HCL IN DEXTROSE 1-5 GM/200ML-% IV SOLN
1000.0000 mg | Freq: Once | INTRAVENOUS | Status: AC
  Administered 2017-12-13: 1000 mg via INTRAVENOUS

## 2017-12-13 MED ORDER — GLYCOPYRROLATE 0.2 MG/ML IJ SOLN
INTRAMUSCULAR | Status: AC
  Filled 2017-12-13: qty 1

## 2017-12-13 MED ORDER — HEPARIN SODIUM (PORCINE) 5000 UNIT/ML IJ SOLN
INTRAMUSCULAR | Status: AC
  Filled 2017-12-13: qty 1

## 2017-12-13 MED ORDER — SODIUM CHLORIDE 0.9 % IJ SOLN
INTRAMUSCULAR | Status: AC
  Filled 2017-12-13: qty 10

## 2017-12-13 MED ORDER — MIDAZOLAM HCL 5 MG/5ML IJ SOLN
INTRAMUSCULAR | Status: DC | PRN
  Administered 2017-12-13: 2 mg via INTRAVENOUS

## 2017-12-13 MED ORDER — PROMETHAZINE HCL 25 MG/ML IJ SOLN: 6.2500 mg | INTRAMUSCULAR | Status: DC | PRN

## 2017-12-13 MED ORDER — FENTANYL CITRATE (PF) 100 MCG/2ML IJ SOLN: 25.0000 ug | INTRAMUSCULAR | Status: DC | PRN

## 2017-12-13 MED ORDER — SODIUM CHLORIDE 0.9 % IV SOLN
INTRAVENOUS | Status: DC | PRN
  Administered 2017-12-13: 250 mL

## 2017-12-13 MED ORDER — PROPOFOL 500 MG/50ML IV EMUL
INTRAVENOUS | Status: DC | PRN
  Administered 2017-12-13: 75 ug/kg/min via INTRAVENOUS

## 2017-12-13 MED ORDER — MIDAZOLAM HCL 2 MG/2ML IJ SOLN
INTRAMUSCULAR | Status: AC
  Filled 2017-12-13: qty 2

## 2017-12-13 MED ORDER — SODIUM CHLORIDE 0.9 % IV SOLN
Freq: Once | INTRAVENOUS | Status: DC
  Filled 2017-12-13: qty 2

## 2017-12-13 MED ORDER — FENTANYL CITRATE (PF) 100 MCG/2ML IJ SOLN
INTRAMUSCULAR | Status: DC | PRN
  Administered 2017-12-13 (×2): 50 ug via INTRAVENOUS

## 2017-12-13 SURGICAL SUPPLY — 47 items
BAG DECANTER FOR FLEXI CONT (MISCELLANEOUS) ×2 IMPLANT
BLADE PHOTON ILLUMINATED (MISCELLANEOUS) ×2 IMPLANT
BLADE SURG SZ10 CARB STEEL (BLADE) ×2 IMPLANT
CANISTER SUCT 1200ML W/VALVE (MISCELLANEOUS) ×2 IMPLANT
CHLORAPREP W/TINT 26ML (MISCELLANEOUS) ×2 IMPLANT
COVER LIGHT HANDLE STERIS (MISCELLANEOUS) ×4 IMPLANT
DRAPE C-ARM XRAY 36X54 (DRAPES) ×1 IMPLANT
DRAPE INCISE IOBAN 66X45 STRL (DRAPES) ×2 IMPLANT
DRAPE LAPAROTOMY 77X122 PED (DRAPES) ×2 IMPLANT
DRSG TEGADERM 4X4.75 (GAUZE/BANDAGES/DRESSINGS) ×2 IMPLANT
DRSG TEGADERM 6X8 (GAUZE/BANDAGES/DRESSINGS) ×2 IMPLANT
ELECT REM PT RETURN 9FT ADLT (ELECTROSURGICAL) ×2
ELECTRODE REM PT RTRN 9FT ADLT (ELECTROSURGICAL) ×1 IMPLANT
GLOVE BIO SURGEON STRL SZ7.5 (GLOVE) ×2 IMPLANT
GLOVE BIO SURGEON STRL SZ8 (GLOVE) ×2 IMPLANT
GOWN STRL REUS W/ TWL LRG LVL3 (GOWN DISPOSABLE) ×1 IMPLANT
GOWN STRL REUS W/ TWL XL LVL3 (GOWN DISPOSABLE) ×1 IMPLANT
GOWN STRL REUS W/TWL LRG LVL3 (GOWN DISPOSABLE) ×2
GOWN STRL REUS W/TWL XL LVL3 (GOWN DISPOSABLE) ×2
GRADUATE 1200CC STRL 31836 (MISCELLANEOUS) ×2 IMPLANT
IPG PACE AZUR XT DR MRI W1DR01 (Pacemaker) IMPLANT
IV NS 1000ML (IV SOLUTION) ×2
IV NS 1000ML BAXH (IV SOLUTION) ×1 IMPLANT
KIT TURNOVER KIT A (KITS) ×2 IMPLANT
NDL FILTER BLUNT 18X1 1/2 (NEEDLE) ×1 IMPLANT
NDL HYPO 25X1 1.5 SAFETY (NEEDLE) ×1 IMPLANT
NDL SPNL 22GX3.5 QUINCKE BK (NEEDLE) ×1 IMPLANT
NEEDLE FILTER BLUNT 18X 1/2SAF (NEEDLE) ×1
NEEDLE FILTER BLUNT 18X1 1/2 (NEEDLE) ×1 IMPLANT
NEEDLE HYPO 25X1 1.5 SAFETY (NEEDLE) ×2 IMPLANT
NEEDLE SPNL 22GX3.5 QUINCKE BK (NEEDLE) ×2 IMPLANT
NS IRRIG 500ML POUR BTL (IV SOLUTION) ×2 IMPLANT
PACE AZURE XT DR MRI W1DR01 (Pacemaker) ×2 IMPLANT
PACK BASIN MINOR ARMC (MISCELLANEOUS) ×2 IMPLANT
PACK PACE INSERTION (MISCELLANEOUS) ×2 IMPLANT
PAD STATPAD (MISCELLANEOUS) ×2 IMPLANT
STRAP SAFETY 5IN WIDE (MISCELLANEOUS) ×2 IMPLANT
STRIP CLOSURE SKIN 1/2X4 (GAUZE/BANDAGES/DRESSINGS) ×2 IMPLANT
SUT SILK 2 0 SH (SUTURE) ×2 IMPLANT
SUT VIC AB 2-0 CT1 27 (SUTURE) ×2
SUT VIC AB 2-0 CT1 TAPERPNT 27 (SUTURE) ×1 IMPLANT
SUT VIC AB 2-0 CT2 27 (SUTURE) ×2 IMPLANT
SUT VIC AB 3-0 PS2 18 (SUTURE) ×2 IMPLANT
SUT VIC AB 4-0 PS2 18 (SUTURE) ×2 IMPLANT
SYR 10ML LL (SYRINGE) ×2 IMPLANT
SYR BULB IRRIG 60ML STRL (SYRINGE) ×2 IMPLANT
SYR CONTROL 10ML (SYRINGE) ×2 IMPLANT

## 2017-12-13 NOTE — Transfer of Care (Signed)
Immediate Anesthesia Transfer of Care Note  Patient: Mark [name]  Procedure(s) Performed: PACER CHANGE OUT (Left )  Patient Location: PACU  Anesthesia Type:General  Level of Consciousness: awake, alert  and oriented  Airway & Oxygen Therapy: Patient Spontanous Breathing  Post-op Assessment: Report given to RN and Post -op Vital signs reviewed and stable  Post vital signs: Reviewed  Last Vitals:  Vitals Value Taken Time  BP 124/72 12/13/2017  2:05 PM  Temp    Pulse 58 12/13/2017  2:05 PM  Resp 20 12/13/2017  2:05 PM  SpO2 98 % 12/13/2017  2:05 PM  Vitals shown include unvalidated device data.  Last Pain:  Vitals:   12/13/17 1202  TempSrc: Oral  PainSc: 3          Complications: No apparent anesthesia complications

## 2017-12-13 NOTE — Anesthesia Preprocedure Evaluation (Signed)
Anesthesia Evaluation  Patient identified by MRN, date of birth, ID band Patient awake    Reviewed: Allergy & Precautions, NPO status , Patient's Chart, lab work & pertinent test results  History of Anesthesia Complications Negative for: history of anesthetic complications  Airway Mallampati: III  TM Distance: >3 FB Neck ROM: Full    Dental no notable dental hx.    Pulmonary sleep apnea and Continuous Positive Airway Pressure Ventilation , neg COPD,    breath sounds clear to auscultation- rhonchi (-) wheezing      Cardiovascular Exercise Tolerance: Good (-) hypertension(-) CAD, (-) Past MI, (-) Cardiac Stents and (-) CABG + pacemaker (placed due to bradycardia)  Rhythm:Regular Rate:Normal - Systolic murmurs and - Diastolic murmurs    Neuro/Psych  Headaches, PSYCHIATRIC DISORDERS Anxiety Depression    GI/Hepatic negative GI ROS, Neg liver ROS,   Endo/Other  negative endocrine ROSneg diabetes  Renal/GU Renal disease: hx of nephrolithiasis.     Musculoskeletal  (+) Arthritis ,   Abdominal (+) + obese,   Peds  Hematology negative hematology ROS (+)   Anesthesia Other Findings Past Medical History: No date: Anxiety No date: Arthritis No date: Chronic pain No date: Depression No date: Heart murmur April 12,2017: History of kidney stones     Comment:  removed two kidney stones about 1 cm in size 05/31/2015: History of permanent cardiac pacemaker placement 05/07/2015: Hydrocele in adult No date: Kidney stones No date: Migraine No date: Presence of permanent cardiac pacemaker No date: Prostatitis No date: Sleep apnea No date: Sleep apnea     Comment:  getting new cpap machine   Reproductive/Obstetrics                             Anesthesia Physical Anesthesia Plan  ASA: II  Anesthesia Plan: General   Post-op Pain Management:    Induction: Intravenous  PONV Risk Score and Plan: 1  and Propofol infusion  Airway Management Planned: Natural Airway  Additional Equipment:   Intra-op Plan:   Post-operative Plan:   Informed Consent: I have reviewed the patients History and Physical, chart, labs and discussed the procedure including the risks, benefits and alternatives for the proposed anesthesia with the patient or authorized representative who has indicated his/her understanding and acceptance.   Dental advisory given  Plan Discussed with: CRNA and Anesthesiologist  Anesthesia Plan Comments:         Anesthesia Quick Evaluation

## 2017-12-13 NOTE — Op Note (Signed)
[facility] Cardiology   12/13/2017                     2:03 PM  PATIENT:  [name]    PRE-OPERATIVE DIAGNOSIS:  SINOATRIAL NODE DYSFUNCTION  POST-OPERATIVE DIAGNOSIS:  Same  PROCEDURE:  PACER CHANGE OUT  SURGEON:  Marcina Millard, MD    ANESTHESIA:     PREOPERATIVE INDICATIONS:  JOSEP LUVIANO is a  62 y.o. male with a diagnosis of SINOATRIAL NODE DYSFUNCTION who failed conservative measures and elected for surgical management.    The risks benefits and alternatives were discussed with the patient preoperatively including but not limited to the risks of infection, bleeding, cardiopulmonary complications, the need for revision surgery, among others, and the patient was willing to proceed.   OPERATIVE PROCEDURE: The patient was brought to the operating room in a fasting state.  The left pectoral region was prepped and draped in the usual sterile manner.  Anesthesia was obtained 1% lidocaine locally.  A 6 cm incision was performed in the left pectoral region.  The pacemaker generator was retrieved by electrocautery and blunt dissection.  The leads were disconnected and connected to a new MRI compatible dual-chamber rate responsive pacemaker generator ( Medtronic [id number] ) .  The pacemaker pocket was irrigated gentamicin solution.  Pacemaker generator was positioned into the pocket and the pocket was closed with 2-0 and 4-0 Vicryl, respectively.  Steri-Strips and pressure dressing were applied.  Postprocedural interrogation revealed appropriate dual-chamber atrial and ventricular sensing and pacing thresholds.  There were no periprocedural complications.

## 2017-12-13 NOTE — Anesthesia Post-op Follow-up Note (Signed)
Anesthesia QCDR form completed.        

## 2017-12-13 NOTE — Interval H&P Note (Signed)
History and Physical Interval Note:  12/13/2017 12:14 PM  Mark [name]  has presented today for surgery, with the diagnosis of SINOATRIAL NODE DYSFUNCTION  The various methods of treatment have been discussed with the patient and family. After consideration of risks, benefits and other options for treatment, the patient has consented to  Procedure(s): PACER CHANGE OUT (Left) as a surgical intervention .  The patient's history has been reviewed, patient examined, no change in status, stable for surgery.  I have reviewed the patient's chart and labs.  Questions were answered to the patient's satisfaction.     [name] [employer]

## 2017-12-13 NOTE — H&P (Signed)
Jump to Section ? Document InformationECG ResultsEncounter DetailsHistorical MedicationsLast Filed Vital SignsPatient ContactsPatient DemographicsPlan of TreatmentProceduresProgress NotesReason for ReferralReason for VisitSocial HistoryVisit Diagnoses Jacinto Halim Encounter Summary, generated on May 02, 2019May 02, 2019 Printout Information  Document Contents Document Received Date Document Source Organization  Office Visit May 02, 2019May 02, 2019 [facility]   Patient Demographics - 62 y.o. Male; born [birth year]   Patient Address Communication Language Race / Ethnicity  [address] [place], [place] [address] [phone] [facility]) [phone] (Home) [email or web address] English (Preferred) White / Not Hispanic or Latino   Reason for Referral  Procedure (Routine) Procedure (Routine)  Status Reason Specialty Diagnoses / Procedures Referred By Contact Referred To Contact  Pending Review   Diagnoses  Bradycardia    Procedures  ECG 12-lead  Fath, Glenetta Borg, MD  [address] [facility] [address]  [facility] - CARDIOLOGY  [place], [place] [address]  Phone: [phone]  Fax: [phone]         Reason for Visit   Reason Comments  3 month follow up needs ERI     Encounter Details   Date Type Department Care Team Description  11/15/2017 Office Visit [facility]  [address]  [place], [place] [address]  [phone]  Denton Ar, MD  [address] [facility] [address]  [facility] [address] - CARDIOLOGY  [place], [place] [address]  [phone]  [phone] (Fax)  Bradycardia (Primary Dx);  Essential hypertension;  Cardiac pacemaker   Social History - documented as of this encounter  Tobacco Use Types Packs/Day Years Used Date  Never Smoker      Smokeless Tobacco: Never Used      Alcohol Use Drinks/Week oz/Week Comments  Never      Sex Assigned at Birth Date Recorded  Male 06/02/2017  2:11 AM EDT   Job Start Date Occupation Media planner  Not on file Not on file Not on file   Travel History Travel Start Travel End  No recent travel history available.     Last Filed Vital Signs - documented in this encounter  Vital Sign Reading Time Taken Comments  Blood Pressure - -   Pulse - -   Temperature - -   Respiratory Rate 12 11/15/2017 2:59 PM EDT   Oxygen Saturation - -   Inhaled Oxygen Concentration - -   Weight 104.3 kg (230 lb) 11/15/2017 2:59 PM EDT   Height 170.2 cm ( ) 11/15/2017 2:59 PM EDT   Body Mass Index 36.02 11/15/2017 2:59 PM EDT    Progress Notes - documented in this encounter  Denton Ar, MD - 11/15/2017 2:45 PM EDT Formatting of this note might be different from the original.   Chief Complaint: Chief Complaint  Patient presents with  . 3 month follow up  needs ERI  Date of Service: 11/15/2017 Date of Birth: [birth year] PCP: [name], MD  History of Present Illness: Mark [name] is a 62 y.o.male patient who returns for follow-up visit. Is doing well from a cardiac standpoint. He denies weakness or fatigue syncope or presyncope. His pacemaker is functioning normally. He is at [facility]. He is device is pacing at 65 bpm. We will schedule generator change out. Is able to carry out most daily activities without difficulty. He denies orthopnea or PND. Past Medical and Surgical History  Past Medical History Past Medical History:  Diagnosis Date  . Anxiety, unspecified  . Arrhythmia  . Bradycardia  s/p dual chamber pacemaker placement  .  Depression, unspecified  . GERD (gastroesophageal reflux disease)  . History of chicken pox  . History of kidney stones  . History of shingles  . Hypertension  . Murmur, unspecified  . Obesity, unspecified   Past Surgical History He has a past surgical history that includes Endoscopic Carpal Tunnel Release (Left, 07/21/05); Endoscopic Carpal Tunnel Release (Right, 08/04/05); Insertion Dual  Chamber Pacemaker Generator (08/14/07); Joint replacement (Left, 02/24/08); Anterior fusion cervical spine (08/21/06); Tonsillectomy (1964); and Insert / replace / remove pacemaker.   Medications and Allergies  Current Medications  Current Outpatient Medications  Medication Sig Dispense Refill  . aspirin 81 MG EC tablet Take 81 mg by mouth once daily.  Marland Kitchen atorvastatin (LIPITOR) 20 MG tablet Take by mouth.  . cetirizine (ZYRTEC) 10 MG tablet Take 10 mg by mouth once daily.  . cholecalciferol (CHOLECALCIFEROL) 1,000 unit tablet Take 1,000 Units by mouth once daily. Take 2 tabs daily  . cranberry conc-ascorbic acid 4,200-20 mg Cap Take 1 tablet by mouth once daily.  Marland Kitchen FLUoxetine (PROZAC) 40 MG capsule Take 1 capsule (40 mg total) by mouth once daily 30 capsule 11  . fluticasone (FLONASE) 50 mcg/actuation nasal spray Place 2 sprays into both nostrils once daily.  . FUROsemide (LASIX) 20 MG tablet Take 20 mg by mouth once daily.  Marland Kitchen garlic 1,000 mg Cap Take 1,000 mg by mouth once daily.  . naloxone (NARCAN) 4 mg/actuation Spray into one nostril. Repeat with second device into other nostril after 2-3 minutes if no or minimal response.  . naproxen (NAPROSYN) 500 MG tablet Take 500 mg by mouth 2 (two) times daily with meals.  Marland Kitchen omeprazole (PRILOSEC) 20 MG DR capsule Take 20 mg by mouth once daily.  Marland Kitchen oxyCODONE (OXYCONTIN) 10 MG CR tablet Take 10 mg by mouth every 12 (twelve) hours.  Marland Kitchen oxyCODONE (ROXICODONE) 5 MG immediate release tablet Take 5 mg by mouth every 4 (four) hours as needed for Pain.  . potassium citrate (UROCIT-K) 10 mEq ER tablet Take by mouth.  . saw palmetto 160 MG capsule Take by mouth.  . sildenafil, antihypertensive, (REVATIO) 20 mg tablet 3-5 tablets daily as needed  . vitamin E mixed 1,000 unit Cap Take 1,000 mg by mouth once daily.   No current facility-administered medications for this visit.   Allergies: Ciprofloxacin; Metformin; Morphine sulfate; and Penicillins  Social and  Family History  Social History reports that he has never smoked. He has never used smokeless tobacco. He reports that he does not drink alcohol or use drugs.  Family History Family History  Problem Relation Age of Onset  . Osteoarthritis Father  . Arthritis Father  Rheumatoid  . Pacemaker Father  . Irregular Heart Beat (Arrhythmia) Father  . Heart disease Father  . Heart murmur Father  . Breast cancer Mother  . Diabetes type II Mother  . Heart disease Mother  . High blood pressure (Hypertension) Mother   Review of Systems  Review of Systems  Constitutional: Positive for malaise/fatigue. Negative for chills, diaphoresis, fever and weight loss.  HENT: Negative for congestion, ear discharge, hearing loss and tinnitus.  Eyes: Negative for blurred vision.  Respiratory: Negative for cough, hemoptysis, sputum production and wheezing.  Cardiovascular: Negative for chest pain, palpitations, orthopnea, claudication, leg swelling and PND.  Gastrointestinal: Negative for abdominal pain, blood in stool, constipation, diarrhea, heartburn, melena, nausea and vomiting.  Genitourinary: Negative for dysuria, frequency, hematuria and urgency.  Musculoskeletal: Negative for back pain, falls, joint pain and myalgias.  Skin: Negative  for itching and rash.  Neurological: Negative for dizziness, tingling, focal weakness, loss of consciousness, weakness and headaches.  Endo/Heme/Allergies: Negative for polydipsia. Does not bruise/bleed easily.  Psychiatric/Behavioral: Negative for depression, memory loss and substance abuse. The patient is not nervous/anxious.    Physical Examination   Vitals: Resp 12  Ht 170.2 cm ( )  Wt (!) 104.3 kg (230 lb)  BMI 36.02 kg/m  Ht:170.2 cm ( ) Wt:(!) 104.3 kg (230 lb) ZOX:WRUE surface area is 2.22 meters squared. Body mass index is 36.02 kg/m.  Wt Readings from Last 3 Encounters:  11/15/17 (!) 104.3 kg (230 lb)  08/22/17 (!) 102.1 kg (225 lb)    05/30/17 100.7 kg (222 lb)   BP Readings from Last 3 Encounters:  08/22/17 120/82  05/30/17 130/80  02/19/17 132/80  general: Caucasian male in no acute distress  LUNGS Breath Sounds: Normal Percussion: Normal  CARDIOVASCULAR JVP CV wave: no HJR: no Elevation at 90 degrees: None Carotid Pulse: normal pulsation bilaterally Bruit: None Apex: apical impulse normal  Auscultation Rhythm: normal sinus rhythm S1: normal S2: normal Clicks: no Rub: no Murmurs: no murmurs  Gallop: None ABDOMEN Liver enlargement: no Pulsatile aorta: no Ascites: no Bruits: no  EXTREMITIES Clubbing: no Edema: trace to 1+ bilateral pedal edema Pulses: peripheral pulses symmetrical Femoral Bruits: no Amputation: no SKIN Rash: no Cyanosis: no Embolic phemonenon: no Bruising: no NEURO Alert and Oriented to person, place and time: yes Non focal: yes    Assessment and Plan   62 y.o. male with  ICD-10-CM ICD-9-CM  1. Essential hypertension-blood pressure is currently stable. Will continue with current regimen and follow. Dash diet is recommended I10 401.9  2. Verdon Cummins is functioning normally. Will closely follow up. Device is at [facility] as it is pacing at 65 bpm. We will schedule generator change out. R00.1 427.89  3. Murmur-currently stable with no audible change. No clinical evidence of severe valvular disease. Pulmonary pressures are stable. R01.1 785.2  4. Low testosterone-per his primary care physician. Return in about 1 month (around 12/13/2017).  These notes generated with voice recognition software. I apologize for typographical errors.  Denton Ar, MD    Electronically signed by Denton Ar, MD at 11/15/2017 3:24 PM EDT     Plan of Treatment - documented as of this encounter  Scheduled Orders Scheduled Orders  Name Type Priority Associated Diagnoses Order Schedule  Basic Metabolic Panel (BMP) Lab Routine Bradycardia  Cardiac pacemaker   Ordered: 11/15/2017  CBC w/auto Differential (5 Part) Lab Routine Bradycardia  Cardiac pacemaker  Ordered: 11/15/2017   Procedures - documented in this encounter  Procedure Name Priority Date/Time Associated Diagnosis Comments  ECG 12-LEAD Routine 11/15/2017 3:04 PM EDT Bradycardia  Results for this procedure are in the results section.    ECG Results - documented in this encounter   ECG 12-lead (11/15/2017 3:04 PM EDT) ECG 12-lead (11/15/2017 3:04 PM EDT)  Component Value Ref Range Performed At Pathologist Signature  Vent Rate (bpm) 65  [facility] GE MUSE RESULTS   QRS Interval (msec) 174  [facility] GE MUSE RESULTS   QT Interval (msec) 514  [facility] GE MUSE RESULTS   QTc (msec) 534  [facility] GE MUSE RESULTS    ECG 12-lead (11/15/2017 3:04 PM EDT)  Specimen     ECG 12-lead (11/15/2017 3:04 PM EDT)  Narrative Performed At  This result has an attachment that is not available.  Ventricular-paced rhythm Abnormal ECG When compared with ECG of 02-Mar-2016 16:33, Electronic ventricular  pacemaker has replaced Electronic atrial pacemaker I reviewed and concur with this report. Electronically signed ZO:XWRU MD, KEN [phone]) on 11/16/2017 5:09:07 PM   [facility] GE MUSE RESULTS    ECG 12-lead (11/15/2017 3:04 PM EDT)  Performing Organization Address City/State/Zipcode Phone Number  [facility] GE MUSE RESULTS          Visit Diagnoses - documented in this encounter  Diagnosis  Bradycardia - Primary  Other specified cardiac dysrhythmias   Essential hypertension   Cardiac pacemaker  Cardiac pacemaker in situ    Historical Medications - added in this encounter  This list may reflect changes made after this encounter.  Medication Sig Dispensed Refills Start Date End Date  naloxone (NARCAN) 4 mg/actuation  Spray into one nostril. Repeat with second device into other nostril after 2-3 minutes if no or minimal response.  0 12/02/2015    Images Patient Contacts   Contact Name  Contact Address Communication Relationship to Patient  Shirley Muscat Unknown [phone] [facility]) Other, Emergency Contact   Document Information  Primary Care Provider Other Service Providers Document Coverage Dates  [name] MD (Sep. 15, 2017September 15, 2017 - Present) [phone] (Work) [phone] (Fax) [address] [place], [place] [address]   Apr. 11, 2019April 11, 2019 - Apr. 15, 2019April 15, 2019   Custodian Organization  [facility] [address] [place], [place] [address]   Encounter Providers Encounter Date  Denton Ar MD (Attending) [phone] (Work) [phone] (Fax) [phone] [facility] [address] [facility] - CARDIOLOGY [place], [place] [address]  Apr. 11, 2019April 11, 2019 - Apr. 15, 2019April 15, 2019    Show All Sections

## 2017-12-13 NOTE — Anesthesia Postprocedure Evaluation (Signed)
Anesthesia Post Note  Patient: Mark [name]  Procedure(s) Performed: PACER CHANGE OUT (Left )  Patient location during evaluation: PACU Anesthesia Type: General Level of consciousness: awake and alert and oriented Pain management: pain level controlled Vital Signs Assessment: post-procedure vital signs reviewed and stable Respiratory status: spontaneous breathing, nonlabored ventilation and respiratory function stable Cardiovascular status: blood pressure returned to baseline and stable Postop Assessment: no signs of nausea or vomiting Anesthetic complications: no     Last Vitals:  Vitals:   12/13/17 1430 12/13/17 1447  BP: 115/81 122/66  Pulse: 60 63  Resp: 13 14  Temp: 36.4 C 36.7 C  SpO2: 97% 96%    Last Pain:  Vitals:   12/13/17 1447  TempSrc: Oral  PainSc: 0-No pain                 Bentzion Dauria

## 2017-12-13 NOTE — Discharge Instructions (Signed)
May remove outer bandage, and shower on 12/14/2017.  Leave Steri-Strips on  AMBULATORY SURGERY  DISCHARGE INSTRUCTIONS   1) The drugs that you were given will stay in your system until tomorrow so for the next 24 hours you should not:  A) Drive an automobile B) Make any legal decisions C) Drink any alcoholic beverage   2) You may resume regular meals tomorrow.  Today it is better to start with liquids and gradually work up to solid foods.  You may eat anything you prefer, but it is better to start with liquids, then soup and crackers, and gradually work up to solid foods.   3) Please notify your doctor immediately if you have any unusual bleeding, trouble breathing, redness and pain at the surgery site, drainage, fever, or pain not relieved by medication.    4) Additional Instructions:        Please contact your physician with any problems or Same Day Surgery at [phone], Monday through Friday 6 am to 4 pm, or [place] at [facility] number at [phone].

## 2017-12-14 ENCOUNTER — Encounter: Payer: Self-pay | Admitting: Cardiology

## 2017-12-14 DIAGNOSIS — G4733 Obstructive sleep apnea (adult) (pediatric): Secondary | ICD-10-CM | POA: Diagnosis not present

## 2017-12-17 ENCOUNTER — Encounter: Payer: Self-pay | Admitting: Internal Medicine

## 2017-12-20 DIAGNOSIS — Z950 Presence of cardiac pacemaker: Secondary | ICD-10-CM | POA: Diagnosis not present

## 2017-12-20 DIAGNOSIS — R001 Bradycardia, unspecified: Secondary | ICD-10-CM | POA: Diagnosis not present

## 2017-12-20 DIAGNOSIS — I10 Essential (primary) hypertension: Secondary | ICD-10-CM | POA: Diagnosis not present

## 2018-01-07 ENCOUNTER — Ambulatory Visit: Payer: Medicare Other | Admitting: Internal Medicine

## 2018-01-07 ENCOUNTER — Encounter: Payer: Self-pay | Admitting: Internal Medicine

## 2018-01-07 VITALS — BP 142/86 | HR 80 | Ht 67.0 in | Wt 226.8 lb

## 2018-01-07 DIAGNOSIS — G4733 Obstructive sleep apnea (adult) (pediatric): Secondary | ICD-10-CM | POA: Diagnosis not present

## 2018-01-07 DIAGNOSIS — G4737 Central sleep apnea in conditions classified elsewhere: Secondary | ICD-10-CM

## 2018-01-07 DIAGNOSIS — F1190 Opioid use, unspecified, uncomplicated: Secondary | ICD-10-CM

## 2018-01-07 NOTE — Patient Instructions (Signed)
Continue using your bipap every night.  

## 2018-01-07 NOTE — Progress Notes (Signed)
[facility] Pulmonary Medicine Consultation      Assessment and Plan:  Obstructive sleep apnea-severe, with central sleep apnea/complex sleep apnea.  -Now with central sleep apnea seen on his recent download report, likely consistent with central sleep apnea. - Central sleep apnea may be secondary to chronic opioid use versus treatment emergent versus other causes. -We will send for a BiPAP titration, check arterial blood gas, echocardiogram.  Insomnia.  --Sleep initiation type. Was previous on [place] but eventually stopped working.  --He would like to discuss with his PCP.   Cardiac arrhythmia with history of pacemaker placement.   Anxiety.  -Sleep apnea can contribute to these above issues, therefore it is important to treat sleep apnea if present.  No orders of the defined types were placed in this encounter.  No follow-ups on file.  Date: 01/07/2018  MRN# [id number] BRENON ANTOSH 02-23-1956    [name] is a 62 y.o. old male seen in consultation for chief complaint of:    No chief complaint on file.   HPI:  The patient is a 62 year old male, recently underwent a sleep study which showed severe obstructive sleep apnea. He does not have leak issues anymore since changing his mask. He was initially having several centrals and obstructions, these have resolved adequately since changing to Bipap SV.  He does note that he has trouble falling asleep, goes to bed at 11pm but takes about 1 hour to fall asleep. He was previously on [place] but eventually stopped working.   **01/07/2018 review of compliance report 30 days as of 01/03/2018; usage greater than 4 hours is 30/30 days.  Average usage on days used is 8 hours 50 minutes.  Setting his VPAP auto max IPAP 22, minimum EPAP 13, pressure support 4.  Residual AHI is 5.8, which are mix of both obstructive and central overall this test shows excellent compliance with excellent control of obstructive/central sleep apnea. **10/08/2017  review of compliance report 30 days as of 10/04/17.  Usage greater than 4 hours is 15/30 days.  Average usage on days used is 4 hours 44 minutes.  Pressure ranges 12-20.  Median pressure is 16, 95th percentile pressure is 19, maximum pressure is 20.  Residual AHI is 13.6, central apnea index is 7.  **BiPAP titration study 10/18/2017; during the test it was noted that patient achieved at a pressure of 18/14,  AHI remains very elevated at lower pressures.  Patient prescribed  auto bilevel, minimum EPAP of 13, maximum IPAP of 22.  Pressure support of 4. **Echocardiogram 10/11/2017; EF 55%, echocardiogram otherwise normal. **ABG 10/08/2017; 7.41/40/82/25.4; normal. **Sleep study 07/03/17; Severe OSA with AHI of 42.7 **CPAP titration 07/26/17-started on AutoPap 12-20.    Blood Gas, Arterial  ECHOCARDIOGRAM COMPLETE  Bipap titration    Medication:    Current Outpatient Medications:  .  aspirin 81 MG tablet, Take 81 mg by mouth daily.  , Disp: , Rfl:  .  atorvastatin (LIPITOR) 20 MG tablet, Take 1 tablet (20 mg total) by mouth daily. (Patient taking differently: Take 20 mg by mouth at bedtime. ), Disp: 90 tablet, Rfl: 0 .  cetirizine (ZYRTEC) 10 MG tablet, Take 10 mg by mouth at bedtime. , Disp: , Rfl:  .  Cholecalciferol (VITAMIN D) 2000 units CAPS, Take 2,000 Units by mouth daily. , Disp: , Rfl:  .  CRANBERRY PO, Take 4,200 mg by mouth at bedtime. , Disp: , Rfl:  .  desonide (DESOWEN) 0.05 % ointment, Apply topically 2 (two) times  daily. (Patient taking differently: Apply 1 application topically 2 (two) times daily as needed (for rash on chin due to dryness). ), Disp: 60 g, Rfl: 1 .  docusate sodium (COLACE) 100 MG capsule, Take 100 mg by mouth 2 (two) times daily., Disp: , Rfl:  .  FLUoxetine (PROZAC) 20 MG capsule, Take 20 mg by mouth daily., Disp: , Rfl:  .  FLUoxetine (PROZAC) 40 MG capsule, TAKE 1 BY MOUTH DAILY, Disp: 90 capsule, Rfl: 1 .  fluticasone (FLONASE) 50 MCG/ACT nasal spray, Place 2  sprays into both nostrils daily., Disp: 48 g, Rfl: 1 .  furosemide (LASIX) 20 MG tablet, TAKE 1 BY MOUTH DAILY, Disp: 90 tablet, Rfl: 3 .  Garlic 1000 MG CAPS, Take 1,000 mg by mouth daily. , Disp: , Rfl:  .  Multiple Vitamin (MULTIVITAMIN PO), Take 1 tablet by mouth daily.  , Disp: , Rfl:  .  Naloxone HCl (NARCAN) 4 MG/0.1ML LIQD, Spray into one nostril. Repeat with second device into other nostril after 2-3 minutes if no or minimal response., Disp: 2 each, Rfl: 0 .  naproxen (NAPROSYN) 500 MG tablet, TAKE 1 BY MOUTH TWICE DAILY WITH A MEAL, Disp: 180 tablet, Rfl: 1 .  Omega-3 1000 MG CAPS, Take 1,000 mg by mouth at bedtime., Disp: , Rfl:  .  omeprazole (PRILOSEC) 20 MG capsule, TAKE 1 BY MOUTH DAILY, Disp: 90 capsule, Rfl: 2 .  oxyCODONE (OXY IR/ROXICODONE) 5 MG immediate release tablet, Take 1 tablet (5 mg total) by mouth every 6 (six) hours as needed for severe pain., Disp: 120 tablet, Rfl: 0 .  potassium citrate (UROCIT-K) 10 MEQ (1080 MG) SR tablet, Take 1 tablet (10 mEq total) by mouth 2 (two) times daily., Disp: 180 tablet, Rfl: 3 .  Saw Palmetto, Serenoa repens, (SAW PALMETTO PO), Take 450 mg by mouth 2 (two) times daily. , Disp: , Rfl:  .  sildenafil (REVATIO) 20 MG tablet, Take 60-100 mg by mouth daily as needed (for erectile dysfunction.)., Disp: , Rfl:  .  vancomycin (VANCOCIN HCL) 250 MG capsule, Take 1 capsule (250 mg total) by mouth 4 (four) times daily., Disp: 28 capsule, Rfl: 0 .  VITAMIN B COMPLEX-C CAPS, Take by mouth daily. , Disp: , Rfl:  .  vitamin E 1000 UNIT capsule, Take 1,000 Units by mouth daily. , Disp: , Rfl:    Allergies:  Ciprofloxacin; Metformin; Buprenorphine hcl; Morphine and related; and Penicillins  Review of Systems: Gen:  Denies  fever, sweats, chills HEENT: Denies blurred vision, double vision. bleeds, sore throat Cvc:  No dizziness, chest pain. Resp:   Denies cough or sputum production, shortness of breath Gi: Denies swallowing difficulty, stomach  pain. Gu:  Denies bladder incontinence, burning urine Ext:   No Joint pain, stiffness. Skin: No skin rash,  hives  Endoc:  No polyuria, polydipsia. Psych: No depression, insomnia. Other:  All other systems were reviewed with the patient and were negative other that what is mentioned in the HPI.   Physical Examination:   VS: There were no vitals taken for this visit.  General Appearance: No distress  Neuro:without focal findings,  speech normal,  HEENT: PERRLA, EOM intact.  Mallampati 4 Pulmonary: normal breath sounds, No wheezing.  CardiovascularNormal S1,S2.  No m/r/g.   Abdomen: Benign, Soft, non-tender. Renal:  No costovertebral tenderness  GU:  No performed at this time. Endoc: No evident thyromegaly, no signs of acromegaly. Skin:   warm, no rashes, no ecchymosis  Extremities: normal, no cyanosis,  clubbing.  Other findings:    LABORATORY PANEL:   CBC No results for input(s): WBC, HGB, HCT, PLT in the last 168 hours. ------------------------------------------------------------------------------------------------------------------  Chemistries  No results for input(s): NA, K, CL, CO2, GLUCOSE, BUN, CREATININE, CALCIUM, MG, AST, ALT, ALKPHOS, BILITOT in the last 168 hours.  Invalid input(s): GFRCGP ------------------------------------------------------------------------------------------------------------------  Cardiac Enzymes No results for input(s): TROPONINI in the last 168 hours. ------------------------------------------------------------  RADIOLOGY:  No results found.     Thank  you for the consultation and for allowing [facility] to assist in the care of your patient. Our recommendations are noted above.  Please contact us if we can be of further service.   Wells Guileseep Brysun Eschmann, MD.  Board Certified in Internal Medicine, Pulmonary Medicine, Critical Care Medicine, and Sleep Medicine.  [place] [facility] Office  Number: [phone]  Santiago Gladavid Kasa, M.D.  Billy Fischeravid Simonds, M.D  01/07/2018

## 2018-01-14 DIAGNOSIS — G4733 Obstructive sleep apnea (adult) (pediatric): Secondary | ICD-10-CM | POA: Diagnosis not present

## 2018-02-11 ENCOUNTER — Ambulatory Visit: Payer: Medicare Other | Attending: Nurse Practitioner | Admitting: Nurse Practitioner

## 2018-02-11 ENCOUNTER — Ambulatory Visit: Payer: Medicare Other | Admitting: Family Medicine

## 2018-02-11 ENCOUNTER — Encounter: Payer: Self-pay | Admitting: Nurse Practitioner

## 2018-02-11 ENCOUNTER — Other Ambulatory Visit: Payer: Self-pay

## 2018-02-11 ENCOUNTER — Encounter: Payer: Self-pay | Admitting: Family Medicine

## 2018-02-11 VITALS — BP 140/78 | HR 81 | Temp 98.3°F | Ht 67.0 in | Wt 228.0 lb

## 2018-02-11 VITALS — BP 158/86 | HR 71 | Temp 98.2°F | Resp 16 | Ht 68.0 in | Wt 225.0 lb

## 2018-02-11 DIAGNOSIS — Z283 Underimmunization status: Secondary | ICD-10-CM

## 2018-02-11 DIAGNOSIS — M170 Bilateral primary osteoarthritis of knee: Secondary | ICD-10-CM | POA: Diagnosis not present

## 2018-02-11 DIAGNOSIS — M19012 Primary osteoarthritis, left shoulder: Secondary | ICD-10-CM | POA: Insufficient documentation

## 2018-02-11 DIAGNOSIS — M25512 Pain in left shoulder: Secondary | ICD-10-CM | POA: Diagnosis not present

## 2018-02-11 DIAGNOSIS — G4700 Insomnia, unspecified: Secondary | ICD-10-CM | POA: Diagnosis not present

## 2018-02-11 DIAGNOSIS — Z79891 Long term (current) use of opiate analgesic: Secondary | ICD-10-CM

## 2018-02-11 DIAGNOSIS — N401 Enlarged prostate with lower urinary tract symptoms: Secondary | ICD-10-CM | POA: Diagnosis not present

## 2018-02-11 DIAGNOSIS — M47812 Spondylosis without myelopathy or radiculopathy, cervical region: Secondary | ICD-10-CM | POA: Diagnosis not present

## 2018-02-11 DIAGNOSIS — N200 Calculus of kidney: Secondary | ICD-10-CM | POA: Diagnosis not present

## 2018-02-11 DIAGNOSIS — G4733 Obstructive sleep apnea (adult) (pediatric): Secondary | ICD-10-CM

## 2018-02-11 DIAGNOSIS — G2581 Restless legs syndrome: Secondary | ICD-10-CM | POA: Insufficient documentation

## 2018-02-11 DIAGNOSIS — E785 Hyperlipidemia, unspecified: Secondary | ICD-10-CM | POA: Diagnosis not present

## 2018-02-11 DIAGNOSIS — G894 Chronic pain syndrome: Secondary | ICD-10-CM | POA: Diagnosis not present

## 2018-02-11 DIAGNOSIS — R5382 Chronic fatigue, unspecified: Secondary | ICD-10-CM | POA: Insufficient documentation

## 2018-02-11 DIAGNOSIS — R51 Headache: Secondary | ICD-10-CM | POA: Diagnosis not present

## 2018-02-11 DIAGNOSIS — F331 Major depressive disorder, recurrent, moderate: Secondary | ICD-10-CM

## 2018-02-11 DIAGNOSIS — G8929 Other chronic pain: Secondary | ICD-10-CM | POA: Diagnosis not present

## 2018-02-11 DIAGNOSIS — F411 Generalized anxiety disorder: Secondary | ICD-10-CM | POA: Diagnosis not present

## 2018-02-11 DIAGNOSIS — G4486 Cervicogenic headache: Secondary | ICD-10-CM

## 2018-02-11 DIAGNOSIS — Z2839 Other underimmunization status: Secondary | ICD-10-CM

## 2018-02-11 DIAGNOSIS — M25511 Pain in right shoulder: Secondary | ICD-10-CM | POA: Diagnosis not present

## 2018-02-11 DIAGNOSIS — M4722 Other spondylosis with radiculopathy, cervical region: Secondary | ICD-10-CM

## 2018-02-11 MED ORDER — OXYCODONE HCL 5 MG PO TABS: 5.0000 mg | ORAL_TABLET | Freq: Four times a day (QID) | ORAL | 0 refills | Status: DC | PRN

## 2018-02-11 MED ORDER — FLUOXETINE HCL 20 MG PO CAPS: 20.0000 mg | ORAL_CAPSULE | Freq: Every day | ORAL | 1 refills | Status: DC

## 2018-02-11 MED ORDER — ROPINIROLE HCL 0.25 MG PO TABS: 0.2500 mg | ORAL_TABLET | Freq: Every day | ORAL | 1 refills | Status: DC

## 2018-02-11 MED ORDER — OXYCODONE HCL ER 10 MG PO T12A
10.0000 mg | EXTENDED_RELEASE_TABLET | Freq: Two times a day (BID) | ORAL | 0 refills | Status: DC
Start: 2018-04-19 — End: 2018-05-14

## 2018-02-11 MED ORDER — FLUOXETINE HCL 40 MG PO CAPS: 40.0000 mg | ORAL_CAPSULE | Freq: Every day | ORAL | 0 refills | Status: DC

## 2018-02-11 MED ORDER — OXYCODONE HCL ER 10 MG PO T12A: 10.0000 mg | EXTENDED_RELEASE_TABLET | Freq: Two times a day (BID) | ORAL | 0 refills | Status: DC

## 2018-02-11 NOTE — Assessment & Plan Note (Signed)
Stable period on prozac 60mg  daily. Has tapered off klonopin.

## 2018-02-11 NOTE — Assessment & Plan Note (Signed)
Describes RLS type symptoms - will treat and re evaluate symptoms.

## 2018-02-11 NOTE — Patient Instructions (Addendum)
____________________________________________________________________________________________  Appointment Policy Summary  It is our goal and responsibility to provide the medical community with assistance in the evaluation and management of patients with chronic pain. Unfortunately our resources are limited. Because we do not have an unlimited amount of time, or available appointments, we are required to closely monitor and manage their use. The following rules exist to maximize their use:  Patient's responsibilities: 1. Punctuality:  At what time should I arrive? You should be physically present in our office 30 minutes before your scheduled appointment. Your scheduled appointment is with your assigned healthcare provider. However, it takes 5-10 minutes to be "checked-in", and another 15 minutes for the nurses to do the admission. If you arrive to our office at the time you were given for your appointment, you will end up being at least 20-25 minutes late to your appointment with the provider. 2. Tardiness:  What happens if I arrive only a few minutes after my scheduled appointment time? You will need to reschedule your appointment. The cutoff is your appointment time. This is why it is so important that you arrive at least 30 minutes before that appointment. If you have an appointment scheduled for 10:00 AM and you arrive at 10:01, you will be required to reschedule your appointment.  3. Plan ahead:  Always assume that you will encounter traffic on your way in. Plan for it. If you are dependent on a driver, make sure they understand these rules and the need to arrive early. 4. Other appointments and responsibilities:  Avoid scheduling any other appointments before or after your pain clinic appointments.  5. Be prepared:  Write down everything that you need to discuss with your healthcare provider and give this information to the admitting nurse. Write down the medications that you will need  refilled. Bring your pills and bottles (even the empty ones), to all of your appointments, except for those where a procedure is scheduled. 6. No children or pets:  Find someone to take care of them. It is not appropriate to bring them in. 7. Scheduling changes:  We request "advanced notification" of any changes or cancellations. 8. Advanced notification:  Defined as a time period of more than 24 hours prior to the originally scheduled appointment. This allows for the appointment to be offered to other patients. 9. Rescheduling:  When a visit is rescheduled, it will require the cancellation of the original appointment. For this reason they both fall within the category of "Cancellations".  10. Cancellations:  They require advanced notification. Any cancellation less than 24 hours before the  appointment will be recorded as a "No Show". 11. No Show:  Defined as an unkept appointment where the patient failed to notify or declare to the practice their intention or inability to keep the appointment.  Corrective process for repeat offenders:  1. Tardiness: Three (3) episodes of rescheduling due to late arrivals will be recorded as one (1) "No Show". 2. Cancellation or reschedule: Three (3) cancellations or rescheduling will be recorded as one (1) "No Show". 3. "No Shows": Three (3) "No Shows" within a 12 month period will result in discharge from the practice. ____________________________________________________________________________________________  ____________________________________________________________________________________________  Pain Scale  Introduction: The pain score used by this practice is the Verbal Numerical Rating Scale (VNRS-11). This is an 11-point scale. It is for adults and children 10 years or older. There are significant differences in how the pain score is reported, used, and applied. Forget everything you learned in the past and learn  this scoring system.  General  Information: The scale should reflect your current level of pain. Unless you are specifically asked for the level of your worst pain, or your average pain. If you are asked for one of these two, then it should be understood that it is over the past 24 hours.  Basic Activities of Daily Living (ADL): Personal hygiene, dressing, eating, transferring, and using restroom.  Instructions: Most patients tend to report their level of pain as a combination of two factors, their physical pain and their psychosocial pain. This last one is also known as suffering and it is reflection of how physical pain affects you socially and psychologically. From now on, report them separately. From this point on, when asked to report your pain level, report only your physical pain. Use the following table for reference.  Pain Clinic Pain Levels (0-5/10)  Pain Level Score  Description  No Pain 0   Mild pain 1 Nagging, annoying, but does not interfere with basic activities of daily living (ADL). Patients are able to eat, bathe, get dressed, toileting (being able to get on and off the toilet and perform personal hygiene functions), transfer (move in and out of bed or a chair without assistance), and maintain continence (able to control bladder and bowel functions). Blood pressure and heart rate are unaffected. A normal heart rate for a healthy adult ranges from 60 to 100 bpm (beats per minute).   Mild to moderate pain 2 Noticeable and distracting. Impossible to hide from other people. More frequent flare-ups. Still possible to adapt and function close to normal. It can be very annoying and may have occasional stronger flare-ups. With discipline, patients may get used to it and adapt.   Moderate pain 3 Interferes significantly with activities of daily living (ADL). It becomes difficult to feed, bathe, get dressed, get on and off the toilet or to perform personal hygiene functions. Difficult to get in and out of bed or a chair  without assistance. Very distracting. With effort, it can be ignored when deeply involved in activities.   Moderately severe pain 4 Impossible to ignore for more than a few minutes. With effort, patients may still be able to manage work or participate in some social activities. Very difficult to concentrate. Signs of autonomic nervous system discharge are evident: dilated pupils (mydriasis); mild sweating (diaphoresis); sleep interference. Heart rate becomes elevated (>115 bpm). Diastolic blood pressure (lower number) rises above 100 mmHg. Patients find relief in laying down and not moving.   Severe pain 5 Intense and extremely unpleasant. Associated with frowning face and frequent crying. Pain overwhelms the senses.  Ability to do any activity or maintain social relationships becomes significantly limited. Conversation becomes difficult. Pacing back and forth is common, as getting into a comfortable position is nearly impossible. Pain wakes you up from deep sleep. Physical signs will be obvious: pupillary dilation; increased sweating; goosebumps; brisk reflexes; cold, clammy hands and feet; nausea, vomiting or dry heaves; loss of appetite; significant sleep disturbance with inability to fall asleep or to remain asleep. When persistent, significant weight loss is observed due to the complete loss of appetite and sleep deprivation.  Blood pressure and heart rate becomes significantly elevated. Caution: If elevated blood pressure triggers a pounding headache associated with blurred vision, then the patient should immediately seek attention at an urgent or emergency care unit, as these may be signs of an impending stroke.    Emergency Department Pain Levels (6-10/10)  Emergency Room Pain 6 Severely  limiting. Requires emergency care and should not be seen or managed at an outpatient pain management facility. Communication becomes difficult and requires great effort. Assistance to reach the emergency department  may be required. Facial flushing and profuse sweating along with potentially dangerous increases in heart rate and blood pressure will be evident.   Distressing pain 7 Self-care is very difficult. Assistance is required to transport, or use restroom. Assistance to reach the emergency department will be required. Tasks requiring coordination, such as bathing and getting dressed become very difficult.   Disabling pain 8 Self-care is no longer possible. At this level, pain is disabling. The individual is unable to do even the most basic activities such as walking, eating, bathing, dressing, transferring to a bed, or toileting. Fine motor skills are lost. It is difficult to think clearly.   Incapacitating pain 9 Pain becomes incapacitating. Thought processing is no longer possible. Difficult to remember your own name. Control of movement and coordination are lost.   The worst pain imaginable 10 At this level, most patients pass out from pain. When this level is reached, collapse of the autonomic nervous system occurs, leading to a sudden drop in blood pressure and heart rate. This in turn results in a temporary and dramatic drop in blood flow to the brain, leading to a loss of consciousness. Fainting is one of the bodys self defense mechanisms. Passing out puts the brain in a calmed state and causes it to shut down for a while, in order to begin the healing process.    Summary: 1. Refer to this scale when providing [place] with your pain level. 2. Be accurate and careful when reporting your pain level. This will help with your care. 3. Over-reporting your pain level will lead to loss of credibility. 4. Even a level of 1/10 means that there is pain and will be treated at our facility. 5. High, inaccurate reporting will be documented as Symptom Exaggeration, leading to loss of credibility and suspicions of possible secondary gains such as obtaining more narcotics, or wanting to appear disabled, for  fraudulent reasons. 6. Only pain levels of 5 or below will be seen at our facility. 7. Pain levels of 6 and above will be sent to the Emergency Department and the appointment cancelled. ____________________________________________________________________________________________   BMI Assessment: Estimated body mass index is 34.21 kg/m as calculated from the following:   Height as of this encounter: 5\' 8"  (1.727 m).   Weight as of this encounter: 225 lb (102.1 kg).  BMI interpretation table: BMI level Category Range association with higher incidence of chronic pain  <18 kg/m2 Underweight   18.5-24.9 kg/m2 Ideal body weight   25-29.9 kg/m2 Overweight Increased incidence by 20%  30-34.9 kg/m2 Obese (Class I) Increased incidence by 68%  35-39.9 kg/m2 Severe obesity (Class II) Increased incidence by 136%  >40 kg/m2 Extreme obesity (Class III) Increased incidence by 254%   Patient's current BMI Ideal Body weight  Body mass index is 34.21 kg/m. Ideal body weight: 68.4 kg (150 lb 12.7 oz) Adjusted ideal body weight: 81.9 kg (180 lb 7.6 oz)   BMI Readings from Last 4 Encounters:  02/11/18 34.21 kg/m  01/07/18 35.52 kg/m  12/13/17 36.02 kg/m  12/06/17 36.02 kg/m   Wt Readings from Last 4 Encounters:  02/11/18 225 lb (102.1 kg)  01/07/18 226 lb 12.8 oz (102.9 kg)  12/13/17 230 lb (104.3 kg)  12/06/17 230 lb (104.3 kg)   ____________________________________________________________________________________________  Drug Holidays (Rapid)  Definitions Tolerance: defined  as the progressively decreased responsiveness to a drug. Occurs when the drug is used repeatedly and the body adapts to the continued presence of the drug. As a result, a larger dose of the drug is needed to achieve the effect originally obtained by a smaller dose. It is thought to be due to the formation of excess opioid receptors.  Drug Holiday: is when a patient stops taking a medication(s) for a period of time;  anywhere from a few days to several weeks.  Withdrawals: refers to the wide range of symptoms that occur after stopping or dramatically reducing opiate drugs after heavy and prolonged use. Withdrawal symptoms do not occur to patients that use low dose opioids, or those who take the medication sporadically. Contrary to benzodiazepine (example: Valium, Xanax, etc.) or alcohol withdrawals (Delirium Tremens), opioid withdrawals are not lethal. Withdrawals are the physical manifestation of the body getting rid of the excess receptors.  Purpose To eliminate tolerance.  Duration of Holiday 14 consecutive days. (2 weeks)  Expected Symptoms Early symptoms of withdrawal include:  Agitation  Anxiety  Muscle aches  Increased tearing  Insomnia  Runny nose  Sweating  Yawning  Late symptoms of withdrawal include:  Abdominal cramping  Diarrhea  Dilated pupils  Goose bumps  Nausea  Vomiting  Opioid withdrawal reactions are very uncomfortable but are not life-threatening. Symptoms usually start within 12 hours of last opioid dose and within 30 hours of last methadone exposure.  Duration of Symptoms 48 to 72 hours for short acting medications and 2 to 14 days for methadone.  Treatment  Clonidine (Catapres) or tizanidine (Zanaflex) for agitation, sweating, tearing, runny nose.  Promethazine (Phenergan) for nausea, vomiting.  NSAIDs for pain.  Benefits  Improved effectiveness of opioids.  Decreased opioid dose needed to achieve benefits.  Improved pain with lesser dose. ____________________________________________________________________________________________

## 2018-02-11 NOTE — Patient Instructions (Addendum)
Labs today Trial requip restless leg syndrome medicine 1 start with 1 tablet at night time 1-3 hours before bedtime, after 3-4 days may double up on dose if no improvement.  You are eligible for MMR vaccine - but check with insurance to see coverage.

## 2018-02-11 NOTE — Assessment & Plan Note (Signed)
Describes typical symptoms of this - will check iron panel and trial requip at night. Start 0.25mg  nightly and increase to 0.5mg  if not effective. Update with effect after 1-2 wks.

## 2018-02-11 NOTE — Progress Notes (Signed)
BP 140/78 (BP Location: Left Arm, Patient Position: Sitting, Cuff Size: Normal)   Pulse 81   Temp 98.3 F (36.8 C) (Oral)   Ht 5' 7"  (1.702 m)   Wt 228 lb (103.4 kg)   SpO2 98%   BMI 35.71 kg/m    CC: 6 mo f/u visit Subjective:    Patient ID: Mark [name], male    DOB: 1956/06/24, 62 y.o.   MRN: [id number]  HPI: Mark [name] is a 62 y.o. male presenting on 02/11/2018 for 6 mo follow up (Requests rx for gabapentin for sleep. This was suggested to pt.)   Chronic pain followed by pain management Dr Dossie Arbour - on oxycodone ER and oxycodone IR. Marland Kitchen   Chronic severe OSA with central sleep apnea/complex sleep apnea - CPAP started several months ago - this was changed to BiPAP Ashby Dawes). Having trouble falling asleep  Main concern today is trouble initiating and maintaining sleep - describes "restless sensation" in legs that makes it difficult for him to fall asleep. Feels the need to move his legs for relief. Longstanding history of insomnia - prior on ambien 7.25-12.5 for insomnia - initially effective, then lost effect.   Doing well with 5m prozac total. Unable to tolerate titration. Has been able to titrate off clonazepam. Felt clonazepam was worsening depression.   lipitor recently started by cardiology.   Recently MRSA positive - treated with abx perioperatively for PPM replacement.  Relevant past medical, surgical, family and social history reviewed and updated as indicated. Interim medical history since our last visit reviewed. Allergies and medications reviewed and updated. Outpatient Medications Prior to Visit  Medication Sig Dispense Refill  . aspirin 81 MG tablet Take 81 mg by mouth daily.      .Marland Kitchenatorvastatin (LIPITOR) 20 MG tablet Take 1 tablet (20 mg total) by mouth daily. (Patient taking differently: Take 20 mg by mouth at bedtime. ) 90 tablet 0  . cetirizine (ZYRTEC) 10 MG tablet Take 10 mg by mouth at bedtime.     . Cholecalciferol (VITAMIN D) 2000 units CAPS Take  2,000 Units by mouth daily.     .Marland KitchenCRANBERRY PO Take 4,200 mg by mouth at bedtime.     .Marland Kitchendesonide (DESOWEN) 0.05 % ointment Apply topically 2 (two) times daily. (Patient taking differently: Apply 1 application topically 2 (two) times daily as needed (for rash on chin due to dryness). ) 60 g 1  . docusate sodium (COLACE) 100 MG capsule Take 100 mg by mouth 2 (two) times daily.    . fluticasone (FLONASE) 50 MCG/ACT nasal spray Place 2 sprays into both nostrils daily. 48 g 1  . furosemide (LASIX) 20 MG tablet TAKE 1 BY MOUTH DAILY 90 tablet 3  . Garlic 13893MG CAPS Take 1,000 mg by mouth daily.     . Multiple Vitamin (MULTIVITAMIN PO) Take 1 tablet by mouth daily.      . Naloxone HCl (NARCAN) 4 MG/0.1ML LIQD Spray into one nostril. Repeat with second device into other nostril after 2-3 minutes if no or minimal response. 2 each 0  . naproxen (NAPROSYN) 500 MG tablet TAKE 1 BY MOUTH TWICE DAILY WITH A MEAL 180 tablet 1  . Omega-3 1000 MG CAPS Take 1,000 mg by mouth at bedtime.    .Marland Kitchenomeprazole (PRILOSEC) 20 MG capsule TAKE 1 BY MOUTH DAILY 90 capsule 2  . [START ON 04/19/2018] oxyCODONE (OXY IR/ROXICODONE) 5 MG immediate release tablet Take 1 tablet (5 mg total) by mouth  every 6 (six) hours as needed for severe pain. 120 tablet 0  . [START ON 03/20/2018] oxyCODONE (OXY IR/ROXICODONE) 5 MG immediate release tablet Take 1 tablet (5 mg total) by mouth every 6 (six) hours as needed for severe pain. 120 tablet 0  . [START ON 02/18/2018] oxyCODONE (OXY IR/ROXICODONE) 5 MG immediate release tablet Take 1 tablet (5 mg total) by mouth every 6 (six) hours as needed for severe pain. 120 tablet 0  . [START ON 04/19/2018] oxyCODONE (OXYCONTIN) 10 mg 12 hr tablet Take 1 tablet (10 mg total) by mouth every 12 (twelve) hours. 60 tablet 0  . [START ON 03/20/2018] oxyCODONE (OXYCONTIN) 10 mg 12 hr tablet Take 1 tablet (10 mg total) by mouth every 12 (twelve) hours. 60 tablet 0  . [START ON 02/18/2018] oxyCODONE (OXYCONTIN) 10 mg  12 hr tablet Take 1 tablet (10 mg total) by mouth every 12 (twelve) hours. 60 tablet 0  . potassium citrate (UROCIT-K) 10 MEQ (1080 MG) SR tablet Take 1 tablet (10 mEq total) by mouth 2 (two) times daily. 180 tablet 3  . Saw Palmetto, Serenoa repens, (SAW PALMETTO PO) Take 450 mg by mouth 2 (two) times daily.     . sildenafil (REVATIO) 20 MG tablet Take 60-100 mg by mouth daily as needed (for erectile dysfunction.).    Marland Kitchen VITAMIN B COMPLEX-C CAPS Take by mouth daily.     . vitamin E 1000 UNIT capsule Take 1,000 Units by mouth daily.     Marland Kitchen FLUoxetine (PROZAC) 20 MG capsule Take 20 mg by mouth daily.    Marland Kitchen FLUoxetine (PROZAC) 40 MG capsule TAKE 1 BY MOUTH DAILY 90 capsule 1   No facility-administered medications prior to visit.      Per HPI unless specifically indicated in ROS section below Review of Systems     Objective:    BP 140/78 (BP Location: Left Arm, Patient Position: Sitting, Cuff Size: Normal)   Pulse 81   Temp 98.3 F (36.8 C) (Oral)   Ht 5' 7"  (1.702 m)   Wt 228 lb (103.4 kg)   SpO2 98%   BMI 35.71 kg/m   Wt Readings from Last 3 Encounters:  02/11/18 228 lb (103.4 kg)  02/11/18 225 lb (102.1 kg)  01/07/18 226 lb 12.8 oz (102.9 kg)    Physical Exam  Constitutional: He appears well-developed. No distress.  HENT:  Mouth/Throat: Oropharynx is clear and moist. No oropharyngeal exudate.  Cardiovascular: Normal rate, regular rhythm and normal heart sounds.  No murmur heard. Pulmonary/Chest: Effort normal and breath sounds normal. No respiratory distress. He has no wheezes. He has no rales.  Musculoskeletal: He exhibits edema (1+ nonpitting dependent edema).  Nursing note and vitals reviewed.  Results for orders placed or performed during the hospital encounter of 12/06/17  Surgical pcr screen  Result Value Ref Range   MRSA, PCR POSITIVE (A) NEGATIVE   Staphylococcus aureus POSITIVE (A) NEGATIVE  CBC  Result Value Ref Range   WBC 9.4 3.8 - 10.6 K/uL   RBC 4.81 4.40  - 5.90 MIL/uL   Hemoglobin 14.7 13.0 - 18.0 g/dL   HCT 41.3 40.0 - 52.0 %   MCV 85.9 80.0 - 100.0 fL   MCH 30.7 26.0 - 34.0 pg   MCHC 35.7 32.0 - 36.0 g/dL   RDW 13.4 11.5 - 14.5 %   Platelets 171 150 - 440 K/uL  Basic metabolic panel  Result Value Ref Range   Sodium 138 135 - 145 mmol/L  Potassium 3.8 3.5 - 5.1 mmol/L   Chloride 105 101 - 111 mmol/L   CO2 25 22 - 32 mmol/L   Glucose, Bld 108 (H) 65 - 99 mg/dL   BUN 25 (H) 6 - 20 mg/dL   Creatinine, Ser 1.20 0.61 - 1.24 mg/dL   Calcium 9.0 8.9 - 10.3 mg/dL   GFR calc non Af Amer >60 >60 mL/min   GFR calc Af Amer >60 >60 mL/min   Anion gap 8 5 - 15  Protime-INR  Result Value Ref Range   Prothrombin Time 14.5 11.4 - 15.2 seconds   INR 1.14   APTT  Result Value Ref Range   aPTT 32 24 - 36 seconds   Depression screen [facility] 2/9 02/11/2018 02/11/2018 11/12/2017 08/14/2017 08/13/2017  Decreased Interest 1 0 0 0 0  Down, Depressed, Hopeless 0 0 0 0 1  PHQ - 2 Score 1 0 0 0 1  Altered sleeping 3 - - - 2  Tired, decreased energy 2 - - - 2  Change in appetite 0 - - - 1  Feeling bad or failure about yourself  0 - - - 0  Trouble concentrating 0 - - - 0  Moving slowly or fidgety/restless 0 - - - 0  Suicidal thoughts 0 - - - 0  PHQ-9 Score 6 - - - 6  Difficult doing work/chores - - - - Somewhat difficult   GAD 7 : Generalized Anxiety Score 02/11/2018  Nervous, Anxious, on Edge 1  Control/stop worrying 0  Worry too much - different things 1  Trouble relaxing 1  Restless 0  Easily annoyed or irritable 0  Afraid - awful might happen 0  Total GAD 7 Score 3       Assessment & Plan:   Problem List Items Addressed This Visit    RLS (restless legs syndrome) - Primary    Describes typical symptoms of this - will check iron panel and trial requip at night. Start 0.69m nightly and increase to 0.520mif not effective. Update with effect after 1-2 wks.      Relevant Orders   IBC panel   Ferritin   Obstructive sleep apnea    Now on BiPAP.  Appreciate pulm care.       MDD (major depressive disorder), recurrent episode, moderate (HCC)    Stable period on prozac 6046maily. Has tapered off klonopin.       Relevant Medications   FLUoxetine (PROZAC) 20 MG capsule   FLUoxetine (PROZAC) 40 MG capsule   Insomnia    Describes RLS type symptoms - will treat and re evaluate symptoms.       Immunization deficiency    Born 1957. Does not have immunization records accessible. Discussed he is eligible for MMR vaccination - he will check with insurance on cost.       Hyperlipidemia    lipitor started by cardiology.          Meds ordered this encounter  Medications  . FLUoxetine (PROZAC) 20 MG capsule    Sig: Take 1 capsule (20 mg total) by mouth daily.    Dispense:  90 capsule    Refill:  1  . FLUoxetine (PROZAC) 40 MG capsule    Sig: Take 1 capsule (40 mg total) by mouth daily.    Dispense:  90 capsule    Refill:  0  . DISCONTD: rOPINIRole (REQUIP) 0.25 MG tablet    Sig: Take 1 tablet (0.25 mg total) by mouth at  bedtime. After 4 days may increase to 2 tablets at bedtime    Dispense:  60 tablet    Refill:  1  . rOPINIRole (REQUIP) 0.25 MG tablet    Sig: Take 1 tablet (0.25 mg total) by mouth at bedtime. After 4 days may increase to 2 tablets at bedtime    Dispense:  60 tablet    Refill:  1   Orders Placed This Encounter  Procedures  . IBC panel  . Ferritin    Follow up plan: Return in about 6 months (around 08/14/2018) for annual exam, prior fasting for blood work.  Ria Bush, MD

## 2018-02-11 NOTE — Assessment & Plan Note (Signed)
Now on BiPAP. Appreciate pulm care.

## 2018-02-11 NOTE — Progress Notes (Signed)
Patient's Name: Mark [name]  MRN: [id number]  Referring Provider: Ria Bush, MD  DOB: [birth year]  PCP: Ria Bush, MD  DOS: 02/11/2018  Note by: Vevelyn Francois NP  Service setting: Ambulatory outpatient  Specialty: Interventional Pain Management  Location: [facility] (AMB) Pain Management Facility    Patient type: Established    Primary Reason(s) for Visit: Encounter for prescription drug management. (Level of risk: moderate)  CC: Pain  HPI  Mr. Russett is a 62 y.o. year old, male patient, who comes today for a medication management evaluation. He has Chronic pain syndrome; MDD (major depressive disorder), recurrent episode, moderate ([place]); Insomnia; GAD (generalized anxiety disorder); Medicare annual wellness visit, subsequent; Hyperlipidemia; Severe obesity (BMI 35.0-39.9) with comorbidity ([place]); Health maintenance examination; Candidal dermatitis; Benign prostatic hyperplasia with urinary obstruction; Nephrolithiasis; Bradycardia; Cardiac murmur; Opiate use (60 MME/Day); Encounter for therapeutic drug level monitoring; Chronic neck pain (Location of Primary Source of Pain) (Bilateral) (L>R); Chronic shoulder pain (Bilateral) (L>R); Chronic knee pain (Bilateral) (R>L); History of permanent cardiac pacemaker placement; Opioid-induced constipation (OIC); Radicular pain of shoulder (Bilateral) (L>R); 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); Cervicogenic headache; Cervical spondylosis with radiculopathy (Bilateral) (L>R); Cervical facet syndrome (Bilateral) (L>R); Cervical foraminal stenosis (Severe C6-7) (Left); Chronic sacroiliac joint pain (Bilateral); Osteoarthritis of sacroiliac joint (Bilateral); Lumbar facet hypertrophy (L1-2, L2-3, and L4-5) (Bilateral); Lumbar IVDD (intervertebral disc displacement); 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); Lumbar facet syndrome (Bilateral); Osteoarthritis of shoulder (Left); Osteoarthritis of knee (Bilateral) (R>L); Obstructive sleep  apnea; Hypocitraturia; Long term current use of opiate analgesic; Chronic cervical radicular pain; Advanced care planning/counseling discussion; Chronic fatigue; Low testosterone in male; RLS (restless legs syndrome); and Immunization deficiency on their problem list. His primarily concern today is the Pain  Pain Assessment: Location: Left Neck Radiating: left arm and also his right knee Onset: More than a month ago Duration: Chronic pain Quality: Aching, Sharp Severity: 5 /10 (subjective, self-reported pain score)  Note: Reported level is compatible with observation.                            Timing: Constant Modifying factors: medications and rest. not as much help lately BP: (!) 158/86   HR: 71  [name] was last scheduled for an appointment on 11/12/2017 for medication management. During today's appointment we reviewed [name]'s chronic pain status, as well as his outpatient medication regimen. He feels like the pain is waking him up at night. He admits that he uses an additional pain tablet at night. He does not feel like the injections are effective. He admits that he had very little relief. He admits that it just a "waste of money". He admits that he was on muscle relaxer's were too much. He states that he failed Lyrica it caused pain. He has failed melatonin and APAP/PM.   The patient  reports that he does not use drugs. His body mass index is 34.21 kg/m.  Further details on both, my assessment(s), as well as the proposed treatment plan, please see below.  Controlled Substance Pharmacotherapy Assessment REMS (Risk Evaluation and Mitigation Strategy)  Analgesic:Oxycodone ER 10 mg every 12 hours (20 mg/day) + oxycodone IR 5 mg every 6 hours (20 mg/day) MME/day:60 mg/day   Hart Rochester, RN  02/11/2018  1:30 PM  Sign at close encounter Nursing Pain Medication Assessment:  Safety precautions to be maintained throughout the outpatient stay will include: orient to  surroundings, keep bed in  low position, maintain call bell within reach at all times, provide assistance with transfer out of bed and ambulation.  Medication Inspection Compliance: Pill count conducted under aseptic conditions, in front of the patient. Neither the pills nor the bottle was removed from the patient's sight at any time. Once count was completed pills were immediately returned to the patient in their original bottle.  Medication #1: Oxycodone ER (OxyContin) Pill/Patch Count: 20 of 60 pills remain Pill/Patch Appearance: Markings consistent with prescribed medication Bottle Appearance: Standard pharmacy container. Clearly labeled. Filled Date: 06 / 17 / 2019 Last Medication intake:  Today  Medication #2: Oxycodone IR Pill/Patch Count: 20 of 120 pills remain Pill/Patch Appearance: Markings consistent with prescribed medication Bottle Appearance: Standard pharmacy container. Clearly labeled. Filled Date: 06 / 17 / 2019   Pharmacokinetics: Liberation and absorption (onset of action): WNL Distribution (time to peak effect): WNL Metabolism and excretion (duration of action): WNL         Pharmacodynamics: Desired effects: Analgesia: Mr. Brawley reports >50% benefit. Functional ability: Patient reports that medication allows him to accomplish basic ADLs Clinically meaningful improvement in function (CMIF): Sustained CMIF goals met Perceived effectiveness: Described as relatively effective, allowing for increase in activities of daily living (ADL) Undesirable effects: Side-effects or Adverse reactions: None reported Monitoring: [place] PMP: Online review of the past 58-monthperiod conducted. Compliant with practice rules and regulations Last UDS on record: Summary  Date Value Ref Range Status  11/12/2017 FINAL  Final    Comment:    ==================================================================== TOXASSURE SELECT 13  (MW) ==================================================================== Test                             Result       Flag       Units Drug Present and Declared for Prescription Verification   Oxycodone                      1390         EXPECTED   ng/mg creat   Noroxycodone                   3415         EXPECTED   ng/mg creat    Sources of oxycodone include scheduled prescription medications.    Noroxycodone is an expected metabolite of oxycodone. ==================================================================== Test                      Result    Flag   Units      Ref Range   Creatinine              41               mg/dL      >=20 ==================================================================== Declared Medications:  The flagging and interpretation on this report are based on the  following declared medications.  Unexpected results may arise from  inaccuracies in the declared medications.  **Note: The testing scope of this panel includes these medications:  Oxycodone  **Note: The testing scope of this panel does not include following  reported medications:  Aspirin (Aspirin 81)  Atorvastatin  Cetirizine (Zyrtec)  Docusate (Colace)  Fluoxetine (Prozac)  Furosemide (Lasix)  Herbal Product  Multivitamin  Naproxen  Omega-3 Fatty Acids (Fish Oil)  Omeprazole  Potassium  Sildenafil (Revatio)  Vitamin B  Vitamin D  Vitamin E ==================================================================== For clinical consultation, please call [phone]. ====================================================================    UDS interpretation: Compliant          Medication Assessment Form: Reviewed. Patient indicates being compliant with therapy Treatment compliance: Compliant Risk Assessment Profile: Aberrant behavior: See prior evaluations. None observed or detected today Comorbid factors increasing risk of overdose: See prior notes. No additional risks detected  today Risk of substance use disorder (SUD): Low  ORT Scoring interpretation table:  Score <3 = Low Risk for SUD  Score between 4-7 = Moderate Risk for SUD  Score >8 = High Risk for Opioid Abuse   Risk Mitigation Strategies:  Patient Counseling: Covered Patient-Prescriber Agreement (PPA): Present and active  Notification to other healthcare providers: Done  Pharmacologic Plan: No change in therapy, at this time.             Laboratory Chemistry  Inflammation Markers (CRP: Acute Phase) (ESR: Chronic Phase) Lab Results  Component Value Date   CRP 1.7 (H) 09/01/2015   ESRSEDRATE 6 09/01/2015                         Rheumatology Markers No results found for: RF, ANA, LABURIC, URICUR, LYMEIGGIGMAB, LYMEABIGMQN, HLAB27                      Renal Function Markers Lab Results  Component Value Date   BUN 25 (H) 12/06/2017   CREATININE 1.20 12/06/2017   GFRAA >60 12/06/2017   GFRNONAA >60 12/06/2017                             Hepatic Function Markers Lab Results  Component Value Date   AST 23 08/08/2017   ALT 25 08/08/2017   ALBUMIN 4.0 08/08/2017   ALKPHOS 54 08/08/2017   HCVAB NEGATIVE 07/22/2013                        Electrolytes Lab Results  Component Value Date   NA 138 12/06/2017   K 3.8 12/06/2017   CL 105 12/06/2017   CALCIUM 9.0 12/06/2017   MG 2.1 09/01/2015                        Neuropathy Markers Lab Results  Component Value Date   VITAMINB12 861 02/02/2016   HGBA1C 5.5 08/08/2017                        Bone Pathology Markers Lab Results  Component Value Date   25OHVITD1 63 02/02/2016   25OHVITD2 <1.0 02/02/2016   25OHVITD3 63 02/02/2016   TESTOSTERONE 216.29 (L) 02/13/2017                         Coagulation Parameters Lab Results  Component Value Date   INR 1.14 12/06/2017   LABPROT 14.5 12/06/2017   APTT 32 12/06/2017   PLT 171 12/06/2017                        Cardiovascular Markers Lab Results  Component Value Date   HGB  14.7 12/06/2017   HCT 41.3 12/06/2017                         CA Markers No results found for: CEA, CA125, LABCA2  Note: Lab results reviewed.  Recent Diagnostic Imaging Results  DG Chest 2 View CLINICAL DATA:  Preop  EXAM: CHEST - 2 VIEW  COMPARISON:  02/17/2008  FINDINGS: Double lead left subclavian pacemaker device and leads are stable and intact. Normal heart size. Clear lungs.  IMPRESSION: No active cardiopulmonary disease.  Electronically Signed   By: Marybelle Killings M.D.   On: 12/06/2017 16:13  Complexity Note: Imaging results reviewed. Results shared with Mr. Comer, using Layman's terms.                         Meds   Current Outpatient Medications:    aspirin 81 MG tablet, Take 81 mg by mouth daily.  , Disp: , Rfl:    atorvastatin (LIPITOR) 20 MG tablet, Take 1 tablet (20 mg total) by mouth daily. (Patient taking differently: Take 20 mg by mouth at bedtime. ), Disp: 90 tablet, Rfl: 0   cetirizine (ZYRTEC) 10 MG tablet, Take 10 mg by mouth at bedtime. , Disp: , Rfl:    Cholecalciferol (VITAMIN D) 2000 units CAPS, Take 2,000 Units by mouth daily. , Disp: , Rfl:    CRANBERRY PO, Take 4,200 mg by mouth at bedtime. , Disp: , Rfl:    desonide (DESOWEN) 0.05 % ointment, Apply topically 2 (two) times daily. (Patient taking differently: Apply 1 application topically 2 (two) times daily as needed (for rash on chin due to dryness). ), Disp: 60 g, Rfl: 1   docusate sodium (COLACE) 100 MG capsule, Take 100 mg by mouth 2 (two) times daily., Disp: , Rfl:    fluticasone (FLONASE) 50 MCG/ACT nasal spray, Place 2 sprays into both nostrils daily., Disp: 48 g, Rfl: 1   furosemide (LASIX) 20 MG tablet, TAKE 1 BY MOUTH DAILY, Disp: 90 tablet, Rfl: 3   Garlic 7793 MG CAPS, Take 1,000 mg by mouth daily. , Disp: , Rfl:    Multiple Vitamin (MULTIVITAMIN PO), Take 1 tablet by mouth daily.  , Disp: , Rfl:    Naloxone HCl (NARCAN) 4 MG/0.1ML LIQD, Spray into one  nostril. Repeat with second device into other nostril after 2-3 minutes if no or minimal response., Disp: 2 each, Rfl: 0   naproxen (NAPROSYN) 500 MG tablet, TAKE 1 BY MOUTH TWICE DAILY WITH A MEAL, Disp: 180 tablet, Rfl: 1   Omega-3 1000 MG CAPS, Take 1,000 mg by mouth at bedtime., Disp: , Rfl:    omeprazole (PRILOSEC) 20 MG capsule, TAKE 1 BY MOUTH DAILY, Disp: 90 capsule, Rfl: 2   potassium citrate (UROCIT-K) 10 MEQ (1080 MG) SR tablet, Take 1 tablet (10 mEq total) by mouth 2 (two) times daily., Disp: 180 tablet, Rfl: 3   Saw Palmetto, Serenoa repens, (SAW PALMETTO PO), Take 450 mg by mouth 2 (two) times daily. , Disp: , Rfl:    sildenafil (REVATIO) 20 MG tablet, Take 60-100 mg by mouth daily as needed (for erectile dysfunction.)., Disp: , Rfl:    VITAMIN B COMPLEX-C CAPS, Take by mouth daily. , Disp: , Rfl:    vitamin E 1000 UNIT capsule, Take 1,000 Units by mouth daily. , Disp: , Rfl:    FLUoxetine (PROZAC) 20 MG capsule, Take 1 capsule (20 mg total) by mouth daily., Disp: 90 capsule, Rfl: 1   FLUoxetine (PROZAC) 40 MG capsule, Take 1 capsule (40 mg total) by mouth daily., Disp: 90 capsule, Rfl: 0   rOPINIRole (REQUIP) 0.25 MG tablet, Take 1 tablet (0.25 mg total) by mouth  at bedtime. After 4 days may increase to 2 tablets at bedtime, Disp: 60 tablet, Rfl: 1  ROS  Constitutional: Denies any fever or chills Gastrointestinal: No reported hemesis, hematochezia, vomiting, or acute GI distress Musculoskeletal: Denies any acute onset joint swelling, redness, loss of ROM, or weakness Neurological: No reported episodes of acute onset apraxia, aphasia, dysarthria, agnosia, amnesia, paralysis, loss of coordination, or loss of consciousness  Allergies  Mr. Mounts is allergic to ciprofloxacin; metformin; buprenorphine hcl; morphine and related; and penicillins.  [place]  Drug: Mr. Mortimer  reports that he does not use drugs. Alcohol:  reports that he does not drink alcohol. Tobacco:  reports  that he has never smoked. He has never used smokeless tobacco. Medical:  has a past medical history of Anxiety, Arthritis, Chronic pain, Depression, Heart murmur, History of kidney stones (April 12,2017), History of permanent cardiac pacemaker placement (05/31/2015), Hydrocele in adult (05/07/2015), Kidney stones, Migraine, MRSA carrier (2019), Presence of permanent cardiac pacemaker, Prostatitis, Sleep apnea, and Sleep apnea. Surgical: Mr. Koury  has a past surgical history that includes Tonsillectomy (1964); Carpal tunnel release (Bilateral, 2006); Ulnar nerve repair (2008); Cervical spine surgery (2008); Rotator cuff repair (Left, 2009); pacemaker placement (2009); Lithotripsy (Bilateral, 2012); Lithotripsy (Left, 11/2015); Cardiovascular stress test (2014); Colonoscopy (07/2007); implantable cardioverter defibrillator (icd) generator change (Left, 12/13/2017); and pace maker revision (12/13/2017). Family: family history includes Arthritis in his father and mother; Cancer in his father and mother; Diabetes in his mother; Heart block in his father; Heart disease in his father and mother.  Constitutional Exam  General appearance: Well nourished, well developed, and well hydrated. In no apparent acute distress Vitals:   02/11/18 1304  BP: (!) 158/86  Pulse: 71  Resp: 16  Temp: 98.2 F (36.8 C)  TempSrc: Oral  SpO2: 100%  Weight: 225 lb (102.1 kg)  Height: 5' 8"  (1.727 m)  Psych/Mental status: Alert, oriented x 3 (person, place, & time)       Eyes: PERLA Respiratory: No evidence of acute respiratory distress  Lumbar Spine Area Exam  Skin & Axial Inspection: No masses, redness, or swelling Alignment: Symmetrical Functional ROM: Unrestricted ROM       Stability: No instability detected Muscle Tone/Strength: Functionally intact. No obvious neuro-muscular anomalies detected. Sensory (Neurological): Unimpaired Palpation: No palpable anomalies       Provocative Tests: Lumbar  Hyperextension/rotation test: deferred today       Lumbar quadrant test (Kemp's test): deferred today       Lumbar Lateral bending test: deferred today       Patrick's Maneuver: deferred today                   FABER test: deferred today       Thigh-thrust test: deferred today       S-I compression test: deferred today       S-I distraction test: deferred today        Gait & Posture Assessment  Ambulation: Unassisted Gait: Relatively normal for age and body habitus Posture: WNL   Lower Extremity Exam    Side: Right lower extremity  Side: Left lower extremity  Stability: No instability observed          Stability: No instability observed          Skin & Extremity Inspection: Skin color, temperature, and hair growth are WNL. No peripheral edema or cyanosis. No masses, redness, swelling, asymmetry, or associated skin lesions. No contractures.  Skin & Extremity Inspection: Skin color,  temperature, and hair growth are WNL. No peripheral edema or cyanosis. No masses, redness, swelling, asymmetry, or associated skin lesions. No contractures.  Functional ROM: Unrestricted ROM                  Functional ROM: Unrestricted ROM                  Muscle Tone/Strength: Functionally intact. No obvious neuro-muscular anomalies detected.  Muscle Tone/Strength: Functionally intact. No obvious neuro-muscular anomalies detected.  Sensory (Neurological): Unimpaired  Sensory (Neurological): Unimpaired  Palpation: No palpable anomalies  Palpation: No palpable anomalies   Assessment  Primary Diagnosis & Pertinent Problem List: The primary encounter diagnosis was Cervical facet syndrome (Bilateral) (L>R). Diagnoses of Cervical spondylosis with radiculopathy (Bilateral) (L>R), Chronic pain of both shoulders, Chronic pain syndrome, Long term prescription opiate use, and Cervicogenic headache were also pertinent to this visit.  Status Diagnosis  Persistent Persistent Persistent 1. Cervical facet syndrome  (Bilateral) (L>R)   2. Cervical spondylosis with radiculopathy (Bilateral) (L>R)   3. Chronic pain of both shoulders   4. Chronic pain syndrome   5. Long term prescription opiate use   6. Cervicogenic headache     Problems updated and reviewed during this visit: No problems updated. Plan of Care  Pharmacotherapy (Medications Ordered): Meds ordered this encounter  Medications   oxyCODONE (OXY IR/ROXICODONE) 5 MG immediate release tablet    Sig: Take 1 tablet (5 mg total) by mouth every 6 (six) hours as needed for severe pain.    Dispense:  120 tablet    Refill:  0    Do not add this medication to the electronic "Automatic Refill" notification system. Patient may have prescription filled one day early if pharmacy is closed on scheduled refill date. Do not fill until: 04/19/2018 To last until:05/19/2018    Order Specific Question:   Supervising Provider    Answer:   Milinda Pointer [phone]   oxyCODONE (OXY IR/ROXICODONE) 5 MG immediate release tablet    Sig: Take 1 tablet (5 mg total) by mouth every 6 (six) hours as needed for severe pain.    Dispense:  120 tablet    Refill:  0    Do not add this medication to the electronic "Automatic Refill" notification system. Patient may have prescription filled one day early if pharmacy is closed on scheduled refill date. Do not fill until:03/20/2018 To last until: 04/19/2018    Order Specific Question:   Supervising Provider    Answer:   Milinda Pointer [phone]   oxyCODONE (OXY IR/ROXICODONE) 5 MG immediate release tablet    Sig: Take 1 tablet (5 mg total) by mouth every 6 (six) hours as needed for severe pain.    Dispense:  120 tablet    Refill:  0    Do not add this medication to the electronic "Automatic Refill" notification system. Patient may have prescription filled one day early if pharmacy is closed on scheduled refill date. Do not fill until: 02/18/2018 To last until: 03/20/2018    Order Specific Question:   Supervising  Provider    Answer:   Milinda Pointer [phone]   oxyCODONE (OXYCONTIN) 10 mg 12 hr tablet    Sig: Take 1 tablet (10 mg total) by mouth every 12 (twelve) hours.    Dispense:  60 tablet    Refill:  0    Do not add this medication to the electronic "Automatic Refill" notification system. Patient may have prescription filled one day early if pharmacy  is closed on scheduled refill date. Do not fill until: 04/19/2018 To last until:05/19/2018    Order Specific Question:   Supervising Provider    Answer:   Milinda Pointer [phone]   oxyCODONE (OXYCONTIN) 10 mg 12 hr tablet    Sig: Take 1 tablet (10 mg total) by mouth every 12 (twelve) hours.    Dispense:  60 tablet    Refill:  0    Do not add this medication to the electronic "Automatic Refill" notification system. Patient may have prescription filled one day early if pharmacy is closed on scheduled refill date. Do not fill until: 03/20/2018 To last until: 04/19/2018    Order Specific Question:   Supervising Provider    Answer:   Milinda Pointer [phone]   oxyCODONE (OXYCONTIN) 10 mg 12 hr tablet    Sig: Take 1 tablet (10 mg total) by mouth every 12 (twelve) hours.    Dispense:  60 tablet    Refill:  0    02/18/2018    Order Specific Question:   Supervising Provider    Answer:   Milinda Pointer [phone]   New Prescriptions   No medications on file   Medications administered today: Jurrell Royster. Lederman had no medications administered during this visit. Lab-work, procedure(s), and/or referral(s): Orders Placed This Encounter  Procedures   ToxASSURE Select 13 (MW), Urine   Imaging and/or referral(s): None  Interventional therapies: Planned, scheduled, and/or pending:  Not at this time. Gabapentin trial   Considering:  Complete a series of 5 Hyalgan intra-articular knee injections, bilaterally.  Diagnostic bilateral cervical facet block  Possible bilateral cervical facet radiofrequencyablation  Diagnostic left sided  cervical epiduralsteroid injection  Diagnostic bilateral intra-articular knee injection Diagnostic bilateral genicular nerve blocks Possible bilateral genicular nerve radiofrequencyablation  Diagnostic bilateral sacroiliac joint block Possible bilateral sacroiliac joint radiofrequencyablation  Diagnostic bilateral intra-articular shoulder jointinjection  Diagnostic bilateral suprascapular nerve block Possible bilateral suprascapular nerve radiofrequencyablation  Diagnostic bilateral lumbar facet block Possible bilateral lumbar facet radiofrequencyablation  Diagnostic left L4-5 lumbar epidural steroid injection    Palliative PRN treatment(s):  Bilateral intra-articular Hyalgan knee injectionseries (injection #4)  Diagnostic bilateral cervical facetblock  Diagnostic left sided cervical epiduralsteroid injection  Diagnostic bilateral intra-articular knee injection Diagnostic bilateral genicular nerve blocks Diagnostic bilateral sacroiliac joint block Diagnostic bilateral intra-articular shoulder joint injection  Diagnostic bilateral suprascapularnerve block  Diagnostic bilateral lumbar facetblock  Diagnostic left L4-5 lumbar epidural steroid injection     Provider-requested follow-up: Return in about 3 months (around 05/14/2018) for MedMgmt with Me Donella Stade Edison Pace).  Future Appointments  Date Time Provider [place]  05/14/2018 12:45 PM Vevelyn Francois, NP [facility] None  08/09/2018  8:05 AM [facility]  08/16/2018  2:30 PM Eustace Pen, LPN [facility]  7/74/1423  3:00 PM Ria Bush, MD [facility]  08/29/2018  2:15 PM Stoioff, Ronda Fairly, MD [facility] None   Primary Care Physician: Ria Bush, MD Location: [facility] Note by: Vevelyn Francois NP Date: 02/11/2018; Time: 4:02 PM  Pain Score Disclaimer: We use the NRS-11 scale. This is a self-reported, subjective measurement of pain severity with  only modest accuracy. It is used primarily to identify changes within a particular patient. It must be understood that outpatient pain scales are significantly less accurate that those used for research, where they can be applied under ideal controlled circumstances with minimal exposure to variables. In reality, the score is likely to be a combination of pain intensity and pain  affect, where pain affect describes the degree of emotional arousal or changes in action readiness caused by the sensory experience of pain. Factors such as social and work situation, setting, emotional state, anxiety levels, expectation, and prior pain experience may influence pain perception and show large inter-individual differences that may also be affected by time variables.  Patient instructions provided during this appointment: Patient Instructions   ____________________________________________________________________________________________  Appointment Policy Summary  It is our goal and responsibility to provide the medical community with assistance in the evaluation and management of patients with chronic pain. Unfortunately our resources are limited. Because we do not have an unlimited amount of time, or available appointments, we are required to closely monitor and manage their use. The following rules exist to maximize their use:  Patient's responsibilities: 1. Punctuality:  At what time should I arrive? You should be physically present in our office 30 minutes before your scheduled appointment. Your scheduled appointment is with your assigned healthcare provider. However, it takes 5-10 minutes to be "checked-in", and another 15 minutes for the nurses to do the admission. If you arrive to our office at the time you were given for your appointment, you will end up being at least 20-25 minutes late to your appointment with the provider. 2. Tardiness:  What happens if I arrive only a few minutes after my scheduled  appointment time? You will need to reschedule your appointment. The cutoff is your appointment time. This is why it is so important that you arrive at least 30 minutes before that appointment. If you have an appointment scheduled for 10:00 AM and you arrive at 10:01, you will be required to reschedule your appointment.  3. Plan ahead:  Always assume that you will encounter traffic on your way in. Plan for it. If you are dependent on a driver, make sure they understand these rules and the need to arrive early. 4. Other appointments and responsibilities:  Avoid scheduling any other appointments before or after your pain clinic appointments.  5. Be prepared:  Write down everything that you need to discuss with your healthcare provider and give this information to the admitting nurse. Write down the medications that you will need refilled. Bring your pills and bottles (even the empty ones), to all of your appointments, except for those where a procedure is scheduled. 6. No children or pets:  Find someone to take care of them. It is not appropriate to bring them in. 7. Scheduling changes:  We request "advanced notification" of any changes or cancellations. 8. Advanced notification:  Defined as a time period of more than 24 hours prior to the originally scheduled appointment. This allows for the appointment to be offered to other patients. 9. Rescheduling:  When a visit is rescheduled, it will require the cancellation of the original appointment. For this reason they both fall within the category of "Cancellations".  10. Cancellations:  They require advanced notification. Any cancellation less than 24 hours before the  appointment will be recorded as a "No Show". 11. No Show:  Defined as an unkept appointment where the patient failed to notify or declare to the practice their intention or inability to keep the appointment.  Corrective process for repeat offenders:  1. Tardiness: Three (3) episodes of  rescheduling due to late arrivals will be recorded as one (1) "No Show". 2. Cancellation or reschedule: Three (3) cancellations or rescheduling will be recorded as one (1) "No Show". 3. "No Shows": Three (3) "No Shows" within a 12 month  period will result in discharge from the practice. ____________________________________________________________________________________________  ____________________________________________________________________________________________  Pain Scale  Introduction: The pain score used by this practice is the Verbal Numerical Rating Scale (VNRS-11). This is an 11-point scale. It is for adults and children 10 years or older. There are significant differences in how the pain score is reported, used, and applied. Forget everything you learned in the past and learn this scoring system.  General Information: The scale should reflect your current level of pain. Unless you are specifically asked for the level of your worst pain, or your average pain. If you are asked for one of these two, then it should be understood that it is over the past 24 hours.  Basic Activities of Daily Living (ADL): Personal hygiene, dressing, eating, transferring, and using restroom.  Instructions: Most patients tend to report their level of pain as a combination of two factors, their physical pain and their psychosocial pain. This last one is also known as suffering and it is reflection of how physical pain affects you socially and psychologically. From now on, report them separately. From this point on, when asked to report your pain level, report only your physical pain. Use the following table for reference.  Pain Clinic Pain Levels (0-5/10)  Pain Level Score  Description  No Pain 0   Mild pain 1 Nagging, annoying, but does not interfere with basic activities of daily living (ADL). Patients are able to eat, bathe, get dressed, toileting (being able to get on and off the toilet and perform  personal hygiene functions), transfer (move in and out of bed or a chair without assistance), and maintain continence (able to control bladder and bowel functions). Blood pressure and heart rate are unaffected. A normal heart rate for a healthy adult ranges from 60 to 100 bpm (beats per minute).   Mild to moderate pain 2 Noticeable and distracting. Impossible to hide from other people. More frequent flare-ups. Still possible to adapt and function close to normal. It can be very annoying and may have occasional stronger flare-ups. With discipline, patients may get used to it and adapt.   Moderate pain 3 Interferes significantly with activities of daily living (ADL). It becomes difficult to feed, bathe, get dressed, get on and off the toilet or to perform personal hygiene functions. Difficult to get in and out of bed or a chair without assistance. Very distracting. With effort, it can be ignored when deeply involved in activities.   Moderately severe pain 4 Impossible to ignore for more than a few minutes. With effort, patients may still be able to manage work or participate in some social activities. Very difficult to concentrate. Signs of autonomic nervous system discharge are evident: dilated pupils (mydriasis); mild sweating (diaphoresis); sleep interference. Heart rate becomes elevated (>115 bpm). Diastolic blood pressure (lower number) rises above 100 mmHg. Patients find relief in laying down and not moving.   Severe pain 5 Intense and extremely unpleasant. Associated with frowning face and frequent crying. Pain overwhelms the senses.  Ability to do any activity or maintain social relationships becomes significantly limited. Conversation becomes difficult. Pacing back and forth is common, as getting into a comfortable position is nearly impossible. Pain wakes you up from deep sleep. Physical signs will be obvious: pupillary dilation; increased sweating; goosebumps; brisk reflexes; cold, clammy hands and  feet; nausea, vomiting or dry heaves; loss of appetite; significant sleep disturbance with inability to fall asleep or to remain asleep. When persistent, significant weight loss is observed due  to the complete loss of appetite and sleep deprivation.  Blood pressure and heart rate becomes significantly elevated. Caution: If elevated blood pressure triggers a pounding headache associated with blurred vision, then the patient should immediately seek attention at an urgent or emergency care unit, as these may be signs of an impending stroke.    Emergency Department Pain Levels (6-10/10)  Emergency Room Pain 6 Severely limiting. Requires emergency care and should not be seen or managed at an outpatient pain management facility. Communication becomes difficult and requires great effort. Assistance to reach the emergency department may be required. Facial flushing and profuse sweating along with potentially dangerous increases in heart rate and blood pressure will be evident.   Distressing pain 7 Self-care is very difficult. Assistance is required to transport, or use restroom. Assistance to reach the emergency department will be required. Tasks requiring coordination, such as bathing and getting dressed become very difficult.   Disabling pain 8 Self-care is no longer possible. At this level, pain is disabling. The individual is unable to do even the most basic activities such as walking, eating, bathing, dressing, transferring to a bed, or toileting. Fine motor skills are lost. It is difficult to think clearly.   Incapacitating pain 9 Pain becomes incapacitating. Thought processing is no longer possible. Difficult to remember your own name. Control of movement and coordination are lost.   The worst pain imaginable 10 At this level, most patients pass out from pain. When this level is reached, collapse of the autonomic nervous system occurs, leading to a sudden drop in blood pressure and heart rate. This in  turn results in a temporary and dramatic drop in blood flow to the brain, leading to a loss of consciousness. Fainting is one of the bodys self defense mechanisms. Passing out puts the brain in a calmed state and causes it to shut down for a while, in order to begin the healing process.    Summary: 1. Refer to this scale when providing [place] with your pain level. 2. Be accurate and careful when reporting your pain level. This will help with your care. 3. Over-reporting your pain level will lead to loss of credibility. 4. Even a level of 1/10 means that there is pain and will be treated at our facility. 5. High, inaccurate reporting will be documented as Symptom Exaggeration, leading to loss of credibility and suspicions of possible secondary gains such as obtaining more narcotics, or wanting to appear disabled, for fraudulent reasons. 6. Only pain levels of 5 or below will be seen at our facility. 7. Pain levels of 6 and above will be sent to the Emergency Department and the appointment cancelled. ____________________________________________________________________________________________   BMI Assessment: Estimated body mass index is 34.21 kg/m as calculated from the following:   Height as of this encounter: 5' 8"  (1.727 m).   Weight as of this encounter: 225 lb (102.1 kg).  BMI interpretation table: BMI level Category Range association with higher incidence of chronic pain  <18 kg/m2 Underweight   18.5-24.9 kg/m2 Ideal body weight   25-29.9 kg/m2 Overweight Increased incidence by 20%  30-34.9 kg/m2 Obese (Class I) Increased incidence by 68%  35-39.9 kg/m2 Severe obesity (Class II) Increased incidence by 136%  >40 kg/m2 Extreme obesity (Class III) Increased incidence by 254%   Patient's current BMI Ideal Body weight  Body mass index is 34.21 kg/m. Ideal body weight: 68.4 kg (150 lb 12.7 oz) Adjusted ideal body weight: 81.9 kg (180 lb 7.6 oz)  BMI Readings from Last 4 Encounters:   02/11/18 34.21 kg/m  01/07/18 35.52 kg/m  12/13/17 36.02 kg/m  12/06/17 36.02 kg/m   Wt Readings from Last 4 Encounters:  02/11/18 225 lb (102.1 kg)  01/07/18 226 lb 12.8 oz (102.9 kg)  12/13/17 230 lb (104.3 kg)  12/06/17 230 lb (104.3 kg)   ____________________________________________________________________________________________  Drug Holidays (Rapid)  Definitions Tolerance: defined as the progressively decreased responsiveness to a drug. Occurs when the drug is used repeatedly and the body adapts to the continued presence of the drug. As a result, a larger dose of the drug is needed to achieve the effect originally obtained by a smaller dose. It is thought to be due to the formation of excess opioid receptors.  Drug Holiday: is when a patient stops taking a medication(s) for a period of time; anywhere from a few days to several weeks.  Withdrawals: refers to the wide range of symptoms that occur after stopping or dramatically reducing opiate drugs after heavy and prolonged use. Withdrawal symptoms do not occur to patients that use low dose opioids, or those who take the medication sporadically. Contrary to benzodiazepine (example: Valium, Xanax, etc.) or alcohol withdrawals (Delirium Tremens), opioid withdrawals are not lethal. Withdrawals are the physical manifestation of the body getting rid of the excess receptors.  Purpose To eliminate tolerance.  Duration of Holiday 14 consecutive days. (2 weeks)  Expected Symptoms Early symptoms of withdrawal include:  Agitation  Anxiety  Muscle aches  Increased tearing  Insomnia  Runny nose  Sweating  Yawning  Late symptoms of withdrawal include:  Abdominal cramping  Diarrhea  Dilated pupils  Goose bumps  Nausea  Vomiting  Opioid withdrawal reactions are very uncomfortable but are not life-threatening. Symptoms usually start within 12 hours of last opioid dose and within 30 hours of last methadone  exposure.  Duration of Symptoms 48 to 72 hours for short acting medications and 2 to 14 days for methadone.  Treatment  Clonidine (Catapres) or tizanidine (Zanaflex) for agitation, sweating, tearing, runny nose.  Promethazine (Phenergan) for nausea, vomiting.  NSAIDs for pain.  Benefits  Improved effectiveness of opioids.  Decreased opioid dose needed to achieve benefits.  Improved pain with lesser dose. ____________________________________________________________________________________________

## 2018-02-11 NOTE — Progress Notes (Signed)
Nursing Pain Medication Assessment:  Safety precautions to be maintained throughout the outpatient stay will include: orient to surroundings, keep bed in low position, maintain call bell within reach at all times, provide assistance with transfer out of bed and ambulation.  Medication Inspection Compliance: Pill count conducted under aseptic conditions, in front of the patient. Neither the pills nor the bottle was removed from the patient's sight at any time. Once count was completed pills were immediately returned to the patient in their original bottle.  Medication #1: Oxycodone ER (OxyContin) Pill/Patch Count: 20 of 60 pills remain Pill/Patch Appearance: Markings consistent with prescribed medication Bottle Appearance: Standard pharmacy container. Clearly labeled. Filled Date: 06 / 17 / 2019 Last Medication intake:  Today  Medication #2: Oxycodone IR Pill/Patch Count: 20 of 120 pills remain Pill/Patch Appearance: Markings consistent with prescribed medication Bottle Appearance: Standard pharmacy container. Clearly labeled. Filled Date: 06 / 17 / 2019

## 2018-02-11 NOTE — Assessment & Plan Note (Signed)
Born 1957. Does not have immunization records accessible. Discussed he is eligible for MMR vaccination - he will check with insurance on cost.

## 2018-02-11 NOTE — Assessment & Plan Note (Signed)
lipitor started by cardiology.

## 2018-02-12 LAB — FERRITIN: Ferritin: 23.5 ng/mL (ref 22.0–322.0)

## 2018-02-12 LAB — IBC PANEL
Iron: 51 ug/dL (ref 42–165)
Saturation Ratios: 13.3 % — ABNORMAL LOW (ref 20.0–50.0)
Transferrin: 273 mg/dL (ref 212.0–360.0)

## 2018-02-18 LAB — TOXASSURE SELECT 13 (MW), URINE

## 2018-03-11 ENCOUNTER — Encounter: Payer: Self-pay | Admitting: Family Medicine

## 2018-03-13 DIAGNOSIS — Z0000 Encounter for general adult medical examination without abnormal findings: Secondary | ICD-10-CM | POA: Diagnosis not present

## 2018-03-16 DIAGNOSIS — G4733 Obstructive sleep apnea (adult) (pediatric): Secondary | ICD-10-CM | POA: Diagnosis not present

## 2018-03-19 ENCOUNTER — Encounter: Payer: Self-pay | Admitting: Family Medicine

## 2018-03-19 ENCOUNTER — Ambulatory Visit: Payer: Medicare Other | Admitting: Family Medicine

## 2018-03-19 VITALS — BP 140/80 | HR 84 | Temp 98.4°F | Ht 67.0 in | Wt 230.8 lb

## 2018-03-19 DIAGNOSIS — G2581 Restless legs syndrome: Secondary | ICD-10-CM | POA: Diagnosis not present

## 2018-03-19 DIAGNOSIS — H029 Unspecified disorder of eyelid: Secondary | ICD-10-CM

## 2018-03-19 DIAGNOSIS — I495 Sick sinus syndrome: Secondary | ICD-10-CM | POA: Diagnosis not present

## 2018-03-19 DIAGNOSIS — L309 Dermatitis, unspecified: Secondary | ICD-10-CM

## 2018-03-19 MED ORDER — DESONIDE 0.05 % EX OINT: 1.0000 "application " | TOPICAL_OINTMENT | Freq: Two times a day (BID) | CUTANEOUS | 0 refills | Status: DC | PRN

## 2018-03-19 MED ORDER — ROPINIROLE HCL 0.5 MG PO TABS: 0.5000 mg | ORAL_TABLET | Freq: Every day | ORAL | 1 refills | Status: DC

## 2018-03-19 NOTE — Assessment & Plan Note (Signed)
Refilled requip at higher dose as more effective.

## 2018-03-19 NOTE — Progress Notes (Signed)
   Subjective:    Patient ID: Mark [name], male    DOB: 04/12/1956, 62 y.o.   MRN: [id number]  HPI 62 year old male pt of Dr. Reece AgarG presents with new onset lesion on  Left lower eyelid and yellowish discharge from eye in last 3 days. Crusty on lower lashes.  Pulled down lower lid and sees growth on lower lid.  No pain, no redness of conjunctiva. No fever. Has optometrist W. Velna HatchetLee Turner. He cannot do anything about it.   Saw Dr. Reece AgarG on 7/8 for RLS.Marland Kitchen. Requests refill of requipst higher dose.  Review of Systems  Constitutional: Negative for fatigue and fever.  HENT: Negative for ear pain.   Eyes: Negative for pain.  Respiratory: Negative for cough and shortness of breath.   Cardiovascular: Negative for chest pain, palpitations and leg swelling.  Gastrointestinal: Negative for abdominal pain.  Genitourinary: Negative for dysuria.  Musculoskeletal: Negative for arthralgias.  Neurological: Negative for syncope, light-headedness and headaches.  Psychiatric/Behavioral: Negative for dysphoric mood.       Objective:   Physical Exam  Constitutional: Vital signs are normal. He appears well-developed and well-nourished.  HENT:  Head: Normocephalic.  Right Ear: Hearing normal.  Left Ear: Hearing normal.  Nose: Nose normal.  Mouth/Throat: Oropharynx is clear and moist and mucous membranes are normal.  Eyes: Conjunctivae are normal. Lids are everted and swept, no foreign bodies found. Right eye exhibits no discharge, no exudate and no hordeolum. No foreign body present in the right eye. Left eye exhibits no discharge, no exudate and no hordeolum. No foreign body present in the left eye.  When evvert lower lid, 7 mm elongated polyp protrudes form lower lid.  Can also see tissue of polyp protruding up to iris lower margin.  Neck: Trachea normal. Carotid bruit is not present. No thyroid mass and no thyromegaly present.  Cardiovascular: Normal rate, regular rhythm and normal pulses. Exam reveals no  gallop, no distant heart sounds and no friction rub.  No murmur heard. No peripheral edema  Pulmonary/Chest: Effort normal and breath sounds normal. No respiratory distress.  Skin: Skin is warm, dry and intact. No rash noted.  Psychiatric: He has a normal mood and affect. His speech is normal and behavior is normal. Thought content normal.          Assessment & Plan:

## 2018-03-19 NOTE — Patient Instructions (Signed)
Please stop at the front desk to set up referral.  

## 2018-03-21 MED ORDER — ROPINIROLE HCL 0.5 MG PO TABS: 0.5000 mg | ORAL_TABLET | Freq: Every day | ORAL | 1 refills | Status: DC

## 2018-03-21 NOTE — Addendum Note (Signed)
Addended by: Damita LackLORING, Sarah Zerby S on: 03/21/2018 08:22 AM   Modules accepted: Orders

## 2018-03-22 ENCOUNTER — Telehealth: Payer: Self-pay

## 2018-03-22 NOTE — Telephone Encounter (Signed)
Started PA for desonide 0.05% oint, key:  [id number], Rx #:  [id number]. Decision pending.

## 2018-03-25 DIAGNOSIS — H029 Unspecified disorder of eyelid: Secondary | ICD-10-CM | POA: Diagnosis not present

## 2018-03-26 NOTE — Telephone Encounter (Signed)
Received PA denial stating none of the alternative medications have been tried, including: tramcinolone 0.1% oint, triamcinolone 0.5% oint, clobetasol 0.05% oint, fluocinonide 0.05% cream, etc.

## 2018-03-28 NOTE — Telephone Encounter (Signed)
This was just a requested refill for a chronic med used for other dermatitis in past. Not reason for OV. Call pt let him know A was denied.   FYI to Dr. GReece Agar

## 2018-03-28 NOTE — Telephone Encounter (Signed)
[name] notified by telephone that his insurance will not cover the desonide ointment.  He has not tried any of the covered alternatives.  He wants to research and find out what this would cost him out of pocket first.  He will call us back if he decides he would like us to send in a different ointment for him to try.

## 2018-03-29 ENCOUNTER — Other Ambulatory Visit: Payer: Self-pay | Admitting: Family Medicine

## 2018-04-16 ENCOUNTER — Other Ambulatory Visit: Payer: Self-pay

## 2018-04-16 MED ORDER — NAPROXEN 500 MG PO TABS
ORAL_TABLET | ORAL | 1 refills | Status: DC
Start: 2018-04-16 — End: 2018-10-08

## 2018-04-16 MED ORDER — FLUTICASONE PROPIONATE 50 MCG/ACT NA SUSP: 2.0000 | Freq: Every day | NASAL | 1 refills | Status: DC

## 2018-04-16 NOTE — Telephone Encounter (Signed)
Naproxen Last rx:  11/02/17, #180 Last OV:  03/19/18, acute Next OV:  08/16/18, CPE

## 2018-04-19 ENCOUNTER — Other Ambulatory Visit: Payer: Self-pay | Admitting: Family Medicine

## 2018-04-22 DIAGNOSIS — M1811 Unilateral primary osteoarthritis of first carpometacarpal joint, right hand: Secondary | ICD-10-CM | POA: Diagnosis not present

## 2018-04-22 DIAGNOSIS — M25531 Pain in right wrist: Secondary | ICD-10-CM | POA: Diagnosis not present

## 2018-04-22 DIAGNOSIS — M654 Radial styloid tenosynovitis [de Quervain]: Secondary | ICD-10-CM | POA: Diagnosis not present

## 2018-05-14 ENCOUNTER — Other Ambulatory Visit: Payer: Self-pay

## 2018-05-14 ENCOUNTER — Encounter: Payer: Self-pay | Admitting: Nurse Practitioner

## 2018-05-14 ENCOUNTER — Ambulatory Visit: Payer: Medicare Other | Attending: Nurse Practitioner | Admitting: Nurse Practitioner

## 2018-05-14 VITALS — BP 149/78 | HR 69 | Temp 98.0°F | Resp 18 | Ht 67.0 in | Wt 235.0 lb

## 2018-05-14 DIAGNOSIS — M25561 Pain in right knee: Secondary | ICD-10-CM | POA: Diagnosis not present

## 2018-05-14 DIAGNOSIS — Z6839 Body mass index (BMI) 39.0-39.9, adult: Secondary | ICD-10-CM | POA: Insufficient documentation

## 2018-05-14 DIAGNOSIS — N138 Other obstructive and reflux uropathy: Secondary | ICD-10-CM | POA: Insufficient documentation

## 2018-05-14 DIAGNOSIS — N401 Enlarged prostate with lower urinary tract symptoms: Secondary | ICD-10-CM | POA: Diagnosis not present

## 2018-05-14 DIAGNOSIS — M25562 Pain in left knee: Secondary | ICD-10-CM | POA: Diagnosis not present

## 2018-05-14 DIAGNOSIS — M25531 Pain in right wrist: Secondary | ICD-10-CM | POA: Diagnosis present

## 2018-05-14 DIAGNOSIS — E669 Obesity, unspecified: Secondary | ICD-10-CM | POA: Diagnosis not present

## 2018-05-14 DIAGNOSIS — Z950 Presence of cardiac pacemaker: Secondary | ICD-10-CM | POA: Insufficient documentation

## 2018-05-14 DIAGNOSIS — E785 Hyperlipidemia, unspecified: Secondary | ICD-10-CM | POA: Insufficient documentation

## 2018-05-14 DIAGNOSIS — Z7982 Long term (current) use of aspirin: Secondary | ICD-10-CM | POA: Diagnosis not present

## 2018-05-14 DIAGNOSIS — M19012 Primary osteoarthritis, left shoulder: Secondary | ICD-10-CM

## 2018-05-14 DIAGNOSIS — M533 Sacrococcygeal disorders, not elsewhere classified: Secondary | ICD-10-CM | POA: Insufficient documentation

## 2018-05-14 DIAGNOSIS — M4722 Other spondylosis with radiculopathy, cervical region: Secondary | ICD-10-CM | POA: Insufficient documentation

## 2018-05-14 DIAGNOSIS — G4733 Obstructive sleep apnea (adult) (pediatric): Secondary | ICD-10-CM | POA: Insufficient documentation

## 2018-05-14 DIAGNOSIS — G2581 Restless legs syndrome: Secondary | ICD-10-CM | POA: Diagnosis not present

## 2018-05-14 DIAGNOSIS — M5136 Other intervertebral disc degeneration, lumbar region: Secondary | ICD-10-CM | POA: Diagnosis not present

## 2018-05-14 DIAGNOSIS — F329 Major depressive disorder, single episode, unspecified: Secondary | ICD-10-CM | POA: Diagnosis not present

## 2018-05-14 DIAGNOSIS — F411 Generalized anxiety disorder: Secondary | ICD-10-CM | POA: Diagnosis not present

## 2018-05-14 DIAGNOSIS — K5903 Drug induced constipation: Secondary | ICD-10-CM | POA: Diagnosis not present

## 2018-05-14 DIAGNOSIS — M48061 Spinal stenosis, lumbar region without neurogenic claudication: Secondary | ICD-10-CM | POA: Insufficient documentation

## 2018-05-14 DIAGNOSIS — G4700 Insomnia, unspecified: Secondary | ICD-10-CM | POA: Insufficient documentation

## 2018-05-14 DIAGNOSIS — Z79899 Other long term (current) drug therapy: Secondary | ICD-10-CM | POA: Insufficient documentation

## 2018-05-14 DIAGNOSIS — M47816 Spondylosis without myelopathy or radiculopathy, lumbar region: Secondary | ICD-10-CM

## 2018-05-14 DIAGNOSIS — G894 Chronic pain syndrome: Secondary | ICD-10-CM | POA: Diagnosis not present

## 2018-05-14 DIAGNOSIS — Z87442 Personal history of urinary calculi: Secondary | ICD-10-CM | POA: Insufficient documentation

## 2018-05-14 DIAGNOSIS — Z7951 Long term (current) use of inhaled steroids: Secondary | ICD-10-CM | POA: Diagnosis not present

## 2018-05-14 DIAGNOSIS — Z981 Arthrodesis status: Secondary | ICD-10-CM | POA: Insufficient documentation

## 2018-05-14 DIAGNOSIS — M4802 Spinal stenosis, cervical region: Secondary | ICD-10-CM | POA: Diagnosis not present

## 2018-05-14 MED ORDER — OXYCODONE HCL ER 10 MG PO T12A: 10.0000 mg | EXTENDED_RELEASE_TABLET | Freq: Two times a day (BID) | ORAL | 0 refills | Status: DC

## 2018-05-14 MED ORDER — OXYCODONE HCL 5 MG PO TABS: 5.0000 mg | ORAL_TABLET | Freq: Four times a day (QID) | ORAL | 0 refills | Status: DC | PRN

## 2018-05-14 NOTE — Patient Instructions (Addendum)
____________________________________________________________________________________________  Medication Rules  Applies to: All patients receiving prescriptions (written or electronic).  Pharmacy of record: Pharmacy where electronic prescriptions will be sent. If written prescriptions are taken to a different pharmacy, please inform the nursing staff. The pharmacy listed in the electronic medical record should be the one where you would like electronic prescriptions to be sent.  Prescription refills: Only during scheduled appointments. Applies to both, written and electronic prescriptions.  NOTE: The following applies primarily to controlled substances (Opioid* Pain Medications).   Patient's responsibilities: 1. Pain Pills: Bring all pain pills to every appointment (except for procedure appointments). 2. Pill Bottles: Bring pills in original pharmacy bottle. Always bring newest bottle. Bring bottle, even if empty. 3. Medication refills: You are responsible for knowing and keeping track of what medications you need refilled. The day before your appointment, write a list of all prescriptions that need to be refilled. Bring that list to your appointment and give it to the admitting nurse. Prescriptions will be written only during appointments. If you forget a medication, it will not be "Called in", "Faxed", or "electronically sent". You will need to get another appointment to get these prescribed. 4. Prescription Accuracy: You are responsible for carefully inspecting your prescriptions before leaving our office. Have the discharge nurse carefully go over each prescription with you, before taking them home. Make sure that your name is accurately spelled, that your address is correct. Check the name and dose of your medication to make sure it is accurate. Check the number of pills, and the written instructions to make sure they are clear and accurate. Make sure that you are given enough medication to last  until your next medication refill appointment. 5. Taking Medication: Take medication as prescribed. Never take more pills than instructed. Never take medication more frequently than prescribed. Taking less pills or less frequently is permitted and encouraged, when it comes to controlled substances (written prescriptions).  6. Inform other Doctors: Always inform, all of your healthcare providers, of all the medications you take. 7. Pain Medication from other Providers: You are not allowed to accept any additional pain medication from any other Doctor or Healthcare provider. There are two exceptions to this rule. (see below) In the event that you require additional pain medication, you are responsible for notifying us, as stated below. 8. Medication Agreement: You are responsible for carefully reading and following our Medication Agreement. This must be signed before receiving any prescriptions from our practice. Safely store a copy of your signed Agreement. Violations to the Agreement will result in no further prescriptions. (Additional copies of our Medication Agreement are available upon request.) 9. Laws, Rules, & Regulations: All patients are expected to follow all Federal and State Laws, Statutes, Rules, & Regulations. Ignorance of the Laws does not constitute a valid excuse. The use of any illegal substances is prohibited. 10. Adopted CDC guidelines & recommendations: Target dosing levels will be at or below 60 MME/day. Use of benzodiazepines** is not recommended.  Exceptions: There are only two exceptions to the rule of not receiving pain medications from other Healthcare Providers. 1. Exception #1 (Emergencies): In the event of an emergency (i.e.: accident requiring emergency care), you are allowed to receive additional pain medication. However, you are responsible for: As soon as you are able, call our office [phone], at any time of the day or night, and leave a message stating your name, the  date and nature of the emergency, and the name and dose of the medication   prescribed. In the event that your call is answered by a member of our staff, make sure to document and save the date, time, and the name of the person that took your information.  2. Exception #2 (Planned Surgery): In the event that you are scheduled by another doctor or dentist to have any type of surgery or procedure, you are allowed (for a period no longer than 30 days), to receive additional pain medication, for the acute post-op pain. However, in this case, you are responsible for picking up a copy of our "Post-op Pain Management for Surgeons" handout, and giving it to your surgeon or dentist. This document is available at our office, and does not require an appointment to obtain it. Simply go to our office during business hours (Monday-Thursday from 8:00 AM to 4:00 PM) (Friday 8:00 AM to 12:00 Noon) or if you have a scheduled appointment with [place], prior to your surgery, and ask for it by name. In addition, you will need to provide [place] with your name, name of your surgeon, type of surgery, and date of procedure or surgery.  *Opioid medications include: morphine, codeine, oxycodone, oxymorphone, hydrocodone, hydromorphone, meperidine, tramadol, tapentadol, buprenorphine, fentanyl, methadone. **Benzodiazepine medications include: diazepam (Valium), alprazolam (Xanax), clonazepam (Klonopine), lorazepam (Ativan), clorazepate (Tranxene), chlordiazepoxide (Librium), estazolam (Prosom), oxazepam (Serax), temazepam (Restoril), triazolam (Halcion) (Last updated: 10/04/2017) ____________________________________________________________________________________________  oxycontin x3 and oxycodone x3 escribed

## 2018-05-14 NOTE — Progress Notes (Signed)
Nursing Pain Medication Assessment:  Safety precautions to be maintained throughout the outpatient stay will include: orient to surroundings, keep bed in low position, maintain call bell within reach at all times, provide assistance with transfer out of bed and ambulation.  Medication Inspection Compliance: Pill count conducted under aseptic conditions, in front of the patient. Neither the pills nor the bottle was removed from the patient's sight at any time. Once count was completed pills were immediately returned to the patient in their original bottle.  Medication #1: oxycodone 5mg  Pill/Patch Count: 37 of 120 pills remain Pill/Patch Appearance: Markings consistent with prescribed medication Bottle Appearance: Standard pharmacy container. Clearly labeled. Filled Date: 17 / 13 / 2019 Last Medication intake:  Today  Medication #2: oxycodone 10mg  Pill/Patch Count: 33 of 60 pills remain Pill/Patch Appearance: Markings consistent with prescribed medication Bottle Appearance: Standard pharmacy container. Clearly labeled. Filled Date: 23 / 13 / 2019 Last Medication intake:  Today

## 2018-05-14 NOTE — Progress Notes (Signed)
Patient's Name: Mark [name]  MRN: [id number]  Referring Provider: Ria Bush, MD  DOB: 1955-10-26  PCP: Ria Bush, MD  DOS: 05/14/2018  Note by: Vevelyn Francois NP  Service setting: Ambulatory outpatient  Specialty: Interventional Pain Management  Location: [facility] (AMB) Pain Management Facility    Patient type: Established    Primary Reason(s) for Visit: Encounter for prescription drug management. (Level of risk: moderate)  CC: Wrist Pain (right) and Neck Pain  HPI  Mark [name] is a 62 y.o. year old, male patient, who comes today for a medication management evaluation. He has Chronic pain syndrome; MDD (major depressive disorder), recurrent episode, moderate ([place]); Insomnia; GAD (generalized anxiety disorder); Medicare annual wellness visit, subsequent; Hyperlipidemia; Severe obesity (BMI 35.0-39.9) with comorbidity ([place]); Health maintenance examination; Candidal dermatitis; Benign prostatic hyperplasia with urinary obstruction; Nephrolithiasis; Bradycardia; Cardiac murmur; Opiate use (60 MME/Day); Encounter for therapeutic drug level monitoring; Chronic neck pain (Location of Primary Source of Pain) (Bilateral) (L>R); Chronic shoulder pain (Bilateral) (L>R); Chronic knee pain (Bilateral) (R>L); History of permanent cardiac pacemaker placement; Opioid-induced constipation (OIC); Radicular pain of shoulder (Bilateral) (L>R); 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); Cervicogenic headache; Cervical spondylosis with radiculopathy (Bilateral) (L>R); Cervical facet syndrome (Bilateral) (L>R); Cervical foraminal stenosis (Severe C6-7) (Left); Chronic sacroiliac joint pain (Bilateral); Osteoarthritis of sacroiliac joint (Bilateral); Lumbar facet hypertrophy (L1-2, L2-3, and L4-5) (Bilateral); Lumbar IVDD (intervertebral disc displacement); 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); Lumbar facet syndrome (Bilateral); Osteoarthritis of shoulder (Left); Osteoarthritis of knee  (Bilateral) (R>L); Obstructive sleep apnea; Hypocitraturia; Long term current use of opiate analgesic; Chronic cervical radicular pain; Advanced care planning/counseling discussion; Chronic fatigue; Low testosterone in male; RLS (restless legs syndrome); and Immunization deficiency on their problem list. His primarily concern today is the Wrist Pain (right) and Neck Pain  Pain Assessment: Location: Lower Neck Radiating: down left arm Duration: Chronic pain Quality: Aching, Sharp, Nagging, Discomfort Severity: 3 /10 (subjective, self-reported pain score)  Note: Reported level is compatible with observation.                          Effect on ADL: vacuuming, hold and carrying things Timing:   Modifying factors: rest BP: (!) 149/78  HR: 69  Mark [name] was last scheduled for an appointment on 02/11/2018 for medication management. During today's appointment we reviewed Mark [name]'s chronic pain status, as well as his outpatient medication regimen.  He admits that his pain is stable.  He denies any side effects of his medications.  The patient  reports that he does not use drugs. His body mass index is 36.81 kg/m.  Further details on both, my assessment(s), as well as the proposed treatment plan, please see below.  Controlled Substance Pharmacotherapy Assessment REMS (Risk Evaluation and Mitigation Strategy)  Analgesic:Oxycodone ER 10 mg every 12 hours (20 mg/day) + oxycodone IR 5 mg every 6 hours (20 mg/day) MME/day:60 mg/day Mark Specking, RN  05/14/2018  1:00 PM  Sign at close encounter Nursing Pain Medication Assessment:  Safety precautions to be maintained throughout the outpatient stay will include: orient to surroundings, keep bed in low position, maintain call bell within reach at all times, provide assistance with transfer out of bed and ambulation.  Medication Inspection Compliance: Pill count conducted under aseptic conditions, in front of the patient. Neither the pills nor the  bottle was removed from the patient's sight at any time. Once count was completed pills were immediately returned to the  patient in their original bottle.  Medication #1: oxycodone 21m Pill/Patch Count: 37 of 120 pills remain Pill/Patch Appearance: Markings consistent with prescribed medication Bottle Appearance: Standard pharmacy container. Clearly labeled. Filled Date: 964/ 13 / 2019 Last Medication intake:  Today  Medication #2: oxycodone 159mPill/Patch Count: 33 of 60 pills remain Pill/Patch Appearance: Markings consistent with prescribed medication Bottle Appearance: Standard pharmacy container. Clearly labeled. Filled Date: 9 41 13 / 2019 Last Medication intake:  Today   Pharmacokinetics: Liberation and absorption (onset of action): WNL Distribution (time to peak effect): WNL Metabolism and excretion (duration of action): WNL         Pharmacodynamics: Desired effects: Analgesia: Mark [name] >50% benefit. Functional ability: Patient reports that medication allows him to accomplish basic ADLs Clinically meaningful improvement in function (CMIF): Sustained CMIF goals met Perceived effectiveness: Described as relatively effective, allowing for increase in activities of daily living (ADL) Undesirable effects: Side-effects or Adverse reactions: None reported Monitoring: [place] PMP: Online review of the past 1220-monthriod conducted. Compliant with practice rules and regulations Last UDS on record: Summary  Date Value Ref Range Status  02/11/2018 FINAL  Final    Comment:    ==================================================================== TOXASSURE SELECT 13 (MW) ==================================================================== Test                             Result       Flag       Units Drug Present and Declared for Prescription Verification   Oxycodone                      1753         EXPECTED   ng/mg creat   Noroxycodone                   3105         EXPECTED    ng/mg creat    Sources of oxycodone include scheduled prescription medications.    Noroxycodone is an expected metabolite of oxycodone. ==================================================================== Test                      Result    Flag   Units      Ref Range   Creatinine              38               mg/dL      >=20 ==================================================================== Declared Medications:  The flagging and interpretation on this report are based on the  following declared medications.  Unexpected results may arise from  inaccuracies in the declared medications.  **Note: The testing scope of this panel includes these medications:  Oxycodone  **Note: The testing scope of this panel does not include following  reported medications:  Aspirin (Aspirin 81)  Atorvastatin (Lipitor)  Cetirizine  Desonide  Docusate (Colace)  Fluoxetine (Prozac)  Fluticasone (Flonase)  Furosemide (Lasix)  Multivitamin  Naloxone (Narcan)  Naproxen  Omeprazole  Sildenafil (Revatio)  Supplement  Supplement (Omega-3)  Vitamin D  Vitamin E ==================================================================== For clinical consultation, please call ([phone]===================================================================    UDS interpretation: Compliant          Medication Assessment Form: Reviewed. Patient indicates being compliant with therapy Treatment compliance: Compliant Risk Assessment Profile: Aberrant behavior: See prior evaluations. None observed or detected today Comorbid factors increasing risk of overdose: See prior notes. No additional risks  detected today Opioid risk tool (ORT) (Total Score): 3 Personal History of Substance Abuse (SUD-Substance use disorder):  Alcohol: Negative  Illegal Drugs: Negative  Rx Drugs: Negative  ORT Risk Level calculation: Low Risk Risk of substance use disorder (SUD): Low Opioid Risk Tool - 05/14/18 1256      Family  History of Substance Abuse   Alcohol  Negative    Illegal Drugs  Negative    Rx Drugs  Negative      Personal History of Substance Abuse   Alcohol  Negative    Illegal Drugs  Negative    Rx Drugs  Negative      Psychological Disease   Psychological Disease  Positive    OCD  Negative    Bipolar  Negative    Schizophrenia  Negative    Depression  Positive      Total Score   Opioid Risk Tool Scoring  3    Opioid Risk Interpretation  Low Risk      ORT Scoring interpretation table:  Score <3 = Low Risk for SUD  Score between 4-7 = Moderate Risk for SUD  Score >8 = High Risk for Opioid Abuse   Risk Mitigation Strategies:  Patient Counseling: Covered Patient-Prescriber Agreement (PPA): Present and active  Notification to other healthcare providers: Done  Pharmacologic Plan: No change in therapy, at this time.             Laboratory Chemistry  Inflammation Markers (CRP: Acute Phase) (ESR: Chronic Phase) Lab Results  Component Value Date   CRP 1.7 (H) 09/01/2015   ESRSEDRATE 6 09/01/2015                         Rheumatology Markers No results found for: RF, ANA, LABURIC, URICUR, LYMEIGGIGMAB, LYMEABIGMQN, HLAB27                      Renal Function Markers Lab Results  Component Value Date   BUN 25 (H) 12/06/2017   CREATININE 1.20 12/06/2017   GFRAA >60 12/06/2017   GFRNONAA >60 12/06/2017                             Hepatic Function Markers Lab Results  Component Value Date   AST 23 08/08/2017   ALT 25 08/08/2017   ALBUMIN 4.0 08/08/2017   ALKPHOS 54 08/08/2017   HCVAB NEGATIVE 07/22/2013                        Electrolytes Lab Results  Component Value Date   NA 138 12/06/2017   K 3.8 12/06/2017   CL 105 12/06/2017   CALCIUM 9.0 12/06/2017   MG 2.1 09/01/2015                        Neuropathy Markers Lab Results  Component Value Date   LGXQJJHE17 408 02/02/2016   HGBA1C 5.5 08/08/2017                        CNS Tests No results found for:  COLORCSF, APPEARCSF, RBCCOUNTCSF, WBCCSF, POLYSCSF, LYMPHSCSF, EOSCSF, PROTEINCSF, GLUCCSF, JCVIRUS, CSFOLI, IGGCSF                      Bone Pathology Markers Lab Results  Component Value Date   25OHVITD1 63 02/02/2016  25OHVITD2 <1.0 02/02/2016   25OHVITD3 63 02/02/2016   TESTOSTERONE 216.29 (L) 02/13/2017                         Coagulation Parameters Lab Results  Component Value Date   INR 1.14 12/06/2017   LABPROT 14.5 12/06/2017   APTT 32 12/06/2017   PLT 171 12/06/2017                        Cardiovascular Markers Lab Results  Component Value Date   HGB 14.7 12/06/2017   HCT 41.3 12/06/2017                         CA Markers No results found for: CEA, CA125, LABCA2                      Note: Lab results reviewed.  Recent Diagnostic Imaging Results  DG Chest 2 View CLINICAL DATA:  Preop  EXAM: CHEST - 2 VIEW  COMPARISON:  02/17/2008  FINDINGS: Double lead left subclavian pacemaker device and leads are stable and intact. Normal heart size. Clear lungs.  IMPRESSION: No active cardiopulmonary disease.  Electronically Signed   By: Marybelle Killings M.D.   On: 12/06/2017 16:13  Complexity Note: Imaging results reviewed. Results shared with Mr. Buchberger, using Layman's terms.                         Meds   Current Outpatient Medications:  .  aspirin 81 MG tablet, Take 81 mg by mouth daily.  , Disp: , Rfl:  .  atorvastatin (LIPITOR) 20 MG tablet, Take 1 tablet (20 mg total) by mouth daily. (Patient taking differently: Take 20 mg by mouth at bedtime. ), Disp: 90 tablet, Rfl: 0 .  cetirizine (ZYRTEC) 10 MG tablet, Take 10 mg by mouth at bedtime. , Disp: , Rfl:  .  Cholecalciferol (VITAMIN D) 2000 units CAPS, Take 2,000 Units by mouth daily. , Disp: , Rfl:  .  CRANBERRY PO, Take 4,200 mg by mouth at bedtime. , Disp: , Rfl:  .  desonide (DESOWEN) 0.05 % ointment, Apply 1 application topically 2 (two) times daily as needed (for rash on chin due to dryness)., Disp: 15  g, Rfl: 0 .  docusate sodium (COLACE) 100 MG capsule, Take 100 mg by mouth 2 (two) times daily., Disp: , Rfl:  .  FLUoxetine (PROZAC) 20 MG capsule, Take 1 capsule (20 mg total) by mouth daily., Disp: 90 capsule, Rfl: 1 .  FLUoxetine (PROZAC) 40 MG capsule, TAKE ONE CAPSULE BY MOUTH DAILY, Disp: 90 capsule, Rfl: 0 .  fluticasone (FLONASE) 50 MCG/ACT nasal spray, Place 2 sprays into both nostrils daily., Disp: 48 g, Rfl: 1 .  furosemide (LASIX) 20 MG tablet, TAKE 1 BY MOUTH DAILY, Disp: 90 tablet, Rfl: 3 .  Garlic 3149 MG CAPS, Take 1,000 mg by mouth daily. , Disp: , Rfl:  .  Multiple Vitamin (MULTIVITAMIN PO), Take 1 tablet by mouth daily.  , Disp: , Rfl:  .  Naloxone HCl (NARCAN) 4 MG/0.1ML LIQD, Spray into one nostril. Repeat with second device into other nostril after 2-3 minutes if no or minimal response., Disp: 2 each, Rfl: 0 .  naproxen (NAPROSYN) 500 MG tablet, TAKE 1 BY MOUTH TWICE DAILY WITH A MEAL, Disp: 180 tablet, Rfl: 1 .  Omega-3 1000 MG CAPS, Take  1,000 mg by mouth at bedtime., Disp: , Rfl:  .  omeprazole (PRILOSEC) 20 MG capsule, TAKE 1 BY MOUTH DAILY, Disp: 90 capsule, Rfl: 2 .  [START ON 07/18/2018] oxyCODONE (OXY IR/ROXICODONE) 5 MG immediate release tablet, Take 1 tablet (5 mg total) by mouth every 6 (six) hours as needed for severe pain., Disp: 120 tablet, Rfl: 0 .  [START ON 06/18/2018] oxyCODONE (OXYCONTIN) 10 mg 12 hr tablet, Take 1 tablet (10 mg total) by mouth every 12 (twelve) hours., Disp: 60 tablet, Rfl: 0 .  potassium citrate (UROCIT-K) 10 MEQ (1080 MG) SR tablet, Take 1 tablet (10 mEq total) by mouth 2 (two) times daily., Disp: 180 tablet, Rfl: 3 .  rOPINIRole (REQUIP) 0.5 MG tablet, Take 1 tablet (0.5 mg total) by mouth at bedtime. After 4 days may increase to 2 tablets at bedtime, Disp: 90 tablet, Rfl: 1 .  Saw Palmetto, Serenoa repens, (SAW PALMETTO PO), Take 450 mg by mouth 2 (two) times daily. , Disp: , Rfl:  .  sildenafil (REVATIO) 20 MG tablet, Take 60-100 mg by  mouth daily as needed (for erectile dysfunction.)., Disp: , Rfl:  .  VITAMIN B COMPLEX-C CAPS, Take by mouth daily. , Disp: , Rfl:  .  vitamin E 1000 UNIT capsule, Take 1,000 Units by mouth daily. , Disp: , Rfl:  .  [START ON 06/18/2018] oxyCODONE (OXY IR/ROXICODONE) 5 MG immediate release tablet, Take 1 tablet (5 mg total) by mouth every 6 (six) hours as needed for severe pain., Disp: 120 tablet, Rfl: 0 .  [START ON 05/19/2018] oxyCODONE (OXY IR/ROXICODONE) 5 MG immediate release tablet, Take 1 tablet (5 mg total) by mouth every 6 (six) hours as needed for severe pain., Disp: 120 tablet, Rfl: 0 .  [START ON 07/18/2018] oxyCODONE (OXYCONTIN) 10 mg 12 hr tablet, Take 1 tablet (10 mg total) by mouth every 12 (twelve) hours., Disp: 60 tablet, Rfl: 0 .  [START ON 05/19/2018] oxyCODONE (OXYCONTIN) 10 mg 12 hr tablet, Take 1 tablet (10 mg total) by mouth every 12 (twelve) hours., Disp: 60 tablet, Rfl: 0  ROS  Constitutional: Denies any fever or chills Gastrointestinal: No reported hemesis, hematochezia, vomiting, or acute GI distress Musculoskeletal: Denies any acute onset joint swelling, redness, loss of ROM, or weakness Neurological: No reported episodes of acute onset apraxia, aphasia, dysarthria, agnosia, amnesia, paralysis, loss of coordination, or loss of consciousness  Allergies  Mr. Qu is allergic to ciprofloxacin; metformin; buprenorphine hcl; morphine and related; and penicillins.  [place]  Drug: Mr. Bredeson  reports that he does not use drugs. Alcohol:  reports that he does not drink alcohol. Tobacco:  reports that he has never smoked. He has never used smokeless tobacco. Medical:  has a past medical history of Anxiety, Arthritis, Chronic pain, Depression, Heart murmur, History of kidney stones (April 12,2017), History of permanent cardiac pacemaker placement (05/31/2015), Hydrocele in adult (05/07/2015), Kidney stones, Migraine, MRSA carrier (2019), Presence of permanent cardiac pacemaker,  Prostatitis, Sleep apnea, and Sleep apnea. Surgical: Mr. Turnbaugh  has a past surgical history that includes Tonsillectomy (1964); Carpal tunnel release (Bilateral, 2006); Ulnar nerve repair (2008); Cervical spine surgery (2008); Rotator cuff repair (Left, 2009); pacemaker placement (2009); Lithotripsy (Bilateral, 2012); Lithotripsy (Left, 11/2015); Cardiovascular stress test (2014); Colonoscopy (07/2007); implantable cardioverter defibrillator (icd) generator change (Left, 12/13/2017); and pace maker revision (12/13/2017). Family: family history includes Arthritis in his father and mother; Cancer in his father and mother; Diabetes in his mother; Heart block in his father; Heart disease  in his father and mother.  Constitutional Exam  General appearance: Well nourished, well developed, and well hydrated. In no apparent acute distress Vitals:   05/14/18 1246  BP: (!) 149/78  Pulse: 69  Resp: 18  Temp: 98 F (36.7 C)  SpO2: 97%  Weight: 235 lb (106.6 kg)  Height: _0  (1.702 m)   BMI Assessment: Estimated body mass index is 36.81 kg/m as calculated from the following:   Height as of this encounter: _1  (1.702 m).   Weight as of this encounter: 235 lb (106.6 kg).  BMI interpretation table: BMI level Category Range association with higher incidence of chronic pain  <18 kg/m2 Underweight   18.5-24.9 kg/m2 Ideal body weight   25-29.9 kg/m2 Overweight Increased incidence by 20%  30-34.9 kg/m2 Obese (Class I) Increased incidence by 68%  35-39.9 kg/m2 Severe obesity (Class II) Increased incidence by 136%  >40 kg/m2 Extreme obesity (Class III) Increased incidence by 254%   Patient's current BMI Ideal Body weight  Body mass index is 36.81 kg/m. Ideal body weight: 66.1 kg (145 lb 11.6 oz) Adjusted ideal body weight: 82.3 kg (181 lb 6.9 oz)   BMI Readings from Last 4 Encounters:  05/14/18 36.81 kg/m  03/19/18 36.14 kg/m  02/11/18 35.71 kg/m  02/11/18 34.21 kg/m   Wt Readings from Last 4  Encounters:  05/14/18 235 lb (106.6 kg)  03/19/18 230 lb 12 oz (104.7 kg)  02/11/18 228 lb (103.4 kg)  02/11/18 225 lb (102.1 kg)  Psych/Mental status: Alert, oriented x 3 (person, place, & time)       Eyes: PERLA Respiratory: No evidence of acute respiratory distress  Cervical Spine Area Exam  Skin & Axial Inspection: No masses, redness, edema, swelling, or associated skin lesions Alignment: Symmetrical Functional ROM: Unrestricted ROM      Stability: No instability detected Muscle Tone/Strength: Functionally intact. No obvious neuro-muscular anomalies detected. Sensory (Neurological): Unimpaired Palpation: No palpable anomalies              Upper Extremity (UE) Exam    Side: Right upper extremity  Side: Left upper extremity  Skin & Extremity Inspection: Skin color, temperature, and hair growth are WNL. No peripheral edema or cyanosis. No masses, redness, swelling, asymmetry, or associated skin lesions. No contractures.  Skin & Extremity Inspection: Skin color, temperature, and hair growth are WNL. No peripheral edema or cyanosis. No masses, redness, swelling, asymmetry, or associated skin lesions. No contractures.  Functional ROM: Unrestricted ROM          Functional ROM: Unrestricted ROM          Muscle Tone/Strength: Functionally intact. No obvious neuro-muscular anomalies detected.  Muscle Tone/Strength: Functionally intact. No obvious neuro-muscular anomalies detected.  Sensory (Neurological): Unimpaired          Sensory (Neurological): Unimpaired          Palpation: No palpable anomalies              Palpation: No palpable anomalies              Provocative Test(s):  Phalen's test: deferred Tinel's test: deferred Apley's scratch test (touch opposite shoulder):  Action 1 (Across chest): deferred Action 2 (Overhead): deferred Action 3 (LB reach): deferred   Provocative Test(s):  Phalen's test: deferred Tinel's test: deferred Apley's scratch test (touch opposite shoulder):   Action 1 (Across chest): deferred Action 2 (Overhead): deferred Action 3 (LB reach): deferred    Gait & Posture Assessment  Ambulation: Unassisted Gait: Relatively  normal for age and body habitus Posture: WNL   Assessment  Primary Diagnosis & Pertinent Problem List: The primary encounter diagnosis was Cervical spondylosis with radiculopathy (Bilateral) (L>R). Diagnoses of Lumbar facet syndrome (Bilateral), Primary osteoarthritis of left shoulder, and Chronic pain syndrome were also pertinent to this visit.  Status Diagnosis  Controlled Controlled Controlled 1. Cervical spondylosis with radiculopathy (Bilateral) (L>R)   2. Lumbar facet syndrome (Bilateral)   3. Primary osteoarthritis of left shoulder   4. Chronic pain syndrome     Problems updated and reviewed during this visit: No problems updated. Plan of Care  Pharmacotherapy (Medications Ordered): Meds ordered this encounter  Medications  . oxyCODONE (OXYCONTIN) 10 mg 12 hr tablet    Sig: Take 1 tablet (10 mg total) by mouth every 12 (twelve) hours.    Dispense:  60 tablet    Refill:  0    Do not add this medication to the electronic "Automatic Refill" notification system. Patient may have prescription filled one day early if pharmacy is closed on scheduled refill date.    Order Specific Question:   Supervising Provider    Answer:   Milinda Pointer [phone]  . oxyCODONE (OXY IR/ROXICODONE) 5 MG immediate release tablet    Sig: Take 1 tablet (5 mg total) by mouth every 6 (six) hours as needed for severe pain.    Dispense:  120 tablet    Refill:  0    Do not add this medication to the electronic "Automatic Refill" notification system. Patient may have prescription filled one day early if pharmacy is closed on scheduled refill date.    Order Specific Question:   Supervising Provider    Answer:   Milinda Pointer [phone]  . oxyCODONE (OXYCONTIN) 10 mg 12 hr tablet    Sig: Take 1 tablet (10 mg total) by mouth every 12  (twelve) hours.    Dispense:  60 tablet    Refill:  0    Do not add this medication to the electronic "Automatic Refill" notification system. Patient may have prescription filled one day early if pharmacy is closed on scheduled refill date.    Order Specific Question:   Supervising Provider    Answer:   Milinda Pointer [phone]  . oxyCODONE (OXY IR/ROXICODONE) 5 MG immediate release tablet    Sig: Take 1 tablet (5 mg total) by mouth every 6 (six) hours as needed for severe pain.    Dispense:  120 tablet    Refill:  0    Do not add this medication to the electronic "Automatic Refill" notification system. Patient may have prescription filled one day early if pharmacy is closed on scheduled refill date.    Order Specific Question:   Supervising Provider    Answer:   Milinda Pointer [phone]  . oxyCODONE (OXY IR/ROXICODONE) 5 MG immediate release tablet    Sig: Take 1 tablet (5 mg total) by mouth every 6 (six) hours as needed for severe pain.    Dispense:  120 tablet    Refill:  0    Do not add this medication to the electronic "Automatic Refill" notification system. Patient may have prescription filled one day early if pharmacy is closed on scheduled refill date.    Order Specific Question:   Supervising Provider    Answer:   Milinda Pointer [phone]  . oxyCODONE (OXYCONTIN) 10 mg 12 hr tablet    Sig: Take 1 tablet (10 mg total) by mouth every 12 (twelve) hours.    Dispense:  60 tablet    Refill:  0    Do not add this medication to the electronic "Automatic Refill" notification system. Patient may have prescription filled one day early if pharmacy is closed on scheduled refill date.    Order Specific Question:   Supervising Provider    Answer:   Milinda Pointer [[id number]]   New Prescriptions   No medications on file   Medications administered today: Lj Miyamoto. [place] had no medications administered during this visit. Lab-work, procedure(s), and/or referral(s): No orders of the  defined types were placed in this encounter.  Imaging and/or referral(s): None  Interventional therapies: Planned, scheduled, and/or pending:  Not at this time. Gabapentin trial   Considering:  Complete a series of 5 Hyalgan intra-articular knee injections, bilaterally.  Diagnostic bilateral cervical facet block  Possible bilateral cervical facet radiofrequencyablation  Diagnostic left sided cervical epiduralsteroid injection  Diagnostic bilateral intra-articular knee injection Diagnostic bilateral genicular nerve blocks Possible bilateral genicular nerve radiofrequencyablation  Diagnostic bilateral sacroiliac joint block Possible bilateral sacroiliac joint radiofrequencyablation  Diagnostic bilateral intra-articular shoulder jointinjection  Diagnostic bilateral suprascapular nerve block Possible bilateral suprascapular nerve radiofrequencyablation  Diagnostic bilateral lumbar facet block Possible bilateral lumbar facet radiofrequencyablation  Diagnostic left L4-5 lumbar epidural steroid injection    Palliative PRN treatment(s):  Bilateral intra-articular Hyalgan knee injectionseries (injection #4)  Diagnostic bilateral cervical facetblock  Diagnostic left sided cervical epiduralsteroid injection  Diagnostic bilateral intra-articular knee injection Diagnostic bilateral genicular nerve blocks Diagnostic bilateral sacroiliac joint block Diagnostic bilateral intra-articular shoulder joint injection  Diagnostic bilateral suprascapularnerve block  Diagnostic bilateral lumbar facetblock  Diagnostic left L4-5 lumbar epidural steroid injection    Provider-requested follow-up: No follow-ups on file.  Future Appointments  Date Time Provider [place]  05/16/2018  9:30 AM [facility]  08/09/2018  8:05 AM [facility] LAB [facility]  08/15/2018  1:30 PM Vevelyn Francois, NP [facility] None  08/16/2018  2:30 PM Eustace Pen, LPN  [facility]  4/40/1027  3:00 PM Ria Bush, MD [facility]  08/29/2018  2:15 PM Stoioff, Ronda Fairly, MD [facility] None   Primary Care Physician: Ria Bush, MD Location: [facility] Note by: Vevelyn Francois NP Date: 05/14/2018; Time: 3:45 PM  Pain Score Disclaimer: We use the NRS-11 scale. This is a self-reported, subjective measurement of pain severity with only modest accuracy. It is used primarily to identify changes within a particular patient. It must be understood that outpatient pain scales are significantly less accurate that those used for research, where they can be applied under ideal controlled circumstances with minimal exposure to variables. In reality, the score is likely to be a combination of pain intensity and pain affect, where pain affect describes the degree of emotional arousal or changes in action readiness caused by the sensory experience of pain. Factors such as social and work situation, setting, emotional state, anxiety levels, expectation, and prior pain experience may influence pain perception and show large inter-individual differences that may also be affected by time variables.  Patient instructions provided during this appointment: Patient Instructions  ____________________________________________________________________________________________  Medication Rules  Applies to: All patients receiving prescriptions (written or electronic).  Pharmacy of record: Pharmacy where electronic prescriptions will be sent. If written prescriptions are taken to a different pharmacy, please inform the nursing staff. The pharmacy listed in the electronic medical record should be the one where you would like electronic prescriptions to be sent.  Prescription refills: Only during scheduled appointments. Applies to  both, written and electronic prescriptions.  NOTE: The following applies primarily to controlled substances (Opioid* Pain  Medications).   Patient's responsibilities: 1. Pain Pills: Bring all pain pills to every appointment (except for procedure appointments). 2. Pill Bottles: Bring pills in original pharmacy bottle. Always bring newest bottle. Bring bottle, even if empty. 3. Medication refills: You are responsible for knowing and keeping track of what medications you need refilled. The day before your appointment, write a list of all prescriptions that need to be refilled. Bring that list to your appointment and give it to the admitting nurse. Prescriptions will be written only during appointments. If you forget a medication, it will not be "Called in", "Faxed", or "electronically sent". You will need to get another appointment to get these prescribed. 4. Prescription Accuracy: You are responsible for carefully inspecting your prescriptions before leaving our office. Have the discharge nurse carefully go over each prescription with you, before taking them home. Make sure that your name is accurately spelled, that your address is correct. Check the name and dose of your medication to make sure it is accurate. Check the number of pills, and the written instructions to make sure they are clear and accurate. Make sure that you are given enough medication to last until your next medication refill appointment. 5. Taking Medication: Take medication as prescribed. Never take more pills than instructed. Never take medication more frequently than prescribed. Taking less pills or less frequently is permitted and encouraged, when it comes to controlled substances (written prescriptions).  6. Inform other Doctors: Always inform, all of your healthcare providers, of all the medications you take. 7. Pain Medication from other Providers: You are not allowed to accept any additional pain medication from any other Doctor or Healthcare provider. There are two exceptions to this rule. (see below) In the event that you require additional pain  medication, you are responsible for notifying us, as stated below. 8. Medication Agreement: You are responsible for carefully reading and following our Medication Agreement. This must be signed before receiving any prescriptions from our practice. Safely store a copy of your signed Agreement. Violations to the Agreement will result in no further prescriptions. (Additional copies of our Medication Agreement are available upon request.) 9. Laws, Rules, & Regulations: All patients are expected to follow all Federal and [employer], Rules, [employer]. Ignorance of the Laws does not constitute a valid excuse. The use of any illegal substances is prohibited. 10. Adopted CDC guidelines & recommendations: Target dosing levels will be at or below 60 MME/day. Use of benzodiazepines** is not recommended.  Exceptions: There are only two exceptions to the rule of not receiving pain medications from other Healthcare Providers. 1. Exception #1 (Emergencies): In the event of an emergency (i.e.: accident requiring emergency care), you are allowed to receive additional pain medication. However, you are responsible for: As soon as you are able, call our office [phone], at any time of the day or night, and leave a message stating your name, the date and nature of the emergency, and the name and dose of the medication prescribed. In the event that your call is answered by a member of our staff, make sure to document and save the date, time, and the name of the person that took your information.  2. Exception #2 (Planned Surgery): In the event that you are scheduled by another doctor or dentist to have any type of surgery or procedure, you are allowed (for a period no longer than  30 days), to receive additional pain medication, for the acute post-op pain. However, in this case, you are responsible for picking up a copy of our "Post-op Pain Management for Surgeons" handout, and giving it to your surgeon or  dentist. This document is available at our office, and does not require an appointment to obtain it. Simply go to our office during business hours (Monday-Thursday from 8:00 AM to 4:00 PM) (Friday 8:00 AM to 12:00 Noon) or if you have a scheduled appointment with [place], prior to your surgery, and ask for it by name. In addition, you will need to provide [place] with your name, name of your surgeon, type of surgery, and date of procedure or surgery.  *Opioid medications include: morphine, codeine, oxycodone, oxymorphone, hydrocodone, hydromorphone, meperidine, tramadol, tapentadol, buprenorphine, fentanyl, methadone. **Benzodiazepine medications include: diazepam (Valium), alprazolam (Xanax), clonazepam (Klonopine), lorazepam (Ativan), clorazepate (Tranxene), chlordiazepoxide (Librium), estazolam (Prosom), oxazepam (Serax), temazepam (Restoril), triazolam (Halcion) (Last updated: 10/04/2017) ____________________________________________________________________________________________  oxycontin x3 and oxycodone x3 escribed

## 2018-05-16 ENCOUNTER — Ambulatory Visit (INDEPENDENT_AMBULATORY_CARE_PROVIDER_SITE_OTHER): Payer: Medicare Other

## 2018-05-16 DIAGNOSIS — Z23 Encounter for immunization: Secondary | ICD-10-CM | POA: Diagnosis not present

## 2018-06-18 DIAGNOSIS — I495 Sick sinus syndrome: Secondary | ICD-10-CM | POA: Diagnosis not present

## 2018-07-15 ENCOUNTER — Other Ambulatory Visit: Payer: Self-pay

## 2018-07-15 MED ORDER — FLUOXETINE HCL 40 MG PO CAPS: 40.0000 mg | ORAL_CAPSULE | Freq: Every day | ORAL | 0 refills | Status: DC

## 2018-07-15 MED ORDER — ROPINIROLE HCL 0.5 MG PO TABS: 0.5000 mg | ORAL_TABLET | Freq: Every day | ORAL | 0 refills | Status: DC

## 2018-07-15 MED ORDER — OMEPRAZOLE 20 MG PO CPDR: DELAYED_RELEASE_CAPSULE | ORAL | 0 refills | Status: DC

## 2018-07-15 NOTE — Telephone Encounter (Signed)
Escribed

## 2018-08-08 ENCOUNTER — Other Ambulatory Visit: Payer: Self-pay | Admitting: Family Medicine

## 2018-08-08 DIAGNOSIS — N138 Other obstructive and reflux uropathy: Secondary | ICD-10-CM

## 2018-08-08 DIAGNOSIS — N401 Enlarged prostate with lower urinary tract symptoms: Secondary | ICD-10-CM

## 2018-08-08 DIAGNOSIS — E785 Hyperlipidemia, unspecified: Secondary | ICD-10-CM

## 2018-08-09 ENCOUNTER — Other Ambulatory Visit (INDEPENDENT_AMBULATORY_CARE_PROVIDER_SITE_OTHER): Payer: Medicare Other

## 2018-08-09 DIAGNOSIS — N401 Enlarged prostate with lower urinary tract symptoms: Secondary | ICD-10-CM

## 2018-08-09 DIAGNOSIS — E785 Hyperlipidemia, unspecified: Secondary | ICD-10-CM | POA: Diagnosis not present

## 2018-08-09 DIAGNOSIS — N138 Other obstructive and reflux uropathy: Secondary | ICD-10-CM

## 2018-08-09 LAB — LIPID PANEL
Cholesterol: 116 mg/dL (ref 0–200)
HDL: 37.2 mg/dL — ABNORMAL LOW (ref >39.00)
LDL Cholesterol: 49 mg/dL (ref 0–99)
NonHDL: 78.45
Total CHOL/HDL Ratio: 3
Triglycerides: 149 mg/dL (ref 0.0–149.0)
VLDL: 29.8 mg/dL (ref 0.0–40.0)

## 2018-08-09 LAB — COMPREHENSIVE METABOLIC PANEL
ALT: 26 U/L (ref 0–53)
AST: 23 U/L (ref 0–37)
Albumin: 3.9 g/dL (ref 3.5–5.2)
Alkaline Phosphatase: 52 U/L (ref 39–117)
BUN: 24 mg/dL — ABNORMAL HIGH (ref 6–23)
CO2: 27 mEq/L (ref 19–32)
Calcium: 8.9 mg/dL (ref 8.4–10.5)
Chloride: 102 mEq/L (ref 96–112)
Creatinine, Ser: 0.98 mg/dL (ref 0.40–1.50)
GFR: 82.48 mL/min (ref >60.00)
Glucose, Bld: 141 mg/dL — ABNORMAL HIGH (ref 70–99)
Potassium: 4 mEq/L (ref 3.5–5.1)
Sodium: 138 mEq/L (ref 135–145)
Total Bilirubin: 0.7 mg/dL (ref 0.2–1.2)
Total Protein: 5.9 g/dL — ABNORMAL LOW (ref 6.0–8.3)

## 2018-08-09 LAB — PSA: PSA: 0.74 ng/mL (ref 0.10–4.00)

## 2018-08-09 LAB — TSH: TSH: 2.05 u[IU]/mL (ref 0.35–4.50)

## 2018-08-15 ENCOUNTER — Other Ambulatory Visit: Payer: Self-pay

## 2018-08-15 ENCOUNTER — Ambulatory Visit: Payer: Medicare Other | Attending: Nurse Practitioner | Admitting: Nurse Practitioner

## 2018-08-15 ENCOUNTER — Encounter: Payer: Self-pay | Admitting: Nurse Practitioner

## 2018-08-15 ENCOUNTER — Encounter: Payer: Self-pay | Admitting: Family Medicine

## 2018-08-15 VITALS — BP 153/88 | HR 81 | Temp 98.0°F | Ht 68.0 in | Wt 235.0 lb

## 2018-08-15 DIAGNOSIS — M47816 Spondylosis without myelopathy or radiculopathy, lumbar region: Secondary | ICD-10-CM | POA: Insufficient documentation

## 2018-08-15 DIAGNOSIS — Z79891 Long term (current) use of opiate analgesic: Secondary | ICD-10-CM | POA: Insufficient documentation

## 2018-08-15 DIAGNOSIS — M4722 Other spondylosis with radiculopathy, cervical region: Secondary | ICD-10-CM | POA: Diagnosis not present

## 2018-08-15 DIAGNOSIS — G894 Chronic pain syndrome: Secondary | ICD-10-CM | POA: Insufficient documentation

## 2018-08-15 DIAGNOSIS — M65321 Trigger finger, right index finger: Secondary | ICD-10-CM | POA: Diagnosis not present

## 2018-08-15 MED ORDER — OXYCODONE HCL 5 MG PO TABS: 5.0000 mg | ORAL_TABLET | Freq: Four times a day (QID) | ORAL | 0 refills | Status: DC | PRN

## 2018-08-15 MED ORDER — OXYCODONE HCL ER 10 MG PO T12A: 10.0000 mg | EXTENDED_RELEASE_TABLET | Freq: Two times a day (BID) | ORAL | 0 refills | Status: DC

## 2018-08-15 NOTE — Progress Notes (Signed)
Patient's Name: Mark [name]  MRN: [id number]  Referring Provider: Ria Bush, MD  DOB: 1956/05/24  PCP: Ria Bush, MD  DOS: 08/15/2018  Note by: Vevelyn Francois NP  Service setting: Ambulatory outpatient  Specialty: Interventional Pain Management  Location: [facility] (AMB) Pain Management Facility    Patient type: Established    Primary Reason(s) for Visit: Encounter for prescription drug management. (Level of risk: moderate)  CC: Neck Pain  HPI  Mark [name] is a 63 y.o. year old, male patient, who comes today for a medication management evaluation. He has Chronic pain syndrome; MDD (major depressive disorder), recurrent episode, moderate ([place]); Insomnia; GAD (generalized anxiety disorder); Medicare annual wellness visit, subsequent; Hyperlipidemia; Severe obesity (BMI 35.0-39.9) with comorbidity ([place]); Health maintenance examination; Candidal dermatitis; Benign prostatic hyperplasia with urinary obstruction; Nephrolithiasis; Bradycardia; Cardiac murmur; Opiate use (60 MME/Day); Encounter for therapeutic drug level monitoring; Chronic neck pain (Location of Primary Source of Pain) (Bilateral) (L>R); Chronic shoulder pain (Bilateral) (L>R); Chronic knee pain (Bilateral) (R>L); History of permanent cardiac pacemaker placement; Opioid-induced constipation (OIC); Radicular pain of shoulder (Bilateral) (L>R); 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); Cervicogenic headache; Cervical spondylosis with radiculopathy (Bilateral) (L>R); Cervical facet syndrome (Bilateral) (L>R); Cervical foraminal stenosis (Severe C6-7) (Left); Chronic sacroiliac joint pain (Bilateral); Osteoarthritis of sacroiliac joint (Bilateral); Lumbar facet hypertrophy (L1-2, L2-3, and L4-5) (Bilateral); Lumbar IVDD (intervertebral disc displacement); 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); Lumbar facet syndrome (Bilateral); Osteoarthritis of shoulder (Left); Osteoarthritis of knee (Bilateral) (R>L); Obstructive  sleep apnea; Hypocitraturia; Long term current use of opiate analgesic; Chronic cervical radicular pain; Advanced care planning/counseling discussion; Chronic fatigue; Low testosterone in male; RLS (restless legs syndrome); Immunization deficiency; and Trigger finger, right index finger on their problem list. His primarily concern today is the Neck Pain  Pain Assessment: Location: Lower, Right, Left Neck(shoulder, both knee) Radiating: pain radiaties from neck to shoulder down the left arm, triggger finger on right hand locks up at times Onset: More than a month ago Duration: Chronic pain Quality: Aching, Burning, Throbbing Severity: 3 /10 (subjective, self-reported pain score)  Note: Reported level is compatible with observation.                          Effect on ADL: limits my daily activities Timing: Constant Modifying factors: medications nad rest BP: (!) 153/88  HR: 81  Mark [name] was last scheduled for an appointment on 05/14/2018 for medication management. During today's appointment we reviewed Mark [name]'s chronic pain status, as well as his outpatient medication regimen. He admits that he does not have any new concerns. He admits that he has increased stiffness in his right hand index finger. He feels like it is improving.   The patient  reports no history of drug use. His body mass index is 35.73 kg/m.  Further details on both, my assessment(s), as well as the proposed treatment plan, please see below.  Controlled Substance Pharmacotherapy Assessment REMS (Risk Evaluation and Mitigation Strategy)  Analgesic:Oxycodone ER 10 mg every 12 hours (20 mg/day) + oxycodone IR 5 mg every 6 hours (20 mg/day) MME/day:60 mg/day.  Mark Fischer, RN  08/15/2018  1:25 PM  Sign when Signing Visit Nursing Pain Medication Assessment:  Safety precautions to be maintained throughout the outpatient stay will include: orient to surroundings, keep bed in low position, maintain call bell within reach  at all times, provide assistance with transfer out of bed and ambulation.  Medication Inspection Compliance: Pill  count conducted under aseptic conditions, in front of the patient. Neither the pills nor the bottle was removed from the patient's sight at any time. Once count was completed pills were immediately returned to the patient in their original bottle.  Medication #1: Oxycodone ER (OxyContin) Pill/Patch Count: 24 of 60 pills remain Pill/Patch Appearance: Markings consistent with prescribed medication Bottle Appearance: Standard pharmacy container. Clearly labeled. Filled Date12 /12 / 2019 Last Medication intake:  Today  Medication #2: Oxycodone IR Pill/Patch Count: 31 of 120 pills remain Pill/Patch Appearance: Markings consistent with prescribed medication Bottle Appearance: Standard pharmacy container. Clearly labeled. Filled Date: 71 / 14 / 2019 Last Medication intake:  Today   Pharmacokinetics: Liberation and absorption (onset of action): WNL Distribution (time to peak effect): WNL Metabolism and excretion (duration of action): WNL         Pharmacodynamics: Desired effects: Analgesia: [name] reports >50% benefit. Functional ability: Patient reports that medication allows him to accomplish basic ADLs Clinically meaningful improvement in function (CMIF): Sustained CMIF goals met Perceived effectiveness: Described as relatively effective, allowing for increase in activities of daily living (ADL) Undesirable effects: Side-effects or Adverse reactions: Constipation OTC stool softner Monitoring: [place] PMP: Online review of the past 35-monthperiod conducted. Compliant with practice rules and regulations Last UDS on record: Summary  Date Value Ref Range Status  02/11/2018 FINAL  Final    Comment:    ==================================================================== TOXASSURE SELECT 13 (MW) ==================================================================== Test                              Result       Flag       Units Drug Present and Declared for Prescription Verification   Oxycodone                      1753         EXPECTED   ng/mg creat   Noroxycodone                   3105         EXPECTED   ng/mg creat    Sources of oxycodone include scheduled prescription medications.    Noroxycodone is an expected metabolite of oxycodone. ==================================================================== Test                      Result    Flag   Units      Ref Range   Creatinine              38               mg/dL      >=20 ==================================================================== Declared Medications:  The flagging and interpretation on this report are based on the  following declared medications.  Unexpected results may arise from  inaccuracies in the declared medications.  **Note: The testing scope of this panel includes these medications:  Oxycodone  **Note: The testing scope of this panel does not include following  reported medications:  Aspirin (Aspirin 81)  Atorvastatin (Lipitor)  Cetirizine  Desonide  Docusate (Colace)  Fluoxetine (Prozac)  Fluticasone (Flonase)  Furosemide (Lasix)  Multivitamin  Naloxone (Narcan)  Naproxen  Omeprazole  Sildenafil (Revatio)  Supplement  Supplement (Omega-3)  Vitamin D  Vitamin E ==================================================================== For clinical consultation, please call [phone] ====================================================================    UDS interpretation: Compliant          Medication Assessment  Form: Reviewed. Patient indicates being compliant with therapy Treatment compliance: Compliant Risk Assessment Profile: Aberrant behavior: See prior evaluations. None observed or detected today Comorbid factors increasing risk of overdose: See prior notes. No additional risks detected today Opioid risk tool (ORT) (Total Score): 0 Personal History of Substance  Abuse (SUD-Substance use disorder):  Alcohol: Negative  Illegal Drugs: Negative  Rx Drugs: Negative  ORT Risk Level calculation: Low Risk Risk of substance use disorder (SUD): Low Opioid Risk Tool - 08/15/18 1337      Family History of Substance Abuse   Alcohol  Negative    Illegal Drugs  Negative    Rx Drugs  Negative      Personal History of Substance Abuse   Alcohol  Negative    Illegal Drugs  Negative    Rx Drugs  Negative      Age   Age between 32-45 years   No      History of Preadolescent Sexual Abuse   History of Preadolescent Sexual Abuse  Negative or Male      Psychological Disease   Psychological Disease  Negative    Depression  Negative      Total Score   Opioid Risk Tool Scoring  0    Opioid Risk Interpretation  Low Risk      ORT Scoring interpretation table:  Score <3 = Low Risk for SUD  Score between 4-7 = Moderate Risk for SUD  Score >8 = High Risk for Opioid Abuse   Risk Mitigation Strategies:  Patient Counseling: Covered Patient-Prescriber Agreement (PPA): Present and active  Notification to other healthcare providers: Done  Pharmacologic Plan: No change in therapy, at this time.             Laboratory Chemistry  Inflammation Markers (CRP: Acute Phase) (ESR: Chronic Phase) Lab Results  Component Value Date   CRP 1.7 (H) 09/01/2015   ESRSEDRATE 6 09/01/2015                         Rheumatology Markers No results found for: RF, ANA, LABURIC, URICUR, LYMEIGGIGMAB, LYMEABIGMQN, HLAB27                      Renal Function Markers Lab Results  Component Value Date   BUN 24 (H) 08/09/2018   CREATININE 0.98 08/09/2018   GFRAA >60 12/06/2017   GFRNONAA >60 12/06/2017                             Hepatic Function Markers Lab Results  Component Value Date   AST 23 08/09/2018   ALT 26 08/09/2018   ALBUMIN 3.9 08/09/2018   ALKPHOS 52 08/09/2018   HCVAB NEGATIVE 07/22/2013                        Electrolytes Lab Results  Component Value  Date   NA 138 08/09/2018   K 4.0 08/09/2018   CL 102 08/09/2018   CALCIUM 8.9 08/09/2018   MG 2.1 09/01/2015                        Neuropathy Markers Lab Results  Component Value Date   VITAMINB12 861 02/02/2016   HGBA1C 5.5 08/16/2018                        CNS  Tests No results found for: COLORCSF, APPEARCSF, RBCCOUNTCSF, WBCCSF, POLYSCSF, LYMPHSCSF, EOSCSF, PROTEINCSF, GLUCCSF, JCVIRUS, CSFOLI, IGGCSF                      Bone Pathology Markers Lab Results  Component Value Date   25OHVITD1 63 02/02/2016   25OHVITD2 <1.0 02/02/2016   25OHVITD3 63 02/02/2016   TESTOSTERONE 216.29 (L) 02/13/2017                         Coagulation Parameters Lab Results  Component Value Date   INR 1.14 12/06/2017   LABPROT 14.5 12/06/2017   APTT 32 12/06/2017   PLT 171 12/06/2017                        Cardiovascular Markers Lab Results  Component Value Date   HGB 14.7 12/06/2017   HCT 41.3 12/06/2017                         CA Markers No results found for: CEA, CA125, LABCA2                      Note: Lab results reviewed.  Recent Diagnostic Imaging Results  DG Chest 2 View CLINICAL DATA:  Preop  EXAM: CHEST - 2 VIEW  COMPARISON:  02/17/2008  FINDINGS: Double lead left subclavian pacemaker device and leads are stable and intact. Normal heart size. Clear lungs.  IMPRESSION: No active cardiopulmonary disease.  Electronically Signed   By: Marybelle Killings M.D.   On: 12/06/2017 16:13  Complexity Note: Imaging results reviewed. Results shared with Mr. Usery, using Layman's terms.                         Meds   Current Outpatient Medications:  .  aspirin 81 MG tablet, Take 81 mg by mouth daily.  , Disp: , Rfl:  .  cetirizine (ZYRTEC) 10 MG tablet, Take 10 mg by mouth at bedtime. , Disp: , Rfl:  .  Cholecalciferol (VITAMIN D) 2000 units CAPS, Take 2,000 Units by mouth daily. , Disp: , Rfl:  .  CRANBERRY PO, Take 4,200 mg by mouth at bedtime. , Disp: , Rfl:  .   desonide (DESOWEN) 0.05 % ointment, Apply 1 application topically 2 (two) times daily as needed (for rash on chin due to dryness)., Disp: 15 g, Rfl: 0 .  docusate sodium (COLACE) 100 MG capsule, Take 100 mg by mouth 2 (two) times daily., Disp: , Rfl:  .  Garlic 8242 MG CAPS, Take 1,000 mg by mouth daily. , Disp: , Rfl:  .  Multiple Vitamin (MULTIVITAMIN PO), Take 1 tablet by mouth daily.  , Disp: , Rfl:  .  Naloxone HCl (NARCAN) 4 MG/0.1ML LIQD, Spray into one nostril. Repeat with second device into other nostril after 2-3 minutes if no or minimal response., Disp: 2 each, Rfl: 0 .  naproxen (NAPROSYN) 500 MG tablet, TAKE 1 BY MOUTH TWICE DAILY WITH A MEAL, Disp: 180 tablet, Rfl: 1 .  Omega-3 1000 MG CAPS, Take 1,000 mg by mouth at bedtime., Disp: , Rfl:  .  [START ON 10/18/2018] oxyCODONE (OXY IR/ROXICODONE) 5 MG immediate release tablet, Take 1 tablet (5 mg total) by mouth every 6 (six) hours as needed for severe pain., Disp: 120 tablet, Rfl: 0 .  potassium citrate (UROCIT-K) 10 MEQ (1080 MG)  SR tablet, Take 1 tablet (10 mEq total) by mouth 2 (two) times daily., Disp: 180 tablet, Rfl: 3 .  Saw Palmetto, Serenoa repens, (SAW PALMETTO PO), Take 450 mg by mouth 2 (two) times daily. , Disp: , Rfl:  .  sildenafil (REVATIO) 20 MG tablet, Take 60-100 mg by mouth daily as needed (for erectile dysfunction.)., Disp: , Rfl:  .  VITAMIN B COMPLEX-C CAPS, Take by mouth daily. , Disp: , Rfl:  .  vitamin E 1000 UNIT capsule, Take 1,000 Units by mouth daily. , Disp: , Rfl:  .  atorvastatin (LIPITOR) 20 MG tablet, Take 1 tablet (20 mg total) by mouth at bedtime., Disp: 90 tablet, Rfl: 3 .  diphenhydrAMINE (BENADRYL) 50 MG capsule, Take 50 mg by mouth at bedtime., Disp: , Rfl:  .  FERROUS SULFATE PO, Take 325 mg by mouth daily., Disp: , Rfl:  .  FLUoxetine (PROZAC) 20 MG capsule, Take 1 capsule (20 mg total) by mouth daily., Disp: 90 capsule, Rfl: 3 .  FLUoxetine (PROZAC) 40 MG capsule, Take 1 capsule (40 mg total)  by mouth daily., Disp: 90 capsule, Rfl: 3 .  fluticasone (FLONASE) 50 MCG/ACT nasal spray, Place 2 sprays into both nostrils daily., Disp: 48 g, Rfl: 3 .  furosemide (LASIX) 20 MG tablet, TAKE 1 BY MOUTH DAILY, Disp: 90 tablet, Rfl: 3 .  omeprazole (PRILOSEC) 20 MG capsule, TAKE 1 BY MOUTH DAILY, Disp: 90 capsule, Rfl: 3 .  [START ON 09/18/2018] oxyCODONE (OXY IR/ROXICODONE) 5 MG immediate release tablet, Take 1 tablet (5 mg total) by mouth every 6 (six) hours as needed for severe pain., Disp: 120 tablet, Rfl: 0 .  [START ON 08/19/2018] oxyCODONE (OXY IR/ROXICODONE) 5 MG immediate release tablet, Take 1 tablet (5 mg total) by mouth every 6 (six) hours as needed for severe pain., Disp: 120 tablet, Rfl: 0 .  [START ON 10/17/2018] oxyCODONE (OXYCONTIN) 10 mg 12 hr tablet, Take 1 tablet (10 mg total) by mouth every 12 (twelve) hours., Disp: 60 tablet, Rfl: 0 .  [START ON 09/16/2018] oxyCODONE (OXYCONTIN) 10 mg 12 hr tablet, Take 1 tablet (10 mg total) by mouth every 12 (twelve) hours., Disp: 60 tablet, Rfl: 0 .  [START ON 08/17/2018] oxyCODONE (OXYCONTIN) 10 mg 12 hr tablet, Take 1 tablet (10 mg total) by mouth every 12 (twelve) hours., Disp: 60 tablet, Rfl: 0 .  rOPINIRole (REQUIP) 0.5 MG tablet, Take 1 tablet (0.5 mg total) by mouth at bedtime., Disp: 90 tablet, Rfl: 3  ROS  Constitutional: Denies any fever or chills Gastrointestinal: No reported hemesis, hematochezia, vomiting, or acute GI distress Musculoskeletal: Denies any acute onset joint swelling, redness, loss of ROM, or weakness Neurological: No reported episodes of acute onset apraxia, aphasia, dysarthria, agnosia, amnesia, paralysis, loss of coordination, or loss of consciousness  Allergies  Mr. Minshall is allergic to ciprofloxacin; metformin; buprenorphine hcl; morphine and related; and penicillins.  PFSH  Drug: Mr. Maharaj  reports no history of drug use. Alcohol:  reports no history of alcohol use. Tobacco:  reports that he has never  smoked. He has never used smokeless tobacco. Medical:  has a past medical history of Anxiety, Arthritis, Chronic pain, Depression, Heart murmur, History of kidney stones (April 12,2017), History of permanent cardiac pacemaker placement (05/31/2015), Hydrocele in adult (05/07/2015), Kidney stones, Migraine, MRSA carrier (2019), Presence of permanent cardiac pacemaker, Prostatitis, Sleep apnea, and Sleep apnea. Surgical: Mr. Carchi  has a past surgical history that includes Tonsillectomy (1964); Carpal tunnel release (Bilateral, 2006);  Ulnar nerve repair (2008); Cervical spine surgery (2008); Rotator cuff repair (Left, 2009); pacemaker placement (2009); Lithotripsy (Bilateral, 2012); Lithotripsy (Left, 11/2015); Cardiovascular stress test (2014); Colonoscopy (07/2007); implantable cardioverter defibrillator (icd) generator change (Left, 12/13/2017); and pace maker revision (12/13/2017). Family: family history includes Arthritis in his father and mother; Cancer in his father and mother; Diabetes in his mother; Heart block in his father; Heart disease in his father and mother.  Constitutional Exam  General appearance: Well nourished, well developed, and well hydrated. In no apparent acute distress Vitals:   08/15/18 1328  BP: (!) 153/88  Pulse: 81  Temp: 98 F (36.7 C)  SpO2: 99%  Weight: 235 lb (106.6 kg)  Height: '5\' 8"'$  (1.727 m)  Psych/Mental status: Alert, oriented x 3 (person, place, & time)       Eyes: PERLA Respiratory: No evidence of acute respiratory distress  Cervical Spine Area Exam  Skin & Axial Inspection: No masses, redness, edema, swelling, or associated skin lesions Alignment: Symmetrical Functional ROM: Unrestricted ROM      Stability: No instability detected Muscle Tone/Strength: Functionally intact. No obvious neuro-muscular anomalies detected. Sensory (Neurological): Unimpaired Palpation: No palpable anomalies              Upper Extremity (UE) Exam    Side: Right upper  extremity  Side: Left upper extremity  Skin & Extremity Inspection: Edema  Skin & Extremity Inspection: Skin color, temperature, and hair growth are WNL. No peripheral edema or cyanosis. No masses, redness, swelling, asymmetry, or associated skin lesions. No contractures.  Functional ROM: Decreased ROM for hand digit 2  Functional ROM: Unrestricted ROM          Muscle Tone/Strength: Functionally intact. No obvious neuro-muscular anomalies detected.  Muscle Tone/Strength: Functionally intact. No obvious neuro-muscular anomalies detected.  Sensory (Neurological): Unimpaired          Sensory (Neurological): Unimpaired          Palpation: No palpable anomalies              Palpation: No palpable anomalies                   Thoracic Spine Area Exam  Skin & Axial Inspection: No masses, redness, or swelling Alignment: Symmetrical Functional ROM: Unrestricted ROM Stability: No instability detected Muscle Tone/Strength: Functionally intact. No obvious neuro-muscular anomalies detected. Sensory (Neurological): Unimpaired Muscle strength & Tone: No palpable anomalies  Lumbar Spine Area Exam  Skin & Axial Inspection: No masses, redness, or swelling Alignment: Symmetrical Functional ROM: Unrestricted ROM       Stability: No instability detected Muscle Tone/Strength: Functionally intact. No obvious neuro-muscular anomalies detected. Sensory (Neurological): Unimpaired Palpation: No palpable anomalies       Provocative Tests: Hyperextension/rotation test: deferred today       Lumbar quadrant test (Kemp's test): deferred today       Lateral bending test: deferred today       Patrick's Maneuver: deferred today                    Gait & Posture Assessment  Ambulation: Unassisted Gait: Relatively normal for age and body habitus Posture: WNL   Lower Extremity Exam    Side: Right lower extremity  Side: Left lower extremity  Stability: No instability observed          Stability: No instability  observed          Skin & Extremity Inspection: Skin color, temperature, and hair growth are WNL.  No peripheral edema or cyanosis. No masses, redness, swelling, asymmetry, or associated skin lesions. No contractures.  Skin & Extremity Inspection: Skin color, temperature, and hair growth are WNL. No peripheral edema or cyanosis. No masses, redness, swelling, asymmetry, or associated skin lesions. No contractures.  Functional ROM: Unrestricted ROM                  Functional ROM: Unrestricted ROM                  Muscle Tone/Strength: Functionally intact. No obvious neuro-muscular anomalies detected.  Muscle Tone/Strength: Functionally intact. No obvious neuro-muscular anomalies detected.  Sensory (Neurological): Unimpaired        Sensory (Neurological): Unimpaired                 Assessment  Primary Diagnosis & Pertinent Problem List: The primary encounter diagnosis was Cervical spondylosis with radiculopathy (Bilateral) (L>R). Diagnoses of Lumbar facet hypertrophy (L1-2, L2-3, and L4-5) (Bilateral), Trigger finger, right index finger, Chronic pain syndrome, and Long term prescription opiate use were also pertinent to this visit.  Status Diagnosis  Controlled Controlled Controlled 1. Cervical spondylosis with radiculopathy (Bilateral) (L>R)   2. Lumbar facet hypertrophy (L1-2, L2-3, and L4-5) (Bilateral)   3. Trigger finger, right index finger   4. Chronic pain syndrome   5. Long term prescription opiate use     Problems updated and reviewed during this visit: Problem  Trigger Finger, Right Index Finger   Plan of Care  Pharmacotherapy (Medications Ordered): Meds ordered this encounter  Medications  . oxyCODONE (OXYCONTIN) 10 mg 12 hr tablet    Sig: Take 1 tablet (10 mg total) by mouth every 12 (twelve) hours.    Dispense:  60 tablet    Refill:  0    Do not add this medication to the electronic "Automatic Refill" notification system. Patient may have prescription filled one day early  if pharmacy is closed on scheduled refill date.    Order Specific Question:   Supervising Provider    Answer:   Milinda Pointer [phone]  . oxyCODONE (OXY IR/ROXICODONE) 5 MG immediate release tablet    Sig: Take 1 tablet (5 mg total) by mouth every 6 (six) hours as needed for severe pain.    Dispense:  120 tablet    Refill:  0    Do not add this medication to the electronic "Automatic Refill" notification system. Patient may have prescription filled one day early if pharmacy is closed on scheduled refill date.    Order Specific Question:   Supervising Provider    Answer:   Milinda Pointer [phone]  . oxyCODONE (OXY IR/ROXICODONE) 5 MG immediate release tablet    Sig: Take 1 tablet (5 mg total) by mouth every 6 (six) hours as needed for severe pain.    Dispense:  120 tablet    Refill:  0    Do not add this medication to the electronic "Automatic Refill" notification system. Patient may have prescription filled one day early if pharmacy is closed on scheduled refill date.    Order Specific Question:   Supervising Provider    Answer:   Milinda Pointer [phone]  . oxyCODONE (OXY IR/ROXICODONE) 5 MG immediate release tablet    Sig: Take 1 tablet (5 mg total) by mouth every 6 (six) hours as needed for severe pain.    Dispense:  120 tablet    Refill:  0    Do not add this medication to the electronic "Automatic Refill"  notification system. Patient may have prescription filled one day early if pharmacy is closed on scheduled refill date.    Order Specific Question:   Supervising Provider    Answer:   Milinda Pointer [phone]  . oxyCODONE (OXYCONTIN) 10 mg 12 hr tablet    Sig: Take 1 tablet (10 mg total) by mouth every 12 (twelve) hours.    Dispense:  60 tablet    Refill:  0    Do not add this medication to the electronic "Automatic Refill" notification system. Patient may have prescription filled one day early if pharmacy is closed on scheduled refill date.    Order Specific  Question:   Supervising Provider    Answer:   Milinda Pointer [phone]  . oxyCODONE (OXYCONTIN) 10 mg 12 hr tablet    Sig: Take 1 tablet (10 mg total) by mouth every 12 (twelve) hours.    Dispense:  60 tablet    Refill:  0    Do not add this medication to the electronic "Automatic Refill" notification system. Patient may have prescription filled one day early if pharmacy is closed on scheduled refill date.    Order Specific Question:   Supervising Provider    Answer:   Milinda Pointer [[id number]]   New Prescriptions   No medications on file   Medications administered today: Truth Barot. Siverson had no medications administered during this visit. Lab-work, procedure(s), and/or referral(s): Orders Placed This Encounter  Procedures  . ToxASSURE Select 13 (MW), Urine   Imaging and/or referral(s): None  Interventional therapies: Planned, scheduled, and/or pending:  Not at this time.   Considering:  Diagnostic right hand digit 2 trigger finger injection Complete a series of 5 Hyalgan intra-articular knee injections, bilaterally.  Diagnostic bilateral cervical facet block  Possible bilateral cervical facet radiofrequencyablation  Diagnostic left sided cervical epiduralsteroid injection  Diagnostic bilateral intra-articular knee injection Diagnostic bilateral genicular nerve blocks Possible bilateral genicular nerve radiofrequencyablation  Diagnostic bilateral sacroiliac joint block Possible bilateral sacroiliac joint radiofrequencyablation  Diagnostic bilateral intra-articular shoulder jointinjection  Diagnostic bilateral suprascapular nerve block Possible bilateral suprascapular nerve radiofrequencyablation  Diagnostic bilateral lumbar facet block Possible bilateral lumbar facet radiofrequencyablation  Diagnostic left L4-5 lumbar epidural steroid injection    Palliative PRN treatment(s):  Bilateral intra-articular Hyalgan knee injectionseries (injection #4)   Diagnostic bilateral cervical facetblock  Diagnostic left sided cervical epiduralsteroid injection  Diagnostic bilateral intra-articular knee injection Diagnostic bilateral genicular nerve blocks Diagnostic bilateral sacroiliac joint block Diagnostic bilateral intra-articular shoulder joint injection  Diagnostic bilateral suprascapularnerve block  Diagnostic bilateral lumbar facetblock  Diagnostic left L4-5 lumbar epidural steroid injection    Provider-requested follow-up: Return in about 3 months (around 11/14/2018) for MedMgmt.  Future Appointments  Date Time Provider [place]  09/10/2018  2:15 PM Abbie Sons, MD [facility] None  11/13/2018  1:30 PM Vevelyn Francois, NP [facility] None  08/13/2019  8:00 AM [facility]  08/20/2019  2:30 PM Eustace Pen, LPN [facility]  11/18/2438  3:00 PM Ria Bush, MD [facility]   Primary Care Physician: Ria Bush, MD Location: [facility] Note by: Vevelyn Francois NP Date: 08/15/2018; Time: 7:03 PM  Pain Score Disclaimer: We use the NRS-11 scale. This is a self-reported, subjective measurement of pain severity with only modest accuracy. It is used primarily to identify changes within a particular patient. It must be understood that outpatient pain scales are significantly less accurate that those used for research, where they can be applied  under ideal controlled circumstances with minimal exposure to variables. In reality, the score is likely to be a combination of pain intensity and pain affect, where pain affect describes the degree of emotional arousal or changes in action readiness caused by the sensory experience of pain. Factors such as social and work situation, setting, emotional state, anxiety levels, expectation, and prior pain experience may influence pain perception and show large inter-individual differences that may also be affected by time variables.  Patient  instructions provided during this appointment: Patient Instructions   ____________________________________________________________________________________________  Medication Rules  Purpose: To inform patients, and their family members, of our rules and regulations.  Applies to: All patients receiving prescriptions (written or electronic).  Pharmacy of record: Pharmacy where electronic prescriptions will be sent. If written prescriptions are taken to a different pharmacy, please inform the nursing staff. The pharmacy listed in the electronic medical record should be the one where you would like electronic prescriptions to be sent.  Electronic prescriptions: In compliance with the [place] (STOP) Act of 2017 (Session Lanny Cramp [phone]), effective August 07, 2018, all controlled substances must be electronically prescribed. Calling prescriptions to the pharmacy will cease to exist.  Prescription refills: Only during scheduled appointments. Applies to all prescriptions.  NOTE: The following applies primarily to controlled substances (Opioid* Pain Medications).   Patient's responsibilities: 1. Pain Pills: Bring all pain pills to every appointment (except for procedure appointments). 2. Pill Bottles: Bring pills in original pharmacy bottle. Always bring the newest bottle. Bring bottle, even if empty. 3. Medication refills: You are responsible for knowing and keeping track of what medications you take and those you need refilled. The day before your appointment: write a list of all prescriptions that need to be refilled. The day of the appointment: give the list to the admitting nurse. Prescriptions will be written only during appointments. If you forget a medication: it will not be "Called in", "Faxed", or "electronically sent". You will need to get another appointment to get these prescribed. No early refills. Do not call asking to have your prescription  filled early. 4. Prescription Accuracy: You are responsible for carefully inspecting your prescriptions before leaving our office. Have the discharge nurse carefully go over each prescription with you, before taking them home. Make sure that your name is accurately spelled, that your address is correct. Check the name and dose of your medication to make sure it is accurate. Check the number of pills, and the written instructions to make sure they are clear and accurate. Make sure that you are given enough medication to last until your next medication refill appointment. 5. Taking Medication: Take medication as prescribed. When it comes to controlled substances, taking less pills or less frequently than prescribed is permitted and encouraged. Never take more pills than instructed. Never take medication more frequently than prescribed.  6. Inform other Doctors: Always inform, all of your healthcare providers, of all the medications you take. 7. Pain Medication from other Providers: You are not allowed to accept any additional pain medication from any other Doctor or Healthcare provider. There are two exceptions to this rule. (see below) In the event that you require additional pain medication, you are responsible for notifying us, as stated below. 8. Medication Agreement: You are responsible for carefully reading and following our Medication Agreement. This must be signed before receiving any prescriptions from our practice. Safely store a copy of your signed Agreement. Violations to the Agreement will result in no further  prescriptions. (Additional copies of our Medication Agreement are available upon request.) 9. Laws, Rules, & Regulations: All patients are expected to follow all Federal and [employer], [employer], Rules, [employer]. Ignorance of the Laws does not constitute a valid excuse. The use of any illegal substances is prohibited. 10. Adopted CDC guidelines & recommendations: Target dosing levels  will be at or below 60 MME/day. Use of benzodiazepines** is not recommended.  Exceptions: There are only two exceptions to the rule of not receiving pain medications from other Healthcare Providers. 1. Exception #1 (Emergencies): In the event of an emergency (i.e.: accident requiring emergency care), you are allowed to receive additional pain medication. However, you are responsible for: As soon as you are able, call our office [phone], at any time of the day or night, and leave a message stating your name, the date and nature of the emergency, and the name and dose of the medication prescribed. In the event that your call is answered by a member of our staff, make sure to document and save the date, time, and the name of the person that took your information.  2. Exception #2 (Planned Surgery): In the event that you are scheduled by another doctor or dentist to have any type of surgery or procedure, you are allowed (for a period no longer than 30 days), to receive additional pain medication, for the acute post-op pain. However, in this case, you are responsible for picking up a copy of our "Post-op Pain Management for Surgeons" handout, and giving it to your surgeon or dentist. This document is available at our office, and does not require an appointment to obtain it. Simply go to our office during business hours (Monday-Thursday from 8:00 AM to 4:00 PM) (Friday 8:00 AM to 12:00 Noon) or if you have a scheduled appointment with [place], prior to your surgery, and ask for it by name. In addition, you will need to provide [place] with your name, name of your surgeon, type of surgery, and date of procedure or surgery.  *Opioid medications include: morphine, codeine, oxycodone, oxymorphone, hydrocodone, hydromorphone, meperidine, tramadol, tapentadol, buprenorphine, fentanyl, methadone. **Benzodiazepine medications include: diazepam (Valium), alprazolam (Xanax), clonazepam (Klonopine), lorazepam (Ativan),  clorazepate (Tranxene), chlordiazepoxide (Librium), estazolam (Prosom), oxazepam (Serax), temazepam (Restoril), triazolam (Halcion) (Last updated: 10/04/2017) ____________________________________________________________________________________________    BMI Assessment: Estimated body mass index is 35.73 kg/m as calculated from the following:   Height as of this encounter: 5' 8"  (1.727 m).   Weight as of this encounter: 235 lb (106.6 kg).  BMI interpretation table: BMI level Category Range association with higher incidence of chronic pain  <18 kg/m2 Underweight   18.5-24.9 kg/m2 Ideal body weight   25-29.9 kg/m2 Overweight Increased incidence by 20%  30-34.9 kg/m2 Obese (Class I) Increased incidence by 68%  35-39.9 kg/m2 Severe obesity (Class II) Increased incidence by 136%  >40 kg/m2 Extreme obesity (Class III) Increased incidence by 254%   Patient's current BMI Ideal Body weight  Body mass index is 35.73 kg/m. Ideal body weight: 68.4 kg (150 lb 12.7 oz) Adjusted ideal body weight: 83.7 kg (184 lb 7.6 oz)   BMI Readings from Last 4 Encounters:  08/15/18 35.73 kg/m  05/14/18 36.81 kg/m  03/19/18 36.14 kg/m  02/11/18 35.71 kg/m   Wt Readings from Last 4 Encounters:  08/15/18 235 lb (106.6 kg)  05/14/18 235 lb (106.6 kg)  03/19/18 230 lb 12 oz (104.7 kg)  02/11/18 228 lb (103.4 kg)

## 2018-08-15 NOTE — Progress Notes (Signed)
BP 136/70 (BP Location: Right Arm, Patient Position: Sitting, Cuff Size: Large)   Pulse 65   Temp 98.1 F (36.7 C) (Oral)   Ht 5\' 8"  (1.727 m) Comment: shoes  Wt 233 lb 4 oz (105.8 kg)   SpO2 97%   BMI 35.47 kg/m    CC: CPE Subjective:    Patient ID: Mark [name], male    DOB: 12/01/1955, 63 y.o.   MRN: [id number]  HPI: Mark HansonDennis K Beezley is a 63 y.o. male presenting on 08/16/2018 for Annual Exam (Pt 2. )   Sharyn CreamerSaw Lesia today for medicare wellness visit. Note reviewed.  Sinus headache today.  Has been taking iron tab daily.  Has had some triggering of R 2nd finger as well as possible dequervain's tendonitis R wrist.   Preventative: COLONOSCOPY 07/2007 prostate nodule, hemorrhoid, rpt 5 yrs [name]. Cologuard positive 08/2017 - he did not go.  Prostate cancer screening with nodule -followed by urology Dr Lonna CobbStoioff. Upcoming appt next month.  Lung cancer screening -not indicated Flu shotyearly Pneumovax 2007 Tdap 2011 shingrix - discussed Advanced directive discussion - has this at home - HCPOA would be [name] (lifelong friend).Asked to bring us a copy.  Seat belt use discussed Sunscreen use discussed, no changing moles on skin.  Non smoker  Alcohol - none Dentist q6 mo Eye exam intermittently  Divorced. Lives with wife. Edu: 8th grade Occ: retired, disability for chronic neck pain  Activity: no regular exercise - limited by back and knee pain Diet: increasing water, backing off sweetened beverages, poor vegetablesand fruits     Relevant past medical, surgical, family and social history reviewed and updated as indicated. Interim medical history since our last visit reviewed. Allergies and medications reviewed and updated. Outpatient Medications Prior to Visit  Medication Sig Dispense Refill  . aspirin 81 MG tablet Take 81 mg by mouth daily.      . cetirizine (ZYRTEC) 10 MG tablet Take 10 mg by mouth at bedtime.     . Cholecalciferol (VITAMIN D) 2000 units CAPS  Take 2,000 Units by mouth daily.     Marland Kitchen. CRANBERRY PO Take 4,200 mg by mouth at bedtime.     Marland Kitchen. desonide (DESOWEN) 0.05 % ointment Apply 1 application topically 2 (two) times daily as needed (for rash on chin due to dryness). 15 g 0  . diphenhydrAMINE (BENADRYL) 50 MG capsule Take 50 mg by mouth at bedtime.    . docusate sodium (COLACE) 100 MG capsule Take 100 mg by mouth 2 (two) times daily.    Marland Kitchen. FERROUS SULFATE PO Take 325 mg by mouth daily.    . Garlic 1000 MG CAPS Take 1,000 mg by mouth daily.     . Multiple Vitamin (MULTIVITAMIN PO) Take 1 tablet by mouth daily.      . Naloxone HCl (NARCAN) 4 MG/0.1ML LIQD Spray into one nostril. Repeat with second device into other nostril after 2-3 minutes if no or minimal response. 2 each 0  . naproxen (NAPROSYN) 500 MG tablet TAKE 1 BY MOUTH TWICE DAILY WITH A MEAL 180 tablet 1  . Omega-3 1000 MG CAPS Take 1,000 mg by mouth at bedtime.    [name]. [START ON 10/18/2018] oxyCODONE (OXY IR/ROXICODONE) 5 MG immediate release tablet Take 1 tablet (5 mg total) by mouth every 6 (six) hours as needed for severe pain. 120 tablet 0  . [START ON 09/18/2018] oxyCODONE (OXY IR/ROXICODONE) 5 MG immediate release tablet Take 1 tablet (5 mg total) by mouth every  6 (six) hours as needed for severe pain. 120 tablet 0  . [START ON 08/19/2018] oxyCODONE (OXY IR/ROXICODONE) 5 MG immediate release tablet Take 1 tablet (5 mg total) by mouth every 6 (six) hours as needed for severe pain. 120 tablet 0  . [START ON 10/17/2018] oxyCODONE (OXYCONTIN) 10 mg 12 hr tablet Take 1 tablet (10 mg total) by mouth every 12 (twelve) hours. 60 tablet 0  . [START ON 09/16/2018] oxyCODONE (OXYCONTIN) 10 mg 12 hr tablet Take 1 tablet (10 mg total) by mouth every 12 (twelve) hours. 60 tablet 0  . [START ON 08/17/2018] oxyCODONE (OXYCONTIN) 10 mg 12 hr tablet Take 1 tablet (10 mg total) by mouth every 12 (twelve) hours. 60 tablet 0  . potassium citrate (UROCIT-K) 10 MEQ (1080 MG) SR tablet Take 1 tablet (10 mEq  total) by mouth 2 (two) times daily. 180 tablet 3  . Saw Palmetto, Serenoa repens, (SAW PALMETTO PO) Take 450 mg by mouth 2 (two) times daily.     . sildenafil (REVATIO) 20 MG tablet Take 60-100 mg by mouth daily as needed (for erectile dysfunction.).    Marland Kitchen VITAMIN B COMPLEX-C CAPS Take by mouth daily.     . vitamin E 1000 UNIT capsule Take 1,000 Units by mouth daily.     Marland Kitchen atorvastatin (LIPITOR) 20 MG tablet Take 1 tablet (20 mg total) by mouth daily. (Patient taking differently: Take 20 mg by mouth at bedtime. ) 90 tablet 0  . FLUoxetine (PROZAC) 20 MG capsule Take 1 capsule (20 mg total) by mouth daily. 90 capsule 1  . FLUoxetine (PROZAC) 40 MG capsule Take 1 capsule (40 mg total) by mouth daily. 90 capsule 0  . fluticasone (FLONASE) 50 MCG/ACT nasal spray Place 2 sprays into both nostrils daily. 48 g 1  . furosemide (LASIX) 20 MG tablet TAKE 1 BY MOUTH DAILY 90 tablet 3  . omeprazole (PRILOSEC) 20 MG capsule TAKE 1 BY MOUTH DAILY 90 capsule 0  . rOPINIRole (REQUIP) 0.5 MG tablet Take 1 tablet (0.5 mg total) by mouth at bedtime. After 4 days may increase to 2 tablets at bedtime 90 tablet 0   No facility-administered medications prior to visit.      Per HPI unless specifically indicated in ROS section below Review of Systems  Constitutional: Negative for activity change, appetite change, chills, fatigue, fever and unexpected weight change.  HENT: Negative for hearing loss.   Eyes: Negative for visual disturbance.  Respiratory: Negative for cough, chest tightness, shortness of breath and wheezing.   Cardiovascular: Negative for chest pain, palpitations and leg swelling.       Has pacemaker - replaced 12/2017  Gastrointestinal: Positive for blood in stool (occasional attributed to hemorrhoids) and constipation (opiod related). Negative for abdominal distention, abdominal pain, diarrhea, nausea and vomiting.  Genitourinary: Negative for difficulty urinating and hematuria.  Musculoskeletal:  Negative for arthralgias, myalgias and neck pain.  Skin: Negative for rash.  Neurological: Negative for dizziness, seizures, syncope and headaches.  Hematological: Negative for adenopathy. Does not bruise/bleed easily.  Psychiatric/Behavioral: Negative for dysphoric mood. The patient is not nervous/anxious.    Objective:    BP 136/70 (BP Location: Right Arm, Patient Position: Sitting, Cuff Size: Large)   Pulse 65   Temp 98.1 F (36.7 C) (Oral)   Ht 5\' 8"  (1.727 m) Comment: shoes  Wt 233 lb 4 oz (105.8 kg)   SpO2 97%   BMI 35.47 kg/m   Wt Readings from Last 3 Encounters:  08/16/18 233  lb 4 oz (105.8 kg)  08/16/18 233 lb 4 oz (105.8 kg)  08/15/18 235 lb (106.6 kg)    Physical Exam Vitals signs and nursing note reviewed.  Constitutional:      General: He is not in acute distress.    Appearance: He is well-developed.  HENT:     Head: Normocephalic and atraumatic.     Right Ear: Hearing, tympanic membrane, ear canal and external ear normal.     Left Ear: Hearing, tympanic membrane, ear canal and external ear normal.     Nose: Nose normal.     Mouth/Throat:     Pharynx: Uvula midline. No oropharyngeal exudate or posterior oropharyngeal erythema.  Eyes:     General: No scleral icterus.    Conjunctiva/sclera: Conjunctivae normal.     Pupils: Pupils are equal, round, and reactive to light.  Neck:     Musculoskeletal: Normal range of motion and neck supple.  Cardiovascular:     Rate and Rhythm: Normal rate and regular rhythm.     Pulses:          Radial pulses are 2+ on the right side and 2+ on the left side.     Heart sounds: Normal heart sounds. No murmur.  Pulmonary:     Effort: Pulmonary effort is normal. No respiratory distress.     Breath sounds: Normal breath sounds. No wheezing or rales.  Abdominal:     General: Bowel sounds are normal. There is no distension.     Palpations: Abdomen is soft. There is no mass.     Tenderness: There is no abdominal tenderness. There is  no guarding or rebound.  Musculoskeletal: Normal range of motion.  Lymphadenopathy:     Cervical: No cervical adenopathy.  Skin:    General: Skin is warm and dry.     Findings: No rash.  Neurological:     Mental Status: He is alert and oriented to person, place, and time.     Comments: CN grossly intact, station and gait intact  Psychiatric:        Behavior: Behavior normal.        Thought Content: Thought content normal.        Judgment: Judgment normal.       Lab Results  Component Value Date   CHOL 116 08/09/2018   HDL 37.20 (L) 08/09/2018   LDLCALC 49 08/09/2018   LDLDIRECT 128.0 05/07/2015   TRIG 149.0 08/09/2018   CHOLHDL 3 08/09/2018    Results for orders placed or performed in visit on 08/16/18  POCT glycosylated hemoglobin (Hb A1C)  Result Value Ref Range   Hemoglobin A1C 5.5 4.0 - 5.6 %   HbA1c POC (<> result, manual entry)     HbA1c, POC (prediabetic range)     HbA1c, POC (controlled diabetic range)     Assessment & Plan:   Problem List Items Addressed This Visit    Severe obesity (BMI 35.0-39.9) with comorbidity (HCC)    Encouraged healthy diet choices for sustainable weight loss.       RLS (restless legs syndrome)    Doing well on requip and daily iron. Consider updated iron studies.       Obstructive sleep apnea    On BiPAP      Nephrolithiasis    Followed by urology.       MDD (major depressive disorder), recurrent episode, moderate (HCC)    Chronic, stable. Continue current regimen of prozac 60mg  daily.  Relevant Medications   FLUoxetine (PROZAC) 20 MG capsule   FLUoxetine (PROZAC) 40 MG capsule   Hyperlipidemia    Chronic, stable on lipitor - continue. The ASCVD Risk score Denman George(Goff DC Jr., et al., 2013) failed to calculate for the following reasons:   The valid total cholesterol range is 130 to 320 mg/dL       Relevant Medications   atorvastatin (LIPITOR) 20 MG tablet   furosemide (LASIX) 20 MG tablet   History of permanent  cardiac pacemaker placement   Health maintenance examination - Primary    Preventative protocols reviewed and updated unless pt declined. Discussed healthy diet and lifestyle.       Chronic pain syndrome (Chronic)    Followed by pain management.       Bradycardia    Pacemaker in place.      Benign prostatic hyperplasia with urinary obstruction    Followed by urology      Advanced care planning/counseling discussion    Advanced directive discussion - has this at home - HCPOA would be [name] (lifelong friend).Asked to bring us a copy.        Other Visit Diagnoses    Special screening for malignant neoplasms, colon       Relevant Orders   Ambulatory referral to Gastroenterology   Hyperglycemia       Relevant Orders   POCT glycosylated hemoglobin (Hb A1C) (Completed)       Meds ordered this encounter  Medications  . atorvastatin (LIPITOR) 20 MG tablet    Sig: Take 1 tablet (20 mg total) by mouth at bedtime.    Dispense:  90 tablet    Refill:  3  . FLUoxetine (PROZAC) 20 MG capsule    Sig: Take 1 capsule (20 mg total) by mouth daily.    Dispense:  90 capsule    Refill:  3  . FLUoxetine (PROZAC) 40 MG capsule    Sig: Take 1 capsule (40 mg total) by mouth daily.    Dispense:  90 capsule    Refill:  3  . furosemide (LASIX) 20 MG tablet    Sig: TAKE 1 BY MOUTH DAILY    Dispense:  90 tablet    Refill:  3  . fluticasone (FLONASE) 50 MCG/ACT nasal spray    Sig: Place 2 sprays into both nostrils daily.    Dispense:  48 g    Refill:  3  . rOPINIRole (REQUIP) 0.5 MG tablet    Sig: Take 1 tablet (0.5 mg total) by mouth at bedtime.    Dispense:  90 tablet    Refill:  3  . omeprazole (PRILOSEC) 20 MG capsule    Sig: TAKE 1 BY MOUTH DAILY    Dispense:  90 capsule    Refill:  3   Orders Placed This Encounter  Procedures  . Ambulatory referral to Gastroenterology    Referral Priority:   Routine    Referral Type:   Consultation    Referral Reason:   Specialty  Services Required    Number of Visits Requested:   1  . POCT glycosylated hemoglobin (Hb A1C)    Follow up plan: No follow-ups on file.  Eustaquio BoydenJavier Britt Petroni, MD

## 2018-08-15 NOTE — Patient Instructions (Addendum)
____________________________________________________________________________________________  Medication Rules  Purpose: To inform patients, and their family members, of our rules and regulations.  Applies to: All patients receiving prescriptions (written or electronic).  Pharmacy of record: Pharmacy where electronic prescriptions will be sent. If written prescriptions are taken to a different pharmacy, please inform the nursing staff. The pharmacy listed in the electronic medical record should be the one where you would like electronic prescriptions to be sent.  Electronic prescriptions: In compliance with the [place] Strengthen Opioid Misuse Prevention (STOP) Act of 2017 (Session Law 2017-74/H243), effective August 07, 2018, all controlled substances must be electronically prescribed. Calling prescriptions to the pharmacy will cease to exist.  Prescription refills: Only during scheduled appointments. Applies to all prescriptions.  NOTE: The following applies primarily to controlled substances (Opioid* Pain Medications).   Patient's responsibilities: 1. Pain Pills: Bring all pain pills to every appointment (except for procedure appointments). 2. Pill Bottles: Bring pills in original pharmacy bottle. Always bring the newest bottle. Bring bottle, even if empty. 3. Medication refills: You are responsible for knowing and keeping track of what medications you take and those you need refilled. The day before your appointment: write a list of all prescriptions that need to be refilled. The day of the appointment: give the list to the admitting nurse. Prescriptions will be written only during appointments. If you forget a medication: it will not be "Called in", "Faxed", or "electronically sent". You will need to get another appointment to get these prescribed. No early refills. Do not call asking to have your prescription filled early. 4. Prescription Accuracy: You are responsible for  carefully inspecting your prescriptions before leaving our office. Have the discharge nurse carefully go over each prescription with you, before taking them home. Make sure that your name is accurately spelled, that your address is correct. Check the name and dose of your medication to make sure it is accurate. Check the number of pills, and the written instructions to make sure they are clear and accurate. Make sure that you are given enough medication to last until your next medication refill appointment. 5. Taking Medication: Take medication as prescribed. When it comes to controlled substances, taking less pills or less frequently than prescribed is permitted and encouraged. Never take more pills than instructed. Never take medication more frequently than prescribed.  6. Inform other Doctors: Always inform, all of your healthcare providers, of all the medications you take. 7. Pain Medication from other Providers: You are not allowed to accept any additional pain medication from any other Doctor or Healthcare provider. There are two exceptions to this rule. (see below) In the event that you require additional pain medication, you are responsible for notifying us, as stated below. 8. Medication Agreement: You are responsible for carefully reading and following our Medication Agreement. This must be signed before receiving any prescriptions from our practice. Safely store a copy of your signed Agreement. Violations to the Agreement will result in no further prescriptions. (Additional copies of our Medication Agreement are available upon request.) 9. Laws, Rules, & Regulations: All patients are expected to follow all Federal and State Laws, Statutes, Rules, & Regulations. Ignorance of the Laws does not constitute a valid excuse. The use of any illegal substances is prohibited. 10. Adopted CDC guidelines & recommendations: Target dosing levels will be at or below 60 MME/day. Use of benzodiazepines** is not  recommended.  Exceptions: There are only two exceptions to the rule of not receiving pain medications from other Healthcare Providers. 1.   Exception #1 (Emergencies): In the event of an emergency (i.e.: accident requiring emergency care), you are allowed to receive additional pain medication. However, you are responsible for: As soon as you are able, call our office [phone], at any time of the day or night, and leave a message stating your name, the date and nature of the emergency, and the name and dose of the medication prescribed. In the event that your call is answered by a member of our staff, make sure to document and save the date, time, and the name of the person that took your information.  2. Exception #2 (Planned Surgery): In the event that you are scheduled by another doctor or dentist to have any type of surgery or procedure, you are allowed (for a period no longer than 30 days), to receive additional pain medication, for the acute post-op pain. However, in this case, you are responsible for picking up a copy of our "Post-op Pain Management for Surgeons" handout, and giving it to your surgeon or dentist. This document is available at our office, and does not require an appointment to obtain it. Simply go to our office during business hours (Monday-Thursday from 8:00 AM to 4:00 PM) (Friday 8:00 AM to 12:00 Noon) or if you have a scheduled appointment with [place], prior to your surgery, and ask for it by name. In addition, you will need to provide [place] with your name, name of your surgeon, type of surgery, and date of procedure or surgery.  *Opioid medications include: morphine, codeine, oxycodone, oxymorphone, hydrocodone, hydromorphone, meperidine, tramadol, tapentadol, buprenorphine, fentanyl, methadone. **Benzodiazepine medications include: diazepam (Valium), alprazolam (Xanax), clonazepam (Klonopine), lorazepam (Ativan), clorazepate (Tranxene), chlordiazepoxide (Librium), estazolam (Prosom),  oxazepam (Serax), temazepam (Restoril), triazolam (Halcion) (Last updated: 10/04/2017) ____________________________________________________________________________________________    BMI Assessment: Estimated body mass index is 35.73 kg/m as calculated from the following:   Height as of this encounter: 5\' 8"  (1.727 m).   Weight as of this encounter: 235 lb (106.6 kg).  BMI interpretation table: BMI level Category Range association with higher incidence of chronic pain  <18 kg/m2 Underweight   18.5-24.9 kg/m2 Ideal body weight   25-29.9 kg/m2 Overweight Increased incidence by 20%  30-34.9 kg/m2 Obese (Class I) Increased incidence by 68%  35-39.9 kg/m2 Severe obesity (Class II) Increased incidence by 136%  >40 kg/m2 Extreme obesity (Class III) Increased incidence by 254%   Patient's current BMI Ideal Body weight  Body mass index is 35.73 kg/m. Ideal body weight: 68.4 kg (150 lb 12.7 oz) Adjusted ideal body weight: 83.7 kg (184 lb 7.6 oz)   BMI Readings from Last 4 Encounters:  08/15/18 35.73 kg/m  05/14/18 36.81 kg/m  03/19/18 36.14 kg/m  02/11/18 35.71 kg/m   Wt Readings from Last 4 Encounters:  08/15/18 235 lb (106.6 kg)  05/14/18 235 lb (106.6 kg)  03/19/18 230 lb 12 oz (104.7 kg)  02/11/18 228 lb (103.4 kg)

## 2018-08-15 NOTE — Progress Notes (Signed)
Nursing Pain Medication Assessment:  Safety precautions to be maintained throughout the outpatient stay will include: orient to surroundings, keep bed in low position, maintain call bell within reach at all times, provide assistance with transfer out of bed and ambulation.  Medication Inspection Compliance: Pill count conducted under aseptic conditions, in front of the patient. Neither the pills nor the bottle was removed from the patient's sight at any time. Once count was completed pills were immediately returned to the patient in their original bottle.  Medication #1: Oxycodone ER (OxyContin) Pill/Patch Count: 24 of 60 pills remain Pill/Patch Appearance: Markings consistent with prescribed medication Bottle Appearance: Standard pharmacy container. Clearly labeled. Filled Date12 /12 / 2019 Last Medication intake:  Today  Medication #2: Oxycodone IR Pill/Patch Count: 31 of 120 pills remain Pill/Patch Appearance: Markings consistent with prescribed medication Bottle Appearance: Standard pharmacy container. Clearly labeled. Filled Date: 52 / 14 / 2019 Last Medication intake:  Today

## 2018-08-16 ENCOUNTER — Telehealth: Payer: Self-pay

## 2018-08-16 ENCOUNTER — Encounter: Payer: Self-pay | Admitting: Family Medicine

## 2018-08-16 ENCOUNTER — Ambulatory Visit (INDEPENDENT_AMBULATORY_CARE_PROVIDER_SITE_OTHER): Payer: Medicare Other

## 2018-08-16 ENCOUNTER — Ambulatory Visit (INDEPENDENT_AMBULATORY_CARE_PROVIDER_SITE_OTHER): Payer: Medicare Other | Admitting: Family Medicine

## 2018-08-16 VITALS — BP 136/70 | HR 65 | Temp 98.1°F | Ht 68.0 in | Wt 233.2 lb

## 2018-08-16 DIAGNOSIS — G2581 Restless legs syndrome: Secondary | ICD-10-CM

## 2018-08-16 DIAGNOSIS — N138 Other obstructive and reflux uropathy: Secondary | ICD-10-CM

## 2018-08-16 DIAGNOSIS — N200 Calculus of kidney: Secondary | ICD-10-CM

## 2018-08-16 DIAGNOSIS — R739 Hyperglycemia, unspecified: Secondary | ICD-10-CM | POA: Diagnosis not present

## 2018-08-16 DIAGNOSIS — E785 Hyperlipidemia, unspecified: Secondary | ICD-10-CM

## 2018-08-16 DIAGNOSIS — Z0000 Encounter for general adult medical examination without abnormal findings: Secondary | ICD-10-CM

## 2018-08-16 DIAGNOSIS — N401 Enlarged prostate with lower urinary tract symptoms: Secondary | ICD-10-CM

## 2018-08-16 DIAGNOSIS — Z1211 Encounter for screening for malignant neoplasm of colon: Secondary | ICD-10-CM

## 2018-08-16 DIAGNOSIS — F331 Major depressive disorder, recurrent, moderate: Secondary | ICD-10-CM

## 2018-08-16 DIAGNOSIS — R001 Bradycardia, unspecified: Secondary | ICD-10-CM

## 2018-08-16 DIAGNOSIS — Z7189 Other specified counseling: Secondary | ICD-10-CM

## 2018-08-16 DIAGNOSIS — Z950 Presence of cardiac pacemaker: Secondary | ICD-10-CM

## 2018-08-16 DIAGNOSIS — G894 Chronic pain syndrome: Secondary | ICD-10-CM

## 2018-08-16 DIAGNOSIS — M65321 Trigger finger, right index finger: Secondary | ICD-10-CM | POA: Insufficient documentation

## 2018-08-16 DIAGNOSIS — G4733 Obstructive sleep apnea (adult) (pediatric): Secondary | ICD-10-CM

## 2018-08-16 LAB — POCT GLYCOSYLATED HEMOGLOBIN (HGB A1C): Hemoglobin A1C: 5.5 % (ref 4.0–5.6)

## 2018-08-16 MED ORDER — FLUOXETINE HCL 40 MG PO CAPS: 40.0000 mg | ORAL_CAPSULE | Freq: Every day | ORAL | 3 refills | Status: DC

## 2018-08-16 MED ORDER — ATORVASTATIN CALCIUM 20 MG PO TABS: 20.0000 mg | ORAL_TABLET | Freq: Every day | ORAL | 3 refills | Status: DC

## 2018-08-16 MED ORDER — FLUTICASONE PROPIONATE 50 MCG/ACT NA SUSP: 2.0000 | Freq: Every day | NASAL | 3 refills | Status: DC

## 2018-08-16 MED ORDER — FUROSEMIDE 20 MG PO TABS: ORAL_TABLET | ORAL | 3 refills | Status: DC

## 2018-08-16 MED ORDER — OMEPRAZOLE 20 MG PO CPDR: DELAYED_RELEASE_CAPSULE | ORAL | 3 refills | Status: DC

## 2018-08-16 MED ORDER — ROPINIROLE HCL 0.5 MG PO TABS: 0.5000 mg | ORAL_TABLET | Freq: Every day | ORAL | 3 refills | Status: DC

## 2018-08-16 MED ORDER — FLUOXETINE HCL 20 MG PO CAPS: 20.0000 mg | ORAL_CAPSULE | Freq: Every day | ORAL | 3 refills | Status: DC

## 2018-08-16 NOTE — Assessment & Plan Note (Signed)
Doing well on requip and daily iron. Consider updated iron studies.

## 2018-08-16 NOTE — Assessment & Plan Note (Signed)
Followed by urology.   

## 2018-08-16 NOTE — Assessment & Plan Note (Signed)
Followed by pain management. 

## 2018-08-16 NOTE — Patient Instructions (Addendum)
Mark [name] , Thank you for taking time to come for your Medicare Wellness Visit. I appreciate your ongoing commitment to your health goals. Please review the following plan we discussed and let me know if I can assist you in the future.   These are the goals we discussed: Goals    . Follow up with Primary Care Provider     Starting 08/16/2018, I will continue to take medications as prescribed and to keep appointments with PCP as scheduled.        This is a list of the screening recommended for you and due dates:  Health Maintenance  Topic Date Due  . DTaP/Tdap/Td vaccine (2 - Td) 11/20/2019  . Tetanus Vaccine  11/20/2019  . Cologuard (Stool DNA test)  09/21/2020  . Flu Shot  Completed  .  Hepatitis C: One time screening is recommended by Center for Disease Control  (CDC) for  adults born from [birth year] through 1964.   Completed  . HIV Screening  Completed   Preventive Care for Adults  A healthy lifestyle and preventive care can promote health and wellness. Preventive health guidelines for adults include the following key practices.  . A routine yearly physical is a good way to check with your health care provider about your health and preventive screening. It is a chance to share any concerns and updates on your health and to receive a thorough exam.  . Visit your dentist for a routine exam and preventive care every 6 months. Brush your teeth twice a day and floss once a day. Good oral hygiene prevents tooth decay and gum disease.  . The frequency of eye exams is based on your age, health, family medical history, use  of contact lenses, and other factors. Follow your health care provider's recommendations for frequency of eye exams.  . Eat a healthy diet. Foods like vegetables, fruits, whole grains, low-fat dairy products, and lean protein foods contain the nutrients you need without too many calories. Decrease your intake of foods high in solid fats, added sugars, and salt. Eat the right  amount of calories for you. Get information about a proper diet from your health care provider, if necessary.  . Regular physical exercise is one of the most important things you can do for your health. Most adults should get at least 150 minutes of moderate-intensity exercise (any activity that increases your heart rate and causes you to sweat) each week. In addition, most adults need muscle-strengthening exercises on 2 or more days a week.  Silver Sneakers may be a benefit available to you. To determine eligibility, you may visit the website: www.silversneakers.com or contact program at (562)071-0572 Mon-Fri between 8AM-8PM.   . Maintain a healthy weight. The body mass index (BMI) is a screening tool to identify possible weight problems. It provides an estimate of body fat based on height and weight. Your health care provider can find your BMI and can help you achieve or maintain a healthy weight.   For adults 20 years and older: ? A BMI below 18.5 is considered underweight. ? A BMI of 18.5 to 24.9 is normal. ? A BMI of 25 to 29.9 is considered overweight. ? A BMI of 30 and above is considered obese.   . Maintain normal blood lipids and cholesterol levels by exercising and minimizing your intake of saturated fat. Eat a balanced diet with plenty of fruit and vegetables. Blood tests for lipids and cholesterol should begin at age 83 and be repeated every  5 years. If your lipid or cholesterol levels are high, you are over 50, or you are at high risk for heart disease, you may need your cholesterol levels checked more frequently. Ongoing high lipid and cholesterol levels should be treated with medicines if diet and exercise are not working.  . If you smoke, find out from your health care provider how to quit. If you do not use tobacco, please do not start.  . If you choose to drink alcohol, please do not consume more than 2 drinks per day. One drink is considered to be 12 ounces (355 mL) of beer, 5  ounces (148 mL) of wine, or 1.5 ounces (44 mL) of liquor.  . If you are 25-53 years old, ask your health care provider if you should take aspirin to prevent strokes.  . Use sunscreen. Apply sunscreen liberally and repeatedly throughout the day. You should seek shade when your shadow is shorter than you. Protect yourself by wearing long sleeves, pants, a wide-brimmed hat, and sunglasses year round, whenever you are outdoors.  . Once a month, do a whole body skin exam, using a mirror to look at the skin on your back. Tell your health care provider of new moles, moles that have irregular borders, moles that are larger than a pencil eraser, or moles that have changed in shape or color.

## 2018-08-16 NOTE — Patient Instructions (Addendum)
If interested, check with pharmacy about new 2 shot shingles series (shingrix).  We will refer you to GI (Dr Mechele Collin) A1c today.  Work on regular exercise routine.   Health Maintenance, Male A healthy lifestyle and preventive care is important for your health and wellness. Ask your health care provider about what schedule of regular examinations is right for you. What should I know about weight and diet? Eat a Healthy Diet  Eat plenty of vegetables, fruits, whole grains, low-fat dairy products, and lean protein.  Do not eat a lot of foods high in solid fats, added sugars, or salt.  Maintain a Healthy Weight Regular exercise can help you achieve or maintain a healthy weight. You should:  Do at least 150 minutes of exercise each week. The exercise should increase your heart rate and make you sweat (moderate-intensity exercise).  Do strength-training exercises at least twice a week. Watch Your Levels of Cholesterol and Blood Lipids  Have your blood tested for lipids and cholesterol every 5 years starting at 62 years of age. If you are at high risk for heart disease, you should start having your blood tested when you are 62 years old. You may need to have your cholesterol levels checked more often if: ? Your lipid or cholesterol levels are high. ? You are older than 62 years of age. ? You are at high risk for heart disease. What should I know about cancer screening? Many types of cancers can be detected early and may often be prevented. Lung Cancer  You should be screened every year for lung cancer if: ? You are a current smoker who has smoked for at least 30 years. ? You are a former smoker who has quit within the past 15 years.  Talk to your health care provider about your screening options, when you should start screening, and how often you should be screened. Colorectal Cancer  Routine colorectal cancer screening usually begins at 62 years of age and should be repeated every 5-10  years until you are 62 years old. You may need to be screened more often if early forms of precancerous polyps or small growths are found. Your health care provider may recommend screening at an earlier age if you have risk factors for colon cancer.  Your health care provider may recommend using home test kits to check for hidden blood in the stool.  A small camera at the end of a tube can be used to examine your colon (sigmoidoscopy or colonoscopy). This checks for the earliest forms of colorectal cancer. Prostate and Testicular Cancer  Depending on your age and overall health, your health care provider may do certain tests to screen for prostate and testicular cancer.  Talk to your health care provider about any symptoms or concerns you have about testicular or prostate cancer. Skin Cancer  Check your skin from head to toe regularly.  Tell your health care provider about any new moles or changes in moles, especially if: ? There is a change in a mole's size, shape, or color. ? You have a mole that is larger than a pencil eraser.  Always use sunscreen. Apply sunscreen liberally and repeat throughout the day.  Protect yourself by wearing long sleeves, pants, a wide-brimmed hat, and sunglasses when outside. What should I know about heart disease, diabetes, and high blood pressure?  If you are 92-81 years of age, have your blood pressure checked every 3-5 years. If you are 71 years of age or older, have  your blood pressure checked every year. You should have your blood pressure measured twice-once when you are at a hospital or clinic, and once when you are not at a hospital or clinic. Record the average of the two measurements. To check your blood pressure when you are not at a hospital or clinic, you can use: ? An automated blood pressure machine at a pharmacy. ? A home blood pressure monitor.  Talk to your health care provider about your target blood pressure.  If you are between 6-10  years old, ask your health care provider if you should take aspirin to prevent heart disease.  Have regular diabetes screenings by checking your fasting blood sugar level. ? If you are at a normal weight and have a low risk for diabetes, have this test once every three years after the age of 69. ? If you are overweight and have a high risk for diabetes, consider being tested at a younger age or more often.  A one-time screening for abdominal aortic aneurysm (AAA) by ultrasound is recommended for men aged 65-75 years who are current or former smokers. What should I know about preventing infection? Hepatitis B If you have a higher risk for hepatitis B, you should be screened for this virus. Talk with your health care provider to find out if you are at risk for hepatitis B infection. Hepatitis C Blood testing is recommended for:  Everyone born from [birth year] through 1964.  Anyone with known risk factors for hepatitis C. Sexually Transmitted Diseases (STDs)  You should be screened each year for STDs including gonorrhea and chlamydia if: ? You are sexually active and are younger than 62 years of age. ? You are older than 62 years of age and your health care provider tells you that you are at risk for this type of infection. ? Your sexual activity has changed since you were last screened and you are at an increased risk for chlamydia or gonorrhea. Ask your health care provider if you are at risk.  Talk with your health care provider about whether you are at high risk of being infected with HIV. Your health care provider may recommend a prescription medicine to help prevent HIV infection. What else can I do?  Schedule regular health, dental, and eye exams.  Stay current with your vaccines (immunizations).  Do not use any tobacco products, such as cigarettes, chewing tobacco, and e-cigarettes. If you need help quitting, ask your health care provider.  Limit alcohol intake to no more than 2 drinks  per day. One drink equals 12 ounces of beer, 5 ounces of wine, or 1 ounces of hard liquor.  Do not use street drugs.  Do not share needles.  Ask your health care provider for help if you need support or information about quitting drugs.  Tell your health care provider if you often feel depressed.  Tell your health care provider if you have ever been abused or do not feel safe at home. This information is not intended to replace advice given to you by your health care provider. Make sure you discuss any questions you have with your health care provider. Document Released: 01/20/2008 Document Revised: 03/22/2016 Document Reviewed: 04/27/2015 Elsevier Interactive Patient Education  2019 [employer].

## 2018-08-16 NOTE — Telephone Encounter (Signed)
Left message on vm for pt to call back.  Need to know what pharmacy to send refills:  [facility] or [facility].

## 2018-08-16 NOTE — Assessment & Plan Note (Signed)
Pacemaker in place

## 2018-08-16 NOTE — Progress Notes (Signed)
PCP notes:   Health maintenance:  No gaps identified.  Abnormal screenings:   Hearing - failed  Hearing Screening   125Hz  250Hz  500Hz  1000Hz  2000Hz  3000Hz  4000Hz  6000Hz  8000Hz   Right ear:   40 40 40  40    Left ear:   0 40 40  40    Vision Screening Comments: Vision exam in Jan 2019 with Dr. Mayford Knife  Mini-Cog score: 19/20 MMSE - Mini Mental State Exam 08/16/2018 08/13/2017 08/04/2016  Orientation to time 5 5 5   Orientation to Place 5 5 5   Registration 3 3 3   Attention/ Calculation 0 0 0  Recall 2 3 1   Recall-comments unable to recall 1 of 3 words - pt was unable to recall 2 of 3 words  Language- name 2 objects 0 0 0  Language- repeat 1 1 1   Language- follow 3 step command 3 3 3   Language- read & follow direction 0 0 0  Write a sentence 0 0 0  Copy design 0 0 0  Total score 19 20 18      Patient concerns:   None  Nurse concerns:  None  Next PCP appt:   08/16/18 @ 1500

## 2018-08-16 NOTE — Assessment & Plan Note (Signed)
Chronic, stable. Continue current regimen of prozac 60mg  daily.

## 2018-08-16 NOTE — Progress Notes (Signed)
Subjective:   Mark HansonDennis K [name] is a 63 y.o. male who presents for Medicare Annual/Subsequent preventive examination.  Review of Systems:  N/A Cardiac Risk Factors include: advanced age (>3555men, 28>65 women);obesity (BMI >30kg/m2);dyslipidemia;male gender     Objective:    Vitals: BP 136/70 (BP Location: Right Arm, Patient Position: Sitting, Cuff Size: Large)   Pulse 65   Temp 98.1 F (36.7 C) (Oral)   Ht 5\' 8"  (1.727 m) Comment: shoes  Wt 233 lb 4 oz (105.8 kg)   SpO2 97%   BMI 35.47 kg/m   Body mass index is 35.47 kg/m.  Advanced Directives 08/16/2018 12/13/2017 12/06/2017 08/13/2017 05/09/2017 02/06/2017 10/26/2016  Does Patient Have a Medical Advance Directive? No No No No No No No  Type of Advance Directive - - - - - - -  Does patient want to make changes to medical advance directive? - - - - - - -  Copy of Healthcare Power of Attorney in Chart? - - - - - - -  Would patient like information on creating a medical advance directive? No - Patient declined No - Patient declined No - Patient declined No - Patient declined - - No - Patient declined    Tobacco Social History   Tobacco Use  Smoking Status Never Smoker  Smokeless Tobacco Never Used     Counseling given: No   Clinical Intake:  Pre-visit preparation completed: Yes  Pain : 0-10 Pain Score: 4  Pain Type: Acute pain, Chronic pain Pain Location: Neck(sinus)     Nutritional Status: BMI > 30  Obese Nutritional Risks: None Diabetes: No  How often do you need to have someone help you when you read instructions, pamphlets, or other written materials from your doctor or pharmacy?: 1 - Never What is the last grade level you completed in school?: 8th grade  Interpreter Needed?: No  Comments: pt lives alone Information entered by :: LPinson, LPN  Past Medical History:  Diagnosis Date  . Anxiety   . Arthritis   . Chronic pain   . Depression   . Heart murmur   . History of kidney stones April 12,2017   removed  two kidney stones about 1 cm in size  . History of permanent cardiac pacemaker placement 05/31/2015  . Hydrocele in adult 05/07/2015  . Kidney stones   . Migraine   . MRSA carrier 2019  . Presence of permanent cardiac pacemaker   . Prostatitis   . Sleep apnea   . Sleep apnea    getting new cpap machine   Past Surgical History:  Procedure Laterality Date  . CARDIOVASCULAR STRESS TEST  2014   Dr Lady GaryFath  . CARPAL TUNNEL RELEASE Bilateral 2006  . CERVICAL SPINE SURGERY  2008   C5/6  . COLONOSCOPY  07/2007   prostate nodule, hemorrhoid, rpt 5 yrs [name]  . IMPLANTABLE CARDIOVERTER DEFIBRILLATOR (ICD) GENERATOR CHANGE Left 12/13/2017   Procedure: PACER CHANGE OUT;  Surgeon: Marcina MillardParaschos, Alexander, MD;  Location: [facility] ORS;  Service: Cardiovascular;  Laterality: Left;  . LITHOTRIPSY Bilateral 2012   Stoioff  . LITHOTRIPSY Left 11/2015   x2 Stoioff  . pace maker revision  12/13/2017  . PACEMAKER PLACEMENT  2009   s/p Medtronic Adapta DR Pacemaker (Dr. Lady GaryFath)  . ROTATOR CUFF REPAIR Left 2009  . TONSILLECTOMY  1964  . ULNAR NERVE REPAIR  2008   Left   Family History  Problem Relation Age of Onset  . Heart disease Mother   . Diabetes  Mother   . Arthritis Mother   . Cancer Mother        Breast Cancer  . Heart block Father   . Heart disease Father   . Arthritis Father   . Cancer Father        skin cancer   Social History   Socioeconomic History  . Marital status: Divorced    Spouse name: Not on file  . Number of children: Not on file  . Years of education: Not on file  . Highest education level: Not on file  Occupational History  . Not on file  Social Needs  . Financial resource strain: Not on file  . Food insecurity:    Worry: Not on file    Inability: Not on file  . Transportation needs:    Medical: Not on file    Non-medical: Not on file  Tobacco Use  . Smoking status: Never Smoker  . Smokeless tobacco: Never Used  Substance and Sexual Activity  . Alcohol use: No   . Drug use: No  . Sexual activity: Never  Lifestyle  . Physical activity:    Days per week: Not on file    Minutes per session: Not on file  . Stress: Not on file  Relationships  . Social connections:    Talks on phone: Not on file    Gets together: Not on file    Attends religious service: Not on file    Active member of club or organization: Not on file    Attends meetings of clubs or organizations: Not on file    Relationship status: Not on file  Other Topics Concern  . Not on file  Social History Narrative   Divorced. Lives with wife.   Edu: 8th grade   Occ: retired, disability for chronic neck pain    Activity: no regular exercise - limited by back and knee pain   Diet: poor water, lots of tang and gatorade, no vegetables or fruits    Outpatient Encounter Medications as of 08/16/2018  Medication Sig  . aspirin 81 MG tablet Take 81 mg by mouth daily.    Marland Kitchen atorvastatin (LIPITOR) 20 MG tablet Take 1 tablet (20 mg total) by mouth daily. (Patient taking differently: Take 20 mg by mouth at bedtime. )  . cetirizine (ZYRTEC) 10 MG tablet Take 10 mg by mouth at bedtime.   . Cholecalciferol (VITAMIN D) 2000 units CAPS Take 2,000 Units by mouth daily.   Marland Kitchen CRANBERRY PO Take 4,200 mg by mouth at bedtime.   Marland Kitchen desonide (DESOWEN) 0.05 % ointment Apply 1 application topically 2 (two) times daily as needed (for rash on chin due to dryness).  . diphenhydrAMINE (BENADRYL) 50 MG capsule Take 50 mg by mouth at bedtime.  . docusate sodium (COLACE) 100 MG capsule Take 100 mg by mouth 2 (two) times daily.  Marland Kitchen FERROUS SULFATE PO Take 325 mg by mouth daily.  Marland Kitchen FLUoxetine (PROZAC) 20 MG capsule Take 1 capsule (20 mg total) by mouth daily.  Marland Kitchen FLUoxetine (PROZAC) 40 MG capsule Take 1 capsule (40 mg total) by mouth daily.  . fluticasone (FLONASE) 50 MCG/ACT nasal spray Place 2 sprays into both nostrils daily.  . furosemide (LASIX) 20 MG tablet TAKE 1 BY MOUTH DAILY  . Garlic 1000 MG CAPS Take 1,000 mg  by mouth daily.   . Multiple Vitamin (MULTIVITAMIN PO) Take 1 tablet by mouth daily.    . Naloxone HCl (NARCAN) 4 MG/0.1ML LIQD Spray into one  nostril. Repeat with second device into other nostril after 2-3 minutes if no or minimal response.  . naproxen (NAPROSYN) 500 MG tablet TAKE 1 BY MOUTH TWICE DAILY WITH A MEAL  . Omega-3 1000 MG CAPS Take 1,000 mg by mouth at bedtime.  Marland Kitchen omeprazole (PRILOSEC) 20 MG capsule TAKE 1 BY MOUTH DAILY  . [START ON 10/18/2018] oxyCODONE (OXY IR/ROXICODONE) 5 MG immediate release tablet Take 1 tablet (5 mg total) by mouth every 6 (six) hours as needed for severe pain.  [name] ON 09/18/2018] oxyCODONE (OXY IR/ROXICODONE) 5 MG immediate release tablet Take 1 tablet (5 mg total) by mouth every 6 (six) hours as needed for severe pain.  [name] ON 08/19/2018] oxyCODONE (OXY IR/ROXICODONE) 5 MG immediate release tablet Take 1 tablet (5 mg total) by mouth every 6 (six) hours as needed for severe pain.  [name] ON 10/17/2018] oxyCODONE (OXYCONTIN) 10 mg 12 hr tablet Take 1 tablet (10 mg total) by mouth every 12 (twelve) hours.  [name] ON 09/16/2018] oxyCODONE (OXYCONTIN) 10 mg 12 hr tablet Take 1 tablet (10 mg total) by mouth every 12 (twelve) hours.  [name] ON 08/17/2018] oxyCODONE (OXYCONTIN) 10 mg 12 hr tablet Take 1 tablet (10 mg total) by mouth every 12 (twelve) hours.  . potassium citrate (UROCIT-K) 10 MEQ (1080 MG) SR tablet Take 1 tablet (10 mEq total) by mouth 2 (two) times daily.  Marland Kitchen rOPINIRole (REQUIP) 0.5 MG tablet Take 1 tablet (0.5 mg total) by mouth at bedtime. After 4 days may increase to 2 tablets at bedtime  . Saw Palmetto, Serenoa repens, (SAW PALMETTO PO) Take 450 mg by mouth 2 (two) times daily.   . sildenafil (REVATIO) 20 MG tablet Take 60-100 mg by mouth daily as needed (for erectile dysfunction.).  Marland Kitchen VITAMIN B COMPLEX-C CAPS Take by mouth daily.   . vitamin E 1000 UNIT capsule Take 1,000 Units by mouth daily.    No facility-administered encounter  medications on file as of 08/16/2018.     Activities of Daily Living In your present state of health, do you have any difficulty performing the following activities: 08/16/2018 12/06/2017  Hearing? N N  Vision? N N  Difficulty concentrating or making decisions? N N  Walking or climbing stairs? Y N  Comment minimal SOB when climbing stairs -  Dressing or bathing? N N  Doing errands, shopping? N N  Preparing Food and eating ? N -  Using the Toilet? N -  In the past six months, have you accidently leaked urine? N -  Do you have problems with loss of bowel control? N -  Managing your Medications? N -  Managing your Finances? N -  Housekeeping or managing your Housekeeping? N -  Some recent data might be hidden    Patient Care Team: [name], MD as PCP - General (Family Medicine) Jerelyn Scott, DDS, PA as Referring Physician (Dentistry) [name], MD as Referring Physician (Pain Medicine) Dalia Heading, MD as Consulting Physician (Cardiology) Shane Crutch, MD as Consulting Physician (Pulmonary Disease) [name], MD (Urology) Dewitt Rota, OD as Referring Physician (Optometry)   Assessment:   This is a routine wellness examination for [place].   Hearing Screening   125Hz  250Hz  500Hz  1000Hz  2000Hz  3000Hz  4000Hz  6000Hz  8000Hz   Right ear:   40 40 40  40    Left ear:   0 40 40  40    Vision Screening Comments: Vision exam in Jan 2019 with Dr.  Turner   Exercise Activities and Dietary recommendations Current Exercise Habits: Home exercise routine, Type of exercise: walking, Time (Minutes): > 60, Frequency (Times/Week): 7, Weekly Exercise (Minutes/Week): 0, Intensity: Mild, Exercise limited by: None identified  Goals    . Follow up with Primary Care Provider     Starting 08/16/2018, I will continue to take medications as prescribed and to keep appointments with PCP as scheduled.        Fall Risk Fall Risk  08/16/2018 08/15/2018 05/14/2018 02/11/2018  11/12/2017  Falls in the past year? 0 0 No No No   Depression Screen PHQ 2/9 Scores 08/16/2018 05/14/2018 02/11/2018 02/11/2018  PHQ - 2 Score 0 1 1 0  PHQ- 9 Score 0 - 6 -  Exception Documentation - - - Patient refusal    Cognitive Function MMSE - Mini Mental State Exam 08/16/2018 08/13/2017 08/04/2016  Orientation to time 5 5 5   Orientation to Place 5 5 5   Registration 3 3 3   Attention/ Calculation 0 0 0  Recall 2 3 1   Recall-comments unable to recall 1 of 3 words - pt was unable to recall 2 of 3 words  Language- name 2 objects 0 0 0  Language- repeat 1 1 1   Language- follow 3 step command 3 3 3   Language- read & follow direction 0 0 0  Write a sentence 0 0 0  Copy design 0 0 0  Total score 19 20 18      PLEASE NOTE: A Mini-Cog screen was completed. Maximum score is 20. A value of 0 denotes this part of Folstein MMSE was not completed or the patient failed this part of the Mini-Cog screening.   Mini-Cog Screening Orientation to Time - Max 5 pts Orientation to Place - Max 5 pts Registration - Max 3 pts Recall - Max 3 pts Language Repeat - Max 1 pts Language Follow 3 Step Command - Max 3 pts     Immunization History  Administered Date(s) Administered  . Influenza,inj,Quad PF,6+ Mos 04/22/2013, 04/28/2014, 05/07/2015, 04/11/2016, 05/22/2017, 05/16/2018  . Pneumococcal Polysaccharide-23 05/30/2006  . Tdap 11/19/2009    Screening Tests Health Maintenance  Topic Date Due  . DTaP/Tdap/Td (2 - Td) 11/20/2019  . TETANUS/TDAP  11/20/2019  . Fecal DNA (Cologuard)  09/21/2020  . INFLUENZA VACCINE  Completed  . Hepatitis C Screening  Completed  . HIV Screening  Completed     Plan:     I have personally reviewed, addressed, and noted the following in the patient's chart:  A. Medical and social history B. Use of alcohol, tobacco or illicit drugs  C. Current medications and supplements D. Functional ability and status E.  Nutritional status F.  Physical activity G. Advance  directives H. List of other physicians I.  Hospitalizations, surgeries, and ER visits in previous 12 months J.  Vitals K. Screenings to include hearing, vision, cognitive, depression L. Referrals and appointments - none  In addition, I have reviewed and discussed with patient certain preventive protocols, quality metrics, and best practice recommendations. A written personalized care plan for preventive services as well as general preventive health recommendations were provided to patient.  See attached scanned questionnaire for additional information.   Signed,   Randa Evens, MHA, BS, LPN Health Coach

## 2018-08-16 NOTE — Assessment & Plan Note (Signed)
Advanced directive discussion -has this at home -HCPOA would be Susan Ray (lifelong friend).Asked to bring us a copy. 

## 2018-08-16 NOTE — Assessment & Plan Note (Signed)
Preventative protocols reviewed and updated unless pt declined. Discussed healthy diet and lifestyle.  

## 2018-08-16 NOTE — Assessment & Plan Note (Addendum)
Encouraged healthy diet choices for sustainable weight loss.  

## 2018-08-16 NOTE — Assessment & Plan Note (Signed)
On BiPAP.

## 2018-08-16 NOTE — Assessment & Plan Note (Signed)
Chronic, stable on lipitor - continue. The ASCVD Risk score (Goff DC Jr., et al., 2013) failed to calculate for the following reasons:   The valid total cholesterol range is 130 to 320 mg/dL  

## 2018-08-21 LAB — TOXASSURE SELECT 13 (MW), URINE

## 2018-08-29 ENCOUNTER — Ambulatory Visit: Payer: Medicare Other | Admitting: Urology

## 2018-09-10 ENCOUNTER — Ambulatory Visit: Payer: Medicare Other | Admitting: Urology

## 2018-09-10 ENCOUNTER — Encounter: Payer: Self-pay | Admitting: Urology

## 2018-09-10 ENCOUNTER — Other Ambulatory Visit: Payer: Self-pay

## 2018-09-10 VITALS — BP 169/96 | HR 80 | Ht 68.0 in | Wt 235.1 lb

## 2018-09-10 DIAGNOSIS — N200 Calculus of kidney: Secondary | ICD-10-CM | POA: Diagnosis not present

## 2018-09-10 DIAGNOSIS — N5201 Erectile dysfunction due to arterial insufficiency: Secondary | ICD-10-CM | POA: Diagnosis not present

## 2018-09-10 MED ORDER — SILDENAFIL CITRATE 20 MG PO TABS: 60.0000 mg | ORAL_TABLET | Freq: Every day | ORAL | 3 refills | Status: DC | PRN

## 2018-09-11 NOTE — Progress Notes (Signed)
09/10/2018 8:22 AM   Mark [name] Feb 06, 1956 [id number]  Referring provider: Ria Bush, MD [address] [facility] [place], [place] [address]  Chief Complaint  Patient presents with  . Nephrolithiasis    recheck   Urologic history: 1.  Nephrolithiasis  -Bilateral, nonobstructing renal calculi  -Left ureteroscopic stone removal 11/2015  -Hypocitraturia/hyperoxaluria on 24 urine study  -Abnormalities were normal on follow-up 24-hour urine  2.  Erectile dysfunction  -On generic sildenafil   HPI: 63 year old male presents for annual follow-up.  Since his visit last year he remains asymptomatic.  Denies flank, abdominal, pelvic or scrotal pain.  He denies bothersome lower urinary tract symptoms or gross hematuria.  He remains on potassium citrate.  PSA January 2020 with his PCP was stable at 0.74   PMH: Past Medical History:  Diagnosis Date  . Anxiety   . Arthritis   . Chronic pain   . Depression   . Heart murmur   . History of kidney stones April 12,2017   removed two kidney stones about 1 cm in size  . History of permanent cardiac pacemaker placement 05/31/2015  . Hydrocele in adult 05/07/2015  . Kidney stones   . Migraine   . MRSA carrier 2019  . Presence of permanent cardiac pacemaker   . Prostatitis   . Sleep apnea   . Sleep apnea    getting new cpap machine    Surgical History: Past Surgical History:  Procedure Laterality Date  . CARDIOVASCULAR STRESS TEST  2014   Dr Ubaldo Glassing  . CARPAL TUNNEL RELEASE Bilateral 2006  . CERVICAL SPINE SURGERY  2008   C5/6  . COLONOSCOPY  07/2007   prostate nodule, hemorrhoid, rpt 5 yrs [name])  . IMPLANTABLE CARDIOVERTER DEFIBRILLATOR (ICD) GENERATOR CHANGE Left 12/13/2017   Procedure: PACER CHANGE OUT;  Surgeon: Isaias Cowman, MD;  Location: [facility] ORS;  Service: Cardiovascular;  Laterality: Left;  . LITHOTRIPSY Bilateral 2012   Ewel Lona  . LITHOTRIPSY Left 11/2015   x2 Westly Hinnant  . pace maker revision   12/13/2017  . PACEMAKER PLACEMENT  2009   s/p Medtronic Adapta DR Pacemaker (Dr. Ubaldo Glassing)  . ROTATOR CUFF REPAIR Left 2009  . TONSILLECTOMY  1964  . ULNAR NERVE REPAIR  2008   Left    Home Medications:  Allergies as of 09/10/2018      Reactions   Ciprofloxacin Other (See Comments)   PANIC ATTACKS (Fluoroquinolones)   Metformin Other (See Comments)   Bad acid reflux   Buprenorphine Hcl Nausea Only   Morphine And Related Nausea Only   Penicillins Rash   Rash Has patient had a PCN reaction causing immediate rash, facial/tongue/throat swelling, SOB or lightheadedness with hypotension: Unknown Has patient had a PCN reaction causing severe rash involving mucus membranes or skin necrosis: Unknown Has patient had a PCN reaction that required hospitalization: No Has patient had a PCN reaction occurring within the last 10 years: No CHILDHOOD REACTION. If all of the above answers are "NO", then may proceed with Cephalosporin use.      Medication List       Accurate as of September 10, 2018 11:59 PM. Always use your most recent med list.        aspirin 81 MG tablet Take 81 mg by mouth daily.   atorvastatin 20 MG tablet Commonly known as:  LIPITOR Take 1 tablet (20 mg total) by mouth at bedtime.   cetirizine 10 MG tablet Commonly known as:  ZYRTEC Take 10 mg by mouth  at bedtime.   clindamycin 150 MG capsule Commonly known as:  CLEOCIN Take by mouth 3 (three) times daily.   CRANBERRY PO Take 4,200 mg by mouth at bedtime.   desonide 0.05 % ointment Commonly known as:  DESOWEN Apply 1 application topically 2 (two) times daily as needed (for rash on chin due to dryness).   diphenhydrAMINE 50 MG capsule Commonly known as:  BENADRYL Take 50 mg by mouth at bedtime.   docusate sodium 100 MG capsule Commonly known as:  COLACE Take 100 mg by mouth 2 (two) times daily.   FERROUS SULFATE PO Take 325 mg by mouth daily.   FLUoxetine 20 MG capsule Commonly known as:  PROZAC Take 1  capsule (20 mg total) by mouth daily.   FLUoxetine 40 MG capsule Commonly known as:  PROZAC Take 1 capsule (40 mg total) by mouth daily.   fluticasone 50 MCG/ACT nasal spray Commonly known as:  FLONASE Place 2 sprays into both nostrils daily.   furosemide 20 MG tablet Commonly known as:  LASIX TAKE 1 BY MOUTH DAILY   Garlic 7356 MG Caps Take 1,000 mg by mouth daily.   MULTIVITAMIN PO Take 1 tablet by mouth daily.   naloxone 4 MG/0.1ML Liqd nasal spray kit Commonly known as:  NARCAN Spray into one nostril. Repeat with second device into other nostril after 2-3 minutes if no or minimal response.   naproxen 500 MG tablet Commonly known as:  NAPROSYN TAKE 1 BY MOUTH TWICE DAILY WITH A MEAL   Omega-3 1000 MG Caps Take 1,000 mg by mouth at bedtime.   omeprazole 20 MG capsule Commonly known as:  PRILOSEC TAKE 1 BY MOUTH DAILY   oxyCODONE 10 mg 12 hr tablet Commonly known as:  OXYCONTIN Take 1 tablet (10 mg total) by mouth every 12 (twelve) hours.   oxyCODONE 5 MG immediate release tablet Commonly known as:  Oxy IR/ROXICODONE Take 1 tablet (5 mg total) by mouth every 6 (six) hours as needed for severe pain.   oxyCODONE 10 mg 12 hr tablet Commonly known as:  OXYCONTIN Take 1 tablet (10 mg total) by mouth every 12 (twelve) hours. Start taking on:  September 16, 2018   oxyCODONE 5 MG immediate release tablet Commonly known as:  Oxy IR/ROXICODONE Take 1 tablet (5 mg total) by mouth every 6 (six) hours as needed for severe pain. Start taking on:  September 18, 2018   oxyCODONE 10 mg 12 hr tablet Commonly known as:  OXYCONTIN Take 1 tablet (10 mg total) by mouth every 12 (twelve) hours. Start taking on:  October 17, 2018   oxyCODONE 5 MG immediate release tablet Commonly known as:  Oxy IR/ROXICODONE Take 1 tablet (5 mg total) by mouth every 6 (six) hours as needed for severe pain. Start taking on:  October 18, 2018   potassium citrate 10 MEQ (1080 MG) SR tablet Commonly  known as:  UROCIT-K Take 1 tablet (10 mEq total) by mouth 2 (two) times daily.   rOPINIRole 0.5 MG tablet Commonly known as:  REQUIP Take 1 tablet (0.5 mg total) by mouth at bedtime.   SAW PALMETTO PO Take 450 mg by mouth 2 (two) times daily.   sildenafil 20 MG tablet Commonly known as:  REVATIO Take 3-5 tablets (60-100 mg total) by mouth daily as needed (for erectile dysfunction.).   VITAMIN B COMPLEX-C Caps Take by mouth daily.   Vitamin D 50 MCG (2000 UT) Caps Take 2,000 Units by mouth daily.  vitamin E 1000 UNIT capsule Take 1,000 Units by mouth daily.       Allergies:  Allergies  Allergen Reactions  . Ciprofloxacin Other (See Comments)    PANIC ATTACKS (Fluoroquinolones)   . Metformin Other (See Comments)    Bad acid reflux  . Buprenorphine Hcl Nausea Only  . Morphine And Related Nausea Only  . Penicillins Rash    Rash Has patient had a PCN reaction causing immediate rash, facial/tongue/throat swelling, SOB or lightheadedness with hypotension: Unknown Has patient had a PCN reaction causing severe rash involving mucus membranes or skin necrosis: Unknown Has patient had a PCN reaction that required hospitalization: No Has patient had a PCN reaction occurring within the last 10 years: No CHILDHOOD REACTION. If all of the above answers are "NO", then may proceed with Cephalosporin use.     Family History: Family History  Problem Relation Age of Onset  . Heart disease Mother   . Diabetes Mother   . Arthritis Mother   . Cancer Mother        Breast Cancer  . Heart block Father   . Heart disease Father   . Arthritis Father   . Cancer Father        skin cancer    Social History:  reports that he has never smoked. He has never used smokeless tobacco. He reports that he does not drink alcohol or use drugs.  ROS: UROLOGY Frequent Urination?: No Hard to postpone urination?: No Burning/pain with urination?: No Get up at night to urinate?: No Leakage of  urine?: No Urine stream starts and stops?: No Trouble starting stream?: No Do you have to strain to urinate?: No Blood in urine?: No Urinary tract infection?: No Sexually transmitted disease?: No Injury to kidneys or bladder?: No Painful intercourse?: No Weak stream?: No Erection problems?: No Penile pain?: No  Gastrointestinal Nausea?: No Vomiting?: No Indigestion/heartburn?: No Diarrhea?: No Constipation?: No  Constitutional Fever: No Night sweats?: No Weight loss?: No Fatigue?: No  Skin Skin rash/lesions?: No Itching?: No  Eyes Blurred vision?: No Double vision?: No  Ears/Nose/Throat Sore throat?: No Sinus problems?: No  Hematologic/Lymphatic Swollen glands?: No Easy bruising?: No  Cardiovascular Leg swelling?: Yes Chest pain?: No  Respiratory Cough?: No Shortness of breath?: Yes  Endocrine Excessive thirst?: No  Musculoskeletal Back pain?: Yes Joint pain?: Yes  Neurological Headaches?: No Dizziness?: No  Psychologic Depression?: Yes Anxiety?: Yes  Physical Exam: BP (!) 169/96   Pulse 80   Ht _0  (1.727 m)   Wt 235 lb 1.6 oz (106.6 kg)   SpO2 96%   BMI 35.75 kg/m   Constitutional:  Alert and oriented, No acute distress. HEENT: [place] AT, moist mucus membranes.  Trachea midline, no masses. Cardiovascular: No clubbing, cyanosis, or edema. Respiratory: Normal respiratory effort, no increased work of breathing. GI: Abdomen is soft, nontender, nondistended, no abdominal masses GU: No CVA tenderness Lymph: No cervical or inguinal lymphadenopathy. Skin: No rashes, bruises or suspicious lesions. Neurologic: Grossly intact, no focal deficits, moving all 4 extremities. Psychiatric: Normal mood and affect.  Laboratory Data:  Lab Results  Component Value Date   PSA 0.74 08/09/2018   PSA 0.82 08/08/2017   PSA 1.10 08/04/2016    Assessment & Plan:   62 year old male with nephrolithiasis and ED.  Potassium citrate and sildenafil were  refilled.  KUB was ordered and he will be notified with results.  Return in about 1 year (around 09/11/2019) for Recheck.   Abbie Sons, MD  [facility] [address] [place], [place] [address] [phone]

## 2018-09-12 ENCOUNTER — Encounter: Payer: Self-pay | Admitting: Urology

## 2018-09-12 DIAGNOSIS — N5201 Erectile dysfunction due to arterial insufficiency: Secondary | ICD-10-CM | POA: Insufficient documentation

## 2018-09-12 MED ORDER — POTASSIUM CITRATE ER 10 MEQ (1080 MG) PO TBCR: 10.0000 meq | EXTENDED_RELEASE_TABLET | Freq: Two times a day (BID) | ORAL | 3 refills | Status: DC

## 2018-09-17 DIAGNOSIS — I495 Sick sinus syndrome: Secondary | ICD-10-CM | POA: Diagnosis not present

## 2018-09-24 ENCOUNTER — Ambulatory Visit
Admission: RE | Admit: 2018-09-24 | Discharge: 2018-09-24 | Disposition: A | Discharge status: Home / Self Care | Payer: Medicare Other | Attending: Urology | Admitting: Urology

## 2018-09-24 ENCOUNTER — Ambulatory Visit
Admission: RE | Admit: 2018-09-24 | Discharge: 2018-09-24 | Disposition: A | Discharge status: Home / Self Care | Payer: Medicare Other | Source: Ambulatory Visit | Attending: Urology | Admitting: Urology

## 2018-09-24 DIAGNOSIS — Z87898 Personal history of other specified conditions: Secondary | ICD-10-CM | POA: Diagnosis not present

## 2018-09-24 DIAGNOSIS — K219 Gastro-esophageal reflux disease without esophagitis: Secondary | ICD-10-CM | POA: Insufficient documentation

## 2018-09-24 DIAGNOSIS — R195 Other fecal abnormalities: Secondary | ICD-10-CM | POA: Diagnosis not present

## 2018-09-24 DIAGNOSIS — D509 Iron deficiency anemia, unspecified: Secondary | ICD-10-CM | POA: Diagnosis not present

## 2018-09-24 DIAGNOSIS — N200 Calculus of kidney: Secondary | ICD-10-CM | POA: Insufficient documentation

## 2018-09-24 DIAGNOSIS — Z950 Presence of cardiac pacemaker: Secondary | ICD-10-CM | POA: Diagnosis not present

## 2018-09-27 ENCOUNTER — Other Ambulatory Visit: Payer: Self-pay | Admitting: Family Medicine

## 2018-09-27 ENCOUNTER — Telehealth: Payer: Self-pay | Admitting: Family Medicine

## 2018-09-27 DIAGNOSIS — N200 Calculus of kidney: Secondary | ICD-10-CM

## 2018-09-27 NOTE — Telephone Encounter (Signed)
-----   Message from [name], MD sent at 09/26/2018  7:49 AM EST ----- Renal calculi have increased in size compared with his x-ray of last year.  Would consider treatment with either shockwave lithotripsy or ureteroscopy.  If he desires to continue surveillance would repeat a KUB in 6 months.

## 2018-09-27 NOTE — Telephone Encounter (Signed)
LMOM for patient to return call.

## 2018-09-27 NOTE — Telephone Encounter (Signed)
Order in.

## 2018-09-27 NOTE — Telephone Encounter (Signed)
Pt returned call and I read message from [facility].  He would like to have a repeat KUB in 6 months.

## 2018-09-28 NOTE — Progress Notes (Signed)
I reviewed health advisor's note, was available for consultation, and agree with documentation and plan.  

## 2018-10-07 ENCOUNTER — Telehealth: Payer: Self-pay | Admitting: Urology

## 2018-10-08 ENCOUNTER — Other Ambulatory Visit: Payer: Self-pay

## 2018-10-08 MED ORDER — FLUOXETINE HCL 40 MG PO CAPS: 40.0000 mg | ORAL_CAPSULE | Freq: Every day | ORAL | 3 refills | Status: DC

## 2018-10-08 MED ORDER — ROPINIROLE HCL 0.5 MG PO TABS: 0.5000 mg | ORAL_TABLET | Freq: Every day | ORAL | 3 refills | Status: DC

## 2018-10-08 MED ORDER — FLUTICASONE PROPIONATE 50 MCG/ACT NA SUSP: 2.0000 | Freq: Every day | NASAL | 3 refills | Status: DC

## 2018-10-08 MED ORDER — OMEPRAZOLE 20 MG PO CPDR: DELAYED_RELEASE_CAPSULE | ORAL | 3 refills | Status: DC

## 2018-10-08 MED ORDER — FLUOXETINE HCL 20 MG PO CAPS: 20.0000 mg | ORAL_CAPSULE | Freq: Every day | ORAL | 3 refills | Status: DC

## 2018-10-08 MED ORDER — ATORVASTATIN CALCIUM 20 MG PO TABS: 20.0000 mg | ORAL_TABLET | Freq: Every day | ORAL | 3 refills | Status: DC

## 2018-10-08 MED ORDER — FUROSEMIDE 20 MG PO TABS: ORAL_TABLET | ORAL | 3 refills | Status: DC

## 2018-10-08 NOTE — Telephone Encounter (Signed)
E-scribed all meds except naproxen.  Naproxen Last rx:  04/16/18, #180/1 Last OV:  08/16/18, CPE Pt 2 Next OV:  08/20/19, CPE Pt 2

## 2018-10-09 ENCOUNTER — Other Ambulatory Visit: Payer: Self-pay | Admitting: Family Medicine

## 2018-10-09 NOTE — Telephone Encounter (Signed)
ERROR

## 2018-10-10 MED ORDER — NAPROXEN 500 MG PO TABS: ORAL_TABLET | ORAL | 1 refills | Status: DC

## 2018-10-15 ENCOUNTER — Telehealth: Payer: Self-pay | Admitting: Pain Medicine

## 2018-10-15 NOTE — Telephone Encounter (Signed)
If patient has not had wrist injections done before and has not been seen here for this condition, he needs to come in for an evaluation first for a new problem.

## 2018-10-15 NOTE — Telephone Encounter (Signed)
Patient lvmail asking to come in for wrist injections to relieve tendonitis pain. Can this be scheduled or does it need eval appt first

## 2018-10-16 NOTE — Telephone Encounter (Signed)
Spoke with patient he has meds mgmt on April 8 and he will talk with CKing at that time about wrist pain.

## 2018-10-21 DIAGNOSIS — R195 Other fecal abnormalities: Secondary | ICD-10-CM | POA: Diagnosis not present

## 2018-11-13 ENCOUNTER — Telehealth: Payer: Self-pay | Admitting: *Deleted

## 2018-11-13 ENCOUNTER — Other Ambulatory Visit: Payer: Self-pay

## 2018-11-13 ENCOUNTER — Ambulatory Visit: Payer: Medicare Other | Attending: Nurse Practitioner | Admitting: Nurse Practitioner

## 2018-11-13 DIAGNOSIS — M65321 Trigger finger, right index finger: Secondary | ICD-10-CM | POA: Diagnosis not present

## 2018-11-13 DIAGNOSIS — M47816 Spondylosis without myelopathy or radiculopathy, lumbar region: Secondary | ICD-10-CM | POA: Diagnosis not present

## 2018-11-13 DIAGNOSIS — M4722 Other spondylosis with radiculopathy, cervical region: Secondary | ICD-10-CM | POA: Diagnosis not present

## 2018-11-13 DIAGNOSIS — G894 Chronic pain syndrome: Secondary | ICD-10-CM | POA: Diagnosis not present

## 2018-11-13 MED ORDER — OXYCODONE HCL 5 MG PO TABS: 5.0000 mg | ORAL_TABLET | Freq: Four times a day (QID) | ORAL | 0 refills | Status: DC | PRN

## 2018-11-13 MED ORDER — OXYCODONE HCL ER 10 MG PO T12A: 10.0000 mg | EXTENDED_RELEASE_TABLET | Freq: Two times a day (BID) | ORAL | 0 refills | Status: DC

## 2018-11-13 MED ORDER — PREDNISONE 20 MG PO TABS: ORAL_TABLET | ORAL | 0 refills | Status: AC

## 2018-11-13 NOTE — Progress Notes (Signed)
Pain Management Encounter Note - Virtual Visit via Telephone Telehealth (real-time audio visits between healthcare provider and patient).  Patient's Phone No. & Preferred Pharmacy:  [phone] (home); [phone] (mobile); (Preferred) [phone]  [facility] - [place], [place] - [address] [place] [address] Phone: [phone] Fax: [phone]  [facility] - [place], [place] - [phone] [facility] [address] [place] [facility] [place] [address] Phone: [phone] Fax: [phone]   Pre-screening note:  Our staff contacted [name] and offered him an "in person", "face-to-face" appointment versus a telephone encounter. He indicated preferring the telephone encounter, at this time.  Reason for Virtual Visit: COVID-19*  Social distancing based on CDC and AMA recommendations.   I contacted Mark [name] on 11/13/2018 at 7:34 PM by telephone and clearly identified myself as Mark Ranger, NP. I verified that I was speaking with the correct person using two identifiers (Name and date of birth: 27-Nov-1955).  Advanced Informed Consent I sought verbal advanced consent from Mark [name] for telemedicine interactions and virtual visit. I informed [name] of the security and privacy concerns, risks, and limitations associated with performing an evaluation and management service by telephone. I also informed [name] of the availability of "in person" appointments and I informed him of the possibility of a patient responsible charge related to this service. [name] expressed understanding and agreed to proceed.   Historic Elements   [name] is a 63 y.o. year old, male patient evaluated today after his last encounter by our practice on 10/15/2018. Mr. Weekly  has a past medical history of Anxiety, Arthritis, Chronic pain, Depression, Heart murmur, History of kidney stones (April 12,2017), History of permanent cardiac pacemaker  placement (05/31/2015), Hydrocele in adult (05/07/2015), Kidney stones, Migraine, MRSA carrier (2019), Presence of permanent cardiac pacemaker, Prostatitis, Sleep apnea, and Sleep apnea. He also  has a past surgical history that includes Tonsillectomy (1964); Carpal tunnel release (Bilateral, 2006); Ulnar nerve repair (2008); Cervical spine surgery (2008); Rotator cuff repair (Left, 2009); pacemaker placement (2009); Lithotripsy (Bilateral, 2012); Lithotripsy (Left, 11/2015); Cardiovascular stress test (2014); Colonoscopy (07/2007); implantable cardioverter defibrillator (icd) generator change (Left, 12/13/2017); and pace maker revision (12/13/2017). Mr. Phariss has a current medication list which includes the following prescription(s): aspirin, atorvastatin, cetirizine, vitamin d, clindamycin, cranberry, desonide, diphenhydramine, docusate sodium, ferrous sulfate, fluoxetine, fluoxetine, fluticasone, furosemide, garlic, multiple vitamin, naloxone, naproxen, omega-3, omeprazole, oxycodone, oxycodone, oxycodone, oxycodone, oxycodone, oxycodone, potassium citrate, prednisone, ropinirole, saw palmetto (serenoa repens), sildenafil, vitamin b complex-c, and vitamin e. He  reports that he has never smoked. He has never used smokeless tobacco. He reports that he does not drink alcohol or use drugs. Mr. Dermody is allergic to ciprofloxacin; metformin; buprenorphine hcl; morphine and related; and penicillins.   HPI  I last saw him on 08/15/2018. He is being evaluated for medication management. He is having 3/10 neck and shoudler pain. He is having wrist and finger pain. He is wearing  brace that is not effective. He has sharp pain worse with bending. He is having stiffness in his hand worse in the morning.  He was seen by orthopedics at [facility] in Sept 2019.He did have X-rays but he did not get an injection. He was hoping to get some relief with the brace however this has not occurred. He would like to get some additional  help.   Pharmacotherapy Assessment  Analgesic:Oxycodone ER 10 mg every 12 hours (20 mg/day) +  oxycodone IR 5 mg every 6 hours (20 mg/day) MME/day:60 mg/day.  Monitoring: Pharmacotherapy: No side-effects or adverse reactions reported. [place] PMP: PDMP reviewed during this encounter.       Compliance: No problems identified. Plan: Refer to "POC".  Review of recent tests  Abdomen 1 view (KUB) CLINICAL DATA:  Nephrolithiasis.  EXAM: ABDOMEN - 1 VIEW  COMPARISON:  10/08/2017  FINDINGS: Bilateral renal calculi appear larger on today's study. Right lower pole calculus approximately 15 mm. Two calculi left lower pole appear larger measuring approximately 4 mm, and 8 mm in diameter. Phleboliths in the right pelvis unchanged  Normal bowel gas pattern.  No acute skeletal abnormality.  IMPRESSION: Bilateral renal calculi appear larger.  Electronically Signed   By: Marlan Palauharles  Clark M.D.   On: 09/25/2018 09:24   Office Visit on 08/16/2018  Component Date Value Ref Range Status  . Hemoglobin A1C 08/16/2018 5.5  4.0 - 5.6 % Final   Assessment  The primary encounter diagnosis was Cervical spondylosis with radiculopathy (Bilateral) (L>R). Diagnoses of Trigger finger, right index finger, Lumbar facet syndrome (Bilateral), and Chronic pain syndrome were also pertinent to this visit.  Plan of Care  I have changed Mark [name]'s oxyCODONE, oxyCODONE, oxyCODONE, oxyCODONE, oxyCODONE, and oxyCODONE. I am also having him start on predniSONE. Additionally, I am having him maintain his cetirizine, aspirin, Multiple Vitamin (MULTIVITAMIN PO), Garlic, naloxone, Vitamin B Complex-C, CRANBERRY PO, Vitamin D, (Saw Palmetto, Serenoa repens, (SAW PALMETTO PO)), vitamin E, docusate sodium, Omega-3, desonide, FERROUS SULFATE PO, diphenhydrAMINE, clindamycin, sildenafil, potassium citrate, furosemide, fluticasone, rOPINIRole, FLUoxetine, FLUoxetine, atorvastatin, omeprazole, and naproxen.  Pharmacotherapy  (Medications Ordered): Meds ordered this encounter  Medications  . predniSONE (DELTASONE) 20 MG tablet    Sig: Take 3 tab(s) in the morning x 3 days, then 2 tab(s) x 3 days, followed by 1 tab x 3 days.    Dispense:  21 tablet    Refill:  0    Do not add to the "Automatic Refill" notification system.    Order Specific Question:   Supervising Provider    Answer:   Delano MetzNAVEIRA, FRANCISCO [phone][[id number]]  . oxyCODONE (OXY IR/ROXICODONE) 5 MG immediate release tablet    Sig: Take 1 tablet (5 mg total) by mouth every 6 (six) hours as needed for up to 30 days for severe pain.    Dispense:  120 tablet    Refill:  0    Do not add this medication to the electronic "Automatic Refill" notification system. Patient may have prescription filled one day early if pharmacy is closed on scheduled refill date.    Order Specific Question:   Supervising Provider    Answer:   Delano MetzNAVEIRA, FRANCISCO [phone][[id number]]  . oxyCODONE (OXY IR/ROXICODONE) 5 MG immediate release tablet    Sig: Take 1 tablet (5 mg total) by mouth every 6 (six) hours as needed for up to 30 days for severe pain.    Dispense:  120 tablet    Refill:  0    Do not add this medication to the electronic "Automatic Refill" notification system. Patient may have prescription filled one day early if pharmacy is closed on scheduled refill date.    Order Specific Question:   Supervising Provider    Answer:   Delano MetzNAVEIRA, FRANCISCO [phone][[id number]]  . oxyCODONE (OXY IR/ROXICODONE) 5 MG immediate release tablet    Sig: Take 1 tablet (5 mg total) by mouth every 6 (six) hours as needed for up to 30 days for severe pain.  Dispense:  120 tablet    Refill:  0    Do not add this medication to the electronic "Automatic Refill" notification system. Patient may have prescription filled one day early if pharmacy is closed on scheduled refill date.    Order Specific Question:   Supervising Provider    Answer:   [name] [phone]  . oxyCODONE (OXYCONTIN) 10 mg 12 hr tablet    Sig:  Take 1 tablet (10 mg total) by mouth every 12 (twelve) hours for 30 days.    Dispense:  60 tablet    Refill:  0    Do not add this medication to the electronic "Automatic Refill" notification system. Patient may have prescription filled one day early if pharmacy is closed on scheduled refill date.    Order Specific Question:   Supervising Provider    Answer:   [name] [phone]  . oxyCODONE (OXYCONTIN) 10 mg 12 hr tablet    Sig: Take 1 tablet (10 mg total) by mouth every 12 (twelve) hours for 30 days.    Dispense:  60 tablet    Refill:  0    Do not add this medication to the electronic "Automatic Refill" notification system. Patient may have prescription filled one day early if pharmacy is closed on scheduled refill date.    Order Specific Question:   Supervising Provider    Answer:   [name] [phone]  . oxyCODONE (OXYCONTIN) 10 mg 12 hr tablet    Sig: Take 1 tablet (10 mg total) by mouth every 12 (twelve) hours for 30 days.    Dispense:  60 tablet    Refill:  0    Do not add this medication to the electronic "Automatic Refill" notification system. Patient may have prescription filled one day early if pharmacy is closed on scheduled refill date.    Order Specific Question:   Supervising Provider    Answer:   [name] [phone]   Orders:  No orders of the defined types were placed in this encounter.  Follow-up plan:   Return in about 3 months (around 02/12/2019) for MedMgmt.   I discussed the assessment and treatment plan with the patient. The patient was provided an opportunity to ask questions and all were answered. The patient agreed with the plan and demonstrated an understanding of the instructions.  Patient advised to call back or seek an in-person evaluation if the symptoms or condition worsens.  Total duration of non-face-to-face encounter: 15 minutes.  Note by: Mark Ranger, NP   Disclaimer:  * Given the special circumstances of the COVID-19  pandemic, the federal government has announced that the Office for Civil Rights (OCR) will exercise its enforcement discretion and will not impose penalties on physicians using telehealth in the event of noncompliance with regulatory requirements under the Health Insurance Portability and Accountability Act (HIPAA) in connection with the good faith provision of telehealth during the COVID-19 national public health emergency. (AMA)

## 2018-11-18 ENCOUNTER — Ambulatory Visit: Admit: 2018-11-18 | Payer: Medicare Other | Admitting: Unknown Physician Specialty

## 2018-11-18 SURGERY — ESOPHAGOGASTRODUODENOSCOPY (EGD) WITH PROPOFOL
Anesthesia: General

## 2018-12-19 DIAGNOSIS — E782 Mixed hyperlipidemia: Secondary | ICD-10-CM | POA: Diagnosis not present

## 2018-12-30 ENCOUNTER — Other Ambulatory Visit: Payer: Self-pay | Admitting: Family Medicine

## 2018-12-31 NOTE — Telephone Encounter (Signed)
Naproxen Last filled:  10/10/18, #180 Last OV:  08/16/18, CPE Pt 2 Next OV:  08/20/19, CPE Pt 2

## 2019-01-20 ENCOUNTER — Telehealth: Payer: Self-pay | Admitting: *Deleted

## 2019-02-11 ENCOUNTER — Telehealth: Payer: Self-pay | Admitting: Pain Medicine

## 2019-02-11 NOTE — Telephone Encounter (Signed)
Left voicemail with patient to please return the call to go over medications for scheduled appt tomorrow.

## 2019-02-11 NOTE — Telephone Encounter (Signed)
Pt left a voicemail stating he was returning a nurses call to answer questions before his visit.

## 2019-02-12 ENCOUNTER — Ambulatory Visit: Payer: Medicare Other | Attending: Nurse Practitioner | Admitting: Pain Medicine

## 2019-02-12 ENCOUNTER — Other Ambulatory Visit: Payer: Self-pay

## 2019-02-12 DIAGNOSIS — G894 Chronic pain syndrome: Secondary | ICD-10-CM | POA: Diagnosis not present

## 2019-02-12 DIAGNOSIS — M542 Cervicalgia: Secondary | ICD-10-CM

## 2019-02-12 DIAGNOSIS — M65321 Trigger finger, right index finger: Secondary | ICD-10-CM

## 2019-02-12 DIAGNOSIS — G8929 Other chronic pain: Secondary | ICD-10-CM

## 2019-02-12 DIAGNOSIS — M65311 Trigger thumb, right thumb: Secondary | ICD-10-CM

## 2019-02-12 MED ORDER — OXYCODONE HCL 10 MG PO TABS: 10.0000 mg | ORAL_TABLET | Freq: Four times a day (QID) | ORAL | 0 refills | Status: DC | PRN

## 2019-02-12 NOTE — Progress Notes (Signed)
Pain Management Virtual Encounter Note - Virtual Visit via Telephone Telehealth (real-time audio visits between healthcare provider and patient).   Patient's Phone No. & Preferred Pharmacy:  [phone] (home); [phone] (mobile); (Preferred) [phone] [email or web address]  [facility] [place] - [address] [place] [id number] Phone: [phone] Fax: [phone]    Pre-screening note:  Our staff contacted [name] and offered him an "in person", "face-to-face" appointment versus a telephone encounter. He indicated preferring the telephone encounter, at this time.   Reason for Virtual Visit: COVID-19*  Social distancing based on CDC and AMA recommendations.   I contacted Mark Hansonennis K Standing on 02/12/2019 via telephone.      I clearly identified myself as Oswaldo DoneFrancisco A Montrelle Eddings, MD. I verified that I was speaking with the correct person using two identifiers (Name: Mark HansonDennis K Sedlak, and date of birth: 10/16/1955).  Advanced Informed Consent I sought verbal advanced consent from Mark [name] for virtual visit interactions. I informed [name] of possible security and privacy concerns, risks, and limitations associated with providing "not-in-person" medical evaluation and management services. I also informed [name] of the availability of "in-person" appointments. Finally, I informed him that there would be a charge for the virtual visit and that he could be  personally, fully or partially, financially responsible for it. [name] expressed understanding and agreed to proceed.   Historic Elements   [name] Stotts is a 63 y.o. year old, male patient evaluated today after his last encounter by our practice on 02/11/2019. [name]  has a past medical history of Anxiety, Arthritis, Chronic pain, Depression, Heart murmur, History of kidney stones (April 12,2017), History of permanent cardiac pacemaker placement (05/31/2015), Hydrocele in adult (05/07/2015),  Kidney stones, Migraine, MRSA carrier (2019), Presence of permanent cardiac pacemaker, Prostatitis, Sleep apnea, and Sleep apnea. He also  has a past surgical history that includes Tonsillectomy (1964); Carpal tunnel release (Bilateral, 2006); Ulnar nerve repair (2008); Cervical spine surgery (2008); Rotator cuff repair (Left, 2009); pacemaker placement (2009); Lithotripsy (Bilateral, 2012); Lithotripsy (Left, 11/2015); Cardiovascular stress test (2014); Colonoscopy (07/2007); implantable cardioverter defibrillator (icd) generator change (Left, 12/13/2017); and pace maker revision (12/13/2017). [name] has a current medication list which includes the following prescription(s): aspirin, atorvastatin, cetirizine, vitamin d, clindamycin, cranberry, desonide, diphenhydramine, docusate sodium, ferrous sulfate, fluoxetine, fluoxetine, fluticasone, furosemide, garlic, multiple vitamin, naloxone, naproxen, omega-3, omeprazole, oxycodone, oxycodone, oxycodone hcl, oxycodone hcl, oxycodone hcl, potassium citrate, ropinirole, saw palmetto (serenoa repens), sildenafil, vitamin b complex-c, and vitamin e. He  reports that he has never smoked. He has never used smokeless tobacco. He reports that he does not drink alcohol or use drugs. [name] is allergic to ciprofloxacin; metformin; buprenorphine hcl; morphine and related; and penicillins.   HPI  Today, he is being contacted for medication management.  The patient indicates that he is not having any problems or side effects with his medications.  However, he currently takes Oxycodone ER (Oxycontin) 10 mg, 1 tab PO q 12 hrs (20 mg/day) + oxycodone IR 5 mg, 1 tab PO q 6 hrs (20 mg/day) for a total of 40 mg/day of oxycodone.  Furthermore, the OxyContin is not approved by his insurance company and he has to get preapproval every time that he gets a prescription.  This means that he has to take 6 different tablets per day to get the 40 mg of oxycodone.  After having reviewed  this, I have offered the patient the alternative to  switch to oxycodone IR 10 mg p.o. every 6 hours, where he would still get the same 40 mg/day, but in a pill that is approved by his insurance company.  Because it is oxycodone IR he can also break the tablet in half should he feel that he does not need to take the full dose.  This is something that he cannot do with the OxyContin.  In addition to this we spent quite a bit of time talking about problems he is experiencing in his right hand with trigger finger pain.  He has seen an orthopedic surgeon who recommended some injections and today we have offered those to him.  He is a little concerned about the COVID-19 situation and therefore he wants to hold for now.  He indicates that he was given some oral prednisone around 11/16/2018, which did help quite a bit.  Unfortunately he did not really follow instructions on how to take it and today I had to go over that in great detail to explain to him that if he does not follow the instructions as laid down in the prescription, then he run the risk of having adrenal suppression and possibly Addison's disease.  He understood and accepted.  Today I have given him the option of a PRN trigger finger injection.  He will call us when he feels that it is safe for him to come in.  Pharmacotherapy Assessment  Analgesic: Oxycodone IR 10 mg, 1 tab PO q 6 hrs (40 mg/day total of oxycodone) MME/day:60 mg/day.   Monitoring: Pharmacotherapy: No side-effects or adverse reactions reported. [place] PMP: PDMP reviewed during this encounter.       Compliance: No problems identified. Effectiveness: Clinically acceptable. Plan: Refer to "POC".  Pertinent Labs   SAFETY SCREENING Profile Lab Results  Component Value Date   STAPHAUREUS POSITIVE (A) 12/06/2017   MRSAPCR POSITIVE (A) 12/06/2017   HCVAB NEGATIVE 07/22/2013   Renal Function Lab Results  Component Value Date   BUN 24 (H) 08/09/2018   CREATININE 0.98 08/09/2018    GFRAA >60 12/06/2017   GFRNONAA >60 12/06/2017   Hepatic Function Lab Results  Component Value Date   AST 23 08/09/2018   ALT 26 08/09/2018   ALBUMIN 3.9 08/09/2018   UDS Summary  Date Value Ref Range Status  08/15/2018 FINAL  Final    Comment:    ==================================================================== TOXASSURE SELECT 13 (MW) ==================================================================== Test                             Result       Flag       Units Drug Present and Declared for Prescription Verification   Oxycodone                      1723         EXPECTED   ng/mg creat   Noroxycodone                   3560         EXPECTED   ng/mg creat    Sources of oxycodone include scheduled prescription medications.    Noroxycodone is an expected metabolite of oxycodone. ==================================================================== Test                      Result    Flag   Units      Ref Range   Creatinine  40               mg/dL      >=20 ==================================================================== Declared Medications:  The flagging and interpretation on this report are based on the  following declared medications.  Unexpected results may arise from  inaccuracies in the declared medications.  **Note: The testing scope of this panel includes these medications:  Oxycodone  **Note: The testing scope of this panel does not include following  reported medications:  Aspirin  Atorvastatin (Lipitor)  Cetirizine (Zyrtec)  Cranberry Extract  Desonide  Docusate  Fluoxetine  Fluticasone  Furosemide  Multivitamin  Naloxone  Naproxen  Omega-3 Fatty Acids  Omeprazole  Potassium Citrate  Ropinirole  Sildenafil  Supplement (Saw Palmetto)  Vitamin B  Vitamin D  Vitamin E ==================================================================== For clinical consultation, please call [phone]. ====================================================================    Note: Above Lab results reviewed.  Recent imaging  Abdomen 1 view (KUB) CLINICAL DATA:  Nephrolithiasis.  EXAM: ABDOMEN - 1 VIEW  COMPARISON:  10/08/2017  FINDINGS: Bilateral renal calculi appear larger on today's study. Right lower pole calculus approximately 15 mm. Two calculi left lower pole appear larger measuring approximately 4 mm, and 8 mm in diameter. Phleboliths in the right pelvis unchanged  Normal bowel gas pattern.  No acute skeletal abnormality.  IMPRESSION: Bilateral renal calculi appear larger.  Electronically Signed   By: Franchot Gallo M.D.   On: 09/25/2018 09:24  Assessment  The primary encounter diagnosis was Chronic pain syndrome. Diagnoses of Chronic neck pain (Primary Area of Pain) (Bilateral) (L>R), Cervicalgia, Trigger finger, right index finger, and Trigger finger of thumb (Right) were also pertinent to this visit.  Plan of Care  I am having Bea Graff. Aries start on Oxycodone HCl, Oxycodone HCl, and Oxycodone HCl. I am also having him maintain his cetirizine, aspirin, Multiple Vitamin (MULTIVITAMIN PO), Garlic, naloxone, Vitamin B Complex-C, CRANBERRY PO, Vitamin D, (Saw Palmetto, Serenoa repens, (SAW PALMETTO PO)), vitamin E, docusate sodium, Omega-3, desonide, FERROUS SULFATE PO, diphenhydrAMINE, clindamycin, sildenafil, potassium citrate, furosemide, fluticasone, rOPINIRole, FLUoxetine, FLUoxetine, atorvastatin, omeprazole, naproxen, oxyCODONE, and oxyCODONE.  Pharmacotherapy (Medications Ordered): Meds ordered this encounter  Medications  . Oxycodone HCl 10 MG TABS    Sig: Take 1 tablet (10 mg total) by mouth every 6 (six) hours as needed. Must last 30 days    Dispense:  120 tablet    Refill:  0    Chronic Pain: STOP Act (Not applicable) Fill 1 day early if closed on refill date. Do not fill until: 02/14/2019. To last until: 03/16/2019. Avoid benzodiazepines within 8  hours of opioids  . Oxycodone HCl 10 MG TABS    Sig: Take 1 tablet (10 mg total) by mouth every 6 (six) hours as needed. Must last 30 days    Dispense:  120 tablet    Refill:  0    Chronic Pain: STOP Act (Not applicable) Fill 1 day early if closed on refill date. Do not fill until: 03/16/2019. To last until: 04/15/2019. Avoid benzodiazepines within 8 hours of opioids  . Oxycodone HCl 10 MG TABS    Sig: Take 1 tablet (10 mg total) by mouth every 6 (six) hours as needed. Must last 30 days    Dispense:  120 tablet    Refill:  0    Chronic Pain: STOP Act (Not applicable) Fill 1 day early if closed on refill date. Do not fill until: 04/15/2019. To last until: 05/15/2019. Avoid benzodiazepines within 8 hours of opioids  Orders:  Orders Placed This Encounter  Procedures  . Injection tendon or ligament    Standing Status:   Standing    Number of Occurrences:   6    Standing Expiration Date:   02/12/2020    Scheduling Instructions:     Type of Block:  Trigger Finger injection     Side: Right-sided     Sedation: No Sedation.     Timeframe: PRN Procedure. Patient will call.   Follow-up plan:   Return for (VV), E/M (MM), in addition, PRN Procedure: (R) Hand Trigger Finger inj. (no sedation).     Considering:  Diagnostic right hand digit 2 trigger finger injection Therapeutic bilateral intra-articular Hyalgan knee injections  Diagnostic bilateral cervical facet block  Possible bilateral cervical facet radiofrequencyablation  Diagnostic left sided cervical epiduralsteroid injection  Diagnostic bilateral intra-articular knee injection Diagnostic bilateral genicular nerve blocks Possible bilateral genicular nerve radiofrequencyablation  Diagnostic bilateral sacroiliac joint block Possible bilateral sacroiliac joint radiofrequencyablation  Diagnostic bilateral intra-articular shoulder jointinjection  Diagnostic bilateral suprascapular nerve block Possible bilateral suprascapular nerve  radiofrequencyablation  Diagnostic bilateral lumbar facet block Possible bilateral lumbar facet radiofrequencyablation  Diagnostic left L4-5 lumbar epidural steroid injection    Palliative PRN treatment(s):  Bilateral intra-articular Hyalgan knee injectionseries (injection #4)  Diagnostic bilateral cervical facetblock  Diagnostic left sided cervical epiduralsteroid injection  Diagnostic bilateral intra-articular knee injection Diagnostic bilateral genicular nerve blocks Diagnostic bilateral sacroiliac joint block Diagnostic bilateral intra-articular shoulder joint injection  Diagnostic bilateral suprascapularnerve block  Diagnostic bilateral lumbar facetblock  Diagnostic left L4-5 lumbar epidural steroid injection    Recent Visits No visits were found meeting these conditions.  Showing recent visits within past 90 days and meeting all other requirements   Today's Visits Date Type Provider Dept  02/12/19 Office Visit Delano MetzNaveira, Jeriah Skufca, MD [facility]  Showing today's visits and meeting all other requirements   Future Appointments No visits were found meeting these conditions.  Showing future appointments within next 90 days and meeting all other requirements   I discussed the assessment and treatment plan with the patient. The patient was provided an opportunity to ask questions and all were answered. The patient agreed with the plan and demonstrated an understanding of the instructions.  Patient advised to call back or seek an in-person evaluation if the symptoms or condition worsens.  Total duration of non-face-to-face encounter: 30 minutes.  Note by: Oswaldo DoneFrancisco A Solimar Maiden, MD Date: 02/12/2019; Time: 4:40 PM  Note: This dictation was prepared with Dragon dictation. Any transcriptional errors that may result from this process are unintentional.  Disclaimer:  * Given the special circumstances of the COVID-19 pandemic, the federal government has announced  that the Office for Civil Rights (OCR) will exercise its enforcement discretion and will not impose penalties on physicians using telehealth in the event of noncompliance with regulatory requirements under the [employer] Insurance Portability and Accountability Act (HIPAA) in connection with the good faith provision of telehealth during the COVID-19 national public health emergency. (AMA)

## 2019-02-12 NOTE — Patient Instructions (Signed)
____________________________________________________________________________________________  Preparing for your procedure (without sedation)  Procedure appointments are limited to planned procedures: . No Prescription Refills. . No disability issues will be discussed. . No medication changes will be discussed.  Instructions: . Oral Intake: Do not eat or drink anything for at least 3 hours prior to your procedure. . Transportation: Unless otherwise stated by your physician, you may drive yourself after the procedure. . Blood Pressure Medicine: Take your blood pressure medicine with a sip of water the morning of the procedure. . Blood thinners: Notify our staff if you are taking any blood thinners. Depending on which one you take, there will be specific instructions on how and when to stop it. . Diabetics on insulin: Notify the staff so that you can be scheduled 1st case in the morning. If your diabetes requires high dose insulin, take only  of your normal insulin dose the morning of the procedure and notify the staff that you have done so. . Preventing infections: Shower with an antibacterial soap the morning of your procedure.  . Build-up your immune system: Take 1000 mg of Vitamin C with every meal (3 times a day) the day prior to your procedure. . Antibiotics: Inform the staff if you have a condition or reason that requires you to take antibiotics before dental procedures. . Pregnancy: If you are pregnant, call and cancel the procedure. . Sickness: If you have a cold, fever, or any active infections, call and cancel the procedure. . Arrival: You must be in the facility at least 30 minutes prior to your scheduled procedure. . Children: Do not bring any children with you. . Dress appropriately: Bring dark clothing that you would not mind if they get stained. . Valuables: Do not bring any jewelry or valuables.  Reasons to call and reschedule or cancel your procedure: (Following these  recommendations will minimize the risk of a serious complication.) . Surgeries: Avoid having procedures within 2 weeks of any surgery. (Avoid for 2 weeks before or after any surgery). . Flu Shots: Avoid having procedures within 2 weeks of a flu shots or . (Avoid for 2 weeks before or after immunizations). . Barium: Avoid having a procedure within 7-10 days after having had a radiological study involving the use of radiological contrast. (Myelograms, Barium swallow or enema study). . Heart attacks: Avoid any elective procedures or surgeries for the initial 6 months after a "Myocardial Infarction" (Heart Attack). . Blood thinners: It is imperative that you stop these medications before procedures. Let us know if you if you take any blood thinner.  . Infection: Avoid procedures during or within two weeks of an infection (including chest colds or gastrointestinal problems). Symptoms associated with infections include: Localized redness, fever, chills, night sweats or profuse sweating, burning sensation when voiding, cough, congestion, stuffiness, runny nose, sore throat, diarrhea, nausea, vomiting, cold or Flu symptoms, recent or current infections. It is specially important if the infection is over the area that we intend to treat. . Heart and lung problems: Symptoms that may suggest an active cardiopulmonary problem include: cough, chest pain, breathing difficulties or shortness of breath, dizziness, ankle swelling, uncontrolled high or unusually low blood pressure, and/or palpitations. If you are experiencing any of these symptoms, cancel your procedure and contact your primary care physician for an evaluation.  Remember:  Regular Business hours are:  Monday to Thursday 8:00 AM to 4:00 PM  Provider's Schedule: Jaydalyn Demattia, MD:  Procedure days: Tuesday and Thursday 7:30 AM to 4:00 PM  Bilal   Lateef, MD:  Procedure days: Monday and Wednesday 7:30 AM to 4:00  PM ____________________________________________________________________________________________    

## 2019-03-20 ENCOUNTER — Encounter: Payer: Self-pay | Admitting: Pain Medicine

## 2019-03-23 NOTE — Progress Notes (Signed)
Pain Management Virtual Encounter Note - Virtual Visit via Telephone Telehealth (real-time audio visits between healthcare provider and patient).   Patient's Phone No. & Preferred Pharmacy:  [phone] (home); [phone] (mobile); (Preferred) [phone] [email or web address]  [facility] [place] [address] [place] [address] Phone: [phone] Fax: [phone]    Pre-screening note:  Our staff contacted Mark [name] and offered him an "in person", "face-to-face" appointment versus a telephone encounter. He indicated preferring the telephone encounter, at this time.   Reason for Virtual Visit: COVID-19*  Social distancing based on CDC and AMA recommendations.   I contacted Mark Hansonennis [name] Nish on 03/24/2019 via telephone.      I clearly identified myself as Mark DoneFrancisco A Hajime Asfaw, MD. I verified that I was speaking with the correct person using two identifiers (Name: Mark [name] Ade, and date of birth: 01/09/1956).  Advanced Informed Consent I sought verbal advanced consent from Mark [name] for virtual visit interactions. I informed Mark [name] of possible security and privacy concerns, risks, and limitations associated with providing "not-in-person" medical evaluation and management services. I also informed Mark [name] of the availability of "in-person" appointments. Finally, I informed him that there would be a charge for the virtual visit and that he could be  personally, fully or partially, financially responsible for it. Mark [name] expressed understanding and agreed to proceed.   Historic Elements   [name] Bible is a 63 y.o. year old, male patient evaluated today after his last encounter by our practice on 02/12/2019. Mark [name]  has a past medical history of Anxiety, Arthritis, Chronic pain, Depression, Heart murmur, History of kidney stones (April 12,2017), History of permanent cardiac pacemaker placement (05/31/2015), Hydrocele in adult (05/07/2015),  Kidney stones, Migraine, MRSA carrier (2019), Presence of permanent cardiac pacemaker, Prostatitis, Sleep apnea, and Sleep apnea. He also  has a past surgical history that includes Tonsillectomy (1964); Carpal tunnel release (Bilateral, 2006); Ulnar nerve repair (2008); Cervical spine surgery (2008); Rotator cuff repair (Left, 2009); pacemaker placement (2009); Lithotripsy (Bilateral, 2012); Lithotripsy (Left, 11/2015); Cardiovascular stress test (2014); Colonoscopy (07/2007); implantable cardioverter defibrillator (icd) generator change (Left, 12/13/2017); and pace maker revision (12/13/2017). Mark [name] has a current medication list which includes the following prescription(s): aspirin, atorvastatin, cetirizine, vitamin d, clindamycin, cranberry, desonide, diphenhydramine, docusate sodium, fluoxetine, fluoxetine, fluticasone, furosemide, garlic, melatonin, multiple vitamin, naloxone, omega-3, omeprazole, oxycodone hcl, oxycodone hcl, potassium citrate, ropinirole, saw palmetto (serenoa repens), sildenafil, vitamin b complex-c, vitamin e, and oxycodone hcl. He  reports that he has never smoked. He has never used smokeless tobacco. He reports that he does not drink alcohol or use drugs. Mark [name] is allergic to ciprofloxacin; metformin; buprenorphine hcl; morphine and related; and penicillins.   HPI  Today, he is being contacted for medication management.  The patient indicates doing well with the current medication regimen. No adverse reactions or side effects reported to the medications.   Pharmacotherapy Assessment  Analgesic: Oxycodone IR 10 mg, 1 tab PO q 6 hrs (40 mg/day total of oxycodone) MME/day:60 mg/day.   Monitoring: Pharmacotherapy: No side-effects or adverse reactions reported. [place] PMP: PDMP reviewed during this encounter.       Compliance: No problems identified. Effectiveness: Clinically acceptable. Plan: Refer to "POC".  UDS:  Summary  Date Value Ref Range Status  08/15/2018 FINAL   Final    Comment:    ==================================================================== TOXASSURE SELECT 13 (MW) ==================================================================== Test  Result       Flag       Units Drug Present and Declared for Prescription Verification   Oxycodone                      1723         EXPECTED   ng/mg creat   Noroxycodone                   3560         EXPECTED   ng/mg creat    Sources of oxycodone include scheduled prescription medications.    Noroxycodone is an expected metabolite of oxycodone. ==================================================================== Test                      Result    Flag   Units      Ref Range   Creatinine              40               mg/dL      >=20 ==================================================================== Declared Medications:  The flagging and interpretation on this report are based on the  following declared medications.  Unexpected results may arise from  inaccuracies in the declared medications.  **Note: The testing scope of this panel includes these medications:  Oxycodone  **Note: The testing scope of this panel does not include following  reported medications:  Aspirin  Atorvastatin (Lipitor)  Cetirizine (Zyrtec)  Cranberry Extract  Desonide  Docusate  Fluoxetine  Fluticasone  Furosemide  Multivitamin  Naloxone  Naproxen  Omega-3 Fatty Acids  Omeprazole  Potassium Citrate  Ropinirole  Sildenafil  Supplement (Saw Palmetto)  Vitamin B  Vitamin D  Vitamin E ==================================================================== For clinical consultation, please call [phone]. ====================================================================    Laboratory Chemistry Profile (12 mo)  Renal: 08/09/2018: BUN 24; Creatinine, Ser 0.98  Lab Results  Component Value Date   GFRAA >60 12/06/2017   GFRNONAA >60 12/06/2017   Hepatic: 08/09/2018: Albumin  3.9 Lab Results  Component Value Date   AST 23 08/09/2018   ALT 26 08/09/2018   Other: No results found for requested labs within last 8760 hours. Note: Above Lab results reviewed.  Imaging  Last 90 days:  No results found. Last Hospital Admission:  Abdomen 1 View (kub)  Result Date: 09/25/2018 CLINICAL DATA:  Nephrolithiasis. EXAM: ABDOMEN - 1 VIEW COMPARISON:  10/08/2017 FINDINGS: Bilateral renal calculi appear larger on today's study. Right lower pole calculus approximately 15 mm. Two calculi left lower pole appear larger measuring approximately 4 mm, and 8 mm in diameter. Phleboliths in the right pelvis unchanged Normal bowel gas pattern.  No acute skeletal abnormality. IMPRESSION: Bilateral renal calculi appear larger. Electronically Signed   By: Franchot Gallo M.D.   On: 09/25/2018 09:24   Assessment  The primary encounter diagnosis was Chronic pain syndrome. 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  Plan of Care  I have discontinued Simona Huh [name]. Hodsdon's FERROUS SULFATE PO, naproxen, oxyCODONE, and oxyCODONE. I am also having him maintain his cetirizine, aspirin, Multiple Vitamin (MULTIVITAMIN PO), Garlic, naloxone, Vitamin B Complex-C, CRANBERRY PO, Vitamin D, (Saw Palmetto, Serenoa repens, (SAW PALMETTO PO)), vitamin E, docusate sodium, Omega-3, desonide, diphenhydrAMINE, clindamycin, sildenafil, potassium citrate, furosemide, fluticasone, rOPINIRole, FLUoxetine, FLUoxetine, atorvastatin, omeprazole, Oxycodone HCl, Oxycodone HCl, Melatonin, and Oxycodone HCl.  Pharmacotherapy (  Medications Ordered): Meds ordered this encounter  Medications  . Oxycodone HCl 10 MG TABS    Sig: Take 1 tablet (10 mg total) by mouth every 6 (six) hours as needed. Must last 30 days    Dispense:  120 tablet    Refill:  0    Chronic Pain: STOP Act (Not applicable) Fill 1 day early  if closed on refill date. Do not fill until: 05/15/2019. To last until: 06/14/2019. Avoid benzodiazepines within 8 hours of opioids   Orders:  No orders of the defined types were placed in this encounter.  Follow-up plan:   Return in about 3 months (around 06/11/2019) for (VV), E/M (MM).      Considering:  Diagnostic right hand digit 2 trigger finger injection Therapeutic bilateral IA Hyalgan knee injections  Diagnostic bilateral cervical facet block  Possible bilateral cervical facet RFA  Diagnostic left sided CESI  Diagnostic bilateral IA knee injection Diagnostic bilateral genicular NB Possible bilateral genicular nerve RFA  Diagnostic bilateral sacroiliac joint block Possible bilateral sacroiliac joint RFA  Diagnostic bilateral IA shoulder jointinjection  Diagnostic bilateral suprascapular NB Possible bilateral suprascapular nerve RFA  Diagnostic bilateral lumbar facet block Possible bilateral lumbar facet RFA  Diagnostic left L4-5 LESI    Palliative PRN treatment(s):  Bilateral intra-articular Hyalgan knee injectionseries (injection #4)  Diagnostic bilateral cervical facetblock  Diagnostic left sided CESI  Diagnostic bilateral IA knee injection Diagnostic bilateral genicular NB Diagnostic bilateral sacroiliac joint block Diagnostic bilateral IA shoulder joint injection  Diagnostic bilateral suprascapularNB  Diagnostic bilateral lumbar facetblock  Diagnostic left L4-5 LESI    Recent Visits Date Type Provider Dept  02/12/19 Office Visit Delano MetzNaveira, Deandra Goering, MD [facility]  Showing recent visits within past 90 days and meeting all other requirements   Today's Visits Date Type Provider Dept  03/24/19 Office Visit Delano MetzNaveira, Francyne Arreaga, MD [facility]  Showing today's visits and meeting all other requirements   Future Appointments No visits were found meeting these conditions.  Showing future appointments within next 90 days and meeting  all other requirements   I discussed the assessment and treatment plan with the patient. The patient was provided an opportunity to ask questions and all were answered. The patient agreed with the plan and demonstrated an understanding of the instructions.  Patient advised to call back or seek an in-person evaluation if the symptoms or condition worsens.  Total duration of non-face-to-face encounter: 14 minutes.  Note by: Mark DoneFrancisco A Dajia Gunnels, MD Date: 03/24/2019; Time: 3:45 PM  Note: This dictation was prepared with Dragon dictation. Any transcriptional errors that may result from this process are unintentional.  Disclaimer:  * Given the special circumstances of the COVID-19 pandemic, the federal government has announced that the Office for Civil Rights (OCR) will exercise its enforcement discretion and will not impose penalties on physicians using telehealth in the event of noncompliance with regulatory requirements under the [employer] Insurance Portability and Accountability Act (HIPAA) in connection with the good faith provision of telehealth during the COVID-19 national public health emergency. (AMA)

## 2019-03-24 ENCOUNTER — Other Ambulatory Visit: Payer: Self-pay

## 2019-03-24 ENCOUNTER — Ambulatory Visit: Payer: Medicare Other | Attending: Pain Medicine | Admitting: Pain Medicine

## 2019-03-24 DIAGNOSIS — M961 Postlaminectomy syndrome, not elsewhere classified: Secondary | ICD-10-CM | POA: Diagnosis not present

## 2019-03-24 DIAGNOSIS — M542 Cervicalgia: Secondary | ICD-10-CM | POA: Diagnosis not present

## 2019-03-24 DIAGNOSIS — G8929 Other chronic pain: Secondary | ICD-10-CM | POA: Diagnosis not present

## 2019-03-24 DIAGNOSIS — G894 Chronic pain syndrome: Secondary | ICD-10-CM

## 2019-03-24 MED ORDER — OXYCODONE HCL 10 MG PO TABS: 10.0000 mg | ORAL_TABLET | Freq: Four times a day (QID) | ORAL | 0 refills | Status: DC | PRN

## 2019-03-28 DIAGNOSIS — I495 Sick sinus syndrome: Secondary | ICD-10-CM | POA: Diagnosis not present

## 2019-06-10 ENCOUNTER — Encounter: Payer: Self-pay | Admitting: Pain Medicine

## 2019-06-11 ENCOUNTER — Other Ambulatory Visit: Payer: Self-pay

## 2019-06-11 ENCOUNTER — Ambulatory Visit: Payer: Medicare Other | Attending: Pain Medicine | Admitting: Pain Medicine

## 2019-06-11 DIAGNOSIS — G894 Chronic pain syndrome: Secondary | ICD-10-CM

## 2019-06-11 DIAGNOSIS — G8929 Other chronic pain: Secondary | ICD-10-CM

## 2019-06-11 DIAGNOSIS — M961 Postlaminectomy syndrome, not elsewhere classified: Secondary | ICD-10-CM

## 2019-06-11 DIAGNOSIS — M542 Cervicalgia: Secondary | ICD-10-CM

## 2019-06-11 MED ORDER — OXYCODONE HCL 10 MG PO TABS: 10.0000 mg | ORAL_TABLET | Freq: Four times a day (QID) | ORAL | 0 refills | Status: DC | PRN

## 2019-06-11 NOTE — Progress Notes (Signed)
Pain Management Virtual Encounter Note - Virtual Visit via Telephone Telehealth (real-time audio visits between healthcare provider and patient).   Patient's Phone No. & Preferred Pharmacy:  [phone] (home); [phone] (mobile); (Preferred) [phone] [email or web address]  [place] [address] [place] [address] Phone: [phone] Fax: [phone]    Pre-screening note:  Our staff contacted Mark [name] and offered him an "in person", "face-to-face" appointment versus a telephone encounter. He indicated preferring the telephone encounter, at this time.   Reason for Virtual Visit: COVID-19*  Social distancing based on CDC and AMA recommendations.   I contacted Mark [name] on 06/11/2019 via telephone.      I clearly identified myself as Gaspar Cola, MD. I verified that I was speaking with the correct person using two identifiers (Name: Mark [name], and date of birth: 12/05/1955).  Advanced Informed Consent I sought verbal advanced consent from Mark [name] for virtual visit interactions. I informed Mark [name] of possible security and privacy concerns, risks, and limitations associated with providing "not-in-person" medical evaluation and management services. I also informed Mark [name] of the availability of "in-person" appointments. Finally, I informed him that there would be a charge for the virtual visit and that he could be  personally, fully or partially, financially responsible for it. Mark [name] expressed understanding and agreed to proceed.   Historic Elements   Mark [name] is a 63 y.o. year old, male patient evaluated today after his last encounter by our practice on 03/24/2019. Mark [name]  has a past medical history of Anxiety, Arthritis, Chronic pain, Depression, Heart murmur, History of kidney stones (April 12,2017), History of permanent cardiac pacemaker placement (05/31/2015), Hydrocele in adult (05/07/2015),  Kidney stones, Migraine, MRSA carrier (2019), Presence of permanent cardiac pacemaker, Prostatitis, Sleep apnea, and Sleep apnea. He also  has a past surgical history that includes Tonsillectomy (1964); Carpal tunnel release (Bilateral, 2006); Ulnar nerve repair (2008); Cervical spine surgery (2008); Rotator cuff repair (Left, 2009); pacemaker placement (2009); Lithotripsy (Bilateral, 2012); Lithotripsy (Left, 11/2015); Cardiovascular stress test (2014); Colonoscopy (07/2007); implantable cardioverter defibrillator (icd) generator change (Left, 12/13/2017); and pace maker revision (12/13/2017). Mark [name] has a current medication list which includes the following prescription(s): aspirin, atorvastatin, cetirizine, vitamin d, clindamycin, cranberry, desonide, diphenhydramine, docusate sodium, fluoxetine, fluoxetine, fluticasone, furosemide, garlic, melatonin, multiple vitamin, naloxone, omega-3, omeprazole, oxycodone hcl, oxycodone hcl, oxycodone hcl, potassium citrate, ropinirole, saw palmetto (serenoa repens), sildenafil, vitamin b complex-c, and vitamin e. He  reports that he has never smoked. He has never used smokeless tobacco. He reports that he does not drink alcohol or use drugs. Mark [name] is allergic to ciprofloxacin; metformin; buprenorphine hcl; morphine and related; and penicillins.   HPI  Today, he is being contacted for medication management.  The patient indicates doing well with the current medication regimen. No adverse reactions or side effects reported to the medications.   Pharmacotherapy Assessment  Analgesic: Oxycodone IR 10 mg, 1 tab PO q 6 hrs (40 mg/day total of oxycodone) MME/day:60 mg/day.   Monitoring: Pharmacotherapy: No side-effects or adverse reactions reported. [place] PMP: PDMP reviewed during this encounter.       Compliance: No problems identified. Effectiveness: Clinically acceptable. Plan: Refer to "POC".  UDS:  Summary  Date Value Ref Range Status  08/15/2018 FINAL   Final    Comment:    ==================================================================== TOXASSURE SELECT 13 (MW) ==================================================================== Test  Result       Flag       Units Drug Present and Declared for Prescription Verification   Oxycodone                      1723         EXPECTED   ng/mg creat   Noroxycodone                   3560         EXPECTED   ng/mg creat    Sources of oxycodone include scheduled prescription medications.    Noroxycodone is an expected metabolite of oxycodone. ==================================================================== Test                      Result    Flag   Units      Ref Range   Creatinine              40               mg/dL      >=26 ==================================================================== Declared Medications:  The flagging and interpretation on this report are based on the  following declared medications.  Unexpected results may arise from  inaccuracies in the declared medications.  **Note: The testing scope of this panel includes these medications:  Oxycodone  **Note: The testing scope of this panel does not include following  reported medications:  Aspirin  Atorvastatin (Lipitor)  Cetirizine (Zyrtec)  Cranberry Extract  Desonide  Docusate  Fluoxetine  Fluticasone  Furosemide  Multivitamin  Naloxone  Naproxen  Omega-3 Fatty Acids  Omeprazole  Potassium Citrate  Ropinirole  Sildenafil  Supplement (Saw Palmetto)  Vitamin B  Vitamin D  Vitamin E ==================================================================== For clinical consultation, please call [phone]. ====================================================================    Laboratory Chemistry Profile (12 mo)  Renal: 08/09/2018: BUN 24; Creatinine, Ser 0.98  Lab Results  Component Value Date   GFR 82.48 08/09/2018   GFRAA >60 12/06/2017   GFRNONAA >60 12/06/2017   Hepatic:  08/09/2018: Albumin 3.9 Lab Results  Component Value Date   AST 23 08/09/2018   ALT 26 08/09/2018   Other: No results found for requested labs within last 8760 hours. Note: Above Lab results reviewed.  Imaging  Last 90 days:  No results found.  Assessment  The primary encounter diagnosis was Chronic pain syndrome. 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  Plan of Care  I am having Mark [name]. Mark [name] start on Oxycodone HCl and Oxycodone HCl. I am also having him maintain his cetirizine, aspirin, Multiple Vitamin (MULTIVITAMIN PO), Garlic, naloxone, Vitamin B Complex-C, CRANBERRY PO, Vitamin D, (Saw Palmetto, Serenoa repens, (SAW PALMETTO PO)), vitamin E, docusate sodium, Omega-3, desonide, diphenhydrAMINE, clindamycin, sildenafil, potassium citrate, furosemide, fluticasone, rOPINIRole, FLUoxetine, FLUoxetine, atorvastatin, omeprazole, Melatonin, and Oxycodone HCl.  Pharmacotherapy (Medications Ordered): Meds ordered this encounter  Medications  . Oxycodone HCl 10 MG TABS    Sig: Take 1 tablet (10 mg total) by mouth every 6 (six) hours as needed. Must last 30 days    Dispense:  120 tablet    Refill:  0    Chronic Pain: STOP Act (Not applicable) Fill 1 day early if closed on refill date. Do not fill until: 06/14/2019. To last until: 07/14/2019. Avoid benzodiazepines within 8 hours of opioids  . Oxycodone HCl 10 MG TABS  Sig: Take 1 tablet (10 mg total) by mouth every 6 (six) hours as needed. Must last 30 days    Dispense:  120 tablet    Refill:  0    Chronic Pain: STOP Act (Not applicable) Fill 1 day early if closed on refill date. Do not fill until: 07/14/2019. To last until: 08/13/2019. Avoid benzodiazepines within 8 hours of opioids  . Oxycodone HCl 10 MG TABS    Sig: Take 1 tablet (10 mg total) by mouth every 6 (six) hours as needed. Must last 30 days    Dispense:   120 tablet    Refill:  0    Chronic Pain: STOP Act (Not applicable) Fill 1 day early if closed on refill date. Do not fill until: 08/13/2019. To last until: 09/12/2019. Avoid benzodiazepines within 8 hours of opioids   Orders:  No orders of the defined types were placed in this encounter.  Follow-up plan:   Return in about 13 weeks (around 09/10/2019) for (VV), (MM).      Considering:  Diagnostic right hand digit 2 trigger finger injection Therapeutic bilateral IA Hyalgan knee injections  Diagnostic bilateral cervical facet block  Possible bilateral cervical facet RFA  Diagnostic left sided CESI  Diagnostic bilateral IA knee injection Diagnostic bilateral genicular NB Possible bilateral genicular nerve RFA  Diagnostic bilateral sacroiliac joint block Possible bilateral sacroiliac joint RFA  Diagnostic bilateral IA shoulder jointinjection  Diagnostic bilateral suprascapular NB Possible bilateral suprascapular nerve RFA  Diagnostic bilateral lumbar facet block Possible bilateral lumbar facet RFA  Diagnostic left L4-5 LESI    Palliative PRN treatment(s):  Bilateral intra-articular Hyalgan knee injectionseries (injection #4)  Diagnostic bilateral cervical facetblock  Diagnostic left sided CESI  Diagnostic bilateral IA knee injection Diagnostic bilateral genicular NB Diagnostic bilateral sacroiliac joint block Diagnostic bilateral IA shoulder joint injection  Diagnostic bilateral suprascapularNB  Diagnostic bilateral lumbar facetblock  Diagnostic left L4-5 LESI     Recent Visits Date Type Provider Dept  03/24/19 Office Visit Delano MetzNaveira, Vasti Yagi, MD [facility]  Showing recent visits within past 90 days and meeting all other requirements   Today's Visits Date Type Provider Dept  06/11/19 Telemedicine Delano MetzNaveira, Yeva Bissette, MD [facility]  Showing today's visits and meeting all other requirements   Future Appointments No visits were found  meeting these conditions.  Showing future appointments within next 90 days and meeting all other requirements   I discussed the assessment and treatment plan with the patient. The patient was provided an opportunity to ask questions and all were answered. The patient agreed with the plan and demonstrated an understanding of the instructions.  Patient advised to call back or seek an in-person evaluation if the symptoms or condition worsens.  Total duration of non-face-to-face encounter: 15 minutes.  Note by: Oswaldo DoneFrancisco A Evaan Tidwell, MD Date: 06/11/2019; Time: 2:53 PM  Note: This dictation was prepared with Dragon dictation. Any transcriptional errors that may result from this process are unintentional.  Disclaimer:  * Given the special circumstances of the COVID-19 pandemic, the federal government has announced that the Office for Civil Rights (OCR) will exercise its enforcement discretion and will not impose penalties on physicians using telehealth in the event of noncompliance with regulatory requirements under the [employer] Insurance Portability and Accountability Act (HIPAA) in connection with the good faith provision of telehealth during the COVID-19 national public health emergency. (AMA)

## 2019-08-13 ENCOUNTER — Other Ambulatory Visit: Payer: Self-pay

## 2019-08-13 ENCOUNTER — Other Ambulatory Visit: Payer: Self-pay | Admitting: Family Medicine

## 2019-08-13 ENCOUNTER — Other Ambulatory Visit (INDEPENDENT_AMBULATORY_CARE_PROVIDER_SITE_OTHER): Payer: Medicare Other

## 2019-08-13 DIAGNOSIS — D509 Iron deficiency anemia, unspecified: Secondary | ICD-10-CM

## 2019-08-13 DIAGNOSIS — R739 Hyperglycemia, unspecified: Secondary | ICD-10-CM

## 2019-08-13 DIAGNOSIS — E785 Hyperlipidemia, unspecified: Secondary | ICD-10-CM | POA: Diagnosis not present

## 2019-08-13 DIAGNOSIS — N401 Enlarged prostate with lower urinary tract symptoms: Secondary | ICD-10-CM

## 2019-08-13 DIAGNOSIS — N138 Other obstructive and reflux uropathy: Secondary | ICD-10-CM | POA: Diagnosis not present

## 2019-08-13 LAB — LIPID PANEL
Cholesterol: 112 mg/dL (ref 0–200)
HDL: 38.7 mg/dL — ABNORMAL LOW (ref >39.00)
LDL Cholesterol: 46 mg/dL (ref 0–99)
NonHDL: 73.75
Total CHOL/HDL Ratio: 3
Triglycerides: 139 mg/dL (ref 0.0–149.0)
VLDL: 27.8 mg/dL (ref 0.0–40.0)

## 2019-08-13 LAB — CBC WITH DIFFERENTIAL/PLATELET
Basophils Absolute: 0.1 10*3/uL (ref 0.0–0.1)
Basophils Relative: 1 % (ref 0.0–3.0)
Eosinophils Absolute: 0.3 10*3/uL (ref 0.0–0.7)
Eosinophils Relative: 3 % (ref 0.0–5.0)
HCT: 45.3 % (ref 39.0–52.0)
Hemoglobin: 15.8 g/dL (ref 13.0–17.0)
Lymphocytes Relative: 35.5 % (ref 12.0–46.0)
Lymphs Abs: 3.5 10*3/uL (ref 0.7–4.0)
MCHC: 34.8 g/dL (ref 30.0–36.0)
MCV: 87.8 fl (ref 78.0–100.0)
Monocytes Absolute: 1.2 10*3/uL — ABNORMAL HIGH (ref 0.1–1.0)
Monocytes Relative: 12.4 % — ABNORMAL HIGH (ref 3.0–12.0)
Neutro Abs: 4.7 10*3/uL (ref 1.4–7.7)
Neutrophils Relative %: 48.1 % (ref 43.0–77.0)
Platelets: 172 10*3/uL (ref 150.0–400.0)
RBC: 5.16 Mil/uL (ref 4.22–5.81)
RDW: 13.4 % (ref 11.5–15.5)
WBC: 9.9 10*3/uL (ref 4.0–10.5)

## 2019-08-13 LAB — COMPREHENSIVE METABOLIC PANEL
ALT: 29 U/L (ref 0–53)
AST: 39 U/L — ABNORMAL HIGH (ref 0–37)
Albumin: 4.3 g/dL (ref 3.5–5.2)
Alkaline Phosphatase: 65 U/L (ref 39–117)
BUN: 25 mg/dL — ABNORMAL HIGH (ref 6–23)
CO2: 29 mEq/L (ref 19–32)
Calcium: 9.3 mg/dL (ref 8.4–10.5)
Chloride: 101 mEq/L (ref 96–112)
Creatinine, Ser: 1.03 mg/dL (ref 0.40–1.50)
GFR: 73.03 mL/min (ref >60.00)
Glucose, Bld: 140 mg/dL — ABNORMAL HIGH (ref 70–99)
Potassium: 3.6 mEq/L (ref 3.5–5.1)
Sodium: 140 mEq/L (ref 135–145)
Total Bilirubin: 1.1 mg/dL (ref 0.2–1.2)
Total Protein: 6.5 g/dL (ref 6.0–8.3)

## 2019-08-13 LAB — FERRITIN: Ferritin: 177.8 ng/mL (ref 22.0–322.0)

## 2019-08-13 LAB — IBC PANEL
Iron: 135 ug/dL (ref 42–165)
Saturation Ratios: 47.5 % (ref 20.0–50.0)
Transferrin: 203 mg/dL — ABNORMAL LOW (ref 212.0–360.0)

## 2019-08-13 LAB — HEMOGLOBIN A1C: Hgb A1c MFr Bld: 6.2 % (ref 4.6–6.5)

## 2019-08-13 LAB — PSA: PSA: 1.38 ng/mL (ref 0.10–4.00)

## 2019-08-20 ENCOUNTER — Other Ambulatory Visit: Payer: Self-pay

## 2019-08-20 ENCOUNTER — Encounter: Payer: Medicare Other | Admitting: Family Medicine

## 2019-08-20 ENCOUNTER — Ambulatory Visit: Payer: Medicare Other

## 2019-08-20 ENCOUNTER — Ambulatory Visit (INDEPENDENT_AMBULATORY_CARE_PROVIDER_SITE_OTHER): Payer: Medicare Other

## 2019-08-20 DIAGNOSIS — Z0000 Encounter for general adult medical examination without abnormal findings: Secondary | ICD-10-CM

## 2019-08-20 NOTE — Patient Instructions (Signed)
Mark [name] , Thank you for taking time to come for your Medicare Wellness Visit. I appreciate your ongoing commitment to your health goals. Please review the following plan we discussed and let me know if I can assist you in the future.   Screening recommendations/referrals: Colonoscopy: Cologuard completed 09/21/2017 Recommended yearly ophthalmology/optometry visit for glaucoma screening and checkup Recommended yearly dental visit for hygiene and checkup  Vaccinations: Influenza vaccine: Up to date, completed 06/16/2019 Pneumococcal vaccine: Completed series Tdap vaccine: Up to date, completed 11/19/2009 Shingles vaccine: discussed    Advanced directives: Advance directive discussed with you today. Even though you declined this today please call our office should you change your mind and we can give you the proper paperwork for you to fill out.  Conditions/risks identified: hyperlipidemia  Next appointment: 09/01/2019 @ 3 pm   Preventive Care 40-64 Years, Male Preventive care refers to lifestyle choices and visits with your health care provider that can promote health and wellness. What does preventive care include?  A yearly physical exam. This is also called an annual well check.  Dental exams once or twice a year.  Routine eye exams. Ask your health care provider how often you should have your eyes checked.  Personal lifestyle choices, including:  Daily care of your teeth and gums.  Regular physical activity.  Eating a healthy diet.  Avoiding tobacco and drug use.  Limiting alcohol use.  Practicing safe sex.  Taking low-dose aspirin every day starting at age 84. What happens during an annual well check? The services and screenings done by your health care provider during your annual well check will depend on your age, overall health, lifestyle risk factors, and family history of disease. Counseling  Your health care provider may ask you questions about your:  Alcohol  use.  Tobacco use.  Drug use.  Emotional well-being.  Home and relationship well-being.  Sexual activity.  Eating habits.  Work and work Astronomer. Screening  You may have the following tests or measurements:  Height, weight, and BMI.  Blood pressure.  Lipid and cholesterol levels. These may be checked every 5 years, or more frequently if you are over 72 years old.  Skin check.  Lung cancer screening. You may have this screening every year starting at age 31 if you have a 30-pack-year history of smoking and currently smoke or have quit within the past 15 years.  Fecal occult blood test (FOBT) of the stool. You may have this test every year starting at age 87.  Flexible sigmoidoscopy or colonoscopy. You may have a sigmoidoscopy every 5 years or a colonoscopy every 10 years starting at age 54.  Prostate cancer screening. Recommendations will vary depending on your family history and other risks.  Hepatitis C blood test.  Hepatitis B blood test.  Sexually transmitted disease (STD) testing.  Diabetes screening. This is done by checking your blood sugar (glucose) after you have not eaten for a while (fasting). You may have this done every 1-3 years. Discuss your test results, treatment options, and if necessary, the need for more tests with your health care provider. Vaccines  Your health care provider may recommend certain vaccines, such as:  Influenza vaccine. This is recommended every year.  Tetanus, diphtheria, and acellular pertussis (Tdap, Td) vaccine. You may need a Td booster every 10 years.  Zoster vaccine. You may need this after age 51.  Pneumococcal 13-valent conjugate (PCV13) vaccine. You may need this if you have certain conditions and have not been  vaccinated.  Pneumococcal polysaccharide (PPSV23) vaccine. You may need one or two doses if you smoke cigarettes or if you have certain conditions. Talk to your health care provider about which screenings  and vaccines you need and how often you need them. This information is not intended to replace advice given to you by your health care provider. Make sure you discuss any questions you have with your health care provider. Document Released: 08/20/2015 Document Revised: 04/12/2016 Document Reviewed: 05/25/2015 Elsevier Interactive Patient Education  2017 [place] Prevention in the Home Falls can cause injuries. They can happen to people of all ages. There are many things you can do to make your home safe and to help prevent falls. What can I do on the outside of my home?  Regularly fix the edges of walkways and driveways and fix any cracks.  Remove anything that might make you trip as you walk through a door, such as a raised step or threshold.  Trim any bushes or trees on the path to your home.  Use bright outdoor lighting.  Clear any walking paths of anything that might make someone trip, such as rocks or tools.  Regularly check to see if handrails are loose or broken. Make sure that both sides of any steps have handrails.  Any raised decks and porches should have guardrails on the edges.  Have any leaves, snow, or ice cleared regularly.  Use sand or salt on walking paths during winter.  Clean up any spills in your garage right away. This includes oil or grease spills. What can I do in the bathroom?  Use night lights.  Install grab bars by the toilet and in the tub and shower. Do not use towel bars as grab bars.  Use non-skid mats or decals in the tub or shower.  If you need to sit down in the shower, use a plastic, non-slip stool.  Keep the floor dry. Clean up any water that spills on the floor as soon as it happens.  Remove soap buildup in the tub or shower regularly.  Attach bath mats securely with double-sided non-slip rug tape.  Do not have throw rugs and other things on the floor that can make you trip. What can I do in the bedroom?  Use night  lights.  Make sure that you have a light by your bed that is easy to reach.  Do not use any sheets or blankets that are too big for your bed. They should not hang down onto the floor.  Have a firm chair that has side arms. You can use this for support while you get dressed.  Do not have throw rugs and other things on the floor that can make you trip. What can I do in the kitchen?  Clean up any spills right away.  Avoid walking on wet floors.  Keep items that you use a lot in easy-to-reach places.  If you need to reach something above you, use a strong step stool that has a grab bar.  Keep electrical cords out of the way.  Do not use floor polish or wax that makes floors slippery. If you must use wax, use non-skid floor wax.  Do not have throw rugs and other things on the floor that can make you trip. What can I do with my stairs?  Do not leave any items on the stairs.  Make sure that there are handrails on both sides of the stairs and use them. Fix  handrails that are broken or loose. Make sure that handrails are as long as the stairways.  Check any carpeting to make sure that it is firmly attached to the stairs. Fix any carpet that is loose or worn.  Avoid having throw rugs at the top or bottom of the stairs. If you do have throw rugs, attach them to the floor with carpet tape.  Make sure that you have a light switch at the top of the stairs and the bottom of the stairs. If you do not have them, ask someone to add them for you. What else can I do to help prevent falls?  Wear shoes that:  Do not have high heels.  Have rubber bottoms.  Are comfortable and fit you well.  Are closed at the toe. Do not wear sandals.  If you use a stepladder:  Make sure that it is fully opened. Do not climb a closed stepladder.  Make sure that both sides of the stepladder are locked into place.  Ask someone to hold it for you, if possible.  Clearly mark and make sure that you can  see:  Any grab bars or handrails.  First and last steps.  Where the edge of each step is.  Use tools that help you move around (mobility aids) if they are needed. These include:  Canes.  Walkers.  Scooters.  Crutches.  Turn on the lights when you go into a dark area. Replace any light bulbs as soon as they burn out.  Set up your furniture so you have a clear path. Avoid moving your furniture around.  If any of your floors are uneven, fix them.  If there are any pets around you, be aware of where they are.  Review your medicines with your doctor. Some medicines can make you feel dizzy. This can increase your chance of falling. Ask your doctor what other things that you can do to help prevent falls. This information is not intended to replace advice given to you by your health care provider. Make sure you discuss any questions you have with your health care provider. Document Released: 05/20/2009 Document Revised: 12/30/2015 Document Reviewed: 08/28/2014 Elsevier Interactive Patient Education  2017 [employer].

## 2019-08-20 NOTE — Progress Notes (Signed)
PCP notes:  Health Maintenance: No gaps noted    Abnormal Screenings: none   Patient concerns: none   Nurse concerns: none   Next PCP appt.: 09/01/2019 @ 3 pm

## 2019-08-20 NOTE — Progress Notes (Signed)
Subjective:   Mark [name] is a 64 y.o. male who presents for Medicare Annual/Subsequent preventive examination.  Review of Systems: N/A   This visit is being conducted through telemedicine via telephone at the nurse health advisor's home address due to the COVID-19 pandemic. This patient has given me verbal consent via doximity to conduct this visit, patient states they are participating from their home address. Patient and myself are on the telephone call. There is no referral for this visit. Some vital signs may be absent or patient reported.    Patient identification: identified by name, DOB, and current address   Cardiac Risk Factors include: advanced age (>40men, >60 women);male gender;dyslipidemia     Objective:    Vitals: There were no vitals taken for this visit.  There is no height or weight on file to calculate BMI.  Advanced Directives 08/20/2019 08/16/2018 12/13/2017 12/06/2017 08/13/2017 05/09/2017 02/06/2017  Does Patient Have a Medical Advance Directive? No No No No No No No  Type of Advance Directive - - - - - - -  Does patient want to make changes to medical advance directive? - - - - - - -  Copy of Healthcare Power of Attorney in Chart? - - - - - - -  Would patient like information on creating a medical advance directive? Yes (MAU/Ambulatory/Procedural Areas - Information given) No - Patient declined No - Patient declined No - Patient declined No - Patient declined - -    Tobacco Social History   Tobacco Use  Smoking Status Never Smoker  Smokeless Tobacco Never Used     Counseling given: Not Answered   Clinical Intake:  Pre-visit preparation completed: Yes  Pain : 0-10 Pain Score: 3  Pain Type: Chronic pain Pain Location: Hand Pain Orientation: Left, Right Pain Descriptors / Indicators: Aching Pain Onset: More than a month ago Pain Frequency: Intermittent     Nutritional Risks: None Diabetes: No  How often do you need to have someone help you when  you read instructions, pamphlets, or other written materials from your doctor or pharmacy?: 1 - Never What is the last grade level you completed in school?: 8th  Interpreter Needed?: No  Information entered by :: CJohnson, LPN  Past Medical History:  Diagnosis Date  . Anxiety   . Arthritis   . Chronic pain   . Depression   . Heart murmur   . History of kidney stones April 12,2017   removed two kidney stones about 1 cm in size  . History of permanent cardiac pacemaker placement 05/31/2015  . Hydrocele in adult 05/07/2015  . Kidney stones   . Migraine   . MRSA carrier 2019  . Presence of permanent cardiac pacemaker   . Prostatitis   . Sleep apnea   . Sleep apnea    getting new cpap machine   Past Surgical History:  Procedure Laterality Date  . CARDIOVASCULAR STRESS TEST  2014   Dr Lady Gary  . CARPAL TUNNEL RELEASE Bilateral 2006  . CERVICAL SPINE SURGERY  2008   C5/6  . COLONOSCOPY  07/2007   prostate nodule, hemorrhoid, rpt 5 yrs Mechele Collin)  . IMPLANTABLE CARDIOVERTER DEFIBRILLATOR (ICD) GENERATOR CHANGE Left 12/13/2017   Procedure: PACER CHANGE OUT;  Surgeon: Marcina Millard, MD;  Location: [facility] ORS;  Service: Cardiovascular;  Laterality: Left;  . LITHOTRIPSY Bilateral 2012   Stoioff  . LITHOTRIPSY Left 11/2015   x2 Stoioff  . pace maker revision  12/13/2017  . PACEMAKER PLACEMENT  2009  s/p Medtronic Adapta DR Pacemaker (Dr. Lady Gary)  . ROTATOR CUFF REPAIR Left 2009  . TONSILLECTOMY  1964  . ULNAR NERVE REPAIR  2008   Left   Family History  Problem Relation Age of Onset  . Heart disease Mother   . Diabetes Mother   . Arthritis Mother   . Cancer Mother        Breast Cancer  . Heart block Father   . Heart disease Father   . Arthritis Father   . Cancer Father        skin cancer   Social History   Socioeconomic History  . Marital status: Divorced    Spouse name: Not on file  . Number of children: Not on file  . Years of education: Not on file  . Highest  education level: Not on file  Occupational History  . Not on file  Tobacco Use  . Smoking status: Never Smoker  . Smokeless tobacco: Never Used  Substance and Sexual Activity  . Alcohol use: No  . Drug use: No  . Sexual activity: Never  Other Topics Concern  . Not on file  Social History Narrative   Divorced. Lives with wife.   Edu: 8th grade   Occ: retired, disability for chronic neck pain    Activity: no regular exercise - limited by back and knee pain   Diet: poor water, lots of tang and gatorade, no vegetables or fruits   Social Determinants of Health   Financial Resource Strain: Low Risk   . Difficulty of Paying Living Expenses: Not hard at all  Food Insecurity: No Food Insecurity  . Worried About Programme researcher, broadcasting/film/video in the Last Year: Never true  . Ran Out of Food in the Last Year: Never true  Transportation Needs: No Transportation Needs  . Lack of Transportation (Medical): No  . Lack of Transportation (Non-Medical): No  Physical Activity: Inactive  . Days of Exercise per Week: 0 days  . Minutes of Exercise per Session: 0 min  Stress: No Stress Concern Present  . Feeling of Stress : Not at all  Social Connections:   . Frequency of Communication with Friends and Family: Not on file  . Frequency of Social Gatherings with Friends and Family: Not on file  . Attends Religious Services: Not on file  . Active Member of Clubs or Organizations: Not on file  . Attends Banker Meetings: Not on file  . Marital Status: Not on file    Outpatient Encounter Medications as of 08/20/2019  Medication Sig  . aspirin 81 MG tablet Take 81 mg by mouth daily.    Marland Kitchen atorvastatin (LIPITOR) 20 MG tablet Take 1 tablet (20 mg total) by mouth at bedtime.  . cetirizine (ZYRTEC) 10 MG tablet Take 10 mg by mouth at bedtime.   . Cholecalciferol (VITAMIN D) 2000 units CAPS Take 2,000 Units by mouth daily.   . clindamycin (CLEOCIN) 150 MG capsule Take by mouth 3 (three) times daily.  Marland Kitchen  CRANBERRY PO Take 4,200 mg by mouth at bedtime.   Marland Kitchen desonide (DESOWEN) 0.05 % ointment Apply 1 application topically 2 (two) times daily as needed (for rash on chin due to dryness).  . diphenhydrAMINE (BENADRYL) 50 MG capsule Take 50 mg by mouth at bedtime.  . docusate sodium (COLACE) 100 MG capsule Take 100 mg by mouth 2 (two) times daily as needed.   Marland Kitchen FLUoxetine (PROZAC) 20 MG capsule Take 1 capsule (20 mg total) by mouth  daily.  Marland Kitchen FLUoxetine (PROZAC) 40 MG capsule Take 1 capsule (40 mg total) by mouth daily.  . fluticasone (FLONASE) 50 MCG/ACT nasal spray Place 2 sprays into both nostrils daily.  . furosemide (LASIX) 20 MG tablet TAKE 1 BY MOUTH DAILY  . Garlic 1000 MG CAPS Take 1,000 mg by mouth daily.   . Melatonin 10 MG TABS Take 10 mg by mouth at bedtime as needed.  . Multiple Vitamin (MULTIVITAMIN PO) Take 1 tablet by mouth daily.    . Naloxone HCl (NARCAN) 4 MG/0.1ML LIQD Spray into one nostril. Repeat with second device into other nostril after 2-3 minutes if no or minimal response.  . Omega-3 1000 MG CAPS Take 1,000 mg by mouth at bedtime.  Marland Kitchen omeprazole (PRILOSEC) 20 MG capsule TAKE 1 BY MOUTH DAILY (Patient taking differently: Take 20 mg by mouth as needed. TAKE 1 BY MOUTH DAILY)  . Oxycodone HCl 10 MG TABS Take 1 tablet (10 mg total) by mouth every 6 (six) hours as needed. Must last 30 days  . potassium citrate (UROCIT-K) 10 MEQ (1080 MG) SR tablet Take 1 tablet (10 mEq total) by mouth 2 (two) times daily.  Marland Kitchen rOPINIRole (REQUIP) 0.5 MG tablet Take 1 tablet (0.5 mg total) by mouth at bedtime.  . Saw Palmetto, Serenoa repens, (SAW PALMETTO PO) Take 450 mg by mouth 2 (two) times daily.   . sildenafil (REVATIO) 20 MG tablet Take 3-5 tablets (60-100 mg total) by mouth daily as needed (for erectile dysfunction.).  Marland Kitchen VITAMIN B COMPLEX-C CAPS Take by mouth daily.   . vitamin E 1000 UNIT capsule Take 1,000 Units by mouth daily.   . Oxycodone HCl 10 MG TABS Take 1 tablet (10 mg total) by  mouth every 6 (six) hours as needed. Must last 30 days  . Oxycodone HCl 10 MG TABS Take 1 tablet (10 mg total) by mouth every 6 (six) hours as needed. Must last 30 days   No facility-administered encounter medications on file as of 08/20/2019.    Activities of Daily Living In your present state of health, do you have any difficulty performing the following activities: 08/20/2019  Hearing? N  Vision? N  Difficulty concentrating or making decisions? N  Walking or climbing stairs? N  Dressing or bathing? N  Doing errands, shopping? N  Preparing Food and eating ? N  Using the Toilet? N  In the past six months, have you accidently leaked urine? N  Do you have problems with loss of bowel control? N  Managing your Medications? N  Managing your Finances? N  Housekeeping or managing your Housekeeping? N  Some recent data might be hidden    Patient Care Team: [name], MD as PCP - General (Family Medicine) Jerelyn Scott, DDS, PA as Referring Physician (Dentistry) [name], MD as Referring Physician (Pain Medicine) Dalia Heading, MD as Consulting Physician (Cardiology) Shane Crutch, MD as Consulting Physician (Pulmonary Disease) [name], MD (Urology) Dewitt Rota, OD as Referring Physician (Optometry)   Assessment:   This is a routine wellness examination for [place].  Exercise Activities and Dietary recommendations Current Exercise Habits: The patient does not participate in regular exercise at present, Exercise limited by: None identified  Goals    . Follow up with Primary Care Provider     Starting 08/16/2018, I will continue to take medications as prescribed and to keep appointments with PCP as scheduled.     . Patient Stated     08/20/2019, I  will maintain and continue medications as prescribed.       Fall Risk Fall Risk  08/20/2019 09/10/2018 08/16/2018 08/15/2018 05/14/2018  Falls in the past year? 0 0 0 0 No  Number falls in past yr: 0 - -  - -  Injury with Fall? 0 0 - - -  Risk for fall due to : Medication side effect - - - -  Follow up Falls evaluation completed;Falls prevention discussed - - - -   Is the patient's home free of loose throw rugs in walkways, pet beds, electrical cords, etc?   yes      Grab bars in the bathroom? yes      Handrails on the stairs?   yes      Adequate lighting?   yes  Timed Get Up and Go Performed: N/A  Depression Screen PHQ 2/9 Scores 08/20/2019 08/16/2018 05/14/2018 02/11/2018  PHQ - 2 Score 0 0 1 1  PHQ- 9 Score 0 0 - 6  Exception Documentation - - - -    Cognitive Function MMSE - Mini Mental State Exam 08/20/2019 08/16/2018 08/13/2017 08/04/2016  Orientation to time 5 5 5 5   Orientation to Place 5 5 5 5   Registration 3 3 3 3   Attention/ Calculation 0 0 0 0  Attention/Calculation-comments can't spell - - -  Recall 3 2 3 1   Recall-comments - unable to recall 1 of 3 words - pt was unable to recall 2 of 3 words  Language- name 2 objects - 0 0 0  Language- repeat 1 1 1 1   Language- follow 3 step command - 3 3 3   Language- read & follow direction - 0 0 0  Write a sentence - 0 0 0  Copy design - 0 0 0  Total score - 19 20 18   Mini Cog  Mini-Cog screen was completed. Maximum score is 22. A value of 0 denotes this part of the MMSE was not completed or the patient failed this part of the Mini-Cog screening.       Immunization History  Administered Date(s) Administered  . Influenza,inj,Quad PF,6+ Mos 04/22/2013, 04/28/2014, 05/07/2015, 04/11/2016, 05/22/2017, 05/16/2018  . Influenza-Unspecified 06/16/2019  . Pneumococcal Polysaccharide-23 05/30/2006  . Tdap 11/19/2009    Qualifies for Shingles Vaccine? Yes  Screening Tests Health Maintenance  Topic Date Due  . DTAP VACCINES (1) 03/07/1956  . DTaP/Tdap/Td (2 - Td) 11/20/2019  . TETANUS/TDAP  11/20/2019  . Fecal DNA (Cologuard)  09/21/2020  . INFLUENZA VACCINE  Completed  . Hepatitis C Screening  Completed  . HIV Screening   Completed   Cancer Screenings: Lung: Low Dose CT Chest recommended if Age 64-80 years, 30 pack-year currently smoking OR have quit w/in 15 years. Patient does not qualify. Colorectal: Cologuard completed 09/21/2017  Additional Screenings:  Hepatitis C Screening: 07/22/2013      Plan:    Patient will maintain and continue medications as prescribed.  I have personally reviewed and noted the following in the patient's chart:   . Medical and social history . Use of alcohol, tobacco or illicit drugs  . Current medications and supplements . Functional ability and status . Nutritional status . Physical activity . Advanced directives . List of other physicians . Hospitalizations, surgeries, and ER visits in previous 12 months . Vitals . Screenings to include cognitive, depression, and falls . Referrals and appointments  In addition, I have reviewed and discussed with patient certain preventive protocols, quality metrics, and best practice recommendations. A written  personalized care plan for preventive services as well as general preventive health recommendations were provided to patient.     Janalyn ShyJohnson, Yadir Zentner, LPN  0/98/11911/13/2021

## 2019-09-01 ENCOUNTER — Encounter: Payer: Self-pay | Admitting: Family Medicine

## 2019-09-01 ENCOUNTER — Other Ambulatory Visit: Payer: Self-pay

## 2019-09-01 ENCOUNTER — Ambulatory Visit (INDEPENDENT_AMBULATORY_CARE_PROVIDER_SITE_OTHER): Payer: Medicare Other | Admitting: Family Medicine

## 2019-09-01 VITALS — BP 130/70 | HR 80 | Temp 97.8°F | Ht 67.0 in | Wt 226.4 lb

## 2019-09-01 DIAGNOSIS — F331 Major depressive disorder, recurrent, moderate: Secondary | ICD-10-CM

## 2019-09-01 DIAGNOSIS — E785 Hyperlipidemia, unspecified: Secondary | ICD-10-CM

## 2019-09-01 DIAGNOSIS — R195 Other fecal abnormalities: Secondary | ICD-10-CM | POA: Diagnosis not present

## 2019-09-01 DIAGNOSIS — R011 Cardiac murmur, unspecified: Secondary | ICD-10-CM

## 2019-09-01 DIAGNOSIS — Z7189 Other specified counseling: Secondary | ICD-10-CM | POA: Diagnosis not present

## 2019-09-01 DIAGNOSIS — Z0000 Encounter for general adult medical examination without abnormal findings: Secondary | ICD-10-CM | POA: Diagnosis not present

## 2019-09-01 DIAGNOSIS — G2581 Restless legs syndrome: Secondary | ICD-10-CM

## 2019-09-01 MED ORDER — ATORVASTATIN CALCIUM 20 MG PO TABS: 20.0000 mg | ORAL_TABLET | Freq: Every day | ORAL | 4 refills | Status: DC

## 2019-09-01 MED ORDER — OMEPRAZOLE 20 MG PO CPDR: 20.0000 mg | DELAYED_RELEASE_CAPSULE | Freq: Every day | ORAL | 4 refills | Status: DC | PRN

## 2019-09-01 MED ORDER — FLUOXETINE HCL 40 MG PO CAPS: 40.0000 mg | ORAL_CAPSULE | Freq: Every day | ORAL | 4 refills | Status: DC

## 2019-09-01 MED ORDER — FLUTICASONE PROPIONATE 50 MCG/ACT NA SUSP: 2.0000 | Freq: Every day | NASAL | 4 refills | Status: DC

## 2019-09-01 MED ORDER — FUROSEMIDE 20 MG PO TABS: 20.0000 mg | ORAL_TABLET | Freq: Every day | ORAL | 4 refills | Status: DC

## 2019-09-01 MED ORDER — ROPINIROLE HCL 0.5 MG PO TABS: 0.5000 mg | ORAL_TABLET | Freq: Every day | ORAL | 4 refills | Status: DC

## 2019-09-01 MED ORDER — CYCLOBENZAPRINE HCL 10 MG PO TABS: 10.0000 mg | ORAL_TABLET | Freq: Two times a day (BID) | ORAL | 0 refills | Status: DC | PRN

## 2019-09-01 MED ORDER — ROPINIROLE HCL 1 MG PO TABS: 1.0000 mg | ORAL_TABLET | Freq: Every day | ORAL | 4 refills | Status: DC

## 2019-09-01 MED ORDER — FLUOXETINE HCL 20 MG PO CAPS: 20.0000 mg | ORAL_CAPSULE | Freq: Every day | ORAL | 4 refills | Status: DC

## 2019-09-01 NOTE — Assessment & Plan Note (Signed)
Advanced directive discussion -has this at home -HCPOA would be Susan Ray (lifelong friend).Asked to bring us a copy. 

## 2019-09-01 NOTE — Assessment & Plan Note (Signed)
Will refer to [facility] per pt preference - overdue for f/u.

## 2019-09-01 NOTE — Progress Notes (Signed)
This visit was conducted in person.  BP 130/70 (BP Location: Left Arm, Patient Position: Sitting, Cuff Size: Large)   Pulse 80   Temp 97.8 F (36.6 C) (Temporal)   Ht 5\' 7"  (1.702 m)   Wt 226 lb 6 oz (102.7 kg)   SpO2 95%   BMI 35.46 kg/m    CC: CPE Subjective:    Patient ID: Mark [name], male    DOB: 05/25/1956, 64 y.o.   MRN: [id number]  HPI: Mark HansonDennis K [name] is a 64 y.o. male presenting on 09/01/2019 for Annual Exam (Prt 2. )   Saw health advisor last week for medicare wellness visit. Note reviewed.    No exam data present    [facility]  PHQ-2 Total Score  0      Fall Risk  08/20/2019 09/10/2018 08/16/2018 08/15/2018 05/14/2018  Falls in the past year? 0 0 0 0 No  Number falls in past yr: 0 - - - -  Injury with Fall? 0 0 - - -  Risk for fall due to : Medication side effect - - - -  Follow up Falls evaluation completed;Falls prevention discussed - - - -     Notes requip losing effect.   Preventative: COLONOSCOPY 07/2007 prostate nodule, hemorrhoid, rpt 5 yrs Mark [name]([facility]). Cologuard positive 08/2017 - colonoscopy postponed 2020.  Prostate cancer screeningwith nodule-followed by urology Dr Lonna CobbStoioff.Upcoming appt next month.  Lung cancer screening -not indicated Flu shotyearly Pneumovax 2007 Tdap 11/2009 shingrix - discussed  Advanced directive discussion - has this at home - HCPOA would be Mark BerkshireSusan [name] (lifelong friend).Asked to bring us a copy.  Seat belt use discussed  Sunscreen use discussed, no changing moles on skin. Non smoker  Alcohol - none Dentist q6 mo  Eye exam intermittently   Divorced. Lives with wife. Edu: 8th grade Occ: retired, disability for chronic neck pain  Activity: no regular exercise - limited by back and knee pain Diet: increasing water, poorvegetablesandfruits     Relevant past medical, surgical, family and social history reviewed and updated as indicated. Interim medical  history since our last visit reviewed. Allergies and medications reviewed and updated. Outpatient Medications Prior to Visit  Medication Sig Dispense Refill  . aspirin 81 MG tablet Take 81 mg by mouth daily.      . cetirizine (ZYRTEC) 10 MG tablet Take 10 mg by mouth at bedtime.     . Cholecalciferol (VITAMIN D) 2000 units CAPS Take 2,000 Units by mouth daily.     . clindamycin (CLEOCIN) 150 MG capsule Take by mouth 3 (three) times daily.    Marland Kitchen. CRANBERRY PO Take 4,200 mg by mouth at bedtime.     Marland Kitchen. desonide (DESOWEN) 0.05 % ointment Apply 1 application topically 2 (two) times daily as needed (for rash on chin due to dryness). 15 g 0  . diphenhydrAMINE (BENADRYL) 50 MG capsule Take 50 mg by mouth at bedtime.    . docusate sodium (COLACE) 100 MG capsule Take 100 mg by mouth 2 (two) times daily as needed.     . Garlic 1000 MG CAPS Take 1,000 mg by mouth daily.     . Melatonin 10 MG TABS Take 10 mg by mouth at bedtime as needed.    . Multiple Vitamin (MULTIVITAMIN PO) Take 1 tablet by mouth daily.      . Naloxone HCl (NARCAN) 4 MG/0.1ML LIQD Spray into one nostril. Repeat with second device into other nostril  after 2-3 minutes if no or minimal response. 2 each 0  . NAPROXEN PO Take 500 mg by mouth as needed.    . Omega-3 1000 MG CAPS Take 1,000 mg by mouth at bedtime.    . Oxycodone HCl 10 MG TABS Take 1 tablet (10 mg total) by mouth every 6 (six) hours as needed. Must last 30 days 120 tablet 0  . potassium citrate (UROCIT-K) 10 MEQ (1080 MG) SR tablet Take 1 tablet (10 mEq total) by mouth 2 (two) times daily. 180 tablet 3  . Saw Palmetto, Serenoa repens, (SAW PALMETTO PO) Take 450 mg by mouth 2 (two) times daily.     . sildenafil (REVATIO) 20 MG tablet Take 3-5 tablets (60-100 mg total) by mouth daily as needed (for erectile dysfunction.). 30 tablet 3  . VITAMIN B COMPLEX-C CAPS Take by mouth daily.     . vitamin E 1000 UNIT capsule Take 1,000 Units by mouth daily.     Marland Kitchen atorvastatin (LIPITOR) 20  MG tablet Take 1 tablet (20 mg total) by mouth at bedtime. 90 tablet 3  . FLUoxetine (PROZAC) 20 MG capsule Take 1 capsule (20 mg total) by mouth daily. 90 capsule 3  . FLUoxetine (PROZAC) 40 MG capsule Take 1 capsule (40 mg total) by mouth daily. 90 capsule 3  . fluticasone (FLONASE) 50 MCG/ACT nasal spray Place 2 sprays into both nostrils daily. 48 g 3  . furosemide (LASIX) 20 MG tablet TAKE 1 BY MOUTH DAILY 90 tablet 3  . omeprazole (PRILOSEC) 20 MG capsule TAKE 1 BY MOUTH DAILY (Patient taking differently: Take 20 mg by mouth as needed. TAKE 1 BY MOUTH DAILY) 90 capsule 3  . rOPINIRole (REQUIP) 0.5 MG tablet Take 1 tablet (0.5 mg total) by mouth at bedtime. 90 tablet 3  . Oxycodone HCl 10 MG TABS Take 1 tablet (10 mg total) by mouth every 6 (six) hours as needed. Must last 30 days 120 tablet 0  . Oxycodone HCl 10 MG TABS Take 1 tablet (10 mg total) by mouth every 6 (six) hours as needed. Must last 30 days 120 tablet 0   No facility-administered medications prior to visit.     Per HPI unless specifically indicated in ROS section below Review of Systems  Constitutional: Negative for activity change, appetite change, chills, fatigue, fever and unexpected weight change.  HENT: Negative for hearing loss.   Eyes: Negative for visual disturbance.  Respiratory: Positive for shortness of breath (with exertion). Negative for cough, chest tightness and wheezing.   Cardiovascular: Negative for chest pain, palpitations and leg swelling.  Gastrointestinal: Negative for abdominal distention, abdominal pain, blood in stool, constipation, diarrhea, nausea and vomiting.  Genitourinary: Negative for difficulty urinating and hematuria.  Musculoskeletal: Negative for arthralgias, myalgias and neck pain.  Skin: Negative for rash.  Neurological: Negative for dizziness, seizures, syncope and headaches.  Hematological: Negative for adenopathy. Does not bruise/bleed easily.  Psychiatric/Behavioral: Negative for  dysphoric mood. The patient is not nervous/anxious.    Objective:    BP 130/70 (BP Location: Left Arm, Patient Position: Sitting, Cuff Size: Large)   Pulse 80   Temp 97.8 F (36.6 C) (Temporal)   Ht 5\' 7"  (1.702 m)   Wt 226 lb 6 oz (102.7 kg)   SpO2 95%   BMI 35.46 kg/m   Wt Readings from Last 3 Encounters:  09/01/19 226 lb 6 oz (102.7 kg)  09/10/18 235 lb 1.6 oz (106.6 kg)  08/16/18 233 lb 4 oz (105.8 kg)  Physical Exam Vitals and nursing note reviewed.  Constitutional:      General: He is not in acute distress.    Appearance: Normal appearance. He is well-developed. He is obese. He is not ill-appearing.  HENT:     Head: Normocephalic and atraumatic.     Right Ear: Hearing, tympanic membrane, ear canal and external ear normal.     Left Ear: Hearing, tympanic membrane, ear canal and external ear normal.     Mouth/Throat:     Pharynx: Uvula midline.  Eyes:     General: No scleral icterus.    Extraocular Movements: Extraocular movements intact.     Conjunctiva/sclera: Conjunctivae normal.     Pupils: Pupils are equal, round, and reactive to light.  Cardiovascular:     Rate and Rhythm: Normal rate and regular rhythm.     Pulses: Normal pulses.          Radial pulses are 2+ on the right side and 2+ on the left side.     Heart sounds: Murmur (1/6 systolic best at USB) present.  Pulmonary:     Effort: Pulmonary effort is normal. No respiratory distress.     Breath sounds: Normal breath sounds. No wheezing, rhonchi or rales.  Abdominal:     General: Abdomen is flat. Bowel sounds are normal. There is no distension.     Palpations: Abdomen is soft. There is no mass.     Tenderness: There is no abdominal tenderness. There is no guarding or rebound.     Hernia: No hernia is present.  Musculoskeletal:        General: Tenderness present. Normal range of motion.     Cervical back: Normal range of motion and neck supple.     Right lower leg: Edema (tr) present.     Left lower  leg: Edema (tr) present.     Comments: Point tenderness to palpation R mid heel sole, no significant pain at achilles or at retrocalcaneal bursa  Lymphadenopathy:     Cervical: No cervical adenopathy.  Skin:    General: Skin is warm and dry.     Findings: No rash.  Neurological:     General: No focal deficit present.     Mental Status: He is alert and oriented to person, place, and time.     Comments: CN grossly intact, station and gait intact  Psychiatric:        Mood and Affect: Mood normal.        Behavior: Behavior normal.        Thought Content: Thought content normal.        Judgment: Judgment normal.       Results for orders placed or performed in visit on 08/13/19  IBC panel  Result Value Ref Range   Iron 135 42 - 165 ug/dL   Transferrin 709.6 (L) 212.0 - 360.0 mg/dL   Saturation Ratios 28.3 20.0 - 50.0 %  Ferritin  Result Value Ref Range   Ferritin 177.8 22.0 - 322.0 ng/mL  CBC with Differential  Result Value Ref Range   WBC 9.9 4.0 - 10.5 K/uL   RBC 5.16 4.22 - 5.81 Mil/uL   Hemoglobin 15.8 13.0 - 17.0 g/dL   HCT 66.2 94.7 - 65.4 %   MCV 87.8 78.0 - 100.0 fl   MCHC 34.8 30.0 - 36.0 g/dL   RDW 65.0 35.4 - 65.6 %   Platelets 172.0 150.0 - 400.0 K/uL   Neutrophils Relative % 48.1 43.0 - 77.0 %  Lymphocytes Relative 35.5 12.0 - 46.0 %   Monocytes Relative 12.4 (H) 3.0 - 12.0 %   Eosinophils Relative 3.0 0.0 - 5.0 %   Basophils Relative 1.0 0.0 - 3.0 %   Neutro Abs 4.7 1.4 - 7.7 K/uL   Lymphs Abs 3.5 0.7 - 4.0 K/uL   Monocytes Absolute 1.2 (H) 0.1 - 1.0 K/uL   Eosinophils Absolute 0.3 0.0 - 0.7 K/uL   Basophils Absolute 0.1 0.0 - 0.1 K/uL  PSA  Result Value Ref Range   PSA 1.38 0.10 - 4.00 ng/mL  Hemoglobin A1c  Result Value Ref Range   Hgb A1c MFr Bld 6.2 4.6 - 6.5 %  Lipid panel  Result Value Ref Range   Cholesterol 112 0 - 200 mg/dL   Triglycerides 233.0 0.0 - 149.0 mg/dL   HDL 07.62 (L) >26.33 mg/dL   VLDL 35.4 0.0 - 56.2 mg/dL   LDL Cholesterol  46 0 - 99 mg/dL   Total CHOL/HDL Ratio 3    NonHDL 73.75   Comprehensive metabolic panel  Result Value Ref Range   Sodium 140 135 - 145 mEq/L   Potassium 3.6 3.5 - 5.1 mEq/L   Chloride 101 96 - 112 mEq/L   CO2 29 19 - 32 mEq/L   Glucose, Bld 140 (H) 70 - 99 mg/dL   BUN 25 (H) 6 - 23 mg/dL   Creatinine, Ser 5.63 0.40 - 1.50 mg/dL   Total Bilirubin 1.1 0.2 - 1.2 mg/dL   Alkaline Phosphatase 65 39 - 117 U/L   AST 39 (H) 0 - 37 U/L   ALT 29 0 - 53 U/L   Total Protein 6.5 6.0 - 8.3 g/dL   Albumin 4.3 3.5 - 5.2 g/dL   GFR 89.37 >34.28 mL/min   Calcium 9.3 8.4 - 10.5 mg/dL   Assessment & Plan:  This visit occurred during the SARS-CoV-2 public health emergency.  Safety protocols were in place, including screening questions prior to the visit, additional usage of staff PPE, and extensive cleaning of exam room while observing appropriate contact time as indicated for disinfecting solutions.   Problem List Items Addressed This Visit    Severe obesity (BMI 35.0-39.9) with comorbidity (HCC)    Congratulated on weight loss to date. Pt motivated to continue efforts.      RLS (restless legs syndrome)    Persistent RLS symptoms - trial higher requip dose.       Positive colorectal cancer screening using Cologuard test    Will refer to [facility] per pt preference - overdue for f/u.      Relevant Orders   Ambulatory referral to Gastroenterology   MDD (major depressive disorder), recurrent episode, moderate (HCC)    Stable period on current regimen - continue.       Relevant Medications   FLUoxetine (PROZAC) 20 MG capsule   FLUoxetine (PROZAC) 40 MG capsule   Hyperlipidemia    Chronic, stable. Continue lipitor. The ASCVD Risk score Denman George DC Jr., et al., 2013) failed to calculate for the following reasons:   The valid total cholesterol range is 130 to 320 mg/dL       Relevant Medications   furosemide (LASIX) 20 MG tablet   atorvastatin (LIPITOR) 20 MG tablet   Health maintenance  examination - Primary    Preventative protocols reviewed and updated unless pt declined. Discussed healthy diet and lifestyle.       Cardiac murmur    Mild systolic.       Advanced  care planning/counseling discussion    Advanced directive discussion - has this at home - HCPOA would be Mark [name] (lifelong friend).Asked to bring [place] a copy.           Meds ordered this encounter  Medications  . fluticasone (FLONASE) 50 MCG/ACT nasal spray    Sig: Place 2 sprays into both nostrils daily.    Dispense:  48 g    Refill:  4  . furosemide (LASIX) 20 MG tablet    Sig: Take 1 tablet (20 mg total) by mouth daily.    Dispense:  90 tablet    Refill:  4  . FLUoxetine (PROZAC) 20 MG capsule    Sig: Take 1 capsule (20 mg total) by mouth daily.    Dispense:  90 capsule    Refill:  4  . atorvastatin (LIPITOR) 20 MG tablet    Sig: Take 1 tablet (20 mg total) by mouth at bedtime.    Dispense:  90 tablet    Refill:  4  . omeprazole (PRILOSEC) 20 MG capsule    Sig: Take 1 capsule (20 mg total) by mouth daily as needed (GERD).    Dispense:  90 capsule    Refill:  4  . DISCONTD: rOPINIRole (REQUIP) 0.5 MG tablet    Sig: Take 1 tablet (0.5 mg total) by mouth at bedtime.    Dispense:  90 tablet    Refill:  4  . FLUoxetine (PROZAC) 40 MG capsule    Sig: Take 1 capsule (40 mg total) by mouth daily.    Dispense:  90 capsule    Refill:  4  . rOPINIRole (REQUIP) 1 MG tablet    Sig: Take 1 tablet (1 mg total) by mouth at bedtime.    Dispense:  90 tablet    Refill:  4    Note new sig  . cyclobenzaprine (FLEXERIL) 10 MG tablet    Sig: Take 1 tablet (10 mg total) by mouth 2 (two) times daily as needed for muscle spasms.    Dispense:  40 tablet    Refill:  0   Orders Placed This Encounter  Procedures  . Ambulatory referral to Gastroenterology    Referral Priority:   Routine    Referral Type:   Consultation    Referral Reason:   Specialty Services Required    Number of Visits Requested:   1     Patient instructions: If interested, check with pharmacy about new 2 shot shingles series (shingrix).  Bring [place] copy of your advanced directive.  I do think you have plantar fasciitis of right foot - treat with frozen water bottle massage, gentle stretching of foot, heel lift gel insert for shoes, anti inflammatories as needed. Let us know if not improving with treatment.   Follow up plan: Return in about 1 year (around 08/31/2020) for annual exam, prior fasting for blood work, medicare wellness visit.  [name], MD

## 2019-09-01 NOTE — Patient Instructions (Addendum)
If interested, check with pharmacy about new 2 shot shingles series (shingrix).  Bring [place] copy of your advanced directive.  I do think you have plantar fasciitis of right foot - treat with frozen water bottle massage, gentle stretching of foot, heel lift gel insert for shoes, anti inflammatories as needed. Let us know if not improving with treatment.   Health Maintenance, Male Adopting a healthy lifestyle and getting preventive care are important in promoting health and wellness. Ask your health care provider about:  The right schedule for you to have regular tests and exams.  Things you can do on your own to prevent diseases and keep yourself healthy. What should I know about diet, weight, and exercise? Eat a healthy diet   Eat a diet that includes plenty of vegetables, fruits, low-fat dairy products, and lean protein.  Do not eat a lot of foods that are high in solid fats, added sugars, or sodium. Maintain a healthy weight Body mass index (BMI) is a measurement that can be used to identify possible weight problems. It estimates body fat based on height and weight. Your health care provider can help determine your BMI and help you achieve or maintain a healthy weight. Get regular exercise Get regular exercise. This is one of the most important things you can do for your health. Most adults should:  Exercise for at least 150 minutes each week. The exercise should increase your heart rate and make you sweat (moderate-intensity exercise).  Do strengthening exercises at least twice a week. This is in addition to the moderate-intensity exercise.  Spend less time sitting. Even light physical activity can be beneficial. Watch cholesterol and blood lipids Have your blood tested for lipids and cholesterol at 63 years of age, then have this test every 5 years. You may need to have your cholesterol levels checked more often if:  Your lipid or cholesterol levels are high.  You are older than  63 years of age.  You are at high risk for heart disease. What should I know about cancer screening? Many types of cancers can be detected early and may often be prevented. Depending on your health history and family history, you may need to have cancer screening at various ages. This may include screening for:  Colorectal cancer.  Prostate cancer.  Skin cancer.  Lung cancer. What should I know about heart disease, diabetes, and high blood pressure? Blood pressure and heart disease  High blood pressure causes heart disease and increases the risk of stroke. This is more likely to develop in people who have high blood pressure readings, are of African descent, or are overweight.  Talk with your health care provider about your target blood pressure readings.  Have your blood pressure checked: ? Every 3-5 years if you are 13-1 years of age. ? Every year if you are 42 years old or older.  If you are between the ages of 48 and 75 and are a current or former smoker, ask your health care provider if you should have a one-time screening for abdominal aortic aneurysm (AAA). Diabetes Have regular diabetes screenings. This checks your fasting blood sugar level. Have the screening done:  Once every three years after age 21 if you are at a normal weight and have a low risk for diabetes.  More often and at a younger age if you are overweight or have a high risk for diabetes. What should I know about preventing infection? Hepatitis B If you have a higher risk  for hepatitis B, you should be screened for this virus. Talk with your health care provider to find out if you are at risk for hepatitis B infection. Hepatitis C Blood testing is recommended for:  Everyone born from [birth year] through 1964.  Anyone with known risk factors for hepatitis C. Sexually transmitted infections (STIs)  You should be screened each year for STIs, including gonorrhea and chlamydia, if: ? You are sexually active and  are younger than 63 years of age. ? You are older than 63 years of age and your health care provider tells you that you are at risk for this type of infection. ? Your sexual activity has changed since you were last screened, and you are at increased risk for chlamydia or gonorrhea. Ask your health care provider if you are at risk.  Ask your health care provider about whether you are at high risk for HIV. Your health care provider may recommend a prescription medicine to help prevent HIV infection. If you choose to take medicine to prevent HIV, you should first get tested for HIV. You should then be tested every 3 months for as long as you are taking the medicine. Follow these instructions at home: Lifestyle  Do not use any products that contain nicotine or tobacco, such as cigarettes, e-cigarettes, and chewing tobacco. If you need help quitting, ask your health care provider.  Do not use street drugs.  Do not share needles.  Ask your health care provider for help if you need support or information about quitting drugs. Alcohol use  Do not drink alcohol if your health care provider tells you not to drink.  If you drink alcohol: ? Limit how much you have to 0-2 drinks a day. ? Be aware of how much alcohol is in your drink. In the [place] one drink equals one 12 oz bottle of beer (355 mL), one 5 oz glass of wine (148 mL), or one 1 oz glass of hard liquor (44 mL). General instructions  Schedule regular health, dental, and eye exams.  Stay current with your vaccines.  Tell your health care provider if: ? You often feel depressed. ? You have ever been abused or do not feel safe at home. Summary  Adopting a healthy lifestyle and getting preventive care are important in promoting health and wellness.  Follow your health care provider's instructions about healthy diet, exercising, and getting tested or screened for diseases.  Follow your health care provider's instructions on monitoring  your cholesterol and blood pressure. This information is not intended to replace advice given to you by your health care provider. Make sure you discuss any questions you have with your health care provider. Document Revised: 07/17/2018 Document Reviewed: 07/17/2018 Elsevier Patient Education  2020 [employer].

## 2019-09-01 NOTE — Assessment & Plan Note (Signed)
Congratulated on weight loss to date. Pt motivated to continue efforts.

## 2019-09-01 NOTE — Assessment & Plan Note (Signed)
Preventative protocols reviewed and updated unless pt declined. Discussed healthy diet and lifestyle.  

## 2019-09-01 NOTE — Assessment & Plan Note (Signed)
Mild systolic.

## 2019-09-01 NOTE — Assessment & Plan Note (Signed)
Stable period on current regimen - continue.  

## 2019-09-01 NOTE — Assessment & Plan Note (Signed)
Chronic, stable. Continue lipitor.  The ASCVD Risk score (Goff DC Jr., et al., 2013) failed to calculate for the following reasons:   The valid total cholesterol range is 130 to 320 mg/dL  

## 2019-09-01 NOTE — Assessment & Plan Note (Addendum)
Persistent RLS symptoms - trial higher requip dose.

## 2019-09-05 ENCOUNTER — Telehealth: Payer: Self-pay

## 2019-09-05 NOTE — Telephone Encounter (Signed)
Filled and in Lisa's box 

## 2019-09-05 NOTE — Telephone Encounter (Signed)
Received faxed PA form from [facility] for cyclobenzaprine 10 mg tab.  Placed form in Dr. Timoteo Expose box.

## 2019-09-08 ENCOUNTER — Other Ambulatory Visit: Payer: Self-pay

## 2019-09-08 ENCOUNTER — Telehealth: Payer: Self-pay

## 2019-09-08 DIAGNOSIS — R195 Other fecal abnormalities: Secondary | ICD-10-CM

## 2019-09-08 NOTE — Telephone Encounter (Signed)
Form for PA faxed.

## 2019-09-08 NOTE — Telephone Encounter (Signed)
Yes - muscle spasms associated lumbar and cervical DDD.

## 2019-09-08 NOTE — Telephone Encounter (Signed)
Gastroenterology Pre-Procedure Review  Request Date: Friday 09/26/19 Requesting Physician: Dr. Vicente Males  PATIENT REVIEW QUESTIONS: The patient responded to the following health history questions as indicated:    1. Are you having any GI issues? no 2. Do you have a personal history of Polyps? no 3. Do you have a family history of Colon Cancer or Polyps? yes (Dad had colon polyps) 4. Diabetes Mellitus? no 5. Joint replacements in the past 12 months?no 6. Major health problems in the past 3 months?no 7. Any artificial heart valves, MVP, or defibrillator?Pacemaker Dr. Ubaldo Glassing is pts cardiologist    MEDICATIONS & ALLERGIES:    Patient reports the following regarding taking any anticoagulation/antiplatelet therapy:   Plavix, Coumadin, Eliquis, Xarelto, Lovenox, Pradaxa, Brilinta, or Effient? no Aspirin? Yes 81mg  daily  Patient confirms/reports the following medications:  Current Outpatient Medications  Medication Sig Dispense Refill  . aspirin 81 MG tablet Take 81 mg by mouth daily.      Marland Kitchen atorvastatin (LIPITOR) 20 MG tablet Take 1 tablet (20 mg total) by mouth at bedtime. 90 tablet 4  . cetirizine (ZYRTEC) 10 MG tablet Take 10 mg by mouth at bedtime.     . Cholecalciferol (VITAMIN D) 2000 units CAPS Take 2,000 Units by mouth daily.     . clindamycin (CLEOCIN) 150 MG capsule Take by mouth 3 (three) times daily.    Marland Kitchen CRANBERRY PO Take 4,200 mg by mouth at bedtime.     . cyclobenzaprine (FLEXERIL) 10 MG tablet Take 1 tablet (10 mg total) by mouth 2 (two) times daily as needed for muscle spasms. 40 tablet 0  . desonide (DESOWEN) 0.05 % ointment Apply 1 application topically 2 (two) times daily as needed (for rash on chin due to dryness). 15 g 0  . diphenhydrAMINE (BENADRYL) 50 MG capsule Take 50 mg by mouth at bedtime.    . docusate sodium (COLACE) 100 MG capsule Take 100 mg by mouth 2 (two) times daily as needed.     Marland Kitchen FLUoxetine (PROZAC) 20 MG capsule Take 1 capsule (20 mg total) by mouth daily.  90 capsule 4  . FLUoxetine (PROZAC) 40 MG capsule Take 1 capsule (40 mg total) by mouth daily. 90 capsule 4  . fluticasone (FLONASE) 50 MCG/ACT nasal spray Place 2 sprays into both nostrils daily. 48 g 4  . furosemide (LASIX) 20 MG tablet Take 1 tablet (20 mg total) by mouth daily. 90 tablet 4  . Garlic 4259 MG CAPS Take 1,000 mg by mouth daily.     . Melatonin 10 MG TABS Take 10 mg by mouth at bedtime as needed.    . Multiple Vitamin (MULTIVITAMIN PO) Take 1 tablet by mouth daily.      . Naloxone HCl (NARCAN) 4 MG/0.1ML LIQD Spray into one nostril. Repeat with second device into other nostril after 2-3 minutes if no or minimal response. 2 each 0  . NAPROXEN PO Take 500 mg by mouth as needed.    . Omega-3 1000 MG CAPS Take 1,000 mg by mouth at bedtime.    Marland Kitchen omeprazole (PRILOSEC) 20 MG capsule Take 1 capsule (20 mg total) by mouth daily as needed (GERD). 90 capsule 4  . Oxycodone HCl 10 MG TABS Take 1 tablet (10 mg total) by mouth every 6 (six) hours as needed. Must last 30 days 120 tablet 0  . Oxycodone HCl 10 MG TABS Take 1 tablet (10 mg total) by mouth every 6 (six) hours as needed. Must last 30 days 120 tablet 0  .  Oxycodone HCl 10 MG TABS Take 1 tablet (10 mg total) by mouth every 6 (six) hours as needed. Must last 30 days 120 tablet 0  . potassium citrate (UROCIT-K) 10 MEQ (1080 MG) SR tablet Take 1 tablet (10 mEq total) by mouth 2 (two) times daily. 180 tablet 3  . rOPINIRole (REQUIP) 1 MG tablet Take 1 tablet (1 mg total) by mouth at bedtime. 90 tablet 4  . Saw Palmetto, Serenoa repens, (SAW PALMETTO PO) Take 450 mg by mouth 2 (two) times daily.     . sildenafil (REVATIO) 20 MG tablet Take 3-5 tablets (60-100 mg total) by mouth daily as needed (for erectile dysfunction.). 30 tablet 3  . VITAMIN B COMPLEX-C CAPS Take by mouth daily.     . vitamin E 1000 UNIT capsule Take 1,000 Units by mouth daily.      No current facility-administered medications for this visit.    Patient  confirms/reports the following allergies:  Allergies  Allergen Reactions  . Ciprofloxacin Other (See Comments)    PANIC ATTACKS (Fluoroquinolones)   . Metformin Other (See Comments)    Bad acid reflux  . Buprenorphine Hcl Nausea Only  . Morphine And Related Nausea Only  . Penicillins Rash    Rash Has patient had a PCN reaction causing immediate rash, facial/tongue/throat swelling, SOB or lightheadedness with hypotension: Unknown Has patient had a PCN reaction causing severe rash involving mucus membranes or skin necrosis: Unknown Has patient had a PCN reaction that required hospitalization: No Has patient had a PCN reaction occurring within the last 10 years: No CHILDHOOD REACTION. If all of the above answers are "NO", then may proceed with Cephalosporin use.     No orders of the defined types were placed in this encounter.   AUTHORIZATION INFORMATION Primary Insurance: 1D#: Group #:  Secondary Insurance: 1D#: Group #:  SCHEDULE INFORMATION: Date: Friday 09/26/19 Time: Location:[facility]

## 2019-09-08 NOTE — Telephone Encounter (Signed)
Scott with [facility] [employer] left a voicemail stating that they received a PA request on Cyclobenzaprine 10 mg. Lorin Picket stated that the pharmacist needs additional clinical information to complete this request. Lorin Picket wants to know if patient has a Diagnosis of muscle spasms associated with painful muscular skeletal condition?  Call back [phone] opt 5

## 2019-09-09 NOTE — Telephone Encounter (Signed)
Spoke with [facility] relaying Dr. Timoteo Expose message.  Verbalizes understanding and will document in PA then send for review.

## 2019-09-10 ENCOUNTER — Other Ambulatory Visit: Payer: Self-pay

## 2019-09-10 ENCOUNTER — Ambulatory Visit: Payer: Medicare Other | Attending: Pain Medicine | Admitting: Pain Medicine

## 2019-09-10 DIAGNOSIS — M542 Cervicalgia: Secondary | ICD-10-CM

## 2019-09-10 DIAGNOSIS — G894 Chronic pain syndrome: Secondary | ICD-10-CM | POA: Diagnosis not present

## 2019-09-10 DIAGNOSIS — G8929 Other chronic pain: Secondary | ICD-10-CM | POA: Diagnosis not present

## 2019-09-10 MED ORDER — OXYCODONE HCL 10 MG PO TABS: 10.0000 mg | ORAL_TABLET | Freq: Four times a day (QID) | ORAL | 0 refills | Status: DC | PRN

## 2019-09-10 NOTE — Progress Notes (Signed)
Patient: Mark [name]  Service Category: E/M  Provider: Gaspar Cola, MD  DOB: [birth year]  DOS: 09/10/2019  Location: Office  MRN: [id number]  Setting: Ambulatory outpatient  Referring Provider: Ria Bush, MD  Type: Established Patient  Specialty: Interventional Pain Management  PCP: Ria Bush, MD  Location: Remote location  Delivery: TeleHealth     Virtual Encounter - Pain Management PROVIDER NOTE: Information contained herein reflects review and annotations entered in association with encounter. Interpretation of such information and data should be left to medically-trained personnel. Information provided to patient can be located elsewhere in the medical record under "Patient Instructions". Document created using STT-dictation technology, any transcriptional errors that may result from process are unintentional.    Contact & Pharmacy Preferred: [phone] Home: [phone] (home) Mobile: [phone] (mobile) E-mail: [email or web address]  [place], [place] [address] [place] [address] Phone: [phone] Fax: [phone]  [place], [place] [address] Phone: [phone] Fax: [phone]   Pre-screening  Mark [name] offered "in-person" vs "virtual" encounter. Mark [name] indicated preferring virtual for this encounter.   Reason COVID-19*  Social distancing based on CDC and AMA recommendations.   I contacted Mark [name] on 09/10/2019 via telephone.      I clearly identified myself as Gaspar Cola, MD. I verified that I was speaking with the correct person using two identifiers (Name: Mark [name], and date of birth: 24-Feb-1956).  Consent I sought verbal advanced consent from Mark [name] for virtual visit interactions. I informed Mark [name] of possible security and privacy concerns, risks, and limitations associated with  providing "not-in-person" medical evaluation and management services. I also informed Mark [name] of the availability of "in-person" appointments. Finally, I informed him that there would be a charge for the virtual visit and that Mark [name] could be  personally, fully or partially, financially responsible for it. Mark [name] expressed understanding and agreed to proceed.   Historic Elements   Mark [name] is a 64 y.o. year old, male patient evaluated today after his last encounter by our practice on 03/24/2019. Mark [name]  has a past medical history of Anxiety, Arthritis, Chronic pain, Depression, Heart murmur, History of kidney stones (April 12,2017), History of permanent cardiac pacemaker placement (05/31/2015), Hydrocele in adult (05/07/2015), Kidney stones, Migraine, MRSA carrier (2019), Presence of permanent cardiac pacemaker, Prostatitis, Sleep apnea, and Sleep apnea. Mark [name] also  has a past surgical history that includes Tonsillectomy (1964); Carpal tunnel release (Bilateral, 2006); Ulnar nerve repair (2008); Cervical spine surgery (2008); Rotator cuff repair (Left, 2009); pacemaker placement (2009); Lithotripsy (Bilateral, 2012); Lithotripsy (Left, 11/2015); Cardiovascular stress test (2014); Colonoscopy (07/2007); implantable cardioverter defibrillator (icd) generator change (Left, 12/13/2017); and pace maker revision (12/13/2017). Mark [name] has a current medication list which includes the following prescription(s): aspirin, atorvastatin, cetirizine, vitamin d, clindamycin, cranberry, cyclobenzaprine, desonide, diphenhydramine, docusate sodium, fluoxetine, fluoxetine, fluticasone, furosemide, garlic, melatonin, multiple vitamin, naloxone, naproxen, omega-3, omeprazole, [START ON 09/12/2019] oxycodone hcl, [START ON 10/12/2019] oxycodone hcl, [START ON 11/11/2019] oxycodone hcl, potassium citrate, ropinirole, saw palmetto (serenoa repens), sildenafil, vitamin b complex-c, and vitamin e. Mark [name]  reports that Mark [name] has never smoked.  Mark [name] has never used smokeless tobacco. Mark [name] reports that Mark [name] does not drink alcohol or use drugs. Mark [name] is allergic to ciprofloxacin; metformin; buprenorphine hcl; morphine and related; and penicillins.   HPI  Today, Mark [name] is  being contacted for medication management.  Mark [name] refers that the oxycodone occasionally will cause some itching when Mark [name] takes it, but Mark [name] is not having a whole lot of problems with that and Mark [name] takes Benadryl as needed.  Mark [name] says that for some strange reason it does not do it all the time, but it is not a problem.  Other than that, Mark [name] says that Mark [name] is doing well on his medication regimen and not having any other significant issues with that.  Pharmacotherapy Assessment  Analgesic: Oxycodone IR 10 mg, 1 tab PO q 6 hrs (40 mg/day total of oxycodone) MME/day:60 mg/day.   Monitoring: [place] PMP: PDMP reviewed during this encounter.       Pharmacotherapy: No side-effects or adverse reactions reported. Compliance: No problems identified. Effectiveness: Clinically acceptable. Plan: Refer to "POC".  UDS:  Summary  Date Value Ref Range Status  08/15/2018 FINAL  Final    Comment:    ==================================================================== TOXASSURE SELECT 13 (MW) ==================================================================== Test                             Result       Flag       Units Drug Present and Declared for Prescription Verification   Oxycodone                      1723         EXPECTED   ng/mg creat   Noroxycodone                   3560         EXPECTED   ng/mg creat    Sources of oxycodone include scheduled prescription medications.    Noroxycodone is an expected metabolite of oxycodone. ==================================================================== Test                      Result    Flag   Units      Ref Range   Creatinine              40               mg/dL      >=23 ==================================================================== Declared  Medications:  The flagging and interpretation on this report are based on the  following declared medications.  Unexpected results may arise from  inaccuracies in the declared medications.  **Note: The testing scope of this panel includes these medications:  Oxycodone  **Note: The testing scope of this panel does not include following  reported medications:  Aspirin  Atorvastatin (Lipitor)  Cetirizine (Zyrtec)  Cranberry Extract  Desonide  Docusate  Fluoxetine  Fluticasone  Furosemide  Multivitamin  Naloxone  Naproxen  Omega-3 Fatty Acids  Omeprazole  Potassium Citrate  Ropinirole  Sildenafil  Supplement (Saw Palmetto)  Vitamin B  Vitamin D  Vitamin E ==================================================================== For clinical consultation, please call [phone]. ====================================================================    Laboratory Chemistry Profile (12 mo)  Renal: 08/13/2019: BUN 25; Creatinine, Ser 1.03  Lab Results  Component Value Date   GFR 73.03 08/13/2019   GFRAA >60 12/06/2017   GFRNONAA >60 12/06/2017   Hepatic: 08/13/2019: Albumin 4.3 Lab Results  Component Value Date   AST 39 (H) 08/13/2019   ALT 29 08/13/2019   Other: No results found for requested labs within last 8760 hours.  Note: Above Lab results reviewed.  Imaging  Abdomen 1 view (KUB) CLINICAL DATA:  Nephrolithiasis.  EXAM: ABDOMEN - 1 VIEW  COMPARISON:  10/08/2017  FINDINGS: Bilateral renal calculi appear larger on today's study. Right lower pole calculus approximately 15 mm. Two calculi left lower pole appear larger measuring approximately 4 mm, and 8 mm in diameter. Phleboliths in the right pelvis unchanged  Normal bowel gas pattern.  No acute skeletal abnormality.  IMPRESSION: Bilateral renal calculi appear larger.  Electronically Signed   By: Marlan Palau M.D.   On: 09/25/2018 09:24   Assessment  The primary encounter diagnosis was Chronic pain  syndrome. A diagnosis of Chronic neck pain (Primary Area of Pain) (Bilateral) (L>R) was also pertinent to this visit.  Plan of Care  Problem-specific:  No problem-specific Assessment & Plan notes found for this encounter.  I am having Mark [name] start on Oxycodone HCl and Oxycodone HCl. I am also having him maintain his cetirizine, aspirin, Multiple Vitamin (MULTIVITAMIN PO), Garlic, naloxone, Vitamin B Complex-C, CRANBERRY PO, Vitamin D, (Saw Palmetto, Serenoa repens, (SAW PALMETTO PO)), vitamin E, docusate sodium, Omega-3, desonide, diphenhydrAMINE, clindamycin, sildenafil, potassium citrate, Melatonin, NAPROXEN PO, fluticasone, furosemide, FLUoxetine, atorvastatin, omeprazole, FLUoxetine, rOPINIRole, cyclobenzaprine, and Oxycodone HCl.  Pharmacotherapy (Medications Ordered): Meds ordered this encounter  Medications  . Oxycodone HCl 10 MG TABS    Sig: Take 1 tablet (10 mg total) by mouth every 6 (six) hours as needed. Must last 30 days    Dispense:  120 tablet    Refill:  0    Chronic Pain: STOP Act (Not applicable) Fill 1 day early if closed on refill date. Do not fill until: 09/12/2019. To last until: 10/12/2019. Avoid benzodiazepines within 8 hours of opioids  . Oxycodone HCl 10 MG TABS    Sig: Take 1 tablet (10 mg total) by mouth every 6 (six) hours as needed. Must last 30 days    Dispense:  120 tablet    Refill:  0    Chronic Pain: STOP Act (Not applicable) Fill 1 day early if closed on refill date. Do not fill until: 10/12/2019. To last until: 11/11/2019. Avoid benzodiazepines within 8 hours of opioids  . Oxycodone HCl 10 MG TABS    Sig: Take 1 tablet (10 mg total) by mouth every 6 (six) hours as needed. Must last 30 days    Dispense:  120 tablet    Refill:  0    Chronic Pain: STOP Act (Not applicable) Fill 1 day early if closed on refill date. Do not fill until: 11/11/2019. To last until: 12/11/2019. Avoid benzodiazepines within 8 hours of opioids   Orders:  No orders of the defined  types were placed in this encounter.  Follow-up plan:   Return in about 13 weeks (around 12/10/2019) for (VV), (MM).      Considering:  Diagnostic right hand digit 2 trigger finger injection Therapeutic bilateral IA Hyalgan knee injections  Diagnostic bilateral cervical facet block  Possible bilateral cervical facet RFA  Diagnostic left sided CESI  Diagnostic bilateral IA knee injection Diagnostic bilateral genicular NB Possible bilateral genicular nerve RFA  Diagnostic bilateral sacroiliac joint block Possible bilateral sacroiliac joint RFA  Diagnostic bilateral IA shoulder jointinjection  Diagnostic bilateral suprascapular NB Possible bilateral suprascapular nerve RFA  Diagnostic bilateral lumbar facet block Possible bilateral lumbar facet RFA  Diagnostic left L4-5 LESI    Palliative PRN treatment(s):  Bilateral intra-articular Hyalgan knee injectionseries (injection #4)  Diagnostic bilateral cervical facetblock  Diagnostic left sided CESI  Diagnostic bilateral IA knee injection Diagnostic bilateral genicular NB Diagnostic bilateral sacroiliac  joint block Diagnostic bilateral IA shoulder joint injection  Diagnostic bilateral suprascapularNB  Diagnostic bilateral lumbar facetblock  Diagnostic left L4-5 LESI      Recent Visits No visits were found meeting these conditions.  Showing recent visits within past 90 days and meeting all other requirements   Today's Visits Date Type Provider Dept  09/10/19 Telemedicine [name], MD [facility]  Showing today's visits and meeting all other requirements   Future Appointments No visits were found meeting these conditions.  Showing future appointments within next 90 days and meeting all other requirements   I discussed the assessment and treatment plan with the patient. The patient was provided an opportunity to ask questions and all were answered. The patient agreed with the plan and  demonstrated an understanding of the instructions.  Patient advised to call back or seek an in-person evaluation if the symptoms or condition worsens.  Duration of encounter: 13 minutes.  Note by: Oswaldo Done, MD Date: 09/10/2019; Time: 1:45 PM

## 2019-09-10 NOTE — Telephone Encounter (Signed)
Received fax from [facility] that Cyclobenzaprine is covered through 09/07/2020. Patient was advised of this. FYI to PCP. Placing fax information on Dr Timoteo Expose inbox also

## 2019-09-11 ENCOUNTER — Encounter: Payer: Self-pay | Admitting: Urology

## 2019-09-11 ENCOUNTER — Ambulatory Visit: Payer: Medicare Other | Admitting: Urology

## 2019-09-11 ENCOUNTER — Other Ambulatory Visit: Payer: Self-pay

## 2019-09-11 ENCOUNTER — Ambulatory Visit
Admission: RE | Admit: 2019-09-11 | Discharge: 2019-09-11 | Disposition: A | Discharge status: Home / Self Care | Payer: Medicare Other | Source: Ambulatory Visit | Attending: Urology | Admitting: Urology

## 2019-09-11 ENCOUNTER — Ambulatory Visit
Admission: RE | Admit: 2019-09-11 | Discharge: 2019-09-11 | Disposition: A | Discharge status: Home / Self Care | Payer: Medicare Other | Attending: Urology | Admitting: Urology

## 2019-09-11 VITALS — BP 138/74 | HR 70 | Ht 67.0 in | Wt 225.0 lb

## 2019-09-11 DIAGNOSIS — N200 Calculus of kidney: Secondary | ICD-10-CM

## 2019-09-11 MED ORDER — POTASSIUM CITRATE ER 10 MEQ (1080 MG) PO TBCR: 10.0000 meq | EXTENDED_RELEASE_TABLET | Freq: Two times a day (BID) | ORAL | 3 refills | Status: DC

## 2019-09-11 NOTE — Progress Notes (Signed)
09/11/2019 2:37 PM   Mark [name] 31-May-1956 [id number]  Referring provider: Ria Bush, MD [address] [place],  [place] [address]  Chief Complaint  Patient presents with  . Nephrolithiasis    Urologic history: 1.  Nephrolithiasis             -Bilateral, nonobstructing renal calculi             -Left ureteroscopic stone removal 11/2015             -Hypocitraturia/hyperoxaluria on 24 urine study             -Abnormalities were normal on follow-up 24-hour urine  2.  Erectile dysfunction             -On generic sildenafil   HPI: 64 y.o. male presents for annual follow-up of nephrolithiasis.  He had an episode of right low back pain approximately 2 months ago however it radiated to the buttock down the posterior leg.  He states this resolved with muscle relaxants and he currently is having no problems.  He denies renal colic or bothersome lower urinary tract symptoms.  No dysuria, gross hematuria.  KUB performed today was reviewed.  The right lower pole calculus is stable.  There is slight interval growth in the left lower pole calculi.  There is also a density overlying the left 11th rib which most likely represents stool in the colon.  Recent PSA was stable at 1.38  PMH: Past Medical History:  Diagnosis Date  . Anxiety   . Arthritis   . Chronic pain   . Depression   . Heart murmur   . History of kidney stones April 12,2017   removed two kidney stones about 1 cm in size  . History of permanent cardiac pacemaker placement 05/31/2015  . Hydrocele in adult 05/07/2015  . Kidney stones   . Migraine   . MRSA carrier 2019  . Presence of permanent cardiac pacemaker   . Prostatitis   . Sleep apnea   . Sleep apnea    getting new cpap machine    Surgical History: Past Surgical History:  Procedure Laterality Date  . CARDIOVASCULAR STRESS TEST  2014   Dr Ubaldo Glassing  . CARPAL TUNNEL RELEASE Bilateral 2006  . CERVICAL SPINE SURGERY  2008   C5/6  . COLONOSCOPY   07/2007   prostate nodule, hemorrhoid, rpt 5 yrs [name])  . IMPLANTABLE CARDIOVERTER DEFIBRILLATOR (ICD) GENERATOR CHANGE Left 12/13/2017   Procedure: PACER CHANGE OUT;  Surgeon: Isaias Cowman, MD;  Location: [facility] ORS;  Service: Cardiovascular;  Laterality: Left;  . LITHOTRIPSY Bilateral 2012   Neal Oshea  . LITHOTRIPSY Left 11/2015   x2 [place]  . pace maker revision  12/13/2017  . PACEMAKER PLACEMENT  2009   s/p Medtronic Adapta DR Pacemaker (Dr. Ubaldo Glassing)  . ROTATOR CUFF REPAIR Left 2009  . TONSILLECTOMY  1964  . ULNAR NERVE REPAIR  2008   Left    Home Medications:  Allergies as of 09/11/2019      Reactions   Ciprofloxacin Other (See Comments)   PANIC ATTACKS (Fluoroquinolones)   Metformin Other (See Comments)   Bad acid reflux   Buprenorphine Hcl Nausea Only   Morphine And Related Nausea Only   Penicillins Rash   Rash Has patient had a PCN reaction causing immediate rash, facial/tongue/throat swelling, SOB or lightheadedness with hypotension: Unknown Has patient had a PCN reaction causing severe rash involving mucus membranes or skin necrosis: Unknown Has patient had a  PCN reaction that required hospitalization: No Has patient had a PCN reaction occurring within the last 10 years: No CHILDHOOD REACTION. If all of the above answers are "NO", then may proceed with Cephalosporin use.      Medication List       Accurate as of September 11, 2019  2:37 PM. If you have any questions, ask your nurse or doctor.        aspirin 81 MG tablet Take 81 mg by mouth daily.   atorvastatin 20 MG tablet Commonly known as: LIPITOR Take 1 tablet (20 mg total) by mouth at bedtime.   cetirizine 10 MG tablet Commonly known as: ZYRTEC Take 10 mg by mouth at bedtime.   clindamycin 150 MG capsule Commonly known as: CLEOCIN Take by mouth 3 (three) times daily.   CRANBERRY PO Take 4,200 mg by mouth at bedtime.   cyclobenzaprine 10 MG tablet Commonly known as: FLEXERIL Take 1 tablet  (10 mg total) by mouth 2 (two) times daily as needed for muscle spasms.   desonide 0.05 % ointment Commonly known as: DESOWEN Apply 1 application topically 2 (two) times daily as needed (for rash on chin due to dryness).   diphenhydrAMINE 50 MG capsule Commonly known as: BENADRYL Take 50 mg by mouth at bedtime.   docusate sodium 100 MG capsule Commonly known as: COLACE Take 100 mg by mouth 2 (two) times daily as needed.   FLUoxetine 20 MG capsule Commonly known as: PROZAC Take 1 capsule (20 mg total) by mouth daily.   FLUoxetine 40 MG capsule Commonly known as: PROZAC Take 1 capsule (40 mg total) by mouth daily.   fluticasone 50 MCG/ACT nasal spray Commonly known as: FLONASE Place 2 sprays into both nostrils daily.   furosemide 20 MG tablet Commonly known as: LASIX Take 1 tablet (20 mg total) by mouth daily.   Garlic 4268 MG Caps Take 1,000 mg by mouth daily.   Melatonin 10 MG Tabs Take 10 mg by mouth at bedtime as needed.   MULTIVITAMIN PO Take 1 tablet by mouth daily.   naloxone 4 MG/0.1ML Liqd nasal spray kit Commonly known as: Narcan Spray into one nostril. Repeat with second device into other nostril after 2-3 minutes if no or minimal response.   NAPROXEN PO Take 500 mg by mouth as needed.   Omega-3 1000 MG Caps Take 1,000 mg by mouth at bedtime.   omeprazole 20 MG capsule Commonly known as: PRILOSEC Take 1 capsule (20 mg total) by mouth daily as needed (GERD).   Oxycodone HCl 10 MG Tabs Take 1 tablet (10 mg total) by mouth every 6 (six) hours as needed. Must last 30 days Start taking on: September 12, 2019   Oxycodone HCl 10 MG Tabs Take 1 tablet (10 mg total) by mouth every 6 (six) hours as needed. Must last 30 days Start taking on: October 12, 2019   Oxycodone HCl 10 MG Tabs Take 1 tablet (10 mg total) by mouth every 6 (six) hours as needed. Must last 30 days Start taking on: November 11, 2019   potassium citrate 10 MEQ (1080 MG) SR tablet Commonly  known as: UROCIT-K Take 1 tablet (10 mEq total) by mouth 2 (two) times daily.   rOPINIRole 1 MG tablet Commonly known as: REQUIP Take 1 tablet (1 mg total) by mouth at bedtime.   SAW PALMETTO PO Take 450 mg by mouth 2 (two) times daily.   sildenafil 20 MG tablet Commonly known as: REVATIO Take 3-5 tablets (  60-100 mg total) by mouth daily as needed (for erectile dysfunction.).   Vitamin B Complex-C Caps Take by mouth daily.   Vitamin D 50 MCG (2000 UT) Caps Take 2,000 Units by mouth daily.   vitamin E 1000 UNIT capsule Take 1,000 Units by mouth daily.       Allergies:  Allergies  Allergen Reactions  . Ciprofloxacin Other (See Comments)    PANIC ATTACKS (Fluoroquinolones)   . Metformin Other (See Comments)    Bad acid reflux  . Buprenorphine Hcl Nausea Only  . Morphine And Related Nausea Only  . Penicillins Rash    Rash Has patient had a PCN reaction causing immediate rash, facial/tongue/throat swelling, SOB or lightheadedness with hypotension: Unknown Has patient had a PCN reaction causing severe rash involving mucus membranes or skin necrosis: Unknown Has patient had a PCN reaction that required hospitalization: No Has patient had a PCN reaction occurring within the last 10 years: No CHILDHOOD REACTION. If all of the above answers are "NO", then may proceed with Cephalosporin use.     Family History: Family History  Problem Relation Age of Onset  . Heart disease Mother   . Diabetes Mother   . Arthritis Mother   . Cancer Mother        Breast Cancer  . Heart block Father   . Heart disease Father   . Arthritis Father   . Cancer Father        skin cancer    Social History:  reports that he has never smoked. He has never used smokeless tobacco. He reports that he does not drink alcohol or use drugs.  ROS: UROLOGY Frequent Urination?: No Hard to postpone urination?: No Burning/pain with urination?: No Get up at night to urinate?: No Leakage of urine?:  No Urine stream starts and stops?: No Trouble starting stream?: No Do you have to strain to urinate?: No Blood in urine?: No Urinary tract infection?: No Sexually transmitted disease?: No Injury to kidneys or bladder?: No Painful intercourse?: No Weak stream?: Yes Erection problems?: No Penile pain?: No  Gastrointestinal Nausea?: No Vomiting?: No Indigestion/heartburn?: No Diarrhea?: No Constipation?: No  Constitutional Fever: No Night sweats?: No Weight loss?: No Fatigue?: No  Skin Skin rash/lesions?: No Itching?: No  Eyes Blurred vision?: No Double vision?: No  Ears/Nose/Throat Sore throat?: No Sinus problems?: No  Hematologic/Lymphatic Swollen glands?: No Easy bruising?: No  Cardiovascular Leg swelling?: No Chest pain?: No  Respiratory Cough?: No Shortness of breath?: Yes  Endocrine Excessive thirst?: Yes  Musculoskeletal Back pain?: Yes Joint pain?: Yes  Neurological Headaches?: No Dizziness?: No  Psychologic Depression?: Yes Anxiety?: Yes  Physical Exam: BP 138/74   Pulse 70   Ht 5' 7"  (1.702 m)   Wt 225 lb (102.1 kg)   BMI 35.24 kg/m   Constitutional:  Alert and oriented, No acute distress. HEENT: [place] AT, moist mucus membranes.  Trachea midline, no masses. Cardiovascular: No clubbing, cyanosis, or edema. Respiratory: Normal respiratory effort, no increased work of breathing. GI: Abdomen is soft, nontender, nondistended, no abdominal masses GU: Prostate 40 g, smooth without nodules Skin: No rashes, bruises or suspicious lesions. Neurologic: Grossly intact, no focal deficits, moving all 4 extremities. Psychiatric: Normal mood and affect.   Assessment & Plan:    - Nephrolithiasis Asymptomatic bilateral lower pole calculi.  Most likely stool overlying the 11th rib and will repeat a KUB in approximately 2 weeks.  Potassium citrate was refilled.  Continue annual follow-up with KUB.   Bintou Lafata  Mariana Arn, MD  [place] [address] [place],  [address] [phone]

## 2019-09-22 ENCOUNTER — Telehealth: Payer: Self-pay | Admitting: Gastroenterology

## 2019-09-22 NOTE — Telephone Encounter (Signed)
Patient  called & l/m on v/m to r/s colonoscopy on 09-26-2019.

## 2019-09-22 NOTE — Telephone Encounter (Signed)
PT LEFT VM TO RESCHEDULE HIS PROCEDURE FOR 09/26/19

## 2019-09-23 NOTE — Telephone Encounter (Signed)
Pt canceled his colonoscopy 09/26/19 with Dr. Tobi Bastos at [facility] due to other matters he said he needs to take care of.  Colonoscopy canceled with Trish in Endo.  Thanks,  [place], [place]

## 2019-09-24 ENCOUNTER — Other Ambulatory Visit: Admission: RE | Admit: 2019-09-24 | Payer: Medicare Other | Source: Ambulatory Visit

## 2019-09-26 ENCOUNTER — Encounter: Admission: RE | Payer: Self-pay | Source: Home / Self Care

## 2019-09-26 ENCOUNTER — Ambulatory Visit: Admission: RE | Admit: 2019-09-26 | Payer: Medicare Other | Source: Home / Self Care | Admitting: Gastroenterology

## 2019-09-26 SURGERY — COLONOSCOPY WITH PROPOFOL
Anesthesia: General

## 2019-10-20 ENCOUNTER — Ambulatory Visit
Admission: RE | Admit: 2019-10-20 | Discharge: 2019-10-20 | Disposition: A | Discharge status: Home / Self Care | Payer: Medicare Other | Attending: Urology | Admitting: Urology

## 2019-10-20 ENCOUNTER — Other Ambulatory Visit: Payer: Self-pay

## 2019-10-20 ENCOUNTER — Ambulatory Visit
Admission: RE | Admit: 2019-10-20 | Discharge: 2019-10-20 | Disposition: A | Discharge status: Home / Self Care | Payer: Medicare Other | Source: Ambulatory Visit | Attending: Urology | Admitting: Urology

## 2019-10-20 DIAGNOSIS — N200 Calculus of kidney: Secondary | ICD-10-CM | POA: Diagnosis not present

## 2019-10-21 DIAGNOSIS — I495 Sick sinus syndrome: Secondary | ICD-10-CM | POA: Diagnosis not present

## 2019-10-24 ENCOUNTER — Telehealth: Payer: Self-pay | Admitting: *Deleted

## 2019-10-24 NOTE — Telephone Encounter (Signed)
-----   Message from [name], MD sent at 10/23/2019 10:38 PM EDT ----- The possible calcifications seen on KUB last month is not visualized on this KUB and was consistent with stool in the colon.

## 2019-10-28 NOTE — Telephone Encounter (Signed)
Notified patient as instructed, patient pleased. Discussed follow-up appointments, patient agrees  

## 2019-11-18 ENCOUNTER — Other Ambulatory Visit: Payer: Self-pay | Admitting: Family Medicine

## 2019-11-19 NOTE — Telephone Encounter (Signed)
Electronic refill request Cyclobenzaprine Last office visit 09/01/19 Last refill 09/01/19 #40

## 2019-12-01 ENCOUNTER — Encounter: Payer: Self-pay | Admitting: Pain Medicine

## 2019-12-01 NOTE — Progress Notes (Signed)
Patient: Mark [name]  Service Category: E/M  Provider: Gaspar Cola, MD  DOB: [birth year]  DOS: 12/02/2019  Location: Office  MRN: [id number]  Setting: Ambulatory outpatient  Referring Provider: Ria Bush, MD  Type: Established Patient  Specialty: Interventional Pain Management  PCP: Ria Bush, MD  Location: Remote location  Delivery: TeleHealth     Virtual Encounter - Pain Management PROVIDER NOTE: Information contained herein reflects review and annotations entered in association with encounter. Interpretation of such information and data should be left to medically-trained personnel. Information provided to patient can be located elsewhere in the medical record under "Patient Instructions". Document created using STT-dictation technology, any transcriptional errors that may result from process are unintentional.    Contact & Pharmacy Preferred: [phone] Home: [phone] (home) Mobile: [phone] (mobile) E-mail: [email or web address]  [place], [place] [address] [place] [address] Phone: [phone] Fax: [phone]  [facility] ([place]) [place], [place] [address] Phone: [phone] Fax: [phone]   Pre-screening  [name] offered "in-person" vs "virtual" encounter. He indicated preferring virtual for this encounter.   Reason COVID-19*  Social distancing based on CDC and AMA recommendations.   I contacted [name] on 12/02/2019 via telephone.      I clearly identified myself as Gaspar Cola, MD. I verified that I was speaking with the correct person using two identifiers (Name: Mark [name], and date of birth: Oct 22, 1955).  Consent I sought verbal advanced consent from [name] for virtual visit interactions. I informed Mr. Gilkes of possible security and privacy concerns, risks, and limitations  associated with providing "not-in-person" medical evaluation and management services. I also informed Mr. Loberg of the availability of "in-person" appointments. Finally, I informed him that there would be a charge for the virtual visit and that he could be  personally, fully or partially, financially responsible for it. Mr. Cassedy expressed understanding and agreed to proceed.   Historic Elements   Mark [name] is a 64 y.o. year old, male patient evaluated today after his last contact with our practice on Visit date not found. [name]  has a past medical history of Anxiety, Arthritis, Chronic pain, Depression, Heart murmur, History of kidney stones (April 12,2017), History of permanent cardiac pacemaker placement (05/31/2015), Hydrocele in adult (05/07/2015), Kidney stones, Migraine, MRSA carrier (2019), Presence of permanent cardiac pacemaker, Prostatitis, Sleep apnea, and Sleep apnea. He also  has a past surgical history that includes Tonsillectomy (1964); Carpal tunnel release (Bilateral, 2006); Ulnar nerve repair (2008); Cervical spine surgery (2008); Rotator cuff repair (Left, 2009); pacemaker placement (2009); Lithotripsy (Bilateral, 2012); Lithotripsy (Left, 11/2015); Cardiovascular stress test (2014); Colonoscopy (07/2007); implantable cardioverter defibrillator (icd) generator change (Left, 12/13/2017); and pace maker revision (12/13/2017). [name] has a current medication list which includes the following prescription(s): vitamin c, aspirin, atorvastatin, cetirizine, vitamin d, clindamycin, cranberry, cyclobenzaprine, desonide, diphenhydramine, docusate sodium, fluoxetine, fluoxetine, fluticasone, furosemide, garlic, melatonin, multiple vitamin, naloxone, naproxen, omega-3, omeprazole, [START ON 12/11/2019] oxycodone hcl, [START ON 01/10/2020] oxycodone hcl, [START ON 02/09/2020] oxycodone hcl, potassium citrate, ropinirole, saw palmetto (serenoa repens), sildenafil, vitamin b complex-c, and vitamin e.  He  reports that he has never smoked. He has never used smokeless tobacco. He reports that he does not drink alcohol or use drugs. [name] is allergic to ciprofloxacin; metformin; buprenorphine hcl; morphine and related; and penicillins.  HPI  Today, he is being contacted for medication management.  The patient indicates doing well with the current medication regimen. No adverse reactions or side effects reported to the medications.   Pharmacotherapy Assessment  Analgesic: Oxycodone IR 10 mg, 1 tab PO q 6 hrs (40 mg/day total of oxycodone) MME/day:60 mg/day.   Monitoring: [place] PMP: PDMP reviewed during this encounter.       Pharmacotherapy: No side-effects or adverse reactions reported. Compliance: No problems identified. Effectiveness: Clinically acceptable. Plan: Refer to "POC".  UDS:  Summary  Date Value Ref Range Status  08/15/2018 FINAL  Final    Comment:    ==================================================================== TOXASSURE SELECT 13 (MW) ==================================================================== Test                             Result       Flag       Units Drug Present and Declared for Prescription Verification   Oxycodone                      1723         EXPECTED   ng/mg creat   Noroxycodone                   3560         EXPECTED   ng/mg creat    Sources of oxycodone include scheduled prescription medications.    Noroxycodone is an expected metabolite of oxycodone. ==================================================================== Test                      Result    Flag   Units      Ref Range   Creatinine              40               mg/dL      >=20 ==================================================================== Declared Medications:  The flagging and interpretation on this report are based on the  following declared medications.  Unexpected results may arise from  inaccuracies in the declared medications.  **Note: The testing scope of  this panel includes these medications:  Oxycodone  **Note: The testing scope of this panel does not include following  reported medications:  Aspirin  Atorvastatin (Lipitor)  Cetirizine (Zyrtec)  Cranberry Extract  Desonide  Docusate  Fluoxetine  Fluticasone  Furosemide  Multivitamin  Naloxone  Naproxen  Omega-3 Fatty Acids  Omeprazole  Potassium Citrate  Ropinirole  Sildenafil  Supplement (Saw Palmetto)  Vitamin B  Vitamin D  Vitamin E ==================================================================== For clinical consultation, please call [phone]. ====================================================================    Laboratory Chemistry Profile   Renal Lab Results  Component Value Date   BUN 25 (H) 08/13/2019   CREATININE 1.03 08/13/2019   GFR 73.03 08/13/2019   GFRAA >60 12/06/2017   GFRNONAA >60 12/06/2017     Hepatic Lab Results  Component Value Date   AST 39 (H) 08/13/2019   ALT 29 08/13/2019   ALBUMIN 4.3 08/13/2019   ALKPHOS 65 08/13/2019   HCVAB NEGATIVE 07/22/2013     Electrolytes Lab Results  Component Value Date   NA 140 08/13/2019   K 3.6 08/13/2019   CL 101 08/13/2019   CALCIUM 9.3 08/13/2019   MG 2.1 09/01/2015     Bone Lab Results  Component Value Date   25OHVITD1 63 02/02/2016   25OHVITD2 <1.0 02/02/2016   25OHVITD3 63 02/02/2016  TESTOSTERONE 216.29 (L) 02/13/2017     Inflammation (CRP: Acute Phase) (ESR: Chronic Phase) Lab Results  Component Value Date   CRP 1.7 (H) 09/01/2015   ESRSEDRATE 6 09/01/2015       Note: Above Lab results reviewed.  Imaging  Abdomen 1 view (KUB) CLINICAL DATA:  History of nephrolithiasis. History of left lithotripsy.  EXAM: ABDOMEN - 1 VIEW  COMPARISON:  09/11/2019.  CT 06/14/2011.  FINDINGS: Stable prominent stones noted bilaterally. Stable pelvic calcifications, most likely phleboliths. No evidence of ureteral stone. No bowel distention. Degenerative change  thoracolumbar spine and both hips.  IMPRESSION: Stable bilateral nephrolithiasis.  No evidence of urolithiasis.  Electronically Signed   By: Lavon   On: 10/21/2019 05:52  Assessment  The primary encounter diagnosis was Chronic pain syndrome. 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  Plan of Care  Problem-specific:  No problem-specific Assessment & Plan notes found for this encounter.  Mr. MAXAMILLION BANAS has a current medication list which includes the following long-term medication(s): atorvastatin, cetirizine, diphenhydramine, fluoxetine, fluoxetine, fluticasone, furosemide, omeprazole, [START ON 12/11/2019] oxycodone hcl, [START ON 01/10/2020] oxycodone hcl, [START ON 02/09/2020] oxycodone hcl, and ropinirole.  Pharmacotherapy (Medications Ordered): Meds ordered this encounter  Medications  . Oxycodone HCl 10 MG TABS    Sig: Take 1 tablet (10 mg total) by mouth every 6 (six) hours as needed. Must last 30 days    Dispense:  120 tablet    Refill:  0    Chronic Pain: STOP Act (Not applicable) Fill 1 day early if closed on refill date. Do not fill until: 12/11/2019. To last until: 01/10/2020. Avoid benzodiazepines within 8 hours of opioids  . Oxycodone HCl 10 MG TABS    Sig: Take 1 tablet (10 mg total) by mouth every 6 (six) hours as needed. Must last 30 days    Dispense:  120 tablet    Refill:  0    Chronic Pain: STOP Act (Not applicable) Fill 1 day early if closed on refill date. Do not fill until: 01/10/2020. To last until: 02/09/2020. Avoid benzodiazepines within 8 hours of opioids  . Oxycodone HCl 10 MG TABS    Sig: Take 1 tablet (10 mg total) by mouth every 6 (six) hours as needed. Must last 30 days    Dispense:  120 tablet    Refill:  0    Chronic Pain: STOP Act (Not applicable) Fill 1 day early  if closed on refill date. Do not fill until: 02/09/2020. To last until: 03/10/2020. Avoid benzodiazepines within 8 hours of opioids   Orders:  Orders Placed This Encounter  Procedures  . ToxASSURE Select 13 (MW), Urine    Volume: 30 ml(s). Minimum 3 ml of urine is needed. Document temperature of fresh sample. Indications: Long term (current) use of opiate analgesic (F79.024)    Order Specific Question:   Release to patient    Answer:   Immediate   Follow-up plan:   Return in about 3 months (around 03/10/2020) for (F2F), (MM).      Considering:  Diagnostic right hand digit 2 trigger finger injection Therapeutic bilateral IA Hyalgan knee injections  Diagnostic bilateral cervical facet block  Possible bilateral cervical facet RFA  Diagnostic left sided CESI  Diagnostic bilateral IA knee injection Diagnostic bilateral genicular NB Possible bilateral genicular nerve RFA  Diagnostic bilateral  sacroiliac joint block Possible bilateral sacroiliac joint RFA  Diagnostic bilateral IA shoulder jointinjection  Diagnostic bilateral suprascapular NB Possible bilateral suprascapular nerve RFA  Diagnostic bilateral lumbar facet block Possible bilateral lumbar facet RFA  Diagnostic left L4-5 LESI    Palliative PRN treatment(s):  Bilateral intra-articular Hyalgan knee injectionseries (injection #4)  Diagnostic bilateral cervical facetblock  Diagnostic left sided CESI  Diagnostic bilateral IA knee injection Diagnostic bilateral genicular NB Diagnostic bilateral sacroiliac joint block Diagnostic bilateral IA shoulder joint injection  Diagnostic bilateral suprascapularNB  Diagnostic bilateral lumbar facetblock  Diagnostic left L4-5 LESI    Recent Visits Date Type Provider Dept  09/10/19 Telemedicine Milinda Pointer, MD [facility]  Showing recent visits within past 90 days and meeting all other requirements   Future Appointments No visits were found meeting these  conditions.  Showing future appointments within next 90 days and meeting all other requirements   I discussed the assessment and treatment plan with the patient. The patient was provided an opportunity to ask questions and all were answered. The patient agreed with the plan and demonstrated an understanding of the instructions.  Patient advised to call back or seek an in-person evaluation if the symptoms or condition worsens.  Duration of encounter: 12 minutes.  Note by: Gaspar Cola, MD Date: 12/02/2019; Time: 8:50 AM

## 2019-12-02 ENCOUNTER — Other Ambulatory Visit: Payer: Self-pay

## 2019-12-02 ENCOUNTER — Ambulatory Visit: Payer: Medicare Other | Attending: Pain Medicine | Admitting: Pain Medicine

## 2019-12-02 DIAGNOSIS — M47816 Spondylosis without myelopathy or radiculopathy, lumbar region: Secondary | ICD-10-CM | POA: Diagnosis not present

## 2019-12-02 DIAGNOSIS — Z789 Other specified health status: Secondary | ICD-10-CM | POA: Insufficient documentation

## 2019-12-02 DIAGNOSIS — M542 Cervicalgia: Secondary | ICD-10-CM

## 2019-12-02 DIAGNOSIS — M899 Disorder of bone, unspecified: Secondary | ICD-10-CM | POA: Insufficient documentation

## 2019-12-02 DIAGNOSIS — Z79899 Other long term (current) drug therapy: Secondary | ICD-10-CM

## 2019-12-02 DIAGNOSIS — M961 Postlaminectomy syndrome, not elsewhere classified: Secondary | ICD-10-CM

## 2019-12-02 DIAGNOSIS — G8929 Other chronic pain: Secondary | ICD-10-CM

## 2019-12-02 DIAGNOSIS — G894 Chronic pain syndrome: Secondary | ICD-10-CM | POA: Diagnosis not present

## 2019-12-02 MED ORDER — OXYCODONE HCL 10 MG PO TABS: 10.0000 mg | ORAL_TABLET | Freq: Four times a day (QID) | ORAL | 0 refills | Status: DC | PRN

## 2019-12-10 ENCOUNTER — Telehealth: Payer: Medicare Other | Admitting: Pain Medicine

## 2020-01-06 DIAGNOSIS — Z950 Presence of cardiac pacemaker: Secondary | ICD-10-CM | POA: Diagnosis not present

## 2020-01-06 DIAGNOSIS — I10 Essential (primary) hypertension: Secondary | ICD-10-CM | POA: Diagnosis not present

## 2020-01-06 DIAGNOSIS — R001 Bradycardia, unspecified: Secondary | ICD-10-CM | POA: Diagnosis not present

## 2020-01-06 DIAGNOSIS — G4733 Obstructive sleep apnea (adult) (pediatric): Secondary | ICD-10-CM | POA: Diagnosis not present

## 2020-01-08 LAB — TOXASSURE SELECT 13 (MW), URINE

## 2020-02-02 ENCOUNTER — Other Ambulatory Visit: Payer: Self-pay | Admitting: Family Medicine

## 2020-02-02 NOTE — Telephone Encounter (Signed)
Flexeril Last filled:  11/21/19, #40 Last OV:  09/01/19, AWV prt 2 Next OV:  none

## 2020-02-24 DIAGNOSIS — I495 Sick sinus syndrome: Secondary | ICD-10-CM | POA: Diagnosis not present

## 2020-03-02 NOTE — Patient Instructions (Addendum)
____________________________________________________________________________________________  Drug Holidays (Slow)  What is a "Drug Holiday"? Drug Holiday: is the name given to the period of time during which a patient stops taking a medication(s) for the purpose of eliminating tolerance to the drug.  Benefits . Improved effectiveness of opioids. . Decreased opioid dose needed to achieve benefits. . Improved pain with lesser dose.  What is tolerance? Tolerance: is the progressive decreased in effectiveness of a drug due to its repetitive use. With repetitive use, the body gets use to the medication and as a consequence, it loses its effectiveness. This is a common problem seen with opioid pain medications. As a result, a larger dose of the drug is needed to achieve the same effect that used to be obtained with a smaller dose.  How long should a "Drug Holiday" last? You should stay off of the pain medicine for at least 14 consecutive days. (2 weeks)  Should I stop the medicine "cold turkey"? No. You should always coordinate with your Pain Specialist so that he/she can provide you with the correct medication dose to make the transition as smoothly as possible.  How do I stop the medicine? Slowly. You will be instructed to decrease the daily amount of pills that you take by one (1) pill every seven (7) days. This is called a "slow downward taper" of your dose. For example: if you normally take four (4) pills per day, you will be asked to drop this dose to three (3) pills per day for seven (7) days, then to two (2) pills per day for seven (7) days, then to one (1) per day for seven (7) days, and at the end of those last seven (7) days, this is when the "Drug Holiday" would start.   Will I have withdrawals? By doing a "slow downward taper" like this one, it is unlikely that you will experience any significant withdrawal symptoms. Typically, what triggers withdrawals is the sudden stop of a high  dose opioid therapy. Withdrawals can usually be avoided by slowly decreasing the dose over a prolonged period of time. If you do not follow these instructions and decide to stop your medication abruptly, withdrawals may be possible.  What are withdrawals? Withdrawals: refers to the wide range of symptoms that occur after stopping or dramatically reducing opiate drugs after heavy and prolonged use. Withdrawal symptoms do not occur to patients that use low dose opioids, or those who take the medication sporadically. Contrary to benzodiazepine (example: Valium, Xanax, etc.) or alcohol withdrawals ("Delirium Tremens"), opioid withdrawals are not lethal. Withdrawals are the physical manifestation of the body getting rid of the excess receptors.  Expected Symptoms Early symptoms of withdrawal may include: . Agitation . Anxiety . Muscle aches . Increased tearing . Insomnia . Runny nose . Sweating . Yawning  Late symptoms of withdrawal may include: . Abdominal cramping . Diarrhea . Dilated pupils . Goose bumps . Nausea . Vomiting  Will I experience withdrawals? Due to the slow nature of the taper, it is very unlikely that you will experience any.  What is a slow taper? Taper: refers to the gradual decrease in dose.  (Last update: 02/25/2020) ____________________________________________________________________________________________    ____________________________________________________________________________________________  Medication Rules  Purpose: To inform patients, and their family members, of our rules and regulations.  Applies to: All patients receiving prescriptions (written or electronic).  Pharmacy of record: Pharmacy where electronic prescriptions will be sent. If written prescriptions are taken to a different pharmacy, please inform the nursing staff. The pharmacy   listed in the electronic medical record should be the one where you would like electronic prescriptions  to be sent.  Electronic prescriptions: In compliance with the [place] Strengthen Opioid Misuse Prevention (STOP) Act of 2017 (Session Law 2017-74/H243), effective August 07, 2018, all controlled substances must be electronically prescribed. Calling prescriptions to the pharmacy will cease to exist.  Prescription refills: Only during scheduled appointments. Applies to all prescriptions.  NOTE: The following applies primarily to controlled substances (Opioid* Pain Medications).   Type of encounter (visit): For patients receiving controlled substances, face-to-face visits are required. (Not an option or up to the patient.)  Patient's responsibilities: 1. Pain Pills: Bring all pain pills to every appointment (except for procedure appointments). 2. Pill Bottles: Bring pills in original pharmacy bottle. Always bring the newest bottle. Bring bottle, even if empty. 3. Medication refills: You are responsible for knowing and keeping track of what medications you take and those you need refilled. The day before your appointment: write a list of all prescriptions that need to be refilled. The day of the appointment: give the list to the admitting nurse. Prescriptions will be written only during appointments. No prescriptions will be written on procedure days. If you forget a medication: it will not be "Called in", "Faxed", or "electronically sent". You will need to get another appointment to get these prescribed. No early refills. Do not call asking to have your prescription filled early. 4. Prescription Accuracy: You are responsible for carefully inspecting your prescriptions before leaving our office. Have the discharge nurse carefully go over each prescription with you, before taking them home. Make sure that your name is accurately spelled, that your address is correct. Check the name and dose of your medication to make sure it is accurate. Check the number of pills, and the written instructions to  make sure they are clear and accurate. Make sure that you are given enough medication to last until your next medication refill appointment. 5. Taking Medication: Take medication as prescribed. When it comes to controlled substances, taking less pills or less frequently than prescribed is permitted and encouraged. Never take more pills than instructed. Never take medication more frequently than prescribed.  6. Inform other Doctors: Always inform, all of your healthcare providers, of all the medications you take. 7. Pain Medication from other Providers: You are not allowed to accept any additional pain medication from any other Doctor or Healthcare provider. There are two exceptions to this rule. (see below) In the event that you require additional pain medication, you are responsible for notifying us, as stated below. 8. Medication Agreement: You are responsible for carefully reading and following our Medication Agreement. This must be signed before receiving any prescriptions from our practice. Safely store a copy of your signed Agreement. Violations to the Agreement will result in no further prescriptions. (Additional copies of our Medication Agreement are available upon request.) 9. Laws, Rules, & Regulations: All patients are expected to follow all Federal and State Laws, Statutes, Rules, & Regulations. Ignorance of the Laws does not constitute a valid excuse.  10. Illegal drugs and Controlled Substances: The use of illegal substances (including, but not limited to marijuana and its derivatives) and/or the illegal use of any controlled substances is strictly prohibited. Violation of this rule may result in the immediate and permanent discontinuation of any and all prescriptions being written by our practice. The use of any illegal substances is prohibited. 11. Adopted CDC guidelines & recommendations: Target dosing levels will be at or   below 60 MME/day. Use of benzodiazepines** is not  recommended.  Exceptions: There are only two exceptions to the rule of not receiving pain medications from other Healthcare Providers. 1. Exception #1 (Emergencies): In the event of an emergency (i.e.: accident requiring emergency care), you are allowed to receive additional pain medication. However, you are responsible for: As soon as you are able, call our office [phone], at any time of the day or night, and leave a message stating your name, the date and nature of the emergency, and the name and dose of the medication prescribed. In the event that your call is answered by a member of our staff, make sure to document and save the date, time, and the name of the person that took your information.  2. Exception #2 (Planned Surgery): In the event that you are scheduled by another doctor or dentist to have any type of surgery or procedure, you are allowed (for a period no longer than 30 days), to receive additional pain medication, for the acute post-op pain. However, in this case, you are responsible for picking up a copy of our "Post-op Pain Management for Surgeons" handout, and giving it to your surgeon or dentist. This document is available at our office, and does not require an appointment to obtain it. Simply go to our office during business hours (Monday-Thursday from 8:00 AM to 4:00 PM) (Friday 8:00 AM to 12:00 Noon) or if you have a scheduled appointment with us, prior to your surgery, and ask for it by name. In addition, you will need to provide us with your name, name of your surgeon, type of surgery, and date of procedure or surgery.  *Opioid medications include: morphine, codeine, oxycodone, oxymorphone, hydrocodone, hydromorphone, meperidine, tramadol, tapentadol, buprenorphine, fentanyl, methadone. **Benzodiazepine medications include: diazepam (Valium), alprazolam (Xanax), clonazepam (Klonopine), lorazepam (Ativan), clorazepate (Tranxene), chlordiazepoxide (Librium), estazolam (Prosom),  oxazepam (Serax), temazepam (Restoril), triazolam (Halcion) (Last updated: 10/04/2017) ____________________________________________________________________________________________   ____________________________________________________________________________________________  Medication Recommendations and Reminders  Applies to: All patients receiving prescriptions (written and/or electronic).  Medication Rules & Regulations: These rules and regulations exist for your safety and that of others. They are not flexible and neither are we. Dismissing or ignoring them will be considered "non-compliance" with medication therapy, resulting in complete and irreversible termination of such therapy. (See document titled "Medication Rules" for more details.) In all conscience, because of safety reasons, we cannot continue providing a therapy where the patient does not follow instructions.  Pharmacy of record:   Definition: This is the pharmacy where your electronic prescriptions will be sent.   We do not endorse any particular pharmacy, however, we have experienced problems with [facility] not securing enough medication supply for the community.  We do not restrict you in your choice of pharmacy. However, once we write for your prescriptions, we will NOT be re-sending more prescriptions to fix restricted supply problems created by your pharmacy, or your insurance.   The pharmacy listed in the electronic medical record should be the one where you want electronic prescriptions to be sent.  If you choose to change pharmacy, simply notify our nursing staff.  Recommendations:  Keep all of your pain medications in a safe place, under lock and key, even if you live alone. We will NOT replace lost, stolen, or damaged medication.  After you fill your prescription, take 1 week's worth of pills and put them away in a safe place. You should keep a separate, properly labeled bottle for this purpose. The remainder    should be kept in the original bottle. Use this as your primary supply, until it runs out. Once it's gone, then you know that you have 1 week's worth of medicine, and it is time to come in for a prescription refill. If you do this correctly, it is unlikely that you will ever run out of medicine.  To make sure that the above recommendation works, it is very important that you make sure your medication refill appointments are scheduled at least 1 week before you run out of medicine. To do this in an effective manner, make sure that you do not leave the office without scheduling your next medication management appointment. Always ask the nursing staff to show you in your prescription , when your medication will be running out. Then arrange for the receptionist to get you a return appointment, at least 7 days before you run out of medicine. Do not wait until you have 1 or 2 pills left, to come in. This is very poor planning and does not take into consideration that we may need to cancel appointments due to bad weather, sickness, or emergencies affecting our staff.  DO NOT ACCEPT A "Partial Fill": If for any reason your pharmacy does not have enough pills/tablets to completely fill or refill your prescription, do not allow for a "partial fill". The law allows the pharmacy to complete that prescription within 72 hours, without requiring a new prescription. If they do not fill the rest of your prescription within those 72 hours, you will need a separate prescription to fill the remaining amount, which we will NOT provide. If the reason for the partial fill is your insurance, you will need to talk to the pharmacist about payment alternatives for the remaining tablets, but again, DO NOT ACCEPT A PARTIAL FILL, unless you can trust your pharmacist to obtain the remainder of the pills within 72 hours.  Prescription refills and/or changes in medication(s):   Prescription refills, and/or changes in dose or medication,  will be conducted only during scheduled medication management appointments. (Applies to both, written and electronic prescriptions.)  No refills on procedure days. No medication will be changed or started on procedure days. No changes, adjustments, and/or refills will be conducted on a procedure day. Doing so will interfere with the diagnostic portion of the procedure.  No phone refills. No medications will be "called into the pharmacy".  No Fax refills.  No weekend refills.  No Holliday refills.  No after hours refills.  Remember:  Business hours are:  Monday to Thursday 8:00 AM to 4:00 PM Provider's Schedule: Jaylyn Booher, MD - Appointments are:  Medication management: Monday and Wednesday 8:00 AM to 4:00 PM Procedure day: Tuesday and Thursday 7:30 AM to 4:00 PM Bilal Lateef, MD - Appointments are:  Medication management: Tuesday and Thursday 8:00 AM to 4:00 PM Procedure day: Monday and Wednesday 7:30 AM to 4:00 PM (Last update: 02/25/2020) ____________________________________________________________________________________________   ____________________________________________________________________________________________  CANNABIDIOL (AKA: CBD Oil or Pills)  Applies to: All patients receiving prescriptions of controlled substances (written and/or electronic).  General Information: Cannabidiol (CBD), a derivative of Marijuana, was discovered in 1940. It is one of some 113 identified cannabinoids in cannabis (Marijuana) plants, accounting for up to 40% of the plant's extract. As of 2018, preliminary clinical research on cannabidiol included studies of anxiety, cognition, movement disorders, and pain.  Cannabidiol is consummed in multiple ways, including inhalation of cannabis smoke or vapor, as an aerosol spray into the cheek, and by mouth. It   may be supplied as CBD oil containing CBD as the active ingredient (no added tetrahydrocannabinol (THC) or terpenes), a full-plant  CBD-dominant hemp extract oil, capsules, dried cannabis, or as a liquid solution. CBD is thought not have the same psychoactivity as THC, and may affect the actions of THC. Studies suggest that CBD may interact with different biological targets, including cannabinoid receptors and other neurotransmitter receptors. As of 2018 the mechanism of action for its biological effects has not been determined.  In the [place], cannabidiol has a limited approval by the Food and Drug Administration (FDA) for treatment of only two types of epilepsy disorders. The side effects of long-term use of the drug include somnolence, decreased appetite, diarrhea, fatigue, malaise, weakness, sleeping problems, and others.  CBD remains a Schedule I drug prohibited for any use.  Legality: Some manufacturers ship CBD products nationally, an illegal action which the FDA has not enforced in 2018, with CBD remaining the subject of an FDA investigational new drug evaluation, and is not considered legal as a dietary supplement or food ingredient as of December 2018. Federal illegality has made it difficult historically to conduct research on CBD. CBD is openly sold in head shops and health food stores in some states where such sales have not been explicitly legalized.  Warning: Because it is not FDA approved for general use or treatment of pain, it is not required to undergo the same manufacturing controls as prescription drugs.  This means that the available cannabidiol (CBD) may be contaminated with THC.  If this is the case, it will trigger a positive urine drug screen (UDS) test for cannabinoids (Marijuana).  Because a positive UDS for illicit substances is a violation of our medication agreement, your opioid analgesics (pain medicine) may be permanently discontinued. (Last update:  02/25/2020) ____________________________________________________________________________________________   ____________________________________________________________________________________________  Preparing for your procedure (without sedation)  Procedure appointments are limited to planned procedures: . No Prescription Refills. . No disability issues will be discussed. . No medication changes will be discussed.  Instructions: . Oral Intake: Do not eat or drink anything for at least 6 hours prior to your procedure. (Exception: Blood Pressure Medication. See below.) . Transportation: Unless otherwise stated by your physician, you may drive yourself after the procedure. . Blood Pressure Medicine: Do not forget to take your blood pressure medicine with a sip of water the morning of the procedure. If your Diastolic (lower reading)is above 100 mmHg, elective cases will be cancelled/rescheduled. . Blood thinners: These will need to be stopped for procedures. Notify our staff if you are taking any blood thinners. Depending on which one you take, there will be specific instructions on how and when to stop it. . Diabetics on insulin: Notify the staff so that you can be scheduled 1st case in the morning. If your diabetes requires high dose insulin, take only  of your normal insulin dose the morning of the procedure and notify the staff that you have done so. . Preventing infections: Shower with an antibacterial soap the morning of your procedure.  . Build-up your immune system: Take 1000 mg of Vitamin C with every meal (3 times a day) the day prior to your procedure. Marland Kitchen Antibiotics: Inform the staff if you have a condition or reason that requires you to take antibiotics before dental procedures. . Pregnancy: If you are pregnant, call and cancel the procedure. . Sickness: If you have a cold, fever, or any active infections, call and cancel the procedure. . Arrival: You must  be in the facility at  least 30 minutes prior to your scheduled procedure. . Children: Do not bring any children with you. . Dress appropriately: Bring dark clothing that you would not mind if they get stained. . Valuables: Do not bring any jewelry or valuables.  Reasons to call and reschedule or cancel your procedure: (Following these recommendations will minimize the risk of a serious complication.) . Surgeries: Avoid having procedures within 2 weeks of any surgery. (Avoid for 2 weeks before or after any surgery). . Flu Shots: Avoid having procedures within 2 weeks of a flu shots or . (Avoid for 2 weeks before or after immunizations). . Barium: Avoid having a procedure within 7-10 days after having had a radiological study involving the use of radiological contrast. (Myelograms, Barium swallow or enema study). . Heart attacks: Avoid any elective procedures or surgeries for the initial 6 months after a "Myocardial Infarction" (Heart Attack). . Blood thinners: It is imperative that you stop these medications before procedures. Let us know if you if you take any blood thinner.  . Infection: Avoid procedures during or within two weeks of an infection (including chest colds or gastrointestinal problems). Symptoms associated with infections include: Localized redness, fever, chills, night sweats or profuse sweating, burning sensation when voiding, cough, congestion, stuffiness, runny nose, sore throat, diarrhea, nausea, vomiting, cold or Flu symptoms, recent or current infections. It is specially important if the infection is over the area that we intend to treat. Marland Kitchen Heart and lung problems: Symptoms that may suggest an active cardiopulmonary problem include: cough, chest pain, breathing difficulties or shortness of breath, dizziness, ankle swelling, uncontrolled high or unusually low blood pressure, and/or palpitations. If you are experiencing any of these symptoms, cancel your procedure and contact your primary care physician for  an evaluation.  Remember:  Regular Business hours are:  Monday to Thursday 8:00 AM to 4:00 PM  Provider's Schedule: [name], MD:  Procedure days: Tuesday and Thursday 7:30 AM to 4:00 PM  Edward Jolly, MD:  Procedure days: Monday and Wednesday 7:30 AM to 4:00 PM ____________________________________________________________________________________________

## 2020-03-02 NOTE — Progress Notes (Signed)
PROVIDER NOTE: Information contained herein reflects review and annotations entered in association with encounter. Interpretation of such information and data should be left to medically-trained personnel. Information provided to patient can be located elsewhere in the medical record under "Patient Instructions". Document created using STT-dictation technology, any transcriptional errors that may result from process are unintentional.    Patient: Mark [name]  Service Category: E/M  Provider: Gaspar Cola, MD  DOB: 19-Mar-1956  DOS: 03/03/2020  Specialty: Interventional Pain Management  MRN: [id number]  Setting: Ambulatory outpatient  PCP: Ria Bush, MD  Type: Established Patient    Referring Provider: Ria Bush, MD  Location: Office  Delivery: Face-to-face     HPI  Reason for encounter: [name], a 64 y.o. year old male, is here today for evaluation and management of his Chronic pain syndrome [G89.4]. [name] primary complain today is Back Pain, Neck Pain, and Shoulder Pain Last encounter: Practice (Visit date not found). My last encounter with him was on Visit date not found. Pertinent problems: 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) (Left); Chronic sacroiliac joint pain (Bilateral); Osteoarthritis of sacroiliac joint (Bilateral); Lumbar facet hypertrophy (L1-2, L2-3, and L4-5) (Bilateral); Lumbar IVDD (intervertebral disc displacement); 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); Lumbar facet syndrome (Bilateral); Osteoarthritis of shoulder (Left);  Osteoarthritis of knee (Bilateral) (R>L); Chronic cervical radicular pain; Chronic fatigue; RLS (restless legs syndrome); Trigger finger of index finger (Right); Cervicalgia; Trigger finger of thumb (Right); Chronic thumb pain (Left); Trigger middle finger of right hand; and Chronic hand pain (Left) on their pertinent problem list. Pain Assessment: Severity of Chronic pain is reported as a 3 /10. Location: Back Upper/radiates into shoulders and neck. Onset: More than a month ago. Quality: Aching, Shooting. Timing: Intermittent. Modifying factor(s): rest, medication. Vitals:  height is 5' 7"  (1.702 m) and weight is 224 lb (101.6 kg) (abnormal). His temporal temperature is 97.2 F (36.2 C) (abnormal). His blood pressure is 125/79 and his pulse is 80. His respiration is 17 and oxygen saturation is 100%.    The patient indicates doing well with the current medication regimen. No adverse reactions or side effects reported to the medications.  The patient indicates that his medications are working perfectly well.  Occasionally he feels that he does not need the entire 10 mg and he will break the tablet in half and that tends to work well.  Today he comes in indicating that lately he has been experiencing pain in the area of the left thumb.  Tinel's sign was negative for de Quervain's.  The pain seems to be at the base of the left thumb.  He also mentioned having some problems with a middle finger and index finger trigger finger flareups, which right now is under good control.  I took the time to teach him some maneuvers on how to compress the cyst that usually causes the trigger finger.  I also informed him that should that fail to work for him, we would be more than glad to some injections in the area.  Today we will go ahead and order some x-rays of the left thumb and schedule him to come back for an injection under fluoroscopic guidance.  Extension of the thumb because this pain in the base, while flexion does not  cause any pain.  This makes me think that it is an issue in the articulation.  The patient also took the opportunity to mention that he has been having some cracking and popping of his right knee which he indicates is not associated with any acute pain.  Occasionally he will give him a little bit of discomfort but it is not constant.  He reminded me that more than 3 years ago we did some Hyalgan knee injections which still are providing him with good relief of the knee pain.  Today we will also refill his Narcan.  Pharmacotherapy Assessment   Analgesic: Oxycodone IR 10 mg, 1 tab PO q 6 hrs (40 mg/day total of oxycodone) MME/day:60 mg/day.   Monitoring: [place] PMP: PDMP reviewed during this encounter.       Pharmacotherapy: No side-effects or adverse reactions reported. Compliance: No problems identified. Effectiveness: Clinically acceptable.  Chauncey Fischer, RN  03/03/2020  1:29 PM  Sign when Signing Visit Nursing Pain Medication Assessment:  Safety precautions to be maintained throughout the outpatient stay will include: orient to surroundings, keep bed in low position, maintain call bell within reach at all times, provide assistance with transfer out of bed and ambulation.  Medication Inspection Compliance: Pill count conducted under aseptic conditions, in front of the patient. Neither the pills nor the bottle was removed from the patient's sight at any time. Once count was completed pills were immediately returned to the patient in their original bottle.  Medication: Oxycodone IR Pill/Patch Count: 36 of 120 pills remain Pill/Patch Appearance: Markings consistent with prescribed medication Bottle Appearance: Standard pharmacy container. Clearly labeled. Filled Date: 7 / 8 / 21 Last Medication intake:  TodaySafety precautions to be maintained throughout the outpatient stay will include: orient to surroundings, keep bed in low position, maintain call bell within reach at all times, provide  assistance with transfer out of bed and ambulation.     UDS:  Summary  Date Value Ref Range Status  01/06/2020 Note  Final    Comment:    ==================================================================== [employer] [facility] (MW) ==================================================================== Test                             Result       Flag       Units Drug Present and Declared for Prescription Verification   Oxycodone                      2773         EXPECTED   ng/mg creat   Noroxycodone                   3993         EXPECTED   ng/mg creat    Sources of oxycodone include scheduled prescription medications.    Noroxycodone is an expected metabolite of oxycodone. ==================================================================== Test                      Result    Flag   Units      Ref Range   Creatinine              44               mg/dL      >=20 ==================================================================== Declared Medications:  The flagging and  interpretation on this report are based on the  following declared medications.  Unexpected results may arise from  inaccuracies in the declared medications.  **Note: The testing scope of this panel includes these medications:  Oxycodone  **Note: The testing scope of this panel does not include the  following reported medications:  Fluoxetine (Prozac)  Fluticasone (Flonase)  Furosemide (Lasix)  Melatonin  Multivitamin  Naloxone (Narcan)  Naproxen  Omega-3 Fatty Acids  Omeprazole (Prilosec)  Potassium  Sildenafil  Supplement  Vitamin E ==================================================================== For clinical consultation, please call [phone]. ====================================================================      ROS  Constitutional: Denies any fever or chills Gastrointestinal: No reported hemesis, hematochezia, vomiting, or acute GI distress Musculoskeletal: Denies any acute onset joint  swelling, redness, loss of ROM, or weakness Neurological: No reported episodes of acute onset apraxia, aphasia, dysarthria, agnosia, amnesia, paralysis, loss of coordination, or loss of consciousness  Medication Review  Cranberry, FLUoxetine, Garlic, Melatonin, Multiple Vitamin, Naproxen, Omega-3, Oxycodone HCl, Saw Palmetto (Serenoa repens), Vitamin B Complex-C, Vitamin D, aspirin, atorvastatin, cetirizine, cyclobenzaprine, desonide, diphenhydrAMINE, docusate sodium, fluticasone, furosemide, naloxone, omeprazole, potassium citrate, rOPINIRole, sildenafil, vitamin C, and vitamin E  History Review  Allergy: [name] is allergic to ciprofloxacin, metformin, buprenorphine hcl, morphine and related, and penicillins. Drug: [name]  reports no history of drug use. Alcohol:  reports no history of alcohol use. Tobacco:  reports that he has never smoked. He has never used smokeless tobacco. Social: Mr. Summer  reports that he has never smoked. He has never used smokeless tobacco. He reports that he does not drink alcohol and does not use drugs. Medical:  has a past medical history of Anxiety, Arthritis, Chronic pain, Depression, Heart murmur, History of kidney stones (April 12,2017), History of permanent cardiac pacemaker placement (05/31/2015), Hydrocele in adult (05/07/2015), Kidney stones, Migraine, MRSA carrier (2019), Presence of permanent cardiac pacemaker, Prostatitis, Sleep apnea, and Sleep apnea. Surgical: [name]  has a past surgical history that includes Tonsillectomy (1964); Carpal tunnel release (Bilateral, 2006); Ulnar nerve repair (2008); Cervical spine surgery (2008); Rotator cuff repair (Left, 2009); pacemaker placement (2009); Lithotripsy (Bilateral, 2012); Lithotripsy (Left, 11/2015); Cardiovascular stress test (2014); Colonoscopy (07/2007); implantable cardioverter defibrillator (icd) generator change (Left, 12/13/2017); and pace maker revision (12/13/2017). Family: family history includes  Arthritis in his father and mother; Cancer in his father and mother; Diabetes in his mother; Heart block in his father; Heart disease in his father and mother.  Laboratory Chemistry Profile   Renal Lab Results  Component Value Date   BUN 25 (H) 08/13/2019   CREATININE 1.03 08/13/2019   GFR 73.03 08/13/2019   GFRAA >60 12/06/2017   GFRNONAA >60 12/06/2017     Hepatic Lab Results  Component Value Date   AST 39 (H) 08/13/2019   ALT 29 08/13/2019   ALBUMIN 4.3 08/13/2019   ALKPHOS 65 08/13/2019   HCVAB NEGATIVE 07/22/2013     Electrolytes Lab Results  Component Value Date   NA 140 08/13/2019   K 3.6 08/13/2019   CL 101 08/13/2019   CALCIUM 9.3 08/13/2019   MG 2.1 09/01/2015     Bone Lab Results  Component Value Date   25OHVITD1 63 02/02/2016   25OHVITD2 <1.0 02/02/2016   25OHVITD3 63 02/02/2016   TESTOSTERONE 216.29 (L) 02/13/2017     Inflammation (CRP: Acute Phase) (ESR: Chronic Phase) Lab Results  Component Value Date   CRP 1.7 (H) 09/01/2015   ESRSEDRATE 6 09/01/2015       Note: Above Lab results  reviewed.  Recent Imaging Review  Abdomen 1 view (KUB) CLINICAL DATA:  History of nephrolithiasis. History of left lithotripsy.  EXAM: ABDOMEN - 1 VIEW  COMPARISON:  09/11/2019.  CT 06/14/2011.  FINDINGS: Stable prominent stones noted bilaterally. Stable pelvic calcifications, most likely phleboliths. No evidence of ureteral stone. No bowel distention. Degenerative change thoracolumbar spine and both hips.  IMPRESSION: Stable bilateral nephrolithiasis.  No evidence of urolithiasis.  Electronically Signed   By: Marcello Moores  Register   On: 10/21/2019 05:52 Note: Reviewed        Physical Exam  General appearance: Well nourished, well developed, and well hydrated. In no apparent acute distress Mental status: Alert, oriented x 3 (person, place, & time)       Respiratory: No evidence of acute respiratory distress Eyes: PERLA Vitals: BP 125/79 (BP Location:  Right Arm, Patient Position: Sitting, Cuff Size: Large)   Pulse 80   Temp (!) 97.2 F (36.2 C) (Temporal)   Resp 17   Ht 5' 7"  (1.702 m)   Wt (!) 224 lb (101.6 kg)   SpO2 100%   BMI 35.08 kg/m  BMI: Estimated body mass index is 35.08 kg/m as calculated from the following:   Height as of this encounter: 5' 7"  (1.702 m).   Weight as of this encounter: 224 lb (101.6 kg). Ideal: Ideal body weight: 66.1 kg (145 lb 11.6 oz) Adjusted ideal body weight: 80.3 kg (177 lb 0.6 oz)  Assessment   Status Diagnosis  Controlled Controlled Controlled 1. Chronic pain syndrome   2. Chronic thumb pain (Left)   3. Chronic hand pain (Left)   4. Opiate use (60 MME/Day)   5. Long term current use of opiate analgesic   6. Pharmacologic therapy   7. Trigger middle finger of right hand      Updated Problems: Problem  Chronic thumb pain (Left)  Trigger Middle Finger of Right Hand  Chronic hand pain (Left)    Plan of Care  Problem-specific:  No problem-specific Assessment & Plan notes found for this encounter.  [name] has a current medication list which includes the following long-term medication(s): atorvastatin, cetirizine, diphenhydramine, fluoxetine, fluoxetine, fluticasone, furosemide, omeprazole, ropinirole, [START ON 03/10/2020] oxycodone hcl, [START ON 04/09/2020] oxycodone hcl, and [START ON 05/09/2020] oxycodone hcl.  Pharmacotherapy (Medications Ordered): Meds ordered this encounter  Medications  . Oxycodone HCl 10 MG TABS    Sig: Take 1 tablet (10 mg total) by mouth every 6 (six) hours as needed. Must last 30 days    Dispense:  120 tablet    Refill:  0    Chronic Pain: STOP Act (Not applicable) Fill 1 day early if closed on refill date. Do not fill until: 03/10/2020. To last until: 04/09/2020. Avoid benzodiazepines within 8 hours of opioids  . Oxycodone HCl 10 MG TABS    Sig: Take 1 tablet (10 mg total) by mouth every 6 (six) hours as needed. Must last 30 days    Dispense:  120  tablet    Refill:  0    Chronic Pain: STOP Act (Not applicable) Fill 1 day early if closed on refill date. Do not fill until: 04/09/2020. To last until: 05/09/2020. Avoid benzodiazepines within 8 hours of opioids  . Oxycodone HCl 10 MG TABS    Sig: Take 1 tablet (10 mg total) by mouth every 6 (six) hours as needed. Must last 30 days    Dispense:  120 tablet    Refill:  0    Chronic  Pain: STOP Act (Not applicable) Fill 1 day early if closed on refill date. Do not fill until: 05/09/2020. To last until: 06/08/2020. Avoid benzodiazepines within 8 hours of opioids  . naloxone (NARCAN) 4 MG/0.1ML LIQD nasal spray kit    Sig: Spray into one nostril. Repeat with second device into other nostril after 2-3 minutes if no or minimal response.    Dispense:  2 each    Refill:  0    Narcan Nasal Spray. (2 pack) Please provide the patient with clear instructions on the use of this device/medication.   Orders:  Orders Placed This Encounter  Procedures  . Small Joint Injection/Arthrocentesis    Level(s): Left thumb injection Laterality: Left Sedation: No Sedation Purpose: Diagnostic Indication(s): Sub-acute pain    Standing Status:   Future    Standing Expiration Date:   06/03/2020    Scheduling Instructions:     Requested Scheduling Timeframe: ASAP  . DG Hand Complete Left    Standing Status:   Future    Standing Expiration Date:   05/04/2020    Order Specific Question:   Reason for Exam (SYMPTOM  OR DIAGNOSIS REQUIRED)    Answer:   Chronic pain at the base of the left thumb    Order Specific Question:   Preferred imaging location?    Answer:   [facility]    Order Specific Question:   Radiology Contrast Protocol - do NOT remove file path    Answer:   [email or web address]   Follow-up plan:   Return for Procedure (no sedation): (L) Thumb inj #1.      Considering:  Diagnostic right hand digit 2 trigger finger injection Therapeutic bilateral IA Hyalgan  knee injections  Diagnostic bilateral cervical facet block  Possible bilateral cervical facet RFA  Diagnostic left sided CESI  Diagnostic bilateral IA knee injection Diagnostic bilateral genicular NB Possible bilateral genicular nerve RFA  Diagnostic bilateral sacroiliac joint block Possible bilateral sacroiliac joint RFA  Diagnostic bilateral IA shoulder jointinjection  Diagnostic bilateral suprascapular NB Possible bilateral suprascapular nerve RFA  Diagnostic bilateral lumbar facet block Possible bilateral lumbar facet RFA  Diagnostic left L4-5 LESI    Palliative PRN treatment(s):  Bilateral intra-articular Hyalgan knee injectionseries (injection #4)  Diagnostic bilateral cervical facetblock  Diagnostic left sided CESI  Diagnostic bilateral IA knee injection Diagnostic bilateral genicular NB Diagnostic bilateral sacroiliac joint block Diagnostic bilateral IA shoulder joint injection  Diagnostic bilateral suprascapularNB  Diagnostic bilateral lumbar facetblock  Diagnostic left L4-5 LESI     Recent Visits No visits were found meeting these conditions. Showing recent visits within past 90 days and meeting all other requirements Today's Visits Date Type Provider Dept  03/03/20 Office Visit Milinda Pointer, MD [facility]  Showing today's visits and meeting all other requirements Future Appointments No visits were found meeting these conditions. Showing future appointments within next 90 days and meeting all other requirements  I discussed the assessment and treatment plan with the patient. The patient was provided an opportunity to ask questions and all were answered. The patient agreed with the plan and demonstrated an understanding of the instructions.  Patient advised to call back or seek an in-person evaluation if the symptoms or condition worsens.  Duration of encounter: 30 minutes.  Note by: Gaspar Cola, MD Date: 03/03/2020; Time:  1:54 PM

## 2020-03-03 ENCOUNTER — Ambulatory Visit (HOSPITAL_BASED_OUTPATIENT_CLINIC_OR_DEPARTMENT_OTHER): Payer: Medicare Other | Admitting: Pain Medicine

## 2020-03-03 ENCOUNTER — Ambulatory Visit
Admission: RE | Admit: 2020-03-03 | Discharge: 2020-03-03 | Disposition: A | Discharge status: Home / Self Care | Payer: Medicare Other | Attending: Pain Medicine | Admitting: Pain Medicine

## 2020-03-03 ENCOUNTER — Ambulatory Visit
Admission: RE | Admit: 2020-03-03 | Discharge: 2020-03-03 | Disposition: A | Discharge status: Home / Self Care | Payer: Medicare Other | Source: Ambulatory Visit | Attending: Pain Medicine | Admitting: Pain Medicine

## 2020-03-03 ENCOUNTER — Other Ambulatory Visit: Payer: Self-pay

## 2020-03-03 ENCOUNTER — Encounter: Payer: Self-pay | Admitting: Pain Medicine

## 2020-03-03 VITALS — BP 125/79 | HR 80 | Temp 97.2°F | Resp 17 | Ht 67.0 in | Wt 224.0 lb

## 2020-03-03 DIAGNOSIS — G8929 Other chronic pain: Secondary | ICD-10-CM | POA: Insufficient documentation

## 2020-03-03 DIAGNOSIS — M79645 Pain in left finger(s): Secondary | ICD-10-CM

## 2020-03-03 DIAGNOSIS — F1190 Opioid use, unspecified, uncomplicated: Secondary | ICD-10-CM

## 2020-03-03 DIAGNOSIS — G894 Chronic pain syndrome: Secondary | ICD-10-CM | POA: Insufficient documentation

## 2020-03-03 DIAGNOSIS — Z79899 Other long term (current) drug therapy: Secondary | ICD-10-CM

## 2020-03-03 DIAGNOSIS — M19042 Primary osteoarthritis, left hand: Secondary | ICD-10-CM | POA: Diagnosis not present

## 2020-03-03 DIAGNOSIS — M65331 Trigger finger, right middle finger: Secondary | ICD-10-CM | POA: Insufficient documentation

## 2020-03-03 DIAGNOSIS — M79642 Pain in left hand: Secondary | ICD-10-CM | POA: Insufficient documentation

## 2020-03-03 DIAGNOSIS — Z79891 Long term (current) use of opiate analgesic: Secondary | ICD-10-CM

## 2020-03-03 DIAGNOSIS — M19032 Primary osteoarthritis, left wrist: Secondary | ICD-10-CM | POA: Diagnosis not present

## 2020-03-03 MED ORDER — OXYCODONE HCL 10 MG PO TABS: 10.0000 mg | ORAL_TABLET | Freq: Four times a day (QID) | ORAL | 0 refills | Status: DC | PRN

## 2020-03-03 MED ORDER — NARCAN 4 MG/0.1ML NA LIQD: NASAL | 0 refills | Status: DC

## 2020-03-03 NOTE — Progress Notes (Signed)
Nursing Pain Medication Assessment:  Safety precautions to be maintained throughout the outpatient stay will include: orient to surroundings, keep bed in low position, maintain call bell within reach at all times, provide assistance with transfer out of bed and ambulation.  Medication Inspection Compliance: Pill count conducted under aseptic conditions, in front of the patient. Neither the pills nor the bottle was removed from the patient's sight at any time. Once count was completed pills were immediately returned to the patient in their original bottle.  Medication: Oxycodone IR Pill/Patch Count: 36 of 120 pills remain Pill/Patch Appearance: Markings consistent with prescribed medication Bottle Appearance: Standard pharmacy container. Clearly labeled. Filled Date: 7 / 8 / 21 Last Medication intake:  TodaySafety precautions to be maintained throughout the outpatient stay will include: orient to surroundings, keep bed in low position, maintain call bell within reach at all times, provide assistance with transfer out of bed and ambulation.

## 2020-03-04 ENCOUNTER — Ambulatory Visit: Payer: Medicare Other | Admitting: Pain Medicine

## 2020-03-11 ENCOUNTER — Ambulatory Visit
Admission: RE | Admit: 2020-03-11 | Discharge: 2020-03-11 | Disposition: A | Discharge status: Home / Self Care | Payer: Medicare Other | Source: Ambulatory Visit | Attending: Pain Medicine | Admitting: Pain Medicine

## 2020-03-11 ENCOUNTER — Other Ambulatory Visit: Payer: Self-pay

## 2020-03-11 ENCOUNTER — Encounter: Payer: Self-pay | Admitting: Pain Medicine

## 2020-03-11 ENCOUNTER — Ambulatory Visit (HOSPITAL_BASED_OUTPATIENT_CLINIC_OR_DEPARTMENT_OTHER): Payer: Medicare Other | Admitting: Pain Medicine

## 2020-03-11 VITALS — BP 142/82 | HR 68 | Temp 98.3°F | Resp 15 | Ht 67.0 in | Wt 224.0 lb

## 2020-03-11 DIAGNOSIS — M1812 Unilateral primary osteoarthritis of first carpometacarpal joint, left hand: Secondary | ICD-10-CM | POA: Insufficient documentation

## 2020-03-11 DIAGNOSIS — M79645 Pain in left finger(s): Secondary | ICD-10-CM | POA: Diagnosis not present

## 2020-03-11 DIAGNOSIS — G8929 Other chronic pain: Secondary | ICD-10-CM | POA: Diagnosis not present

## 2020-03-11 DIAGNOSIS — M79642 Pain in left hand: Secondary | ICD-10-CM | POA: Diagnosis not present

## 2020-03-11 MED ORDER — ROPIVACAINE HCL 2 MG/ML IJ SOLN
1.0000 mL | Freq: Once | INTRAMUSCULAR | Status: AC
  Administered 2020-03-11: 1 mL
  Filled 2020-03-11: qty 10

## 2020-03-11 MED ORDER — LIDOCAINE HCL 2 % IJ SOLN
20.0000 mL | Freq: Once | INTRAMUSCULAR | Status: AC
  Administered 2020-03-11: 400 mg

## 2020-03-11 MED ORDER — METHYLPREDNISOLONE ACETATE 80 MG/ML IJ SUSP
80.0000 mg | Freq: Once | INTRAMUSCULAR | Status: AC
  Administered 2020-03-11: 80 mg
  Filled 2020-03-11: qty 1

## 2020-03-11 NOTE — Patient Instructions (Addendum)
____________________________________________________________________________________________  Post-Procedure Discharge Instructions  Instructions:  Apply ice:   Purpose: This will minimize any swelling and discomfort after procedure.   When: Day of procedure, as soon as you get home.  How: Fill a plastic sandwich bag with crushed ice. Cover it with a small towel and apply to injection site.  How long: (15 min on, 15 min off) Apply for 15 minutes then remove x 15 minutes.  Repeat sequence on day of procedure, until you go to bed.  Apply heat:   Purpose: To treat any soreness and discomfort from the procedure.  When: Starting the next day after the procedure.  How: Apply heat to procedure site starting the day following the procedure.  How long: May continue to repeat daily, until discomfort goes away.  Food intake: Start with clear liquids (like water) and advance to regular food, as tolerated.   Physical activities: Keep activities to a minimum for the first 8 hours after the procedure. After that, then as tolerated.  Driving: If you have received any sedation, be responsible and do not drive. You are not allowed to drive for 24 hours after having sedation.  Blood thinner: (Applies only to those taking blood thinners) You may restart your blood thinner 6 hours after your procedure.  Insulin: (Applies only to Diabetic patients taking insulin) As soon as you can eat, you may resume your normal dosing schedule.  Infection prevention: Keep procedure site clean and dry. Shower daily and clean area with soap and water.  Post-procedure Pain Diary: Extremely important that this be done correctly and accurately. Recorded information will be used to determine the next step in treatment. For the purpose of accuracy, follow these rules:  Evaluate only the area treated. Do not report or include pain from an untreated area. For the purpose of this evaluation, ignore all other areas of pain,  except for the treated area.  After your procedure, avoid taking a long nap and attempting to complete the pain diary after you wake up. Instead, set your alarm clock to go off every hour, on the hour, for the initial 8 hours after the procedure. Document the duration of the numbing medicine, and the relief you are getting from it.  Do not go to sleep and attempt to complete it later. It will not be accurate. If you received sedation, it is likely that you were given a medication that may cause amnesia. Because of this, completing the diary at a later time may cause the information to be inaccurate. This information is needed to plan your care.  Follow-up appointment: Keep your post-procedure follow-up evaluation appointment after the procedure (usually 2 weeks for most procedures, 6 weeks for radiofrequencies). DO NOT FORGET to bring you pain diary with you.   Expect: (What should I expect to see with my procedure?)  From numbing medicine (AKA: Local Anesthetics): Numbness or decrease in pain. You may also experience some weakness, which if present, could last for the duration of the local anesthetic.  Onset: Full effect within 15 minutes of injected.  Duration: It will depend on the type of local anesthetic used. On the average, 1 to 8 hours.   From steroids (Applies only if steroids were used): Decrease in swelling or inflammation. Once inflammation is improved, relief of the pain will follow.  Onset of benefits: Depends on the amount of swelling present. The more swelling, the longer it will take for the benefits to be seen. In some cases, up to 10 days.    Duration: Steroids will stay in the system x 2 weeks. Duration of benefits will depend on multiple posibilities including persistent irritating factors.  Side-effects: If present, they may typically last 2 weeks (the duration of the steroids).  Frequent: Cramps (if they occur, drink Gatorade and take over-the-counter Magnesium 450-500 mg  once to twice a day); water retention with temporary weight gain; increases in blood sugar; decreased immune system response; increased appetite.  Occasional: Facial flushing (red, warm cheeks); mood swings; menstrual changes.  Uncommon: Long-term decrease or suppression of natural hormones; bone thinning. (These are more common with higher doses or more frequent use. This is why we prefer that our patients avoid having any injection therapies in other practices.)   Very Rare: Severe mood changes; psychosis; aseptic necrosis.  From procedure: Some discomfort is to be expected once the numbing medicine wears off. This should be minimal if ice and heat are applied as instructed.  Call if: (When should I call?)  You experience numbness and weakness that gets worse with time, as opposed to wearing off.  New onset bowel or bladder incontinence. (Applies only to procedures done in the spine)  Emergency Numbers:  Durning business hours (Monday - Thursday, 8:00 AM - 4:00 PM) (Friday, 9:00 AM - 12:00 Noon): [phone]  After hours: [phone]  NOTE: If you are having a problem and are unable connect with, or to talk to a provider, then go to your nearest urgent care or emergency department. If the problem is serious and urgent, please call 911. ____________________________________________________________________________________________   ____________________________________________________________________________________________  Preparing for your procedure (without sedation)  Procedure appointments are limited to planned procedures: . No Prescription Refills. . No disability issues will be discussed. . No medication changes will be discussed.  Instructions: . Oral Intake: Do not eat or drink anything for at least 6 hours prior to your procedure. (Exception: Blood Pressure Medication. See below.) . Transportation: Unless otherwise stated by your physician, you may drive yourself  after the procedure. . Blood Pressure Medicine: Do not forget to take your blood pressure medicine with a sip of water the morning of the procedure. If your Diastolic (lower reading)is above 100 mmHg, elective cases will be cancelled/rescheduled. . Blood thinners: These will need to be stopped for procedures. Notify our staff if you are taking any blood thinners. Depending on which one you take, there will be specific instructions on how and when to stop it. . Diabetics on insulin: Notify the staff so that you can be scheduled 1st case in the morning. If your diabetes requires high dose insulin, take only  of your normal insulin dose the morning of the procedure and notify the staff that you have done so. . Preventing infections: Shower with an antibacterial soap the morning of your procedure.  . Build-up your immune system: Take 1000 mg of Vitamin C with every meal (3 times a day) the day prior to your procedure. . Antibiotics: Inform the staff if you have a condition or reason that requires you to take antibiotics before dental procedures. . Pregnancy: If you are pregnant, call and cancel the procedure. . Sickness: If you have a cold, fever, or any active infections, call and cancel the procedure. . Arrival: You must be in the facility at least 30 minutes prior to your scheduled procedure. . Children: Do not bring any children with you. . Dress appropriately: Bring dark clothing that you would not mind if they get stained. . Valuables: Do not bring any jewelry   or valuables.  Reasons to call and reschedule or cancel your procedure: (Following these recommendations will minimize the risk of a serious complication.) . Surgeries: Avoid having procedures within 2 weeks of any surgery. (Avoid for 2 weeks before or after any surgery). . Flu Shots: Avoid having procedures within 2 weeks of a flu shots or . (Avoid for 2 weeks before or after immunizations). . Barium: Avoid having a procedure within 7-10  days after having had a radiological study involving the use of radiological contrast. (Myelograms, Barium swallow or enema study). . Heart attacks: Avoid any elective procedures or surgeries for the initial 6 months after a "Myocardial Infarction" (Heart Attack). . Blood thinners: It is imperative that you stop these medications before procedures. Let us know if you if you take any blood thinner.  . Infection: Avoid procedures during or within two weeks of an infection (including chest colds or gastrointestinal problems). Symptoms associated with infections include: Localized redness, fever, chills, night sweats or profuse sweating, burning sensation when voiding, cough, congestion, stuffiness, runny nose, sore throat, diarrhea, nausea, vomiting, cold or Flu symptoms, recent or current infections. It is specially important if the infection is over the area that we intend to treat. . Heart and lung problems: Symptoms that may suggest an active cardiopulmonary problem include: cough, chest pain, breathing difficulties or shortness of breath, dizziness, ankle swelling, uncontrolled high or unusually low blood pressure, and/or palpitations. If you are experiencing any of these symptoms, cancel your procedure and contact your primary care physician for an evaluation.  Remember:  Regular Business hours are:  Monday to Thursday 8:00 AM to 4:00 PM  Provider's Schedule: Lezette Kitts, MD:  Procedure days: Tuesday and Thursday 7:30 AM to 4:00 PM  Bilal Lateef, MD:  Procedure days: Monday and Wednesday 7:30 AM to 4:00 PM ____________________________________________________________________________________________    

## 2020-03-11 NOTE — Progress Notes (Signed)
PROVIDER NOTE: Information contained herein reflects review and annotations entered in association with encounter. Interpretation of such information and data should be left to medically-trained personnel. Information provided to patient can be located elsewhere in the medical record under "Patient Instructions". Document created using STT-dictation technology, any transcriptional errors that may result from process are unintentional.    Patient: Mark [name]  Service Category: Procedure  Provider: Oswaldo Done, MD  DOB: 1956-02-13  DOS: 03/11/2020  Location: [facility]  MRN: [id number]  Setting: Ambulatory - outpatient  Referring Provider: [name], MD  Type: Established Patient  Specialty: Interventional Pain Management  PCP: [name], MD   Primary Reason for Visit: Interventional Pain Management Treatment. CC: Hand Pain (left thumb)  Procedure:          Anesthesia, Analgesia, Anxiolysis:  Type: Therapeutic Small Joint (CPT 20600) Steroid Injection  #1  Laterality: Left  Type: Local Anesthesia Indication(s): Analgesia         Local Anesthetic: Lidocaine 1-2% Route: Infiltration ([place]/IM) IV Access: Declined Sedation: Declined   Patient position: Sitting Extremity position: Supine   Target Area: Posterolateral aspect Target: CMC (Carpometacarpal) Interspace between [facility] and Trapezium Approach: Lateral approach.   Indications: 1. Osteoarthritis of first carpometacarpal joint (thumb) of hand (Left)   2. Chronic thumb pain (Left)   3. Chronic hand pain (Left)    Pain Score: Pre-procedure: 2 /10 Post-procedure: 2 /10   Today he comes in describing having some pain in the area of the thumb as well as some pain in his scapular region towards the lower inner border.  Physical exam demonstrated a trigger point in the area.  I will be bringing him back at a later time to do a trigger point injection.  Today we will concentrate on doing an injection in  his left first carpometacarpal joint.  Pre-op Assessment:  Mark [name] is a 64 y.o. (year old), male patient, seen today for interventional treatment. He  has a past surgical history that includes Tonsillectomy (1964); Carpal tunnel release (Bilateral, 2006); Ulnar nerve repair (2008); Cervical spine surgery (2008); Rotator cuff repair (Left, 2009); pacemaker placement (2009); Lithotripsy (Bilateral, 2012); Lithotripsy (Left, 11/2015); Cardiovascular stress test (2014); Colonoscopy (07/2007); implantable cardioverter defibrillator (icd) generator change (Left, 12/13/2017); and pace maker revision (12/13/2017). Mark [name] has a current medication list which includes the following prescription(s): vitamin c, aspirin, atorvastatin, cetirizine, vitamin d, cranberry, cyclobenzaprine, desonide, diphenhydramine, docusate sodium, fluoxetine, fluoxetine, fluticasone, furosemide, garlic, melatonin, multiple vitamin, certavite senior/antioxidant, narcan, naproxen, omega-3, omeprazole, oxycodone hcl, [START ON 04/09/2020] oxycodone hcl, [START ON 05/09/2020] oxycodone hcl, potassium citrate, ropinirole, saw palmetto (serenoa repens), sildenafil, vitamin b complex-c, vitamin e, and fluoxetine hcl (pmdd). His primarily concern today is the Hand Pain (left thumb)  Initial Vital Signs:  Pulse/HCG Rate: 69  Temp: (!) 97 F (36.1 C) Resp: 18 BP: 131/82 SpO2: 99 %  BMI: Estimated body mass index is 35.08 kg/m as calculated from the following:   Height as of this encounter: 5\' 7"  (1.702 m).   Weight as of this encounter: 224 lb (101.6 kg).  Risk Assessment: Allergies: Reviewed. He is allergic to ciprofloxacin, metformin, buprenorphine hcl, morphine and related, and penicillins.  Allergy Precautions: None required Coagulopathies: Reviewed. None identified.  Blood-thinner therapy: None at this time Active Infection(s): Reviewed. None identified. Mark [name] is afebrile  Site Confirmation: Mark [name] was asked to confirm the  procedure and laterality before marking the site Procedure checklist: Completed Consent: Before the procedure and under  the influence of no sedative(s), amnesic(s), or anxiolytics, the patient was informed of the treatment options, risks and possible complications. To fulfill our ethical and legal obligations, as recommended by the American Medical Association's Code of Ethics, I have informed the patient of my clinical impression; the nature and purpose of the treatment or procedure; the risks, benefits, and possible complications of the intervention; the alternatives, including doing nothing; the risk(s) and benefit(s) of the alternative treatment(s) or procedure(s); and the risk(s) and benefit(s) of doing nothing. The patient was provided information about the general risks and possible complications associated with the procedure. These may include, but are not limited to: failure to achieve desired goals, infection, bleeding, organ or nerve damage, allergic reactions, paralysis, and death. In addition, the patient was informed of those risks and complications associated to the procedure, such as failure to decrease pain; infection; bleeding; organ or nerve damage with subsequent damage to sensory, motor, and/or autonomic systems, resulting in permanent pain, numbness, and/or weakness of one or several areas of the body; allergic reactions; (i.e.: anaphylactic reaction); and/or death. Furthermore, the patient was informed of those risks and complications associated with the medications. These include, but are not limited to: allergic reactions (i.e.: anaphylactic or anaphylactoid reaction(s)); adrenal axis suppression; blood sugar elevation that in diabetics may result in ketoacidosis or comma; water retention that in patients with history of congestive heart failure may result in shortness of breath, pulmonary edema, and decompensation with resultant heart failure; weight gain; swelling or edema;  medication-induced neural toxicity; particulate matter embolism and blood vessel occlusion with resultant organ, and/or nervous system infarction; and/or aseptic necrosis of one or more joints. Finally, the patient was informed that Medicine is not an exact science; therefore, there is also the possibility of unforeseen or unpredictable risks and/or possible complications that may result in a catastrophic outcome. The patient indicated having understood very clearly. We have given the patient no guarantees and we have made no promises. Enough time was given to the patient to ask questions, all of which were answered to the patient's satisfaction. Mr. Leeds has indicated that he wanted to continue with the procedure. Attestation: I, the ordering provider, attest that I have discussed with the patient the benefits, risks, side-effects, alternatives, likelihood of achieving goals, and potential problems during recovery for the procedure that I have provided informed consent. Date  Time: 03/11/2020 12:20 PM  Pre-Procedure Preparation:  Monitoring: As per clinic protocol. Respiration, ETCO2, SpO2, BP, heart rate and rhythm monitor placed and checked for adequate function Safety Precautions: Patient was assessed for positional comfort and pressure points before starting the procedure. Time-out: I initiated and conducted the "Time-out" before starting the procedure, as per protocol. The patient was asked to participate by confirming the accuracy of the "Time Out" information. Verification of the correct person, site, and procedure were performed and confirmed by me, the nursing staff, and the patient. "Time-out" conducted as per Joint Commission's Universal Protocol (UP.01.01.01). Time: 1305  Description of Procedure:          Area Prepped: Entire hand and wrist area DuraPrep (Iodine Povacrylex [0.7% available iodine] and Isopropyl Alcohol, 74% w/w) Safety Precautions: Aspiration looking for blood return was  conducted prior to all injections. At no point did we inject any substances, as a needle was being advanced. No attempts were made at seeking any paresthesias. Safe injection practices and needle disposal techniques used. Medications properly checked for expiration dates. SDV (single dose vial) medications used. Description of the Procedure:  Protocol guidelines were followed. The patient was placed in position. The target area was identified and the area prepped in the usual manner. Skin & deeper tissues infiltrated with local anesthetic. Appropriate amount of time allowed to pass for local anesthetics to take effect. The procedure needles were then advanced to the target area. Proper needle placement secured. Negative aspiration confirmed. Solution injected in intermittent fashion, asking for systemic symptoms every 0.5cc of injectate. The needles were then removed and the area cleansed, making sure to leave some of the prepping solution back to take advantage of its long term bactericidal properties.      Vitals:   03/11/20 1218 03/11/20 1220 03/11/20 1300 03/11/20 1310  BP:  131/82 (!) 144/84 (!) 142/82  Pulse:  69 70 68  Resp:  18 16 15   Temp: (!) 97 F (36.1 C) 98.3 F (36.8 C)    SpO2:  99% 98% 97%  Weight: 224 lb (101.6 kg)     Height: 5\' 7"  (1.702 m)       Start Time: 1305 hrs. End Time: 1309 hrs. Materials:  Needle(s) Type: Spinal Needle Gauge: 22G Length: 1.5-in Medication(s): Please see orders for medications and dosing details.  Imaging Guidance:          Type of Imaging Technique: Fluoroscopy Guidance (Non-spinal) Indication(s): Assistance in needle guidance and placement for procedures requiring needle placement in or near specific anatomical locations not easily accessible without such assistance. Exposure Time: Please see nurses notes. Contrast: None used. Fluoroscopic Guidance: I was personally present during the use of fluoroscopy. "Tunnel Vision Technique" used to  obtain the best possible view of the target area. Parallax error corrected before commencing the procedure. "Direction-depth-direction" technique used to introduce the needle under continuous pulsed fluoroscopy. Once target was reached, antero-posterior, oblique, and lateral fluoroscopic projection used confirm needle placement in all planes. Images permanently stored in EMR. Ultrasound Guidance: N/A Interpretation: No contrast injected. I personally interpreted the imaging intraoperatively. Adequate needle placement confirmed in multiple planes. Permanent images saved into the patient's record.  Antibiotic Prophylaxis:   Anti-infectives (From admission, onward)   None     Indication(s): None identified  Post-operative Assessment:  Post-procedure Vital Signs:  Pulse/HCG Rate: 68  Temp: 98.3 F (36.8 C) Resp: 15 BP: (!) 142/82 SpO2: 97 %  EBL: None  Complications: No immediate post-treatment complications observed by team, or reported by patient.  Note: The patient tolerated the entire procedure well. A repeat set of vitals were taken after the procedure and the patient was kept under observation following institutional policy, for this type of procedure. Post-procedural neurological assessment was performed, showing return to baseline, prior to discharge. The patient was provided with post-procedure discharge instructions, including a section on how to identify potential problems. Should any problems arise concerning this procedure, the patient was given instructions to immediately contact us, at any time, without hesitation. In any case, we plan to contact the patient by telephone for a follow-up status report regarding this interventional procedure.  Comments:  No additional relevant information.  Plan of Care  Orders:  Orders Placed This Encounter  Procedures  . Small Joint Injection/Arthrocentesis    Level(s): Left hand, first finger (thumb), carpometacarpal joint Laterality:  Left Sedation: No Sedation Purpose: Therapeutic Indication(s): Sub-acute pain    Scheduling Instructions:     Requested Scheduling Timeframe: Today  . [facility] PA[facility] C-ARM 1-60 MIN NO REPORT    Intraoperative interpretation by procedural physician at [facility].    Standing  Status:   Standing    Number of Occurrences:   1    Order Specific Question:   Reason for exam:    Answer:   Assistance in needle guidance and placement for procedures requiring needle placement in or near specific anatomical locations not easily accessible without such assistance.  . Informed Consent Details: Physician/Practitioner Attestation; Transcribe to consent form and obtain patient signature    Provider Attestation: I, Marcelina Mclaurin A. Laban Emperor, MD, (Pain Management Specialist), the physician/practitioner, attest that I have discussed with the patient the benefits, risks, side effects, alternatives, likelihood of achieving goals and potential problems during recovery for the procedure that I have provided informed consent.    Scheduling Instructions:     Procedure: Left hand, first finger (thumb), carpometacarpal joint injection     Indication/Reason: Chronic left thumb pain, primary osteoarthritis of the left, first finger, carpometacarpal joint     Note: Always confirm laterality of pain with Mr. Szabo, before procedure.  . Provide equipment / supplies at bedside    Equipment required: Single use, disposable, "Block Tray"    Standing Status:   Standing    Number of Occurrences:   1    Order Specific Question:   Specify    Answer:   Block Tray   Chronic Opioid Analgesic:  Oxycodone IR 10 mg, 1 tab PO q 6 hrs (40 mg/day total of oxycodone) MME/day:60 mg/day.   Medications ordered for procedure: Meds ordered this encounter  Medications  . lidocaine (XYLOCAINE) 2 % (with pres) injection 400 mg  . methylPREDNISolone acetate (DEPO-MEDROL) injection 80 mg  . ropivacaine (PF) 2 mg/mL (0.2%) (NAROPIN)  injection 1 mL   Medications administered: We administered lidocaine, methylPREDNISolone acetate, and ropivacaine (PF) 2 mg/mL (0.2%).  See the medical record for exact dosing, route, and time of administration.  Follow-up plan:   Return for F2F encounter, 20-min, PP-(on eval day), in addition: (L) Scapular TPI #1.       Considering:  Diagnostic right hand digit 2 trigger finger injection Therapeutic bilateral IA Hyalgan knee injections  Diagnostic bilateral cervical facet block  Possible bilateral cervical facet RFA  Diagnostic left sided CESI  Diagnostic bilateral IA knee injection Diagnostic bilateral genicular NB Possible bilateral genicular nerve RFA  Diagnostic bilateral sacroiliac joint block Possible bilateral sacroiliac joint RFA  Diagnostic bilateral IA shoulder jointinjection  Diagnostic bilateral suprascapular NB Possible bilateral suprascapular nerve RFA  Diagnostic bilateral lumbar facet block Possible bilateral lumbar facet RFA  Diagnostic left L4-5 LESI    Palliative PRN treatment(s):  Bilateral intra-articular Hyalgan knee injectionseries (injection #4)  Diagnostic bilateral cervical facetblock  Diagnostic left sided CESI  Diagnostic bilateral IA knee injection Diagnostic bilateral genicular NB Diagnostic bilateral sacroiliac joint block Diagnostic bilateral IA shoulder joint injection  Diagnostic bilateral suprascapularNB  Diagnostic bilateral lumbar facetblock  Diagnostic left L4-5 LESI    Recent Visits Date Type Provider Dept  03/03/20 Office Visit [name], MD [facility]  Showing recent visits within past 90 days and meeting all other requirements Today's Visits Date Type Provider Dept  03/11/20 Procedure visit [name], MD [facility]  Showing today's visits and meeting all other requirements Future Appointments Date Type Provider Dept  03/25/20 Appointment [name], MD [facility]  Showing future appointments within next 90 days and meeting all other requirements  Disposition: Discharge home  Discharge (Date  Time): 03/11/2020; 1350 hrs.   Primary Care Physician: [name], MD Location: [facility] Note  by: Oswaldo Done, MD Date: 03/11/2020; Time: 2:18 PM  Disclaimer:  Medicine is not an Visual merchandiser. The only guarantee in medicine is that nothing is guaranteed. It is important to note that the decision to proceed with this intervention was based on the information collected from the patient. The Data and conclusions were drawn from the patient's questionnaire, the interview, and the physical examination. Because the information was provided in large part by the patient, it cannot be guaranteed that it has not been purposely or unconsciously manipulated. Every effort has been made to obtain as much relevant data as possible for this evaluation. It is important to note that the conclusions that lead to this procedure are derived in large part from the available data. Always take into account that the treatment will also be dependent on availability of resources and existing treatment guidelines, considered by other Pain Management Practitioners as being common knowledge and practice, at the time of the intervention. For Medico-Legal purposes, it is also important to point out that variation in procedural techniques and pharmacological choices are the acceptable norm. The indications, contraindications, technique, and results of the above procedure should only be interpreted and judged by a Board-Certified Interventional Pain Specialist with extensive familiarity and expertise in the same exact procedure and technique.

## 2020-03-11 NOTE — Progress Notes (Signed)
Safety precautions to be maintained throughout the outpatient stay will include: orient to surroundings, keep bed in low position, maintain call bell within reach at all times, provide assistance with transfer out of bed and ambulation.  

## 2020-03-12 ENCOUNTER — Telehealth: Payer: Self-pay

## 2020-03-12 NOTE — Telephone Encounter (Signed)
Post procedure phone call.  Patient states he is doing good.  

## 2020-03-25 ENCOUNTER — Ambulatory Visit: Payer: Medicare Other | Admitting: Pain Medicine

## 2020-03-30 ENCOUNTER — Ambulatory Visit: Payer: Medicare Other | Attending: Pain Medicine | Admitting: Pain Medicine

## 2020-03-30 ENCOUNTER — Encounter: Payer: Self-pay | Admitting: Pain Medicine

## 2020-03-30 ENCOUNTER — Other Ambulatory Visit: Payer: Self-pay

## 2020-03-30 VITALS — BP 159/74 | HR 70 | Temp 97.7°F | Resp 18 | Ht 67.0 in | Wt 228.0 lb

## 2020-03-30 DIAGNOSIS — M25512 Pain in left shoulder: Secondary | ICD-10-CM | POA: Diagnosis not present

## 2020-03-30 DIAGNOSIS — M549 Dorsalgia, unspecified: Secondary | ICD-10-CM

## 2020-03-30 MED ORDER — TRIAMCINOLONE ACETONIDE 40 MG/ML IJ SUSP
INTRAMUSCULAR | Status: AC
  Filled 2020-03-30: qty 1

## 2020-03-30 MED ORDER — ROPIVACAINE HCL 2 MG/ML IJ SOLN
INTRAMUSCULAR | Status: AC
  Filled 2020-03-30: qty 10

## 2020-03-30 MED ORDER — ROPIVACAINE HCL 2 MG/ML IJ SOLN
9.0000 mL | Freq: Once | INTRAMUSCULAR | Status: AC
  Administered 2020-03-30: 9 mL

## 2020-03-30 MED ORDER — TRIAMCINOLONE ACETONIDE 40 MG/ML IJ SUSP
40.0000 mg | Freq: Once | INTRAMUSCULAR | Status: AC
  Administered 2020-03-30: 40 mg

## 2020-03-30 NOTE — Progress Notes (Signed)
Safety precautions to be maintained throughout the outpatient stay will include: orient to surroundings, keep bed in low position, maintain call bell within reach at all times, provide assistance with transfer out of bed and ambulation.  

## 2020-03-30 NOTE — Patient Instructions (Addendum)
____________________________________________________________________________________________  Post-Procedure Discharge Instructions  Instructions:  Apply ice:   Purpose: This will minimize any swelling and discomfort after procedure.   When: Day of procedure, as soon as you get home.  How: Fill a plastic sandwich bag with crushed ice. Cover it with a small towel and apply to injection site.  How long: (15 min on, 15 min off) Apply for 15 minutes then remove x 15 minutes.  Repeat sequence on day of procedure, until you go to bed.  Apply heat:   Purpose: To treat any soreness and discomfort from the procedure.  When: Starting the next day after the procedure.  How: Apply heat to procedure site starting the day following the procedure.  How long: May continue to repeat daily, until discomfort goes away.  Food intake: Start with clear liquids (like water) and advance to regular food, as tolerated.   Physical activities: Keep activities to a minimum for the first 8 hours after the procedure. After that, then as tolerated.  Driving: If you have received any sedation, be responsible and do not drive. You are not allowed to drive for 24 hours after having sedation.  Blood thinner: (Applies only to those taking blood thinners) You may restart your blood thinner 6 hours after your procedure.  Insulin: (Applies only to Diabetic patients taking insulin) As soon as you can eat, you may resume your normal dosing schedule.  Infection prevention: Keep procedure site clean and dry. Shower daily and clean area with soap and water.  Post-procedure Pain Diary: Extremely important that this be done correctly and accurately. Recorded information will be used to determine the next step in treatment. For the purpose of accuracy, follow these rules:  Evaluate only the area treated. Do not report or include pain from an untreated area. For the purpose of this evaluation, ignore all other areas of pain,  except for the treated area.  After your procedure, avoid taking a long nap and attempting to complete the pain diary after you wake up. Instead, set your alarm clock to go off every hour, on the hour, for the initial 8 hours after the procedure. Document the duration of the numbing medicine, and the relief you are getting from it.  Do not go to sleep and attempt to complete it later. It will not be accurate. If you received sedation, it is likely that you were given a medication that may cause amnesia. Because of this, completing the diary at a later time may cause the information to be inaccurate. This information is needed to plan your care.  Follow-up appointment: Keep your post-procedure follow-up evaluation appointment after the procedure (usually 2 weeks for most procedures, 6 weeks for radiofrequencies). DO NOT FORGET to bring you pain diary with you.   Expect: (What should I expect to see with my procedure?)  From numbing medicine (AKA: Local Anesthetics): Numbness or decrease in pain. You may also experience some weakness, which if present, could last for the duration of the local anesthetic.  Onset: Full effect within 15 minutes of injected.  Duration: It will depend on the type of local anesthetic used. On the average, 1 to 8 hours.   From steroids (Applies only if steroids were used): Decrease in swelling or inflammation. Once inflammation is improved, relief of the pain will follow.  Onset of benefits: Depends on the amount of swelling present. The more swelling, the longer it will take for the benefits to be seen. In some cases, up to 10 days.    Duration: Steroids will stay in the system x 2 weeks. Duration of benefits will depend on multiple posibilities including persistent irritating factors.  Side-effects: If present, they may typically last 2 weeks (the duration of the steroids).  Frequent: Cramps (if they occur, drink Gatorade and take over-the-counter Magnesium 450-500 mg  once to twice a day); water retention with temporary weight gain; increases in blood sugar; decreased immune system response; increased appetite.  Occasional: Facial flushing (red, warm cheeks); mood swings; menstrual changes.  Uncommon: Long-term decrease or suppression of natural hormones; bone thinning. (These are more common with higher doses or more frequent use. This is why we prefer that our patients avoid having any injection therapies in other practices.)   Very Rare: Severe mood changes; psychosis; aseptic necrosis.  From procedure: Some discomfort is to be expected once the numbing medicine wears off. This should be minimal if ice and heat are applied as instructed.  Call if: (When should I call?)  You experience numbness and weakness that gets worse with time, as opposed to wearing off.  New onset bowel or bladder incontinence. (Applies only to procedures done in the spine)  Emergency Numbers:  Durning business hours (Monday - Thursday, 8:00 AM - 4:00 PM) (Friday, 9:00 AM - 12:00 Noon): [phone]  After hours: [phone]  NOTE: If you are having a problem and are unable connect with, or to talk to a provider, then go to your nearest urgent care or emergency department. If the problem is serious and urgent, please call 911. ____________________________________________________________________________________________   Pain Management Discharge Instructions  General Discharge Instructions :  If you need to reach your doctor call: Monday-Friday 8:00 am - 4:00 pm at [phone] or toll free [phone].  After clinic hours [phone] to have operator reach doctor.  Bring all of your medication bottles to all your appointments in the pain clinic.  To cancel or reschedule your appointment with Pain Management please remember to call 24 hours in advance to avoid a fee.  Refer to the educational materials which you have been given on: General Risks, I had my  Procedure. Discharge Instructions, Post Sedation.  Post Procedure Instructions:  The drugs you were given will stay in your system until tomorrow, so for the next 24 hours you should not drive, make any legal decisions or drink any alcoholic beverages.  You may eat anything you prefer, but it is better to start with liquids then soups and crackers, and gradually work up to solid foods.  Please notify your doctor immediately if you have any unusual bleeding, trouble breathing or pain that is not related to your normal pain.  Depending on the type of procedure that was done, some parts of your body may feel week and/or numb.  This usually clears up by tonight or the next day.  Walk with the use of an assistive device or accompanied by an adult for the 24 hours.  You may use ice on the affected area for the first 24 hours.  Put ice in a Ziploc bag and cover with a towel and place against area 15 minutes on 15 minutes off.  You may switch to heat after 24 hours. Trigger Point Injection Trigger points are areas where you have pain. A trigger point injection is a shot given in the trigger point to help relieve pain for a few days to a few months. Common places for trigger points include:  The neck.  The shoulders.  The upper back.  The   lower back. A trigger point injection will not cure long-term (chronic) pain permanently. These injections do not always work for every person. For some people, they can help to relieve pain for a few days to a few months. Tell a health care provider about:  Any allergies you have.  All medicines you are taking, including vitamins, herbs, eye drops, creams, and over-the-counter medicines.  Any problems you or family members have had with anesthetic medicines.  Any blood disorders you have.  Any surgeries you have had.  Any medical conditions you have. What are the risks? Generally, this is a safe procedure. However, problems may occur,  including:  Infection.  Bleeding or bruising.  Allergic reaction to the injected medicine.  Irritation of the skin around the injection site. What happens before the procedure? Ask your health care provider about:  Changing or stopping your regular medicines. This is especially important if you are taking diabetes medicines or blood thinners.  Taking medicines such as aspirin and ibuprofen. These medicines can thin your blood. Do not take these medicines unless your health care provider tells you to take them.  Taking over-the-counter medicines, vitamins, herbs, and supplements. What happens during the procedure?   Your health care provider will feel for trigger points. A marker may be used to circle the area for the injection.  The skin over the trigger point will be washed with a germ-killing (antiseptic) solution.  A thin needle is used for the injection. You may feel pain or a twitching feeling when the needle enters the trigger point.  A numbing solution may be injected into the trigger point. Sometimes a medicine to keep down inflammation is also injected.  Your health care provider may move the needle around the area where the trigger point is located until the tightness and twitching goes away.  After the injection, your health care provider may put gentle pressure over the injection site.  The injection site will be covered with a bandage (dressing). The procedure may vary among health care providers and hospitals. What can I expect after treatment? After treatment, you may have:  Soreness and stiffness for 1-2 days.  A dressing. This can be taken off in a few hours or as told by your health care provider. Follow these instructions at home: Injection site care  Remove your dressing as told by your health care provider.  Check your injection site every day for signs of infection. Check for: ? Redness, swelling, or pain. ? Fluid or blood. ? Warmth. ? Pus or a  bad smell. Managing pain, stiffness, and swelling  If directed, put ice on the affected area. ? Put ice in a plastic bag. ? Place a towel between your skin and the bag. ? Leave the ice on for 20 minutes, 2-3 times a day. General instructions  If you were asked to stop your regular medicines, ask your health care provider when you may start taking them again.  Return to your normal activities as told by your health care provider. Ask your health care provider what activities are safe for you.  Do not take baths, swim, or use a hot tub until your health care provider approves.  You may be asked to see an occupational or physical therapist for exercises that reduce muscle strain and stretch the area of the trigger point.  Keep all follow-up visits as told by your health care provider. This is important. Contact a health care provider if:  Your pain comes back, and   it is worse than before the injection. You may need more injections.  You have chills or a fever.  The injection site becomes more painful, red, swollen, or warm to the touch. Summary  A trigger point injection is a shot given in the trigger point to help relieve pain for a few days to a few months.  Common places for trigger point injections are the neck, shoulder, upper back, and lower back.  These injections do not always work for every person, but for some people, the injections can help to relieve pain for a few days to a few months.  Contact a health care provider if symptoms come back or they are worse than before treatment. Also, get help if the injection site becomes more painful, red, swollen, or warm to the touch. This information is not intended to replace advice given to you by your health care provider. Make sure you discuss any questions you have with your health care provider. Document Revised: 09/04/2018 Document Reviewed: 09/04/2018 [employer] Patient Education  2020 [employer]  

## 2020-03-30 NOTE — Progress Notes (Signed)
PROVIDER NOTE: Information contained herein reflects review and annotations entered in association with encounter. Interpretation of such information and data should be left to medically-trained personnel. Information provided to patient can be located elsewhere in the medical record under "Patient Instructions". Document created using STT-dictation technology, any transcriptional errors that may result from process are unintentional.    Patient: Mark [name]  Service Category: Procedure  Provider: Oswaldo Done, MD  DOB: 05-Nov-1955  DOS: 03/30/2020  Location: [facility]  MRN: [id number]  Setting: Ambulatory - outpatient  Referring Provider: [name], MD  Type: Established Patient  Specialty: Interventional Pain Management  PCP: [name], MD   Primary Reason for Visit: Interventional Pain Management Treatment. CC: Shoulder Pain (left)  Procedure:          Anesthesia, Analgesia, Anxiolysis:  Type: Trigger Point Injection (1-2 muscle groups) #1  CPT: 20552 Primary Purpose: Therapeutic Region: Upper Back Level: Thoracic Target Area: Left levator scapula, supraspinatus muscle, rhomboid minor and major Trigger Points Approach: Percutaneous, ipsilateral approach. Laterality: Left Paramedial  Type: Local Anesthesia Indication(s): Analgesia         Local Anesthetic: Lidocaine 1-2% Route: Infiltration (/IM) IV Access: Declined Sedation: Declined   Position: Sitting   Indications: 1. Trigger point with back pain (Left)   2. Trigger point of shoulder region (scapula) (Left)    Pain Score: Pre-procedure: 3 /10 Post-procedure: 0-No pain/10   Pre-op Assessment:  Mark [name] is a 64 y.o. (year old), male patient, seen today for interventional treatment. He  has a past surgical history that includes Tonsillectomy (1964); Carpal tunnel release (Bilateral, 2006); Ulnar nerve repair (2008); Cervical spine surgery (2008); Rotator cuff repair (Left, 2009);  pacemaker placement (2009); Lithotripsy (Bilateral, 2012); Lithotripsy (Left, 11/2015); Cardiovascular stress test (2014); Colonoscopy (07/2007); implantable cardioverter defibrillator (icd) generator change (Left, 12/13/2017); and pace maker revision (12/13/2017). Mark [name] has a current medication list which includes the following prescription(s): vitamin c, aspirin, atorvastatin, cetirizine, vitamin d, cranberry, cyclobenzaprine, desonide, diphenhydramine, docusate sodium, fluoxetine, fluoxetine, fluoxetine hcl (pmdd), fluticasone, furosemide, garlic, melatonin, multiple vitamin, certavite senior/antioxidant, narcan, naproxen, omega-3, omeprazole, oxycodone hcl, [START ON 04/09/2020] oxycodone hcl, [START ON 05/09/2020] oxycodone hcl, potassium citrate, ropinirole, saw palmetto (serenoa repens), sildenafil, vitamin b complex-c, and vitamin e. His primarily concern today is the Shoulder Pain (left)  Initial Vital Signs:  Pulse/HCG Rate: 84  Temp: 97.7 F (36.5 C) Resp: 18 BP: (!) 149/76 SpO2: 99 %  BMI: Estimated body mass index is 35.71 kg/m as calculated from the following:   Height as of this encounter: 5\' 7"  (1.702 m).   Weight as of this encounter: 228 lb (103.4 kg).  Risk Assessment: Allergies: Reviewed. He is allergic to ciprofloxacin, metformin, buprenorphine hcl, morphine and related, and penicillins.  Allergy Precautions: None required Coagulopathies: Reviewed. None identified.  Blood-thinner therapy: None at this time Active Infection(s): Reviewed. None identified. Mark [name] is afebrile  Site Confirmation: Mark [name] was asked to confirm the procedure and laterality before marking the site Procedure checklist: Completed Consent: Before the procedure and under the influence of no sedative(s), amnesic(s), or anxiolytics, the patient was informed of the treatment options, risks and possible complications. To fulfill our ethical and legal obligations, as recommended by the American Medical  Association's Code of Ethics, I have informed the patient of my clinical impression; the nature and purpose of the treatment or procedure; the risks, benefits, and possible complications of the intervention; the alternatives, including doing nothing; the risk(s) and benefit(s) of the  alternative treatment(s) or procedure(s); and the risk(s) and benefit(s) of doing nothing. The patient was provided information about the general risks and possible complications associated with the procedure. These may include, but are not limited to: failure to achieve desired goals, infection, bleeding, organ or nerve damage, allergic reactions, paralysis, and death. In addition, the patient was informed of those risks and complications associated to the procedure, such as failure to decrease pain; infection; bleeding; organ or nerve damage with subsequent damage to sensory, motor, and/or autonomic systems, resulting in permanent pain, numbness, and/or weakness of one or several areas of the body; allergic reactions; (i.e.: anaphylactic reaction); and/or death. Furthermore, the patient was informed of those risks and complications associated with the medications. These include, but are not limited to: allergic reactions (i.e.: anaphylactic or anaphylactoid reaction(s)); adrenal axis suppression; blood sugar elevation that in diabetics may result in ketoacidosis or comma; water retention that in patients with history of congestive heart failure may result in shortness of breath, pulmonary edema, and decompensation with resultant heart failure; weight gain; swelling or edema; medication-induced neural toxicity; particulate matter embolism and blood vessel occlusion with resultant organ, and/or nervous system infarction; and/or aseptic necrosis of one or more joints. Finally, the patient was informed that Medicine is not an exact science; therefore, there is also the possibility of unforeseen or unpredictable risks and/or possible  complications that may result in a catastrophic outcome. The patient indicated having understood very clearly. We have given the patient no guarantees and we have made no promises. Enough time was given to the patient to ask questions, all of which were answered to the patient's satisfaction. Mr. Voght has indicated that he wanted to continue with the procedure. Attestation: I, the ordering provider, attest that I have discussed with the patient the benefits, risks, side-effects, alternatives, likelihood of achieving goals, and potential problems during recovery for the procedure that I have provided informed consent. Date  Time: 03/30/2020 12:34 PM  Pre-Procedure Preparation:  Monitoring: As per clinic protocol. Respiration, ETCO2, SpO2, BP, heart rate and rhythm monitor placed and checked for adequate function Safety Precautions: Patient was assessed for positional comfort and pressure points before starting the procedure. Time-out: I initiated and conducted the "Time-out" before starting the procedure, as per protocol. The patient was asked to participate by confirming the accuracy of the "Time Out" information. Verification of the correct person, site, and procedure were performed and confirmed by me, the nursing staff, and the patient. "Time-out" conducted as per Joint Commission's Universal Protocol ([email or web address].01.01.01). Time: 1355  Description of Procedure:          Area Prepped: Entire Posterior Cervicothoracic Region DuraPrep (Iodine Povacrylex [0.7% available iodine] and Isopropyl Alcohol, 74% w/w) Safety Precautions: Aspiration looking for blood return was conducted prior to all injections. At no point did we inject any substances, as a needle was being advanced. No attempts were made at seeking any paresthesias. Safe injection practices and needle disposal techniques used. Medications properly checked for expiration dates. SDV (single dose vial) medications used. Description of the Procedure:  Protocol guidelines were followed. The patient was placed in position over the fluoroscopy table. The target area was identified and the area prepped in the usual manner. Skin & deeper tissues infiltrated with local anesthetic. Appropriate amount of time allowed to pass for local anesthetics to take effect. The procedure needles were then advanced to the target area. Proper needle placement secured. Negative aspiration confirmed. Solution injected in intermittent fashion, asking for systemic symptoms every 0.5cc  of injectate. The needles were then removed and the area cleansed, making sure to leave some of the prepping solution back to take advantage of its long term bactericidal properties.  Vitals:   03/30/20 1233 03/30/20 1359  BP: (!) 149/76 (!) 159/74  Pulse: 84 70  Resp: 18 18  Temp: 97.7 F (36.5 C)   TempSrc: Temporal   SpO2: 99% 99%  Weight: 228 lb (103.4 kg)   Height: 5\' 7"  (1.702 m)     Start Time: 1355 hrs. End Time: 1356 hrs. Materials:  Needle(s) Type: Epidural needle Gauge: 25G Length: 1.5-in Medication(s): Please see orders for medications and dosing details.  Imaging Guidance:          Type of Imaging Technique: None used Indication(s): N/A Exposure Time: No patient exposure Contrast: None used. Fluoroscopic Guidance: N/A Ultrasound Guidance: N/A Interpretation: N/A  Antibiotic Prophylaxis:   Anti-infectives (From admission, onward)   None     Indication(s): None identified  Post-operative Assessment:  Post-procedure Vital Signs:  Pulse/HCG Rate: 70  Temp: 97.7 F (36.5 C) Resp: 18 BP: (!) 159/74 SpO2: 99 %  EBL: None  Complications: No immediate post-treatment complications observed by team, or reported by patient.  Note: The patient tolerated the entire procedure well. A repeat set of vitals were taken after the procedure and the patient was kept under observation following institutional policy, for this type of procedure. Post-procedural  neurological assessment was performed, showing return to baseline, prior to discharge. The patient was provided with post-procedure discharge instructions, including a section on how to identify potential problems. Should any problems arise concerning this procedure, the patient was given instructions to immediately contact , at any time, without hesitation. In any case, we plan to contact the patient by telephone for a follow-up status report regarding this interventional procedure.  Comments:  No additional relevant information.  Plan of Care  Orders:  Orders Placed This Encounter  Procedures  . TRIGGER POINT INJECTION    Scheduling Instructions:     Area: Upper Back     Side: Left     Sedation: No Sedation.     Timeframe: Today    Order Specific Question:   Where will this procedure be performed?    Answer:   [facility]  . Informed Consent Details: Physician/Practitioner Attestation; Transcribe to consent form and obtain patient signature    Provider Attestation: I, Mehtaab Mayeda A. [place], MD, (Pain Management Specialist), the physician/practitioner, attest that I have discussed with the patient the benefits, risks, side effects, alternatives, likelihood of achieving goals and potential problems during recovery for the procedure that I have provided informed consent.    Scheduling Instructions:     Procedure: Myoneural Block (Trigger Point injection)     Indications: Musculoskeletal pain/myofascial pain secondary to trigger point     Note: Always confirm laterality of pain with Mr. Samples, before procedure.     Transcribe to consent form and obtain patient signature.   Chronic Opioid Analgesic:  Oxycodone IR 10 mg, 1 tab PO q 6 hrs (40 mg/day total of oxycodone) MME/day:60 mg/day.   Medications ordered for procedure: Meds ordered this encounter  Medications  . triamcinolone acetonide (KENALOG-40) injection 40 mg  . ropivacaine (PF) 2 mg/mL (0.2%) (NAROPIN) injection 9 mL    Medications administered: We administered triamcinolone acetonide and ropivacaine (PF) 2 mg/mL (0.2%).  See the medical record for exact dosing, route, and time of administration.  Follow-up plan:   Return in about 2 weeks (around  04/13/2020) for (VV), (PP) Follow-up, PM on Proc-day.       Considering:  Diagnostic right hand digit 2 trigger finger injection Therapeutic bilateral IA Hyalgan knee injections  Diagnostic bilateral cervical facet block  Possible bilateral cervical facet RFA  Diagnostic left sided CESI  Diagnostic bilateral IA knee injection Diagnostic bilateral genicular NB Possible bilateral genicular nerve RFA  Diagnostic bilateral sacroiliac joint block Possible bilateral sacroiliac joint RFA  Diagnostic bilateral IA shoulder jointinjection  Diagnostic bilateral suprascapular NB Possible bilateral suprascapular nerve RFA  Diagnostic bilateral lumbar facet block Possible bilateral lumbar facet RFA  Diagnostic left L4-5 LESI    Palliative PRN treatment(s):  Bilateral intra-articular Hyalgan knee injectionseries (injection #4)  Diagnostic bilateral cervical facetblock  Diagnostic left sided CESI  Diagnostic bilateral IA knee injection Diagnostic bilateral genicular NB Diagnostic bilateral sacroiliac joint block Diagnostic bilateral IA shoulder joint injection  Diagnostic bilateral suprascapularNB  Diagnostic bilateral lumbar facetblock  Diagnostic left L4-5 LESI     Recent Visits Date Type Provider Dept  03/11/20 Procedure visit Delano MetzNaveira, Jaccob Czaplicki, MD [facility]  03/03/20 Office Visit Delano MetzNaveira, Naman Spychalski, MD [facility]  Showing recent visits within past 90 days and meeting all other requirements Today's Visits Date Type Provider Dept  03/30/20 Procedure visit Delano MetzNaveira, Lataja Newland, MD [facility]  Showing today's visits and meeting all other requirements Future Appointments Date Type Provider Dept  04/13/20  Appointment Delano MetzNaveira, Amauria Younts, MD [facility]  Showing future appointments within next 90 days and meeting all other requirements  Disposition: Discharge home  Discharge (Date  Time): 03/30/2020; 1405 hrs.   Primary Care Physician: Eustaquio BoydenGutierrez, Javier, MD [facility] Note by: Oswaldo DoneFrancisco A Jerrion Tabbert, MD Date: 03/30/2020; Time: 2:05 PM  Disclaimer:  Medicine is not an Visual merchandiserexact science. The only guarantee in medicine is that nothing is guaranteed. It is important to note that the decision to proceed with this intervention was based on the information collected from the patient. The Data and conclusions were drawn from the patient's questionnaire, the interview, and the physical examination. Because the information was provided in large part by the patient, it cannot be guaranteed that it has not been purposely or unconsciously manipulated. Every effort has been made to obtain as much relevant data as possible for this evaluation. It is important to note that the conclusions that lead to this procedure are derived in large part from the available data. Always take into account that the treatment will also be dependent on availability of resources and existing treatment guidelines, considered by other Pain Management Practitioners as being common knowledge and practice, at the time of the intervention. For Medico-Legal purposes, it is also important to point out that variation in procedural techniques and pharmacological choices are the acceptable norm. The indications, contraindications, technique, and results of the above procedure should only be interpreted and judged by a Board-Certified Interventional Pain Specialist with extensive familiarity and expertise in the same exact procedure and technique.

## 2020-03-31 ENCOUNTER — Telehealth: Payer: Self-pay | Admitting: *Deleted

## 2020-03-31 NOTE — Telephone Encounter (Signed)
No problems post procedure. 

## 2020-04-07 ENCOUNTER — Encounter: Payer: Self-pay | Admitting: Pain Medicine

## 2020-04-11 NOTE — Progress Notes (Signed)
Patient: Mark [name]  Service Category: E/M  Provider: Gaspar Cola, MD  DOB: May 13, 1956  DOS: 04/13/2020  Location: Office  MRN: [id number]  Setting: Ambulatory outpatient  Referring Provider: Ria Bush, MD  Type: Established Patient  Specialty: Interventional Pain Management  PCP: Ria Bush, MD  Location: Remote location  Delivery: TeleHealth     Virtual Encounter - Pain Management PROVIDER NOTE: Information contained herein reflects review and annotations entered in association with encounter. Interpretation of such information and data should be left to medically-trained personnel. Information provided to patient can be located elsewhere in the medical record under "Patient Instructions". Document created using STT-dictation technology, any transcriptional errors that may result from process are unintentional.    Contact & Pharmacy Preferred: [phone] Home: [phone] (home) Mobile: [phone] (mobile) E-mail: [email or web address]  [place], [place] [address] [place] [address] Phone: [phone] Fax: [phone]  [facility] ([place]) [place], [place] [address] Phone: [phone] Fax: [phone]   Pre-screening  Mark [name] offered "in-person" vs "virtual" encounter. He indicated preferring virtual for this encounter.   Reason COVID-19*  Social distancing based on CDC and AMA recommendations.   I contacted Mark [name] on 04/13/2020 via telephone.      I clearly identified myself as Gaspar Cola, MD. I verified that I was speaking with the correct person using two identifiers (Name: VIRLAN KEMPKER, and date of birth: Apr 18, 1956).  Consent I sought verbal advanced consent from Mark [name] for virtual visit interactions. I informed Mark [name] of possible security and privacy concerns, risks, and limitations associated  with providing "not-in-person" medical evaluation and management services. I also informed Mark [name] of the availability of "in-person" appointments. Finally, I informed him that there would be a charge for the virtual visit and that he could be  personally, fully or partially, financially responsible for it. Mark [name] expressed understanding and agreed to proceed.   Historic Elements   Mark [name] is a 64 y.o. year old, male patient evaluated today after his last contact with our practice on 03/31/2020. Mark [name]  has a past medical history of Anxiety, Arthritis, Chronic pain, Depression, Heart murmur, History of kidney stones (April 12,2017), History of permanent cardiac pacemaker placement (05/31/2015), Hydrocele in adult (05/07/2015), Kidney stones, Migraine, MRSA carrier (2019), Presence of permanent cardiac pacemaker, Prostatitis, Sleep apnea, and Sleep apnea. He also  has a past surgical history that includes Tonsillectomy (1964); Carpal tunnel release (Bilateral, 2006); Ulnar nerve repair (2008); Cervical spine surgery (2008); Rotator cuff repair (Left, 2009); pacemaker placement (2009); Lithotripsy (Bilateral, 2012); Lithotripsy (Left, 11/2015); Cardiovascular stress test (2014); Colonoscopy (07/2007); implantable cardioverter defibrillator (icd) generator change (Left, 12/13/2017); and pace maker revision (12/13/2017). Mark [name] has a current medication list which includes the following prescription(s): vitamin c, aspirin, atorvastatin, cetirizine, vitamin d, cranberry, cyclobenzaprine, desonide, diphenhydramine, docusate sodium, fluoxetine, fluoxetine, fluoxetine hcl (pmdd), fluticasone, furosemide, garlic, melatonin, multiple vitamin, certavite senior/antioxidant, narcan, naproxen, omega-3, omeprazole, oxycodone hcl, oxycodone hcl, [START ON 05/09/2020] oxycodone hcl, potassium citrate, ropinirole, saw palmetto (serenoa repens), sildenafil, vitamin b complex-c, and vitamin e. He  reports that he has  never smoked. He has never used smokeless tobacco. He reports that he does not drink alcohol and does not use drugs. Mark [name] is allergic to ciprofloxacin, metformin, buprenorphine hcl, morphine and related, and penicillins.   HPI  Today, he is being contacted for a post-procedure assessment.  Post-Procedure Evaluation  Procedure (03/30/2020): Therapeutic left levator scapula, supraspinatus muscle, rhomboid minor and major trigger point injection, no fluoroscopy or IV sedation. Pre-procedure pain level: 3/10 Post-procedure: 0/10 (100% relief)  Sedation: None.  Effectiveness during initial hour after procedure(Ultra-Short Term Relief): 100 %.  Local anesthetic used: Long-acting (4-6 hours) Effectiveness: Defined as any analgesic benefit obtained secondary to the administration of local anesthetics. This carries significant diagnostic value as to the etiological location, or anatomical origin, of the pain. Duration of benefit is expected to coincide with the duration of the local anesthetic used.  Effectiveness during initial 4-6 hours after procedure(Short-Term Relief): 100 %.  Long-term benefit: Defined as any relief past the pharmacologic duration of the local anesthetics.  Effectiveness past the initial 6 hours after procedure(Long-Term Relief): 85 %.  Current benefits: Defined as benefit that persist at this time.   Analgesia:  >75% relief Function: Mark [name] reports improvement in function ROM: Mark [name] reports improvement in ROM  Pharmacotherapy Assessment  Analgesic: Oxycodone IR 10 mg, 1 tab PO q 6 hrs (40 mg/day total of oxycodone) MME/day:60 mg/day.   Monitoring: [place] PMP: PDMP reviewed during this encounter.       Pharmacotherapy: No side-effects or adverse reactions reported. Compliance: No problems identified. Effectiveness: Clinically acceptable. Plan: Refer to "POC".  UDS:  Summary  Date Value Ref Range Status  01/06/2020 Note  Final    Comment:     ==================================================================== [employer] Select 13 (MW) ==================================================================== Test                             Result       Flag       Units Drug Present and Declared for Prescription Verification   Oxycodone                      2773         EXPECTED   ng/mg creat   Noroxycodone                   3993         EXPECTED   ng/mg creat    Sources of oxycodone include scheduled prescription medications.    Noroxycodone is an expected metabolite of oxycodone. ==================================================================== Test                      Result    Flag   Units      Ref Range   Creatinine              44               mg/dL      >=20 ==================================================================== Declared Medications:  The flagging and interpretation on this report are based on the  following declared medications.  Unexpected results may arise from  inaccuracies in the declared medications.  **Note: The testing scope of this panel includes these medications:  Oxycodone  **Note: The testing scope of this panel does not include the  following reported medications:  Fluoxetine (Prozac)  Fluticasone (Flonase)  Furosemide (Lasix)  Melatonin  Multivitamin  Naloxone (Narcan)  Naproxen  Omega-3 Fatty Acids  Omeprazole (Prilosec)  Potassium  Sildenafil  Supplement  Vitamin E ==================================================================== For clinical consultation, please call [phone]. ====================================================================     Laboratory Chemistry Profile   Renal Lab Results  Component Value Date  BUN 25 (H) 08/13/2019   CREATININE 1.03 08/13/2019   GFR 73.03 08/13/2019   GFRAA >60 12/06/2017   GFRNONAA >60 12/06/2017     Hepatic Lab Results  Component Value Date   AST 39 (H) 08/13/2019   ALT 29 08/13/2019   ALBUMIN 4.3 08/13/2019    ALKPHOS 65 08/13/2019   HCVAB NEGATIVE 07/22/2013     Electrolytes Lab Results  Component Value Date   NA 140 08/13/2019   K 3.6 08/13/2019   CL 101 08/13/2019   CALCIUM 9.3 08/13/2019   MG 2.1 09/01/2015     Bone Lab Results  Component Value Date   25OHVITD1 63 02/02/2016   25OHVITD2 <1.0 02/02/2016   25OHVITD3 63 02/02/2016   TESTOSTERONE 216.29 (L) 02/13/2017     Inflammation (CRP: Acute Phase) (ESR: Chronic Phase) Lab Results  Component Value Date   CRP 1.7 (H) 09/01/2015   ESRSEDRATE 6 09/01/2015       Note: Above Lab results reviewed.  Imaging  DG PAIN CLINIC C-ARM 1-60 MIN NO REPORT Fluoro was used, but no Radiologist interpretation will be provided.  Please refer to "NOTES" tab for provider progress note.  Assessment  There were no encounter diagnoses.  Plan of Care  Problem-specific:  No problem-specific Assessment & Plan notes found for this encounter.  Mark [name] has a current medication list which includes the following long-term medication(s): atorvastatin, cetirizine, diphenhydramine, fluoxetine, fluoxetine, fluoxetine hcl (pmdd), fluticasone, furosemide, omeprazole, oxycodone hcl, oxycodone hcl, [START ON 05/09/2020] oxycodone hcl, and ropinirole.  Pharmacotherapy (Medications Ordered): No orders of the defined types were placed in this encounter.  Orders:  No orders of the defined types were placed in this encounter.  Follow-up plan:   No follow-ups on file.      Considering:  Diagnostic right hand digit 2 trigger finger injection Therapeutic bilateral IA Hyalgan knee injections  Diagnostic bilateral cervical facet block  Possible bilateral cervical facet RFA  Diagnostic left sided CESI  Diagnostic bilateral IA knee injection Diagnostic bilateral genicular NB Possible bilateral genicular nerve RFA  Diagnostic bilateral sacroiliac joint block Possible bilateral sacroiliac joint RFA  Diagnostic bilateral IA shoulder  jointinjection  Diagnostic bilateral suprascapular NB Possible bilateral suprascapular nerve RFA  Diagnostic bilateral lumbar facet block Possible bilateral lumbar facet RFA  Diagnostic left L4-5 LESI    Palliative PRN treatment(s):  Bilateral intra-articular Hyalgan knee injectionseries (injection #4)  Diagnostic bilateral cervical facetblock  Diagnostic left sided CESI  Diagnostic bilateral IA knee injection Diagnostic bilateral genicular NB Diagnostic bilateral sacroiliac joint block Diagnostic bilateral IA shoulder joint injection  Diagnostic bilateral suprascapularNB  Diagnostic bilateral lumbar facetblock  Diagnostic left L4-5 LESI      Recent Visits Date Type Provider Dept  03/30/20 Procedure visit Milinda Pointer, MD [facility]  03/11/20 Procedure visit Milinda Pointer, MD [facility]  03/03/20 Office Visit Milinda Pointer, MD [facility]  Showing recent visits within past 90 days and meeting all other requirements Future Appointments Date Type Provider Dept  04/13/20 Telemedicine Milinda Pointer, MD [facility]  Showing future appointments within next 90 days and meeting all other requirements  I discussed the assessment and treatment plan with the patient. The patient was provided an opportunity to ask questions and all were answered. The patient agreed with the plan and demonstrated an understanding of the instructions.  Patient advised to call back or seek an in-person evaluation if the symptoms or condition worsens.  Duration of encounter: 12 minutes.  Note by: Gaspar Cola, MD Date: 04/13/2020; Time: 8:57 AM

## 2020-04-13 ENCOUNTER — Ambulatory Visit: Payer: Medicare Other | Attending: Pain Medicine | Admitting: Pain Medicine

## 2020-04-13 ENCOUNTER — Other Ambulatory Visit: Payer: Self-pay

## 2020-04-13 DIAGNOSIS — M549 Dorsalgia, unspecified: Secondary | ICD-10-CM

## 2020-04-13 DIAGNOSIS — M25512 Pain in left shoulder: Secondary | ICD-10-CM | POA: Diagnosis not present

## 2020-04-13 DIAGNOSIS — G894 Chronic pain syndrome: Secondary | ICD-10-CM | POA: Diagnosis not present

## 2020-04-13 DIAGNOSIS — G8929 Other chronic pain: Secondary | ICD-10-CM

## 2020-04-13 DIAGNOSIS — M1812 Unilateral primary osteoarthritis of first carpometacarpal joint, left hand: Secondary | ICD-10-CM | POA: Diagnosis not present

## 2020-04-13 DIAGNOSIS — M542 Cervicalgia: Secondary | ICD-10-CM

## 2020-04-21 ENCOUNTER — Other Ambulatory Visit: Payer: Self-pay | Admitting: Family Medicine

## 2020-04-21 NOTE — Telephone Encounter (Signed)
Refill request cyclobenzaprine Last refill 02/04/20 #40 Last office visit 09/01/19

## 2020-04-22 NOTE — Telephone Encounter (Signed)
ERx 

## 2020-06-14 ENCOUNTER — Encounter: Payer: Self-pay | Admitting: Pain Medicine

## 2020-06-14 ENCOUNTER — Other Ambulatory Visit: Payer: Self-pay

## 2020-06-14 ENCOUNTER — Ambulatory Visit: Payer: Medicare Other | Attending: Pain Medicine | Admitting: Pain Medicine

## 2020-06-14 VITALS — BP 147/77 | HR 99 | Temp 97.9°F | Ht 68.0 in | Wt 228.0 lb

## 2020-06-14 DIAGNOSIS — Z79899 Other long term (current) drug therapy: Secondary | ICD-10-CM

## 2020-06-14 DIAGNOSIS — G8929 Other chronic pain: Secondary | ICD-10-CM | POA: Insufficient documentation

## 2020-06-14 DIAGNOSIS — M25512 Pain in left shoulder: Secondary | ICD-10-CM | POA: Diagnosis not present

## 2020-06-14 DIAGNOSIS — G894 Chronic pain syndrome: Secondary | ICD-10-CM | POA: Insufficient documentation

## 2020-06-14 DIAGNOSIS — M549 Dorsalgia, unspecified: Secondary | ICD-10-CM | POA: Diagnosis not present

## 2020-06-14 DIAGNOSIS — M542 Cervicalgia: Secondary | ICD-10-CM | POA: Diagnosis not present

## 2020-06-14 MED ORDER — OXYCODONE HCL 10 MG PO TABS
10.0000 mg | ORAL_TABLET | Freq: Four times a day (QID) | ORAL | 0 refills | Status: DC | PRN
Start: 2020-07-14 — End: 2020-09-08

## 2020-06-14 MED ORDER — OXYCODONE HCL 10 MG PO TABS: 10.0000 mg | ORAL_TABLET | Freq: Four times a day (QID) | ORAL | 0 refills | Status: DC | PRN

## 2020-06-14 NOTE — Progress Notes (Signed)
Nursing Pain Medication Assessment:  Safety precautions to be maintained throughout the outpatient stay will include: orient to surroundings, keep bed in low position, maintain call bell within reach at all times, provide assistance with transfer out of bed and ambulation.  Medication Inspection Compliance: Pill count conducted under aseptic conditions, in front of the patient. Neither the pills nor the bottle was removed from the patient's sight at any time. Once count was completed pills were immediately returned to the patient in their original bottle.  Medication: Oxycodone IR Pill/Patch Count: 10 of 120 pills remain Pill/Patch Appearance: Markings consistent with prescribed medication Bottle Appearance: Standard pharmacy container. Clearly labeled. Filled Date: 10 / 3 / 21 Last Medication intake:  TodaySafety precautions to be maintained throughout the outpatient stay will include: orient to surroundings, keep bed in low position, maintain call bell within reach at all times, provide assistance with transfer out of bed and ambulation.

## 2020-06-14 NOTE — Progress Notes (Signed)
PROVIDER NOTE: Information contained herein reflects review and annotations entered in association with encounter. Interpretation of such information and data should be left to medically-trained personnel. Information provided to patient can be located elsewhere in the medical record under "Patient Instructions". Document created using STT-dictation technology, any transcriptional errors that may result from process are unintentional.    Patient: Mark [name]  Service Category: E/M  Provider: Gaspar Cola, MD  DOB: 1955/10/08  DOS: 06/14/2020  Specialty: Interventional Pain Management  MRN: [id number]  Setting: Ambulatory outpatient  PCP: Ria Bush, MD  Type: Established Patient    Referring Provider: Ria Bush, MD  Location: Office  Delivery: Face-to-face     HPI  Mark [name], a 64 y.o. year old male, is here today because of his Chronic pain syndrome [G89.4]. Mark [name] primary complain today is Neck Pain Last encounter: My last encounter with him was on 03/30/2020. Pertinent problems: 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); Cervicogenic headache; Cervical spondylosis with radiculopathy (Bilateral) (L>R); Cervical facet syndrome (Bilateral) (L>R); Cervical foraminal stenosis (Severe C6-7) (Left); Chronic sacroiliac joint pain (Bilateral); Osteoarthritis of sacroiliac joint (Bilateral); Lumbar facet hypertrophy (L1-2, L2-3, and L4-5) (Bilateral); Lumbar IVDD (intervertebral disc displacement); 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); Lumbar facet syndrome (Bilateral); Osteoarthritis of shoulder (Left); Osteoarthritis of knee (Bilateral) (R>L); Chronic cervical radicular pain; Chronic fatigue; RLS (restless legs syndrome);  Trigger finger of index finger (Right); Cervicalgia; Trigger finger of thumb (Right); Chronic thumb pain (Left); Trigger middle finger of right hand; Chronic hand pain (Left); Osteoarthritis of first carpometacarpal joint (thumb) of hand (Left); Trigger point with back pain (Left); and Trigger point of shoulder region (scapula) (Left) on their pertinent problem list. Pain Assessment: Severity of Chronic pain is reported as a 3 /10. Location: Neck Right, Left, Lower/at times radiaties down left arm. Onset: More than a month ago. Quality: Aching, Burning, Constant. Timing: Constant. Modifying factor(s): meds and rest. Vitals:  height is _0  (1.727 m) and weight is 228 lb (103.4 kg). His temperature is 97.9 F (36.6 C). His blood pressure is 147/77 (abnormal) and his pulse is 99. His oxygen saturation is 99%.   Reason for encounter: medication management.  The patient indicates doing well with the current medication regimen. No adverse reactions or side effects reported to the medications.   RTCB: 09/12/2020  Pharmacotherapy Assessment   Analgesic: Oxycodone IR 10 mg, 1 tab Mark [name] q 6 hrs (40 mg/day total of oxycodone) MME/day:60 mg/day.   Monitoring: [place] PMP: PDMP reviewed during this encounter.       Pharmacotherapy: No side-effects or adverse reactions reported. Compliance: No problems identified. Effectiveness: Clinically acceptable.  Chauncey Fischer, RN  06/14/2020  1:14 PM  Sign when Signing Visit Nursing Pain Medication Assessment:  Safety precautions to be maintained throughout the outpatient stay will include: orient to surroundings, keep bed in low position, maintain call bell within reach at all times, provide assistance with transfer out of bed and ambulation.  Medication Inspection Compliance: Pill count conducted under aseptic conditions, in front of the patient. Neither the pills nor the bottle was removed from the patient's sight at any time. Once count was completed pills were  immediately returned to the patient in their original bottle.  Medication: Oxycodone IR Pill/Patch Count: 10 of 120 pills  remain Pill/Patch Appearance: Markings consistent with prescribed medication Bottle Appearance: Standard pharmacy container. Clearly labeled. Filled Date: 10 / 3 / 21 Last Medication intake:  TodaySafety precautions to be maintained throughout the outpatient stay will include: orient to surroundings, keep bed in low position, maintain call bell within reach at all times, provide assistance with transfer out of bed and ambulation.     UDS:  Summary  Date Value Ref Range Status  01/06/2020 Note  Final    Comment:    ==================================================================== [employer] 13 (MW) ==================================================================== Test                             Result       Flag       Units Drug Present and Declared for Prescription Verification   Oxycodone                      2773         EXPECTED   ng/mg creat   Noroxycodone                   3993         EXPECTED   ng/mg creat    Sources of oxycodone include scheduled prescription medications.    Noroxycodone is an expected metabolite of oxycodone. ==================================================================== Test                      Result    Flag   Units      Ref Range   Creatinine              44               mg/dL      >=20 ==================================================================== Declared Medications:  The flagging and interpretation on this report are based on the  following declared medications.  Unexpected results may arise from  inaccuracies in the declared medications.  **Note: The testing scope of this panel includes these medications:  Oxycodone  **Note: The testing scope of this panel does not include the  following reported medications:  Fluoxetine (Prozac)  Fluticasone (Flonase)  Furosemide (Lasix)  Melatonin  Multivitamin   Naloxone (Narcan)  Naproxen  Omega-3 Fatty Acids  Omeprazole (Prilosec)  Potassium  Sildenafil  Supplement  Vitamin E ==================================================================== For clinical consultation, please call [phone]. ====================================================================      ROS  Constitutional: Denies any fever or chills Gastrointestinal: No reported hemesis, hematochezia, vomiting, or acute GI distress Musculoskeletal: Denies any acute onset joint swelling, redness, loss of ROM, or weakness Neurological: No reported episodes of acute onset apraxia, aphasia, dysarthria, agnosia, amnesia, paralysis, loss of coordination, or loss of consciousness  Medication Review  CertaVite Senior/Antioxidant, Cranberry, FLUoxetine, Fluoxetine HCl (PMDD), Garlic, Melatonin, Multiple Vitamin, Naproxen, Omega-3, Oxycodone HCl, Saw Palmetto (Serenoa repens), Vitamin B Complex-C, Vitamin D, aspirin, atorvastatin, cetirizine, cyclobenzaprine, desonide, diphenhydrAMINE, docusate sodium, fluticasone, furosemide, naloxone, omeprazole, potassium citrate, rOPINIRole, sildenafil, vitamin C, and vitamin E  History Review  Allergy: Mark [name] is allergic to ciprofloxacin, metformin, buprenorphine hcl, morphine and related, and penicillins. Drug: Mark [name]  reports no history of drug use. Alcohol:  reports no history of alcohol use. Tobacco:  reports that he has never smoked. He has never used smokeless tobacco. Social: Mark [name]  reports that he has never smoked. He has never used smokeless tobacco. He reports that he does not drink alcohol and does not  use drugs. Medical:  has a past medical history of Anxiety, Arthritis, Chronic pain, Depression, Heart murmur, History of kidney stones (April 12,2017), History of permanent cardiac pacemaker placement (05/31/2015), Hydrocele in adult (05/07/2015), Kidney stones, Migraine, MRSA carrier (2019), Presence of permanent cardiac  pacemaker, Prostatitis, Sleep apnea, and Sleep apnea. Surgical: Mark [name]  has a past surgical history that includes Tonsillectomy (1964); Carpal tunnel release (Bilateral, 2006); Ulnar nerve repair (2008); Cervical spine surgery (2008); Rotator cuff repair (Left, 2009); pacemaker placement (2009); Lithotripsy (Bilateral, 2012); Lithotripsy (Left, 11/2015); Cardiovascular stress test (2014); Colonoscopy (07/2007); implantable cardioverter defibrillator (icd) generator change (Left, 12/13/2017); and pace maker revision (12/13/2017). Family: family history includes Arthritis in his father and mother; Cancer in his father and mother; Diabetes in his mother; Heart block in his father; Heart disease in his father and mother.  Laboratory Chemistry Profile   Renal Lab Results  Component Value Date   BUN 25 (H) 08/13/2019   CREATININE 1.03 08/13/2019   GFR 73.03 08/13/2019   GFRAA >60 12/06/2017   GFRNONAA >60 12/06/2017     Hepatic Lab Results  Component Value Date   AST 39 (H) 08/13/2019   ALT 29 08/13/2019   ALBUMIN 4.3 08/13/2019   ALKPHOS 65 08/13/2019   HCVAB NEGATIVE 07/22/2013     Electrolytes Lab Results  Component Value Date   NA 140 08/13/2019   K 3.6 08/13/2019   CL 101 08/13/2019   CALCIUM 9.3 08/13/2019   MG 2.1 09/01/2015     Bone Lab Results  Component Value Date   25OHVITD1 63 02/02/2016   25OHVITD2 <1.0 02/02/2016   25OHVITD3 63 02/02/2016   TESTOSTERONE 216.29 (L) 02/13/2017     Inflammation (CRP: Acute Phase) (ESR: Chronic Phase) Lab Results  Component Value Date   CRP 1.7 (H) 09/01/2015   ESRSEDRATE 6 09/01/2015       Note: Above Lab results reviewed.  Recent Imaging Review  DG PAIN CLINIC C-ARM 1-60 MIN NO REPORT Fluoro was used, but no Radiologist interpretation will be provided.  Please refer to "NOTES" tab for provider progress note. Note: Reviewed        Physical Exam  General appearance: Well nourished, well developed, and well hydrated. In  no apparent acute distress Mental status: Alert, oriented x 3 (person, place, & time)       Respiratory: No evidence of acute respiratory distress Eyes: PERLA Vitals: BP (!) 147/77   Pulse 99   Temp 97.9 F (36.6 C)   Ht _0  (1.727 m)   Wt 228 lb (103.4 kg)   SpO2 99%   BMI 34.67 kg/m  BMI: Estimated body mass index is 34.67 kg/m as calculated from the following:   Height as of this encounter: _1  (1.727 m).   Weight as of this encounter: 228 lb (103.4 kg). Ideal: Ideal body weight: 68.4 kg (150 lb 12.7 oz) Adjusted ideal body weight: 82.4 kg (181 lb 10.8 oz)  Assessment   Status Diagnosis  Controlled Controlled Controlled 1. Chronic pain syndrome   2. Chronic neck pain (Primary Area of Pain) (Bilateral) (L>R)   3. Trigger point of shoulder region (scapula) (Left)   4. Trigger point with back pain (Left)   5. Pharmacologic therapy      Updated Problems: No problems updated.  Plan of Care  Problem-specific:  No problem-specific Assessment & Plan notes found for this encounter.  Mark [name] has a current medication list which includes the following long-term medication(s): atorvastatin, cetirizine,  diphenhydramine, fluoxetine, fluoxetine, fluoxetine hcl (pmdd), fluticasone, furosemide, omeprazole, oxycodone hcl, [START ON 07/14/2020] oxycodone hcl, [START ON 08/13/2020] oxycodone hcl, and ropinirole.  Pharmacotherapy (Medications Ordered): Meds ordered this encounter  Medications  . Oxycodone HCl 10 MG TABS    Sig: Take 1 tablet (10 mg total) by mouth every 6 (six) hours as needed. Must last 30 days    Dispense:  120 tablet    Refill:  0    Chronic Pain: STOP Act (Not applicable) Fill 1 day early if closed on refill date. Avoid benzodiazepines within 8 hours of opioids  . Oxycodone HCl 10 MG TABS    Sig: Take 1 tablet (10 mg total) by mouth every 6 (six) hours as needed. Must last 30 days    Dispense:  120 tablet    Refill:  0    Chronic Pain: STOP Act (Not  applicable) Fill 1 day early if closed on refill date. Avoid benzodiazepines within 8 hours of opioids  . Oxycodone HCl 10 MG TABS    Sig: Take 1 tablet (10 mg total) by mouth every 6 (six) hours as needed. Must last 30 days    Dispense:  120 tablet    Refill:  0    Chronic Pain: STOP Act (Not applicable) Fill 1 day early if closed on refill date. Avoid benzodiazepines within 8 hours of opioids   Orders:  No orders of the defined types were placed in this encounter.  Follow-up plan:   Return in about 3 months (around 09/12/2020) for (F2F), (Med Mgmt).      Considering:  Diagnostic right hand digit 2 trigger finger injection Therapeutic bilateral IA Hyalgan knee injections  Diagnostic bilateral cervical facet block  Possible bilateral cervical facet RFA  Diagnostic left sided CESI  Diagnostic bilateral IA knee injection Diagnostic bilateral genicular NB Possible bilateral genicular nerve RFA  Diagnostic bilateral sacroiliac joint block Possible bilateral sacroiliac joint RFA  Diagnostic bilateral IA shoulder jointinjection  Diagnostic bilateral suprascapular NB Possible bilateral suprascapular nerve RFA  Diagnostic bilateral lumbar facet block Possible bilateral lumbar facet RFA  Diagnostic left L4-5 LESI    Palliative PRN treatment(s):  Bilateral intra-articular Hyalgan knee injectionseries (injection #4)  Diagnostic bilateral cervical facetblock  Diagnostic left sided CESI  Diagnostic bilateral IA knee injection Diagnostic bilateral genicular NB Diagnostic bilateral sacroiliac joint block Diagnostic bilateral IA shoulder joint injection  Diagnostic bilateral suprascapularNB  Diagnostic bilateral lumbar facetblock  Diagnostic left L4-5 LESI       Recent Visits Date Type Provider Dept  06/14/20 Office Visit Milinda Pointer, MD [facility]  04/13/20 Telemedicine Milinda Pointer, MD [facility]  03/30/20 Procedure visit Milinda Pointer, MD [facility]  Showing recent visits within past 90 days and meeting all other requirements Future Appointments Date Type Provider Dept  09/08/20 Appointment Milinda Pointer, MD [facility]  Showing future appointments within next 90 days and meeting all other requirements  I discussed the assessment and treatment plan with the patient. The patient was provided an opportunity to ask questions and all were answered. The patient agreed with the plan and demonstrated an understanding of the instructions.  Patient advised to call back or seek an in-person evaluation if the symptoms or condition worsens.  Duration of encounter: 30 minutes.  Note by: Gaspar Cola, MD Date: 06/14/2020; Time: 6:19 AM

## 2020-06-14 NOTE — Patient Instructions (Signed)
____________________________________________________________________________________________  Medication Rules  Purpose: To inform patients, and their family members, of our rules and regulations.  Applies to: All patients receiving prescriptions (written or electronic).  Pharmacy of record: Pharmacy where electronic prescriptions will be sent. If written prescriptions are taken to a different pharmacy, please inform the nursing staff. The pharmacy listed in the electronic medical record should be the one where you would like electronic prescriptions to be sent.  Electronic prescriptions: In compliance with the [place] Strengthen Opioid Misuse Prevention (STOP) Act of 2017 (Session Law 2017-74/H243), effective August 07, 2018, all controlled substances must be electronically prescribed. Calling prescriptions to the pharmacy will cease to exist.  Prescription refills: Only during scheduled appointments. Applies to all prescriptions.  NOTE: The following applies primarily to controlled substances (Opioid* Pain Medications).   Type of encounter (visit): For patients receiving controlled substances, face-to-face visits are required. (Not an option or up to the patient.)  Patient's responsibilities: 1. Pain Pills: Bring all pain pills to every appointment (except for procedure appointments). 2. Pill Bottles: Bring pills in original pharmacy bottle. Always bring the newest bottle. Bring bottle, even if empty. 3. Medication refills: You are responsible for knowing and keeping track of what medications you take and those you need refilled. The day before your appointment: write a list of all prescriptions that need to be refilled. The day of the appointment: give the list to the admitting nurse. Prescriptions will be written only during appointments. No prescriptions will be written on procedure days. If you forget a medication: it will not be "Called in", "Faxed", or "electronically sent".  You will need to get another appointment to get these prescribed. No early refills. Do not call asking to have your prescription filled early. 4. Prescription Accuracy: You are responsible for carefully inspecting your prescriptions before leaving our office. Have the discharge nurse carefully go over each prescription with you, before taking them home. Make sure that your name is accurately spelled, that your address is correct. Check the name and dose of your medication to make sure it is accurate. Check the number of pills, and the written instructions to make sure they are clear and accurate. Make sure that you are given enough medication to last until your next medication refill appointment. 5. Taking Medication: Take medication as prescribed. When it comes to controlled substances, taking less pills or less frequently than prescribed is permitted and encouraged. Never take more pills than instructed. Never take medication more frequently than prescribed.  6. Inform other Doctors: Always inform, all of your healthcare providers, of all the medications you take. 7. Pain Medication from other Providers: You are not allowed to accept any additional pain medication from any other Doctor or Healthcare provider. There are two exceptions to this rule. (see below) In the event that you require additional pain medication, you are responsible for notifying us, as stated below. 8. Medication Agreement: You are responsible for carefully reading and following our Medication Agreement. This must be signed before receiving any prescriptions from our practice. Safely store a copy of your signed Agreement. Violations to the Agreement will result in no further prescriptions. (Additional copies of our Medication Agreement are available upon request.) 9. Laws, Rules, & Regulations: All patients are expected to follow all Federal and State Laws, Statutes, Rules, & Regulations. Ignorance of the Laws does not constitute a  valid excuse.  10. Illegal drugs and Controlled Substances: The use of illegal substances (including, but not limited to marijuana and its   derivatives) and/or the illegal use of any controlled substances is strictly prohibited. Violation of this rule may result in the immediate and permanent discontinuation of any and all prescriptions being written by our practice. The use of any illegal substances is prohibited. 11. Adopted CDC guidelines & recommendations: Target dosing levels will be at or below 60 MME/day. Use of benzodiazepines** is not recommended.  Exceptions: There are only two exceptions to the rule of not receiving pain medications from other Healthcare Providers. 1. Exception #1 (Emergencies): In the event of an emergency (i.e.: accident requiring emergency care), you are allowed to receive additional pain medication. However, you are responsible for: As soon as you are able, call our office [phone], at any time of the day or night, and leave a message stating your name, the date and nature of the emergency, and the name and dose of the medication prescribed. In the event that your call is answered by a member of our staff, make sure to document and save the date, time, and the name of the person that took your information.  2. Exception #2 (Planned Surgery): In the event that you are scheduled by another doctor or dentist to have any type of surgery or procedure, you are allowed (for a period no longer than 30 days), to receive additional pain medication, for the acute post-op pain. However, in this case, you are responsible for picking up a copy of our "Post-op Pain Management for Surgeons" handout, and giving it to your surgeon or dentist. This document is available at our office, and does not require an appointment to obtain it. Simply go to our office during business hours (Monday-Thursday from 8:00 AM to 4:00 PM) (Friday 8:00 AM to 12:00 Noon) or if you have a scheduled appointment  with us, prior to your surgery, and ask for it by name. In addition, you are responsible for: calling our office [phone], at any time of the day or night, and leaving a message stating your name, name of your surgeon, type of surgery, and date of procedure or surgery. Failure to comply with your responsibilities may result in termination of therapy involving the controlled substances.  *Opioid medications include: morphine, codeine, oxycodone, oxymorphone, hydrocodone, hydromorphone, meperidine, tramadol, tapentadol, buprenorphine, fentanyl, methadone. **Benzodiazepine medications include: diazepam (Valium), alprazolam (Xanax), clonazepam (Klonopine), lorazepam (Ativan), clorazepate (Tranxene), chlordiazepoxide (Librium), estazolam (Prosom), oxazepam (Serax), temazepam (Restoril), triazolam (Halcion) (Last updated: 04/13/2020) ____________________________________________________________________________________________   ____________________________________________________________________________________________  Medication Recommendations and Reminders  Applies to: All patients receiving prescriptions (written and/or electronic).  Medication Rules & Regulations: These rules and regulations exist for your safety and that of others. They are not flexible and neither are we. Dismissing or ignoring them will be considered "non-compliance" with medication therapy, resulting in complete and irreversible termination of such therapy. (See document titled "Medication Rules" for more details.) In all conscience, because of safety reasons, we cannot continue providing a therapy where the patient does not follow instructions.  Pharmacy of record:   Definition: This is the pharmacy where your electronic prescriptions will be sent.   We do not endorse any particular pharmacy, however, we have experienced problems with [facility] not securing enough medication supply for the community.  We do not  restrict you in your choice of pharmacy. However, once we write for your prescriptions, we will NOT be re-sending more prescriptions to fix restricted supply problems created by your pharmacy, or your insurance.   The pharmacy listed in the electronic medical record should be the   one where you want electronic prescriptions to be sent.  If you choose to change pharmacy, simply notify our nursing staff.  Recommendations:  Keep all of your pain medications in a safe place, under lock and key, even if you live alone. We will NOT replace lost, stolen, or damaged medication.  After you fill your prescription, take 1 week's worth of pills and put them away in a safe place. You should keep a separate, properly labeled bottle for this purpose. The remainder should be kept in the original bottle. Use this as your primary supply, until it runs out. Once it's gone, then you know that you have 1 week's worth of medicine, and it is time to come in for a prescription refill. If you do this correctly, it is unlikely that you will ever run out of medicine.  To make sure that the above recommendation works, it is very important that you make sure your medication refill appointments are scheduled at least 1 week before you run out of medicine. To do this in an effective manner, make sure that you do not leave the office without scheduling your next medication management appointment. Always ask the nursing staff to show you in your prescription , when your medication will be running out. Then arrange for the receptionist to get you a return appointment, at least 7 days before you run out of medicine. Do not wait until you have 1 or 2 pills left, to come in. This is very poor planning and does not take into consideration that we may need to cancel appointments due to bad weather, sickness, or emergencies affecting our staff.  DO NOT ACCEPT A "Partial Fill": If for any reason your pharmacy does not have enough pills/tablets  to completely fill or refill your prescription, do not allow for a "partial fill". The law allows the pharmacy to complete that prescription within 72 hours, without requiring a new prescription. If they do not fill the rest of your prescription within those 72 hours, you will need a separate prescription to fill the remaining amount, which we will NOT provide. If the reason for the partial fill is your insurance, you will need to talk to the pharmacist about payment alternatives for the remaining tablets, but again, DO NOT ACCEPT A PARTIAL FILL, unless you can trust your pharmacist to obtain the remainder of the pills within 72 hours.  Prescription refills and/or changes in medication(s):   Prescription refills, and/or changes in dose or medication, will be conducted only during scheduled medication management appointments. (Applies to both, written and electronic prescriptions.)  No refills on procedure days. No medication will be changed or started on procedure days. No changes, adjustments, and/or refills will be conducted on a procedure day. Doing so will interfere with the diagnostic portion of the procedure.  No phone refills. No medications will be "called into the pharmacy".  No Fax refills.  No weekend refills.  No Holliday refills.  No after hours refills.  Remember:  Business hours are:  Monday to Thursday 8:00 AM to 4:00 PM Provider's Schedule: Dai Apel, MD - Appointments are:  Medication management: Monday and Wednesday 8:00 AM to 4:00 PM Procedure day: Tuesday and Thursday 7:30 AM to 4:00 PM Bilal Lateef, MD - Appointments are:  Medication management: Tuesday and Thursday 8:00 AM to 4:00 PM Procedure day: Monday and Wednesday 7:30 AM to 4:00 PM (Last update: 02/25/2020) ____________________________________________________________________________________________    

## 2020-06-23 ENCOUNTER — Encounter: Payer: Medicare Other | Admitting: Pain Medicine

## 2020-07-08 ENCOUNTER — Other Ambulatory Visit: Payer: Self-pay | Admitting: Family Medicine

## 2020-07-08 NOTE — Telephone Encounter (Signed)
Last office visit 09/01/2019 for CPE.  Last refilled 04/22/2020 for #40 with no refills.  No future appointments with PCP.

## 2020-07-13 DIAGNOSIS — I495 Sick sinus syndrome: Secondary | ICD-10-CM | POA: Diagnosis not present

## 2020-07-29 ENCOUNTER — Other Ambulatory Visit: Payer: Self-pay

## 2020-07-29 NOTE — Telephone Encounter (Signed)
Received faxed message from [facility] requesting 90-day naproxen 500 mg tab rx to maximize savings with insurance.  Last OV:  09/01/19, AWV prt 2  Next OV:  09/14/20, AWV prt 2

## 2020-08-02 MED ORDER — NAPROXEN 500 MG PO TABS: 500.0000 mg | ORAL_TABLET | ORAL | 0 refills | Status: DC | PRN

## 2020-08-02 NOTE — Telephone Encounter (Signed)
ERx 

## 2020-08-11 ENCOUNTER — Telehealth: Payer: Self-pay

## 2020-08-11 DIAGNOSIS — G894 Chronic pain syndrome: Secondary | ICD-10-CM

## 2020-08-11 MED ORDER — NAPROXEN 500 MG PO TABS: 500.0000 mg | ORAL_TABLET | Freq: Two times a day (BID) | ORAL | 0 refills | Status: DC | PRN

## 2020-08-11 NOTE — Telephone Encounter (Signed)
New Rx sent to pharmacy

## 2020-08-11 NOTE — Telephone Encounter (Signed)
Received fax from pharmacy needing clarification on medication to refill prescription.   Naproxen 500 mg Tablets  Take 1 tablet (500 mg) by mouth as needed.  Requires clarification on frequency. What would you like the frequency to be?

## 2020-08-12 MED ORDER — NAPROXEN 500 MG PO TABS: 500.0000 mg | ORAL_TABLET | Freq: Two times a day (BID) | ORAL | 0 refills | Status: DC | PRN

## 2020-08-12 NOTE — Addendum Note (Signed)
Addended by: Sherrie George on: 08/12/2020 03:34 PM   Modules accepted: Orders

## 2020-08-12 NOTE — Telephone Encounter (Signed)
Received another fax from [facility] pharmacy requesting refill of naproxen. Noticed script sent in yestereday by PCP yesterday was sent to wrong pharmacy. Sent in script to [facility].

## 2020-08-26 ENCOUNTER — Telehealth: Payer: Self-pay

## 2020-08-26 ENCOUNTER — Other Ambulatory Visit: Payer: Self-pay | Admitting: Family Medicine

## 2020-08-26 DIAGNOSIS — N401 Enlarged prostate with lower urinary tract symptoms: Secondary | ICD-10-CM

## 2020-08-26 DIAGNOSIS — N138 Other obstructive and reflux uropathy: Secondary | ICD-10-CM

## 2020-08-26 DIAGNOSIS — D509 Iron deficiency anemia, unspecified: Secondary | ICD-10-CM

## 2020-08-26 DIAGNOSIS — R7303 Prediabetes: Secondary | ICD-10-CM

## 2020-08-26 DIAGNOSIS — E785 Hyperlipidemia, unspecified: Secondary | ICD-10-CM

## 2020-08-26 NOTE — Telephone Encounter (Signed)
LVM that clinic is opening @ 10am on Friday 08/26/2020, told pt to come after 10 or call to r/s appt 

## 2020-08-27 ENCOUNTER — Ambulatory Visit (INDEPENDENT_AMBULATORY_CARE_PROVIDER_SITE_OTHER): Payer: Medicare Other

## 2020-08-27 ENCOUNTER — Other Ambulatory Visit: Payer: Medicare Other

## 2020-08-27 DIAGNOSIS — Z0000 Encounter for general adult medical examination without abnormal findings: Secondary | ICD-10-CM | POA: Diagnosis not present

## 2020-08-27 NOTE — Progress Notes (Signed)
Subjective:   Mark [name] is a 65 y.o. male who presents for Medicare Annual/Subsequent preventive examination.  Review of Systems: N/A      I connected with the patient today by telephone and verified that I am speaking with the correct person using two identifiers. Location patient: home Location nurse: work Persons participating in the telephone visit: patient, nurse.   I discussed the limitations, risks, security and privacy concerns of performing an evaluation and management service by telephone and the availability of in person appointments. I also discussed with the patient that there may be a patient responsible charge related to this service. The patient expressed understanding and verbally consented to this telephonic visit.        Cardiac Risk Factors include: advanced age (>16mn, >>5women);male gender;Other (see comment), Risk factor comments: hyperlipidemia     Objective:    Today's Vitals   08/27/20 0809  PainSc: 4    There is no height or weight on file to calculate BMI.  Advanced Directives 08/27/2020 03/30/2020 03/11/2020 03/03/2020 08/20/2019 08/16/2018 12/13/2017  Does Patient Have a Medical Advance Directive? No No No No No No No  Type of Advance Directive - - - - - - -  Does patient want to make changes to medical advance directive? No - Patient declined - - - - - -  Copy of [place] Would patient like information on creating a medical advance directive? - - No - Patient declined No - Patient declined Yes (MAU/Ambulatory/Procedural Areas - Information given) No - Patient declined No - Patient declined    Current Medications (verified) Outpatient Encounter Medications as of 08/27/2020  Medication Sig  . Ascorbic Acid (VITAMIN C) 1000 MG tablet Take 1,000 mg by mouth daily.  .Marland Kitchenaspirin 81 MG tablet Take 81 mg by mouth daily.  .Marland Kitchenatorvastatin (LIPITOR) 20 MG tablet Take 1 tablet (20 mg total) by mouth at bedtime.  .  cetirizine (ZYRTEC) 10 MG tablet Take 10 mg by mouth at bedtime.   . Cholecalciferol (VITAMIN D) 2000 units CAPS Take 2,000 Units by mouth daily.   .Marland KitchenCRANBERRY PO Take 4,200 mg by mouth at bedtime.   . cyclobenzaprine (FLEXERIL) 10 MG tablet TAKE 1 TABLET BY MOUTH TWICE DAILY AS NEEDED FOR MUSCLE SPASMS GENERIC EQUIVALENT FOR FLEXERIL  . desonide (DESOWEN) 0.05 % ointment Apply 1 application topically 2 (two) times daily as needed (for rash on chin due to dryness).  . diphenhydrAMINE (BENADRYL) 50 MG capsule Take 50 mg by mouth at bedtime.  . docusate sodium (COLACE) 100 MG capsule Take 100 mg by mouth 2 (two) times daily as needed.   .Marland KitchenFLUoxetine (PROZAC) 20 MG capsule Take 1 capsule (20 mg total) by mouth daily.  .Marland KitchenFLUoxetine (PROZAC) 40 MG capsule Take 1 capsule (40 mg total) by mouth daily.  . fluticasone (FLONASE) 50 MCG/ACT nasal spray Place 2 sprays into both nostrils daily.  . furosemide (LASIX) 20 MG tablet Take 1 tablet (20 mg total) by mouth daily.  . Garlic 16389MG CAPS Take 1,000 mg by mouth daily.  . Melatonin 10 MG TABS Take 10 mg by mouth at bedtime as needed.  . Multiple Vitamin (MULTIVITAMIN PO) Take 1 tablet by mouth daily.  . Multiple Vitamins-Minerals (CERTAVITE SENIOR/ANTIOXIDANT) TABS   . naloxone (NARCAN) 4 MG/0.1ML LIQD nasal spray kit Spray into one nostril. Repeat with second device into other nostril after 2-3 minutes  if no or minimal response.  . naproxen (NAPROSYN) 500 MG tablet Take 1 tablet (500 mg total) by mouth 2 (two) times daily as needed for moderate pain.  . Omega-3 1000 MG CAPS Take 1,000 mg by mouth at bedtime.  Marland Kitchen omeprazole (PRILOSEC) 20 MG capsule Take 1 capsule (20 mg total) by mouth daily as needed (GERD).  . Oxycodone HCl 10 MG TABS Take 1 tablet (10 mg total) by mouth every 6 (six) hours as needed. Must last 30 days  . potassium citrate (UROCIT-K) 10 MEQ (1080 MG) SR tablet Take 1 tablet (10 mEq total) by mouth 2 (two) times daily.  Marland Kitchen rOPINIRole  (REQUIP) 1 MG tablet Take 1 tablet (1 mg total) by mouth at bedtime.  . Saw Palmetto, Serenoa repens, (SAW PALMETTO PO) Take 450 mg by mouth 2 (two) times daily.   . sildenafil (REVATIO) 20 MG tablet Take 3-5 tablets (60-100 mg total) by mouth daily as needed (for erectile dysfunction.).  Marland Kitchen VITAMIN B COMPLEX-C CAPS Take by mouth daily.   . vitamin E 1000 UNIT capsule Take 1,000 Units by mouth daily.   . Oxycodone HCl 10 MG TABS Take 1 tablet (10 mg total) by mouth every 6 (six) hours as needed. Must last 30 days  . Oxycodone HCl 10 MG TABS Take 1 tablet (10 mg total) by mouth every 6 (six) hours as needed. Must last 30 days   No facility-administered encounter medications on file as of 08/27/2020.    Allergies (verified) Ciprofloxacin, Metformin, Buprenorphine hcl, Morphine and related, and Penicillins   History: Past Medical History:  Diagnosis Date  . Anxiety   . Arthritis   . Chronic pain   . Depression   . Heart murmur   . History of kidney stones April 12,2017   removed two kidney stones about 1 cm in size  . History of permanent cardiac pacemaker placement 05/31/2015  . Hydrocele in adult 05/07/2015  . Kidney stones   . Migraine   . MRSA carrier 2019  . Presence of permanent cardiac pacemaker   . Prostatitis   . Sleep apnea   . Sleep apnea    getting new cpap machine   Past Surgical History:  Procedure Laterality Date  . CARDIOVASCULAR STRESS TEST  2014   Dr Ubaldo Glassing  . CARPAL TUNNEL RELEASE Bilateral 2006  . CERVICAL SPINE SURGERY  2008   C5/6  . COLONOSCOPY  07/2007   prostate nodule, hemorrhoid, rpt 5 yrs [name])  . IMPLANTABLE CARDIOVERTER DEFIBRILLATOR (ICD) GENERATOR CHANGE Left 12/13/2017   Procedure: PACER CHANGE OUT;  Surgeon: Isaias Cowman, MD;  Location: [facility] ORS;  Service: Cardiovascular;  Laterality: Left;  . LITHOTRIPSY Bilateral 2012   Stoioff  . LITHOTRIPSY Left 11/2015   x2 Stoioff  . pace maker revision  12/13/2017  . PACEMAKER PLACEMENT   2009   s/p Medtronic Adapta DR Pacemaker (Dr. Ubaldo Glassing)  . ROTATOR CUFF REPAIR Left 2009  . TONSILLECTOMY  1964  . ULNAR NERVE REPAIR  2008   Left   Family History  Problem Relation Age of Onset  . Heart disease Mother   . Diabetes Mother   . Arthritis Mother   . Cancer Mother        Breast Cancer  . Heart block Father   . Heart disease Father   . Arthritis Father   . Cancer Father        skin cancer   Social History   Socioeconomic History  . Marital status: Divorced  Spouse name: Not on file  . Number of children: Not on file  . Years of education: Not on file  . Highest education level: Not on file  Occupational History  . Not on file  Tobacco Use  . Smoking status: Never Smoker  . Smokeless tobacco: Never Used  Vaping Use  . Vaping Use: Never used  Substance and Sexual Activity  . Alcohol use: No  . Drug use: No  . Sexual activity: Never  Other Topics Concern  . Not on file  Social History Narrative   Divorced. Lives with wife.   Edu: 8th grade   Occ: retired, disability for chronic neck pain    Activity: no regular exercise - limited by back and knee pain   Diet: poor water, lots of tang and gatorade, no vegetables or fruits   Social Determinants of Health   Financial Resource Strain: Low Risk   . Difficulty of Paying Living Expenses: Not hard at all  Food Insecurity: No Food Insecurity  . Worried About Charity fundraiser in the Last Year: Never true  . Ran Out of Food in the Last Year: Never true  Transportation Needs: No Transportation Needs  . Lack of Transportation (Medical): No  . Lack of Transportation (Non-Medical): No  Physical Activity: Sufficiently Active  . Days of Exercise per Week: 7 days  . Minutes of Exercise per Session: 30 min  Stress: No Stress Concern Present  . Feeling of Stress : Not at all  Social Connections: Not on file    Tobacco Counseling Counseling given: Not Answered   Clinical Intake:  Pre-visit preparation  completed: Yes  Pain : 0-10 Pain Score: 4  Pain Type: Chronic pain Pain Location: Neck Pain Descriptors / Indicators: Aching Pain Onset: More than a month ago Pain Frequency: Intermittent     Nutritional Risks: None Diabetes: No  How often do you need to have someone help you when you read instructions, pamphlets, or other written materials from your doctor or pharmacy?: 1 - Never What is the last grade level you completed in school?: 8th  Diabetic: No Nutrition Risk Assessment:  Has the patient had any N/V/D within the last 2 months?  No  Does the patient have any non-healing wounds?  No  Has the patient had any unintentional weight loss or weight gain?  No   Diabetes:  Is the patient diabetic?  No  If diabetic, was a CBG obtained today?  N/A Did the patient bring in their glucometer from home?  N/A How often do you monitor your CBG's? N/A.   Financial Strains and Diabetes Management:  Are you having any financial strains with the device, your supplies or your medication? N/A.  Does the patient want to be seen by Chronic Care Management for management of their diabetes?  N/A Would the patient like to be referred to a Nutritionist or for Diabetic Management?  N/A    Interpreter Needed?: No  Information entered by :: CJohnson, LPN   Activities of Daily Living In your present state of health, do you have any difficulty performing the following activities: 08/27/2020  Hearing? N  Vision? N  Difficulty concentrating or making decisions? N  Walking or climbing stairs? N  Dressing or bathing? N  Doing errands, shopping? N  Preparing Food and eating ? N  Using the Toilet? N  In the past six months, have you accidently leaked urine? N  Do you have problems with loss of bowel control? N  Managing your Medications? N  Managing your Finances? N  Housekeeping or managing your Housekeeping? N  Some recent data might be hidden    Patient Care Team: Ria Bush,  MD as PCP - General (Family Medicine) Hyman Hopes, [place], [place] as Referring Physician (Dentistry) Milinda Pointer, MD as Referring Physician (Pain Medicine) Teodoro Spray, MD as Consulting Physician (Cardiology) Laverle Hobby, MD as Consulting Physician (Pulmonary Disease) Abbie Sons, MD (Urology) Leonie Green, OD as Referring Physician (Optometry)  Indicate any recent Medical Services you may have received from other than [facility] providers in the past year (date may be approximate).     Assessment:   This is a routine wellness examination for [place].  Hearing/Vision screen  Hearing Screening   125Hz  250Hz  500Hz  1000Hz  2000Hz  3000Hz  4000Hz  6000Hz  8000Hz   Right ear:           Left ear:           Vision Screening Comments: Patient gets eye exams every 3-4 years   Dietary issues and exercise activities discussed: Current Exercise Habits: Home exercise routine, Type of exercise: walking, Time (Minutes): 30, Frequency (Times/Week): 7, Weekly Exercise (Minutes/Week): 210, Intensity: Moderate, Exercise limited by: None identified  Goals    . Follow up with Primary Care Provider     Starting 08/16/2018, I will continue to take medications as prescribed and to keep appointments with PCP as scheduled.     . Patient Stated     08/20/2019, I will maintain and continue medications as prescribed.    . Patient Stated     08/27/2020, I will continue to walk daily for about 30 minutes.       Depression Screen PHQ 2/9 Scores 08/27/2020 03/11/2020 03/03/2020 08/20/2019 08/16/2018 05/14/2018 02/11/2018  PHQ - 2 Score 0 0 0 0 0 1 1  PHQ- 9 Score 0 - - 0 0 - 6  Exception Documentation - - - - - - -    Fall Risk Fall Risk  08/27/2020 06/14/2020 03/11/2020 03/03/2020 08/20/2019  Falls in the past year? 0 0 0 0 0  Number falls in past yr: 0 - - 0 0  Injury with Fall? 0 - - 0 0  Risk for fall due to : Medication side effect - - - Medication side effect  Follow up Falls evaluation completed;Falls  prevention discussed - - Falls evaluation completed Falls evaluation completed;Falls prevention discussed    FALL RISK PREVENTION PERTAINING TO THE HOME:  Any stairs in or around the home? Yes  If so, are there any without handrails? No  Home free of loose throw rugs in walkways, pet beds, electrical cords, etc? Yes  Adequate lighting in your home to reduce risk of falls? Yes   ASSISTIVE DEVICES UTILIZED TO PREVENT FALLS:  Life alert? No  Use of a cane, walker or w/c? No  Grab bars in the bathroom? No  Shower chair or bench in shower? No  Elevated toilet seat or a handicapped toilet? No   TIMED UP AND GO:  Was the test performed? N/A ,telephone visit .   Cognitive Function: MMSE - Mini Mental State Exam 08/27/2020 08/20/2019 08/16/2018 08/13/2017 08/04/2016  Orientation to time 5 5 5 5 5   Orientation to Place 5 5 5 5 5   Registration 3 3 3 3 3   Attention/ Calculation 5 0 0 0 0  Attention/Calculation-comments - can't spell - - -  Recall 3 3 2 3 1   Recall-comments - - unable to recall 1  of 3 words - pt was unable to recall 2 of 3 words  Language- name 2 objects - - 0 0 0  Language- repeat 1 1 1 1 1   Language- follow 3 step command - - 3 3 3   Language- read & follow direction - - 0 0 0  Write a sentence - - 0 0 0  Copy design - - 0 0 0  Total score - - 19 20 18   Mini Cog  Mini-Cog screen was completed. Maximum score is 22. A value of 0 denotes this part of the MMSE was not completed or the patient failed this part of the Mini-Cog screening.       Immunizations Immunization History  Administered Date(s) Administered  . Influenza,inj,Quad PF,6+ Mos 04/22/2013, 04/28/2014, 05/07/2015, 04/11/2016, 05/22/2017, 05/16/2018  . Influenza-Unspecified 06/16/2019, 04/15/2020  . Moderna Sars-Covid-2 Vaccination 11/06/2019, 12/04/2019, 07/07/2020  . Pneumococcal Polysaccharide-23 05/30/2006  . Tdap 11/19/2009, 04/15/2020    TDAP status: Due, Education has been provided regarding the  importance of this vaccine. Advised may receive this vaccine at local pharmacy or Health Dept. Aware to provide a copy of the vaccination record if obtained from local pharmacy or Health Dept. Verbalized acceptance and understanding.  Flu Vaccine status: Up to date  Pneumococcal vaccine status: Up to date  Covid-19 vaccine status: Completed vaccines   Qualifies for Shingles Vaccine? Yes   Zostavax completed No   Shingrix Completed?: No.    Education has been provided regarding the importance of this vaccine. Patient has been advised to call insurance company to determine out of pocket expense if they have not yet received this vaccine. Advised may also receive vaccine at local pharmacy or Health Dept. Verbalized acceptance and understanding.  Screening Tests Health Maintenance  Topic Date Due  . Fecal DNA (Cologuard)  09/21/2020  . TETANUS/TDAP  04/15/2030  . INFLUENZA VACCINE  Completed  . COVID-19 Vaccine  Completed  . Hepatitis C Screening  Completed  . HIV Screening  Completed    Health Maintenance  There are no preventive care reminders to display for this patient.  Colorectal cancer screening: Type of screening: Cologuard. Completed 09/21/2017. Repeat every 3 years  Lung Cancer Screening: (Low Dose CT Chest recommended if Age 76-80 years, 30 pack-year currently smoking OR have quit w/in 15 years.) does not qualify.    Additional Screening:  Hepatitis C Screening: does qualify; Completed 07/22/2013  Vision Screening: Recommended annual ophthalmology exams for early detection of glaucoma and other disorders of the eye. Is the patient up to date with their annual eye exam?  No  Patient gets eye exams every 3-4 years. Who is the provider or what is the name of the office in which the patient attends annual eye exams? Dr. Shanon Brow If pt is not established with a provider, would they like to be referred to a provider to establish care? No .   Dental Screening: Recommended  annual dental exams for proper oral hygiene  Community Resource Referral / Chronic Care Management: CRR required this visit?  No   CCM required this visit?  No      Plan:     I have personally reviewed and noted the following in the patient's chart:   . Medical and social history . Use of alcohol, tobacco or illicit drugs  . Current medications and supplements . Functional ability and status . Nutritional status . Physical activity . Advanced directives . List of other physicians . Hospitalizations, surgeries, and ER visits in  previous 12 months . Vitals . Screenings to include cognitive, depression, and falls . Referrals and appointments  In addition, I have reviewed and discussed with patient certain preventive protocols, quality metrics, and best practice recommendations. A written personalized care plan for preventive services as well as general preventive health recommendations were provided to patient.   Due to this being a telephonic visit, the after visit summary with patients personalized plan was offered to patient via office or my-chart. Patient preferred to pick up at office at next visit or via mychart.   Andrez Grime, LPN   [id number]

## 2020-08-27 NOTE — Progress Notes (Signed)
PCP notes:  Health Maintenance: No gaps noted    Abnormal Screenings: none   Patient concerns: none   Nurse concerns: none   Next PCP appt: 09/14/2020 @ 3:30 pm

## 2020-08-27 NOTE — Patient Instructions (Signed)
Mark [name] , Thank you for taking time to come for your Medicare Wellness Visit. I appreciate your ongoing commitment to your health goals. Please review the following plan we discussed and let me know if I can assist you in the future.   Screening recommendations/referrals: Colonoscopy: Cologuard completed 09/21/2017, due 09/21/2020 Recommended yearly ophthalmology/optometry visit for glaucoma screening and checkup Recommended yearly dental visit for hygiene and checkup  Vaccinations: Influenza vaccine: Up to date, completed 04/15/2020, due 03/2021 Pneumococcal vaccine: Up to date, completed 05/30/2006, next due at age 70  Tdap vaccine: decline-insurance Shingles vaccine: due, check with your insurance regarding coverage if interested    Covid-19: Completed series  Advanced directives: Advance directive discussed with you today. Even though you declined this today please call our office should you change your mind and we can give you the proper paperwork for you to fill out.  Conditions/risks identified: hyperlipidemia   Next appointment: Follow up in one year for your annual wellness visit.   Preventive Care 23-54 Years Old, Male Preventive care refers to lifestyle choices and visits with your health care provider that can promote health and wellness. What does preventive care include?  A yearly physical exam. This is also called an annual well check.  Dental exams once or twice a year.  Routine eye exams. Ask your health care provider how often you should have your eyes checked.  Personal lifestyle choices, including:  Daily care of your teeth and gums.  Regular physical activity.  Eating a healthy diet.  Avoiding tobacco and drug use.  Limiting alcohol use.  Practicing safe sex.  Taking low doses of aspirin every day.  Taking vitamin and mineral supplements as recommended by your health care provider. What happens during an annual well check? The services and screenings  done by your health care provider during your annual well check will depend on your age, overall health, lifestyle risk factors, and family history of disease. Counseling  Your health care provider may ask you questions about your:  Alcohol use.  Tobacco use.  Drug use.  Emotional well-being.  Home and relationship well-being.  Sexual activity.  Eating habits.  History of falls.  Memory and ability to understand (cognition).  Work and work Astronomer. Screening  You may have the following tests or measurements:  Height, weight, and BMI.  Blood pressure.  Lipid and cholesterol levels. These may be checked every 5 years, or more frequently if you are over 57 years old.  Skin check.  Lung cancer screening. You may have this screening every year starting at age 31 if you have a 30-pack-year history of smoking and currently smoke or have quit within the past 15 years.  Fecal occult blood test (FOBT) of the stool. You may have this test every year starting at age 10.  Flexible sigmoidoscopy or colonoscopy. You may have a sigmoidoscopy every 5 years or a colonoscopy every 10 years starting at age 78.  Prostate cancer screening. Recommendations will vary depending on your family history and other risks.  Hepatitis C blood test.  Hepatitis B blood test.  Sexually transmitted disease (STD) testing.  Diabetes screening. This is done by checking your blood sugar (glucose) after you have not eaten for a while (fasting). You may have this done every 1-3 years.  Abdominal aortic aneurysm (AAA) screening. You may need this if you are a current or former smoker.  Osteoporosis. You may be screened starting at age 79 if you are at high risk. Talk  with your health care provider about your test results, treatment options, and if necessary, the need for more tests. Vaccines  Your health care provider may recommend certain vaccines, such as:  Influenza vaccine. This is recommended  every year.  Tetanus, diphtheria, and acellular pertussis (Tdap, Td) vaccine. You may need a Td booster every 10 years.  Zoster vaccine. You may need this after age 11.  Pneumococcal 13-valent conjugate (PCV13) vaccine. One dose is recommended after age 34.  Pneumococcal polysaccharide (PPSV23) vaccine. One dose is recommended after age 83. Talk to your health care provider about which screenings and vaccines you need and how often you need them. This information is not intended to replace advice given to you by your health care provider. Make sure you discuss any questions you have with your health care provider. Document Released: 08/20/2015 Document Revised: 04/12/2016 Document Reviewed: 05/25/2015 Elsevier Interactive Patient Education  2017 [place] Prevention in the Home Falls can cause injuries. They can happen to people of all ages. There are many things you can do to make your home safe and to help prevent falls. What can I do on the outside of my home?  Regularly fix the edges of walkways and driveways and fix any cracks.  Remove anything that might make you trip as you walk through a door, such as a raised step or threshold.  Trim any bushes or trees on the path to your home.  Use bright outdoor lighting.  Clear any walking paths of anything that might make someone trip, such as rocks or tools.  Regularly check to see if handrails are loose or broken. Make sure that both sides of any steps have handrails.  Any raised decks and porches should have guardrails on the edges.  Have any leaves, snow, or ice cleared regularly.  Use sand or salt on walking paths during winter.  Clean up any spills in your garage right away. This includes oil or grease spills. What can I do in the bathroom?  Use night lights.  Install grab bars by the toilet and in the tub and shower. Do not use towel bars as grab bars.  Use non-skid mats or decals in the tub or shower.  If  you need to sit down in the shower, use a plastic, non-slip stool.  Keep the floor dry. Clean up any water that spills on the floor as soon as it happens.  Remove soap buildup in the tub or shower regularly.  Attach bath mats securely with double-sided non-slip rug tape.  Do not have throw rugs and other things on the floor that can make you trip. What can I do in the bedroom?  Use night lights.  Make sure that you have a light by your bed that is easy to reach.  Do not use any sheets or blankets that are too big for your bed. They should not hang down onto the floor.  Have a firm chair that has side arms. You can use this for support while you get dressed.  Do not have throw rugs and other things on the floor that can make you trip. What can I do in the kitchen?  Clean up any spills right away.  Avoid walking on wet floors.  Keep items that you use a lot in easy-to-reach places.  If you need to reach something above you, use a strong step stool that has a grab bar.  Keep electrical cords out of the way.  Do  not use floor polish or wax that makes floors slippery. If you must use wax, use non-skid floor wax.  Do not have throw rugs and other things on the floor that can make you trip. What can I do with my stairs?  Do not leave any items on the stairs.  Make sure that there are handrails on both sides of the stairs and use them. Fix handrails that are broken or loose. Make sure that handrails are as long as the stairways.  Check any carpeting to make sure that it is firmly attached to the stairs. Fix any carpet that is loose or worn.  Avoid having throw rugs at the top or bottom of the stairs. If you do have throw rugs, attach them to the floor with carpet tape.  Make sure that you have a light switch at the top of the stairs and the bottom of the stairs. If you do not have them, ask someone to add them for you. What else can I do to help prevent falls?  Wear shoes  that:  Do not have high heels.  Have rubber bottoms.  Are comfortable and fit you well.  Are closed at the toe. Do not wear sandals.  If you use a stepladder:  Make sure that it is fully opened. Do not climb a closed stepladder.  Make sure that both sides of the stepladder are locked into place.  Ask someone to hold it for you, if possible.  Clearly mark and make sure that you can see:  Any grab bars or handrails.  First and last steps.  Where the edge of each step is.  Use tools that help you move around (mobility aids) if they are needed. These include:  Canes.  Walkers.  Scooters.  Crutches.  Turn on the lights when you go into a dark area. Replace any light bulbs as soon as they burn out.  Set up your furniture so you have a clear path. Avoid moving your furniture around.  If any of your floors are uneven, fix them.  If there are any pets around you, be aware of where they are.  Review your medicines with your doctor. Some medicines can make you feel dizzy. This can increase your chance of falling. Ask your doctor what other things that you can do to help prevent falls. This information is not intended to replace advice given to you by your health care provider. Make sure you discuss any questions you have with your health care provider. Document Released: 05/20/2009 Document Revised: 12/30/2015 Document Reviewed: 08/28/2014 Elsevier Interactive Patient Education  2017 [employer].

## 2020-09-07 ENCOUNTER — Other Ambulatory Visit: Payer: Self-pay

## 2020-09-07 ENCOUNTER — Other Ambulatory Visit (INDEPENDENT_AMBULATORY_CARE_PROVIDER_SITE_OTHER): Payer: Medicare Other

## 2020-09-07 DIAGNOSIS — N401 Enlarged prostate with lower urinary tract symptoms: Secondary | ICD-10-CM | POA: Diagnosis not present

## 2020-09-07 DIAGNOSIS — E785 Hyperlipidemia, unspecified: Secondary | ICD-10-CM | POA: Diagnosis not present

## 2020-09-07 DIAGNOSIS — N138 Other obstructive and reflux uropathy: Secondary | ICD-10-CM | POA: Diagnosis not present

## 2020-09-07 DIAGNOSIS — R7303 Prediabetes: Secondary | ICD-10-CM

## 2020-09-07 DIAGNOSIS — D509 Iron deficiency anemia, unspecified: Secondary | ICD-10-CM

## 2020-09-07 LAB — COMPREHENSIVE METABOLIC PANEL
ALT: 29 U/L (ref 0–53)
AST: 26 U/L (ref 0–37)
Albumin: 4 g/dL (ref 3.5–5.2)
Alkaline Phosphatase: 56 U/L (ref 39–117)
BUN: 20 mg/dL (ref 6–23)
CO2: 29 mEq/L (ref 19–32)
Calcium: 9.4 mg/dL (ref 8.4–10.5)
Chloride: 104 mEq/L (ref 96–112)
Creatinine, Ser: 0.88 mg/dL (ref 0.40–1.50)
GFR: 91.41 mL/min (ref >60.00)
Glucose, Bld: 120 mg/dL — ABNORMAL HIGH (ref 70–99)
Potassium: 4.2 mEq/L (ref 3.5–5.1)
Sodium: 137 mEq/L (ref 135–145)
Total Bilirubin: 0.8 mg/dL (ref 0.2–1.2)
Total Protein: 6.1 g/dL (ref 6.0–8.3)

## 2020-09-07 LAB — LIPID PANEL
Cholesterol: 109 mg/dL (ref 0–200)
HDL: 40 mg/dL (ref >39.00)
LDL Cholesterol: 46 mg/dL (ref 0–99)
NonHDL: 69.4
Total CHOL/HDL Ratio: 3
Triglycerides: 118 mg/dL (ref 0.0–149.0)
VLDL: 23.6 mg/dL (ref 0.0–40.0)

## 2020-09-07 LAB — CBC WITH DIFFERENTIAL/PLATELET
Basophils Absolute: 0.1 10*3/uL (ref 0.0–0.1)
Basophils Relative: 1.2 % (ref 0.0–3.0)
Eosinophils Absolute: 0.2 10*3/uL (ref 0.0–0.7)
Eosinophils Relative: 3.1 % (ref 0.0–5.0)
HCT: 43.6 % (ref 39.0–52.0)
Hemoglobin: 15.4 g/dL (ref 13.0–17.0)
Lymphocytes Relative: 25.8 % (ref 12.0–46.0)
Lymphs Abs: 1.8 10*3/uL (ref 0.7–4.0)
MCHC: 35.3 g/dL (ref 30.0–36.0)
MCV: 87.6 fl (ref 78.0–100.0)
Monocytes Absolute: 0.8 10*3/uL (ref 0.1–1.0)
Monocytes Relative: 11.8 % (ref 3.0–12.0)
Neutro Abs: 3.9 10*3/uL (ref 1.4–7.7)
Neutrophils Relative %: 58.1 % (ref 43.0–77.0)
Platelets: 162 10*3/uL (ref 150.0–400.0)
RBC: 4.98 Mil/uL (ref 4.22–5.81)
RDW: 13.1 % (ref 11.5–15.5)
WBC: 6.8 10*3/uL (ref 4.0–10.5)

## 2020-09-07 LAB — IBC PANEL
Iron: 97 ug/dL (ref 42–165)
Saturation Ratios: 33.8 % (ref 20.0–50.0)
Transferrin: 205 mg/dL — ABNORMAL LOW (ref 212.0–360.0)

## 2020-09-07 LAB — FERRITIN: Ferritin: 202.5 ng/mL (ref 22.0–322.0)

## 2020-09-07 LAB — HEMOGLOBIN A1C: Hgb A1c MFr Bld: 5.6 % (ref 4.6–6.5)

## 2020-09-07 LAB — PSA: PSA: 0.78 ng/mL (ref 0.10–4.00)

## 2020-09-08 ENCOUNTER — Ambulatory Visit
Admission: RE | Admit: 2020-09-08 | Discharge: 2020-09-08 | Disposition: A | Discharge status: Home / Self Care | Payer: Medicare Other | Source: Ambulatory Visit | Attending: Urology | Admitting: Urology

## 2020-09-08 ENCOUNTER — Other Ambulatory Visit: Payer: Self-pay

## 2020-09-08 ENCOUNTER — Encounter: Payer: Self-pay | Admitting: Pain Medicine

## 2020-09-08 ENCOUNTER — Ambulatory Visit (HOSPITAL_BASED_OUTPATIENT_CLINIC_OR_DEPARTMENT_OTHER): Payer: Medicare Other | Admitting: Pain Medicine

## 2020-09-08 VITALS — BP 140/79 | HR 65 | Temp 97.2°F | Resp 20 | Ht 67.0 in | Wt 226.0 lb

## 2020-09-08 DIAGNOSIS — F1120 Opioid dependence, uncomplicated: Secondary | ICD-10-CM | POA: Insufficient documentation

## 2020-09-08 DIAGNOSIS — M961 Postlaminectomy syndrome, not elsewhere classified: Secondary | ICD-10-CM | POA: Insufficient documentation

## 2020-09-08 DIAGNOSIS — G8929 Other chronic pain: Secondary | ICD-10-CM

## 2020-09-08 DIAGNOSIS — M542 Cervicalgia: Secondary | ICD-10-CM

## 2020-09-08 DIAGNOSIS — Z79899 Other long term (current) drug therapy: Secondary | ICD-10-CM

## 2020-09-08 DIAGNOSIS — G894 Chronic pain syndrome: Secondary | ICD-10-CM | POA: Insufficient documentation

## 2020-09-08 DIAGNOSIS — N200 Calculus of kidney: Secondary | ICD-10-CM

## 2020-09-08 DIAGNOSIS — I878 Other specified disorders of veins: Secondary | ICD-10-CM | POA: Diagnosis not present

## 2020-09-08 MED ORDER — OXYCODONE HCL 10 MG PO TABS: 10.0000 mg | ORAL_TABLET | Freq: Four times a day (QID) | ORAL | 0 refills | Status: DC | PRN

## 2020-09-08 NOTE — Progress Notes (Signed)
PROVIDER NOTE: Information contained herein reflects review and annotations entered in association with encounter. Interpretation of such information and data should be left to medically-trained personnel. Information provided to patient can be located elsewhere in the medical record under "Patient Instructions". Document created using STT-dictation technology, any transcriptional errors that may result from process are unintentional.    Patient: Mark [name]  Service Category: E/M  Provider: Gaspar Cola, MD  DOB: March 19, 1956  DOS: 09/08/2020  Specialty: Interventional Pain Management  MRN: [id number]  Setting: Ambulatory outpatient  PCP: Ria Bush, MD  Type: Established Patient    Referring Provider: Ria Bush, MD  Location: Office  Delivery: Face-to-face     HPI  [name], a 65 y.o. year old male, is here today because of his Chronic pain syndrome [G89.4]. [name] primary complain today is Neck Pain and Shoulder Pain (Left is worse than right) Last encounter: My last encounter with him was on 06/14/2020. Pertinent problems: 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); Cervicogenic headache; Cervical spondylosis with radiculopathy (Bilateral) (L>R); Cervical facet syndrome (Bilateral) (L>R); Cervical foraminal stenosis (Severe C6-7) (Left); Chronic sacroiliac joint pain (Bilateral); Osteoarthritis of sacroiliac joint (Bilateral); Lumbar facet hypertrophy (L1-2, L2-3, and L4-5) (Bilateral); Lumbar IVDD (intervertebral disc displacement); 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); Lumbar facet syndrome (Bilateral); Osteoarthritis of shoulder (Left); Osteoarthritis of knee (Bilateral) (R>L); Chronic cervical radicular pain;  Chronic fatigue; RLS (restless legs syndrome); Trigger finger of index finger (Right); Cervicalgia; Trigger finger of thumb (Right); Chronic thumb pain (Left); Trigger middle finger of right hand; Chronic hand pain (Left); Osteoarthritis of first carpometacarpal joint (thumb) of hand (Left); Trigger point with back pain (Left); and Trigger point of shoulder region (scapula) (Left) on their pertinent problem list. Pain Assessment: Severity of Chronic pain is reported as a 4 /10. Location: Neck  /shoulders. Onset: More than a month ago. Quality: Aching,Dull,Constant. Timing: Constant. Modifying factor(s):  Marland Kitchen Vitals:  height is 5' 7"  (1.702 m) and weight is 226 lb (102.5 kg). His temporal temperature is 97.2 F (36.2 C) (abnormal). His blood pressure is 140/79 and his pulse is 65. His respiration is 20 and oxygen saturation is 100%.   Reason for encounter: medication management.   The patient indicates doing well with the current medication regimen. No adverse reactions or side effects reported to the medications.   RTCB: 12/11/2020  Pharmacotherapy Assessment   Analgesic: Oxycodone IR 10 mg, 1 tab PO q 6 hrs (40 mg/day total of oxycodone) MME/day:60 mg/day.   Monitoring: [place] PMP: PDMP reviewed during this encounter.       Pharmacotherapy: No side-effects or adverse reactions reported. Compliance: No problems identified. Effectiveness: Clinically acceptable.  Hart Rochester, RN  09/08/2020  1:59 PM  Sign when Signing Visit Nursing Pain Medication Assessment:  Safety precautions to be maintained throughout the outpatient stay will include: orient to surroundings, keep bed in low position, maintain call bell within reach at all times, provide assistance with transfer out of bed and ambulation.  Medication Inspection Compliance: Pill count conducted under aseptic conditions, in front of the patient. Neither the pills nor the bottle was removed from the patient's sight at any time. Once count was  completed pills were immediately returned to the patient in their original bottle.  Medication: Oxycodone IR Pill/Patch  Count: 28 of 120 pills remain Pill/Patch Appearance: Markings consistent with prescribed medication Bottle Appearance: Standard pharmacy container. Clearly labeled. Filled Date: 01 / 07 / 2022 Last Medication intake:  Today    UDS:  Summary  Date Value Ref Range Status  01/06/2020 Note  Final    Comment:    ==================================================================== [employer] Select 13 (MW) ==================================================================== Test                             Result       Flag       Units Drug Present and Declared for Prescription Verification   Oxycodone                      2773         EXPECTED   ng/mg creat   Noroxycodone                   3993         EXPECTED   ng/mg creat    Sources of oxycodone include scheduled prescription medications.    Noroxycodone is an expected metabolite of oxycodone. ==================================================================== Test                      Result    Flag   Units      Ref Range   Creatinine              44               mg/dL      >=20 ==================================================================== Declared Medications:  The flagging and interpretation on this report are based on the  following declared medications.  Unexpected results may arise from  inaccuracies in the declared medications.  **Note: The testing scope of this panel includes these medications:  Oxycodone  **Note: The testing scope of this panel does not include the  following reported medications:  Fluoxetine (Prozac)  Fluticasone (Flonase)  Furosemide (Lasix)  Melatonin  Multivitamin  Naloxone (Narcan)  Naproxen  Omega-3 Fatty Acids  Omeprazole (Prilosec)  Potassium  Sildenafil  Supplement  Vitamin E ==================================================================== For clinical  consultation, please call [phone]. ====================================================================      ROS  Constitutional: Denies any fever or chills Gastrointestinal: No reported hemesis, hematochezia, vomiting, or acute GI distress Musculoskeletal: Denies any acute onset joint swelling, redness, loss of ROM, or weakness Neurological: No reported episodes of acute onset apraxia, aphasia, dysarthria, agnosia, amnesia, paralysis, loss of coordination, or loss of consciousness  Medication Review  Cert[employer]ite Senior/Antioxidant, Cranberry, FLUoxetine, Garlic, Melatonin, Multiple Vitamin, Omega-3, Oxycodone HCl, Saw Palmetto (Serenoa repens), Vitamin B Complex-C, Vitamin D, aspirin, atorvastatin, cetirizine, cyclobenzaprine, desonide, diphenhydrAMINE, docusate sodium, fluticasone, furosemide, naloxone, naproxen, omeprazole, potassium citrate, rOPINIRole, sildenafil, vitamin C, and vitamin E  History Review  Allergy: [name] is allergic to ciprofloxacin, metformin, buprenorphine hcl, morphine and related, and penicillins. Drug: [name]  reports no history of drug use. Alcohol:  reports no history of alcohol use. Tobacco:  reports that he has never smoked. He has never used smokeless tobacco. Social: [name]  reports that he has never smoked. He has never used smokeless tobacco. He reports that he does not drink alcohol and does not use drugs. Medical:  has a past medical history of Anxiety, Arthritis, Chronic pain, Depression, Heart murmur, History of kidney stones (April 12,2017), History of permanent cardiac pacemaker placement (05/31/2015), Hydrocele in adult (05/07/2015),  Kidney stones, Migraine, MRSA carrier (2019), Presence of permanent cardiac pacemaker, Prostatitis, Sleep apnea, and Sleep apnea. Surgical: [name]  has a past surgical history that includes Tonsillectomy (1964); Carpal tunnel release (Bilateral, 2006); Ulnar nerve repair (2008); Cervical spine surgery (2008);  Rotator cuff repair (Left, 2009); pacemaker placement (2009); Lithotripsy (Bilateral, 2012); Lithotripsy (Left, 11/2015); Cardiovascular stress test (2014); Colonoscopy (07/2007); implantable cardioverter defibrillator (icd) generator change (Left, 12/13/2017); and pace maker revision (12/13/2017). Family: family history includes Arthritis in his father and mother; Cancer in his father and mother; Diabetes in his mother; Heart block in his father; Heart disease in his father and mother.  Laboratory Chemistry Profile   Renal Lab Results  Component Value Date   BUN 20 09/07/2020   CREATININE 0.88 09/07/2020   GFR 91.41 09/07/2020   GFRAA >60 12/06/2017   GFRNONAA >60 12/06/2017     Hepatic Lab Results  Component Value Date   AST 26 09/07/2020   ALT 29 09/07/2020   ALBUMIN 4.0 09/07/2020   ALKPHOS 56 09/07/2020   HCVAB NEGATIVE 07/22/2013     Electrolytes Lab Results  Component Value Date   NA 137 09/07/2020   K 4.2 09/07/2020   CL 104 09/07/2020   CALCIUM 9.4 09/07/2020   MG 2.1 09/01/2015     Bone Lab Results  Component Value Date   25OHVITD1 63 02/02/2016   25OHVITD2 <1.0 02/02/2016   25OHVITD3 63 02/02/2016   TESTOSTERONE 216.29 (L) 02/13/2017     Inflammation (CRP: Acute Phase) (ESR: Chronic Phase) Lab Results  Component Value Date   CRP 1.7 (H) 09/01/2015   ESRSEDRATE 6 09/01/2015       Note: Above Lab results reviewed.  Recent Imaging Review  DG PAIN CLINIC C-ARM 1-60 MIN NO REPORT Fluoro was used, but no Radiologist interpretation will be provided.  Please refer to "NOTES" tab for provider progress note. Note: Reviewed        Physical Exam  General appearance: Well nourished, well developed, and well hydrated. In no apparent acute distress Mental status: Alert, oriented x 3 (person, place, & time)       Respiratory: No evidence of acute respiratory distress Eyes: PERLA Vitals: BP 140/79   Pulse 65   Temp (!) 97.2 F (36.2 C) (Temporal)   Resp 20    Ht 5' 7"  (1.702 m)   Wt 226 lb (102.5 kg)   SpO2 100%   BMI 35.40 kg/m  BMI: Estimated body mass index is 35.4 kg/m as calculated from the following:   Height as of this encounter: 5' 7"  (1.702 m).   Weight as of this encounter: 226 lb (102.5 kg). Ideal: Ideal body weight: 66.1 kg (145 lb 11.6 oz) Adjusted ideal body weight: 80.7 kg (177 lb 13.4 oz)  Assessment   Status Diagnosis  Controlled Controlled Controlled 1. Chronic pain syndrome   2. Chronic neck pain (1ry area of Pain) (Bilateral) (L>R)   3. 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 Pharmacologic therapy   5. Uncomplicated opioid dependence ([place])      Updated Problems: Problem  Uncomplicated Opioid Dependence ([facility])    Plan of Care  Problem-specific:  No problem-specific Assessment & Plan notes found for this encounter.  [name] has a current medication list which includes the following long-term medication(s): atorvastatin, cetirizine, diphenhydramine, fluoxetine, fluoxetine, fluticasone, furosemide, omeprazole, ropinirole, [START ON 09/12/2020] oxycodone hcl, [START ON 10/12/2020] oxycodone hcl, and [START ON 11/11/2020] oxycodone hcl.  Pharmacotherapy (Medications Ordered):  Meds ordered this encounter  Medications  . Oxycodone HCl 10 MG TABS    Sig: Take 1 tablet (10 mg total) by mouth every 6 (six) hours as needed. Must last 30 days    Dispense:  120 tablet    Refill:  0    Chronic Pain: STOP Act (Not applicable) Fill 1 day early if closed on refill date. Avoid benzodiazepines within 8 hours of opioids  . Oxycodone HCl 10 MG TABS    Sig: Take 1 tablet (10 mg total) by mouth every 6 (six) hours as needed. Must last 30 days    Dispense:  120 tablet    Refill:  0    Chronic Pain: STOP Act (Not applicable) Fill 1 day early if closed on refill date. Avoid benzodiazepines within 8 hours of opioids  . Oxycodone HCl 10 MG TABS    Sig: Take 1 tablet (10 mg total) by mouth every 6 (six)  hours as needed. Must last 30 days    Dispense:  120 tablet    Refill:  0    Chronic Pain: STOP Act (Not applicable) Fill 1 day early if closed on refill date. Avoid benzodiazepines within 8 hours of opioids   Orders:  No orders of the defined types were placed in this encounter.  Follow-up plan:   Return in about 3 months (around 12/11/2020) for (F2F), (Med Mgmt).      Considering:  Diagnostic right hand digit 2 trigger finger injection Therapeutic bilateral IA Hyalgan knee injections  Diagnostic bilateral cervical facet block  Possible bilateral cervical facet RFA  Diagnostic left sided CESI  Diagnostic bilateral IA knee injection Diagnostic bilateral genicular NB Possible bilateral genicular nerve RFA  Diagnostic bilateral sacroiliac joint block Possible bilateral sacroiliac joint RFA  Diagnostic bilateral IA shoulder jointinjection  Diagnostic bilateral suprascapular NB Possible bilateral suprascapular nerve RFA  Diagnostic bilateral lumbar facet block Possible bilateral lumbar facet RFA  Diagnostic left L4-5 LESI    Palliative PRN treatment(s):  Bilateral intra-articular Hyalgan knee injectionseries (injection #4)  Diagnostic bilateral cervical facetblock  Diagnostic left sided CESI  Diagnostic bilateral IA knee injection Diagnostic bilateral genicular NB Diagnostic bilateral sacroiliac joint block Diagnostic bilateral IA shoulder joint injection  Diagnostic bilateral suprascapularNB  Diagnostic bilateral lumbar facetblock  Diagnostic left L4-5 LESI        Recent Visits Date Type Provider Dept  06/14/20 Office Visit Milinda Pointer, MD [facility]  Showing recent visits within past 90 days and meeting all other requirements Today's Visits Date Type Provider Dept  09/08/20 Office Visit Milinda Pointer, MD [facility]  Showing today's visits and meeting all other requirements Future Appointments No visits were found meeting  these conditions. Showing future appointments within next 90 days and meeting all other requirements  I discussed the assessment and treatment plan with the patient. The patient was provided an opportunity to ask questions and all were answered. The patient agreed with the plan and demonstrated an understanding of the instructions.  Patient advised to call back or seek an in-person evaluation if the symptoms or condition worsens.  Duration of encounter: 30 minutes.  Note by: Gaspar Cola, MD Date: 09/08/2020; Time: 2:41 PM

## 2020-09-08 NOTE — Progress Notes (Signed)
Nursing Pain Medication Assessment:  Safety precautions to be maintained throughout the outpatient stay will include: orient to surroundings, keep bed in low position, maintain call bell within reach at all times, provide assistance with transfer out of bed and ambulation.  Medication Inspection Compliance: Pill count conducted under aseptic conditions, in front of the patient. Neither the pills nor the bottle was removed from the patient's sight at any time. Once count was completed pills were immediately returned to the patient in their original bottle.  Medication: Oxycodone IR Pill/Patch Count: 28 of 120 pills remain Pill/Patch Appearance: Markings consistent with prescribed medication Bottle Appearance: Standard pharmacy container. Clearly labeled. Filled Date: 01 / 07 / 2022 Last Medication intake:  Today

## 2020-09-08 NOTE — Patient Instructions (Signed)
____________________________________________________________________________________________  Medication Rules  Purpose: To inform patients, and their family members, of our rules and regulations.  Applies to: All patients receiving prescriptions (written or electronic).  Pharmacy of record: Pharmacy where electronic prescriptions will be sent. If written prescriptions are taken to a different pharmacy, please inform the nursing staff. The pharmacy listed in the electronic medical record should be the one where you would like electronic prescriptions to be sent.  Electronic prescriptions: In compliance with the [place] Strengthen Opioid Misuse Prevention (STOP) Act of 2017 (Session Law 2017-74/H243), effective August 07, 2018, all controlled substances must be electronically prescribed. Calling prescriptions to the pharmacy will cease to exist.  Prescription refills: Only during scheduled appointments. Applies to all prescriptions.  NOTE: The following applies primarily to controlled substances (Opioid* Pain Medications).   Type of encounter (visit): For patients receiving controlled substances, face-to-face visits are required. (Not an option or up to the patient.)  Patient's responsibilities: 1. Pain Pills: Bring all pain pills to every appointment (except for procedure appointments). 2. Pill Bottles: Bring pills in original pharmacy bottle. Always bring the newest bottle. Bring bottle, even if empty. 3. Medication refills: You are responsible for knowing and keeping track of what medications you take and those you need refilled. The day before your appointment: write a list of all prescriptions that need to be refilled. The day of the appointment: give the list to the admitting nurse. Prescriptions will be written only during appointments. No prescriptions will be written on procedure days. If you forget a medication: it will not be "Called in", "Faxed", or "electronically sent".  You will need to get another appointment to get these prescribed. No early refills. Do not call asking to have your prescription filled early. 4. Prescription Accuracy: You are responsible for carefully inspecting your prescriptions before leaving our office. Have the discharge nurse carefully go over each prescription with you, before taking them home. Make sure that your name is accurately spelled, that your address is correct. Check the name and dose of your medication to make sure it is accurate. Check the number of pills, and the written instructions to make sure they are clear and accurate. Make sure that you are given enough medication to last until your next medication refill appointment. 5. Taking Medication: Take medication as prescribed. When it comes to controlled substances, taking less pills or less frequently than prescribed is permitted and encouraged. Never take more pills than instructed. Never take medication more frequently than prescribed.  6. Inform other Doctors: Always inform, all of your healthcare providers, of all the medications you take. 7. Pain Medication from other Providers: You are not allowed to accept any additional pain medication from any other Doctor or Healthcare provider. There are two exceptions to this rule. (see below) In the event that you require additional pain medication, you are responsible for notifying us, as stated below. 8. Cough Medicine: Often these contain an opioid, such as codeine or hydrocodone. Never accept or take cough medicine containing these opioids if you are already taking an opioid* medication. The combination may cause respiratory failure and death. 9. Medication Agreement: You are responsible for carefully reading and following our Medication Agreement. This must be signed before receiving any prescriptions from our practice. Safely store a copy of your signed Agreement. Violations to the Agreement will result in no further prescriptions.  (Additional copies of our Medication Agreement are available upon request.) 10. Laws, Rules, & Regulations: All patients are expected to follow all   Federal and State Laws, Statutes, Rules, & Regulations. Ignorance of the Laws does not constitute a valid excuse.  11. Illegal drugs and Controlled Substances: The use of illegal substances (including, but not limited to marijuana and its derivatives) and/or the illegal use of any controlled substances is strictly prohibited. Violation of this rule may result in the immediate and permanent discontinuation of any and all prescriptions being written by our practice. The use of any illegal substances is prohibited. 12. Adopted CDC guidelines & recommendations: Target dosing levels will be at or below 60 MME/day. Use of benzodiazepines** is not recommended.  Exceptions: There are only two exceptions to the rule of not receiving pain medications from other Healthcare Providers. 1. Exception #1 (Emergencies): In the event of an emergency (i.e.: accident requiring emergency care), you are allowed to receive additional pain medication. However, you are responsible for: As soon as you are able, call our office [phone], at any time of the day or night, and leave a message stating your name, the date and nature of the emergency, and the name and dose of the medication prescribed. In the event that your call is answered by a member of our staff, make sure to document and save the date, time, and the name of the person that took your information.  2. Exception #2 (Planned Surgery): In the event that you are scheduled by another doctor or dentist to have any type of surgery or procedure, you are allowed (for a period no longer than 30 days), to receive additional pain medication, for the acute post-op pain. However, in this case, you are responsible for picking up a copy of our "Post-op Pain Management for Surgeons" handout, and giving it to your surgeon or dentist. This  document is available at our office, and does not require an appointment to obtain it. Simply go to our office during business hours (Monday-Thursday from 8:00 AM to 4:00 PM) (Friday 8:00 AM to 12:00 Noon) or if you have a scheduled appointment with us, prior to your surgery, and ask for it by name. In addition, you are responsible for: calling our office [phone], at any time of the day or night, and leaving a message stating your name, name of your surgeon, type of surgery, and date of procedure or surgery. Failure to comply with your responsibilities may result in termination of therapy involving the controlled substances.  *Opioid medications include: morphine, codeine, oxycodone, oxymorphone, hydrocodone, hydromorphone, meperidine, tramadol, tapentadol, buprenorphine, fentanyl, methadone. **Benzodiazepine medications include: diazepam (Valium), alprazolam (Xanax), clonazepam (Klonopine), lorazepam (Ativan), clorazepate (Tranxene), chlordiazepoxide (Librium), estazolam (Prosom), oxazepam (Serax), temazepam (Restoril), triazolam (Halcion) (Last updated: 07/05/2020) ____________________________________________________________________________________________   ____________________________________________________________________________________________  Medication Recommendations and Reminders  Applies to: All patients receiving prescriptions (written and/or electronic).  Medication Rules & Regulations: These rules and regulations exist for your safety and that of others. They are not flexible and neither are we. Dismissing or ignoring them will be considered "non-compliance" with medication therapy, resulting in complete and irreversible termination of such therapy. (See document titled "Medication Rules" for more details.) In all conscience, because of safety reasons, we cannot continue providing a therapy where the patient does not follow instructions.  Pharmacy of record:   Definition:  This is the pharmacy where your electronic prescriptions will be sent.   We do not endorse any particular pharmacy, however, we have experienced problems with [facility] not securing enough medication supply for the community.  We do not restrict you in your choice of pharmacy. However,   once we write for your prescriptions, we will NOT be re-sending more prescriptions to fix restricted supply problems created by your pharmacy, or your insurance.   The pharmacy listed in the electronic medical record should be the one where you want electronic prescriptions to be sent.  If you choose to change pharmacy, simply notify our nursing staff.  Recommendations:  Keep all of your pain medications in a safe place, under lock and key, even if you live alone. We will NOT replace lost, stolen, or damaged medication.  After you fill your prescription, take 1 week's worth of pills and put them away in a safe place. You should keep a separate, properly labeled bottle for this purpose. The remainder should be kept in the original bottle. Use this as your primary supply, until it runs out. Once it's gone, then you know that you have 1 week's worth of medicine, and it is time to come in for a prescription refill. If you do this correctly, it is unlikely that you will ever run out of medicine.  To make sure that the above recommendation works, it is very important that you make sure your medication refill appointments are scheduled at least 1 week before you run out of medicine. To do this in an effective manner, make sure that you do not leave the office without scheduling your next medication management appointment. Always ask the nursing staff to show you in your prescription , when your medication will be running out. Then arrange for the receptionist to get you a return appointment, at least 7 days before you run out of medicine. Do not wait until you have 1 or 2 pills left, to come in. This is very poor planning and  does not take into consideration that we may need to cancel appointments due to bad weather, sickness, or emergencies affecting our staff.  DO NOT ACCEPT A "Partial Fill": If for any reason your pharmacy does not have enough pills/tablets to completely fill or refill your prescription, do not allow for a "partial fill". The law allows the pharmacy to complete that prescription within 72 hours, without requiring a new prescription. If they do not fill the rest of your prescription within those 72 hours, you will need a separate prescription to fill the remaining amount, which we will NOT provide. If the reason for the partial fill is your insurance, you will need to talk to the pharmacist about payment alternatives for the remaining tablets, but again, DO NOT ACCEPT A PARTIAL FILL, unless you can trust your pharmacist to obtain the remainder of the pills within 72 hours.  Prescription refills and/or changes in medication(s):   Prescription refills, and/or changes in dose or medication, will be conducted only during scheduled medication management appointments. (Applies to both, written and electronic prescriptions.)  No refills on procedure days. No medication will be changed or started on procedure days. No changes, adjustments, and/or refills will be conducted on a procedure day. Doing so will interfere with the diagnostic portion of the procedure.  No phone refills. No medications will be "called into the pharmacy".  No Fax refills.  No weekend refills.  No Holliday refills.  No after hours refills.  Remember:  Business hours are:  Monday to Thursday 8:00 AM to 4:00 PM Provider's Schedule: Monda Chastain, MD - Appointments are:  Medication management: Monday and Wednesday 8:00 AM to 4:00 PM Procedure day: Tuesday and Thursday 7:30 AM to 4:00 PM Bilal Lateef, MD - Appointments are:    Medication management: Tuesday and Thursday 8:00 AM to 4:00 PM Procedure day: Monday and Wednesday  7:30 AM to 4:00 PM (Last update: 02/25/2020) ____________________________________________________________________________________________   ____________________________________________________________________________________________  CBD (cannabidiol) WARNING  Applicable to: All individuals currently taking or considering taking CBD (cannabidiol) and, more important, all patients taking opioid analgesic controlled substances (pain medication). (Example: oxycodone; oxymorphone; hydrocodone; hydromorphone; morphine; methadone; tramadol; tapentadol; fentanyl; buprenorphine; butorphanol; dextromethorphan; meperidine; codeine; etc.)  Legal status: CBD remains a Schedule I drug prohibited for any use. CBD is illegal with one exception. In the [place], CBD has a limited Food and Drug Administration (FDA) approval for the treatment of two specific types of epilepsy disorders. Only one CBD product has been approved by the FDA for this purpose: "Epidiolex". FDA is aware that some companies are marketing products containing cannabis and cannabis-derived compounds in ways that violate the Federal Food, Drug and Cosmetic Act (FD&C Act) and that may put the health and safety of consumers at risk. The FDA, a Federal agency, has not enforced the CBD status since 2018.   Legality: Some manufacturers ship CBD products nationally, which is illegal. Often such products are sold online and are therefore available throughout the country. CBD is openly sold in head shops and health food stores in some states where such sales have not been explicitly legalized. Selling unapproved products with unsubstantiated therapeutic claims is not only a violation of the law, but also can put patients at risk, as these products have not been proven to be safe or effective. Federal illegality makes it difficult to conduct research on CBD.  Reference: "FDA Regulation of Cannabis and Cannabis-Derived Products, Including Cannabidiol  (CBD)" - https://www.fda.gov/news-events/public-health-focus/fda-regulation-cannabis-and-cannabis-derived-products-including-cannabidiol-cbd  Warning: CBD is not FDA approved and has not undergo the same manufacturing controls as prescription drugs.  This means that the purity and safety of available CBD may be questionable. Most of the time, despite manufacturer's claims, it is contaminated with THC (delta-9-tetrahydrocannabinol - the chemical in marijuana responsible for the "HIGH").  When this is the case, the THC contaminant will trigger a positive urine drug screen (UDS) test for Marijuana (carboxy-THC). Because a positive UDS for any illicit substance is a violation of our medication agreement, your opioid analgesics (pain medicine) may be permanently discontinued.  MORE ABOUT CBD  General Information: CBD  is a derivative of the Marijuana (cannabis sativa) plant discovered in 1940. It is one of the 113 identified substances found in Marijuana. It accounts for up to 40% of the plant's extract. As of 2018, preliminary clinical studies on CBD included research for the treatment of anxiety, movement disorders, and pain. CBD is available and consumed in multiple forms, including inhalation of smoke or vapor, as an aerosol spray, and by mouth. It may be supplied as an oil containing CBD, capsules, dried cannabis, or as a liquid solution. CBD is thought not to be as psychoactive as THC (delta-9-tetrahydrocannabinol - the chemical in marijuana responsible for the "HIGH"). Studies suggest that CBD may interact with different biological target receptors in the body, including cannabinoid and other neurotransmitter receptors. As of 2018 the mechanism of action for its biological effects has not been determined.  Side-effects  Adverse reactions: Dry mouth, diarrhea, decreased appetite, fatigue, drowsiness, malaise, weakness, sleep disturbances, and others.  Drug interactions: CBC may interact with other  medications such as blood-thinners. (Last update: 03/13/2020) ____________________________________________________________________________________________    

## 2020-09-09 ENCOUNTER — Ambulatory Visit: Payer: Medicare Other | Admitting: Urology

## 2020-09-09 ENCOUNTER — Encounter: Payer: Self-pay | Admitting: Urology

## 2020-09-09 VITALS — BP 161/79 | HR 88 | Ht 67.0 in | Wt 226.0 lb

## 2020-09-09 DIAGNOSIS — N200 Calculus of kidney: Secondary | ICD-10-CM | POA: Diagnosis not present

## 2020-09-09 DIAGNOSIS — N5201 Erectile dysfunction due to arterial insufficiency: Secondary | ICD-10-CM | POA: Diagnosis not present

## 2020-09-09 MED ORDER — SILDENAFIL CITRATE 20 MG PO TABS: 20.0000 mg | ORAL_TABLET | Freq: Three times a day (TID) | ORAL | 0 refills | Status: DC

## 2020-09-09 NOTE — Progress Notes (Signed)
09/09/2020 2:09 PM   Mark [name] 07/24/1956 [id number]  Referring provider: Ria Bush, MD [address] [place],  [place] [address]  Chief Complaint  Patient presents with  . Nephrolithiasis    Urologic history: 1.Nephrolithiasis -Bilateral, nonobstructing renal calculi -Left ureteroscopic stone removal 11/2015 -Hypocitraturia/hyperoxaluria on 24 urine study -Abnormalities were normal on follow-up 24-hour urine  2.Erectile dysfunction -On generic sildenafil  HPI: 65 y.o. male presents for annual follow-up.   Doing well since last years visit  Denies flank, abdominal or pelvic pain  No bothersome LUTS  Sildenafil effective at 60-80 mg  PSA earlier this week ordered by PCP was 0.70   PMH: Past Medical History:  Diagnosis Date  . Anxiety   . Arthritis   . Chronic pain   . Depression   . Heart murmur   . History of kidney stones April 12,2017   removed two kidney stones about 1 cm in size  . History of permanent cardiac pacemaker placement 05/31/2015  . Hydrocele in adult 05/07/2015  . Kidney stones   . Migraine   . MRSA carrier 2019  . Presence of permanent cardiac pacemaker   . Prostatitis   . Sleep apnea   . Sleep apnea    getting new cpap machine    Surgical History: Past Surgical History:  Procedure Laterality Date  . CARDIOVASCULAR STRESS TEST  2014   Dr Ubaldo Glassing  . CARPAL TUNNEL RELEASE Bilateral 2006  . CERVICAL SPINE SURGERY  2008   C5/6  . COLONOSCOPY  07/2007   prostate nodule, hemorrhoid, rpt 5 yrs [name])  . IMPLANTABLE CARDIOVERTER DEFIBRILLATOR (ICD) GENERATOR CHANGE Left 12/13/2017   Procedure: PACER CHANGE OUT;  Surgeon: Isaias Cowman, MD;  Location: [facility] ORS;  Service: Cardiovascular;  Laterality: Left;  . LITHOTRIPSY Bilateral 2012   Elbert Spickler  . LITHOTRIPSY Left 11/2015   x2 Ramata Strothman  . pace maker revision  12/13/2017  . PACEMAKER PLACEMENT   2009   s/p Medtronic Adapta DR Pacemaker (Dr. Ubaldo Glassing)  . ROTATOR CUFF REPAIR Left 2009  . TONSILLECTOMY  1964  . ULNAR NERVE REPAIR  2008   Left    Home Medications:  Allergies as of 09/09/2020      Reactions   Ciprofloxacin Other (See Comments)   PANIC ATTACKS (Fluoroquinolones)   Metformin Other (See Comments)   Bad acid reflux   Buprenorphine Hcl Nausea Only   Morphine And Related Nausea Only   Penicillins Rash   Rash Has patient had a PCN reaction causing immediate rash, facial/tongue/throat swelling, SOB or lightheadedness with hypotension: Unknown Has patient had a PCN reaction causing severe rash involving mucus membranes or skin necrosis: Unknown Has patient had a PCN reaction that required hospitalization: No Has patient had a PCN reaction occurring within the last 10 years: No CHILDHOOD REACTION. If all of the above answers are "NO", then may proceed with Cephalosporin use.      Medication List       Accurate as of September 09, 2020  2:09 PM. If you have any questions, ask your nurse or doctor.        aspirin 81 MG tablet Take 81 mg by mouth daily.   atorvastatin 20 MG tablet Commonly known as: LIPITOR Take 1 tablet (20 mg total) by mouth at bedtime.   CertaVite Senior/Antioxidant Tabs   cetirizine 10 MG tablet Commonly known as: ZYRTEC Take 10 mg by mouth at bedtime.   CRANBERRY PO Take 4,200 mg by mouth at bedtime.  cyclobenzaprine 10 MG tablet Commonly known as: FLEXERIL TAKE 1 TABLET BY MOUTH TWICE DAILY AS NEEDED FOR MUSCLE SPASMS GENERIC EQUIVALENT FOR FLEXERIL   desonide 0.05 % ointment Commonly known as: DESOWEN Apply 1 application topically 2 (two) times daily as needed (for rash on chin due to dryness).   diphenhydrAMINE 50 MG capsule Commonly known as: BENADRYL Take 50 mg by mouth at bedtime.   docusate sodium 100 MG capsule Commonly known as: COLACE Take 100 mg by mouth 2 (two) times daily as needed.   FLUoxetine 20 MG  capsule Commonly known as: PROZAC Take 1 capsule (20 mg total) by mouth daily.   FLUoxetine 40 MG capsule Commonly known as: PROZAC Take 1 capsule (40 mg total) by mouth daily.   fluticasone 50 MCG/ACT nasal spray Commonly known as: FLONASE Place 2 sprays into both nostrils daily.   furosemide 20 MG tablet Commonly known as: LASIX Take 1 tablet (20 mg total) by mouth daily.   Garlic 0630 MG Caps Take 1,000 mg by mouth daily.   Melatonin 10 MG Tabs Take 10 mg by mouth at bedtime as needed.   MULTIVITAMIN PO Take 1 tablet by mouth daily.   naproxen 500 MG tablet Commonly known as: NAPROSYN Take 1 tablet (500 mg total) by mouth 2 (two) times daily as needed for moderate pain.   Narcan 4 MG/0.1ML Liqd nasal spray kit Generic drug: naloxone Spray into one nostril. Repeat with second device into other nostril after 2-3 minutes if no or minimal response.   Omega-3 1000 MG Caps Take 1,000 mg by mouth at bedtime.   omeprazole 20 MG capsule Commonly known as: PRILOSEC Take 1 capsule (20 mg total) by mouth daily as needed (GERD).   Oxycodone HCl 10 MG Tabs Take 1 tablet (10 mg total) by mouth every 6 (six) hours as needed. Must last 30 days Start taking on: September 12, 2020   Oxycodone HCl 10 MG Tabs Take 1 tablet (10 mg total) by mouth every 6 (six) hours as needed. Must last 30 days Start taking on: October 12, 2020   Oxycodone HCl 10 MG Tabs Take 1 tablet (10 mg total) by mouth every 6 (six) hours as needed. Must last 30 days Start taking on: November 11, 2020   potassium citrate 10 MEQ (1080 MG) SR tablet Commonly known as: UROCIT-K Take 1 tablet (10 mEq total) by mouth 2 (two) times daily.   rOPINIRole 1 MG tablet Commonly known as: REQUIP Take 1 tablet (1 mg total) by mouth at bedtime.   SAW PALMETTO PO Take 450 mg by mouth 2 (two) times daily.   sildenafil 20 MG tablet Commonly known as: REVATIO Take 3-5 tablets (60-100 mg total) by mouth daily as needed (for  erectile dysfunction.).   Vitamin B Complex-C Caps Take by mouth daily.   vitamin C 1000 MG tablet Take 1,000 mg by mouth daily.   Vitamin D 50 MCG (2000 UT) Caps Take 2,000 Units by mouth daily.   vitamin E 1000 UNIT capsule Take 1,000 Units by mouth daily.       Allergies:  Allergies  Allergen Reactions  . Ciprofloxacin Other (See Comments)    PANIC ATTACKS (Fluoroquinolones)   . Metformin Other (See Comments)    Bad acid reflux  . Buprenorphine Hcl Nausea Only  . Morphine And Related Nausea Only  . Penicillins Rash    Rash Has patient had a PCN reaction causing immediate rash, facial/tongue/throat swelling, SOB or lightheadedness with hypotension: Unknown  Has patient had a PCN reaction causing severe rash involving mucus membranes or skin necrosis: Unknown Has patient had a PCN reaction that required hospitalization: No Has patient had a PCN reaction occurring within the last 10 years: No CHILDHOOD REACTION. If all of the above answers are "NO", then may proceed with Cephalosporin use.     Family History: Family History  Problem Relation Age of Onset  . Heart disease Mother   . Diabetes Mother   . Arthritis Mother   . Cancer Mother        Breast Cancer  . Heart block Father   . Heart disease Father   . Arthritis Father   . Cancer Father        skin cancer    Social History:  reports that he has never smoked. He has never used smokeless tobacco. He reports that he does not drink alcohol and does not use drugs.   Physical Exam: BP (!) 161/79   Pulse 88   Ht _0  (1.702 m)   Wt 226 lb (102.5 kg)   BMI 35.40 kg/m   Constitutional:  Alert and oriented, No acute distress. HEENT: [place] AT, moist mucus membranes.  Trachea midline, no masses. Cardiovascular: No clubbing, cyanosis, or edema. Respiratory: Normal respiratory effort, no increased work of breathing. Skin: No rashes, bruises or suspicious lesions. Neurologic: Grossly intact, no focal deficits,  moving all 4 extremities. Psychiatric: Normal mood and affect.   Pertinent Imaging: KUB performed earlier this week was reviewed and stable right lower pole calculi with slight increase left lower pole  Abdomen 1 view (KUB)  Narrative CLINICAL DATA:  Kidney stone.  EXAM: ABDOMEN - 1 VIEW  COMPARISON:  10/20/2019  FINDINGS: Cluster of calculi over the lower right kidney with the largest stone measuring 12 mm. There are additional more peripheral punctate calculi which are marked on the image.  Cluster of calculi over the lower left kidney which in total measures 2.1 cm.  The renal calculi appear more confluent than before, especially at the left lower pole.  No evident ureteral calculi.  Pelvic phleboliths are stable.  IMPRESSION: Bilateral nephrolithiasis with probable progression since March 2021.   Electronically Signed By: Monte Fantasia M.D. On: 09/09/2020 08:12   Assessment & Plan:    1.  Bilateral lower pole nephrolithiasis  Asymptomatic  Desires to continue surveillance  Follow-up 1 year with KUB  2.  Erectile dysfunction  Sildenafil refilled    Abbie Sons, MD  [facility] [address] [place], [place] [address] [phone]

## 2020-09-14 ENCOUNTER — Encounter: Payer: Self-pay | Admitting: Family Medicine

## 2020-09-14 ENCOUNTER — Ambulatory Visit (INDEPENDENT_AMBULATORY_CARE_PROVIDER_SITE_OTHER): Payer: Medicare Other | Admitting: Family Medicine

## 2020-09-14 ENCOUNTER — Other Ambulatory Visit: Payer: Self-pay

## 2020-09-14 VITALS — BP 134/66 | HR 79 | Temp 97.7°F | Ht 67.0 in | Wt 229.0 lb

## 2020-09-14 DIAGNOSIS — Z7189 Other specified counseling: Secondary | ICD-10-CM | POA: Diagnosis not present

## 2020-09-14 DIAGNOSIS — R195 Other fecal abnormalities: Secondary | ICD-10-CM

## 2020-09-14 DIAGNOSIS — F331 Major depressive disorder, recurrent, moderate: Secondary | ICD-10-CM

## 2020-09-14 DIAGNOSIS — K219 Gastro-esophageal reflux disease without esophagitis: Secondary | ICD-10-CM

## 2020-09-14 DIAGNOSIS — Z1211 Encounter for screening for malignant neoplasm of colon: Secondary | ICD-10-CM

## 2020-09-14 DIAGNOSIS — G894 Chronic pain syndrome: Secondary | ICD-10-CM | POA: Diagnosis not present

## 2020-09-14 DIAGNOSIS — E785 Hyperlipidemia, unspecified: Secondary | ICD-10-CM

## 2020-09-14 DIAGNOSIS — D509 Iron deficiency anemia, unspecified: Secondary | ICD-10-CM

## 2020-09-14 DIAGNOSIS — Z0000 Encounter for general adult medical examination without abnormal findings: Secondary | ICD-10-CM

## 2020-09-14 DIAGNOSIS — G2581 Restless legs syndrome: Secondary | ICD-10-CM

## 2020-09-14 MED ORDER — ATORVASTATIN CALCIUM 20 MG PO TABS: 20.0000 mg | ORAL_TABLET | Freq: Every day | ORAL | 4 refills | Status: DC

## 2020-09-14 MED ORDER — FLUOXETINE HCL 20 MG PO CAPS: 20.0000 mg | ORAL_CAPSULE | Freq: Every day | ORAL | 4 refills | Status: DC

## 2020-09-14 MED ORDER — FLUOXETINE HCL 40 MG PO CAPS: 40.0000 mg | ORAL_CAPSULE | Freq: Every day | ORAL | 4 refills | Status: DC

## 2020-09-14 MED ORDER — OMEPRAZOLE 20 MG PO CPDR: 20.0000 mg | DELAYED_RELEASE_CAPSULE | Freq: Every day | ORAL | 4 refills | Status: DC | PRN

## 2020-09-14 MED ORDER — ROPINIROLE HCL 1 MG PO TABS: 1.0000 mg | ORAL_TABLET | Freq: Every day | ORAL | 4 refills | Status: DC

## 2020-09-14 MED ORDER — FLUTICASONE PROPIONATE 50 MCG/ACT NA SUSP
2.0000 | Freq: Every day | NASAL | 4 refills | Status: DC
Start: 2020-09-14 — End: 2021-10-27

## 2020-09-14 MED ORDER — FUROSEMIDE 20 MG PO TABS: 20.0000 mg | ORAL_TABLET | Freq: Every day | ORAL | 4 refills | Status: DC

## 2020-09-14 NOTE — Assessment & Plan Note (Signed)
Preventative protocols reviewed and updated unless pt declined. Discussed healthy diet and lifestyle.  

## 2020-09-14 NOTE — Assessment & Plan Note (Signed)
Continue omeprazole 20mg  daily - especially with daily NSAID + SSRI use.

## 2020-09-14 NOTE — Assessment & Plan Note (Signed)
Chronic, stable on atorvastatin - continue. The ASCVD Risk score (Goff DC Jr., et al., 2013) failed to calculate for the following reasons:   The valid total cholesterol range is 130 to 320 mg/dL  

## 2020-09-14 NOTE — Assessment & Plan Note (Signed)
Continue f/u with pain management.

## 2020-09-14 NOTE — Assessment & Plan Note (Signed)
Encouraged ongoing efforts towards sustainable weight loss.  

## 2020-09-14 NOTE — Patient Instructions (Addendum)
Consider shingles vaccine. If interested, check with pharmacy about new 2 shot shingles series (shingrix).  Bring [place] copy of you living will.  You are doing well today Return as needed or in 1 year for next physical.   Health Maintenance, Male Adopting a healthy lifestyle and getting preventive care are important in promoting health and wellness. Ask your health care provider about:  The right schedule for you to have regular tests and exams.  Things you can do on your own to prevent diseases and keep yourself healthy. What should I know about diet, weight, and exercise? Eat a healthy diet  Eat a diet that includes plenty of vegetables, fruits, low-fat dairy products, and lean protein.  Do not eat a lot of foods that are high in solid fats, added sugars, or sodium.   Maintain a healthy weight Body mass index (BMI) is a measurement that can be used to identify possible weight problems. It estimates body fat based on height and weight. Your health care provider can help determine your BMI and help you achieve or maintain a healthy weight. Get regular exercise Get regular exercise. This is one of the most important things you can do for your health. Most adults should:  Exercise for at least 150 minutes each week. The exercise should increase your heart rate and make you sweat (moderate-intensity exercise).  Do strengthening exercises at least twice a week. This is in addition to the moderate-intensity exercise.  Spend less time sitting. Even light physical activity can be beneficial. Watch cholesterol and blood lipids Have your blood tested for lipids and cholesterol at 64 years of age, then have this test every 5 years. You may need to have your cholesterol levels checked more often if:  Your lipid or cholesterol levels are high.  You are older than 64 years of age.  You are at high risk for heart disease. What should I know about cancer screening? Many types of cancers can be  detected early and may often be prevented. Depending on your health history and family history, you may need to have cancer screening at various ages. This may include screening for:  Colorectal cancer.  Prostate cancer.  Skin cancer.  Lung cancer. What should I know about heart disease, diabetes, and high blood pressure? Blood pressure and heart disease  High blood pressure causes heart disease and increases the risk of stroke. This is more likely to develop in people who have high blood pressure readings, are of African descent, or are overweight.  Talk with your health care provider about your target blood pressure readings.  Have your blood pressure checked: ? Every 3-5 years if you are 19-74 years of age. ? Every year if you are 66 years old or older.  If you are between the ages of 28 and 66 and are a current or former smoker, ask your health care provider if you should have a one-time screening for abdominal aortic aneurysm (AAA). Diabetes Have regular diabetes screenings. This checks your fasting blood sugar level. Have the screening done:  Once every three years after age 45 if you are at a normal weight and have a low risk for diabetes.  More often and at a younger age if you are overweight or have a high risk for diabetes. What should I know about preventing infection? Hepatitis B If you have a higher risk for hepatitis B, you should be screened for this virus. Talk with your health care provider to find out if  you are at risk for hepatitis B infection. Hepatitis C Blood testing is recommended for:  Everyone born from [birth year] through 1964.  Anyone with known risk factors for hepatitis C. Sexually transmitted infections (STIs)  You should be screened each year for STIs, including gonorrhea and chlamydia, if: ? You are sexually active and are younger than 64 years of age. ? You are older than 64 years of age and your health care provider tells you that you are at risk  for this type of infection. ? Your sexual activity has changed since you were last screened, and you are at increased risk for chlamydia or gonorrhea. Ask your health care provider if you are at risk.  Ask your health care provider about whether you are at high risk for HIV. Your health care provider may recommend a prescription medicine to help prevent HIV infection. If you choose to take medicine to prevent HIV, you should first get tested for HIV. You should then be tested every 3 months for as long as you are taking the medicine. Follow these instructions at home: Lifestyle  Do not use any products that contain nicotine or tobacco, such as cigarettes, e-cigarettes, and chewing tobacco. If you need help quitting, ask your health care provider.  Do not use street drugs.  Do not share needles.  Ask your health care provider for help if you need support or information about quitting drugs. Alcohol use  Do not drink alcohol if your health care provider tells you not to drink.  If you drink alcohol: ? Limit how much you have to 0-2 drinks a day. ? Be aware of how much alcohol is in your drink. In the [place] one drink equals one 12 oz bottle of beer (355 mL), one 5 oz glass of wine (148 mL), or one 1 oz glass of hard liquor (44 mL). General instructions  Schedule regular health, dental, and eye exams.  Stay current with your vaccines.  Tell your health care provider if: ? You often feel depressed. ? You have ever been abused or do not feel safe at home. Summary  Adopting a healthy lifestyle and getting preventive care are important in promoting health and wellness.  Follow your health care provider's instructions about healthy diet, exercising, and getting tested or screened for diseases.  Follow your health care provider's instructions on monitoring your cholesterol and blood pressure. This information is not intended to replace advice given to you by your health care provider.  Make sure you discuss any questions you have with your health care provider. Document Revised: 07/17/2018 Document Reviewed: 07/17/2018 Elsevier Patient Education  2021 [employer].

## 2020-09-14 NOTE — Assessment & Plan Note (Addendum)
Reviewed with patient positive Cologuard 08/2017.  Reviewed reasons to proceed with colonoscopy.  Will refer back to [facility] as overdue for f/u.

## 2020-09-14 NOTE — Assessment & Plan Note (Signed)
Iron levels and stores stable, transferrin low.

## 2020-09-14 NOTE — Assessment & Plan Note (Signed)
Advanced directive discussion -has this at home -HCPOA would be Bethann Berkshire (lifelong friend).Asked to bring [place] a copy.

## 2020-09-14 NOTE — Assessment & Plan Note (Signed)
Chronic, stable period on prozac 60mg  daily. Reviewed increased GI bleed risk of NSAID + SSRI

## 2020-09-14 NOTE — Progress Notes (Signed)
Patient ID: Mark [name], male    DOB: Feb 06, 1956, 65 y.o.   MRN: [id number]  This visit was conducted in person.  BP 134/66 (BP Location: Left Arm, Patient Position: Sitting, Cuff Size: Large)   Pulse 79   Temp 97.7 F (36.5 C) (Temporal)   Ht 5' 7"  (1.702 m)   Wt 229 lb (103.9 kg)   SpO2 97%   BMI 35.87 kg/m    CC: CPE Subjective:   HPI: Mark [name] is a 65 y.o. male presenting on 09/14/2020 for Annual Exam (Prt 2. )   Mother had stroke 06/2020 - she is out of rehab and has been staying with him, he's trying to get her to move in next door.   No exam data present  [place] Visit from 09/08/2020 in [place]  PHQ-2 Total Score 0      Fall Risk  09/08/2020 08/27/2020 06/14/2020 03/11/2020 03/03/2020  Falls in the past year? 0 0 0 0 0  Number falls in past yr: - 0 - - 0  Injury with Fall? - 0 - - 0  Risk for fall due to : - Medication side effect - - -  Follow up - Falls evaluation completed;Falls prevention discussed - - Falls evaluation completed    Chronic pain - manages with opiate through pain clinic as well as naprosyn 543m daily with breakfast. Discussed bleeding risk with NSAID + SSRI. He continues prilosec 261mdaily.   Preventative: COLONOSCOPY 07/2007 prostate nodule, hemorrhoid, rpt 5 yrs (EVira Agar Cologuardpositive 08/2017 - colonoscopy postponed for the past 2 years. Overdue. Endorses h/o hemorrhoids.  Prostate cancer screeningwith nodule-followed by urology Dr StBernardo Heater Lung cancer screening -not indicated Flu shotyearly  COVID vaccine Moderna 11/2019 x2, booster 07/2020 Pneumovax 2007  Tdap 11/2009, 04/2020 Shingrix - discussed, will consider  Advanced directive discussion -has this at home -HCPOA would be SuCarlos Leveringlifelong friend).Asked to bring [place] copy. Seat belt use discussed  Sunscreen use discussed, no changing moles on skin. Nonsmoker  Alcohol - none Dentist q6 mo  Eye exam  q2-3 yrs Bowel - no constipation Bladder - no incontinence  Divorced. Lives with wife. Edu: 8th grade Occ: retired, disability for chronic neck pain  Activity: no regular exercise - limited by back and knee pain Diet: increasing water, poorvegetablesandfruits     Relevant past medical, surgical, family and social history reviewed and updated as indicated. Interim medical history since our last visit reviewed. Allergies and medications reviewed and updated. Outpatient Medications Prior to Visit  Medication Sig Dispense Refill  . Ascorbic Acid (VITAMIN C) 1000 MG tablet Take 1,000 mg by mouth daily.    . Marland Kitchenspirin 81 MG tablet Take 81 mg by mouth daily.    . cetirizine (ZYRTEC) 10 MG tablet Take 10 mg by mouth at bedtime.     . Cholecalciferol (VITAMIN D) 2000 units CAPS Take 2,000 Units by mouth daily.     . Marland KitchenRANBERRY PO Take 4,200 mg by mouth at bedtime.     . cyclobenzaprine (FLEXERIL) 10 MG tablet TAKE 1 TABLET BY MOUTH TWICE DAILY AS NEEDED FOR MUSCLE SPASMS GENERIC EQUIVALENT FOR FLEXERIL 40 tablet 0  . desonide (DESOWEN) 0.05 % ointment Apply 1 application topically 2 (two) times daily as needed (for rash on chin due to dryness). 15 g 0  . diphenhydrAMINE (BENADRYL) 50 MG capsule Take 50 mg by mouth at bedtime.    . docusate sodium (COLACE)  100 MG capsule Take 100 mg by mouth 2 (two) times daily as needed.     . Garlic 7867 MG CAPS Take 1,000 mg by mouth daily.    . Melatonin 10 MG TABS Take 10 mg by mouth at bedtime as needed.    . Multiple Vitamin (MULTIVITAMIN PO) Take 1 tablet by mouth daily.    . Multiple Vitamins-Minerals (CERTAVITE SENIOR/ANTIOXIDANT) TABS     . naloxone (NARCAN) 4 MG/0.1ML LIQD nasal spray kit Spray into one nostril. Repeat with second device into other nostril after 2-3 minutes if no or minimal response. 2 each 0  . naproxen (NAPROSYN) 500 MG tablet Take 1 tablet (500 mg total) by mouth 2 (two) times daily as needed for moderate pain. 90 tablet 0  .  Omega-3 1000 MG CAPS Take 1,000 mg by mouth at bedtime.    . Oxycodone HCl 10 MG TABS Take 1 tablet (10 mg total) by mouth every 6 (six) hours as needed. Must last 30 days 120 tablet 0  . [START ON 10/12/2020] Oxycodone HCl 10 MG TABS Take 1 tablet (10 mg total) by mouth every 6 (six) hours as needed. Must last 30 days 120 tablet 0  . [START ON 11/11/2020] Oxycodone HCl 10 MG TABS Take 1 tablet (10 mg total) by mouth every 6 (six) hours as needed. Must last 30 days 120 tablet 0  . potassium citrate (UROCIT-K) 10 MEQ (1080 MG) SR tablet Take 1 tablet (10 mEq total) by mouth 2 (two) times daily. 180 tablet 3  . Saw Palmetto, Serenoa repens, (SAW PALMETTO PO) Take 450 mg by mouth 2 (two) times daily.     . sildenafil (REVATIO) 20 MG tablet Take 1 tablet (20 mg total) by mouth 3 (three) times daily. 90 tablet 0  . VITAMIN B COMPLEX-C CAPS Take by mouth daily.     . vitamin E 1000 UNIT capsule Take 1,000 Units by mouth daily.     Marland Kitchen atorvastatin (LIPITOR) 20 MG tablet Take 1 tablet (20 mg total) by mouth at bedtime. 90 tablet 4  . FLUoxetine (PROZAC) 20 MG capsule Take 1 capsule (20 mg total) by mouth daily. 90 capsule 4  . FLUoxetine (PROZAC) 40 MG capsule Take 1 capsule (40 mg total) by mouth daily. 90 capsule 4  . fluticasone (FLONASE) 50 MCG/ACT nasal spray Place 2 sprays into both nostrils daily. 48 g 4  . furosemide (LASIX) 20 MG tablet Take 1 tablet (20 mg total) by mouth daily. 90 tablet 4  . omeprazole (PRILOSEC) 20 MG capsule Take 1 capsule (20 mg total) by mouth daily as needed (GERD). 90 capsule 4  . rOPINIRole (REQUIP) 1 MG tablet Take 1 tablet (1 mg total) by mouth at bedtime. 90 tablet 4   No facility-administered medications prior to visit.     Per HPI unless specifically indicated in ROS section below Review of Systems  Constitutional: Negative for activity change, appetite change, chills, fatigue, fever and unexpected weight change.  HENT: Negative for hearing loss.   Eyes: Negative  for visual disturbance.  Respiratory: Negative for cough, chest tightness, shortness of breath and wheezing.   Cardiovascular: Negative for chest pain, palpitations and leg swelling.  Gastrointestinal: Negative for abdominal distention, abdominal pain, blood in stool, constipation, diarrhea, nausea and vomiting.  Genitourinary: Negative for difficulty urinating and hematuria.  Musculoskeletal: Negative for arthralgias, myalgias and neck pain.  Skin: Negative for rash.  Neurological: Negative for dizziness, seizures, syncope and headaches.  Hematological: Negative for adenopathy.  Does not bruise/bleed easily.  Psychiatric/Behavioral: Negative for dysphoric mood. The patient is not nervous/anxious.    Objective:  BP 134/66 (BP Location: Left Arm, Patient Position: Sitting, Cuff Size: Large)   Pulse 79   Temp 97.7 F (36.5 C) (Temporal)   Ht $R'5\' 7"'cj$  (1.702 m)   Wt 229 lb (103.9 kg)   SpO2 97%   BMI 35.87 kg/m   Wt Readings from Last 3 Encounters:  09/14/20 229 lb (103.9 kg)  09/09/20 226 lb (102.5 kg)  09/08/20 226 lb (102.5 kg)      Physical Exam Vitals and nursing note reviewed.  Constitutional:      General: He is not in acute distress.    Appearance: Normal appearance. He is well-developed and well-nourished. He is not ill-appearing.  HENT:     Head: Normocephalic and atraumatic.     Right Ear: Hearing, tympanic membrane, ear canal and external ear normal.     Left Ear: Hearing, tympanic membrane, ear canal and external ear normal.     Mouth/Throat:     Mouth: Oropharynx is clear and moist and mucous membranes are normal.     Pharynx: Uvula midline. No posterior oropharyngeal edema.  Eyes:     General: No scleral icterus.    Extraocular Movements: Extraocular movements intact and EOM normal.     Conjunctiva/sclera: Conjunctivae normal.     Pupils: Pupils are equal, round, and reactive to light.  Cardiovascular:     Rate and Rhythm: Normal rate and regular rhythm.      Pulses: Normal pulses and intact distal pulses.          Radial pulses are 2+ on the right side and 2+ on the left side.     Heart sounds: Normal heart sounds. No murmur heard.   Pulmonary:     Effort: Pulmonary effort is normal. No respiratory distress.     Breath sounds: Normal breath sounds. No wheezing, rhonchi or rales.  Abdominal:     General: Abdomen is flat. Bowel sounds are normal. There is no distension.     Palpations: Abdomen is soft. There is no mass.     Tenderness: There is no abdominal tenderness. There is no guarding or rebound.     Hernia: No hernia is present.  Musculoskeletal:        General: No edema. Normal range of motion.     Cervical back: Normal range of motion and neck supple.     Right lower leg: No edema.     Left lower leg: No edema.  Lymphadenopathy:     Cervical: No cervical adenopathy.  Skin:    General: Skin is warm and dry.     Findings: No rash.  Neurological:     General: No focal deficit present.     Mental Status: He is alert and oriented to person, place, and time.     Comments: CN grossly intact, station and gait intact  Psychiatric:        Mood and Affect: Mood and affect and mood normal.        Behavior: Behavior normal.        Thought Content: Thought content normal.        Judgment: Judgment normal.       Results for orders placed or performed in visit on 09/07/20  IBC panel  Result Value Ref Range   Iron 97 42 - 165 ug/dL   Transferrin 205.0 (L) 212.0 - 360.0 mg/dL   Saturation Ratios  33.8 20.0 - 50.0 %  Ferritin  Result Value Ref Range   Ferritin 202.5 22.0 - 322.0 ng/mL  CBC with Differential/Platelet  Result Value Ref Range   WBC 6.8 4.0 - 10.5 K/uL   RBC 4.98 4.22 - 5.81 Mil/uL   Hemoglobin 15.4 13.0 - 17.0 g/dL   HCT 43.6 39.0 - 52.0 %   MCV 87.6 78.0 - 100.0 fl   MCHC 35.3 30.0 - 36.0 g/dL   RDW 13.1 11.5 - 15.5 %   Platelets 162.0 150.0 - 400.0 K/uL   Neutrophils Relative % 58.1 43.0 - 77.0 %   Lymphocytes  Relative 25.8 12.0 - 46.0 %   Monocytes Relative 11.8 3.0 - 12.0 %   Eosinophils Relative 3.1 0.0 - 5.0 %   Basophils Relative 1.2 0.0 - 3.0 %   Neutro Abs 3.9 1.4 - 7.7 K/uL   Lymphs Abs 1.8 0.7 - 4.0 K/uL   Monocytes Absolute 0.8 0.1 - 1.0 K/uL   Eosinophils Absolute 0.2 0.0 - 0.7 K/uL   Basophils Absolute 0.1 0.0 - 0.1 K/uL  PSA  Result Value Ref Range   PSA 0.78 0.10 - 4.00 ng/mL  Hemoglobin A1c  Result Value Ref Range   Hgb A1c MFr Bld 5.6 4.6 - 6.5 %  Lipid panel  Result Value Ref Range   Cholesterol 109 0 - 200 mg/dL   Triglycerides 118.0 0.0 - 149.0 mg/dL   HDL 40.00 >39.00 mg/dL   VLDL 23.6 0.0 - 40.0 mg/dL   LDL Cholesterol 46 0 - 99 mg/dL   Total CHOL/HDL Ratio 3    NonHDL 69.40   Comprehensive metabolic panel  Result Value Ref Range   Sodium 137 135 - 145 mEq/L   Potassium 4.2 3.5 - 5.1 mEq/L   Chloride 104 96 - 112 mEq/L   CO2 29 19 - 32 mEq/L   Glucose, Bld 120 (H) 70 - 99 mg/dL   BUN 20 6 - 23 mg/dL   Creatinine, Ser 0.88 0.40 - 1.50 mg/dL   Total Bilirubin 0.8 0.2 - 1.2 mg/dL   Alkaline Phosphatase 56 39 - 117 U/L   AST 26 0 - 37 U/L   ALT 29 0 - 53 U/L   Total Protein 6.1 6.0 - 8.3 g/dL   Albumin 4.0 3.5 - 5.2 g/dL   GFR 91.41 >60.00 mL/min   Calcium 9.4 8.4 - 10.5 mg/dL   Depression screen [facility] 2/9 09/08/2020 08/27/2020 03/11/2020 03/03/2020 08/20/2019  Decreased Interest 0 0 0 0 0  Down, Depressed, Hopeless 0 0 0 0 0  PHQ - 2 Score 0 0 0 0 0  Altered sleeping - 0 - - 0  Tired, decreased energy - 0 - - 0  Change in appetite - 0 - - 0  Feeling bad or failure about yourself  - 0 - - 0  Trouble concentrating - 0 - - 0  Moving slowly or fidgety/restless - 0 - - 0  Suicidal thoughts - 0 - - 0  PHQ-9 Score - 0 - - 0  Difficult doing work/chores - Not difficult at all - - Not difficult at all  Some recent data might be hidden    Assessment & Plan:  This visit occurred during the SARS-CoV-2 public health emergency.  Safety protocols were in place, including  screening questions prior to the visit, additional usage of staff PPE, and extensive cleaning of exam room while observing appropriate contact time as indicated for disinfecting solutions.   Problem List Items  Addressed This Visit    Severe obesity (BMI 35.0-39.9) with comorbidity ([place])    Encouraged ongoing efforts towards sustainable weight loss.       RLS (restless legs syndrome)    Doing well on requip 92m daily - continue.       Positive colorectal cancer screening using Cologuard test    Reviewed with patient positive Cologuard 08/2017.  Reviewed reasons to proceed with colonoscopy.  [facility]       MDD (major depressive disorder), recurrent episode, moderate (HCC)    Chronic, stable period on prozac 616mdaily. Reviewed increased GI bleed risk of NSAID + SSRI      Relevant Medications   FLUoxetine (PROZAC) 20 MG capsule   FLUoxetine (PROZAC) 40 MG capsule   IDA (iron deficiency anemia)    Iron levels and stores stable, transferrin low.       Hyperlipidemia    Chronic, stable on atorvastatin - continue. The ASCVD Risk score (GMikey BussingC Jr., et al., 2013) failed to calculate for the following reasons:   The valid total cholesterol range is 130 to 320 mg/dL       Relevant Medications   atorvastatin (LIPITOR) 20 MG tablet   furosemide (LASIX) 20 MG tablet   Health maintenance examination - Primary    Preventative protocols reviewed and updated unless pt declined. Discussed healthy diet and lifestyle.       GERD (gastroesophageal reflux disease)    Continue omeprazole 2069maily - especially with daily NSAID + SSRI use.       Relevant Medications   omeprazole (PRILOSEC) 20 MG capsule   Chronic pain syndrome (Chronic)    Continue f/u with pain management.       Relevant Medications   FLUoxetine (PROZAC) 20 MG capsule   FLUoxetine (PROZAC) 40 MG capsule   Advanced care planning/counseling discussion    Advanced directive  discussion -has this at home -HCPOA would be Mark Leveringifelong friend).Asked to bring us [place].       Other Visit Diagnoses    Special screening for malignant neoplasms, colon       Relevant Orders   Ambulatory referral to Gastroenterology       Meds ordered this encounter  Medications  . omeprazole (PRILOSEC) 20 MG capsule    Sig: Take 1 capsule (20 mg total) by mouth daily as needed (GERD).    Dispense:  90 capsule    Refill:  4  . atorvastatin (LIPITOR) 20 MG tablet    Sig: Take 1 tablet (20 mg total) by mouth at bedtime.    Dispense:  90 tablet    Refill:  4  . FLUoxetine (PROZAC) 20 MG capsule    Sig: Take 1 capsule (20 mg total) by mouth daily.    Dispense:  90 capsule    Refill:  4  . FLUoxetine (PROZAC) 40 MG capsule    Sig: Take 1 capsule (40 mg total) by mouth daily.    Dispense:  90 capsule    Refill:  4  . fluticasone (FLONASE) 50 MCG/ACT nasal spray    Sig: Place 2 sprays into both nostrils daily.    Dispense:  48 g    Refill:  4  . furosemide (LASIX) 20 MG tablet    Sig: Take 1 tablet (20 mg total) by mouth daily.    Dispense:  90 tablet    Refill:  4  . rOPINIRole (REQUIP) 1 MG  tablet    Sig: Take 1 tablet (1 mg total) by mouth at bedtime.    Dispense:  90 tablet    Refill:  4   Orders Placed This Encounter  Procedures  . Ambulatory referral to Gastroenterology    Referral Priority:   Routine    Referral Type:   Consultation    Referral Reason:   Specialty Services Required    Number of Visits Requested:   1    Patient instructions: Consider shingles vaccine. If interested, check with pharmacy about new 2 shot shingles series (shingrix).   Bring [place] copy of you living will.  You are doing well today Return as needed or in 1 year for next physical.   Follow up plan: Return in about 1 year (around 09/14/2021) for annual exam, prior fasting for blood work.  Ria Bush, MD

## 2020-09-14 NOTE — Assessment & Plan Note (Signed)
Doing well on requip 1mg  daily - continue.

## 2020-10-05 HISTORY — PX: LAPAROSCOPIC CHOLECYSTECTOMY: SUR755

## 2020-10-21 DIAGNOSIS — R0789 Other chest pain: Secondary | ICD-10-CM | POA: Diagnosis not present

## 2020-10-21 DIAGNOSIS — R1013 Epigastric pain: Secondary | ICD-10-CM | POA: Diagnosis not present

## 2020-10-21 DIAGNOSIS — I700 Atherosclerosis of aorta: Secondary | ICD-10-CM | POA: Diagnosis not present

## 2020-10-21 DIAGNOSIS — R112 Nausea with vomiting, unspecified: Secondary | ICD-10-CM | POA: Diagnosis not present

## 2020-10-21 DIAGNOSIS — N261 Atrophy of kidney (terminal): Secondary | ICD-10-CM | POA: Diagnosis not present

## 2020-10-21 DIAGNOSIS — N281 Cyst of kidney, acquired: Secondary | ICD-10-CM | POA: Diagnosis not present

## 2020-10-21 DIAGNOSIS — R079 Chest pain, unspecified: Secondary | ICD-10-CM | POA: Diagnosis not present

## 2020-10-21 DIAGNOSIS — B349 Viral infection, unspecified: Secondary | ICD-10-CM | POA: Diagnosis not present

## 2020-10-21 DIAGNOSIS — I10 Essential (primary) hypertension: Secondary | ICD-10-CM | POA: Diagnosis not present

## 2020-10-21 DIAGNOSIS — N200 Calculus of kidney: Secondary | ICD-10-CM | POA: Diagnosis not present

## 2020-10-21 DIAGNOSIS — Z20822 Contact with and (suspected) exposure to covid-19: Secondary | ICD-10-CM | POA: Diagnosis not present

## 2020-10-22 DIAGNOSIS — I5032 Chronic diastolic (congestive) heart failure: Secondary | ICD-10-CM | POA: Insufficient documentation

## 2020-10-22 DIAGNOSIS — R079 Chest pain, unspecified: Secondary | ICD-10-CM | POA: Diagnosis not present

## 2020-10-22 DIAGNOSIS — E669 Obesity, unspecified: Secondary | ICD-10-CM | POA: Diagnosis not present

## 2020-10-22 DIAGNOSIS — F32A Depression, unspecified: Secondary | ICD-10-CM | POA: Diagnosis not present

## 2020-10-22 DIAGNOSIS — B349 Viral infection, unspecified: Secondary | ICD-10-CM | POA: Diagnosis not present

## 2020-10-22 DIAGNOSIS — R1013 Epigastric pain: Secondary | ICD-10-CM | POA: Diagnosis not present

## 2020-10-22 DIAGNOSIS — E785 Hyperlipidemia, unspecified: Secondary | ICD-10-CM | POA: Diagnosis not present

## 2020-10-22 DIAGNOSIS — K810 Acute cholecystitis: Secondary | ICD-10-CM | POA: Diagnosis not present

## 2020-10-22 DIAGNOSIS — E1169 Type 2 diabetes mellitus with other specified complication: Secondary | ICD-10-CM | POA: Insufficient documentation

## 2020-10-22 DIAGNOSIS — R7401 Elevation of levels of liver transaminase levels: Secondary | ICD-10-CM | POA: Insufficient documentation

## 2020-10-22 DIAGNOSIS — Z885 Allergy status to narcotic agent status: Secondary | ICD-10-CM | POA: Diagnosis not present

## 2020-10-22 DIAGNOSIS — R112 Nausea with vomiting, unspecified: Secondary | ICD-10-CM | POA: Diagnosis not present

## 2020-10-22 DIAGNOSIS — N281 Cyst of kidney, acquired: Secondary | ICD-10-CM | POA: Diagnosis not present

## 2020-10-22 DIAGNOSIS — N200 Calculus of kidney: Secondary | ICD-10-CM | POA: Diagnosis not present

## 2020-10-22 DIAGNOSIS — R748 Abnormal levels of other serum enzymes: Secondary | ICD-10-CM | POA: Insufficient documentation

## 2020-10-22 DIAGNOSIS — K831 Obstruction of bile duct: Secondary | ICD-10-CM | POA: Diagnosis not present

## 2020-10-22 DIAGNOSIS — K851 Biliary acute pancreatitis without necrosis or infection: Secondary | ICD-10-CM | POA: Diagnosis not present

## 2020-10-22 DIAGNOSIS — K219 Gastro-esophageal reflux disease without esophagitis: Secondary | ICD-10-CM | POA: Diagnosis not present

## 2020-10-22 DIAGNOSIS — Z950 Presence of cardiac pacemaker: Secondary | ICD-10-CM | POA: Insufficient documentation

## 2020-10-22 DIAGNOSIS — I700 Atherosclerosis of aorta: Secondary | ICD-10-CM | POA: Diagnosis not present

## 2020-10-22 DIAGNOSIS — Z20822 Contact with and (suspected) exposure to covid-19: Secondary | ICD-10-CM | POA: Diagnosis not present

## 2020-10-22 DIAGNOSIS — R1084 Generalized abdominal pain: Secondary | ICD-10-CM | POA: Diagnosis not present

## 2020-10-22 DIAGNOSIS — Z6836 Body mass index (BMI) 36.0-36.9, adult: Secondary | ICD-10-CM | POA: Diagnosis not present

## 2020-10-22 DIAGNOSIS — N261 Atrophy of kidney (terminal): Secondary | ICD-10-CM | POA: Diagnosis not present

## 2020-10-22 DIAGNOSIS — R109 Unspecified abdominal pain: Secondary | ICD-10-CM | POA: Diagnosis not present

## 2020-10-22 DIAGNOSIS — E119 Type 2 diabetes mellitus without complications: Secondary | ICD-10-CM | POA: Diagnosis not present

## 2020-10-22 DIAGNOSIS — G4733 Obstructive sleep apnea (adult) (pediatric): Secondary | ICD-10-CM | POA: Diagnosis not present

## 2020-10-22 DIAGNOSIS — R7303 Prediabetes: Secondary | ICD-10-CM | POA: Insufficient documentation

## 2020-10-22 DIAGNOSIS — Z880 Allergy status to penicillin: Secondary | ICD-10-CM | POA: Diagnosis not present

## 2020-10-22 DIAGNOSIS — Z7982 Long term (current) use of aspirin: Secondary | ICD-10-CM | POA: Diagnosis not present

## 2020-10-22 DIAGNOSIS — Z79899 Other long term (current) drug therapy: Secondary | ICD-10-CM | POA: Diagnosis not present

## 2020-10-23 ENCOUNTER — Inpatient Hospital Stay (HOSPITAL_COMMUNITY)
Admission: AD | Admit: 2020-10-23 | Discharge: 2020-10-29 | DRG: 417 | Disposition: A | Discharge status: Home / Self Care | Payer: Medicare Other | Source: Other Acute Inpatient Hospital | Attending: Internal Medicine | Admitting: Internal Medicine

## 2020-10-23 DIAGNOSIS — Z880 Allergy status to penicillin: Secondary | ICD-10-CM

## 2020-10-23 DIAGNOSIS — K851 Biliary acute pancreatitis without necrosis or infection: Secondary | ICD-10-CM | POA: Diagnosis present

## 2020-10-23 DIAGNOSIS — E785 Hyperlipidemia, unspecified: Secondary | ICD-10-CM | POA: Diagnosis present

## 2020-10-23 DIAGNOSIS — K810 Acute cholecystitis: Secondary | ICD-10-CM | POA: Diagnosis not present

## 2020-10-23 DIAGNOSIS — K8062 Calculus of gallbladder and bile duct with acute cholecystitis without obstruction: Principal | ICD-10-CM | POA: Diagnosis present

## 2020-10-23 DIAGNOSIS — G894 Chronic pain syndrome: Secondary | ICD-10-CM | POA: Diagnosis not present

## 2020-10-23 DIAGNOSIS — Z48815 Encounter for surgical aftercare following surgery on the digestive system: Secondary | ICD-10-CM | POA: Diagnosis not present

## 2020-10-23 DIAGNOSIS — R600 Localized edema: Secondary | ICD-10-CM | POA: Diagnosis present

## 2020-10-23 DIAGNOSIS — Z20822 Contact with and (suspected) exposure to covid-19: Secondary | ICD-10-CM | POA: Diagnosis not present

## 2020-10-23 DIAGNOSIS — K264 Chronic or unspecified duodenal ulcer with hemorrhage: Secondary | ICD-10-CM | POA: Diagnosis present

## 2020-10-23 DIAGNOSIS — Z833 Family history of diabetes mellitus: Secondary | ICD-10-CM

## 2020-10-23 DIAGNOSIS — F32A Depression, unspecified: Secondary | ICD-10-CM | POA: Diagnosis present

## 2020-10-23 DIAGNOSIS — Z950 Presence of cardiac pacemaker: Secondary | ICD-10-CM

## 2020-10-23 DIAGNOSIS — K8501 Idiopathic acute pancreatitis with uninfected necrosis: Secondary | ICD-10-CM | POA: Diagnosis not present

## 2020-10-23 DIAGNOSIS — G43909 Migraine, unspecified, not intractable, without status migrainosus: Secondary | ICD-10-CM | POA: Diagnosis present

## 2020-10-23 DIAGNOSIS — Z888 Allergy status to other drugs, medicaments and biological substances status: Secondary | ICD-10-CM | POA: Diagnosis not present

## 2020-10-23 DIAGNOSIS — Z885 Allergy status to narcotic agent status: Secondary | ICD-10-CM | POA: Diagnosis not present

## 2020-10-23 DIAGNOSIS — R748 Abnormal levels of other serum enzymes: Secondary | ICD-10-CM | POA: Diagnosis not present

## 2020-10-23 DIAGNOSIS — E119 Type 2 diabetes mellitus without complications: Secondary | ICD-10-CM | POA: Diagnosis not present

## 2020-10-23 DIAGNOSIS — K838 Other specified diseases of biliary tract: Secondary | ICD-10-CM | POA: Diagnosis not present

## 2020-10-23 DIAGNOSIS — R001 Bradycardia, unspecified: Secondary | ICD-10-CM | POA: Diagnosis present

## 2020-10-23 DIAGNOSIS — Z7982 Long term (current) use of aspirin: Secondary | ICD-10-CM | POA: Diagnosis not present

## 2020-10-23 DIAGNOSIS — D696 Thrombocytopenia, unspecified: Secondary | ICD-10-CM | POA: Diagnosis present

## 2020-10-23 DIAGNOSIS — Z803 Family history of malignant neoplasm of breast: Secondary | ICD-10-CM | POA: Diagnosis not present

## 2020-10-23 DIAGNOSIS — Z79899 Other long term (current) drug therapy: Secondary | ICD-10-CM

## 2020-10-23 DIAGNOSIS — Z8261 Family history of arthritis: Secondary | ICD-10-CM

## 2020-10-23 DIAGNOSIS — G4733 Obstructive sleep apnea (adult) (pediatric): Secondary | ICD-10-CM | POA: Diagnosis not present

## 2020-10-23 DIAGNOSIS — Z8249 Family history of ischemic heart disease and other diseases of the circulatory system: Secondary | ICD-10-CM | POA: Diagnosis not present

## 2020-10-23 DIAGNOSIS — K8050 Calculus of bile duct without cholangitis or cholecystitis without obstruction: Secondary | ICD-10-CM

## 2020-10-23 DIAGNOSIS — K219 Gastro-esophageal reflux disease without esophagitis: Secondary | ICD-10-CM | POA: Diagnosis not present

## 2020-10-23 DIAGNOSIS — F419 Anxiety disorder, unspecified: Secondary | ICD-10-CM | POA: Diagnosis not present

## 2020-10-23 DIAGNOSIS — K812 Acute cholecystitis with chronic cholecystitis: Secondary | ICD-10-CM | POA: Diagnosis not present

## 2020-10-23 DIAGNOSIS — Z800 Family history of malignant neoplasm of digestive organs: Secondary | ICD-10-CM

## 2020-10-23 DIAGNOSIS — R932 Abnormal findings on diagnostic imaging of liver and biliary tract: Secondary | ICD-10-CM | POA: Diagnosis not present

## 2020-10-23 DIAGNOSIS — R935 Abnormal findings on diagnostic imaging of other abdominal regions, including retroperitoneum: Secondary | ICD-10-CM | POA: Diagnosis not present

## 2020-10-23 DIAGNOSIS — M542 Cervicalgia: Secondary | ICD-10-CM | POA: Diagnosis present

## 2020-10-23 DIAGNOSIS — I10 Essential (primary) hypertension: Secondary | ICD-10-CM | POA: Diagnosis present

## 2020-10-23 DIAGNOSIS — R7401 Elevation of levels of liver transaminase levels: Secondary | ICD-10-CM | POA: Diagnosis not present

## 2020-10-23 DIAGNOSIS — R195 Other fecal abnormalities: Secondary | ICD-10-CM | POA: Diagnosis not present

## 2020-10-23 DIAGNOSIS — K831 Obstruction of bile duct: Secondary | ICD-10-CM | POA: Diagnosis not present

## 2020-10-23 MED ORDER — SODIUM CHLORIDE 0.9 % IV SOLN
2.0000 g | Freq: Three times a day (TID) | INTRAVENOUS | Status: DC
  Administered 2020-10-24 – 2020-10-26 (×7): 2 g via INTRAVENOUS
  Filled 2020-10-23 (×7): qty 2

## 2020-10-23 MED ORDER — LACTATED RINGERS IV SOLN: INTRAVENOUS | Status: DC

## 2020-10-23 MED ORDER — ROPINIROLE HCL 0.5 MG PO TABS
1.0000 mg | ORAL_TABLET | Freq: Every day | ORAL | Status: DC
  Administered 2020-10-23 – 2020-10-28 (×6): 1 mg via ORAL
  Filled 2020-10-23 (×8): qty 2

## 2020-10-23 MED ORDER — FLUOXETINE HCL 20 MG PO CAPS
40.0000 mg | ORAL_CAPSULE | Freq: Every morning | ORAL | Status: DC
  Administered 2020-10-24 – 2020-10-29 (×6): 40 mg via ORAL
  Filled 2020-10-23 (×5): qty 2

## 2020-10-23 MED ORDER — MELATONIN 5 MG PO TABS
10.0000 mg | ORAL_TABLET | Freq: Every day | ORAL | Status: DC
  Administered 2020-10-23 – 2020-10-28 (×6): 10 mg via ORAL
  Filled 2020-10-23 (×6): qty 2

## 2020-10-23 MED ORDER — SODIUM CHLORIDE 0.9 % IV SOLN
2.0000 g | Freq: Once | INTRAVENOUS | Status: AC
  Administered 2020-10-23: 2 g via INTRAVENOUS
  Filled 2020-10-23: qty 2

## 2020-10-23 MED ORDER — ONDANSETRON HCL 4 MG PO TABS: 4.0000 mg | ORAL_TABLET | Freq: Four times a day (QID) | ORAL | Status: DC | PRN

## 2020-10-23 MED ORDER — FLUOXETINE HCL 20 MG PO CAPS
20.0000 mg | ORAL_CAPSULE | Freq: Every day | ORAL | Status: DC
  Administered 2020-10-23 – 2020-10-28 (×6): 20 mg via ORAL
  Filled 2020-10-23 (×7): qty 1

## 2020-10-23 MED ORDER — ONDANSETRON HCL 4 MG/2ML IJ SOLN
4.0000 mg | Freq: Four times a day (QID) | INTRAMUSCULAR | Status: DC | PRN
  Administered 2020-10-24 – 2020-10-26 (×3): 4 mg via INTRAVENOUS
  Filled 2020-10-23 (×3): qty 2

## 2020-10-23 MED ORDER — METRONIDAZOLE IN NACL 5-0.79 MG/ML-% IV SOLN
500.0000 mg | Freq: Three times a day (TID) | INTRAVENOUS | Status: DC
  Administered 2020-10-23 – 2020-10-27 (×11): 500 mg via INTRAVENOUS
  Filled 2020-10-23 (×12): qty 100

## 2020-10-23 MED ORDER — FERROUS SULFATE 325 (65 FE) MG PO TABS
325.0000 mg | ORAL_TABLET | Freq: Every day | ORAL | Status: DC
  Administered 2020-10-24: 325 mg via ORAL
  Filled 2020-10-23: qty 1

## 2020-10-23 MED ORDER — LACTATED RINGERS IV BOLUS
500.0000 mL | Freq: Once | INTRAVENOUS | Status: AC
  Administered 2020-10-23: 500 mL via INTRAVENOUS

## 2020-10-23 MED ORDER — ACETAMINOPHEN 650 MG RE SUPP
650.0000 mg | Freq: Four times a day (QID) | RECTAL | Status: DC | PRN
Start: 2020-10-23 — End: 2020-10-29

## 2020-10-23 MED ORDER — ATORVASTATIN CALCIUM 10 MG PO TABS
20.0000 mg | ORAL_TABLET | Freq: Every day | ORAL | Status: DC
Start: 2020-10-23 — End: 2020-10-29
  Administered 2020-10-23 – 2020-10-28 (×6): 20 mg via ORAL
  Filled 2020-10-23 (×6): qty 2

## 2020-10-23 MED ORDER — OXYCODONE HCL 5 MG PO TABS
10.0000 mg | ORAL_TABLET | Freq: Four times a day (QID) | ORAL | Status: DC | PRN
  Administered 2020-10-24: 10 mg via ORAL
  Filled 2020-10-23: qty 2

## 2020-10-23 MED ORDER — ACETAMINOPHEN 325 MG PO TABS: 650.0000 mg | ORAL_TABLET | Freq: Four times a day (QID) | ORAL | Status: DC | PRN

## 2020-10-23 NOTE — H&P (Signed)
History and Physical    Mark [name]:[id number] DOB: 11-14-1955 DOA: 10/23/2020  PCP: Ria Bush, MD  Patient coming from: [facility] transfer  I have personally briefly reviewed patient's old medical records in [facility]  Chief Complaint: Choledocholithiasis  HPI: Mark [name] is a 65 y.o. male with medical history significant of CPS, OSA, PPM.  Pt presented to [facility] with 3 day h/o abd pain, N/V.  Symptoms were progressively worsening, severe.  Pt was admitted to [facility] yesterday with acute cholecystitis.  HIDA scan has come back suggesting choledocholithiasis and biliary stasis.  His Bilirubin has trended up from 1.6 to 5.4 to 8.1 this morning.  Lipase went from 85 to 3849 to 1055.  Transaminases have also trended up.  He has been on cefepime and flagyl.  Due to h/o PPM it is unclear wether he can get an MRCP: PPM was changed out in 2019 to an MRI compatible model, but looks like the original leads (placed in 2009) were used.  Regardless [facility] has no MRI on the weekends.  Therefore he was transferred to [facility] for GI consult and possible ERCP.   Currently: Pt doing well, no complaints at this time other than he feels dry.   Review of Systems: As per HPI, otherwise all review of systems negative.  Past Medical History:  Diagnosis Date  . Anxiety   . Arthritis   . Chronic pain   . Depression   . Heart murmur   . History of kidney stones April 12,2017   removed two kidney stones about 1 cm in size  . History of permanent cardiac pacemaker placement 05/31/2015  . Hydrocele in adult 05/07/2015  . Kidney stones   . Migraine   . MRSA carrier 2019  . Presence of permanent cardiac pacemaker   . Prostatitis   . Sleep apnea   . Sleep apnea    getting new cpap machine    Past Surgical History:  Procedure Laterality Date  . CARDIOVASCULAR STRESS TEST  2014   Dr Ubaldo Glassing  . CARPAL TUNNEL RELEASE Bilateral 2006  . CERVICAL SPINE SURGERY  2008   C5/6  .  COLONOSCOPY  07/2007   prostate nodule, hemorrhoid, rpt 5 yrs [name])  . IMPLANTABLE CARDIOVERTER DEFIBRILLATOR (ICD) GENERATOR CHANGE Left 12/13/2017   Procedure: PACER CHANGE OUT;  Surgeon: Isaias Cowman, MD;  Location: [facility] ORS;  Service: Cardiovascular;  Laterality: Left;  . LITHOTRIPSY Bilateral 2012   Stoioff  . LITHOTRIPSY Left 11/2015   x2 Stoioff  . pace maker revision  12/13/2017  . PACEMAKER PLACEMENT  2009   s/p Medtronic Adapta DR Pacemaker (Dr. Ubaldo Glassing)  . ROTATOR CUFF REPAIR Left 2009  . TONSILLECTOMY  1964  . ULNAR NERVE REPAIR  2008   Left     reports that he has never smoked. He has never used smokeless tobacco. He reports that he does not drink alcohol and does not use drugs.  Allergies  Allergen Reactions  . Ciprofloxacin Other (See Comments)    PANIC ATTACKS (Fluoroquinolones)   . Metformin Other (See Comments)    Bad acid reflux  . Buprenorphine Hcl Nausea Only  . Morphine And Related Nausea Only  . Penicillins Rash    Rash Has patient had a PCN reaction causing immediate rash, facial/tongue/throat swelling, SOB or lightheadedness with hypotension: Unknown Has patient had a PCN reaction causing severe rash involving mucus membranes or skin necrosis: Unknown Has patient had a PCN reaction that required hospitalization: No Has patient  had a PCN reaction occurring within the last 10 years: No CHILDHOOD REACTION. If all of the above answers are "NO", then may proceed with Cephalosporin use.     Family History  Problem Relation Age of Onset  . Heart disease Mother   . Diabetes Mother   . Arthritis Mother   . Cancer Mother        Breast Cancer  . Heart block Father   . Heart disease Father   . Arthritis Father   . Cancer Father        skin cancer     Prior to Admission medications   Medication Sig Start Date End Date Taking? Authorizing Provider  Ascorbic Acid (VITAMIN C) 1000 MG tablet Take 1,000 mg by mouth daily.    [provider]  aspirin 81 MG tablet Take 81 mg by mouth daily.    [provider]  atorvastatin (LIPITOR) 20 MG tablet Take 1 tablet (20 mg total) by mouth at bedtime. 09/14/20   Ria Bush, MD  cetirizine (ZYRTEC) 10 MG tablet Take 10 mg by mouth at bedtime.     [provider]  Cholecalciferol (VITAMIN D) 2000 units CAPS Take 2,000 Units by mouth daily.     [provider]  CRANBERRY PO Take 4,200 mg by mouth at bedtime.     [provider]  cyclobenzaprine (FLEXERIL) 10 MG tablet TAKE 1 TABLET BY MOUTH TWICE DAILY AS NEEDED FOR MUSCLE SPASMS GENERIC EQUIVALENT FOR FLEXERIL 07/09/20   Ria Bush, MD  desonide (DESOWEN) 0.05 % ointment Apply 1 application topically 2 (two) times daily as needed (for rash on chin due to dryness). 03/19/18   Bedsole, Amy E, MD  dicyclomine (BENTYL) 20 MG tablet Take 20 mg by mouth 2 (two) times daily. 10/22/20   [provider]  diphenhydrAMINE (BENADRYL) 50 MG capsule Take 50 mg by mouth at bedtime.    [provider]  docusate sodium (COLACE) 100 MG capsule Take 100 mg by mouth 2 (two) times daily as needed.     [provider]  FLUoxetine (PROZAC) 20 MG capsule Take 1 capsule (20 mg total) by mouth daily. 09/14/20   Ria Bush, MD  FLUoxetine (PROZAC) 40 MG capsule Take 1 capsule (40 mg total) by mouth daily. 09/14/20   Ria Bush, MD  fluticasone [facility]) 50 MCG/ACT nasal spray Place 2 sprays into both nostrils daily. 09/14/20   Ria Bush, MD  furosemide (LASIX) 20 MG tablet Take 1 tablet (20 mg total) by mouth daily. 09/14/20   Ria Bush, MD  Garlic 6237 MG CAPS Take 1,000 mg by mouth daily.    [provider]  Melatonin 10 MG TABS Take 10 mg by mouth at bedtime as needed.    [provider]  Multiple Vitamin (MULTIVITAMIN PO) Take 1 tablet by mouth daily.    [provider]  Multiple Vitamins-Minerals (CERTAVITE SENIOR/ANTIOXIDANT) TABS  08/07/08    [provider]  naloxone Karma Greaser) 4 MG/0.1ML LIQD nasal spray kit Spray into one nostril. Repeat with second device into other nostril after 2-3 minutes if no or minimal response. 03/03/20   Milinda Pointer, MD  naproxen (NAPROSYN) 500 MG tablet Take 1 tablet (500 mg total) by mouth 2 (two) times daily as needed for moderate pain. 08/12/20   Ria Bush, MD  Omega-3 1000 MG CAPS Take 1,000 mg by mouth at bedtime.    [provider]  omeprazole (PRILOSEC) 20 MG capsule Take 1 capsule (20 mg  total) by mouth daily as needed (GERD). 09/14/20   Ria Bush, MD  Oxycodone HCl 10 MG TABS Take 1 tablet (10 mg total) by mouth every 6 (six) hours as needed. Must last 30 days 09/12/20 10/12/20  Milinda Pointer, MD  Oxycodone HCl 10 MG TABS Take 1 tablet (10 mg total) by mouth every 6 (six) hours as needed. Must last 30 days 10/12/20 11/11/20  Milinda Pointer, MD  Oxycodone HCl 10 MG TABS Take 1 tablet (10 mg total) by mouth every 6 (six) hours as needed. Must last 30 days 11/11/20 12/11/20  Milinda Pointer, MD  potassium citrate (UROCIT-K) 10 MEQ (1080 MG) SR tablet Take 1 tablet (10 mEq total) by mouth 2 (two) times daily. 09/11/19   Stoioff, Ronda Fairly, MD  rOPINIRole (REQUIP) 1 MG tablet Take 1 tablet (1 mg total) by mouth at bedtime. 09/14/20   Ria Bush, MD  Saw Palmetto, Serenoa repens, (SAW PALMETTO PO) Take 450 mg by mouth 2 (two) times daily.     [provider]  sildenafil (REVATIO) 20 MG tablet Take 1 tablet (20 mg total) by mouth 3 (three) times daily. 09/09/20   Stoioff, Ronda Fairly, MD  VITAMIN B COMPLEX-C CAPS Take by mouth daily.     [provider]  vitamin E 1000 UNIT capsule Take 1,000 Units by mouth daily.  01/29/17   [provider]    Physical Exam: Vitals:   10/23/20 2032  BP: (!) 142/74  Pulse: 62  Resp: 18  Temp: 98.1 F (36.7 C)  TempSrc: Oral  SpO2: 100%    Constitutional: NAD, calm, comfortable Eyes: PERRL, lids and  conjunctivae normal ENMT: Mucous membranes are moist. Posterior pharynx clear of any exudate or lesions.Normal dentition.  Neck: normal, supple, no masses, no thyromegaly Respiratory: clear to auscultation bilaterally, no wheezing, no crackles. Normal respiratory effort. No accessory muscle use.  Cardiovascular: Regular rate and rhythm, no murmurs / rubs / gallops. No extremity edema. 2+ pedal pulses. No carotid bruits.  Abdomen: no tenderness, no masses palpated. No hepatosplenomegaly. Bowel sounds positive.  Musculoskeletal: no clubbing / cyanosis. No joint deformity upper and lower extremities. Good ROM, no contractures. Normal muscle tone.  Skin: no rashes, lesions, ulcers. No induration Neurologic: CN 2-12 grossly intact. Sensation intact, DTR normal. Strength 5/5 in all 4.  Psychiatric: Normal judgment and insight. Alert and oriented x 3. Normal mood.    Labs on Admission: I have personally reviewed following labs and imaging studies  CBC: No results for input(s): WBC, NEUTROABS, HGB, HCT, MCV, PLT in the last 168 hours. Basic Metabolic Panel: No results for input(s): NA, K, CL, CO2, GLUCOSE, BUN, CREATININE, CALCIUM, MG, PHOS in the last 168 hours. GFR: CrCl cannot be calculated (Patient's most recent lab result is older than the maximum 21 days allowed.). Liver Function Tests: No results for input(s): AST, ALT, ALKPHOS, BILITOT, PROT, ALBUMIN in the last 168 hours. No results for input(s): LIPASE, AMYLASE in the last 168 hours. No results for input(s): AMMONIA in the last 168 hours. Coagulation Profile: No results for input(s): INR, PROTIME in the last 168 hours. Cardiac Enzymes: No results for input(s): CKTOTAL, CKMB, CKMBINDEX, TROPONINI in the last 168 hours. BNP (last 3 results) No results for input(s): PROBNP in the last 8760 hours. HbA1C: No results for input(s): HGBA1C in the last 72 hours. CBG: No results for input(s): GLUCAP in the last 168 hours. Lipid Profile: No  results for input(s): CHOL, HDL, LDLCALC, TRIG, CHOLHDL, LDLDIRECT in the  last 72 hours. Thyroid Function Tests: No results for input(s): TSH, T4TOTAL, FREET4, T3FREE, THYROIDAB in the last 72 hours. Anemia Panel: No results for input(s): VITAMINB12, FOLATE, FERRITIN, TIBC, IRON, RETICCTPCT in the last 72 hours. Urine analysis: No results found for: COLORURINE, APPEARANCEUR, LABSPEC, PHURINE, GLUCOSEU, HGBUR, BILIRUBINUR, KETONESUR, PROTEINUR, UROBILINOGEN, NITRITE, LEUKOCYTESUR  Radiological Exams on Admission: No results found.  EKG: Independently reviewed.  Assessment/Plan Principal Problem:   Choledocholithiasis Active Problems:   Chronic pain syndrome   History of permanent cardiac pacemaker placement   Obstructive sleep apnea   Acute cholecystitis   Acute gallstone pancreatitis    1. Acute cholecystitis, pancreatitis, probable choledocholithiasis - 1. NPO except ice chips 2. IVF: 500cc bolus then LR at 125 3. Repeat CBC/CMP in AM 4. Cont cefepime / flagyl from OSH 5. GI to see pt in AM and decide on ERCP vs MRCP (if he can even get the latter) as next step. 2. PPM - 1. Pacemaker generator changed out to an MRI compatible model in 2019... 2. However, still has the leads from 2009 in place 3. So not clear if can do MRI on this or not 4. Getting pacemaker interrogation to see if we can figure out what the lead models are 3. CPS - 1. Cont home pain meds  DVT prophylaxis: SCDs Code Status: Full Family Communication: No family in room Disposition Plan: Home after choledocholithiasis resolved Consults called: Message sent to Dr. Tarri Glenn for GI consult in AM Admission status: Admit to inpatient  Severity of Illness: The appropriate patient status for this patient is INPATIENT. Inpatient status is judged to be reasonable and necessary in order to provide the required intensity of service to ensure the patient's safety. The patient's presenting symptoms, physical exam  findings, and initial radiographic and laboratory data in the context of their chronic comorbidities is felt to place them at high risk for further clinical deterioration. Furthermore, it is not anticipated that the patient will be medically stable for discharge from the hospital within 2 midnights of admission. The following factors support the patient status of inpatient.   IP status due to acute cholecystitis, pancreatitis, choledocholithiasis, probably needs ERCP and cholecystectomy.   * I certify that at the point of admission it is my clinical judgment that the patient will require inpatient hospital care spanning beyond 2 midnights from the point of admission due to high intensity of service, high risk for further deterioration and high frequency of surveillance required.*    ,  M. DO [facility]  How to contact the [facility] Attending or Consulting provider [place] or covering provider during after hours [place], for this patient?  1. Check the care team in [facility] and look for a) attending/consulting [facility] provider listed and b) the [facility] team listed 2. Log into [email or web address]  Amion Physician Scheduling and messaging for groups and whole hospitals  On call and physician scheduling software for group practices, residents, hospitalists and other medical providers for call, clinic, rotation and shift schedules. On[employer] is a hospital-wide system for scheduling doctors and paging doctors on call. EasyPlot is for scientific plotting and data analysis.  [email or web address]  and use [place]'s universal password to access. If you do not have the password, please contact the hospital operator.  3. Locate the [facility] provider you are looking for under [facility] and page to a number that you can be directly reached. 4. If you still have difficulty reaching the provider, please page the [facility] (Director on  Call) for the Hospitalists listed on amion for assistance.  10/23/2020, 9:10 PM

## 2020-10-23 NOTE — Progress Notes (Signed)
Pharmacy Antibiotic Note  Mark [name] is a 65 y.o. male admitted on 10/23/2020 with acute cholecystitis, pancreatitis, probable choledocholithiasis.  Pharmacy has been consulted for cefepime dosing. Patient is also on metronidazole.  Good renal function. WBC wnl. Afebrile.   Plan: Cefepime 2g Q 8hr Continue metronidazole per MD  Monitor cultures, clinical status, renal fx Narrow abx as able and f/u duration    Temp (24hrs), Avg:98.1 F (36.7 C), Min:98.1 F (36.7 C), Max:98.1 F (36.7 C)  No results for input(s): WBC, CREATININE, LATICACIDVEN, VANCOTROUGH, VANCOPEAK, VANCORANDOM, GENTTROUGH, GENTPEAK, GENTRANDOM, TOBRATROUGH, TOBRAPEAK, TOBRARND, AMIKACINPEAK, AMIKACINTROU, AMIKACIN in the last 168 hours.  CrCl cannot be calculated (Patient's most recent lab result is older than the maximum 21 days allowed.).    Allergies  Allergen Reactions  . Ciprofloxacin Other (See Comments)    PANIC ATTACKS (Fluoroquinolones)   . Metformin Other (See Comments)    Bad acid reflux  . Buprenorphine Hcl Nausea Only  . Morphine And Related Nausea Only  . Penicillins Rash    Rash Has patient had a PCN reaction causing immediate rash, facial/tongue/throat swelling, SOB or lightheadedness with hypotension: Unknown Has patient had a PCN reaction causing severe rash involving mucus membranes or skin necrosis: Unknown Has patient had a PCN reaction that required hospitalization: No Has patient had a PCN reaction occurring within the last 10 years: No CHILDHOOD REACTION. If all of the above answers are "NO", then may proceed with Cephalosporin use.     Antimicrobials this admission: Cefe 3/19 >>  MTZ 3/19 >>    Microbiology results: None sent  Thank you for allowing pharmacy to be a part of this patient's care.  Alphia Moh, PharmD, BCPS, BCCP Clinical Pharmacist  Please check AMION for all [facility] Pharmacy phone numbers After 10:00 PM, call Main Pharmacy [phone]

## 2020-10-23 NOTE — Progress Notes (Signed)
Patient refused bipap tonight. Pt stated he has not used his in over a year at home.

## 2020-10-24 DIAGNOSIS — K810 Acute cholecystitis: Secondary | ICD-10-CM

## 2020-10-24 DIAGNOSIS — R748 Abnormal levels of other serum enzymes: Secondary | ICD-10-CM

## 2020-10-24 LAB — COMPREHENSIVE METABOLIC PANEL
ALT: 223 U/L — ABNORMAL HIGH (ref 0–44)
AST: 202 U/L — ABNORMAL HIGH (ref 15–41)
Albumin: 2.8 g/dL — ABNORMAL LOW (ref 3.5–5.0)
Alkaline Phosphatase: 138 U/L — ABNORMAL HIGH (ref 38–126)
Anion gap: 11 (ref 5–15)
BUN: 19 mg/dL (ref 8–23)
CO2: 21 mmol/L — ABNORMAL LOW (ref 22–32)
Calcium: 8.7 mg/dL — ABNORMAL LOW (ref 8.9–10.3)
Chloride: 109 mmol/L (ref 98–111)
Creatinine, Ser: 0.98 mg/dL (ref 0.61–1.24)
GFR, Estimated: >60 mL/min (ref >60)
Glucose, Bld: 111 mg/dL — ABNORMAL HIGH (ref 70–99)
Potassium: 4.1 mmol/L (ref 3.5–5.1)
Sodium: 141 mmol/L (ref 135–145)
Total Bilirubin: 7.7 mg/dL — ABNORMAL HIGH (ref 0.3–1.2)
Total Protein: 5.5 g/dL — ABNORMAL LOW (ref 6.5–8.1)

## 2020-10-24 LAB — CBC
HCT: 39.2 % (ref 39.0–52.0)
Hemoglobin: 13.8 g/dL (ref 13.0–17.0)
MCH: 30.6 pg (ref 26.0–34.0)
MCHC: 35.2 g/dL (ref 30.0–36.0)
MCV: 86.9 fL (ref 80.0–100.0)
Platelets: 133 10*3/uL — ABNORMAL LOW (ref 150–400)
RBC: 4.51 MIL/uL (ref 4.22–5.81)
RDW: 13.6 % (ref 11.5–15.5)
WBC: 8.4 10*3/uL (ref 4.0–10.5)
nRBC: 0 % (ref 0.0–0.2)

## 2020-10-24 LAB — HIV ANTIBODY (ROUTINE TESTING W REFLEX): HIV Screen 4th Generation wRfx: NONREACTIVE

## 2020-10-24 LAB — SARS CORONAVIRUS 2 (TAT 6-24 HRS): SARS Coronavirus 2: NEGATIVE

## 2020-10-24 MED ORDER — ZOLPIDEM TARTRATE 5 MG PO TABS
5.0000 mg | ORAL_TABLET | Freq: Every evening | ORAL | Status: DC | PRN
  Administered 2020-10-24 – 2020-10-27 (×3): 5 mg via ORAL
  Filled 2020-10-24 (×3): qty 1

## 2020-10-24 MED ORDER — OXYCODONE HCL 5 MG PO TABS
10.0000 mg | ORAL_TABLET | Freq: Four times a day (QID) | ORAL | Status: DC | PRN
  Administered 2020-10-24 – 2020-10-25 (×3): 10 mg via ORAL
  Filled 2020-10-24 (×3): qty 2

## 2020-10-24 MED ORDER — SODIUM CHLORIDE 0.9 % IV SOLN
12.5000 mg | Freq: Four times a day (QID) | INTRAVENOUS | Status: DC | PRN
  Filled 2020-10-24 (×2): qty 0.5

## 2020-10-24 NOTE — Progress Notes (Signed)
Patient arrived to 6N04, alert and oriented x4, ambulatory.  Denies any N/V. has 2/10 abdominal pain, took Oxy prior his transfer from [facility]. No acute distress noted and V/S stable. Will continue to close monitor patient

## 2020-10-24 NOTE — Consult Note (Signed)
[facility] Consult: 8:32 AM 10/24/2020  LOS: 1 day    Referring Provider: Dr Erlinda Hong  Primary Care Physician:  Ria Bush, MD Primary Gastroenterologist:  Dr. April Manson    Reason for Consultation:  ?  Choledocholithiasis.  Rising LFTs.   HPI: Mark [name] is a 65 y.o. male.  PMH cardiac pacemaker.  Arthritis.  Chronic pain syndrome.  HLD.  OSA.  LVEF 55 to 65% on 2D echo 10/2017. 07/2007 colonoscopy.  Nonbleeding, small, internal hemorrhoids.  Otherwise normal study. Small left prostate nodule on digital exam.  Plan will for repeat colonoscopy in 5 years due to family history colon cancer in first-degree relative. Cologuard positive 09/06/2017 and 09/21/2017 Patient put off having colonoscopy but he has appointment set up in June with [facility] in [place] for office evaluation and to schedule the colonoscopy.  Last Tuesday patient developed malaise, body aches, hot and cold sweats which came in waves lasting for few hours.  Nausea and nonbloody emesis.  Eventually developed mid abdominal pain.  This lasted for the next few days.  On Thursday evening he called EMS and was sent to the ER where they ran tests.  Needed an ultrasound but that was not available and was asked to return the next day for the ultrasound.  Went home but returned to the ED within a couple of hours as the pain was quite severe.  He stayed at [facility] until he was transferred to [facility] hospital yesterday evening.  In summary tests so far:  T bili 0 point >> 7.7.  Alk phos 56 >> 138.  AST/ALT 26/29 >> 202/223 No leukocytosis.  Platelets 162 >> 133. 10/21/2020 CTAP w contrast: Suspicion for mild, soft tissue stranding/edema at the porta hepatis and possible GB wall thickening.  No gallstones visualized. 10/22/2020 RUQ ultrasound: GB sludge,  mild GB wall thickening, no pericholecystic fluid or evidence for cholecystitis.  No cholelithiasis. 10/22/2020 HIDA scan.  No biliary excretion, no contrast activity in the GB or small bowel concerning for biliary stasis and cholecystitis. As it is unknown what type of pacemaker the patient has, MRCP has not been performed.  Initiated on Maxipime, Flagyl.  Feeling better. Takes omeprazole daily along with Naprosyn daily.  Past Medical History:  Diagnosis Date  . Anxiety   . Arthritis   . Chronic pain   . Depression   . Heart murmur   . History of kidney stones April 12,2017   removed two kidney stones about 1 cm in size  . History of permanent cardiac pacemaker placement 05/31/2015  . Hydrocele in adult 05/07/2015  . Kidney stones   . Migraine   . MRSA carrier 2019  . Presence of permanent cardiac pacemaker   . Prostatitis   . Sleep apnea   . Sleep apnea    getting new cpap machine    Past Surgical History:  Procedure Laterality Date  . CARDIOVASCULAR STRESS TEST  2014   Dr Ubaldo Glassing  . CARPAL TUNNEL RELEASE Bilateral 2006  . CERVICAL SPINE SURGERY  2008   C5/6  .  COLONOSCOPY  07/2007   prostate nodule, hemorrhoid, rpt 5 yrs [name])  . IMPLANTABLE CARDIOVERTER DEFIBRILLATOR (ICD) GENERATOR CHANGE Left 12/13/2017   Procedure: PACER CHANGE OUT;  Surgeon: Isaias Cowman, MD;  Location: [facility] ORS;  Service: Cardiovascular;  Laterality: Left;  . LITHOTRIPSY Bilateral 2012   Stoioff  . LITHOTRIPSY Left 11/2015   x2 Stoioff  . pace maker revision  12/13/2017  . PACEMAKER PLACEMENT  2009   s/p Medtronic Adapta DR Pacemaker (Dr. Ubaldo Glassing)  . ROTATOR CUFF REPAIR Left 2009  . TONSILLECTOMY  1964  . ULNAR NERVE REPAIR  2008   Left    Prior to Admission medications   Medication Sig Start Date End Date Taking? Authorizing Provider  Ascorbic Acid (VITAMIN C) 1000 MG tablet Take 1,000 mg by mouth daily.   Yes [provider]  aspirin 81 MG tablet Take 81 mg by mouth daily.    Yes [provider]  atorvastatin (LIPITOR) 20 MG tablet Take 1 tablet (20 mg total) by mouth at bedtime. 09/14/20  Yes Ria Bush, MD  cetirizine (ZYRTEC) 10 MG tablet Take 10 mg by mouth at bedtime.    Yes [provider]  Cholecalciferol (VITAMIN D) 2000 units CAPS Take 2,000 Units by mouth daily.    Yes [provider]  CRANBERRY PO Take 4,200 mg by mouth at bedtime.    Yes [provider]  cyclobenzaprine (FLEXERIL) 10 MG tablet TAKE 1 TABLET BY MOUTH TWICE DAILY AS NEEDED FOR MUSCLE SPASMS GENERIC EQUIVALENT FOR FLEXERIL 07/09/20  Yes Ria Bush, MD  diphenhydrAMINE (BENADRYL) 50 MG capsule Take 50 mg by mouth at bedtime.   Yes [provider]  ferrous sulfate 325 (65 FE) MG EC tablet Take 325 mg by mouth daily with breakfast.   Yes [provider]  FLUoxetine (PROZAC) 20 MG capsule Take 1 capsule (20 mg total) by mouth daily. Patient taking differently: Take 20 mg by mouth at bedtime. 09/14/20  Yes Ria Bush, MD  FLUoxetine (PROZAC) 40 MG capsule Take 1 capsule (40 mg total) by mouth daily. Patient taking differently: Take 40 mg by mouth in the morning. 09/14/20  Yes Ria Bush, MD  fluticasone [facility]) 50 MCG/ACT nasal spray Place 2 sprays into both nostrils daily. 09/14/20  Yes Ria Bush, MD  furosemide (LASIX) 20 MG tablet Take 1 tablet (20 mg total) by mouth daily. 09/14/20  Yes Ria Bush, MD  Garlic 3267 MG CAPS Take 1,000 mg by mouth daily.   Yes [provider]  Melatonin 10 MG TABS Take 10 mg by mouth at bedtime.   Yes [provider]  Multiple Vitamins-Minerals (CERTAVITE SENIOR/ANTIOXIDANT) TABS Take 1 tablet by mouth daily. 08/07/08  Yes [provider]  Omega-3 1000 MG CAPS Take 1,000 mg by mouth at bedtime.   Yes [provider]  Oxycodone HCl 10 MG TABS Take 1 tablet (10 mg total) by mouth every 6 (six) hours as needed. Must last 30 days Patient taking  differently: Take 10 mg by mouth every 6 (six) hours as needed (pain). Must last 30 days 11/11/20 12/11/20 Yes Milinda Pointer, MD  potassium citrate (UROCIT-K) 10 MEQ (1080 MG) SR tablet Take 1 tablet (10 mEq total) by mouth 2 (two) times daily. 09/11/19  Yes Stoioff, Ronda Fairly, MD  rOPINIRole (REQUIP) 1 MG tablet Take 1 tablet (1 mg total) by mouth at bedtime. 09/14/20  Yes Ria Bush, MD  Saw Palmetto, Serenoa repens, (SAW PALMETTO PO) Take 450 mg by mouth 2 (two)  times daily.    Yes [provider]  VITAMIN B COMPLEX-C CAPS Take 1 capsule by mouth daily.   Yes [provider]  vitamin E 1000 UNIT capsule Take 1,000 Units by mouth daily.  01/29/17  Yes [provider]  dicyclomine (BENTYL) 20 MG tablet Take 20 mg by mouth 2 (two) times daily as needed for spasms. 10/22/20   [provider]  naloxone Karma Greaser) 4 MG/0.1ML LIQD nasal spray kit Spray into one nostril. Repeat with second device into other nostril after 2-3 minutes if no or minimal response. 03/03/20   Milinda Pointer, MD  naproxen (NAPROSYN) 500 MG tablet Take 1 tablet (500 mg total) by mouth 2 (two) times daily as needed for moderate pain. 08/12/20   Ria Bush, MD  omeprazole (PRILOSEC) 20 MG capsule Take 1 capsule (20 mg total) by mouth daily as needed (GERD). 09/14/20   Ria Bush, MD  sildenafil (REVATIO) 20 MG tablet Take 1 tablet (20 mg total) by mouth 3 (three) times daily. 09/09/20   Stoioff, Ronda Fairly, MD    Scheduled Meds: . atorvastatin  20 mg Oral QHS  . ferrous sulfate  325 mg Oral Q breakfast  . FLUoxetine  20 mg Oral QHS  . FLUoxetine  40 mg Oral q AM  . melatonin  10 mg Oral QHS  . rOPINIRole  1 mg Oral QHS   Infusions: . ceFEPime (MAXIPIME) IV 2 g (10/24/20 0543)  . lactated ringers 125 mL/hr at 10/24/20 0659  . metronidazole 500 mg (10/24/20 0542)   PRN Meds: acetaminophen **OR** acetaminophen, ondansetron **OR** ondansetron (ZOFRAN) IV, oxyCODONE   Allergies as  of 10/22/2020 - Review Complete 09/14/2020  Allergen Reaction Noted  . Ciprofloxacin Other (See Comments) 03/27/2011  . Metformin Other (See Comments) 11/08/2015  . Buprenorphine hcl Nausea Only 11/08/2015  . Morphine and related Nausea Only 10/27/2014  . Penicillins Rash 03/27/2011    Family History  Problem Relation Age of Onset  . Heart disease Mother   . Diabetes Mother   . Arthritis Mother   . Cancer Mother        Breast Cancer  . Heart block Father   . Heart disease Father   . Arthritis Father   . Cancer Father        skin cancer    Social History   Socioeconomic History  . Marital status: Divorced    Spouse name: Not on file  . Number of children: Not on file  . Years of education: Not on file  . Highest education level: Not on file  Occupational History  . Not on file  Tobacco Use  . Smoking status: Never Smoker  . Smokeless tobacco: Never Used  Vaping Use  . Vaping Use: Never used  Substance and Sexual Activity  . Alcohol use: No  . Drug use: No  . Sexual activity: Never  Other Topics Concern  . Not on file  Social History Narrative   Divorced. Lives with wife.   Edu: 8th grade   Occ: retired, disability for chronic neck pain    Activity: no regular exercise - limited by back and knee pain   Diet: poor water, lots of tang and gatorade, no vegetables or fruits   Social Determinants of Health   Financial Resource Strain: Low Risk   . Difficulty of Paying Living Expenses: Not hard at all  Food Insecurity: No Food Insecurity  . Worried About Charity fundraiser in the Last Year: Never true  .  Ran Out of Food in the Last Year: Never true  Transportation Needs: No Transportation Needs  . Lack of Transportation (Medical): No  . Lack of Transportation (Non-Medical): No  Physical Activity: Sufficiently Active  . Days of Exercise per Week: 7 days  . Minutes of Exercise per Session: 30 min  Stress: No Stress Concern Present  . Feeling of Stress : Not at  all  Social Connections: Not on file  Intimate Partner Violence: Not At Risk  . Fear of Current or Ex-Partner: No  . Emotionally Abused: No  . Physically Abused: No  . Sexually Abused: No    REVIEW OF SYSTEMS: Constitutional: Malaise improved. ENT:  No nose bleeds Pulm: No shortness of breath or cough CV:  No palpitations, no LE edema.  GU:  No hematuria, no frequency GI: See HPI Heme: No unusual bleeding or bruising. Transfusions: None Neuro:  No headaches, no peripheral tingling or numbness Derm:  No itching, no rash or sores.  Endocrine:  No sweats or chills.  No polyuria or dysuria Immunization: Vaccine boosted with Moderna COVID-19 vaccine Travel:  None beyond local counties in last few months.    PHYSICAL EXAM: Vital signs in last 24 hours: Vitals:   10/24/20 0441 10/24/20 0829  BP: 132/61 128/80  Pulse: 60 60  Resp: 19 17  Temp: 97.9 F (36.6 C) (!) 97.5 F (36.4 C)  SpO2: 99% 97%   Wt Readings from Last 3 Encounters:  09/14/20 103.9 kg  09/09/20 102.5 kg  09/08/20 102.5 kg    General: Obese, pleasant, comfortable.  Does not look ill. Head: Facial asymmetry or swelling.  No signs of head trauma. Eyes: No scleral icterus.  No conjunctival pallor. Ears: Not hard of hearing. Nose: No congestion or discharge Mouth: Oropharynx moist, pink, clear.  Tongue midline. Neck: No JVD, no masses, no thyromegaly Lungs: Clear bilaterally. Heart: RRR.  No MRG. Abdomen: Soft.  Nontender.  Obese.  Active bowel sounds.  No HSM, masses, bruits, hernias..   Rectal: Deferred Musc/Skeltl: No joint redness or swelling Extremities: No CCE. Neurologic: Oriented x3.  Detailed historian.  Moves all 4 limbs without weakness or tremor. Skin: No rash, no sores, no telangiectasia no significant purpura or bruising. Nodes: No cervical adenopathy Psych: Pleasant, cooperative, calm.  Intake/Output from previous day: 03/19 0701 - 03/20 0700 In: 1725.9 [I.V.:1325.9; IV  Piggyback:400] Out: 1 [Urine:1] Intake/Output this shift: No intake/output data recorded.  LAB RESULTS: Recent Labs    10/24/20 0049  WBC 8.4  HGB 13.8  HCT 39.2  PLT 133*   BMET Lab Results  Component Value Date   NA 141 10/24/2020   NA 137 09/07/2020   NA 140 08/13/2019   K 4.1 10/24/2020   K 4.2 09/07/2020   K 3.6 08/13/2019   CL 109 10/24/2020   CL 104 09/07/2020   CL 101 08/13/2019   CO2 21 (L) 10/24/2020   CO2 29 09/07/2020   CO2 29 08/13/2019   GLUCOSE 111 (H) 10/24/2020   GLUCOSE 120 (H) 09/07/2020   GLUCOSE 140 (H) 08/13/2019   BUN 19 10/24/2020   BUN 20 09/07/2020   BUN 25 (H) 08/13/2019   CREATININE 0.98 10/24/2020   CREATININE 0.88 09/07/2020   CREATININE 1.03 08/13/2019   CALCIUM 8.7 (L) 10/24/2020   CALCIUM 9.4 09/07/2020   CALCIUM 9.3 08/13/2019   LFT Recent Labs    10/24/20 0049  PROT 5.5*  ALBUMIN 2.8*  AST 202*  ALT 223*  ALKPHOS 138*  BILITOT  7.7*   PT/INR Lab Results  Component Value Date   INR 1.14 12/06/2017   INR 1.0 06/03/2007   Hepatitis Panel No results for input(s): HEPBSAG, HCVAB, HEPAIGM, HEPBIGM in the last 72 hours. C-Diff No components found for: CDIFF Lipase  No results found for: LIPASE  Drugs of Abuse     Component Value Date/Time   LABOPIA POS (A) 03/28/2011 1554   COCAINSCRNUR NEG 03/28/2011 1554   LABBENZ NEG 03/28/2011 1554   AMPHETMU NEG 03/28/2011 1554     RADIOLOGY STUDIES: No results found.    IMPRESSION:   *  Cholelithiasis Rising LFTs raise concern for CBD stone though  CBD 4 mm on ultrasound. HIDA concerning for cholecystitis.  *    positive Cologuard in 2019.  Patient delayed suggestion for colonoscopy but has pending appointment with [facility] to get this performed.    PLAN:     *   Repeat the LFTs tomorrow morning.  *   Patient's girlfriend is supposed to bring over his pacemaker card so we can determine whether an MRCP is safe.  *    allow clear liquids.   Discontinue the oral iron because this can make for confusing question as to blood in the upper GI tract if present at endoscopy.  It can be restarted at discharge.  He is not currently anemic.  *    follow-up as planned with [facility] clinic for colonoscopy   Azucena Freed  10/24/2020, 8:32 AM Phone [phone]

## 2020-10-24 NOTE — Consult Note (Signed)
Surgical Evaluation Requesting provider: Dr. Florencia Reasons  Chief Complaint:   HPI: Very pleasant 65 year old man who was transferred to [facility] from [facility].  He developed flulike symptoms about 5 days ago, including fever, malaise, and general muscle aches.  Subsequently he developed decreased appetite, nausea and vomiting.  After about a day of the symptoms, he did develop some vague upper abdominal pain.  He presented to [facility] and was treated symptomatically but discharged home because ultrasound was apparently not available at the time.  With worsening and persistent symptoms, he returned shortly after getting home.  He underwent a CT scan there 3/17 which can be viewed in PACS, which shows mild stranding around the porta hepatis with possible gallbladder wall thickening, no calcified gallstones, unremarkable pancreas, no pancreatic ductal dilatation, no significant intrahepatic biliary dilatation.  Subsequent ultrasound on 3/18 noted sludge only within the gallbladder lumen, mild gallbladder wall thickening up to 5 mm, no sonographic Murphy sign, no gallstones.  HIDA scan performed on 3/18 demonstrated prompt uptake activity by the liver but there was no biliary excretion either to the gallbladder or the small bowel concerning for biliary stasis. His lipase on initial presentation 3/17 was 85, AST and ALT were mildly elevated to 142 and 176, and his bilirubin was 1.6 at that time. On 3/18, his lipase had increased to 3849 and his bilirubin was up to 5.3, with AST/ALT at 262/284 Yesterday, his bilirubin was 8.1, AST/ALT 274/321, and lipase down to 1055.   He denies any history of biliary symptoms or indigestion, although about a month ago he did wake up in the middle of the night with nausea and vomiting which was self resolved. He denies any previous abdominal surgery.  Allergies  Allergen Reactions  . Ciprofloxacin Other (See Comments)    PANIC ATTACKS  (Fluoroquinolones)   . Metformin Other (See Comments)    Bad acid reflux  . Buprenorphine Hcl Nausea Only  . Morphine And Related Nausea Only  . Penicillins Rash    Rash Has patient had a PCN reaction causing immediate rash, facial/tongue/throat swelling, SOB or lightheadedness with hypotension: Unknown Has patient had a PCN reaction causing severe rash involving mucus membranes or skin necrosis: Unknown Has patient had a PCN reaction that required hospitalization: No Has patient had a PCN reaction occurring within the last 10 years: No CHILDHOOD REACTION. If all of the above answers are "NO", then may proceed with Cephalosporin use.     Past Medical History:  Diagnosis Date  . Anxiety   . Arthritis   . Chronic pain   . Depression   . Heart murmur   . History of kidney stones April 12,2017   removed two kidney stones about 1 cm in size  . History of permanent cardiac pacemaker placement 05/31/2015  . Hydrocele in adult 05/07/2015  . Kidney stones   . Migraine   . MRSA carrier 2019  . Presence of permanent cardiac pacemaker   . Prostatitis   . Sleep apnea   . Sleep apnea    getting new cpap machine    Past Surgical History:  Procedure Laterality Date  . CARDIOVASCULAR STRESS TEST  2014   Dr Ubaldo Glassing  . CARPAL TUNNEL RELEASE Bilateral 2006  . CERVICAL SPINE SURGERY  2008   C5/6  . COLONOSCOPY  07/2007   prostate nodule, hemorrhoid, rpt 5 yrs [name])  . IMPLANTABLE CARDIOVERTER DEFIBRILLATOR (ICD) GENERATOR CHANGE Left 12/13/2017   Procedure: PACER CHANGE OUT;  Surgeon: Saralyn Pilar,  Sheppard Coil, MD;  Location: [facility] ORS;  Service: Cardiovascular;  Laterality: Left;  . LITHOTRIPSY Bilateral 2012   Stoioff  . LITHOTRIPSY Left 11/2015   x2 Stoioff  . pace maker revision  12/13/2017  . PACEMAKER PLACEMENT  2009   s/p Medtronic Adapta DR Pacemaker (Dr. Ubaldo Glassing)  . ROTATOR CUFF REPAIR Left 2009  . TONSILLECTOMY  1964  . ULNAR NERVE REPAIR  2008   Left    Family History   Problem Relation Age of Onset  . Heart disease Mother   . Diabetes Mother   . Arthritis Mother   . Cancer Mother        Breast Cancer  . Heart block Father   . Heart disease Father   . Arthritis Father   . Cancer Father        skin cancer    Social History   Socioeconomic History  . Marital status: Divorced    Spouse name: Not on file  . Number of children: Not on file  . Years of education: Not on file  . Highest education level: Not on file  Occupational History  . Not on file  Tobacco Use  . Smoking status: Never Smoker  . Smokeless tobacco: Never Used  Vaping Use  . Vaping Use: Never used  Substance and Sexual Activity  . Alcohol use: No  . Drug use: No  . Sexual activity: Never  Other Topics Concern  . Not on file  Social History Narrative   Divorced. Lives with wife.   Edu: 8th grade   Occ: retired, disability for chronic neck pain    Activity: no regular exercise - limited by back and knee pain   Diet: poor water, lots of tang and gatorade, no vegetables or fruits   Social Determinants of Health   Financial Resource Strain: Low Risk   . Difficulty of Paying Living Expenses: Not hard at all  Food Insecurity: No Food Insecurity  . Worried About Charity fundraiser in the Last Year: Never true  . Ran Out of Food in the Last Year: Never true  Transportation Needs: No Transportation Needs  . Lack of Transportation (Medical): No  . Lack of Transportation (Non-Medical): No  Physical Activity: Sufficiently Active  . Days of Exercise per Week: 7 days  . Minutes of Exercise per Session: 30 min  Stress: No Stress Concern Present  . Feeling of Stress : Not at all  Social Connections: Not on file    No current facility-administered medications on file prior to encounter.   Current Outpatient Medications on File Prior to Encounter  Medication Sig Dispense Refill  . Ascorbic Acid (VITAMIN C) 1000 MG tablet Take 1,000 mg by mouth daily.    Marland Kitchen aspirin 81 MG  tablet Take 81 mg by mouth daily.    Marland Kitchen atorvastatin (LIPITOR) 20 MG tablet Take 1 tablet (20 mg total) by mouth at bedtime. 90 tablet 4  . cetirizine (ZYRTEC) 10 MG tablet Take 10 mg by mouth at bedtime.     . Cholecalciferol (VITAMIN D) 2000 units CAPS Take 2,000 Units by mouth daily.     Marland Kitchen CRANBERRY PO Take 4,200 mg by mouth at bedtime.     . cyclobenzaprine (FLEXERIL) 10 MG tablet TAKE 1 TABLET BY MOUTH TWICE DAILY AS NEEDED FOR MUSCLE SPASMS GENERIC EQUIVALENT FOR FLEXERIL 40 tablet 0  . diphenhydrAMINE (BENADRYL) 50 MG capsule Take 50 mg by mouth at bedtime.    . ferrous sulfate 325 (65 FE)  MG EC tablet Take 325 mg by mouth daily with breakfast.    . FLUoxetine (PROZAC) 20 MG capsule Take 1 capsule (20 mg total) by mouth daily. (Patient taking differently: Take 20 mg by mouth at bedtime.) 90 capsule 4  . FLUoxetine (PROZAC) 40 MG capsule Take 1 capsule (40 mg total) by mouth daily. (Patient taking differently: Take 40 mg by mouth in the morning.) 90 capsule 4  . fluticasone (FLONASE) 50 MCG/ACT nasal spray Place 2 sprays into both nostrils daily. 48 g 4  . furosemide (LASIX) 20 MG tablet Take 1 tablet (20 mg total) by mouth daily. 90 tablet 4  . Garlic 9169 MG CAPS Take 1,000 mg by mouth daily.    . Melatonin 10 MG TABS Take 10 mg by mouth at bedtime.    . Multiple Vitamins-Minerals (CERTAVITE SENIOR/ANTIOXIDANT) TABS Take 1 tablet by mouth daily.    . Omega-3 1000 MG CAPS Take 1,000 mg by mouth at bedtime.    Derrill Memo ON 11/11/2020] Oxycodone HCl 10 MG TABS Take 1 tablet (10 mg total) by mouth every 6 (six) hours as needed. Must last 30 days (Patient taking differently: Take 10 mg by mouth every 6 (six) hours as needed (pain). Must last 30 days) 120 tablet 0  . potassium citrate (UROCIT-K) 10 MEQ (1080 MG) SR tablet Take 1 tablet (10 mEq total) by mouth 2 (two) times daily. 180 tablet 3  . rOPINIRole (REQUIP) 1 MG tablet Take 1 tablet (1 mg total) by mouth at bedtime. 90 tablet 4  . Saw  Palmetto, Serenoa repens, (SAW PALMETTO PO) Take 450 mg by mouth 2 (two) times daily.     Marland Kitchen VITAMIN B COMPLEX-C CAPS Take 1 capsule by mouth daily.    . vitamin E 1000 UNIT capsule Take 1,000 Units by mouth daily.     Marland Kitchen dicyclomine (BENTYL) 20 MG tablet Take 20 mg by mouth 2 (two) times daily as needed for spasms.    . naloxone (NARCAN) 4 MG/0.1ML LIQD nasal spray kit Spray into one nostril. Repeat with second device into other nostril after 2-3 minutes if no or minimal response. 2 each 0  . naproxen (NAPROSYN) 500 MG tablet Take 1 tablet (500 mg total) by mouth 2 (two) times daily as needed for moderate pain. 90 tablet 0  . omeprazole (PRILOSEC) 20 MG capsule Take 1 capsule (20 mg total) by mouth daily as needed (GERD). 90 capsule 4  . sildenafil (REVATIO) 20 MG tablet Take 1 tablet (20 mg total) by mouth 3 (three) times daily. 90 tablet 0    Review of Systems: a complete, 10pt review of systems was completed with pertinent positives and negatives as documented in the HPI  Physical Exam: Vitals:   10/24/20 0829 10/24/20 1322  BP: 128/80 (!) 143/72  Pulse: 60 60  Resp: 17 16  Temp: (!) 97.5 F (36.4 C) 98.4 F (36.9 C)  SpO2: 97% 99%   Gen: A&Ox3, no distress  Eyes: lids and conjunctivae normal, mild scleral icterus. Pupils equally round and reactive to light.  Neck: supple without mass or thyromegaly Chest: respiratory effort is normal. No crepitus or tenderness on palpation of the chest. Breath sounds equal.  Cardiovascular: RRR with palpable distal pulses, no pedal edema Gastrointestinal: soft, nondistended, nontender. Lymphatic: no lymphadenopathy in the neck or groin Muscoloskeletal: no clubbing or cyanosis of the fingers.  Strength is symmetrical throughout.  Range of motion of bilateral upper and lower extremities normal without pain, crepitation or contracture.  Neuro: cranial nerves grossly intact.  Sensation intact to light touch diffusely. Psych: appropriate mood and affect,  normal insight/judgment intact  Skin: warm and dry, mildly jaundiced   CBC Latest Ref Rng & Units 10/24/2020 09/07/2020 08/13/2019  WBC 4.0 - 10.5 K/uL 8.4 6.8 9.9  Hemoglobin 13.0 - 17.0 g/dL 13.8 15.4 15.8  Hematocrit 39.0 - 52.0 % 39.2 43.6 45.3  Platelets 150 - 400 K/uL 133(L) 162.0 172.0    CMP Latest Ref Rng & Units 10/24/2020 09/07/2020 08/13/2019  Glucose 70 - 99 mg/dL 111(H) 120(H) 140(H)  BUN 8 - 23 mg/dL 19 20 25(H)  Creatinine 0.61 - 1.24 mg/dL 0.98 0.88 1.03  Sodium 135 - 145 mmol/L 141 137 140  Potassium 3.5 - 5.1 mmol/L 4.1 4.2 3.6  Chloride 98 - 111 mmol/L 109 104 101  CO2 22 - 32 mmol/L 21(L) 29 29  Calcium 8.9 - 10.3 mg/dL 8.7(L) 9.4 9.3  Total Protein 6.5 - 8.1 g/dL 5.5(L) 6.1 6.5  Total Bilirubin 0.3 - 1.2 mg/dL 7.7(H) 0.8 1.1  Alkaline Phos 38 - 126 U/L 138(H) 56 65  AST 15 - 41 U/L 202(H) 26 39(H)  ALT 0 - 44 U/L 223(H) 29 29    Lab Results  Component Value Date   INR 1.14 12/06/2017   INR 1.0 06/03/2007    Imaging: No results found.   A/P: 65 year old man presents with 5 days of flulike prodrome followed by nausea, vomiting and abdominal pain.  He has had a CT scan, ultrasound and HIDA.  No stones on any of the imaging however there is sludge in the gallbladder.  No biliary dilatation noted however he has had elevated lipase which seems to have improved and a persistently elevated bilirubin for the last 3 days, which is at 7.7 today.  He does not have any right upper quadrant tenderness or leukocytosis.  There is some edema of the gallbladder wall on the imaging studies.   He needs further work-up regarding hyperbilirubinemia prior to consideration of cholecystectomy.  Recommend MRCP.  If this is not feasible due to his particular pacemaker and LFTs remain elevated, would recommend endoscopic evaluation by ERCP/possibly EUS as the next step.  If his bilirubin trends down significantly that would be reassuring and it would be more reasonable if that were the case  to proceed with laparoscopic cholecystectomy with intraoperative cholangiogram.   I discussed all of this with the patient and his family at the bedside. Discussed technique and risks of surgery including bleeding, pain, scarring, intraabdominal injury specifically to the common bile duct and sequelae, bile leak, conversion to open surgery, blood clot, pneumonia, heart attack, stroke, failure to resolve symptoms, etc. Questions welcomed and answered and they expressed appreciation for all the communication they have received since arriving here at [facility].    Patient Active Problem List   Diagnosis Date Noted  . Acute cholecystitis 10/23/2020  . Choledocholithiasis 10/23/2020  . Acute gallstone pancreatitis 10/23/2020  . Uncomplicated opioid dependence ([place]) 09/08/2020  . Trigger point with back pain (Left) 03/30/2020  . Trigger point of shoulder region (scapula) (Left) 03/30/2020  . Osteoarthritis of first carpometacarpal joint (thumb) of hand (Left) 03/11/2020  . Chronic thumb pain (Left) 03/03/2020  . Trigger middle finger of right hand 03/03/2020  . Chronic hand pain (Left) 03/03/2020  . Pharmacologic therapy 12/02/2019  . Disorder of skeletal system 12/02/2019  . Problems influencing health status 12/02/2019  . Cervicalgia 02/12/2019  . Trigger finger of thumb (Right) 02/12/2019  .  GERD (gastroesophageal reflux disease) 09/24/2018  . IDA (iron deficiency anemia) 09/24/2018  . Erectile dysfunction due to arterial insufficiency 09/12/2018  . Trigger finger of index finger (Right) 08/16/2018  . RLS (restless legs syndrome) 02/11/2018  . Immunization deficiency 02/11/2018  . Positive colorectal cancer screening using Cologuard test 09/21/2017  . Low testosterone in male 02/18/2017  . Chronic fatigue 02/08/2017  . Advanced care planning/counseling discussion 08/11/2016  . Chronic cervical radicular pain 06/21/2016  . Long term current use of opiate analgesic 05/15/2016  .  Obstructive sleep apnea 04/11/2016  . Hypocitraturia 03/16/2016  . Cervical foraminal stenosis (Severe C6-7) (Left) 02/02/2016  . Chronic sacroiliac joint pain (Bilateral) 02/02/2016  . Osteoarthritis of sacroiliac joint (Bilateral) 02/02/2016  . Lumbar facet hypertrophy (L1-2, L2-3, and L4-5) (Bilateral) 02/02/2016  . Lumbar IVDD (intervertebral disc displacement) 02/02/2016  . 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) 02/02/2016  . Lumbar facet syndrome (Bilateral) 02/02/2016  . Osteoarthritis of shoulder (Left) 02/02/2016  . Osteoarthritis of knee (Bilateral) (R>L) 02/02/2016  . Radicular pain of shoulder (Bilateral) (L>R) 09/01/2015  . 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 Cervicogenic headache 09/01/2015  . Cervical spondylosis with radiculopathy (Bilateral) (L>R) 09/01/2015  . Cervical facet syndrome (Bilateral) (L>R) 09/01/2015  . Opiate use (60 MME/Day) 05/31/2015  . Encounter for therapeutic drug level monitoring 05/31/2015  . Chronic neck pain (1ry area of Pain) (Bilateral) (L>R) 05/31/2015  . Chronic shoulder pain (Bilateral) (L>R) 05/31/2015  . Chronic knee pain (Bilateral) (R>L) 05/31/2015  . History of permanent cardiac pacemaker placement 05/31/2015  . Opioid-induced constipation (OIC) 05/31/2015  . Health maintenance examination 05/07/2015  . Candidal dermatitis 05/07/2015  . Bradycardia 02/16/2014  . Cardiac murmur 02/16/2014  . Severe obesity (BMI 35.0-39.9) with comorbidity ([place]) 04/22/2013  . Benign prostatic hyperplasia with urinary obstruction 05/28/2012  . Nephrolithiasis 05/28/2012  . Medicare annual wellness visit, subsequent 03/27/2012  . Hyperlipidemia 03/27/2012  . GAD (generalized anxiety disorder) 12/25/2011  . Insomnia 09/27/2011  . Chronic pain syndrome 03/28/2011  . MDD (major depressive disorder), recurrent episode, moderate ([place]) 03/28/2011       Romana Juniper, MD [facility] [place]  See AMION to contact appropriate on-call provider

## 2020-10-24 NOTE — Progress Notes (Signed)
PROGRESS NOTE    Mark [name]  VVO:[id number] DOB: 08-31-1955 DOA: 10/23/2020 PCP: [name], MD    No chief complaint on file.   Brief Narrative:  Mark [name] is a 65 y.o. male with medical history significant of  chronic pain syndrome, OSA on cpap, h/o symptomatic bradycardia s/p permanent pacemaker implantation,  Hyperlipidemia, HTN , presented to [facility] with 3 day h/o abd pain, N/V.  Symptoms were progressively worsening, severe.   found to have acute cholecystitis and possible choledocholithiasis , biliary pancreatitis Lipase went from 85 to 3849 to 1055. He has been on cefepime and flagyl, transferred to [facility] for GI consult and possible ERCP.    Subjective:  Is sitting up in chair, report abdomen is sore and feeling distended, passing gas, feeding nauseous , no vomiting Right hand swollen, think is from prior IV  Assessment & Plan:   Principal Problem:   Choledocholithiasis Active Problems:   Chronic pain syndrome   History of permanent cardiac pacemaker placement   Obstructive sleep apnea   Acute cholecystitis   Acute gallstone pancreatitis  Acute Cholecystitis with possible choledocholithiasis, biliary pancreatitis -Continue antibiotics, as needed analgesic, antiemetics -Currently on clear liquid diet -GI consulted, will follow recommendation  H/o HTN and chronic mild bilateral lower extremity edema on lasix 20mg  daily  right hand right forearm edema is new, he attribute this to prior IV, no iv moved to left side DC IV fluids Continue hold Lasix Consider venous doppler to right upper extremity to rule out DVT if edema persists  symptomatic bradycardia s/p permanent pacemaker implantation PPM was changed out in 2019 to an MRI compatible model, but looks like the original leads (placed in 2009) were used.    He followes Dr 2010, [place] [place]  Chronic pain syndrome Continue home meds oxycodone 10 mg every 6 hours as  needed  OSA, continue CPAP      Unresulted Labs (From admission, onward)          Start     Ordered   10/25/20 0500  CBC with Differential/Platelet  Tomorrow morning,   R        10/24/20 0958   10/25/20 0500  Comprehensive metabolic panel  Tomorrow morning,   R        10/24/20 0958   10/25/20 0500  Lipase, blood  Tomorrow morning,   R        10/24/20 1438            DVT prophylaxis: SCDs Start: 10/23/20 2106   Code Status: Full Family Communication: Patient Disposition:   Status is: Inpatient    Dispo: The patient is from: Home              Anticipated d/c is to: Home              Anticipated d/c date is: TBD                Consultants:   LBGI  Procedures:   none  Antimicrobials:   Cefepime/Flagyl     Objective: Vitals:   10/23/20 2032 10/24/20 0441 10/24/20 0829 10/24/20 1322  BP: (!) 142/74 132/61 128/80 (!) 143/72  Pulse: 62 60 60 60  Resp: 18 19 17 16   Temp: 98.1 F (36.7 C) 97.9 F (36.6 C) (!) 97.5 F (36.4 C) 98.4 F (36.9 C)  TempSrc: Oral Oral Oral Oral  SpO2: 100% 99% 97% 99%    Intake/Output Summary (Last 24 hours)  at 10/24/2020 1506 Last data filed at 10/24/2020 1505 Gross per 24 hour  Intake 2125.92 ml  Output 1 ml  Net 2124.92 ml   There were no vitals filed for this visit.  Examination:  General exam: calm, NAD Respiratory system: Clear to auscultation. Respiratory effort normal. Cardiovascular system: S1 & S2 heard, RRR.  Gastrointestinal system: Abdomen is slightly distended, tight, but no significant tenderness, Normal bowel sounds heard. Central nervous system: Alert and oriented. No focal neurological deficits. Extremities: Symmetric 5 x 5 power. Right arm , dorsal hand is edematous, trace bilateral lower extremity edema Skin: No rashes, lesions or ulcers Psychiatry: Judgement and insight appear normal. Mood & affect appropriate.     Data Reviewed: I have personally reviewed following labs and imaging  studies  CBC: Recent Labs  Lab 10/24/20 0049  WBC 8.4  HGB 13.8  HCT 39.2  MCV 86.9  PLT 133*    Basic Metabolic Panel: Recent Labs  Lab 10/24/20 0049  NA 141  K 4.1  CL 109  CO2 21*  GLUCOSE 111*  BUN 19  CREATININE 0.98  CALCIUM 8.7*    GFR: CrCl cannot be calculated (Unknown ideal weight.).  Liver Function Tests: Recent Labs  Lab 10/24/20 0049  AST 202*  ALT 223*  ALKPHOS 138*  BILITOT 7.7*  PROT 5.5*  ALBUMIN 2.8*    CBG: No results for input(s): GLUCAP in the last 168 hours.   Recent Results (from the past 240 hour(s))  SARS CORONAVIRUS 2 (TAT 6-24 HRS) Nasopharyngeal Nasopharyngeal Swab     Status: None   Collection Time: 10/23/20 11:08 PM   Specimen: Nasopharyngeal Swab  Result Value Ref Range Status   SARS Coronavirus 2 NEGATIVE NEGATIVE Final    Comment: (NOTE) SARS-CoV-2 target nucleic acids are NOT DETECTED.  The SARS-CoV-2 RNA is generally detectable in upper and lower respiratory specimens during the acute phase of infection. Negative results do not preclude SARS-CoV-2 infection, do not rule out co-infections with other pathogens, and should not be used as the sole basis for treatment or other patient management decisions. Negative results must be combined with clinical observations, patient history, and epidemiological information. The expected result is Negative.  Fact Sheet for Patients: HairSlick.no  Fact Sheet for Healthcare Providers: quierodirigir.com  This test is not yet approved or cleared by the [place] FDA and  has been authorized for detection and/or diagnosis of SARS-CoV-2 by FDA under an Emergency Use Authorization (EUA). This EUA will remain  in effect (meaning this test can be used) for the duration of the COVID-19 declaration under Se ction 564(b)(1) of the Act, 21 U.S.C. section 360bbb-3(b)(1), unless the authorization is terminated or revoked  sooner.  Performed at [facility], [address], [place], [place] [address]          Radiology Studies: No results found.      Scheduled Meds: . atorvastatin  20 mg Oral QHS  . FLUoxetine  20 mg Oral QHS  . FLUoxetine  40 mg Oral q AM  . melatonin  10 mg Oral QHS  . rOPINIRole  1 mg Oral QHS   Continuous Infusions: . ceFEPime (MAXIPIME) IV Stopped (10/24/20 1429)  . metronidazole Stopped (10/24/20 1501)  . promethazine (PHENERGAN) injection       LOS: 1 day   Time spent:25 mins Greater than 50% of this time was spent in counseling, explanation of diagnosis, planning of further management, and coordination of care.   Voice Recognition Reubin Milan dictation  system was used to create this note, attempts have been made to correct errors. Please contact the author with questions and/or clarifications.   Albertine Grates, MD PhD FACP [facility]  Available via Epic secure chat 7am-7pm for nonurgent issues Please page for urgent issues To page the attending provider between 7A-7P or the covering provider during after hours 7P-7A, please log into the web site [email or web address] and access using universal [place] password for that web site. If you do not have the password, please call the hospital operator.    10/24/2020, 3:06 PM

## 2020-10-25 LAB — CBC WITH DIFFERENTIAL/PLATELET
Abs Immature Granulocytes: 0.09 10*3/uL — ABNORMAL HIGH (ref 0.00–0.07)
Basophils Absolute: 0.1 10*3/uL (ref 0.0–0.1)
Basophils Relative: 1 %
Eosinophils Absolute: 0.2 10*3/uL (ref 0.0–0.5)
Eosinophils Relative: 3 %
HCT: 35.6 % — ABNORMAL LOW (ref 39.0–52.0)
Hemoglobin: 13 g/dL (ref 13.0–17.0)
Immature Granulocytes: 1 %
Lymphocytes Relative: 13 %
Lymphs Abs: 0.9 10*3/uL (ref 0.7–4.0)
MCH: 31.4 pg (ref 26.0–34.0)
MCHC: 36.5 g/dL — ABNORMAL HIGH (ref 30.0–36.0)
MCV: 86 fL (ref 80.0–100.0)
Monocytes Absolute: 0.8 10*3/uL (ref 0.1–1.0)
Monocytes Relative: 10 %
Neutro Abs: 5.5 10*3/uL (ref 1.7–7.7)
Neutrophils Relative %: 72 %
Platelets: 118 10*3/uL — ABNORMAL LOW (ref 150–400)
RBC: 4.14 MIL/uL — ABNORMAL LOW (ref 4.22–5.81)
RDW: 13.6 % (ref 11.5–15.5)
WBC: 7.5 10*3/uL (ref 4.0–10.5)
nRBC: 0 % (ref 0.0–0.2)

## 2020-10-25 LAB — COMPREHENSIVE METABOLIC PANEL
ALT: 182 U/L — ABNORMAL HIGH (ref 0–44)
AST: 189 U/L — ABNORMAL HIGH (ref 15–41)
Albumin: 2.5 g/dL — ABNORMAL LOW (ref 3.5–5.0)
Alkaline Phosphatase: 140 U/L — ABNORMAL HIGH (ref 38–126)
Anion gap: 8 (ref 5–15)
BUN: 16 mg/dL (ref 8–23)
CO2: 19 mmol/L — ABNORMAL LOW (ref 22–32)
Calcium: 8.3 mg/dL — ABNORMAL LOW (ref 8.9–10.3)
Chloride: 109 mmol/L (ref 98–111)
Creatinine, Ser: 0.88 mg/dL (ref 0.61–1.24)
GFR, Estimated: >60 mL/min (ref >60)
Glucose, Bld: 95 mg/dL (ref 70–99)
Potassium: 4.8 mmol/L (ref 3.5–5.1)
Sodium: 136 mmol/L (ref 135–145)
Total Bilirubin: 4.4 mg/dL — ABNORMAL HIGH (ref 0.3–1.2)
Total Protein: 4.7 g/dL — ABNORMAL LOW (ref 6.5–8.1)

## 2020-10-25 LAB — LIPASE, BLOOD: Lipase: 41 U/L (ref 11–51)

## 2020-10-25 MED ORDER — SODIUM CHLORIDE 0.9 % IV SOLN: INTRAVENOUS | Status: DC

## 2020-10-25 NOTE — Progress Notes (Signed)
Subjective: CC: Patient reports no current abdominal pain, nausea or vomiting.  He is tolerating clear liquids.  He notes diarrhea yesterday night.  He brought in paperwork last night for his pacemaker to see if he could undergo an MRCP.    Objective: Vital signs in last 24 hours: Temp:  [98.1 F (36.7 C)-98.4 F (36.9 C)] 98.4 F (36.9 C) (03/21 0457) Pulse Rate:  [60-62] 60 (03/21 0457) Resp:  [16-18] 18 (03/21 0457) BP: (139-144)/(67-77) 144/67 (03/21 0457) SpO2:  [95 %-99 %] 95 % (03/21 0457) Last BM Date: 10/24/20  Intake/Output from previous day: 03/20 0701 - 03/21 0700 In: 1160 [P.O.:360; IV Piggyback:800] Out: -  Intake/Output this shift: No intake/output data recorded.  PE: Gen:  Alert, NAD, pleasant HEENT: EOM's intact, pupils equal and round Card:  RRR, no M/G/R heard Pulm:  CTAB, no W/R/R, effort normal Abd: Soft, mild distension, NT, +BS Ext:  No LE edema Psych: A&Ox3  Skin: no rashes noted, warm and dry  Lab Results:  Recent Labs    10/24/20 0049 10/25/20 0442  WBC 8.4 7.5  HGB 13.8 13.0  HCT 39.2 35.6*  PLT 133* 118*   BMET Recent Labs    10/24/20 0049 10/25/20 0442  NA 141 136  K 4.1 4.8  CL 109 109  CO2 21* 19*  GLUCOSE 111* 95  BUN 19 16  CREATININE 0.98 0.88  CALCIUM 8.7* 8.3*   PT/INR No results for input(s): LABPROT, INR in the last 72 hours. CMP     Component Value Date/Time   NA 136 10/25/2020 0442   NA 139 12/09/2013 1601   K 4.8 10/25/2020 0442   K 3.6 12/09/2013 1601   CL 109 10/25/2020 0442   CL 105 12/09/2013 1601   CO2 19 (L) 10/25/2020 0442   CO2 28 12/09/2013 1601   GLUCOSE 95 10/25/2020 0442   GLUCOSE 110 (H) 12/09/2013 1601   BUN 16 10/25/2020 0442   BUN 20 (H) 12/09/2013 1601   CREATININE 0.88 10/25/2020 0442   CREATININE 1.01 12/09/2013 1601   CALCIUM 8.3 (L) 10/25/2020 0442   CALCIUM 9.0 12/09/2013 1601   PROT 4.7 (L) 10/25/2020 0442   PROT 7.2 12/09/2013 1601   ALBUMIN 2.5 (L) 10/25/2020  0442   ALBUMIN 4.2 12/09/2013 1601   AST 189 (H) 10/25/2020 0442   AST 42 (H) 12/09/2013 1601   ALT 182 (H) 10/25/2020 0442   ALT 42 12/09/2013 1601   ALKPHOS 140 (H) 10/25/2020 0442   ALKPHOS 60 12/09/2013 1601   BILITOT 4.4 (H) 10/25/2020 0442   BILITOT 0.6 12/09/2013 1601   GFRNONAA >60 10/25/2020 0442   GFRNONAA >60 12/09/2013 1601   GFRAA >60 12/06/2017 1040   GFRAA >60 12/09/2013 1601   Lipase     Component Value Date/Time   LIPASE 41 10/25/2020 0442       Studies/Results: No results found.  Anti-infectives: Anti-infectives (From admission, onward)   Start     Dose/Rate Route Frequency Ordered Stop   10/24/20 0600  ceFEPIme (MAXIPIME) 2 g in sodium chloride 0.9 % 100 mL IVPB        2 g 200 mL/hr over 30 Minutes Intravenous Every 8 hours 10/23/20 2230     10/23/20 2215  ceFEPIme (MAXIPIME) 2 g in sodium chloride 0.9 % 100 mL IVPB        2 g 200 mL/hr over 30 Minutes Intravenous  Once 10/23/20 2122 10/23/20 2230   10/23/20 2200  metroNIDAZOLE (FLAGYL) IVPB 500 mg        500 mg 100 mL/hr over 60 Minutes Intravenous Every 8 hours 10/23/20 2100         Assessment/Plan Symptomatic bradycardia s/p permanent pacemaker implantation HTN  Acute Cholecystitis  Hyperbilirubinemia w/ possible Choledocholithiasis Biliary pancreatitis - Lipase normalized (85 > 3849 > 1055 > 41) - HIDA scan performed on 3/18 demonstrated prompt uptake activity by the liver but there was no biliary excretion either to the gallbladder or the small bowel concerning for biliary stasis. - LFTs and T. Bili downtreding. WBC wnl.  T. Bili 4.4 > 7.7 > 4.4  AST 274 > 202 > 189  ALT 321> 223 > 182  Alk Phos 147 > 138 > 140 - Await GI recs to determine if pateint will undergo MRCP vs ERCP. Reassuring that LFTs are downtrending.  - Continue IV abx. We will follow along closely to determine timing of Laparoscopic Cholecystectomy after updated GI recs.   FEN - NPO, IVF per TRH VTE - SCDs, okay for  chemical prophyalxis from a general surgery standpoint ID - Cefepime/Flagyl    LOS: 2 days    Jillyn Ledger , [facility] Surgery 10/25/2020, 8:54 AM Please see Amion for pager number during day hours 7:00am-4:30pm

## 2020-10-25 NOTE — Plan of Care (Signed)
  Problem: Education: Goal: Knowledge of General Education information will improve Description: Including pain rating scale, medication(s)/side effects and non-pharmacologic comfort measures Outcome: Progressing   Problem: Health Behavior/Discharge Planning: Goal: Ability to manage health-related needs will improve Outcome: Progressing   Problem: Clinical Measurements: Goal: Ability to maintain clinical measurements within normal limits will improve Outcome: Progressing Goal: Respiratory complications will improve Outcome: Progressing   Problem: Activity: Goal: Risk for activity intolerance will decrease Outcome: Progressing   Problem: Nutrition: Goal: Adequate nutrition will be maintained Outcome: Progressing   Problem: Elimination: Goal: Will not experience complications related to bowel motility Outcome: Progressing Goal: Will not experience complications related to urinary retention Outcome: Progressing   Problem: Pain Managment: Goal: General experience of comfort will improve Outcome: Progressing   Problem: Safety: Goal: Ability to remain free from injury will improve Outcome: Progressing   Problem: Skin Integrity: Goal: Risk for impaired skin integrity will decrease Outcome: Progressing   

## 2020-10-25 NOTE — Progress Notes (Signed)
Patient refuses  CPAP. Patient does not wear at home

## 2020-10-25 NOTE — Progress Notes (Signed)
PROGRESS NOTE    Mark [name]  GQQ:[id number] DOB: 11-17-1955 DOA: 10/23/2020 PCP: [name], MD   Chief Complain: Abdominal pain  Brief Narrative: Patient is a 65 year old male with history of chronic pain syndrome, light sensitive, symptomatic bradycardia status post permanent pacemaker implantation, hyperlipidemia, hypertension who initially presented to [facility] with 3-day history of abdominal pain, nausea, vomiting.  He was found to have acute cholecystitis/possible choledocholithiasis, biliary pancreatitis with elevated lipase, liver enzymes.  Patient was transferred here for GI consult and possible ERCP.  General surgery also following.  Currently on antibiotics.  Assessment & Plan:   Principal Problem:   Choledocholithiasis Active Problems:   Chronic pain syndrome   History of permanent cardiac pacemaker placement   Obstructive sleep apnea   Acute cholecystitis   Acute gallstone pancreatitis   Acute cholecystitis/possible choledocholithiasis/biliary pancreatitis: Presented with abdominal pain, elevated liver enzymes, lipase.  Liver function is improving.  Currently on clear liquid diet, tolerating.  Continue analgesics, antiemetics, IV fluids.  GI and general surgery following.  General surgery planning for laparoscopic cholecystectomy after clearance from GI and pending decision for ERCP.  Patient has a pacemaker so could not undergo MRCP. Liver enzymes improving.  Hypertension: Currently blood pressure stable.  Monitor blood pressure.  Bilateral lower extremity edema: On Lasix 20 mg daily at home.  Also has new right upper extremity edema most likely secondary to IV cannula.  Symptomatic bradycardia: Status post permanent pacemaker implantation.  We are checking if his  pacemaker is MRI compatible.  Chronic pain syndrome: On oxycodone at home.  Hyperlipidemia: On Lipitor  History of depression: On fluoxetine  OSA: on CPAP  Thrombocytopenia: Unclear  etiology.  Continue to monitor.           DVT prophylaxis:SCD Code Status: Full Family Communication: None at bedside Status is: Inpatient  Remains inpatient appropriate because:Inpatient level of care appropriate due to severity of illness   Dispo: The patient is from: Home              Anticipated d/c is to: Home              Patient currently is not medically stable to d/c.   Difficult to place patient No    Consultants: GI,general surgery  Procedures:None  Antimicrobials:  Anti-infectives (From admission, onward)   Start     Dose/Rate Route Frequency Ordered Stop   10/24/20 0600  ceFEPIme (MAXIPIME) 2 g in sodium chloride 0.9 % 100 mL IVPB        2 g 200 mL/hr over 30 Minutes Intravenous Every 8 hours 10/23/20 2230     10/23/20 2215  ceFEPIme (MAXIPIME) 2 g in sodium chloride 0.9 % 100 mL IVPB        2 g 200 mL/hr over 30 Minutes Intravenous  Once 10/23/20 2122 10/23/20 2230   10/23/20 2200  metroNIDAZOLE (FLAGYL) IVPB 500 mg        500 mg 100 mL/hr over 60 Minutes Intravenous Every 8 hours 10/23/20 2100        Subjective: Patient seen and examined the bedside this morning.  Hemodynamically stable.  He is comfortable, sitting on the chair.  Denies any abdomen pain, nausea or vomiting but complains of abdominal bloating  Objective: Vitals:   10/24/20 0829 10/24/20 1322 10/24/20 2144 10/25/20 0457  BP: 128/80 (!) 143/72 139/77 (!) 144/67  Pulse: 60 60 62 60  Resp: 17 16 18 18   Temp: (!) 97.5 F (36.4 C) 98.4 F (36.9  C) 98.1 F (36.7 C) 98.4 F (36.9 C)  TempSrc: Oral Oral Oral Oral  SpO2: 97% 99% 97% 95%    Intake/Output Summary (Last 24 hours) at 10/25/2020 1014 Last data filed at 10/25/2020 0536 Gross per 24 hour  Intake 1160 ml  Output --  Net 1160 ml   There were no vitals filed for this visit.  Examination:  General exam: Appears calm and comfortable ,obese HEENT:PERRL,Oral mucosa moist, Ear/Nose normal on gross exam Respiratory system:  Bilateral equal air entry, normal vesicular breath sounds, no wheezes or crackles  Cardiovascular system: S1 & S2 heard, RRR. No JVD, murmurs, rubs, gallops or clicks. No pedal edema. Gastrointestinal system: Abdomen is mildly distended, soft and mostly nontender. No organomegaly or masses felt. Normal bowel sounds heard. Central nervous system: Alert and oriented. No focal neurological deficits. Extremities: Edema of the right hand, no clubbing ,no cyanosis Skin: No rashes, lesions or ulcers,no icterus ,no pallor    Data Reviewed: I have personally reviewed following labs and imaging studies  CBC: Recent Labs  Lab 10/24/20 0049 10/25/20 0442  WBC 8.4 7.5  NEUTROABS  --  5.5  HGB 13.8 13.0  HCT 39.2 35.6*  MCV 86.9 86.0  PLT 133* 118*   Basic Metabolic Panel: Recent Labs  Lab 10/24/20 0049 10/25/20 0442  NA 141 136  K 4.1 4.8  CL 109 109  CO2 21* 19*  GLUCOSE 111* 95  BUN 19 16  CREATININE 0.98 0.88  CALCIUM 8.7* 8.3*   GFR: CrCl cannot be calculated (Unknown ideal weight.). Liver Function Tests: Recent Labs  Lab 10/24/20 0049 10/25/20 0442  AST 202* 189*  ALT 223* 182*  ALKPHOS 138* 140*  BILITOT 7.7* 4.4*  PROT 5.5* 4.7*  ALBUMIN 2.8* 2.5*   Recent Labs  Lab 10/25/20 0442  LIPASE 41   No results for input(s): AMMONIA in the last 168 hours. Coagulation Profile: No results for input(s): INR, PROTIME in the last 168 hours. Cardiac Enzymes: No results for input(s): CKTOTAL, CKMB, CKMBINDEX, TROPONINI in the last 168 hours. BNP (last 3 results) No results for input(s): PROBNP in the last 8760 hours. HbA1C: No results for input(s): HGBA1C in the last 72 hours. CBG: No results for input(s): GLUCAP in the last 168 hours. Lipid Profile: No results for input(s): CHOL, HDL, LDLCALC, TRIG, CHOLHDL, LDLDIRECT in the last 72 hours. Thyroid Function Tests: No results for input(s): TSH, T4TOTAL, FREET4, T3FREE, THYROIDAB in the last 72 hours. Anemia Panel: No  results for input(s): VITAMINB12, FOLATE, FERRITIN, TIBC, IRON, RETICCTPCT in the last 72 hours. Sepsis Labs: No results for input(s): PROCALCITON, LATICACIDVEN in the last 168 hours.  Recent Results (from the past 240 hour(s))  SARS CORONAVIRUS 2 (TAT 6-24 HRS) Nasopharyngeal Nasopharyngeal Swab     Status: None   Collection Time: 10/23/20 11:08 PM   Specimen: Nasopharyngeal Swab  Result Value Ref Range Status   SARS Coronavirus 2 NEGATIVE NEGATIVE Final    Comment: (NOTE) SARS-CoV-2 target nucleic acids are NOT DETECTED.  The SARS-CoV-2 RNA is generally detectable in upper and lower respiratory specimens during the acute phase of infection. Negative results do not preclude SARS-CoV-2 infection, do not rule out co-infections with other pathogens, and should not be used as the sole basis for treatment or other patient management decisions. Negative results must be combined with clinical observations, patient history, and epidemiological information. The expected result is Negative.  Fact Sheet for Patients: HairSlick.no  Fact Sheet for Healthcare Providers: quierodirigir.com  This test  is not yet approved or cleared by the [place] and  has been authorized for detection and/or diagnosis of SARS-CoV-2 by FDA under an Emergency Use Authorization (EUA). This EUA will remain  in effect (meaning this test can be used) for the duration of the COVID-19 declaration under Se ction 564(b)(1) of the Act, 21 U.S.C. section 360bbb-3(b)(1), unless the authorization is terminated or revoked sooner.  Performed at [facility], [address] [place], [place] [address]          Radiology Studies: No results found.      Scheduled Meds: . atorvastatin  20 mg Oral QHS  . FLUoxetine  20 mg Oral QHS  . FLUoxetine  40 mg Oral q AM  . melatonin  10 mg Oral QHS  . rOPINIRole  1 mg Oral QHS   Continuous  Infusions: . ceFEPime (MAXIPIME) IV 2 g (10/25/20 0536)  . metronidazole 500 mg (10/25/20 0535)  . promethazine (PHENERGAN) injection       LOS: 2 days    Time spent: 25 mins.More than 50% of that time was spent in counseling and/or coordination of care.      Burnadette Pop, MD [facility] P3/21/2022, 10:14 AM

## 2020-10-25 NOTE — Progress Notes (Signed)
UNASSIGNED PATIENT Subjective: Mark [name] is a 26 old white male with a history of chronic pain syndrome, OSA, hyperlipidemia, migraine headaches, anxiety and depression, who was transferred from [facility] to [facility] for a possible ERCP. for 5 days prior to admission he had flulike symptoms with nausea vomiting abdominal pain.  He had an abdominal CT scan ultrasound and HIDA scan. He was noted to have gallbladder sludge with mild gallbladder wall thickening but no cholelithiasis with an ultrasound done on 10/22/2020 CT scan of the abdomen pelvis done on 10/21/2020 was suspicious for mild soft tissue stranding at the porta hepatis. HIDA scan done on 10/23/2018 showed no contrast activity in the small bowel concerning for cholecystitis.  An MRCP was not done because the patient has a pacemaker. Patient denies having any abdominal pain, nausea or vomiting today.  As per the chart review, he had a positive Cologuard in 2019 but has not had a follow-up colonoscopy which is due to be done in June of this year.  Objective: Vital signs in last 24 hours: Temp:  [98.1 F (36.7 C)-98.6 F (37 C)] 98.6 F (37 C) (03/21 1435) Pulse Rate:  [60-62] 60 (03/21 1435) Resp:  [18] 18 (03/21 1435) BP: (139-146)/(67-77) 146/73 (03/21 1435) SpO2:  [95 %-97 %] 96 % (03/21 1435) Last BM Date: 10/24/20  Intake/Output from previous day: 03/20 0701 - 03/21 0700 In: 1160 [P.O.:360; IV Piggyback:800] Out: -  Intake/Output this shift: No intake/output data recorded.  General appearance: alert, cooperative, appears stated age, no distress and mildly obese Resp: clear to auscultation bilaterally Cardio: regular rate and rhythm, S1, S2 normal, no murmur, click, rub or gallop, pacemaker present on chest GI: Obese, soft, non-tender; bowel sounds normal; no masses,  no organomegaly Extremities: extremities normal, atraumatic, no cyanosis or edema  Lab Results: Recent Labs    10/24/20 0049 10/25/20 0442   WBC 8.4 7.5  HGB 13.8 13.0  HCT 39.2 35.6*  PLT 133* 118*   BMET Recent Labs    10/24/20 0049 10/25/20 0442  NA 141 136  K 4.1 4.8  CL 109 109  CO2 21* 19*  GLUCOSE 111* 95  BUN 19 16  CREATININE 0.98 0.88  CALCIUM 8.7* 8.3*   LFT Recent Labs    10/25/20 0442  PROT 4.7*  ALBUMIN 2.5*  AST 189*  ALT 182*  ALKPHOS 140*  BILITOT 4.4*   Medications: I have reviewed the patient's current medications.  Assessment/Plan: 1) ??  Cholecystitis with possible sludge in the gallbladder with elevated liver enzymes including persistently elevated bilirubin which is 4.4 today,  down from 7.7 yesterday and mild ?biliary pancreatitis on CT scan even though the CBD is not dilated plans are to do EUS/ERCP tomorrow so that choledocholithiasis can be lowered ruled out. 2) Positive stool Cologuard with a family history of colon cancer-patient should have his surveillance colonoscopy as soon as possible. 3) OSA. 4) Hypertension. 5) Chronic pain syndrome. 6) Hyperlipidemia. 7) Anxiety & depression. 8) Pacemaker.  LOS: 2 days   Charna Elizabeth 10/25/2020, 2:52 PM

## 2020-10-26 ENCOUNTER — Inpatient Hospital Stay (HOSPITAL_COMMUNITY): Payer: Medicare Other | Admitting: Certified Registered Nurse Anesthetist

## 2020-10-26 ENCOUNTER — Inpatient Hospital Stay (HOSPITAL_COMMUNITY): Payer: Medicare Other

## 2020-10-26 ENCOUNTER — Encounter (HOSPITAL_COMMUNITY)
Admission: AD | Disposition: A | Discharge status: Home / Self Care | Payer: Self-pay | Source: Other Acute Inpatient Hospital | Attending: Internal Medicine

## 2020-10-26 ENCOUNTER — Encounter (HOSPITAL_COMMUNITY): Payer: Self-pay | Admitting: Internal Medicine

## 2020-10-26 HISTORY — PX: ERCP: SHX5425

## 2020-10-26 HISTORY — PX: EUS: SHX5427

## 2020-10-26 HISTORY — PX: ESOPHAGOGASTRODUODENOSCOPY (EGD) WITH PROPOFOL: SHX5813

## 2020-10-26 HISTORY — PX: SPHINCTEROTOMY: SHX5279

## 2020-10-26 HISTORY — PX: REMOVAL OF STONES: SHX5545

## 2020-10-26 LAB — SARS CORONAVIRUS 2 (TAT 6-24 HRS): SARS Coronavirus 2: NEGATIVE

## 2020-10-26 LAB — CBC WITH DIFFERENTIAL/PLATELET
Abs Immature Granulocytes: 0.26 10*3/uL — ABNORMAL HIGH (ref 0.00–0.07)
Basophils Absolute: 0.1 10*3/uL (ref 0.0–0.1)
Basophils Relative: 1 %
Eosinophils Absolute: 0.2 10*3/uL (ref 0.0–0.5)
Eosinophils Relative: 2 %
HCT: 38.7 % — ABNORMAL LOW (ref 39.0–52.0)
Hemoglobin: 14.1 g/dL (ref 13.0–17.0)
Immature Granulocytes: 3 %
Lymphocytes Relative: 16 %
Lymphs Abs: 1.5 10*3/uL (ref 0.7–4.0)
MCH: 31.5 pg (ref 26.0–34.0)
MCHC: 36.4 g/dL — ABNORMAL HIGH (ref 30.0–36.0)
MCV: 86.4 fL (ref 80.0–100.0)
Monocytes Absolute: 1 10*3/uL (ref 0.1–1.0)
Monocytes Relative: 10 %
Neutro Abs: 6.2 10*3/uL (ref 1.7–7.7)
Neutrophils Relative %: 68 %
Platelets: 135 10*3/uL — ABNORMAL LOW (ref 150–400)
RBC: 4.48 MIL/uL (ref 4.22–5.81)
RDW: 13.6 % (ref 11.5–15.5)
WBC: 9.2 10*3/uL (ref 4.0–10.5)
nRBC: 0 % (ref 0.0–0.2)

## 2020-10-26 LAB — COMPREHENSIVE METABOLIC PANEL
ALT: 167 U/L — ABNORMAL HIGH (ref 0–44)
AST: 133 U/L — ABNORMAL HIGH (ref 15–41)
Albumin: 2.6 g/dL — ABNORMAL LOW (ref 3.5–5.0)
Alkaline Phosphatase: 143 U/L — ABNORMAL HIGH (ref 38–126)
Anion gap: 8 (ref 5–15)
BUN: 15 mg/dL (ref 8–23)
CO2: 23 mmol/L (ref 22–32)
Calcium: 8.7 mg/dL — ABNORMAL LOW (ref 8.9–10.3)
Chloride: 107 mmol/L (ref 98–111)
Creatinine, Ser: 0.98 mg/dL (ref 0.61–1.24)
GFR, Estimated: >60 mL/min (ref >60)
Glucose, Bld: 83 mg/dL (ref 70–99)
Potassium: 3.6 mmol/L (ref 3.5–5.1)
Sodium: 138 mmol/L (ref 135–145)
Total Bilirubin: 2.8 mg/dL — ABNORMAL HIGH (ref 0.3–1.2)
Total Protein: 5.3 g/dL — ABNORMAL LOW (ref 6.5–8.1)

## 2020-10-26 LAB — SURGICAL PCR SCREEN
MRSA, PCR: POSITIVE — AB
Staphylococcus aureus: POSITIVE — AB

## 2020-10-26 SURGERY — ERCP, WITH INTERVENTION IF INDICATED
Anesthesia: General

## 2020-10-26 MED ORDER — MUPIROCIN 2 % EX OINT
1.0000 "application " | TOPICAL_OINTMENT | Freq: Two times a day (BID) | CUTANEOUS | Status: DC
  Administered 2020-10-26 – 2020-10-29 (×6): 1 via NASAL
  Filled 2020-10-26: qty 22

## 2020-10-26 MED ORDER — MIDAZOLAM HCL 5 MG/5ML IJ SOLN
INTRAMUSCULAR | Status: DC | PRN
  Administered 2020-10-26: 2 mg via INTRAVENOUS

## 2020-10-26 MED ORDER — SODIUM CHLORIDE 0.9 % IV SOLN
2.0000 g | INTRAVENOUS | Status: DC
  Administered 2020-10-27: 2 g via INTRAVENOUS
  Filled 2020-10-26: qty 2
  Filled 2020-10-26: qty 20

## 2020-10-26 MED ORDER — DEXAMETHASONE SODIUM PHOSPHATE 10 MG/ML IJ SOLN
INTRAMUSCULAR | Status: DC | PRN
  Administered 2020-10-26: 4 mg via INTRAVENOUS

## 2020-10-26 MED ORDER — ROCURONIUM BROMIDE 10 MG/ML (PF) SYRINGE
PREFILLED_SYRINGE | INTRAVENOUS | Status: DC | PRN
  Administered 2020-10-26: 50 mg via INTRAVENOUS

## 2020-10-26 MED ORDER — SODIUM CHLORIDE 0.9 % IV SOLN
12.5000 mg | Freq: Four times a day (QID) | INTRAVENOUS | Status: DC | PRN
  Filled 2020-10-26: qty 0.5

## 2020-10-26 MED ORDER — PHENYLEPHRINE 40 MCG/ML (10ML) SYRINGE FOR IV PUSH (FOR BLOOD PRESSURE SUPPORT)
PREFILLED_SYRINGE | INTRAVENOUS | Status: DC | PRN
  Administered 2020-10-26: 120 ug via INTRAVENOUS

## 2020-10-26 MED ORDER — ONDANSETRON HCL 4 MG/2ML IJ SOLN
INTRAMUSCULAR | Status: DC | PRN
  Administered 2020-10-26: 4 mg via INTRAVENOUS

## 2020-10-26 MED ORDER — MIDAZOLAM HCL 2 MG/2ML IJ SOLN
INTRAMUSCULAR | Status: AC
  Filled 2020-10-26: qty 2

## 2020-10-26 MED ORDER — CIPROFLOXACIN IN D5W 400 MG/200ML IV SOLN
INTRAVENOUS | Status: AC
  Filled 2020-10-26: qty 200

## 2020-10-26 MED ORDER — INDOMETHACIN 50 MG RE SUPP
RECTAL | Status: AC
  Filled 2020-10-26: qty 2

## 2020-10-26 MED ORDER — CHLORHEXIDINE GLUCONATE CLOTH 2 % EX PADS
6.0000 | MEDICATED_PAD | Freq: Every day | CUTANEOUS | Status: DC
  Administered 2020-10-26 – 2020-10-29 (×3): 6 via TOPICAL

## 2020-10-26 MED ORDER — FENTANYL CITRATE (PF) 100 MCG/2ML IJ SOLN
INTRAMUSCULAR | Status: AC
  Filled 2020-10-26: qty 2

## 2020-10-26 MED ORDER — LACTATED RINGERS IV SOLN: INTRAVENOUS | Status: DC

## 2020-10-26 MED ORDER — SUGAMMADEX SODIUM 200 MG/2ML IV SOLN
INTRAVENOUS | Status: DC | PRN
  Administered 2020-10-26: 400 mg via INTRAVENOUS

## 2020-10-26 MED ORDER — GLUCAGON HCL RDNA (DIAGNOSTIC) 1 MG IJ SOLR
INTRAMUSCULAR | Status: AC
  Filled 2020-10-26: qty 1

## 2020-10-26 MED ORDER — INDOMETHACIN 50 MG RE SUPP
RECTAL | Status: DC | PRN
  Administered 2020-10-26: 100 mg via RECTAL

## 2020-10-26 MED ORDER — FENTANYL CITRATE (PF) 250 MCG/5ML IJ SOLN
INTRAMUSCULAR | Status: DC | PRN
  Administered 2020-10-26: 100 ug via INTRAVENOUS

## 2020-10-26 MED ORDER — PROPOFOL 10 MG/ML IV BOLUS
INTRAVENOUS | Status: DC | PRN
  Administered 2020-10-26: 50 mg via INTRAVENOUS
  Administered 2020-10-26: 150 mg via INTRAVENOUS

## 2020-10-26 MED ORDER — LIDOCAINE 2% (20 MG/ML) 5 ML SYRINGE
INTRAMUSCULAR | Status: DC | PRN
  Administered 2020-10-26: 60 mg via INTRAVENOUS

## 2020-10-26 MED ORDER — SODIUM CHLORIDE 0.9 % IV SOLN
INTRAVENOUS | Status: DC | PRN
  Administered 2020-10-26: 15 mL

## 2020-10-26 NOTE — Progress Notes (Signed)
PROGRESS NOTE    Mark [name]  OHY:[id number] DOB: 07-25-1956 DOA: 10/23/2020 PCP: [name], MD   Chief Complain: Abdominal pain  Brief Narrative: Patient is a 65 year old male with history of chronic pain syndrome, light sensitive, symptomatic bradycardia status post permanent pacemaker implantation, hyperlipidemia, hypertension who initially presented to [facility] with 3-day history of abdominal pain, nausea, vomiting.  He was found to have acute cholecystitis/possible choledocholithiasis, biliary pancreatitis with elevated lipase, liver enzymes.  Patient was transferred here for GI consult and possible ERCP.  General surgery also following.  Currently on antibiotics.Plan for ERCP today and lap chole tomorrow  Assessment & Plan:   Principal Problem:   Choledocholithiasis Active Problems:   Chronic pain syndrome   History of permanent cardiac pacemaker placement   Obstructive sleep apnea   Acute cholecystitis   Acute gallstone pancreatitis   Acute cholecystitis/possible choledocholithiasis/biliary pancreatitis: Presented with abdominal pain, elevated liver enzymes, lipase.  Liver function is improving. Continue analgesics, antiemetics, IV fluids.  GI and general surgery following.  General surgery planning for laparoscopic cholecystectomy tomorrow.  Patient has a pacemaker so could not undergo MRCP but looks like its MRI compatible. Plan for ERCP today  Hypertension: Currently blood pressure stable.  Monitor blood pressure.  Bilateral lower extremity edema: On Lasix 20 mg daily at home.  Also has new right upper extremity edema most likely secondary to IV cannula.  Symptomatic bradycardia: Status post permanent pacemaker implantation.   Chronic pain syndrome: On oxycodone at home.  Hyperlipidemia: On Lipitor  History of depression: On fluoxetine  OSA: on CPAP  Thrombocytopenia: Unclear etiology.  Continue to monitor.  Positive stool Cologuard: Has family  history of colon cancer.  GI recommended surveillance colonoscopy .           DVT prophylaxis:SCD Code Status: Full Family Communication: None at bedside Status is: Inpatient  Remains inpatient appropriate because:Inpatient level of care appropriate due to severity of illness   Dispo: The patient is from: Home              Anticipated d/c is to: Home              Patient currently is not medically stable to d/c.   Difficult to place patient No    Consultants: GI,general surgery  Procedures:None  Antimicrobials:  Anti-infectives (From admission, onward)   Start     Dose/Rate Route Frequency Ordered Stop   10/26/20 0845  cefTRIAXone (ROCEPHIN) 1 g in sodium chloride 0.9 % 100 mL IVPB        1 g 200 mL/hr over 30 Minutes Intravenous Every 24 hours 10/26/20 0750     10/24/20 0600  ceFEPIme (MAXIPIME) 2 g in sodium chloride 0.9 % 100 mL IVPB  Status:  Discontinued        2 g 200 mL/hr over 30 Minutes Intravenous Every 8 hours 10/23/20 2230 10/26/20 0752   10/23/20 2215  ceFEPIme (MAXIPIME) 2 g in sodium chloride 0.9 % 100 mL IVPB        2 g 200 mL/hr over 30 Minutes Intravenous  Once 10/23/20 2122 10/23/20 2230   10/23/20 2200  metroNIDAZOLE (FLAGYL) IVPB 500 mg        500 mg 100 mL/hr over 60 Minutes Intravenous Every 8 hours 10/23/20 2100        Subjective: Patient seen and examined the bedside this morning.  Hemodynamically stable.  Overall comfortable.  He reported several episodes of bowel movement which were loose.  Denies any  abdominal pain, nausea or vomiting today.  Objective: Vitals:   10/25/20 0457 10/25/20 1435 10/25/20 2135 10/26/20 0532  BP: (!) 144/67 (!) 146/73 (!) 154/68 (!) 145/66  Pulse: 60 60 60 60  Resp: 18 18 18    Temp: 98.4 F (36.9 C) 98.6 F (37 C) 98.1 F (36.7 C) 98 F (36.7 C)  TempSrc: Oral Oral Oral Oral  SpO2: 95% 96% 97% 98%    Intake/Output Summary (Last 24 hours) at 10/26/2020 0753 Last data filed at 10/25/2020 1800 Gross  per 24 hour  Intake 760 ml  Output --  Net 760 ml   There were no vitals filed for this visit.  Examination:  General exam: Overall comfortable, not in distress HEENT: PERRL Respiratory system:  no wheezes or crackles  Cardiovascular system: S1 & S2 heard, RRR.  Gastrointestinal system: Abdomen is nondistended, soft and nontender. Central nervous system: Alert and oriented Extremities: No edema, no clubbing ,no cyanosis Skin: No rashes, no ulcers,no icterus     Data Reviewed: I have personally reviewed following labs and imaging studies  CBC: Recent Labs  Lab 10/24/20 0049 10/25/20 0442 10/26/20 0219  WBC 8.4 7.5 9.2  NEUTROABS  --  5.5 6.2  HGB 13.8 13.0 14.1  HCT 39.2 35.6* 38.7*  MCV 86.9 86.0 86.4  PLT 133* 118* 135*   Basic Metabolic Panel: Recent Labs  Lab 10/24/20 0049 10/25/20 0442 10/26/20 0219  NA 141 136 138  K 4.1 4.8 3.6  CL 109 109 107  CO2 21* 19* 23  GLUCOSE 111* 95 83  BUN 19 16 15   CREATININE 0.98 0.88 0.98  CALCIUM 8.7* 8.3* 8.7*   GFR: CrCl cannot be calculated (Unknown ideal weight.). Liver Function Tests: Recent Labs  Lab 10/24/20 0049 10/25/20 0442 10/26/20 0219  AST 202* 189* 133*  ALT 223* 182* 167*  ALKPHOS 138* 140* 143*  BILITOT 7.7* 4.4* 2.8*  PROT 5.5* 4.7* 5.3*  ALBUMIN 2.8* 2.5* 2.6*   Recent Labs  Lab 10/25/20 0442  LIPASE 41   No results for input(s): AMMONIA in the last 168 hours. Coagulation Profile: No results for input(s): INR, PROTIME in the last 168 hours. Cardiac Enzymes: No results for input(s): CKTOTAL, CKMB, CKMBINDEX, TROPONINI in the last 168 hours. BNP (last 3 results) No results for input(s): PROBNP in the last 8760 hours. HbA1C: No results for input(s): HGBA1C in the last 72 hours. CBG: No results for input(s): GLUCAP in the last 168 hours. Lipid Profile: No results for input(s): CHOL, HDL, LDLCALC, TRIG, CHOLHDL, LDLDIRECT in the last 72 hours. Thyroid Function Tests: No results for  input(s): TSH, T4TOTAL, FREET4, T3FREE, THYROIDAB in the last 72 hours. Anemia Panel: No results for input(s): VITAMINB12, FOLATE, FERRITIN, TIBC, IRON, RETICCTPCT in the last 72 hours. Sepsis Labs: No results for input(s): PROCALCITON, LATICACIDVEN in the last 168 hours.  Recent Results (from the past 240 hour(s))  SARS CORONAVIRUS 2 (TAT 6-24 HRS) Nasopharyngeal Nasopharyngeal Swab     Status: None   Collection Time: 10/23/20 11:08 PM   Specimen: Nasopharyngeal Swab  Result Value Ref Range Status   SARS Coronavirus 2 NEGATIVE NEGATIVE Final    Comment: (NOTE) SARS-CoV-2 target nucleic acids are NOT DETECTED.  The SARS-CoV-2 RNA is generally detectable in upper and lower respiratory specimens during the acute phase of infection. Negative results do not preclude SARS-CoV-2 infection, do not rule out co-infections with other pathogens, and should not be used as the sole basis for treatment or other patient management decisions.  Negative results must be combined with clinical observations, patient history, and epidemiological information. The expected result is Negative.  Fact Sheet for Patients: HairSlick.no  Fact Sheet for Healthcare Providers: quierodirigir.com  This test is not yet approved or cleared by the [place] FDA and  has been authorized for detection and/or diagnosis of SARS-CoV-2 by FDA under an Emergency Use Authorization (EUA). This EUA will remain  in effect (meaning this test can be used) for the duration of the COVID-19 declaration under Se ction 564(b)(1) of the Act, 21 U.S.C. section 360bbb-3(b)(1), unless the authorization is terminated or revoked sooner.  Performed at [facility], [address], [place], [place] [address]          Radiology Studies: No results found.      Scheduled Meds: . atorvastatin  20 mg Oral QHS  . FLUoxetine  20 mg Oral QHS  . FLUoxetine  40 mg Oral q  AM  . melatonin  10 mg Oral QHS  . rOPINIRole  1 mg Oral QHS   Continuous Infusions: . sodium chloride 20 mL/hr at 10/26/20 0649  . cefTRIAXone (ROCEPHIN)  IV    . metronidazole 500 mg (10/26/20 0651)  . promethazine (PHENERGAN) injection       LOS: 3 days    Time spent: 25 mins.More than 50% of that time was spent in counseling and/or coordination of care.      Burnadette Pop, MD [facility] P3/22/2022, 7:53 AM

## 2020-10-26 NOTE — Anesthesia Postprocedure Evaluation (Signed)
Anesthesia Post Note  Patient: Mark [name]  Procedure(s) Performed: ENDOSCOPIC RETROGRADE CHOLANGIOPANCREATOGRAPHY (ERCP) (N/A ) ESOPHAGOGASTRODUODENOSCOPY (EGD) WITH PROPOFOL (N/A ) UPPER ENDOSCOPIC ULTRASOUND (EUS) LINEAR SPHINCTEROTOMY REMOVAL OF STONES     Patient location during evaluation: Endoscopy Anesthesia Type: General Level of consciousness: awake and alert Pain management: pain level controlled Vital Signs Assessment: post-procedure vital signs reviewed and stable Respiratory status: spontaneous breathing, nonlabored ventilation and respiratory function stable Cardiovascular status: blood pressure returned to baseline and stable Postop Assessment: no apparent nausea or vomiting Anesthetic complications: no   No complications documented.  Last Vitals:  Vitals:   10/26/20 1506 10/26/20 1516  BP: (!) 162/68 (!) 165/74  Pulse: 60 (!) 59  Resp: 17 12  Temp:    SpO2: 99% 98%    Last Pain:  Vitals:   10/26/20 1516  TempSrc:   PainSc: 0-No pain                 Deron Poole,W. EDMOND

## 2020-10-26 NOTE — Op Note (Signed)
[facility] Patient Name: Mark [name] Procedure Date : 10/26/2020 MRN: [id number] Attending MD: Jeani Hawking , MD Date of Birth: 09/02/1955 CSN: [id number] Age: 65 Admit Type: Inpatient Procedure:                ERCP Indications:              Common bile duct stone(s) Providers:                Jeani Hawking, MD, Roselie Awkward, RN, Leanne Lovely, Technician Referring MD:              Medicines:                General Anesthesia Complications:            No immediate complications. Estimated Blood Loss:     Estimated blood loss: none. Procedure:                Pre-Anesthesia Assessment:                           - Prior to the procedure, a History and Physical                            was performed, and patient medications and                            allergies were reviewed. The patient's tolerance of                            previous anesthesia was also reviewed. The risks                            and benefits of the procedure and the sedation                            options and risks were discussed with the patient.                            All questions were answered, and informed consent                            was obtained. Prior Anticoagulants: The patient has                            taken no previous anticoagulant or antiplatelet                            agents. ASA Grade Assessment: II - A patient with                            mild systemic disease. After reviewing the risks                            and  benefits, the patient was deemed in                            satisfactory condition to undergo the procedure.                           - Sedation was administered by an anesthesia                            professional. General anesthesia was attained.                           After obtaining informed consent, the scope was                            passed under direct vision. Throughout the                             procedure, the patient's blood pressure, pulse, and                            oxygen saturations were monitored continuously. The                            [id number]-Q180V ([id number]) Olympus Duodensocope was                            introduced through the mouth, and used to inject                            contrast into and used to inject contrast into the                            bile duct. The ERCP was accomplished without                            difficulty. The patient tolerated the procedure                            well. Scope In: Scope Out: Findings:      The major papilla was normal. A short 0.035 inch Soft Jagwire was passed       into the biliary tree. A 10 mm biliary sphincterotomy was made with a       traction (standard) sphincterotome using ERBE electrocautery. There was       no post-sphincterotomy bleeding. The biliary tree was swept with a 12 mm       balloon starting at the bifurcation. Sludge was swept from the duct.      The CBD was easily cannulated and the guidewire was secured in the right       intrahepatic ducts. Contrast injection revealed a minimally dilated CBD       at 6-7 mm. There was a slightly greater amount of dilation in the distal       CBD. A 1 cm sphincterotomy was created and there was spontaneous flow of  some sludge. The CBD was swept with teh 12 mm balloon and more sludge       was removed. After several sweeps the final occlusion cholangiogram was       negative for any retained stones. In the duodenal bulb and D2 there was       evidence of some moderately-sized erosions. There was also evidence of       some eschar with some of the erosions. Friability around the mucosal       edge was noted. Impression:               - The major papilla appeared normal.                           - A biliary sphincterotomy was performed.                           - The biliary tree was swept and sludge was found. Recommendation:            - Return patient to hospital ward for ongoing care.                           - Resume regular diet.                           - Lap chole per Surgery. Procedure Code(s):        --- Professional ---                           [phone], Endoscopic retrograde                            cholangiopancreatography (ERCP); with removal of                            calculi/debris from biliary/pancreatic duct(s)                           [id number], Endoscopic retrograde                            cholangiopancreatography (ERCP); with                            sphincterotomy/papillotomy Diagnosis Code(s):        --- Professional ---                           K80.50, Calculus of bile duct without cholangitis                            or cholecystitis without obstruction CPT copyright 2019 American Medical Association. All rights reserved. The codes documented in this report are preliminary and upon coder review may  be revised to meet current compliance requirements. Jeani Hawking, MD Jeani Hawking, MD 10/26/2020 3:03:13 PM This report has been signed electronically. Number of Addenda: 0

## 2020-10-26 NOTE — Plan of Care (Signed)
  Problem: Education: Goal: Knowledge of General Education information will improve Description Including pain rating scale, medication(s)/side effects and non-pharmacologic comfort measures Outcome: Progressing   

## 2020-10-26 NOTE — Transfer of Care (Signed)
Immediate Anesthesia Transfer of Care Note  Patient: Mark [name]  Procedure(s) Performed: ENDOSCOPIC RETROGRADE CHOLANGIOPANCREATOGRAPHY (ERCP) (N/A ) ESOPHAGOGASTRODUODENOSCOPY (EGD) WITH PROPOFOL (N/A ) UPPER ENDOSCOPIC ULTRASOUND (EUS) LINEAR SPHINCTEROTOMY REMOVAL OF STONES  Patient Location: PACU and Endoscopy Unit  Anesthesia Type:General  Level of Consciousness: awake and patient cooperative  Airway & Oxygen Therapy: Patient Spontanous Breathing  Post-op Assessment: Report given to RN and Post -op Vital signs reviewed and stable  Post vital signs: Reviewed and stable  Last Vitals:  Vitals Value Taken Time  BP    Temp    Pulse    Resp    SpO2      Last Pain:  Vitals:   10/26/20 1254  TempSrc: Oral  PainSc: 0-No pain      Patients Stated Pain Goal: 1 (10/25/20 2207)  Complications: No complications documented.

## 2020-10-26 NOTE — Anesthesia Preprocedure Evaluation (Signed)
Anesthesia Evaluation  Patient identified by MRN, date of birth, ID band Patient awake    Reviewed: Allergy & Precautions, H&P , NPO status , Patient's Chart, lab work & pertinent test results  Airway Mallampati: II   Neck ROM: full    Dental   Pulmonary sleep apnea ,    breath sounds clear to auscultation       Cardiovascular + pacemaker + Cardiac Defibrillator  Rhythm:regular Rate:Normal  TTE (2019): EF 55%   Neuro/Psych  Headaches, PSYCHIATRIC DISORDERS Anxiety Depression    GI/Hepatic GERD  ,  Endo/Other    Renal/GU      Musculoskeletal  (+) Arthritis ,   Abdominal   Peds  Hematology   Anesthesia Other Findings   Reproductive/Obstetrics                             Anesthesia Physical Anesthesia Plan  ASA: III  Anesthesia Plan: General   Post-op Pain Management:    Induction: Intravenous  PONV Risk Score and Plan: 2 and Ondansetron, Dexamethasone, Midazolam and Treatment may vary due to age or medical condition  Airway Management Planned: Oral ETT  Additional Equipment:   Intra-op Plan:   Post-operative Plan: Extubation in OR  Informed Consent: I have reviewed the patients History and Physical, chart, labs and discussed the procedure including the risks, benefits and alternatives for the proposed anesthesia with the patient or authorized representative who has indicated his/her understanding and acceptance.     Dental advisory given  Plan Discussed with: CRNA, Anesthesiologist and Surgeon  Anesthesia Plan Comments:         Anesthesia Quick Evaluation

## 2020-10-26 NOTE — Anesthesia Procedure Notes (Signed)
Procedure Name: Intubation Date/Time: 10/26/2020 2:01 PM Performed by: Adria Dill, CRNA Pre-anesthesia Checklist: Patient identified, Emergency Drugs available, Suction available and Patient being monitored Patient Re-evaluated:Patient Re-evaluated prior to induction Oxygen Delivery Method: Circle system utilized Preoxygenation: Pre-oxygenation with 100% oxygen Induction Type: IV induction Ventilation: Mask ventilation without difficulty Laryngoscope Size: Glidescope and 4 Grade View: Grade I Tube type: Oral Tube size: 7.5 mm Number of attempts: 1 Airway Equipment and Method: Oral airway,  Video-laryngoscopy and Rigid stylet Placement Confirmation: ETT inserted through vocal cords under direct vision,  positive ETCO2 and breath sounds checked- equal and bilateral Secured at: 21 cm Tube secured with: Tape Dental Injury: Teeth and Oropharynx as per pre-operative assessment  Difficulty Due To: Difficulty was anticipated, Difficult Airway- due to reduced neck mobility and Difficult Airway- due to limited oral opening Future Recommendations: Recommend- induction with short-acting agent, and alternative techniques readily available

## 2020-10-26 NOTE — Op Note (Signed)
[facility] Patient Name: Mark HalimDennis [name] Procedure Date : 10/26/2020 MRN: [id number] Attending MD: Jeani HawkingPatrick Gaynor Genco , MD Date of Birth: 07/26/1956 CSN: [id number] Age: 65 Admit Type: Inpatient Procedure:                Upper EUS Indications:              Abnormal abdominal/pelvic CT scan Providers:                Jeani HawkingPatrick Tabbetha Kutscher, MD, Roselie AwkwardShannon Love, RN, Leanne LovelyAllison                            Townsend, Technician Referring MD:              Medicines:                General Anesthesia Complications:            No immediate complications. Estimated Blood Loss:     Estimated blood loss: none. Procedure:                Pre-Anesthesia Assessment:                           - Prior to the procedure, a History and Physical                            was performed, and patient medications and                            allergies were reviewed. The patient's tolerance of                            previous anesthesia was also reviewed. The risks                            and benefits of the procedure and the sedation                            options and risks were discussed with the patient.                            All questions were answered, and informed consent                            was obtained. Prior Anticoagulants: The patient has                            taken no previous anticoagulant or antiplatelet                            agents. ASA Grade Assessment: II - A patient with                            mild systemic disease. After reviewing the risks  and benefits, the patient was deemed in                            satisfactory condition to undergo the procedure.                           - Sedation was administered by an anesthesia                            professional. General anesthesia was attained.                           After obtaining informed consent, the endoscope was                            passed under direct vision. Throughout  the                            procedure, the patient's blood pressure, pulse, and                            oxygen saturations were monitored continuously. The                            [id number] ([id number]) Olympus Linear EUS scope was                            introduced through the mouth, and advanced to the                            second part of duodenum. The upper EUS was                            accomplished without difficulty. The patient                            tolerated the procedure well. Scope In: Scope Out: Findings:      ENDOSCOPIC FINDING: :      The examined esophagus was endoscopically normal.      The entire examined stomach was endoscopically normal.      Multiple diffuse erosions with bleeding on contact were found in the       duodenal bulb and in the second portion of the duodenum.      ENDOSONOGRAPHIC FINDING: :      Moderate hyperechoic material consistent with sludge was visualized       endosonographically in the common bile duct.      There was no sign of significant endosonographic abnormality in the       pancreatic head, genu of the pancreas, pancreatic body and pancreatic       tail. The pancreatic duct measured up to 1 mm in diameter. Impression:               - Normal esophagus.                           -  Normal stomach.                           - Duodenal erosions with bleeding on contact.                           - Hyperechoic material consistent with sludge was                            visualized endosonographically in the common bile                            duct.                           - There was no sign of significant pathology in the                            pancreatic head, genu of the pancreas, pancreatic                            body and pancreatic tail.                           - No specimens collected. Recommendation:           - Proceed with the ERCP with sludge/stone                             extraction. Procedure Code(s):        --- Professional ---                           [phone], Esophagogastroduodenoscopy, flexible,                            transoral; with endoscopic ultrasound examination                            limited to the esophagus, stomach or duodenum, and                            adjacent structures Diagnosis Code(s):        --- Professional ---                           K26.4, Chronic or unspecified duodenal ulcer with                            hemorrhage                           K83.8, Other specified diseases of biliary tract                           R93.5, Abnormal findings on diagnostic imaging of  other abdominal regions, including retroperitoneum CPT copyright 2019 American Medical Association. All rights reserved. The codes documented in this report are preliminary and upon coder review may  be revised to meet current compliance requirements. Jeani Hawking, MD Jeani Hawking, MD 10/26/2020 3:07:40 PM This report has been signed electronically. Number of Addenda: 0

## 2020-10-26 NOTE — Progress Notes (Signed)
Subjective: CC: Reports while his pacemaker is MRI compatible, it was found the leads for his pacemaker were not. GI planing EUS/ERCP today. He denies any RUQ or epigastric abdominal pain today. Has some bloating and "hunger pains" he reports. No n/v.   Objective: Vital signs in last 24 hours: Temp:  [98 F (36.7 C)-98.6 F (37 C)] 98 F (36.7 C) (03/22 0532) Pulse Rate:  [60] 60 (03/22 0532) Resp:  [18] 18 (03/21 2135) BP: (145-154)/(66-73) 145/66 (03/22 0532) SpO2:  [96 %-98 %] 98 % (03/22 0532) Last BM Date: 10/24/20  Intake/Output from previous day: 03/21 0701 - 03/22 0700 In: 760 [P.O.:360; IV Piggyback:400] Out: -  Intake/Output this shift: No intake/output data recorded.  PE: Gen:  Alert, NAD, pleasant HEENT: EOM's intact, pupils equal and round Card:  RRR, no M/G/R heard Pulm:  CTAB, no W/R/R, effort normal Abd: Soft, mild distension, NT, +BS Ext:  No LE edema Psych: A&Ox3  Skin: no rashes noted, warm and dry  Lab Results:  Recent Labs    10/25/20 0442 10/26/20 0219  WBC 7.5 9.2  HGB 13.0 14.1  HCT 35.6* 38.7*  PLT 118* 135*   BMET Recent Labs    10/25/20 0442 10/26/20 0219  NA 136 138  K 4.8 3.6  CL 109 107  CO2 19* 23  GLUCOSE 95 83  BUN 16 15  CREATININE 0.88 0.98  CALCIUM 8.3* 8.7*   PT/INR No results for input(s): LABPROT, INR in the last 72 hours. CMP     Component Value Date/Time   NA 138 10/26/2020 0219   NA 139 12/09/2013 1601   K 3.6 10/26/2020 0219   K 3.6 12/09/2013 1601   CL 107 10/26/2020 0219   CL 105 12/09/2013 1601   CO2 23 10/26/2020 0219   CO2 28 12/09/2013 1601   GLUCOSE 83 10/26/2020 0219   GLUCOSE 110 (H) 12/09/2013 1601   BUN 15 10/26/2020 0219   BUN 20 (H) 12/09/2013 1601   CREATININE 0.98 10/26/2020 0219   CREATININE 1.01 12/09/2013 1601   CALCIUM 8.7 (L) 10/26/2020 0219   CALCIUM 9.0 12/09/2013 1601   PROT 5.3 (L) 10/26/2020 0219   PROT 7.2 12/09/2013 1601   ALBUMIN 2.6 (L) 10/26/2020 0219    ALBUMIN 4.2 12/09/2013 1601   AST 133 (H) 10/26/2020 0219   AST 42 (H) 12/09/2013 1601   ALT 167 (H) 10/26/2020 0219   ALT 42 12/09/2013 1601   ALKPHOS 143 (H) 10/26/2020 0219   ALKPHOS 60 12/09/2013 1601   BILITOT 2.8 (H) 10/26/2020 0219   BILITOT 0.6 12/09/2013 1601   GFRNONAA >60 10/26/2020 0219   GFRNONAA >60 12/09/2013 1601   GFRAA >60 12/06/2017 1040   GFRAA >60 12/09/2013 1601   Lipase     Component Value Date/Time   LIPASE 41 10/25/2020 0442       Studies/Results: No results found.  Anti-infectives: Anti-infectives (From admission, onward)   Start     Dose/Rate Route Frequency Ordered Stop   10/26/20 1400  cefTRIAXone (ROCEPHIN) 2 g in sodium chloride 0.9 % 100 mL IVPB        2 g 200 mL/hr over 30 Minutes Intravenous Every 24 hours 10/26/20 0750     10/24/20 0600  ceFEPIme (MAXIPIME) 2 g in sodium chloride 0.9 % 100 mL IVPB  Status:  Discontinued        2 g 200 mL/hr over 30 Minutes Intravenous Every 8 hours 10/23/20 2230 10/26/20 0752  10/23/20 2215  ceFEPIme (MAXIPIME) 2 g in sodium chloride 0.9 % 100 mL IVPB        2 g 200 mL/hr over 30 Minutes Intravenous  Once 10/23/20 2122 10/23/20 2230   10/23/20 2200  metroNIDAZOLE (FLAGYL) IVPB 500 mg        500 mg 100 mL/hr over 60 Minutes Intravenous Every 8 hours 10/23/20 2100         Assessment/Plan Symptomatic bradycardia s/p permanent pacemaker implantation HTN  Acute Cholecystitis  Hyperbilirubinemia w/ possible Choledocholithiasis Biliarypancreatitis - Lipase normalized (85 > 3849 > 1055 > 41) - HIDA scan performed on 3/18 demonstrated prompt uptake activity by the liver but there was no biliary excretion either to the gallbladder or the small bowel concerning for biliary stasis. - LFTs and T. Bili downtreding. WBC wnl.             T. Bili 4.4 > 7.7 > 4.4 > 2.8             AST 274 > 202 > 189 > 133             ALT 321> 223 > 182 > 167             Alk Phos 147 > 138 > 140 > 143 - GI planning  EUS/ERCP today.  - Will plan for lap chole tomorrow. I have explained the procedure, risks, and aftercare of cholecystectomy.  Risks include but are not limited to anesthesia (MI, CVA, death), bleeding, infection, wound problems, bile leak, injury to common bile duct/liver/intestine, and post op diarrhea.  He seems to understand and agrees to proceed - Continue IV abx.   FEN - NPO, IVF per TRH VTE - SCDs, okay for chemical prophyalxis from a general surgery standpoint ID - Cefepime/Flagyl   LOS: 3 days    Jillyn Ledger , [facility] Surgery 10/26/2020, 8:42 AM Please see Amion for pager number during day hours 7:00am-4:30pm

## 2020-10-27 ENCOUNTER — Inpatient Hospital Stay (HOSPITAL_COMMUNITY): Payer: Medicare Other | Admitting: Certified Registered Nurse Anesthetist

## 2020-10-27 ENCOUNTER — Encounter (HOSPITAL_COMMUNITY): Payer: Self-pay | Admitting: Internal Medicine

## 2020-10-27 ENCOUNTER — Encounter (HOSPITAL_COMMUNITY)
Admission: AD | Disposition: A | Discharge status: Home / Self Care | Payer: Self-pay | Source: Other Acute Inpatient Hospital | Attending: Internal Medicine

## 2020-10-27 HISTORY — PX: CHOLECYSTECTOMY: SHX55

## 2020-10-27 LAB — COMPREHENSIVE METABOLIC PANEL
ALT: 157 U/L — ABNORMAL HIGH (ref 0–44)
AST: 137 U/L — ABNORMAL HIGH (ref 15–41)
Albumin: 2.6 g/dL — ABNORMAL LOW (ref 3.5–5.0)
Alkaline Phosphatase: 189 U/L — ABNORMAL HIGH (ref 38–126)
Anion gap: 9 (ref 5–15)
BUN: 18 mg/dL (ref 8–23)
CO2: 21 mmol/L — ABNORMAL LOW (ref 22–32)
Calcium: 8.6 mg/dL — ABNORMAL LOW (ref 8.9–10.3)
Chloride: 107 mmol/L (ref 98–111)
Creatinine, Ser: 0.91 mg/dL (ref 0.61–1.24)
GFR, Estimated: >60 mL/min (ref >60)
Glucose, Bld: 118 mg/dL — ABNORMAL HIGH (ref 70–99)
Potassium: 3.7 mmol/L (ref 3.5–5.1)
Sodium: 137 mmol/L (ref 135–145)
Total Bilirubin: 5.3 mg/dL — ABNORMAL HIGH (ref 0.3–1.2)
Total Protein: 5.4 g/dL — ABNORMAL LOW (ref 6.5–8.1)

## 2020-10-27 SURGERY — LAPAROSCOPIC CHOLECYSTECTOMY
Anesthesia: General | Site: Abdomen

## 2020-10-27 MED ORDER — MIDAZOLAM HCL 2 MG/2ML IJ SOLN
INTRAMUSCULAR | Status: AC
  Filled 2020-10-27: qty 2

## 2020-10-27 MED ORDER — LIDOCAINE 2% (20 MG/ML) 5 ML SYRINGE
INTRAMUSCULAR | Status: DC | PRN
  Administered 2020-10-27: 80 mg via INTRAVENOUS

## 2020-10-27 MED ORDER — KETOROLAC TROMETHAMINE 30 MG/ML IJ SOLN
INTRAMUSCULAR | Status: AC
  Administered 2020-10-27: 30 mg via INTRAVENOUS
  Filled 2020-10-27: qty 1

## 2020-10-27 MED ORDER — FENTANYL CITRATE (PF) 250 MCG/5ML IJ SOLN
INTRAMUSCULAR | Status: AC
  Filled 2020-10-27: qty 5

## 2020-10-27 MED ORDER — SODIUM CHLORIDE 0.9 % IR SOLN
Status: DC | PRN
  Administered 2020-10-27: 1000 mL

## 2020-10-27 MED ORDER — 0.9 % SODIUM CHLORIDE (POUR BTL) OPTIME
TOPICAL | Status: DC | PRN
  Administered 2020-10-27: 1000 mL

## 2020-10-27 MED ORDER — LIDOCAINE 2% (20 MG/ML) 5 ML SYRINGE
INTRAMUSCULAR | Status: AC
  Filled 2020-10-27: qty 5

## 2020-10-27 MED ORDER — PHENYLEPHRINE 40 MCG/ML (10ML) SYRINGE FOR IV PUSH (FOR BLOOD PRESSURE SUPPORT)
PREFILLED_SYRINGE | INTRAVENOUS | Status: AC
  Filled 2020-10-27: qty 20

## 2020-10-27 MED ORDER — ONDANSETRON HCL 4 MG/2ML IJ SOLN
INTRAMUSCULAR | Status: AC
  Filled 2020-10-27: qty 2

## 2020-10-27 MED ORDER — LACTATED RINGERS IV SOLN: INTRAVENOUS | Status: DC

## 2020-10-27 MED ORDER — BUPIVACAINE HCL (PF) 0.25 % IJ SOLN
INTRAMUSCULAR | Status: AC
  Filled 2020-10-27: qty 30

## 2020-10-27 MED ORDER — BUPIVACAINE-EPINEPHRINE 0.25% -1:200000 IJ SOLN
INTRAMUSCULAR | Status: DC | PRN
  Administered 2020-10-27: 21 mL

## 2020-10-27 MED ORDER — ONDANSETRON HCL 4 MG/2ML IJ SOLN: 4.0000 mg | Freq: Once | INTRAMUSCULAR | Status: DC | PRN

## 2020-10-27 MED ORDER — ROCURONIUM BROMIDE 10 MG/ML (PF) SYRINGE
PREFILLED_SYRINGE | INTRAVENOUS | Status: DC | PRN
  Administered 2020-10-27: 70 mg via INTRAVENOUS

## 2020-10-27 MED ORDER — SUGAMMADEX SODIUM 200 MG/2ML IV SOLN
INTRAVENOUS | Status: DC | PRN
  Administered 2020-10-27: 200 mg via INTRAVENOUS

## 2020-10-27 MED ORDER — FENTANYL CITRATE (PF) 250 MCG/5ML IJ SOLN
INTRAMUSCULAR | Status: DC | PRN
  Administered 2020-10-27 (×2): 50 ug via INTRAVENOUS
  Administered 2020-10-27 (×2): 100 ug via INTRAVENOUS
  Administered 2020-10-27 (×2): 50 ug via INTRAVENOUS

## 2020-10-27 MED ORDER — MIDAZOLAM HCL 2 MG/2ML IJ SOLN
INTRAMUSCULAR | Status: DC | PRN
  Administered 2020-10-27: 2 mg via INTRAVENOUS

## 2020-10-27 MED ORDER — FENTANYL CITRATE (PF) 100 MCG/2ML IJ SOLN: 25.0000 ug | INTRAMUSCULAR | Status: DC | PRN

## 2020-10-27 MED ORDER — DEXAMETHASONE SODIUM PHOSPHATE 10 MG/ML IJ SOLN
INTRAMUSCULAR | Status: AC
  Filled 2020-10-27: qty 1

## 2020-10-27 MED ORDER — PROPOFOL 10 MG/ML IV BOLUS
INTRAVENOUS | Status: DC | PRN
  Administered 2020-10-27: 140 mg via INTRAVENOUS
  Administered 2020-10-27: 40 mg via INTRAVENOUS

## 2020-10-27 MED ORDER — OXYCODONE HCL 5 MG PO TABS
ORAL_TABLET | ORAL | Status: AC
  Filled 2020-10-27: qty 2

## 2020-10-27 MED ORDER — ROCURONIUM BROMIDE 10 MG/ML (PF) SYRINGE
PREFILLED_SYRINGE | INTRAVENOUS | Status: AC
  Filled 2020-10-27: qty 30

## 2020-10-27 MED ORDER — PROPOFOL 10 MG/ML IV BOLUS
INTRAVENOUS | Status: AC
  Filled 2020-10-27: qty 20

## 2020-10-27 MED ORDER — CHLORHEXIDINE GLUCONATE 0.12 % MT SOLN
OROMUCOSAL | Status: AC
  Administered 2020-10-27: 15 mL
  Filled 2020-10-27: qty 15

## 2020-10-27 MED ORDER — KETOROLAC TROMETHAMINE 30 MG/ML IJ SOLN: 30.0000 mg | Freq: Once | INTRAMUSCULAR | Status: AC | PRN

## 2020-10-27 MED ORDER — ONDANSETRON HCL 4 MG/2ML IJ SOLN
INTRAMUSCULAR | Status: DC | PRN
  Administered 2020-10-27: 4 mg via INTRAVENOUS

## 2020-10-27 MED ORDER — DEXAMETHASONE SODIUM PHOSPHATE 10 MG/ML IJ SOLN
INTRAMUSCULAR | Status: DC | PRN
  Administered 2020-10-27: 10 mg via INTRAVENOUS

## 2020-10-27 MED ORDER — OXYCODONE HCL 5 MG PO TABS
5.0000 mg | ORAL_TABLET | Freq: Four times a day (QID) | ORAL | Status: DC | PRN
  Administered 2020-10-27 – 2020-10-28 (×3): 10 mg via ORAL
  Filled 2020-10-27 (×2): qty 2

## 2020-10-27 MED ORDER — MEPERIDINE HCL 25 MG/ML IJ SOLN: 6.2500 mg | INTRAMUSCULAR | Status: DC | PRN

## 2020-10-27 MED ORDER — MORPHINE SULFATE (PF) 2 MG/ML IV SOLN: 2.0000 mg | INTRAVENOUS | Status: DC | PRN

## 2020-10-27 SURGICAL SUPPLY — 44 items
ADH SKN CLS APL DERMABOND .7 (GAUZE/BANDAGES/DRESSINGS) ×1
APL PRP STRL LF DISP 70% ISPRP (MISCELLANEOUS) ×1
APPLIER CLIP 5 13 M/L LIGAMAX5 (MISCELLANEOUS) ×2
APR CLP MED LRG 5 ANG JAW (MISCELLANEOUS) ×1
BAG SPEC RTRVL 10 TROC 200 (ENDOMECHANICALS) ×1
BLADE CLIPPER SURG (BLADE) IMPLANT
CANISTER SUCT 3000ML PPV (MISCELLANEOUS) ×2 IMPLANT
CHLORAPREP W/TINT 26 (MISCELLANEOUS) ×2 IMPLANT
CLIP APPLIE 5 13 M/L LIGAMAX5 (MISCELLANEOUS) ×1 IMPLANT
COVER SURGICAL LIGHT HANDLE (MISCELLANEOUS) ×2 IMPLANT
COVER WAND RF STERILE (DRAPES) ×2 IMPLANT
DERMABOND ADVANCED (GAUZE/BANDAGES/DRESSINGS) ×1
DERMABOND ADVANCED .7 DNX12 (GAUZE/BANDAGES/DRESSINGS) ×1 IMPLANT
ELECT REM PT RETURN 9FT ADLT (ELECTROSURGICAL) ×2
ELECTRODE REM PT RTRN 9FT ADLT (ELECTROSURGICAL) ×1 IMPLANT
GLOVE BIO SURGEON STRL SZ8 (GLOVE) ×2 IMPLANT
GLOVE SRG 8 PF TXTR STRL LF DI (GLOVE) ×1 IMPLANT
GLOVE SURG UNDER POLY LF SZ8 (GLOVE) ×2
GOWN STRL REUS W/ TWL LRG LVL3 (GOWN DISPOSABLE) ×2 IMPLANT
GOWN STRL REUS W/ TWL XL LVL3 (GOWN DISPOSABLE) ×1 IMPLANT
GOWN STRL REUS W/TWL LRG LVL3 (GOWN DISPOSABLE) ×4
GOWN STRL REUS W/TWL XL LVL3 (GOWN DISPOSABLE) ×2
KIT BASIN OR (CUSTOM PROCEDURE TRAY) ×2 IMPLANT
KIT TURNOVER KIT B (KITS) ×2 IMPLANT
L-HOOK LAP DISP 36CM (ELECTROSURGICAL) ×2
LHOOK LAP DISP 36CM (ELECTROSURGICAL) ×1 IMPLANT
NEEDLE 22X1 1/2 (OR ONLY) (NEEDLE) ×2 IMPLANT
NS IRRIG 1000ML POUR BTL (IV SOLUTION) ×2 IMPLANT
PAD ARMBOARD 7.5X6 YLW CONV (MISCELLANEOUS) ×2 IMPLANT
PENCIL BUTTON HOLSTER BLD 10FT (ELECTRODE) ×2 IMPLANT
POUCH RETRIEVAL ECOSAC 10 (ENDOMECHANICALS) ×1 IMPLANT
POUCH RETRIEVAL ECOSAC 10MM (ENDOMECHANICALS) ×2
SCISSORS LAP 5X35 DISP (ENDOMECHANICALS) ×2 IMPLANT
SET IRRIG TUBING LAPAROSCOPIC (IRRIGATION / IRRIGATOR) ×2 IMPLANT
SET TUBE SMOKE EVAC HIGH FLOW (TUBING) ×2 IMPLANT
SLEEVE ENDOPATH XCEL 5M (ENDOMECHANICALS) ×4 IMPLANT
SPECIMEN JAR SMALL (MISCELLANEOUS) ×2 IMPLANT
SUT VIC AB 4-0 PS2 27 (SUTURE) ×2 IMPLANT
TOWEL GREEN STERILE (TOWEL DISPOSABLE) ×2 IMPLANT
TOWEL GREEN STERILE FF (TOWEL DISPOSABLE) ×2 IMPLANT
TRAY LAPAROSCOPIC MC (CUSTOM PROCEDURE TRAY) ×2 IMPLANT
TROCAR XCEL BLUNT TIP 100MML (ENDOMECHANICALS) ×2 IMPLANT
TROCAR XCEL NON-BLD 5MMX100MML (ENDOMECHANICALS) ×2 IMPLANT
WATER STERILE IRR 1000ML POUR (IV SOLUTION) ×2 IMPLANT

## 2020-10-27 NOTE — Care Management Important Message (Signed)
Important Message  Patient Details  Name: Mark [name] MRN: [id number] Date of Birth: March 31, 1956   Medicare Important Message Given:  Yes     Dorena Bodo 10/27/2020, 4:02 PM

## 2020-10-27 NOTE — Op Note (Signed)
  10/23/2020 - 10/27/2020  12:10 PM  PATIENT:  Mark [name]  65 y.o. male  PRE-OPERATIVE DIAGNOSIS:  Choledocholithiasis  POST-OPERATIVE DIAGNOSIS:  Choledocholithiasis  PROCEDURE:  Procedure(s): LAPAROSCOPIC CHOLECYSTECTOMY  SURGEON:  Surgeon(s): [name], MD  ASSISTANTS: Leary Roca, PA-C   ANESTHESIA:   local and general  EBL:  Total I/O In: 400 [I.V.:400] Out: -   BLOOD ADMINISTERED:none  DRAINS: none   SPECIMEN:  Excision  DISPOSITION OF SPECIMEN:  PATHOLOGY  COUNTS:  YES  DICTATION: .Dragon Dictation Procedure in detail: Informed consent was obtained.  He is currently on IV antibiotics.  He was brought to the operating room and general endotracheal anesthesia was administered by the anesthesia staff.  His abdomen was prepped and draped in a sterile fashion.  We did a timeout procedure.The supraumbilical region was infiltrated with local. supraumbilical incision was made. Subcutaneous tissues were dissected down revealing the anterior fascia. This was divided sharply along the midline. Peritoneal cavity was entered under direct vision without complication. A 0 Vicryl pursestring was placed around the fascial opening. Hassan trocar was inserted into the abdomen. The abdomen was insufflated with carbon dioxide in standard fashion. Under direct vision a 5 mm epigastric and 5 mm right abdominal port x2 were placed.  Local was used at each port site.  Laparoscopic exploration revealed the gallbladder had some moderate inflammation.  The dome of the gallbladder was retracted superior medially.  The infundibulum was retracted inferior laterally.  Dissection began laterally and progressed medially.  We obtained a critical view of safety between the cystic duct, the liver, and the gallbladder.  I placed 3 clips on the cystic duct distally and one proximally.  I divided the cystic duct and we continued further dissection.  Identified the cystic artery.  This was clipped twice  proximally and divided distally with cautery.  The gallbladder was taken off the liver bed using cautery and achieving excellent hemostasis.  The gallbladder was placed into a bag and removed from the abdomen.  It was sent to pathology.  The liver bed was then copiously irrigated and hemostasis was ensured.  Clips remain in good position.  Irrigation fluid was evacuated.  Liver bed was dry.  Ports were removed under direct vision.  Pneumoperitoneum was released.  Supraumbilical fascia was closed by tying the pursestring.  All 4 wounds were irrigated and the skin of each was closed with 4-0 Vicryl followed by Dermabond.  All counts were correct.  He tolerated the procedure well without apparent complication was taken recovery in stable condition.  PATIENT DISPOSITION:  PACU - hemodynamically stable.   Delay start of Pharmacological VTE agent (>24hrs) due to surgical blood loss or risk of bleeding:  no  [name], MD, MPH, FACS Pager: [phone]  3/23/202212:10 PM

## 2020-10-27 NOTE — Transfer of Care (Signed)
Immediate Anesthesia Transfer of Care Note  Patient: Mark [name]  Procedure(s) Performed: LAPAROSCOPIC CHOLECYSTECTOMY (N/A Abdomen)  Patient Location: PACU  Anesthesia Type:General  Level of Consciousness: awake, patient cooperative and responds to stimulation  Airway & Oxygen Therapy: Patient Spontanous Breathing  Post-op Assessment: Report given to RN and Post -op Vital signs reviewed and stable  Post vital signs: Reviewed and stable  Last Vitals:  Vitals Value Taken Time  BP 157/63 10/27/20 1228  Temp    Pulse 61 10/27/20 1229  Resp 13 10/27/20 1229  SpO2 94 % 10/27/20 1229  Vitals shown include unvalidated device data.  Last Pain:  Vitals:   10/27/20 1006  TempSrc: Oral  PainSc:       Patients Stated Pain Goal: 1 (10/25/20 2207)  Complications: No complications documented.

## 2020-10-27 NOTE — Anesthesia Preprocedure Evaluation (Addendum)
Anesthesia Evaluation  Patient identified by MRN, date of birth, ID band Patient awake    Reviewed: Allergy & Precautions, H&P , NPO status , Patient's Chart, lab work & pertinent test results  Airway Mallampati: II   Neck ROM: full    Dental no notable dental hx.    Pulmonary sleep apnea ,    breath sounds clear to auscultation       Cardiovascular + pacemaker  Rhythm:regular Rate:Normal  TTE (2019): EF 55%   Neuro/Psych  Headaches, PSYCHIATRIC DISORDERS Anxiety Depression  Neuromuscular disease    GI/Hepatic GERD  Medicated,  Endo/Other    Renal/GU      Musculoskeletal  (+) Arthritis , Osteoarthritis,    Abdominal (+) + obese,   Peds  Hematology   Anesthesia Other Findings   Reproductive/Obstetrics                                                             Anesthesia Evaluation  Patient identified by MRN, date of birth, ID band Patient awake    Reviewed: Allergy & Precautions, H&P , NPO status , Patient's Chart, lab work & pertinent test results  Airway Mallampati: II   Neck ROM: full    Dental   Pulmonary sleep apnea ,    breath sounds clear to auscultation       Cardiovascular + pacemaker + Cardiac Defibrillator  Rhythm:regular Rate:Normal  TTE (2019): EF 55%   Neuro/Psych  Headaches, PSYCHIATRIC DISORDERS Anxiety Depression    GI/Hepatic GERD  ,  Endo/Other    Renal/GU      Musculoskeletal  (+) Arthritis ,   Abdominal   Peds  Hematology   Anesthesia Other Findings   Reproductive/Obstetrics                             Anesthesia Physical Anesthesia Plan  ASA: III  Anesthesia Plan: General   Post-op Pain Management:    Induction: Intravenous  PONV Risk Score and Plan: 2 and Ondansetron, Dexamethasone, Midazolam and Treatment may vary due to age or medical condition  Airway Management Planned: Oral  ETT  Additional Equipment:   Intra-op Plan:   Post-operative Plan: Extubation in OR  Informed Consent: I have reviewed the patients History and Physical, chart, labs and discussed the procedure including the risks, benefits and alternatives for the proposed anesthesia with the patient or authorized representative who has indicated his/her understanding and acceptance.     Dental advisory given  Plan Discussed with: CRNA, Anesthesiologist and Surgeon  Anesthesia Plan Comments:         Anesthesia Quick Evaluation  Anesthesia Physical  Anesthesia Plan  ASA: III  Anesthesia Plan: General   Post-op Pain Management:    Induction: Intravenous  PONV Risk Score and Plan: 2 and Ondansetron, Dexamethasone, Midazolam and Treatment may vary due to age or medical condition  Airway Management Planned: Oral ETT  Additional Equipment: None  Intra-op Plan:   Post-operative Plan: Extubation in OR  Informed Consent: I have reviewed the patients History and Physical, chart, labs and discussed the procedure including the risks, benefits and alternatives for the proposed anesthesia with the patient or authorized representative who has indicated his/her understanding and acceptance.     Dental advisory given  Plan Discussed with: CRNA  Anesthesia Plan Comments: (Will use a magnet.)       Anesthesia Quick Evaluation

## 2020-10-27 NOTE — Progress Notes (Signed)
1 Day Post-Op   Subjective: No abd pain ROS negative except as listed above. Objective: Vital signs in last 24 hours: Temp:  [97.7 F (36.5 C)-98.4 F (36.9 C)] 98.2 F (36.8 C) (03/23 0444) Pulse Rate:  [59-62] 59 (03/23 0444) Resp:  [12-18] 15 (03/23 0444) BP: (153-181)/(66-82) 163/74 (03/23 0444) SpO2:  [98 %-100 %] 98 % (03/23 0444) Weight:  [104.3 kg] 104.3 kg (03/22 1254) Last BM Date: 10/26/20  Intake/Output from previous day: 03/22 0701 - 03/23 0700 In: 555.5 [P.O.:120; I.V.:335.5; IV Piggyback:100] Out: -  Intake/Output this shift: No intake/output data recorded.  General appearance: alert and cooperative Resp: clear to auscultation bilaterally Cardio: regular rate and rhythm GI: soft, NT Extremities: calves soft  Lab Results: CBC  Recent Labs    10/25/20 0442 10/26/20 0219  WBC 7.5 9.2  HGB 13.0 14.1  HCT 35.6* 38.7*  PLT 118* 135*   BMET Recent Labs    10/26/20 0219 10/27/20 0126  NA 138 137  K 3.6 3.7  CL 107 107  CO2 23 21*  GLUCOSE 83 118*  BUN 15 18  CREATININE 0.98 0.91  CALCIUM 8.7* 8.6*   PT/INR No results for input(s): LABPROT, INR in the last 72 hours. ABG No results for input(s): PHART, HCO3 in the last 72 hours.  Invalid input(s): PCO2, PO2  Studies/Results: DG ERCP BILIARY & PANCREATIC DUCTS  Result Date: 10/26/2020 CLINICAL DATA:  Choledocholithiasis. EXAM: ERCP TECHNIQUE: Multiple spot images obtained with the fluoroscopic device and submitted for interpretation post-procedure. COMPARISON:  None. FINDINGS: Imaging performed with a C-arm demonstrates cannulation of the common bile duct with contrast injection demonstrating no evidence of significant biliary dilatation or significant visualized filling defects. Contrast does reflux through the cystic duct and partially enters the gallbladder. A balloon sweep maneuver was performed. IMPRESSION: No discrete biliary filling defects identified on the cholangiogram images. Balloon  sweep maneuver was performed in the common bile duct. These images were submitted for radiologic interpretation only. Please see the procedural report for the amount of contrast and the fluoroscopy time utilized. Electronically Signed   By: Irish Lack M.D.   On: 10/26/2020 15:29    Anti-infectives: Anti-infectives (From admission, onward)   Start     Dose/Rate Route Frequency Ordered Stop   10/26/20 1400  cefTRIAXone (ROCEPHIN) 2 g in sodium chloride 0.9 % 100 mL IVPB        2 g 200 mL/hr over 30 Minutes Intravenous Every 24 hours 10/26/20 0750     10/24/20 0600  ceFEPIme (MAXIPIME) 2 g in sodium chloride 0.9 % 100 mL IVPB  Status:  Discontinued        2 g 200 mL/hr over 30 Minutes Intravenous Every 8 hours 10/23/20 2230 10/26/20 0752   10/23/20 2215  ceFEPIme (MAXIPIME) 2 g in sodium chloride 0.9 % 100 mL IVPB        2 g 200 mL/hr over 30 Minutes Intravenous  Once 10/23/20 2122 10/23/20 2230   10/23/20 2200  metroNIDAZOLE (FLAGYL) IVPB 500 mg        500 mg 100 mL/hr over 60 Minutes Intravenous Every 8 hours 10/23/20 2100        Assessment/Plan:  Symptomatic bradycardia s/p permanent pacemaker implantation HTN  Acute Cholecystitis  Hyperbilirubinemia w/ possible Choledocholithiasis Biliarypancreatitis - Lipase normalized (85 > 3849 > 1055 > 41) - HIDA scan performed on 3/18 demonstrated prompt uptake activity by the liver but there was no biliary excretion either to the gallbladder or the small  bowel concerning for biliary stasis. - LFTs and T. Bili downtreding - S/P EUS and ERCP 3/22 by Dr. Elnoria Howard  - Plan for lap chole today. I have explained the procedure, risks, and aftercare of cholecystectomy.  Risks include but are not limited to anesthesia (MI, CVA, death), bleeding, infection, wound problems, bile leak, injury to common bile duct/liver/intestine, and post op diarrhea.  He seems to understand and agrees to proceed - Continue IV abx.   FEN - NPO, IVF per [facility] VTE -  SCDs, okay for chemical prophyalxis from a general surgery standpoint ID - Cefepime/Flagyl  LOS: 4 days    [name], MD, MPH, FACS Trauma & General Surgery Use [email or web address] to contact on call provider  3/23/2022Patient ID: Mark [name], male   DOB: [birth year], 65 y.o.   MRN: [id number]

## 2020-10-27 NOTE — Progress Notes (Signed)
Patient does not wear CPAP/BiPAP and doesn't care to at this time.

## 2020-10-27 NOTE — Anesthesia Procedure Notes (Signed)
Procedure Name: Intubation Date/Time: 10/27/2020 11:22 AM Performed by: Drema Pry, CRNA Pre-anesthesia Checklist: Patient identified, Emergency Drugs available, Suction available and Patient being monitored Patient Re-evaluated:Patient Re-evaluated prior to induction Oxygen Delivery Method: Circle System Utilized Preoxygenation: Pre-oxygenation with 100% oxygen Induction Type: IV induction Ventilation: Mask ventilation without difficulty and Oral airway inserted - appropriate to patient size Laryngoscope Size: Glidescope and 3 Grade View: Grade I Tube type: Oral Number of attempts: 2 Airway Equipment and Method: Stylet and Oral airway Placement Confirmation: ETT inserted through vocal cords under direct vision,  positive ETCO2 and breath sounds checked- equal and bilateral Tube secured with: Tape Dental Injury: Teeth and Oropharynx as per pre-operative assessment  Difficulty Due To: Difficulty was anticipated, Difficult Airway- due to limited oral opening and Difficult Airway- due to reduced neck mobility Future Recommendations: Recommend- induction with short-acting agent, and alternative techniques readily available

## 2020-10-27 NOTE — Progress Notes (Signed)
PROGRESS NOTE    Mark [name]  GNF:[id number] DOB: 07/19/1956 DOA: 10/23/2020 PCP: Eustaquio BoydenGutierrez, Javier, MD   Chief Complain: Abdominal pain  Brief Narrative: Patient is a 65 year old male with history of chronic pain syndrome, light sensitive, symptomatic bradycardia status post permanent pacemaker implantation, hyperlipidemia, hypertension who [facility] with 3-day history of abdominal pain, nausea, vomiting.  He was found to have acute cholecystitis/possible choledocholithiasis, biliary pancreatitis with elevated lipase, liver enzymes.  Patient was transferred here for GI consult and possible ERCP.  General surgery also following.  Currently on antibiotics.Underwent  ERCP on 10/26/20 and the plan is for  lap chole today  Assessment & Plan:   Principal Problem:   Choledocholithiasis Active Problems:   Chronic pain syndrome   History of permanent cardiac pacemaker placement   Obstructive sleep apnea   Acute cholecystitis   Acute gallstone pancreatitis   Acute cholecystitis/possible choledocholithiasis/biliary pancreatitis: Presented with abdominal pain, elevated liver enzymes, lipase.  Liver function is improving. Continue analgesics, antiemetics, IV fluids.  GI and general surgery following.  General surgery planning for laparoscopic cholecystectomy  .  Status post ERCP with biliary sphincterotomy on 10/26/20  Hypertension: Currently blood pressure stable.  Monitor blood pressure.  Bilateral lower extremity edema: On Lasix 20 mg daily at home.  Also has new right upper extremity edema most likely secondary to IV cannula.  Symptomatic bradycardia: Status post permanent pacemaker implantation.   Chronic pain syndrome: On oxycodone at home.  Hyperlipidemia: On Lipitor  History of depression: On fluoxetine  OSA: on CPAP  Thrombocytopenia: Unclear etiology.  Continue to monitor.  Positive stool Cologuard: Has family history of colon cancer.  GI recommended  surveillance colonoscopy .           DVT prophylaxis:SCD Code Status: Full Family Communication: None at bedside Status is: Inpatient  Remains inpatient appropriate because:Inpatient level of care appropriate due to severity of illness   Dispo: The patient is from: Home              Anticipated d/c is to: Home              Patient currently is not medically stable to d/c.   Difficult to place patient No    Consultants: GI,general surgery  Procedures:None  Antimicrobials:  Anti-infectives (From admission, onward)   Start     Dose/Rate Route Frequency Ordered Stop   10/26/20 1400  cefTRIAXone (ROCEPHIN) 2 g in sodium chloride 0.9 % 100 mL IVPB        2 g 200 mL/hr over 30 Minutes Intravenous Every 24 hours 10/26/20 0750     10/24/20 0600  ceFEPIme (MAXIPIME) 2 g in sodium chloride 0.9 % 100 mL IVPB  Status:  Discontinued        2 g 200 mL/hr over 30 Minutes Intravenous Every 8 hours 10/23/20 2230 10/26/20 0752   10/23/20 2215  ceFEPIme (MAXIPIME) 2 g in sodium chloride 0.9 % 100 mL IVPB        2 g 200 mL/hr over 30 Minutes Intravenous  Once 10/23/20 2122 10/23/20 2230   10/23/20 2200  metroNIDAZOLE (FLAGYL) IVPB 500 mg        500 mg 100 mL/hr over 60 Minutes Intravenous Every 8 hours 10/23/20 2100        Subjective: Patient seen and examined the bedside this morning.  Hemodynamically stable.  Comfortable.  Complains of some bloating but denies any abdomen pain, nausea or vomiting  Objective: Vitals:   10/26/20  1516 10/26/20 1538 10/26/20 1951 10/27/20 0444  BP: (!) 165/74 (!) 153/69 (!) 169/82 (!) 163/74  Pulse: (!) 59 61 (!) 59 (!) 59  Resp: 12 18 17 15   Temp:  98.4 F (36.9 C) 97.9 F (36.6 C) 98.2 F (36.8 C)  TempSrc:  Oral Oral Oral  SpO2: 98% 100% 98% 98%  Weight:      Height:        Intake/Output Summary (Last 24 hours) at 10/27/2020 10/29/2020 Last data filed at 10/26/2020 2200 Gross per 24 hour  Intake 555.45 ml  Output --  Net 555.45 ml   Filed  Weights   10/26/20 1254  Weight: 104.3 kg    Examination:  General exam: Overall comfortable, not in distress HEENT: PERRL Respiratory system:  no wheezes or crackles  Cardiovascular system: S1 & S2 heard, RRR.  Gastrointestinal system: Abdomen is mildly distended, soft and nontender. Central nervous system: Alert and oriented Extremities: No edema, no clubbing ,no cyanosis Skin: No rashes, no ulcers,no icterus   Data Reviewed: I have personally reviewed following labs and imaging studies  CBC: Recent Labs  Lab 10/24/20 0049 10/25/20 0442 10/26/20 0219  WBC 8.4 7.5 9.2  NEUTROABS  --  5.5 6.2  HGB 13.8 13.0 14.1  HCT 39.2 35.6* 38.7*  MCV 86.9 86.0 86.4  PLT 133* 118* 135*   Basic Metabolic Panel: Recent Labs  Lab 10/24/20 0049 10/25/20 0442 10/26/20 0219 10/27/20 0126  NA 141 136 138 137  K 4.1 4.8 3.6 3.7  CL 109 109 107 107  CO2 21* 19* 23 21*  GLUCOSE 111* 95 83 118*  BUN 19 16 15 18   CREATININE 0.98 0.88 0.98 0.91  CALCIUM 8.7* 8.3* 8.7* 8.6*   GFR: Estimated Creatinine Clearance: 95.7 mL/min (by C-G formula based on SCr of 0.91 mg/dL). Liver Function Tests: Recent Labs  Lab 10/24/20 0049 10/25/20 0442 10/26/20 0219 10/27/20 0126  AST 202* 189* 133* 137*  ALT 223* 182* 167* 157*  ALKPHOS 138* 140* 143* 189*  BILITOT 7.7* 4.4* 2.8* 5.3*  PROT 5.5* 4.7* 5.3* 5.4*  ALBUMIN 2.8* 2.5* 2.6* 2.6*   Recent Labs  Lab 10/25/20 0442  LIPASE 41   No results for input(s): AMMONIA in the last 168 hours. Coagulation Profile: No results for input(s): INR, PROTIME in the last 168 hours. Cardiac Enzymes: No results for input(s): CKTOTAL, CKMB, CKMBINDEX, TROPONINI in the last 168 hours. BNP (last 3 results) No results for input(s): PROBNP in the last 8760 hours. HbA1C: No results for input(s): HGBA1C in the last 72 hours. CBG: No results for input(s): GLUCAP in the last 168 hours. Lipid Profile: No results for input(s): CHOL, HDL, LDLCALC, TRIG,  CHOLHDL, LDLDIRECT in the last 72 hours. Thyroid Function Tests: No results for input(s): TSH, T4TOTAL, FREET4, T3FREE, THYROIDAB in the last 72 hours. Anemia Panel: No results for input(s): VITAMINB12, FOLATE, FERRITIN, TIBC, IRON, RETICCTPCT in the last 72 hours. Sepsis Labs: No results for input(s): PROCALCITON, LATICACIDVEN in the last 168 hours.  Recent Results (from the past 240 hour(s))  SARS CORONAVIRUS 2 (TAT 6-24 HRS) Nasopharyngeal Nasopharyngeal Swab     Status: None   Collection Time: 10/23/20 11:08 PM   Specimen: Nasopharyngeal Swab  Result Value Ref Range Status   SARS Coronavirus 2 NEGATIVE NEGATIVE Final    Comment: (NOTE) SARS-CoV-2 target nucleic acids are NOT DETECTED.  The SARS-CoV-2 RNA is generally detectable in upper and lower respiratory specimens during the acute phase of infection. Negative results do  not preclude SARS-CoV-2 infection, do not rule out co-infections with other pathogens, and should not be used as the sole basis for treatment or other patient management decisions. Negative results must be combined with clinical observations, patient history, and epidemiological information. The expected result is Negative.  Fact Sheet for Patients: HairSlick.no  Fact Sheet for Healthcare Providers: quierodirigir.com  This test is not yet approved or cleared by the [place] FDA and  has been authorized for detection and/or diagnosis of SARS-CoV-2 by FDA under an Emergency Use Authorization (EUA). This EUA will remain  in effect (meaning this test can be used) for the duration of the COVID-19 declaration under Se ction 564(b)(1) of the Act, 21 U.S.C. section 360bbb-3(b)(1), unless the authorization is terminated or revoked sooner.  Performed at [facility], [address], [place], [place] [address]   Surgical pcr screen     Status: Abnormal   Collection Time: 10/26/20 11:34 AM    Specimen: Nasal Mucosa; Nasal Swab  Result Value Ref Range Status   MRSA, PCR POSITIVE (A) NEGATIVE Final    Comment: RESULT CALLED TO, READ BACK BY AND VERIFIED WITH: RN LOURDES A. 2000 FCP    Staphylococcus aureus POSITIVE (A) NEGATIVE Final    Comment: (NOTE) The Xpert SA Assay (FDA approved for NASAL specimens in patients 12 years of age and older), is one component of a comprehensive surveillance program. It is not intended to diagnose infection nor to guide or monitor treatment. Performed at [facility], [address], [place], [place] [address]   SARS CORONAVIRUS 2 (TAT 6-24 HRS) Nasopharyngeal Nasopharyngeal Swab     Status: None   Collection Time: 10/26/20 12:30 PM   Specimen: Nasopharyngeal Swab  Result Value Ref Range Status   SARS Coronavirus 2 NEGATIVE NEGATIVE Final    Comment: (NOTE) SARS-CoV-2 target nucleic acids are NOT DETECTED.  The SARS-CoV-2 RNA is generally detectable in upper and lower respiratory specimens during the acute phase of infection. Negative results do not preclude SARS-CoV-2 infection, do not rule out co-infections with other pathogens, and should not be used as the sole basis for treatment or other patient management decisions. Negative results must be combined with clinical observations, patient history, and epidemiological information. The expected result is Negative.  Fact Sheet for Patients: HairSlick.no  Fact Sheet for Healthcare Providers: quierodirigir.com  This test is not yet approved or cleared by the [place] FDA and  has been authorized for detection and/or diagnosis of SARS-CoV-2 by FDA under an Emergency Use Authorization (EUA). This EUA will remain  in effect (meaning this test can be used) for the duration of the COVID-19 declaration under Se ction 564(b)(1) of the Act, 21 U.S.C. section 360bbb-3(b)(1), unless the authorization is terminated or revoked  sooner.  Performed at [facility], [address] [place], [place] [address]          Radiology Studies: DG ERCP BILIARY & PANCREATIC DUCTS  Result Date: 10/26/2020 CLINICAL DATA:  Choledocholithiasis. EXAM: ERCP TECHNIQUE: Multiple spot images obtained with the fluoroscopic device and submitted for interpretation post-procedure. COMPARISON:  None. FINDINGS: Imaging performed with a C-arm demonstrates cannulation of the common bile duct with contrast injection demonstrating no evidence of significant biliary dilatation or significant visualized filling defects. Contrast does reflux through the cystic duct and partially enters the gallbladder. A balloon sweep maneuver was performed. IMPRESSION: No discrete biliary filling defects identified on the cholangiogram images. Balloon sweep maneuver was performed in the common  bile duct. These images were submitted for radiologic interpretation only. Please see the procedural report for the amount of contrast and the fluoroscopy time utilized. Electronically Signed   By: Irish Lack M.D.   On: 10/26/2020 15:29        Scheduled Meds: . atorvastatin  20 mg Oral QHS  . Chlorhexidine Gluconate Cloth  6 each Topical Q0600  . FLUoxetine  20 mg Oral QHS  . FLUoxetine  40 mg Oral q AM  . melatonin  10 mg Oral QHS  . mupirocin ointment  1 application Nasal BID  . rOPINIRole  1 mg Oral QHS   Continuous Infusions: . cefTRIAXone (ROCEPHIN)  IV    . lactated ringers 10 mL/hr at 10/26/20 1728  . metronidazole 500 mg (10/27/20 0545)  . promethazine (PHENERGAN) injection       LOS: 4 days    Time spent: 25 mins.More than 50% of that time was spent in counseling and/or coordination of care.      Burnadette Pop, MD [facility] P3/23/2022, 8:19 AM

## 2020-10-28 ENCOUNTER — Other Ambulatory Visit: Payer: Self-pay

## 2020-10-28 ENCOUNTER — Inpatient Hospital Stay (HOSPITAL_COMMUNITY): Payer: Medicare Other

## 2020-10-28 ENCOUNTER — Encounter (HOSPITAL_COMMUNITY): Payer: Self-pay | Admitting: General Surgery

## 2020-10-28 LAB — CBC
HCT: 40.4 % (ref 39.0–52.0)
Hemoglobin: 14.1 g/dL (ref 13.0–17.0)
MCH: 31.2 pg (ref 26.0–34.0)
MCHC: 34.9 g/dL (ref 30.0–36.0)
MCV: 89.4 fL (ref 80.0–100.0)
Platelets: 163 10*3/uL (ref 150–400)
RBC: 4.52 MIL/uL (ref 4.22–5.81)
RDW: 14.2 % (ref 11.5–15.5)
WBC: 15.7 10*3/uL — ABNORMAL HIGH (ref 4.0–10.5)
nRBC: 0 % (ref 0.0–0.2)

## 2020-10-28 LAB — LIPASE, BLOOD: Lipase: 39 U/L (ref 11–51)

## 2020-10-28 MED ORDER — OXYCODONE HCL 5 MG PO TABS
10.0000 mg | ORAL_TABLET | Freq: Four times a day (QID) | ORAL | Status: AC
  Administered 2020-10-28: 10 mg via ORAL
  Filled 2020-10-28: qty 2

## 2020-10-28 MED ORDER — SIMETHICONE 80 MG PO CHEW
40.0000 mg | CHEWABLE_TABLET | Freq: Four times a day (QID) | ORAL | Status: DC | PRN
  Administered 2020-10-28 (×2): 40 mg via ORAL
  Filled 2020-10-28 (×2): qty 1

## 2020-10-28 MED ORDER — KETOROLAC TROMETHAMINE 30 MG/ML IJ SOLN: 30.0000 mg | Freq: Three times a day (TID) | INTRAMUSCULAR | Status: DC | PRN

## 2020-10-28 MED ORDER — HYDROMORPHONE HCL 1 MG/ML IJ SOLN
1.0000 mg | INTRAMUSCULAR | Status: DC | PRN
  Administered 2020-10-28 – 2020-10-29 (×3): 1 mg via INTRAVENOUS
  Filled 2020-10-28 (×3): qty 1

## 2020-10-28 MED ORDER — MORPHINE SULFATE (PF) 2 MG/ML IV SOLN
2.0000 mg | INTRAVENOUS | Status: DC | PRN
Start: 2020-10-28 — End: 2020-10-28

## 2020-10-28 MED ORDER — TECHNETIUM TC 99M MEBROFENIN IV KIT
7.6000 | PACK | Freq: Once | INTRAVENOUS | Status: AC | PRN
  Administered 2020-10-28: 7.6 via INTRAVENOUS

## 2020-10-28 NOTE — Progress Notes (Signed)
1 Day Post-Op  Subjective: CC: Patient reports that he was having some soreness around his incisions postoperatively however late last night began having increased pain that he rates as a 8/10 in his right upper quadrant.  He reports he is only been able to have some Jell-O postop but denies associated increased abdominal pain, nausea or vomiting after p.o. intake.  He is ambulating.  He is voiding without difficulty.  Objective: Vital signs in last 24 hours: Temp:  [97.4 F (36.3 C)-98.6 F (37 C)] 98.3 F (36.8 C) (03/24 0433) Pulse Rate:  [59-65] 60 (03/24 0433) Resp:  [14-18] 16 (03/24 0433) BP: (123-178)/(58-78) 123/59 (03/24 0433) SpO2:  [91 %-100 %] 97 % (03/24 0433) Last BM Date: 10/26/20  Intake/Output from previous day: 03/23 0701 - 03/24 0700 In: 1603.1 [P.O.:180; I.V.:1423.1] Out: 20 [Blood:20] Intake/Output this shift: No intake/output data recorded.  PE: Gen:  Alert, NAD, pleasant HEENT: EOM's intact, pupils equal and round Card:  RRR Pulm:  CTA b/l Abd: Soft, mild distension, appropriately tender around laparoscopic incisions with moderate tenderness of the RUQ. No peritonitis. +BS. Incisions with glue intact appears well and are without drainage, bleeding, or signs of infection Ext:  No LE edema or calf tenderness Psych: A&Ox3  Skin: no rashes noted, warm and dry  Lab Results:  Recent Labs    10/26/20 0219  WBC 9.2  HGB 14.1  HCT 38.7*  PLT 135*   BMET Recent Labs    10/26/20 0219 10/27/20 0126  NA 138 137  K 3.6 3.7  CL 107 107  CO2 23 21*  GLUCOSE 83 118*  BUN 15 18  CREATININE 0.98 0.91  CALCIUM 8.7* 8.6*   PT/INR No results for input(s): LABPROT, INR in the last 72 hours. CMP     Component Value Date/Time   NA 137 10/27/2020 0126   NA 139 12/09/2013 1601   K 3.7 10/27/2020 0126   K 3.6 12/09/2013 1601   CL 107 10/27/2020 0126   CL 105 12/09/2013 1601   CO2 21 (L) 10/27/2020 0126   CO2 28 12/09/2013 1601   GLUCOSE 118 (H)  10/27/2020 0126   GLUCOSE 110 (H) 12/09/2013 1601   BUN 18 10/27/2020 0126   BUN 20 (H) 12/09/2013 1601   CREATININE 0.91 10/27/2020 0126   CREATININE 1.01 12/09/2013 1601   CALCIUM 8.6 (L) 10/27/2020 0126   CALCIUM 9.0 12/09/2013 1601   PROT 5.4 (L) 10/27/2020 0126   PROT 7.2 12/09/2013 1601   ALBUMIN 2.6 (L) 10/27/2020 0126   ALBUMIN 4.2 12/09/2013 1601   AST 137 (H) 10/27/2020 0126   AST 42 (H) 12/09/2013 1601   ALT 157 (H) 10/27/2020 0126   ALT 42 12/09/2013 1601   ALKPHOS 189 (H) 10/27/2020 0126   ALKPHOS 60 12/09/2013 1601   BILITOT 5.3 (H) 10/27/2020 0126   BILITOT 0.6 12/09/2013 1601   GFRNONAA >60 10/27/2020 0126   GFRNONAA >60 12/09/2013 1601   GFRAA >60 12/06/2017 1040   GFRAA >60 12/09/2013 1601   Lipase     Component Value Date/Time   LIPASE 41 10/25/2020 0442       Studies/Results: DG ERCP BILIARY & PANCREATIC DUCTS  Result Date: 10/26/2020 CLINICAL DATA:  Choledocholithiasis. EXAM: ERCP TECHNIQUE: Multiple spot images obtained with the fluoroscopic device and submitted for interpretation post-procedure. COMPARISON:  None. FINDINGS: Imaging performed with a C-arm demonstrates cannulation of the common bile duct with contrast injection demonstrating no evidence of significant biliary dilatation or significant visualized  filling defects. Contrast does reflux through the cystic duct and partially enters the gallbladder. A balloon sweep maneuver was performed. IMPRESSION: No discrete biliary filling defects identified on the cholangiogram images. Balloon sweep maneuver was performed in the common bile duct. These images were submitted for radiologic interpretation only. Please see the procedural report for the amount of contrast and the fluoroscopy time utilized. Electronically Signed   By: Irish Lack M.D.   On: 10/26/2020 15:29    Anti-infectives: Anti-infectives (From admission, onward)   Start     Dose/Rate Route Frequency Ordered Stop   10/26/20 1400   cefTRIAXone (ROCEPHIN) 2 g in sodium chloride 0.9 % 100 mL IVPB  Status:  Discontinued        2 g 200 mL/hr over 30 Minutes Intravenous Every 24 hours 10/26/20 0750 10/27/20 1529   10/24/20 0600  ceFEPIme (MAXIPIME) 2 g in sodium chloride 0.9 % 100 mL IVPB  Status:  Discontinued        2 g 200 mL/hr over 30 Minutes Intravenous Every 8 hours 10/23/20 2230 10/26/20 0752   10/23/20 2215  ceFEPIme (MAXIPIME) 2 g in sodium chloride 0.9 % 100 mL IVPB        2 g 200 mL/hr over 30 Minutes Intravenous  Once 10/23/20 2122 10/23/20 2230   10/23/20 2200  metroNIDAZOLE (FLAGYL) IVPB 500 mg  Status:  Discontinued        500 mg 100 mL/hr over 60 Minutes Intravenous Every 8 hours 10/23/20 2100 10/27/20 1529       Assessment/Plan Symptomatic bradycardia s/p permanent pacemaker implantation HTN  Acute Cholecystitis- s/p Laparoscopic Cholecystectomy by Dr. Janee Morn, 3/23. POD #1 Choledocholithiasis - s/p ERCP and sphincterotomy 3/22 by Dr. Elnoria Howard Biliarypancreatitis- Lipase normalized (85 > 3849 > 1055 > 41) - will recheck today - Given increased pain overnight and elevated LFT's (t. Bili from 2.8 > 5.3), will obtain a HIDA scan to r/o bile leak. Make NPO - Mobilize, Pulm toilet.   FEN -NPO, IVF per [facility] VTE -SCDs, okay for chemical prophyalxis from a general surgery standpoint ID -Cefepime/Flagyl 3/19 - 3/22. Rocephin 3/23   LOS: 5 days    Jacinto Halim , [facility] Surgery 10/28/2020, 8:39 AM Please see Amion for pager number during day hours 7:00am-4:30pm

## 2020-10-28 NOTE — Progress Notes (Signed)
PROGRESS NOTE    Mark [name]  PYP:[id number] DOB: 1955/09/30 DOA: 10/23/2020 PCP: [name], MD   Chief Complain: Abdominal pain  Brief Narrative: Patient is a 65 year old male with history of chronic pain syndrome, light sensitive, symptomatic bradycardia status post permanent pacemaker implantation, hyperlipidemia, hypertension who initially presented to [facility] with 3-day history of abdominal pain, nausea, vomiting.  He was found to have acute cholecystitis/possible choledocholithiasis, biliary pancreatitis with elevated lipase, liver enzymes.  Patient was transferred here for GI consult and possible ERCP.  General surgery also following.  Currently on antibiotics.Underwent  ERCP on 10/26/20 and lap chole on 10/27/20.  Patient complained of increased right upper quadrant pain, surgery ordered HIDA scan.  Assessment & Plan:   Principal Problem:   Choledocholithiasis Active Problems:   Chronic pain syndrome   History of permanent cardiac pacemaker placement   Obstructive sleep apnea   Acute cholecystitis   Acute gallstone pancreatitis   Acute cholecystitis/possible choledocholithiasis/biliary pancreatitis: Presented with abdominal pain, elevated liver enzymes, lipase.  Liver function was improving. GI and general surgery were following.  Underwent  laparoscopic cholecystectomy on 10/27/20  .Status post ERCP with biliary sphincterotomy on 10/26/20. Patient complains of increased right upper quadrant pain today.  General surgery ordered HIDA scan.  Continue pain medications, supportive care.  Hypertension: Currently blood pressure stable.  Monitor blood pressure.  Bilateral lower extremity edema: resolved. On Lasix 20 mg daily at home.    Symptomatic bradycardia: Status post permanent pacemaker implantation.   Chronic pain syndrome: On oxycodone at home.  Hyperlipidemia: On Lipitor  History of depression: On fluoxetine  OSA: on CPAP  Thrombocytopenia: Unclear  etiology.  Continue to monitor.  Positive stool Cologuard: Has family history of colon cancer.  GI recommended surveillance colonoscopy .           DVT prophylaxis:SCD Code Status: Full Family Communication: None at bedside Status is: Inpatient  Remains inpatient appropriate because:Inpatient level of care appropriate due to severity of illness   Dispo: The patient is from: Home              Anticipated d/c is to: Home              Patient currently is not medically stable to d/c.   Difficult to place patient No    Consultants: GI,general surgery  Procedures:None  Antimicrobials:  Anti-infectives (From admission, onward)   Start     Dose/Rate Route Frequency Ordered Stop   10/26/20 1400  cefTRIAXone (ROCEPHIN) 2 g in sodium chloride 0.9 % 100 mL IVPB  Status:  Discontinued        2 g 200 mL/hr over 30 Minutes Intravenous Every 24 hours 10/26/20 0750 10/27/20 1529   10/24/20 0600  ceFEPIme (MAXIPIME) 2 g in sodium chloride 0.9 % 100 mL IVPB  Status:  Discontinued        2 g 200 mL/hr over 30 Minutes Intravenous Every 8 hours 10/23/20 2230 10/26/20 0752   10/23/20 2215  ceFEPIme (MAXIPIME) 2 g in sodium chloride 0.9 % 100 mL IVPB        2 g 200 mL/hr over 30 Minutes Intravenous  Once 10/23/20 2122 10/23/20 2230   10/23/20 2200  metroNIDAZOLE (FLAGYL) IVPB 500 mg  Status:  Discontinued        500 mg 100 mL/hr over 60 Minutes Intravenous Every 8 hours 10/23/20 2100 10/27/20 1529      Subjective: Patient seen and examined at the bedside this mrng. Complains of increased  right upper quadrant pain.  Appears uncomfortable  Objective: Vitals:   10/27/20 1901 10/28/20 0157 10/28/20 0433 10/28/20 1030  BP: (!) 178/61 126/66 (!) 123/59 (!) 145/74  Pulse: (!) 59 65 60 60  Resp: 18 15 16 18   Temp: 98.6 F (37 C) 98.3 F (36.8 C) 98.3 F (36.8 C) 98.5 F (36.9 C)  TempSrc:  Oral Oral Oral  SpO2: 98% 96% 97% 96%  Weight:      Height:        Intake/Output Summary  (Last 24 hours) at 10/28/2020 1128 Last data filed at 10/28/2020 0900 Gross per 24 hour  Intake 1603.06 ml  Output 20 ml  Net 1583.06 ml   Filed Weights   10/26/20 1254  Weight: 104.3 kg    Examination:  General exam: Uncomfortable due to abdominal pain HEENT: PERRL Respiratory system:  no wheezes or crackles  Cardiovascular system: S1 & S2 heard, RRR.  Gastrointestinal system: RUQ quadrant tenderness Central nervous system: Alert and oriented Extremities: No edema, no clubbing ,no cyanosis Skin: No rashes, no ulcers,no icterus      Data Reviewed: I have personally reviewed following labs and imaging studies  CBC: Recent Labs  Lab 10/24/20 0049 10/25/20 0442 10/26/20 0219 10/28/20 0945  WBC 8.4 7.5 9.2 15.7*  NEUTROABS  --  5.5 6.2  --   HGB 13.8 13.0 14.1 14.1  HCT 39.2 35.6* 38.7* 40.4  MCV 86.9 86.0 86.4 89.4  PLT 133* 118* 135* 163   Basic Metabolic Panel: Recent Labs  Lab 10/24/20 0049 10/25/20 0442 10/26/20 0219 10/27/20 0126  NA 141 136 138 137  K 4.1 4.8 3.6 3.7  CL 109 109 107 107  CO2 21* 19* 23 21*  GLUCOSE 111* 95 83 118*  BUN 19 16 15 18   CREATININE 0.98 0.88 0.98 0.91  CALCIUM 8.7* 8.3* 8.7* 8.6*   GFR: Estimated Creatinine Clearance: 95.7 mL/min (by C-G formula based on SCr of 0.91 mg/dL). Liver Function Tests: Recent Labs  Lab 10/24/20 0049 10/25/20 0442 10/26/20 0219 10/27/20 0126  AST 202* 189* 133* 137*  ALT 223* 182* 167* 157*  ALKPHOS 138* 140* 143* 189*  BILITOT 7.7* 4.4* 2.8* 5.3*  PROT 5.5* 4.7* 5.3* 5.4*  ALBUMIN 2.8* 2.5* 2.6* 2.6*   Recent Labs  Lab 10/25/20 0442 10/28/20 0945  LIPASE 41 39   No results for input(s): AMMONIA in the last 168 hours. Coagulation Profile: No results for input(s): INR, PROTIME in the last 168 hours. Cardiac Enzymes: No results for input(s): CKTOTAL, CKMB, CKMBINDEX, TROPONINI in the last 168 hours. BNP (last 3 results) No results for input(s): PROBNP in the last 8760  hours. HbA1C: No results for input(s): HGBA1C in the last 72 hours. CBG: No results for input(s): GLUCAP in the last 168 hours. Lipid Profile: No results for input(s): CHOL, HDL, LDLCALC, TRIG, CHOLHDL, LDLDIRECT in the last 72 hours. Thyroid Function Tests: No results for input(s): TSH, T4TOTAL, FREET4, T3FREE, THYROIDAB in the last 72 hours. Anemia Panel: No results for input(s): VITAMINB12, FOLATE, FERRITIN, TIBC, IRON, RETICCTPCT in the last 72 hours. Sepsis Labs: No results for input(s): PROCALCITON, LATICACIDVEN in the last 168 hours.  Recent Results (from the past 240 hour(s))  SARS CORONAVIRUS 2 (TAT 6-24 HRS) Nasopharyngeal Nasopharyngeal Swab     Status: None   Collection Time: 10/23/20 11:08 PM   Specimen: Nasopharyngeal Swab  Result Value Ref Range Status   SARS Coronavirus 2 NEGATIVE NEGATIVE Final    Comment: (NOTE) SARS-CoV-2 target nucleic  acids are NOT DETECTED.  The SARS-CoV-2 RNA is generally detectable in upper and lower respiratory specimens during the acute phase of infection. Negative results do not preclude SARS-CoV-2 infection, do not rule out co-infections with other pathogens, and should not be used as the sole basis for treatment or other patient management decisions. Negative results must be combined with clinical observations, patient history, and epidemiological information. The expected result is Negative.  Fact Sheet for Patients: HairSlick.no  Fact Sheet for Healthcare Providers: quierodirigir.com  This test is not yet approved or cleared by the [place] FDA and  has been authorized for detection and/or diagnosis of SARS-CoV-2 by FDA under an Emergency Use Authorization (EUA). This EUA will remain  in effect (meaning this test can be used) for the duration of the COVID-19 declaration under Se ction 564(b)(1) of the Act, 21 U.S.C. section 360bbb-3(b)(1), unless the authorization is  terminated or revoked sooner.  Performed at [facility], [address], [place], [place] [address]   Surgical pcr screen     Status: Abnormal   Collection Time: 10/26/20 11:34 AM   Specimen: Nasal Mucosa; Nasal Swab  Result Value Ref Range Status   MRSA, PCR POSITIVE (A) NEGATIVE Final    Comment: RESULT CALLED TO, READ BACK BY AND VERIFIED WITH: RN LOURDES A. 2000 FCP    Staphylococcus aureus POSITIVE (A) NEGATIVE Final    Comment: (NOTE) The Xpert SA Assay (FDA approved for NASAL specimens in patients 62 years of age and older), is one component of a comprehensive surveillance program. It is not intended to diagnose infection nor to guide or monitor treatment. Performed at [facility], [address], [place], [place] [address]   SARS CORONAVIRUS 2 (TAT 6-24 HRS) Nasopharyngeal Nasopharyngeal Swab     Status: None   Collection Time: 10/26/20 12:30 PM   Specimen: Nasopharyngeal Swab  Result Value Ref Range Status   SARS Coronavirus 2 NEGATIVE NEGATIVE Final    Comment: (NOTE) SARS-CoV-2 target nucleic acids are NOT DETECTED.  The SARS-CoV-2 RNA is generally detectable in upper and lower respiratory specimens during the acute phase of infection. Negative results do not preclude SARS-CoV-2 infection, do not rule out co-infections with other pathogens, and should not be used as the sole basis for treatment or other patient management decisions. Negative results must be combined with clinical observations, patient history, and epidemiological information. The expected result is Negative.  Fact Sheet for Patients: HairSlick.no  Fact Sheet for Healthcare Providers: quierodirigir.com  This test is not yet approved or cleared by the [place] FDA and  has been authorized for detection and/or diagnosis of SARS-CoV-2 by FDA under an Emergency Use Authorization (EUA). This EUA will remain  in effect  (meaning this test can be used) for the duration of the COVID-19 declaration under Se ction 564(b)(1) of the Act, 21 U.S.C. section 360bbb-3(b)(1), unless the authorization is terminated or revoked sooner.  Performed at [facility], [address] [place], [place] [address]          Radiology Studies: DG ERCP BILIARY & PANCREATIC DUCTS  Result Date: 10/26/2020 CLINICAL DATA:  Choledocholithiasis. EXAM: ERCP TECHNIQUE: Multiple spot images obtained with the fluoroscopic device and submitted for interpretation post-procedure. COMPARISON:  None. FINDINGS: Imaging performed with a C-arm demonstrates cannulation of the common bile duct with contrast injection demonstrating no evidence of significant biliary dilatation or significant visualized filling defects. Contrast does reflux through the cystic duct and partially enters the  gallbladder. A balloon sweep maneuver was performed. IMPRESSION: No discrete biliary filling defects identified on the cholangiogram images. Balloon sweep maneuver was performed in the common bile duct. These images were submitted for radiologic interpretation only. Please see the procedural report for the amount of contrast and the fluoroscopy time utilized. Electronically Signed   By: Irish LackGlenn  Yamagata M.D.   On: 10/26/2020 15:29        Scheduled Meds: . atorvastatin  20 mg Oral QHS  . Chlorhexidine Gluconate Cloth  6 each Topical Q0600  . FLUoxetine  20 mg Oral QHS  . FLUoxetine  40 mg Oral q AM  . melatonin  10 mg Oral QHS  . mupirocin ointment  1 application Nasal BID  . rOPINIRole  1 mg Oral QHS   Continuous Infusions: . lactated ringers 75 mL/hr at 10/28/20 0656     LOS: 5 days    Time spent: 25 mins.More than 50% of that time was spent in counseling and/or coordination of care.      Burnadette PopAmrit Arien Benincasa, MD [facility] P3/24/2022, 11:28 AM

## 2020-10-28 NOTE — Progress Notes (Signed)
Patient refusing BIPAP still.

## 2020-10-29 LAB — COMPREHENSIVE METABOLIC PANEL
ALT: 163 U/L — ABNORMAL HIGH (ref 0–44)
AST: 137 U/L — ABNORMAL HIGH (ref 15–41)
Albumin: 2.6 g/dL — ABNORMAL LOW (ref 3.5–5.0)
Alkaline Phosphatase: 170 U/L — ABNORMAL HIGH (ref 38–126)
Anion gap: 10 (ref 5–15)
BUN: 13 mg/dL (ref 8–23)
CO2: 22 mmol/L (ref 22–32)
Calcium: 8.6 mg/dL — ABNORMAL LOW (ref 8.9–10.3)
Chloride: 105 mmol/L (ref 98–111)
Creatinine, Ser: 0.89 mg/dL (ref 0.61–1.24)
GFR, Estimated: >60 mL/min (ref >60)
Glucose, Bld: 140 mg/dL — ABNORMAL HIGH (ref 70–99)
Potassium: 3.7 mmol/L (ref 3.5–5.1)
Sodium: 137 mmol/L (ref 135–145)
Total Bilirubin: 2.6 mg/dL — ABNORMAL HIGH (ref 0.3–1.2)
Total Protein: 5.8 g/dL — ABNORMAL LOW (ref 6.5–8.1)

## 2020-10-29 LAB — CBC
HCT: 43.7 % (ref 39.0–52.0)
Hemoglobin: 14.8 g/dL (ref 13.0–17.0)
MCH: 31 pg (ref 26.0–34.0)
MCHC: 33.9 g/dL (ref 30.0–36.0)
MCV: 91.6 fL (ref 80.0–100.0)
Platelets: 151 10*3/uL (ref 150–400)
RBC: 4.77 MIL/uL (ref 4.22–5.81)
RDW: 14.2 % (ref 11.5–15.5)
WBC: 14.1 10*3/uL — ABNORMAL HIGH (ref 4.0–10.5)
nRBC: 0 % (ref 0.0–0.2)

## 2020-10-29 LAB — SURGICAL PATHOLOGY

## 2020-10-29 MED ORDER — HYDROMORPHONE HCL 1 MG/ML IJ SOLN: 1.0000 mg | INTRAMUSCULAR | Status: DC | PRN

## 2020-10-29 MED ORDER — HYDROMORPHONE HCL 1 MG/ML IJ SOLN
0.5000 mg | INTRAMUSCULAR | Status: DC | PRN
Start: 2020-10-29 — End: 2020-10-29

## 2020-10-29 MED ORDER — OXYCODONE HCL 5 MG PO TABS: 10.0000 mg | ORAL_TABLET | ORAL | Status: DC | PRN

## 2020-10-29 MED ORDER — ACETAMINOPHEN 500 MG PO TABS
1000.0000 mg | ORAL_TABLET | Freq: Four times a day (QID) | ORAL | Status: DC
  Administered 2020-10-29 (×3): 1000 mg via ORAL
  Filled 2020-10-29 (×3): qty 2

## 2020-10-29 MED ORDER — OXYCODONE HCL 5 MG PO TABS
10.0000 mg | ORAL_TABLET | Freq: Four times a day (QID) | ORAL | Status: DC | PRN
  Administered 2020-10-29: 10 mg via ORAL
  Filled 2020-10-29: qty 2

## 2020-10-29 NOTE — Discharge Instructions (Signed)
[facility]  Please arrive at least 30 min before your appointment to complete your check in paperwork.  If you are unable to arrive 30 min prior to your appointment time we may have to cancel or reschedule you. LAPAROSCOPIC SURGERY: POST OP INSTRUCTIONS Always review your discharge instruction sheet given to you by the facility where your surgery was performed. IF YOU HAVE DISABILITY OR FAMILY LEAVE FORMS, YOU MUST BRING THEM TO THE OFFICE FOR PROCESSING.   DO NOT GIVE THEM TO YOUR DOCTOR.  PAIN CONTROL  1. First take acetaminophen (Tylenol) AND/or ibuprofen (Advil) to control your pain after surgery.  Follow directions on package.  Taking acetaminophen (Tylenol) and/or ibuprofen (Advil) regularly after surgery will help to control your pain and lower the amount of prescription pain medication you may need.  You should not take more than 4,000 mg (4 grams) of acetaminophen (Tylenol) in 24 hours.  You should not take ibuprofen (Advil), aleve, motrin, naprosyn or other NSAIDS if you have a history of stomach ulcers or chronic kidney disease.  2. A prescription for pain medication may be given to you upon discharge.  Take your pain medication as prescribed, if you still have uncontrolled pain after taking acetaminophen (Tylenol) or ibuprofen (Advil). 3. Use ice packs to help control pain. 4. If you need a refill on your pain medication, please contact your pharmacy.  They will contact our office to request authorization. Prescriptions will not be filled after 5pm or on week-ends.  HOME MEDICATIONS 5. Take your usually prescribed medications unless otherwise directed.  DIET 6. You should follow a light diet the first few days after arrival home.  Be sure to include lots of fluids daily. Avoid fatty, fried foods.   CONSTIPATION 7. It is common to experience some constipation after surgery and if you are taking pain medication.  Increasing fluid intake and taking a stool  softener (such as Colace) will usually help or prevent this problem from occurring.  A mild laxative (Milk of Magnesia or Miralax) should be taken according to package instructions if there are no bowel movements after 48 hours.  WOUND/INCISION CARE 8. Most patients will experience some swelling and bruising in the area of the incisions.  Ice packs will help.  Swelling and bruising can take several days to resolve.  9. Unless discharge instructions indicate otherwise, follow guidelines below  a. STERI-STRIPS - you may remove your outer bandages 48 hours after surgery, and you may shower at that time.  You have steri-strips (small skin tapes) in place directly over the incision.  These strips should be left on the skin for 7-10 days.   b. DERMABOND/SKIN GLUE - you may shower in 24 hours.  The glue will flake off over the next 2-3 weeks. 10. Any sutures or staples will be removed at the office during your follow-up visit.  ACTIVITIES 11. You may resume regular (light) daily activities beginning the next day--such as daily self-care, walking, climbing stairs--gradually increasing activities as tolerated.  You may have sexual intercourse when it is comfortable.  Refrain from any heavy lifting or straining until approved by your doctor. a. You may drive when you are no longer taking prescription pain medication, you can comfortably wear a seatbelt, and you can safely maneuver your car and apply brakes.  FOLLOW-UP 12. You should see your doctor in the office for a follow-up appointment approximately 2-3 weeks after your surgery.  You should have been given your post-op/follow-up appointment when   your surgery was scheduled.  If you did not receive a post-op/follow-up appointment, make sure that you call for this appointment within a day or two after you arrive home to insure a convenient appointment time.   WHEN TO CALL YOUR DOCTOR: 1. Fever over 101.0 2. Inability to urinate 3. Continued bleeding from  incision. 4. Increased pain, redness, or drainage from the incision. 5. Increasing abdominal pain  The clinic staff is available to answer your questions during regular business hours.  Please don't hesitate to call and ask to speak to one of the nurses for clinical concerns.  If you have a medical emergency, go to the nearest emergency room or call 911.  A surgeon from [facility] is always on call at the hospital. [address], [address], [place], [place]  [address] ? [address] [address], [place], [place]   [address] [phone] ? [phone] ? FAX [phone]   Gallbladder Eating Plan If you have a gallbladder condition, you may have trouble digesting fats. Eating a low-fat diet can help reduce your symptoms, and may be helpful before and after having surgery to remove your gallbladder (cholecystectomy). Your health care provider may recommend that you work with a diet and nutrition specialist (dietitian) to help you reduce the amount of fat in your diet. What are tips for following this plan? General guidelines  Limit your fat intake to less than 30% of your total daily calories. If you eat around 1,800 calories each day, this is less than 60 grams (g) of fat per day.  Fat is an important part of a healthy diet. Eating a low-fat diet can make it hard to maintain a healthy body weight. Ask your dietitian how much fat, calories, and other nutrients you need each day.  Eat small, frequent meals throughout the day instead of three large meals.  Drink at least 8-10 cups of fluid a day. Drink enough fluid to keep your urine clear or pale yellow.  Limit alcohol intake to no more than 1 drink a day for nonpregnant women and 2 drinks a day for men. One drink equals 12 oz of beer, 5 oz of wine, or 1 oz of hard liquor. Reading food labels  Check Nutrition Facts on food labels for the amount of fat per serving. Choose foods with less than 3 grams of fat per serving.    Shopping  Choose nonfat and low-fat healthy foods. Look for the words "nonfat," "low fat," or "fat free."  Avoid buying processed or prepackaged foods. Cooking  Cook using low-fat methods, such as baking, broiling, grilling, or boiling.  Cook with small amounts of healthy fats, such as olive oil, grapeseed oil, canola oil, or sunflower oil. What foods are recommended?  All fresh, frozen, or canned fruits and vegetables.  Whole grains.  Low-fat or non-fat (skim) milk and yogurt.  Lean meat, skinless poultry, fish, eggs, and beans.  Low-fat protein supplement powders or drinks.  Spices and herbs. What foods are not recommended?  High-fat foods. These include baked goods, fast food, fatty cuts of meat, ice cream, french toast, sweet rolls, pizza, cheese bread, foods covered with butter, creamy sauces, or cheese.  Fried foods. These include french fries, tempura, battered fish, breaded chicken, fried breads, and sweets.  Foods with strong odors.  Foods that cause bloating and gas. Summary  A low-fat diet can be helpful if you have a gallbladder condition, or before and after gallbladder surgery.  Limit your fat intake to   less than 30% of your total daily calories. This is about 60 g of fat if you eat 1,800 calories each day.  Eat small, frequent meals throughout the day instead of three large meals. This information is not intended to replace advice given to you by your health care provider. Make sure you discuss any questions you have with your health care provider. Document Revised: 03/11/2020 Document Reviewed: 03/11/2020 Elsevier Patient Education  2021 [employer]  

## 2020-10-29 NOTE — Progress Notes (Addendum)
2 Days Post-Op  Subjective: CC: HIDA negative yesterday. He is tolerating cld. Not really having much abdominal pain, some soreness of the right abdomen. Upset that his oxycodone was discontinued. Reports he takes 10mg  oxy q6hrs at home for chronic neck pain and follows with a pain clinic. Mobilizing without difficulty. Voiding.   Objective: Vital signs in last 24 hours: Temp:  [97.6 F (36.4 C)-98.7 F (37.1 C)] 97.6 F (36.4 C) (03/25 0519) Pulse Rate:  [59-60] 59 (03/25 0519) Resp:  [14-18] 17 (03/25 0519) BP: (139-156)/(66-98) 143/98 (03/25 0519) SpO2:  [94 %-96 %] 96 % (03/25 0519) Last BM Date: 10/26/20  Intake/Output from previous day: 03/24 0701 - 03/25 0700 In: 1682.9 [P.O.:180; I.V.:1502.9] Out: 1 [Urine:1] Intake/Output this shift: No intake/output data recorded.  PE: Gen:  Alert, NAD, pleasant HEENT: EOM's intact, pupils equal and round Card:  RRR Pulm:  CTA b/l Abd: Soft, mild distension, appropriately tender around laparoscopic incisions. No peritonitis. +BS. Incisions with glue intact appears well and are without drainage, bleeding, or signs of infection Ext:  No LE edema or calf tenderness Psych: A&Ox3  Skin: no rashes noted, warm and dry  Lab Results:  Recent Labs    10/28/20 0945 10/29/20 0142  WBC 15.7* 14.1*  HGB 14.1 14.8  HCT 40.4 43.7  PLT 163 151   BMET Recent Labs    10/27/20 0126 10/29/20 0142  NA 137 137  K 3.7 3.7  CL 107 105  CO2 21* 22  GLUCOSE 118* 140*  BUN 18 13  CREATININE 0.91 0.89  CALCIUM 8.6* 8.6*   PT/INR No results for input(s): LABPROT, INR in the last 72 hours. CMP     Component Value Date/Time   NA 137 10/29/2020 0142   NA 139 12/09/2013 1601   K 3.7 10/29/2020 0142   K 3.6 12/09/2013 1601   CL 105 10/29/2020 0142   CL 105 12/09/2013 1601   CO2 22 10/29/2020 0142   CO2 28 12/09/2013 1601   GLUCOSE 140 (H) 10/29/2020 0142   GLUCOSE 110 (H) 12/09/2013 1601   BUN 13 10/29/2020 0142   BUN 20 (H)  12/09/2013 1601   CREATININE 0.89 10/29/2020 0142   CREATININE 1.01 12/09/2013 1601   CALCIUM 8.6 (L) 10/29/2020 0142   CALCIUM 9.0 12/09/2013 1601   PROT 5.8 (L) 10/29/2020 0142   PROT 7.2 12/09/2013 1601   ALBUMIN 2.6 (L) 10/29/2020 0142   ALBUMIN 4.2 12/09/2013 1601   AST 137 (H) 10/29/2020 0142   AST 42 (H) 12/09/2013 1601   ALT 163 (H) 10/29/2020 0142   ALT 42 12/09/2013 1601   ALKPHOS 170 (H) 10/29/2020 0142   ALKPHOS 60 12/09/2013 1601   BILITOT 2.6 (H) 10/29/2020 0142   BILITOT 0.6 12/09/2013 1601   GFRNONAA >60 10/29/2020 0142   GFRNONAA >60 12/09/2013 1601   GFRAA >60 12/06/2017 1040   GFRAA >60 12/09/2013 1601   Lipase     Component Value Date/Time   LIPASE 39 10/28/2020 0945       Studies/Results: NM HEPATOBILIARY LEAK (POST-SURGICAL)  Result Date: 10/28/2020 CLINICAL DATA:  Recurrent abdominal pain post cholecystectomy. EXAM: NUCLEAR MEDICINE HEPATOBILIARY IMAGING TECHNIQUE: Sequential images of the abdomen were obtained out to 60 minutes following intravenous administration of radiopharmaceutical. RADIOPHARMACEUTICALS:  7.6 mCi Tc-36m  Choletec IV COMPARISON:  ERCP October 26, 2020. FINDINGS: Minimally prolonged blood pool activity with mildly delayed biliary excretion of activity by the liver. No scintigraphic findings to suggest biliary leak. Biliary activity passes  into small bowel, consistent with patent common bile duct. There is no evidence of delayed biliary to bowel transit, in there is persistent decrease in common duct activity during delayed imaging the after its peak at 50 minutes. Small amount of enterogastric biliary reflux visualized at 110-120 minutes on delayed imaging. IMPRESSION: No scintigraphic evidence of biliary leak or sphincter of Oddi dysfunction. Minimally prolonged blood pool activity and biliary clearance of activity by the liver suggestive of hepatocellular dysfunction. Small amount of enterogastric biliary reflux seen on delayed imaging  which is nonspecific and can be a normal finding but also can be indicative of alkaline gastritis. Electronically Signed   By: Maudry Mayhew MD   On: 10/28/2020 16:10    Anti-infectives: Anti-infectives (From admission, onward)   Start     Dose/Rate Route Frequency Ordered Stop   10/26/20 1400  cefTRIAXone (ROCEPHIN) 2 g in sodium chloride 0.9 % 100 mL IVPB  Status:  Discontinued        2 g 200 mL/hr over 30 Minutes Intravenous Every 24 hours 10/26/20 0750 10/27/20 1529   10/24/20 0600  ceFEPIme (MAXIPIME) 2 g in sodium chloride 0.9 % 100 mL IVPB  Status:  Discontinued        2 g 200 mL/hr over 30 Minutes Intravenous Every 8 hours 10/23/20 2230 10/26/20 0752   10/23/20 2215  ceFEPIme (MAXIPIME) 2 g in sodium chloride 0.9 % 100 mL IVPB        2 g 200 mL/hr over 30 Minutes Intravenous  Once 10/23/20 2122 10/23/20 2230   10/23/20 2200  metroNIDAZOLE (FLAGYL) IVPB 500 mg  Status:  Discontinued        500 mg 100 mL/hr over 60 Minutes Intravenous Every 8 hours 10/23/20 2100 10/27/20 1529       Assessment/Plan Symptomatic bradycardia s/p permanent pacemaker implantation HTN Chronic Pain - reports he follows with a pain clinic at home and takes oxy 10mg  q6hrs at home  Acute Cholecystitis- s/p Laparoscopic Cholecystectomy by Dr. , 3/23. POD #2 - HIDA negative. LFT's downtrending. T. Bili 2.6 from 5.3. Adv diet. Wean IV pain medications. Mobilize. Pulm toilet.  Choledocholithiasis - s/p ERCP and sphincterotomy 3/22 by Dr. 4/22 Biliarypancreatitis- Lipase normalized (85 > 3849 > 1055 > 41)   FEN -Reg, IVF per [facility] VTE -SCDs, okay for chemical prophyalxis from a general surgery standpoint ID -Cefepime/Flagyl 3/19 - 3/22. Rocephin 3/23 Plan - Restart home pain medication dosing. Adv diet. If patients tolerates diet and pain is well controlled on oral pain medication, can be d/c'd this pm from our standpoint. I reached out to [facility] to let them know. Will arrange follow up.    LOS:  6 days    [facility] , [facility] Surgery 10/29/2020, 8:08 AM Please see Amion for pager number during day hours 7:00am-4:30pm

## 2020-10-29 NOTE — Discharge Summary (Signed)
Physician Discharge Summary  Mark [name] XBJ:[id number] DOB: 27-Nov-1955 DOA: 10/23/2020  PCP: Ria Bush, MD  Admit date: 10/23/2020 Discharge date: 10/29/2020  Admitted From: Home Disposition:  Home  Discharge Condition:Stable CODE STATUS:FULL Diet recommendation: Heart Healthy  Brief/Interim Summary:  Patient is a 65 year old male with history of chronic pain syndrome, light sensitive, symptomatic bradycardia status post permanent pacemaker implantation, hyperlipidemia, hypertension who initially presented to [facility] with 3-day history of abdominal pain, nausea, vomiting.  He was found to have acute cholecystitis/possible choledocholithiasis, biliary pancreatitis with elevated lipase, liver enzymes.  Patient was transferred here for GI consult and possible ERCP.  General surgery also following.  He was started on antibiotics.Underwent  ERCP on 10/26/20 and lap chole on 10/27/20.   General surgery cleared him for discharge today.  Following problems were addressed during his hospitalization:  Acute cholecystitis/possible choledocholithiasis/biliary pancreatitis: Presented with abdominal pain, elevated liver enzymes, lipase.  Liver function was improving. GI and general surgery were following.  Underwent  laparoscopic cholecystectomy on 10/27/20  .Status post ERCP with biliary sphincterotomy on 10/26/20. Patient complained of increased right upper quadrant pain after surgery.  General surgery ordered HIDA scan, did not show any leak.  Abdomen pain is much better today.  No nausea or vomiting.  Tolerating diet. He still has some mild leukocytosis.  We recommend to check a CBC in a week.  Hypertension: Currently blood pressure stable.  Bilateral lower extremity edema: resolved. On Lasix 20 mg daily at home.    Symptomatic bradycardia: Status post permanent pacemaker implantation.   Chronic pain syndrome: On oxycodone at home.  Hyperlipidemia: On Lipitor at home which we  recommend to hold because of elevated liver enzymes.  Can restart it after liver enzymes improve  History of depression: On fluoxetine  OSA: on CPAP  Positive stool Cologuard: Has family history of colon cancer.  GI recommended surveillance colonoscopy .    Discharge Diagnoses:  Principal Problem:   Choledocholithiasis Active Problems:   Chronic pain syndrome   History of permanent cardiac pacemaker placement   Obstructive sleep apnea   Acute cholecystitis   Acute gallstone pancreatitis    Discharge Instructions  Discharge Instructions    Diet - low sodium heart healthy   Complete by: As directed    Discharge instructions   Complete by: As directed    1) Please follow up with your PCP in a week.Do a CBC and CMP tests during the follow up 2)Follow with general surgery on 11/18/2020.  You have an appointment at 1: 30 pm 3)We are currently recommending not to take your cholesterol medication because you have elevated liver enzymes.  Do a CMP test when you follow-up with your PCP to check your liver enzymes   Increase activity slowly   Complete by: As directed    No wound care   Complete by: As directed      Allergies as of 10/29/2020      Reactions   Ciprofloxacin Other (See Comments)   PANIC ATTACKS (Fluoroquinolones)   Metformin Other (See Comments)   Bad acid reflux   Buprenorphine Hcl Nausea Only   Morphine And Related Nausea Only   Penicillins Rash   Rash Has patient had a PCN reaction causing immediate rash, facial/tongue/throat swelling, SOB or lightheadedness with hypotension: Unknown Has patient had a PCN reaction causing severe rash involving mucus membranes or skin necrosis: Unknown Has patient had a PCN reaction that required hospitalization: No Has patient had a PCN reaction occurring within the  last 10 years: No CHILDHOOD REACTION. If all of the above answers are "NO", then may proceed with Cephalosporin use.      Medication List    STOP taking  these medications   atorvastatin 20 MG tablet Commonly known as: LIPITOR     TAKE these medications   aspirin 81 MG tablet Take 81 mg by mouth daily.   CertaVite Senior/Antioxidant Tabs Take 1 tablet by mouth daily.   cetirizine 10 MG tablet Commonly known as: ZYRTEC Take 10 mg by mouth at bedtime.   CRANBERRY PO Take 4,200 mg by mouth at bedtime.   cyclobenzaprine 10 MG tablet Commonly known as: FLEXERIL TAKE 1 TABLET BY MOUTH TWICE DAILY AS NEEDED FOR MUSCLE SPASMS GENERIC EQUIVALENT FOR FLEXERIL   dicyclomine 20 MG tablet Commonly known as: BENTYL Take 20 mg by mouth 2 (two) times daily as needed for spasms.   diphenhydrAMINE 50 MG capsule Commonly known as: BENADRYL Take 50 mg by mouth at bedtime.   ferrous sulfate 325 (65 FE) MG EC tablet Take 325 mg by mouth daily with breakfast.   FLUoxetine 20 MG capsule Commonly known as: PROZAC Take 1 capsule (20 mg total) by mouth daily. What changed: when to take this   FLUoxetine 40 MG capsule Commonly known as: PROZAC Take 1 capsule (40 mg total) by mouth daily. What changed: when to take this   fluticasone 50 MCG/ACT nasal spray Commonly known as: FLONASE Place 2 sprays into both nostrils daily.   furosemide 20 MG tablet Commonly known as: LASIX Take 1 tablet (20 mg total) by mouth daily.   Garlic 2248 MG Caps Take 1,000 mg by mouth daily.   Melatonin 10 MG Tabs Take 10 mg by mouth at bedtime.   naproxen 500 MG tablet Commonly known as: NAPROSYN Take 1 tablet (500 mg total) by mouth 2 (two) times daily as needed for moderate pain.   Narcan 4 MG/0.1ML Liqd nasal spray kit Generic drug: naloxone Spray into one nostril. Repeat with second device into other nostril after 2-3 minutes if no or minimal response.   Omega-3 1000 MG Caps Take 1,000 mg by mouth at bedtime.   omeprazole 20 MG capsule Commonly known as: PRILOSEC Take 1 capsule (20 mg total) by mouth daily as needed (GERD).   Oxycodone HCl 10  MG Tabs Take 1 tablet (10 mg total) by mouth every 6 (six) hours as needed. Must last 30 days Start taking on: November 11, 2020 What changed: reasons to take this   potassium citrate 10 MEQ (1080 MG) SR tablet Commonly known as: UROCIT-K Take 1 tablet (10 mEq total) by mouth 2 (two) times daily.   rOPINIRole 1 MG tablet Commonly known as: REQUIP Take 1 tablet (1 mg total) by mouth at bedtime.   SAW PALMETTO PO Take 450 mg by mouth 2 (two) times daily.   sildenafil 20 MG tablet Commonly known as: REVATIO Take 1 tablet (20 mg total) by mouth 3 (three) times daily.   Vitamin B Complex-C Caps Take 1 capsule by mouth daily.   vitamin C 1000 MG tablet Take 1,000 mg by mouth daily.   Vitamin D 50 MCG (2000 UT) Caps Take 2,000 Units by mouth daily.   vitamin E 1000 UNIT capsule Take 1,000 Units by mouth daily.       Follow-up Information    Ria Bush, MD Follow up.   Specialty: Family Medicine Contact information: [address] [place] [address] [phone]  Surgery, [place]. Go on 11/18/2020.   Specialty: General Surgery Why: 04/14 at 1:30 pm. Please bring a copy of your photo ID and insurance card. Please arrive 30 minutes prior to your appointment for paperwork.  Contact information: [address] [place] [address] [phone]              Allergies  Allergen Reactions  . Ciprofloxacin Other (See Comments)    PANIC ATTACKS (Fluoroquinolones)   . Metformin Other (See Comments)    Bad acid reflux  . Buprenorphine Hcl Nausea Only  . Morphine And Related Nausea Only  . Penicillins Rash    Rash Has patient had a PCN reaction causing immediate rash, facial/tongue/throat swelling, SOB or lightheadedness with hypotension: Unknown Has patient had a PCN reaction causing severe rash involving mucus membranes or skin necrosis: Unknown Has patient had a PCN reaction that required hospitalization: No Has patient had  a PCN reaction occurring within the last 10 years: No CHILDHOOD REACTION. If all of the above answers are "NO", then may proceed with Cephalosporin use.     Consultations:  GI,Surgery   Procedures/Studies: DG ERCP BILIARY & PANCREATIC DUCTS  Result Date: 10/26/2020 CLINICAL DATA:  Choledocholithiasis. EXAM: ERCP TECHNIQUE: Multiple spot images obtained with the fluoroscopic device and submitted for interpretation post-procedure. COMPARISON:  None. FINDINGS: Imaging performed with a C-arm demonstrates cannulation of the common bile duct with contrast injection demonstrating no evidence of significant biliary dilatation or significant visualized filling defects. Contrast does reflux through the cystic duct and partially enters the gallbladder. A balloon sweep maneuver was performed. IMPRESSION: No discrete biliary filling defects identified on the cholangiogram images. Balloon sweep maneuver was performed in the common bile duct. These images were submitted for radiologic interpretation only. Please see the procedural report for the amount of contrast and the fluoroscopy time utilized. Electronically Signed   By: Aletta Edouard M.D.   On: 10/26/2020 15:29   NM HEPATOBILIARY LEAK (POST-SURGICAL)  Result Date: 10/28/2020 CLINICAL DATA:  Recurrent abdominal pain post cholecystectomy. EXAM: NUCLEAR MEDICINE HEPATOBILIARY IMAGING TECHNIQUE: Sequential images of the abdomen were obtained out to 60 minutes following intravenous administration of radiopharmaceutical. RADIOPHARMACEUTICALS:  7.6 mCi Tc-55m Choletec IV COMPARISON:  ERCP October 26, 2020. FINDINGS: Minimally prolonged blood pool activity with mildly delayed biliary excretion of activity by the liver. No scintigraphic findings to suggest biliary leak. Biliary activity passes into small bowel, consistent with patent common bile duct. There is no evidence of delayed biliary to bowel transit, in there is persistent decrease in common duct activity  during delayed imaging the after its peak at 50 minutes. Small amount of enterogastric biliary reflux visualized at 110-120 minutes on delayed imaging. IMPRESSION: No scintigraphic evidence of biliary leak or sphincter of Oddi dysfunction. Minimally prolonged blood pool activity and biliary clearance of activity by the liver suggestive of hepatocellular dysfunction. Small amount of enterogastric biliary reflux seen on delayed imaging which is nonspecific and can be a normal finding but also can be indicative of alkaline gastritis. Electronically Signed   By: JDahlia BailiffMD   On: 10/28/2020 16:10      Subjective: Patient seen and examined the bedside this morning.  Hemodynamically stable for discharge.  Discharge Exam: Vitals:   10/28/20 2028 10/29/20 0519  BP: 139/66 (!) 143/98  Pulse: 60 (!) 59  Resp: 16 17  Temp: 98.7 F (37.1 C) 97.6 F (36.4 C)  SpO2: 94% 96%   Vitals:   10/28/20 1030 10/28/20  1532 10/28/20 2028 10/29/20 0519  BP: (!) 145/74 (!) 156/82 139/66 (!) 143/98  Pulse: 60 60 60 (!) 59  Resp: 18 14 16 17   Temp: 98.5 F (36.9 C) 98.3 F (36.8 C) 98.7 F (37.1 C) 97.6 F (36.4 C)  TempSrc: Oral Oral    SpO2: 96% 95% 94% 96%  Weight:      Height:        General: Pt is alert, awake, not in acute distress Cardiovascular: RRR, S1/S2 +, no rubs, no gallops Respiratory: CTA bilaterally, no wheezing, no rhonchi Abdominal: Soft, NT, ND, bowel sounds + Extremities: no edema, no cyanosis    The results of significant diagnostics from this hospitalization (including imaging, microbiology, ancillary and laboratory) are listed below for reference.     Microbiology: Recent Results (from the past 240 hour(s))  SARS CORONAVIRUS 2 (TAT 6-24 HRS) Nasopharyngeal Nasopharyngeal Swab     Status: None   Collection Time: 10/23/20 11:08 PM   Specimen: Nasopharyngeal Swab  Result Value Ref Range Status   SARS Coronavirus 2 NEGATIVE NEGATIVE Final    Comment:  (NOTE) SARS-CoV-2 target nucleic acids are NOT DETECTED.  The SARS-CoV-2 RNA is generally detectable in upper and lower respiratory specimens during the acute phase of infection. Negative results do not preclude SARS-CoV-2 infection, do not rule out co-infections with other pathogens, and should not be used as the sole basis for treatment or other patient management decisions. Negative results must be combined with clinical observations, patient history, and epidemiological information. The expected result is Negative.  Fact Sheet for Patients: SugarRoll.be  Fact Sheet for Healthcare Providers: https://www.woods-mathews.com/  This test is not yet approved or cleared by the [place] FDA and  has been authorized for detection and/or diagnosis of SARS-CoV-2 by FDA under an Emergency Use Authorization (EUA). This EUA will remain  in effect (meaning this test can be used) for the duration of the COVID-19 declaration under Se ction 564(b)(1) of the Act, 21 U.S.C. section 360bbb-3(b)(1), unless the authorization is terminated or revoked sooner.  Performed at [facility], [place] [address] [place], [place] [address]   Surgical pcr screen     Status: Abnormal   Collection Time: 10/26/20 11:34 AM   Specimen: Nasal Mucosa; Nasal Swab  Result Value Ref Range Status   MRSA, PCR POSITIVE (A) NEGATIVE Final    Comment: RESULT CALLED TO, READ BACK BY AND VERIFIED WITH: RN LOURDES A. 2000 FCP    Staphylococcus aureus POSITIVE (A) NEGATIVE Final    Comment: (NOTE) The Xpert SA Assay (FDA approved for NASAL specimens in patients 33 years of age and older), is one component of a comprehensive surveillance program. It is not intended to diagnose infection nor to guide or monitor treatment. Performed at [facility], [place] [address], [place], [place] [address]   SARS CORONAVIRUS 2 (TAT 6-24 HRS) Nasopharyngeal Nasopharyngeal Swab      Status: None   Collection Time: 10/26/20 12:30 PM   Specimen: Nasopharyngeal Swab  Result Value Ref Range Status   SARS Coronavirus 2 NEGATIVE NEGATIVE Final    Comment: (NOTE) SARS-CoV-2 target nucleic acids are NOT DETECTED.  The SARS-CoV-2 RNA is generally detectable in upper and lower respiratory specimens during the acute phase of infection. Negative results do not preclude SARS-CoV-2 infection, do not rule out co-infections with other pathogens, and should not be used as the sole basis for treatment or other patient management decisions. Negative results must be combined with clinical observations,  patient history, and epidemiological information. The expected result is Negative.  Fact Sheet for Patients: SugarRoll.be  Fact Sheet for Healthcare Providers: https://www.woods-mathews.com/  This test is not yet approved or cleared by the [place] FDA and  has been authorized for detection and/or diagnosis of SARS-CoV-2 by FDA under an Emergency Use Authorization (EUA). This EUA will remain  in effect (meaning this test can be used) for the duration of the COVID-19 declaration under Se ction 564(b)(1) of the Act, 21 U.S.C. section 360bbb-3(b)(1), unless the authorization is terminated or revoked sooner.  Performed at [facility], [place] [address], [place], [place] [address]      Labs: BNP (last 3 results) No results for input(s): BNP in the last 8760 hours. Basic Metabolic Panel: Recent Labs  Lab 10/24/20 0049 10/25/20 0442 10/26/20 0219 10/27/20 0126 10/29/20 0142  NA 141 136 138 137 137  K 4.1 4.8 3.6 3.7 3.7  CL 109 109 107 107 105  CO2 21* 19* 23 21* 22  GLUCOSE 111* 95 83 118* 140*  BUN 19 16 15 18 13   CREATININE 0.98 0.88 0.98 0.91 0.89  CALCIUM 8.7* 8.3* 8.7* 8.6* 8.6*   Liver Function Tests: Recent Labs  Lab 10/24/20 0049 10/25/20 0442 10/26/20 0219 10/27/20 0126 10/29/20 0142  AST 202* 189*  133* 137* 137*  ALT 223* 182* 167* 157* 163*  ALKPHOS 138* 140* 143* 189* 170*  BILITOT 7.7* 4.4* 2.8* 5.3* 2.6*  PROT 5.5* 4.7* 5.3* 5.4* 5.8*  ALBUMIN 2.8* 2.5* 2.6* 2.6* 2.6*   Recent Labs  Lab 10/25/20 0442 10/28/20 0945  LIPASE 41 39   No results for input(s): AMMONIA in the last 168 hours. CBC: Recent Labs  Lab 10/24/20 0049 10/25/20 0442 10/26/20 0219 10/28/20 0945 10/29/20 0142  WBC 8.4 7.5 9.2 15.7* 14.1*  NEUTROABS  --  5.5 6.2  --   --   HGB 13.8 13.0 14.1 14.1 14.8  HCT 39.2 35.6* 38.7* 40.4 43.7  MCV 86.9 86.0 86.4 89.4 91.6  PLT 133* 118* 135* 163 151   Cardiac Enzymes: No results for input(s): CKTOTAL, CKMB, CKMBINDEX, TROPONINI in the last 168 hours. BNP: Invalid input(s): POCBNP CBG: No results for input(s): GLUCAP in the last 168 hours. D-Dimer No results for input(s): DDIMER in the last 72 hours. Hgb A1c No results for input(s): HGBA1C in the last 72 hours. Lipid Profile No results for input(s): CHOL, HDL, LDLCALC, TRIG, CHOLHDL, LDLDIRECT in the last 72 hours. Thyroid function studies No results for input(s): TSH, T4TOTAL, T3FREE, THYROIDAB in the last 72 hours.  Invalid input(s): FREET3 Anemia work up No results for input(s): VITAMINB12, FOLATE, FERRITIN, TIBC, IRON, RETICCTPCT in the last 72 hours. Urinalysis No results found for: COLORURINE, APPEARANCEUR, [place], [place], [place], [place], [place], [place], PROTEINUR, UROBILINOGEN, NITRITE, LEUKOCYTESUR Sepsis Labs Invalid input(s): PROCALCITONIN,  WBC,  LACTICIDVEN Microbiology Recent Results (from the past 240 hour(s))  SARS CORONAVIRUS 2 (TAT 6-24 HRS) Nasopharyngeal Nasopharyngeal Swab     Status: None   Collection Time: 10/23/20 11:08 PM   Specimen: Nasopharyngeal Swab  Result Value Ref Range Status   SARS Coronavirus 2 NEGATIVE NEGATIVE Final    Comment: (NOTE) SARS-CoV-2 target nucleic acids are NOT DETECTED.  The SARS-CoV-2 RNA is generally detectable in upper and  lower respiratory specimens during the acute phase of infection. Negative results do not preclude SARS-CoV-2 infection, do not rule out co-infections with other pathogens, and should not be used as the sole basis for treatment or other patient  management decisions. Negative results must be combined with clinical observations, patient history, and epidemiological information. The expected result is Negative.  Fact Sheet for Patients: SugarRoll.be  Fact Sheet for Healthcare Providers: https://www.woods-mathews.com/  This test is not yet approved or cleared by the [place] FDA and  has been authorized for detection and/or diagnosis of SARS-CoV-2 by FDA under an Emergency Use Authorization (EUA). This EUA will remain  in effect (meaning this test can be used) for the duration of the COVID-19 declaration under Se ction 564(b)(1) of the Act, 21 U.S.C. section 360bbb-3(b)(1), unless the authorization is terminated or revoked sooner.  Performed at [facility], [place] [address], [place], [place] [address]   Surgical pcr screen     Status: Abnormal   Collection Time: 10/26/20 11:34 AM   Specimen: Nasal Mucosa; Nasal Swab  Result Value Ref Range Status   MRSA, PCR POSITIVE (A) NEGATIVE Final    Comment: RESULT CALLED TO, READ BACK BY AND VERIFIED WITH: RN LOURDES A. 2000 FCP    Staphylococcus aureus POSITIVE (A) NEGATIVE Final    Comment: (NOTE) The Xpert SA Assay (FDA approved for NASAL specimens in patients 22 years of age and older), is one component of a comprehensive surveillance program. It is not intended to diagnose infection nor to guide or monitor treatment. Performed at [facility], [place] [address] [place], [place] [address]   SARS CORONAVIRUS 2 (TAT 6-24 HRS) Nasopharyngeal Nasopharyngeal Swab     Status: None   Collection Time: 10/26/20 12:30 PM   Specimen: Nasopharyngeal Swab  Result Value Ref Range Status    SARS Coronavirus 2 NEGATIVE NEGATIVE Final    Comment: (NOTE) SARS-CoV-2 target nucleic acids are NOT DETECTED.  The SARS-CoV-2 RNA is generally detectable in upper and lower respiratory specimens during the acute phase of infection. Negative results do not preclude SARS-CoV-2 infection, do not rule out co-infections with other pathogens, and should not be used as the sole basis for treatment or other patient management decisions. Negative results must be combined with clinical observations, patient history, and epidemiological information. The expected result is Negative.  Fact Sheet for Patients: SugarRoll.be  Fact Sheet for Healthcare Providers: https://www.woods-mathews.com/  This test is not yet approved or cleared by the [place] FDA and  has been authorized for detection and/or diagnosis of SARS-CoV-2 by FDA under an Emergency Use Authorization (EUA). This EUA will remain  in effect (meaning this test can be used) for the duration of the COVID-19 declaration under Se ction 564(b)(1) of the Act, 21 U.S.C. section 360bbb-3(b)(1), unless the authorization is terminated or revoked sooner.  Performed at [facility], [place] [address] [place], [place] [address]     Please note: You were cared for by a hospitalist during your hospital stay. Once you are discharged, your primary care physician will handle any further medical issues. Please note that NO REFILLS for any discharge medications will be authorized once you are discharged, as it is imperative that you return to your primary care physician (or establish a relationship with a primary care physician if you do not have one) for your post hospital discharge needs so that they can reassess your need for medications and monitor your lab values.    Time coordinating discharge: 40 minutes  SIGNED:   Shelly Coss, MD  [facility] 10/29/2020, 1:27 PM Pager  [phone]  If 7PM-7AM, please contact night-coverage [email or web address] Password [email or web address]

## 2020-10-30 NOTE — Anesthesia Postprocedure Evaluation (Signed)
Anesthesia Post Note  Patient: Mark [name]  Procedure(s) Performed: LAPAROSCOPIC CHOLECYSTECTOMY (N/A Abdomen)     Patient location during evaluation: PACU Anesthesia Type: General Level of consciousness: awake Pain management: pain level controlled Vital Signs Assessment: post-procedure vital signs reviewed and stable Respiratory status: spontaneous breathing Cardiovascular status: stable Postop Assessment: no apparent nausea or vomiting Anesthetic complications: no   No complications documented.  Last Vitals:  Vitals:   10/29/20 0519 10/29/20 1337  BP: (!) 143/98 (!) 146/69  Pulse: (!) 59 60  Resp: 17   Temp: 36.4 C 36.6 C  SpO2: 96% 97%    Last Pain:  Vitals:   10/29/20 0904  TempSrc:   PainSc: 4                  John F Scharlene Corn

## 2020-11-01 ENCOUNTER — Telehealth: Payer: Self-pay

## 2020-11-01 NOTE — Telephone Encounter (Signed)
Transition Care Management Follow-up Telephone Call  Date of discharge and from where: [facility] on 10/29/20  How have you been since you were released from the hospital? Doing ok since pain has decreased. Current pain level is a 3 in his right abdomen. Pt states the nausea and vomiting has subsided. Pt states the four incision sites look good and not infected. Pt did not have a good nights rest due to girlfriend passing. Appetite has decreased due to stress. Pt is stiff, sore and wore out. Declines pain elsewhere, SOB, indigestion, fever or diarrhea.  Any questions or concerns? No   Items Reviewed:  Did the pt receive and understand the discharge instructions provided? Yes   Medications obtained and verified? No, declined verifying at this time.  Any new allergies since your discharge? No   Dietary orders reviewed? Yes  Do you have support at home? Lives at home with mother but pt is helping take care of her.  Other (ie: DME, Home Health, etc): N/A  Functional Questionnaire: (I = Independent and D = Dependent)  Bathing/Dressing- I   Meal Prep- I  Eating- I  Maintaining continence- I  Transferring/Ambulation- I  Managing Meds- I   Follow up appointments reviewed:    PCP Hospital f/u appt confirmed? No, pt declined scheduling a HFU with PCP at this time. Pt will call with any concerns and questions.    Specialist Hospital f/u appt confirmed? Yes   Are transportation arrangements needed? No   If their condition worsens, is the pt aware to call  their PCP or go to the ED? Yes  Was the patient provided with contact information for the PCP's office or ED? Yes  Was the pt encouraged to call back with questions or concerns? Yes

## 2020-11-04 NOTE — Telephone Encounter (Signed)
Pt should keep f/u with surgeon.

## 2020-12-07 NOTE — Progress Notes (Signed)
PROVIDER NOTE: Information contained herein reflects review and annotations entered in association with encounter. Interpretation of such information and data should be left to medically-trained personnel. Information provided to patient can be located elsewhere in the medical record under "Patient Instructions". Document created using STT-dictation technology, any transcriptional errors that may result from process are unintentional.    Patient: Mark [name]  Service Category: E/M  Provider: Gaspar Cola, MD  DOB: 1955-12-03  DOS: 12/08/2020  Specialty: Interventional Pain Management  MRN: [id number]  Setting: Ambulatory outpatient  PCP: Ria Bush, MD  Type: Established Patient    Referring Provider: Ria Bush, MD  Location: Office  Delivery: Face-to-face     HPI  Mark [name], a 65 y.o. year old male, is here today because of his Chronic pain syndrome [G89.4]. Mark [name] primary complain today is Neck Pain (Biilateral ) and Shoulder Pain (Left ) Last encounter: My last encounter with him was on 09/08/2020. Pertinent problems: 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); Cervicogenic headache; Cervical spondylosis with radiculopathy (Bilateral) (L>R); Cervical facet syndrome (Bilateral) (L>R); Cervical foraminal stenosis (Severe C6-7) (Left); Chronic sacroiliac joint pain (Bilateral); Osteoarthritis of sacroiliac joint (Bilateral); Lumbar facet hypertrophy (L1-2, L2-3, and L4-5) (Bilateral); Lumbar IVDD (intervertebral disc displacement); 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); Lumbar facet syndrome (Bilateral); Osteoarthritis of shoulder (Left); Osteoarthritis of knee (Bilateral) (R>L); Chronic cervical radicular pain; Chronic  fatigue; RLS (restless legs syndrome); Trigger finger of index finger (Right); Cervicalgia; Trigger finger of thumb (Right); Chronic thumb pain (Left); Trigger middle finger of right hand; Chronic hand pain (Left); Osteoarthritis of first carpometacarpal joint (thumb) of hand (Left); Trigger point with back pain (Left); Trigger point of shoulder region (scapula) (Left); and History of laparoscopic cholecystectomy (10/27/2020) on their pertinent problem list. Pain Assessment: Severity of Chronic pain is reported as a 4 /10. Location: Neck (shoulder) Left,Right (left)/at times neck pain goes into arms and from the left shoulder to the right shoulder. Onset: More than a month ago. Quality: Discomfort,Constant,Dull,Aching. Timing: Constant. Modifying factor(s): medications, rest. Vitals:  height is $RemoveB'5\' 7"'MPJXTXan$  (1.702 m) and weight is 215 lb (97.5 kg). His temporal temperature is 97.1 F (36.2 C) (abnormal). His blood pressure is 138/79 and his pulse is 64. His respiration is 16 and oxygen saturation is 98%.   Reason for encounter: medication management.   The patient indicates doing well with the current medication regimen. No adverse reactions or side effects reported to the medications.  The patient recently had a laparoscopic cholecystectomy secondary to acute cholecystitis.  He indicates that he was kept in the hospital for several days due to inability to control the pain.  He indicated that he was not really getting any pain medication while he was there.  RTCB: 03/17/2021  Pharmacotherapy Assessment   Analgesic: Oxycodone IR 10 mg, 1 tab PO q 6 hrs (40 mg/day total of oxycodone) MME/day:60 mg/day.   Monitoring: [place] PMP: PDMP reviewed during this encounter.       Pharmacotherapy: No side-effects or adverse reactions reported. Compliance: No problems identified. Effectiveness: Clinically acceptable.  Janett Billow, RN  12/08/2020 12:58 PM  Sign when Signing Visit Nursing Pain Medication  Assessment:  Safety precautions to be maintained throughout the outpatient stay will include: orient to surroundings, keep bed in low position, maintain  call bell within reach at all times, provide assistance with transfer out of bed and ambulation.  Medication Inspection Compliance: Pill count conducted under aseptic conditions, in front of the patient. Neither the pills nor the bottle was removed from the patient's sight at any time. Once count was completed pills were immediately returned to the patient in their original bottle.  Medication: Oxycodone IR Pill/Patch Count: 36 of 120 pills remain Pill/Patch Appearance: Markings consistent with prescribed medication Bottle Appearance: Standard pharmacy container. Clearly labeled. Filled Date: 04 / 07 / 2022 Last Medication intake:  Today    UDS:  Summary  Date Value Ref Range Status  01/06/2020 Note  Final    Comment:    ==================================================================== Tox[employer] Select 13 (MW) ==================================================================== Test                             Result       Flag       Units Drug Present and Declared for Prescription Verification   Oxycodone                      2773         EXPECTED   ng/mg creat   Noroxycodone                   3993         EXPECTED   ng/mg creat    Sources of oxycodone include scheduled prescription medications.    Noroxycodone is an expected metabolite of oxycodone. ==================================================================== Test                      Result    Flag   Units      Ref Range   Creatinine              44               mg/dL      >=20 ==================================================================== Declared Medications:  The flagging and interpretation on this report are based on the  following declared medications.  Unexpected results may arise from  inaccuracies in the declared medications.  **Note: The testing scope of  this panel includes these medications:  Oxycodone  **Note: The testing scope of this panel does not include the  following reported medications:  Fluoxetine (Prozac)  Fluticasone (Flonase)  Furosemide (Lasix)  Melatonin  Multivitamin  Naloxone (Narcan)  Naproxen  Omega-3 Fatty Acids  Omeprazole (Prilosec)  Potassium  Sildenafil  Supplement  Vitamin E ==================================================================== For clinical consultation, please call [phone]. ====================================================================      ROS  Constitutional: Denies any fever or chills Gastrointestinal: No reported hemesis, hematochezia, vomiting, or acute GI distress Musculoskeletal: Denies any acute onset joint swelling, redness, loss of ROM, or weakness Neurological: No reported episodes of acute onset apraxia, aphasia, dysarthria, agnosia, amnesia, paralysis, loss of coordination, or loss of consciousness  Medication Review  CertaVite Senior/Antioxidant, Cranberry, FLUoxetine, Garlic, Melatonin, Omega-3, Oxycodone HCl, Saw Palmetto (Serenoa repens), Vitamin B Complex-C, Vitamin D, aspirin, atorvastatin, cetirizine, cyclobenzaprine, dicyclomine, diphenhydrAMINE, ferrous sulfate, fluticasone, furosemide, naloxone, naproxen, omeprazole, potassium citrate, rOPINIRole, vitamin C, and vitamin E  History Review  Allergy: Mark [name] is allergic to ciprofloxacin, metformin, buprenorphine hcl, morphine and related, and penicillins. Drug: Mark [name]  reports no history of drug use. Alcohol:  reports no history of alcohol use. Tobacco:  reports that he has never smoked. He has never used  smokeless tobacco. Social: Mark [name]  reports that he has never smoked. He has never used smokeless tobacco. He reports that he does not drink alcohol and does not use drugs. Medical:  has a past medical history of Anxiety, Arthritis, Chronic pain, Depression, Heart murmur, History of kidney stones  (April 12,2017), History of permanent cardiac pacemaker placement (05/31/2015), Hydrocele in adult (05/07/2015), Kidney stones, Migraine, MRSA carrier (2019), Presence of permanent cardiac pacemaker, Prostatitis, and Sleep apnea. Surgical: Mark [name]  has a past surgical history that includes Tonsillectomy (1964); Carpal tunnel release (Bilateral, 2006); Ulnar nerve repair (2008); Cervical spine surgery (2008); Rotator cuff repair (Left, 2009); pacemaker placement (2009); Lithotripsy (Bilateral, 2012); Lithotripsy (Left, 11/2015); Cardiovascular stress test (2014); Colonoscopy (07/2007); implantable cardioverter defibrillator (icd) generator change (Left, 12/13/2017); pace maker revision (12/13/2017); Laparoscopic cholecystectomy (10/2020); Cholecystectomy (N/A, 10/27/2020); ERCP (N/A, 10/26/2020); Esophagogastroduodenoscopy (egd) with propofol (N/A, 10/26/2020); EUS (10/26/2020); Sphincterotomy (10/26/2020); and removal of stones (10/26/2020). Family: family history includes Arthritis in his father and mother; Cancer in his father and mother; Diabetes in his mother; Heart block in his father; Heart disease in his father and mother.  Laboratory Chemistry Profile   Renal Lab Results  Component Value Date   BUN 13 10/29/2020   CREATININE 0.89 10/29/2020   GFR 91.41 09/07/2020   GFRAA >60 12/06/2017   GFRNONAA >60 10/29/2020     Hepatic Lab Results  Component Value Date   AST 137 (H) 10/29/2020   ALT 163 (H) 10/29/2020   ALBUMIN 2.6 (L) 10/29/2020   ALKPHOS 170 (H) 10/29/2020   HCVAB NEGATIVE 07/22/2013   LIPASE 39 10/28/2020     Electrolytes Lab Results  Component Value Date   NA 137 10/29/2020   K 3.7 10/29/2020   CL 105 10/29/2020   CALCIUM 8.6 (L) 10/29/2020   MG 2.1 09/01/2015     Bone Lab Results  Component Value Date   25OHVITD1 63 02/02/2016   25OHVITD2 <1.0 02/02/2016   25OHVITD3 63 02/02/2016   TESTOSTERONE 216.29 (L) 02/13/2017     Inflammation (CRP: Acute Phase) (ESR:  Chronic Phase) Lab Results  Component Value Date   CRP 1.7 (H) 09/01/2015   ESRSEDRATE 6 09/01/2015       Note: Above Lab results reviewed.  Recent Imaging Review  NM HEPATOBILIARY LEAK (POST-SURGICAL) CLINICAL DATA:  Recurrent abdominal pain post cholecystectomy.  EXAM: NUCLEAR MEDICINE HEPATOBILIARY IMAGING  TECHNIQUE: Sequential images of the abdomen were obtained out to 60 minutes following intravenous administration of radiopharmaceutical.  RADIOPHARMACEUTICALS:  7.6 mCi Tc-72m Choletec IV  COMPARISON:  ERCP October 26, 2020.  FINDINGS: Minimally prolonged blood pool activity with mildly delayed biliary excretion of activity by the liver. No scintigraphic findings to suggest biliary leak. Biliary activity passes into small bowel, consistent with patent common bile duct. There is no evidence of delayed biliary to bowel transit, in there is persistent decrease in common duct activity during delayed imaging the after its peak at 50 minutes. Small amount of enterogastric biliary reflux visualized at 110-120 minutes on delayed imaging.  IMPRESSION: No scintigraphic evidence of biliary leak or sphincter of Oddi dysfunction.  Minimally prolonged blood pool activity and biliary clearance of activity by the liver suggestive of hepatocellular dysfunction.  Small amount of enterogastric biliary reflux seen on delayed imaging which is nonspecific and can be a normal finding but also can be indicative of alkaline gastritis.  Electronically Signed   By: JDahlia BailiffMD   On: 10/28/2020 16:10 Note: Reviewed  Physical Exam  General appearance: Well nourished, well developed, and well hydrated. In no apparent acute distress Mental status: Alert, oriented x 3 (person, place, & time)       Respiratory: No evidence of acute respiratory distress Eyes: PERLA Vitals: BP 138/79 (BP Location: Right Arm, Patient Position: Sitting, Cuff Size: Normal)   Pulse 64   Temp (!)  97.1 F (36.2 C) (Temporal)   Resp 16   Ht 5' 7"  (1.702 m)   Wt 215 lb (97.5 kg)   SpO2 98%   BMI 33.67 kg/m  BMI: Estimated body mass index is 33.67 kg/m as calculated from the following:   Height as of this encounter: 5' 7"  (1.702 m).   Weight as of this encounter: 215 lb (97.5 kg). Ideal: Ideal body weight: 66.1 kg (145 lb 11.6 oz) Adjusted ideal body weight: 78.7 kg (173 lb 6.9 oz)  Assessment   Status Diagnosis  Controlled Controlled Controlled 1. Chronic pain syndrome   2. Chronic neck pain (1ry area of Pain) (Bilateral) (L>R)   3. 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 Pharmacologic therapy   5. Chronic use of opiate for therapeutic purpose   6. Uncomplicated opioid dependence ([place])   7. History of laparoscopic cholecystectomy (10/27/2020)      Updated Problems: Problem  History of laparoscopic cholecystectomy (10/27/2020)  Osa (Obstructive Sleep Apnea)   Formatting of this note might be different from the original. Last Assessment & Plan:  On BiPAP   Hld (Hyperlipidemia)   Formatting of this note might be different from the original. Last Assessment & Plan:  Chronic, stable on lipitor - continue. The ASCVD Risk score Mikey Bussing DC Jr., et al., 2013) failed to calculate for the following reasons:   The valid total cholesterol range is 130 to 320 mg/dL   Acute Cholecystitis  Pacemaker  Chronic Diastolic Heart Failure (Hcc)  Prediabetes  Serum Lipase Elevation  Transaminasemia    Plan of Care  Problem-specific:  No problem-specific Assessment & Plan notes found for this encounter.  Mark [name] has a current medication list which includes the following long-term medication(s): cetirizine, dicyclomine, diphenhydramine, fluoxetine, fluoxetine, fluticasone, furosemide, omeprazole, ropinirole, [START ON 12/17/2020] oxycodone hcl, [START ON 01/16/2021] oxycodone hcl, and [START ON 02/15/2021] oxycodone hcl.  Pharmacotherapy (Medications  Ordered): Meds ordered this encounter  Medications  . Oxycodone HCl 10 MG TABS    Sig: Take 1 tablet (10 mg total) by mouth every 6 (six) hours as needed. Must last 30 days.    Dispense:  120 tablet    Refill:  0    Not a duplicate. Do NOT delete! Dispense 1 day early if closed on refill date. Avoid benzodiazepines within 8 hours of opioids. Do not send refill requests.  . Oxycodone HCl 10 MG TABS    Sig: Take 1 tablet (10 mg total) by mouth every 6 (six) hours as needed. Must last 30 days.    Dispense:  120 tablet    Refill:  0    Not a duplicate. Do NOT delete! Dispense 1 day early if closed on refill date. Avoid benzodiazepines within 8 hours of opioids. Do not send refill requests.  . Oxycodone HCl 10 MG TABS    Sig: Take 1 tablet (10 mg total) by mouth every 6 (six) hours as needed. Must last 30 days.    Dispense:  120 tablet    Refill:  0    Not a duplicate. Do NOT delete! Dispense  1 day early if closed on refill date. Avoid benzodiazepines within 8 hours of opioids. Do not send refill requests.   Orders:  Orders Placed This Encounter  Procedures  . ToxASSU[employer] Select 13 (MW), Urine    Volume: 30 ml(s). Minimum 3 ml of urine is needed. Document temperature of fresh sample. Indications: Long term (current) use of opiate analgesic (O48.592)    Order Specific Question:   Release to patient    Answer:   Immediate   Follow-up plan:   Return in about 3 months (around 03/17/2021) for (F2F), (MM).      Considering:  Diagnostic right hand digit 2 trigger finger injection Therapeutic bilateral IA Hyalgan knee injections  Diagnostic bilateral cervical facet block  Possible bilateral cervical facet RFA  Diagnostic left sided CESI  Diagnostic bilateral IA knee injection Diagnostic bilateral genicular NB Possible bilateral genicular nerve RFA  Diagnostic bilateral sacroiliac joint block Possible bilateral sacroiliac joint RFA  Diagnostic bilateral IA shoulder jointinjection   Diagnostic bilateral suprascapular NB Possible bilateral suprascapular nerve RFA  Diagnostic bilateral lumbar facet block Possible bilateral lumbar facet RFA  Diagnostic left L4-5 LESI    Palliative PRN treatment(s):  Bilateral intra-articular Hyalgan knee injectionseries (injection #4)  Diagnostic bilateral cervical facetblock  Diagnostic left sided CESI  Diagnostic bilateral IA knee injection Diagnostic bilateral genicular NB Diagnostic bilateral sacroiliac joint block Diagnostic bilateral IA shoulder joint injection  Diagnostic bilateral suprascapularNB  Diagnostic bilateral lumbar facetblock  Diagnostic left L4-5 LESI         Recent Visits No visits were found meeting these conditions. Showing recent visits within past 90 days and meeting all other requirements Today's Visits Date Type Provider Dept  12/08/20 Office Visit Milinda Pointer, MD [facility]  Showing today's visits and meeting all other requirements Future Appointments No visits were found meeting these conditions. Showing future appointments within next 90 days and meeting all other requirements  I discussed the assessment and treatment plan with the patient. The patient was provided an opportunity to ask questions and all were answered. The patient agreed with the plan and demonstrated an understanding of the instructions.  Patient advised to call back or seek an in-person evaluation if the symptoms or condition worsens.  Duration of encounter: 30 minutes.  Note by: Gaspar Cola, MD Date: 12/08/2020; Time: 4:05 PM

## 2020-12-08 ENCOUNTER — Other Ambulatory Visit: Payer: Self-pay

## 2020-12-08 ENCOUNTER — Encounter: Payer: Self-pay | Admitting: Pain Medicine

## 2020-12-08 ENCOUNTER — Ambulatory Visit: Payer: Medicare Other | Attending: Pain Medicine | Admitting: Pain Medicine

## 2020-12-08 VITALS — BP 138/79 | HR 64 | Temp 97.1°F | Resp 16 | Ht 67.0 in | Wt 215.0 lb

## 2020-12-08 DIAGNOSIS — Z79891 Long term (current) use of opiate analgesic: Secondary | ICD-10-CM | POA: Diagnosis not present

## 2020-12-08 DIAGNOSIS — M542 Cervicalgia: Secondary | ICD-10-CM | POA: Diagnosis not present

## 2020-12-08 DIAGNOSIS — M961 Postlaminectomy syndrome, not elsewhere classified: Secondary | ICD-10-CM | POA: Diagnosis not present

## 2020-12-08 DIAGNOSIS — F1120 Opioid dependence, uncomplicated: Secondary | ICD-10-CM | POA: Diagnosis not present

## 2020-12-08 DIAGNOSIS — G8929 Other chronic pain: Secondary | ICD-10-CM | POA: Insufficient documentation

## 2020-12-08 DIAGNOSIS — Z79899 Other long term (current) drug therapy: Secondary | ICD-10-CM | POA: Diagnosis not present

## 2020-12-08 DIAGNOSIS — G894 Chronic pain syndrome: Secondary | ICD-10-CM | POA: Insufficient documentation

## 2020-12-08 DIAGNOSIS — Z9049 Acquired absence of other specified parts of digestive tract: Secondary | ICD-10-CM | POA: Diagnosis not present

## 2020-12-08 MED ORDER — OXYCODONE HCL 10 MG PO TABS: 10.0000 mg | ORAL_TABLET | Freq: Four times a day (QID) | ORAL | 0 refills | Status: DC | PRN

## 2020-12-08 NOTE — Progress Notes (Signed)
Nursing Pain Medication Assessment:  Safety precautions to be maintained throughout the outpatient stay will include: orient to surroundings, keep bed in low position, maintain call bell within reach at all times, provide assistance with transfer out of bed and ambulation.  Medication Inspection Compliance: Pill count conducted under aseptic conditions, in front of the patient. Neither the pills nor the bottle was removed from the patient's sight at any time. Once count was completed pills were immediately returned to the patient in their original bottle.  Medication: Oxycodone IR Pill/Patch Count: 36 of 120 pills remain Pill/Patch Appearance: Markings consistent with prescribed medication Bottle Appearance: Standard pharmacy container. Clearly labeled. Filled Date: 04 / 07 / 2022 Last Medication intake:  Today

## 2020-12-08 NOTE — Patient Instructions (Signed)
____________________________________________________________________________________________  Medication Rules  Purpose: To inform patients, and their family members, of our rules and regulations.  Applies to: All patients receiving prescriptions (written or electronic).  Pharmacy of record: Pharmacy where electronic prescriptions will be sent. If written prescriptions are taken to a different pharmacy, please inform the nursing staff. The pharmacy listed in the electronic medical record should be the one where you would like electronic prescriptions to be sent.  Electronic prescriptions: In compliance with the [place] Strengthen Opioid Misuse Prevention (STOP) Act of 2017 (Session Law 2017-74/H243), effective August 07, 2018, all controlled substances must be electronically prescribed. Calling prescriptions to the pharmacy will cease to exist.  Prescription refills: Only during scheduled appointments. Applies to all prescriptions.  NOTE: The following applies primarily to controlled substances (Opioid* Pain Medications).   Type of encounter (visit): For patients receiving controlled substances, face-to-face visits are required. (Not an option or up to the patient.)  Patient's responsibilities: 1. Pain Pills: Bring all pain pills to every appointment (except for procedure appointments). 2. Pill Bottles: Bring pills in original pharmacy bottle. Always bring the newest bottle. Bring bottle, even if empty. 3. Medication refills: You are responsible for knowing and keeping track of what medications you take and those you need refilled. The day before your appointment: write a list of all prescriptions that need to be refilled. The day of the appointment: give the list to the admitting nurse. Prescriptions will be written only during appointments. No prescriptions will be written on procedure days. If you forget a medication: it will not be "Called in", "Faxed", or "electronically sent".  You will need to get another appointment to get these prescribed. No early refills. Do not call asking to have your prescription filled early. 4. Prescription Accuracy: You are responsible for carefully inspecting your prescriptions before leaving our office. Have the discharge nurse carefully go over each prescription with you, before taking them home. Make sure that your name is accurately spelled, that your address is correct. Check the name and dose of your medication to make sure it is accurate. Check the number of pills, and the written instructions to make sure they are clear and accurate. Make sure that you are given enough medication to last until your next medication refill appointment. 5. Taking Medication: Take medication as prescribed. When it comes to controlled substances, taking less pills or less frequently than prescribed is permitted and encouraged. Never take more pills than instructed. Never take medication more frequently than prescribed.  6. Inform other Doctors: Always inform, all of your healthcare providers, of all the medications you take. 7. Pain Medication from other Providers: You are not allowed to accept any additional pain medication from any other Doctor or Healthcare provider. There are two exceptions to this rule. (see below) In the event that you require additional pain medication, you are responsible for notifying us, as stated below. 8. Cough Medicine: Often these contain an opioid, such as codeine or hydrocodone. Never accept or take cough medicine containing these opioids if you are already taking an opioid* medication. The combination may cause respiratory failure and death. 9. Medication Agreement: You are responsible for carefully reading and following our Medication Agreement. This must be signed before receiving any prescriptions from our practice. Safely store a copy of your signed Agreement. Violations to the Agreement will result in no further prescriptions.  (Additional copies of our Medication Agreement are available upon request.) 10. Laws, Rules, & Regulations: All patients are expected to follow all   Federal and State Laws, Statutes, Rules, & Regulations. Ignorance of the Laws does not constitute a valid excuse.  11. Illegal drugs and Controlled Substances: The use of illegal substances (including, but not limited to marijuana and its derivatives) and/or the illegal use of any controlled substances is strictly prohibited. Violation of this rule may result in the immediate and permanent discontinuation of any and all prescriptions being written by our practice. The use of any illegal substances is prohibited. 12. Adopted CDC guidelines & recommendations: Target dosing levels will be at or below 60 MME/day. Use of benzodiazepines** is not recommended.  Exceptions: There are only two exceptions to the rule of not receiving pain medications from other Healthcare Providers. 1. Exception #1 (Emergencies): In the event of an emergency (i.e.: accident requiring emergency care), you are allowed to receive additional pain medication. However, you are responsible for: As soon as you are able, call our office [phone], at any time of the day or night, and leave a message stating your name, the date and nature of the emergency, and the name and dose of the medication prescribed. In the event that your call is answered by a member of our staff, make sure to document and save the date, time, and the name of the person that took your information.  2. Exception #2 (Planned Surgery): In the event that you are scheduled by another doctor or dentist to have any type of surgery or procedure, you are allowed (for a period no longer than 30 days), to receive additional pain medication, for the acute post-op pain. However, in this case, you are responsible for picking up a copy of our "Post-op Pain Management for Surgeons" handout, and giving it to your surgeon or dentist. This  document is available at our office, and does not require an appointment to obtain it. Simply go to our office during business hours (Monday-Thursday from 8:00 AM to 4:00 PM) (Friday 8:00 AM to 12:00 Noon) or if you have a scheduled appointment with us, prior to your surgery, and ask for it by name. In addition, you are responsible for: calling our office [phone], at any time of the day or night, and leaving a message stating your name, name of your surgeon, type of surgery, and date of procedure or surgery. Failure to comply with your responsibilities may result in termination of therapy involving the controlled substances.  *Opioid medications include: morphine, codeine, oxycodone, oxymorphone, hydrocodone, hydromorphone, meperidine, tramadol, tapentadol, buprenorphine, fentanyl, methadone. **Benzodiazepine medications include: diazepam (Valium), alprazolam (Xanax), clonazepam (Klonopine), lorazepam (Ativan), clorazepate (Tranxene), chlordiazepoxide (Librium), estazolam (Prosom), oxazepam (Serax), temazepam (Restoril), triazolam (Halcion) (Last updated: 07/05/2020) ____________________________________________________________________________________________   ____________________________________________________________________________________________  Medication Recommendations and Reminders  Applies to: All patients receiving prescriptions (written and/or electronic).  Medication Rules & Regulations: These rules and regulations exist for your safety and that of others. They are not flexible and neither are we. Dismissing or ignoring them will be considered "non-compliance" with medication therapy, resulting in complete and irreversible termination of such therapy. (See document titled "Medication Rules" for more details.) In all conscience, because of safety reasons, we cannot continue providing a therapy where the patient does not follow instructions.  Pharmacy of record:   Definition:  This is the pharmacy where your electronic prescriptions will be sent.   We do not endorse any particular pharmacy, however, we have experienced problems with [facility] not securing enough medication supply for the community.  We do not restrict you in your choice of pharmacy. However,   once we write for your prescriptions, we will NOT be re-sending more prescriptions to fix restricted supply problems created by your pharmacy, or your insurance.   The pharmacy listed in the electronic medical record should be the one where you want electronic prescriptions to be sent.  If you choose to change pharmacy, simply notify our nursing staff.  Recommendations:  Keep all of your pain medications in a safe place, under lock and key, even if you live alone. We will NOT replace lost, stolen, or damaged medication.  After you fill your prescription, take 1 week's worth of pills and put them away in a safe place. You should keep a separate, properly labeled bottle for this purpose. The remainder should be kept in the original bottle. Use this as your primary supply, until it runs out. Once it's gone, then you know that you have 1 week's worth of medicine, and it is time to come in for a prescription refill. If you do this correctly, it is unlikely that you will ever run out of medicine.  To make sure that the above recommendation works, it is very important that you make sure your medication refill appointments are scheduled at least 1 week before you run out of medicine. To do this in an effective manner, make sure that you do not leave the office without scheduling your next medication management appointment. Always ask the nursing staff to show you in your prescription , when your medication will be running out. Then arrange for the receptionist to get you a return appointment, at least 7 days before you run out of medicine. Do not wait until you have 1 or 2 pills left, to come in. This is very poor planning and  does not take into consideration that we may need to cancel appointments due to bad weather, sickness, or emergencies affecting our staff.  DO NOT ACCEPT A "Partial Fill": If for any reason your pharmacy does not have enough pills/tablets to completely fill or refill your prescription, do not allow for a "partial fill". The law allows the pharmacy to complete that prescription within 72 hours, without requiring a new prescription. If they do not fill the rest of your prescription within those 72 hours, you will need a separate prescription to fill the remaining amount, which we will NOT provide. If the reason for the partial fill is your insurance, you will need to talk to the pharmacist about payment alternatives for the remaining tablets, but again, DO NOT ACCEPT A PARTIAL FILL, unless you can trust your pharmacist to obtain the remainder of the pills within 72 hours.  Prescription refills and/or changes in medication(s):   Prescription refills, and/or changes in dose or medication, will be conducted only during scheduled medication management appointments. (Applies to both, written and electronic prescriptions.)  No refills on procedure days. No medication will be changed or started on procedure days. No changes, adjustments, and/or refills will be conducted on a procedure day. Doing so will interfere with the diagnostic portion of the procedure.  No phone refills. No medications will be "called into the pharmacy".  No Fax refills.  No weekend refills.  No Holliday refills.  No after hours refills.  Remember:  Business hours are:  Monday to Thursday 8:00 AM to 4:00 PM Provider's Schedule: Kerra Guilfoil, MD - Appointments are:  Medication management: Monday and Wednesday 8:00 AM to 4:00 PM Procedure day: Tuesday and Thursday 7:30 AM to 4:00 PM Bilal Lateef, MD - Appointments are:    Medication management: Tuesday and Thursday 8:00 AM to 4:00 PM Procedure day: Monday and Wednesday  7:30 AM to 4:00 PM (Last update: 02/25/2020) ____________________________________________________________________________________________   ____________________________________________________________________________________________  CBD (cannabidiol) WARNING  Applicable to: All individuals currently taking or considering taking CBD (cannabidiol) and, more important, all patients taking opioid analgesic controlled substances (pain medication). (Example: oxycodone; oxymorphone; hydrocodone; hydromorphone; morphine; methadone; tramadol; tapentadol; fentanyl; buprenorphine; butorphanol; dextromethorphan; meperidine; codeine; etc.)  Legal status: CBD remains a Schedule I drug prohibited for any use. CBD is illegal with one exception. In the [place], CBD has a limited Food and Drug Administration (FDA) approval for the treatment of two specific types of epilepsy disorders. Only one CBD product has been approved by the FDA for this purpose: "Epidiolex". FDA is aware that some companies are marketing products containing cannabis and cannabis-derived compounds in ways that violate the Federal Food, Drug and Cosmetic Act (FD&C Act) and that may put the health and safety of consumers at risk. The FDA, a Federal agency, has not enforced the CBD status since 2018.   Legality: Some manufacturers ship CBD products nationally, which is illegal. Often such products are sold online and are therefore available throughout the country. CBD is openly sold in head shops and health food stores in some states where such sales have not been explicitly legalized. Selling unapproved products with unsubstantiated therapeutic claims is not only a violation of the law, but also can put patients at risk, as these products have not been proven to be safe or effective. Federal illegality makes it difficult to conduct research on CBD.  Reference: "FDA Regulation of Cannabis and Cannabis-Derived Products, Including Cannabidiol  (CBD)" - https://www.fda.gov/news-events/public-health-focus/fda-regulation-cannabis-and-cannabis-derived-products-including-cannabidiol-cbd  Warning: CBD is not FDA approved and has not undergo the same manufacturing controls as prescription drugs.  This means that the purity and safety of available CBD may be questionable. Most of the time, despite manufacturer's claims, it is contaminated with THC (delta-9-tetrahydrocannabinol - the chemical in marijuana responsible for the "HIGH").  When this is the case, the THC contaminant will trigger a positive urine drug screen (UDS) test for Marijuana (carboxy-THC). Because a positive UDS for any illicit substance is a violation of our medication agreement, your opioid analgesics (pain medicine) may be permanently discontinued.  MORE ABOUT CBD  General Information: CBD  is a derivative of the Marijuana (cannabis sativa) plant discovered in 1940. It is one of the 113 identified substances found in Marijuana. It accounts for up to 40% of the plant's extract. As of 2018, preliminary clinical studies on CBD included research for the treatment of anxiety, movement disorders, and pain. CBD is available and consumed in multiple forms, including inhalation of smoke or vapor, as an aerosol spray, and by mouth. It may be supplied as an oil containing CBD, capsules, dried cannabis, or as a liquid solution. CBD is thought not to be as psychoactive as THC (delta-9-tetrahydrocannabinol - the chemical in marijuana responsible for the "HIGH"). Studies suggest that CBD may interact with different biological target receptors in the body, including cannabinoid and other neurotransmitter receptors. As of 2018 the mechanism of action for its biological effects has not been determined.  Side-effects  Adverse reactions: Dry mouth, diarrhea, decreased appetite, fatigue, drowsiness, malaise, weakness, sleep disturbances, and others.  Drug interactions: CBC may interact with other  medications such as blood-thinners. (Last update: 03/13/2020) ____________________________________________________________________________________________    

## 2020-12-10 ENCOUNTER — Other Ambulatory Visit: Payer: Self-pay

## 2020-12-10 DIAGNOSIS — G894 Chronic pain syndrome: Secondary | ICD-10-CM

## 2020-12-10 NOTE — Telephone Encounter (Signed)
Flexeril last rx:  07/09/20, #40/0 Naproxen last rx:  08/12/20, #90/0 Last OV:  09/14/20, CPE Next OV:  none

## 2020-12-14 DIAGNOSIS — I495 Sick sinus syndrome: Secondary | ICD-10-CM | POA: Diagnosis not present

## 2020-12-14 MED ORDER — CYCLOBENZAPRINE HCL 10 MG PO TABS: 5.0000 mg | ORAL_TABLET | Freq: Two times a day (BID) | ORAL | 0 refills | Status: DC | PRN

## 2020-12-14 MED ORDER — NAPROXEN 500 MG PO TABS: 500.0000 mg | ORAL_TABLET | Freq: Two times a day (BID) | ORAL | 0 refills | Status: DC | PRN

## 2020-12-14 NOTE — Telephone Encounter (Signed)
ERx 

## 2020-12-16 ENCOUNTER — Other Ambulatory Visit: Payer: Self-pay | Admitting: Family Medicine

## 2020-12-16 MED ORDER — POTASSIUM CITRATE ER 10 MEQ (1080 MG) PO TBCR: 10.0000 meq | EXTENDED_RELEASE_TABLET | Freq: Two times a day (BID) | ORAL | 3 refills | Status: DC

## 2020-12-17 LAB — TOXASSURE SELECT 13 (MW), URINE

## 2020-12-22 ENCOUNTER — Telehealth: Payer: Self-pay

## 2020-12-22 NOTE — Telephone Encounter (Signed)
Received faxed Non-Formulary Exception form from [facility] for cyclobenzaprine 10 mg tab.  Placed form in Dr. Timoteo Expose box.

## 2020-12-22 NOTE — Telephone Encounter (Signed)
Filled and in Lisa's box 

## 2020-12-22 NOTE — Telephone Encounter (Signed)
ERROR

## 2020-12-23 NOTE — Telephone Encounter (Signed)
Faxed form.  Decision pending.  

## 2021-01-04 ENCOUNTER — Other Ambulatory Visit: Payer: Self-pay | Admitting: Family Medicine

## 2021-01-04 DIAGNOSIS — G894 Chronic pain syndrome: Secondary | ICD-10-CM

## 2021-01-05 NOTE — Telephone Encounter (Signed)
Called patient does need refill. States in past had gotten for 90 days would like to change back to that.

## 2021-01-07 NOTE — Telephone Encounter (Signed)
ERx 

## 2021-02-14 ENCOUNTER — Other Ambulatory Visit: Payer: Self-pay | Admitting: Family Medicine

## 2021-02-14 DIAGNOSIS — G894 Chronic pain syndrome: Secondary | ICD-10-CM

## 2021-03-07 ENCOUNTER — Other Ambulatory Visit: Payer: Self-pay | Admitting: Family Medicine

## 2021-03-07 NOTE — Telephone Encounter (Signed)
Flexeril Last filled:  12/24/20, #40 Last OV:  09/14/20, AWV prt 2 Next OV:  none

## 2021-03-14 ENCOUNTER — Other Ambulatory Visit: Payer: Self-pay | Admitting: Pain Medicine

## 2021-03-14 DIAGNOSIS — G894 Chronic pain syndrome: Secondary | ICD-10-CM

## 2021-03-14 DIAGNOSIS — Z79899 Other long term (current) drug therapy: Secondary | ICD-10-CM

## 2021-03-14 DIAGNOSIS — Z79891 Long term (current) use of opiate analgesic: Secondary | ICD-10-CM

## 2021-03-14 MED ORDER — OXYCODONE HCL 10 MG PO TABS: 10.0000 mg | ORAL_TABLET | Freq: Four times a day (QID) | ORAL | 0 refills | Status: DC | PRN

## 2021-03-14 NOTE — Progress Notes (Deleted)
No show to appointment.

## 2021-03-16 ENCOUNTER — Encounter: Payer: Medicare Other | Admitting: Pain Medicine

## 2021-03-16 DIAGNOSIS — M25512 Pain in left shoulder: Secondary | ICD-10-CM | POA: Insufficient documentation

## 2021-03-16 DIAGNOSIS — M5030 Other cervical disc degeneration, unspecified cervical region: Secondary | ICD-10-CM | POA: Insufficient documentation

## 2021-03-16 DIAGNOSIS — G8929 Other chronic pain: Secondary | ICD-10-CM | POA: Insufficient documentation

## 2021-03-20 NOTE — Progress Notes (Signed)
PROVIDER NOTE: Information contained herein reflects review and annotations entered in association with encounter. Interpretation of such information and data should be left to medically-trained personnel. Information provided to patient can be located elsewhere in the medical record under "Patient Instructions". Document created using STT-dictation technology, any transcriptional errors that may result from process are unintentional.    Patient: Mark [name]  Service Category: E/M  Provider: Gaspar Cola, MD  DOB: 20-Feb-1956  DOS: 03/23/2021  Specialty: Interventional Pain Management  MRN: [id number]  Setting: Ambulatory outpatient  PCP: Mark Bush, MD  Type: Established Patient    Referring Provider: Ria Bush, MD  Location: Office  Delivery: Face-to-face     HPI  [name], a 65 y.o. year old male, is here today because of his Chronic pain syndrome [G89.4]. [name] primary complain today is Neck Pain, Shoulder Pain, and Arm Pain (Left - to left hand; at times includes left fingers) Last encounter: My last encounter with him was on 03/16/2021. Pertinent problems: 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); Cervicogenic headache; Cervical spondylosis with radiculopathy (Bilateral) (L>R); Cervical facet syndrome (Bilateral) (L>R); Cervical foraminal stenosis (Severe C6-7) (Left); Chronic sacroiliac joint pain (Bilateral); Osteoarthritis of sacroiliac joint (Bilateral); Lumbar facet hypertrophy (L1-2, L2-3, and L4-5) (Bilateral); Lumbar IVDD (intervertebral disc displacement); 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); Lumbar facet syndrome (Bilateral); Osteoarthritis of shoulder (Left); Osteoarthritis of knee (Bilateral) (R>L); Chronic cervical radicular pain;  Chronic fatigue; RLS (restless legs syndrome); Trigger finger of index finger (Right); Cervicalgia; Trigger finger of thumb (Right); Chronic thumb pain (Left); Trigger middle finger of right hand; Chronic hand pain (Left); Osteoarthritis of first carpometacarpal joint (thumb) of hand (Left); Trigger point with back pain (Left); Trigger point of shoulder region (scapula) (Left); History of laparoscopic cholecystectomy (10/27/2020); DDD (degenerative disc disease), cervical; Chronic low back pain (Bilateral) w/o sciatica; and Chronic shoulder pain (Bilateral) (L>R) on their pertinent problem list. Pain Assessment: Severity of Chronic pain is reported as a 3 /10. Location: Neck Left/to left shoulder and down left arm to hand, sometimes includes left fingers; when pain at its worst, may radiate to right shoulder.. Onset: More than a month ago. Quality: Aching, Sharp, Shooting. Timing: Constant. Modifying factor(s): meds. Vitals:  height is _0  (1.702 m) and weight is 215 lb (97.5 kg). His temporal temperature is 97 F (36.1 C) (abnormal). His blood pressure is 144/84 (abnormal) and his pulse is 62. His respiration is 16 and oxygen saturation is 98%.   Reason for encounter: medication management.   The patient indicates doing well with the current medication regimen. No adverse reactions or side effects reported to the medications.   The patient did mention some appointments where he was no-show.  Today I have reminded him that my practice as it supersaturation and he may be booked up to 3 months in advance.  I reminded him to please make an effort to make those appointments and if he is not going to be able to do so, to call us with more than 24 hours in advance so that we can offer that appointment to somebody else.  I reminded him that 1 day he may be that someone else.  Understood and accepted.  RTCB: 06/22/2021  Pharmacotherapy Assessment  Analgesic: Oxycodone IR 10 mg, 1 tab PO q 6 hrs (40 mg/day  total  of oxycodone) MME/day: 60 mg/day.   Monitoring: [place] PMP: PDMP reviewed during this encounter.       Pharmacotherapy: No side-effects or adverse reactions reported. Compliance: No problems identified. Effectiveness: Clinically acceptable.  Rise Patience, RN  03/23/2021  8:13 AM  Sign when Signing Visit Nursing Pain Medication Assessment:  Safety precautions to be maintained throughout the outpatient stay will include: orient to surroundings, keep bed in low position, maintain call bell within reach at all times, provide assistance with transfer out of bed and ambulation.  Medication Inspection Compliance: Pill count conducted under aseptic conditions, in front of the patient. Neither the pills nor the bottle was removed from the patient's sight at any time. Once count was completed pills were immediately returned to the patient in their original bottle.  Medication:  Oxycodone 10 mg Pill/Patch Count:  07 of 120 pills remain Pill/Patch Appearance: Markings consistent with prescribed medication Bottle Appearance: Standard pharmacy container. Clearly labeled. Filled Date: 07 / 12 / 2022 Last Medication intake:  Today    UDS:  Summary  Date Value Ref Range Status  12/08/2020 Note  Final    Comment:    ==================================================================== [employer] [facility] 13 (MW) ==================================================================== Test                             Result       Flag       Units  Drug Present and Declared for Prescription Verification   Oxycodone                      1357         EXPECTED   ng/mg creat   Oxymorphone                    128          EXPECTED   ng/mg creat   Noroxycodone                   3434         EXPECTED   ng/mg creat    Sources of oxycodone include scheduled prescription medications.    Oxymorphone and noroxycodone are expected metabolites of oxycodone.    Oxymorphone is also available as a scheduled prescription  medication.  ==================================================================== Test                      Result    Flag   Units      Ref Range   Creatinine              68               mg/dL      >=20 ==================================================================== Declared Medications:  The flagging and interpretation on this report are based on the  following declared medications.  Unexpected results may arise from  inaccuracies in the declared medications.   **Note: The testing scope of this panel includes these medications:   Oxycodone   **Note: The testing scope of this panel does not include the  following reported medications:   Aspirin  Atorvastatin  Cetirizine (Zyrtec)  Cranberry  Cyclobenzaprine (Flexeril)  Dicyclomine (Bentyl)  Diphenhydramine (Benadryl)  Fluoxetine (Prozac)  Fluticasone (Flonase)  Iron  Melatonin  Multivitamin  Naloxone (Narcan)  Naproxen  Omega-3 Fatty Acids  Omeprazole  Potassium  Ropinirole  Supplement  Vitamin B  Vitamin C  Vitamin E ====================================================================  For clinical consultation, please call [phone]. ====================================================================      ROS  Constitutional: Denies any fever or chills Gastrointestinal: No reported hemesis, hematochezia, vomiting, or acute GI distress Musculoskeletal: Denies any acute onset joint swelling, redness, loss of ROM, or weakness Neurological: No reported episodes of acute onset apraxia, aphasia, dysarthria, agnosia, amnesia, paralysis, loss of coordination, or loss of consciousness  Medication Review  [employer] Senior/Antioxidant, Cranberry, FLUoxetine, Garlic, Melatonin, Omega-3, Oxycodone HCl, Saw Palmetto (Serenoa repens), Vitamin B Complex-C, Vitamin D, aspirin, atorvastatin, cetirizine, cyclobenzaprine, dicyclomine, diphenhydrAMINE, ferrous sulfate, fluticasone, furosemide, naloxone, naproxen,  omeprazole, potassium citrate, rOPINIRole, vitamin C, and vitamin E  History Review  Allergy: Mr. Pillsbury is allergic to ciprofloxacin, metformin, buprenorphine hcl, morphine and related, and penicillins. Drug: Mr. Ozment  reports no history of drug use. Alcohol:  reports no history of alcohol use. Tobacco:  reports that he has never smoked. He has never used smokeless tobacco. Social: Mr. Kalisz  reports that he has never smoked. He has never used smokeless tobacco. He reports that he does not drink alcohol and does not use drugs. Medical:  has a past medical history of Anxiety, Arthritis, Chronic pain, Depression, Heart murmur, History of kidney stones (April 12,2017), History of permanent cardiac pacemaker placement (05/31/2015), Hydrocele in adult (05/07/2015), Kidney stones, Migraine, MRSA carrier (2019), Presence of permanent cardiac pacemaker, Prostatitis, and Sleep apnea. Surgical: Mr. Dittus  has a past surgical history that includes Tonsillectomy (1964); Carpal tunnel release (Bilateral, 2006); Ulnar nerve repair (2008); Cervical spine surgery (2008); Rotator cuff repair (Left, 2009); pacemaker placement (2009); Lithotripsy (Bilateral, 2012); Lithotripsy (Left, 11/2015); Cardiovascular stress test (2014); Colonoscopy (07/2007); implantable cardioverter defibrillator (icd) generator change (Left, 12/13/2017); pace maker revision (12/13/2017); Laparoscopic cholecystectomy (10/2020); Cholecystectomy (N/A, 10/27/2020); ERCP (N/A, 10/26/2020); Esophagogastroduodenoscopy (egd) with propofol (N/A, 10/26/2020); EUS (10/26/2020); Sphincterotomy (10/26/2020); and removal of stones (10/26/2020). Family: family history includes Arthritis in his father and mother; Cancer in his father and mother; Diabetes in his mother; Heart block in his father; Heart disease in his father and mother.  Laboratory Chemistry Profile   Renal Lab Results  Component Value Date   BUN 13 10/29/2020   CREATININE 0.89 10/29/2020   GFR  91.41 09/07/2020   GFRAA >60 12/06/2017   GFRNONAA >60 10/29/2020    Hepatic Lab Results  Component Value Date   AST 137 (H) 10/29/2020   ALT 163 (H) 10/29/2020   ALBUMIN 2.6 (L) 10/29/2020   ALKPHOS 170 (H) 10/29/2020   HCVAB NEGATIVE 07/22/2013   LIPASE 39 10/28/2020    Electrolytes Lab Results  Component Value Date   NA 137 10/29/2020   K 3.7 10/29/2020   CL 105 10/29/2020   CALCIUM 8.6 (L) 10/29/2020   MG 2.1 09/01/2015    Bone Lab Results  Component Value Date   25OHVITD1 63 02/02/2016   25OHVITD2 <1.0 02/02/2016   25OHVITD3 63 02/02/2016   TESTOSTERONE 216.29 (L) 02/13/2017    Inflammation (CRP: Acute Phase) (ESR: Chronic Phase) Lab Results  Component Value Date   CRP 1.7 (H) 09/01/2015   ESRSEDRATE 6 09/01/2015         Note: Above Lab results reviewed.  Recent Imaging Review  NM HEPATOBILIARY LEAK (POST-SURGICAL) CLINICAL DATA:  Recurrent abdominal pain post cholecystectomy.  EXAM: NUCLEAR MEDICINE HEPATOBILIARY IMAGING  TECHNIQUE: Sequential images of the abdomen were obtained out to 60 minutes following intravenous administration of radiopharmaceutical.  RADIOPHARMACEUTICALS:  7.6 mCi Tc-31m Choletec IV  COMPARISON:  ERCP October 26, 2020.  FINDINGS: Minimally prolonged  blood pool activity with mildly delayed biliary excretion of activity by the liver. No scintigraphic findings to suggest biliary leak. Biliary activity passes into small bowel, consistent with patent common bile duct. There is no evidence of delayed biliary to bowel transit, in there is persistent decrease in common duct activity during delayed imaging the after its peak at 50 minutes. Small amount of enterogastric biliary reflux visualized at 110-120 minutes on delayed imaging.  IMPRESSION: No scintigraphic evidence of biliary leak or sphincter of Oddi dysfunction.  Minimally prolonged blood pool activity and biliary clearance of activity by the liver suggestive of  hepatocellular dysfunction.  Small amount of enterogastric biliary reflux seen on delayed imaging which is nonspecific and can be a normal finding but also can be indicative of alkaline gastritis.  Electronically Signed   By: Dahlia Bailiff MD   On: 10/28/2020 16:10 Note: Reviewed        Physical Exam  General appearance: Well nourished, well developed, and well hydrated. In no apparent acute distress Mental status: Alert, oriented x 3 (person, place, & time)       Respiratory: No evidence of acute respiratory distress Eyes: PERLA Vitals: BP (!) 144/84 (BP Location: Right Arm, Patient Position: Sitting, Cuff Size: Large)   Pulse 62   Temp (!) 97 F (36.1 C) (Temporal)   Resp 16   Ht _0  (1.702 m)   Wt 215 lb (97.5 kg)   SpO2 98%   BMI 33.67 kg/m  BMI: Estimated body mass index is 33.67 kg/m as calculated from the following:   Height as of this encounter: _1  (1.702 m).   Weight as of this encounter: 215 lb (97.5 kg). Ideal: Ideal body weight: 66.1 kg (145 lb 11.6 oz) Adjusted ideal body weight: 78.7 kg (173 lb 6.9 oz)  Assessment   Status Diagnosis  Controlled Controlled Controlled 1. Chronic pain syndrome   2. Chronic neck pain (1ry area of Pain) (Bilateral) (L>R)   3. 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 Pharmacologic therapy   5. Chronic use of opiate for therapeutic purpose   6. Encounter for medication management      Updated Problems: Problem  Pacemaker  Chronic Diastolic Heart Failure (Hcc)  Serum Lipase Elevation  Transaminasemia  Prediabetes  Left Wrist Pain (Resolved)  Left Shoulder Pain (Resolved)  Neuropathic Pain of Flank (Resolved)  Nephrolithiasis (Resolved)  Choledocholithiasis (Resolved)  Acute Gallstone Pancreatitis (Resolved)  Acute Cholecystitis (Resolved)  Hydrocele in Adult (Resolved)  Elevated Bp (Resolved)  Bp (High Blood Pressure) (Resolved)  Possible Exposure to Std (Resolved)    Plan of Care   Problem-specific:  No problem-specific Assessment & Plan notes found for this encounter.  Mr. OAK DOREY has a current medication list which includes the following long-term medication(s): cetirizine, dicyclomine, diphenhydramine, fluoxetine, fluoxetine, fluticasone, furosemide, omeprazole, [START ON 03/24/2021] oxycodone hcl, [START ON 04/23/2021] oxycodone hcl, [START ON 05/23/2021] oxycodone hcl, and ropinirole.  Pharmacotherapy (Medications Ordered): Meds ordered this encounter  Medications   Oxycodone HCl 10 MG TABS    Sig: Take 1 tablet (10 mg total) by mouth every 6 (six) hours as needed.    Dispense:  120 tablet    Refill:  0    Not a duplicate. Do NOT delete! Dispense 1 day early if closed on refill date. Avoid benzodiazepines within 8 hours of opioids. Do not send refill requests.   Oxycodone HCl 10 MG TABS    Sig: Take 1 tablet (10 mg  total) by mouth every 6 (six) hours as needed.    Dispense:  120 tablet    Refill:  0    Not a duplicate. Do NOT delete! Dispense 1 day early if closed on refill date. Avoid benzodiazepines within 8 hours of opioids. Do not send refill requests.   Oxycodone HCl 10 MG TABS    Sig: Take 1 tablet (10 mg total) by mouth every 6 (six) hours as needed.    Dispense:  120 tablet    Refill:  0    Not a duplicate. Do NOT delete! Dispense 1 day early if closed on refill date. Avoid benzodiazepines within 8 hours of opioids. Do not send refill requests.    Orders:  No orders of the defined types were placed in this encounter.  Follow-up plan:   Return in about 13 weeks (around 06/22/2021) for Eval-day(M,W), (F2F), (MM).     Considering:   Diagnostic right hand digit 2 trigger finger injection Therapeutic bilateral IA Hyalgan knee injections  Diagnostic bilateral cervical facet block  Possible bilateral cervical facet RFA  Diagnostic left sided CESI  Diagnostic bilateral IA knee injection  Diagnostic bilateral genicular NB  Possible bilateral  genicular nerve RFA  Diagnostic bilateral sacroiliac joint block  Possible bilateral sacroiliac joint RFA  Diagnostic bilateral IA shoulder joint injection  Diagnostic bilateral suprascapular NB  Possible bilateral suprascapular nerve RFA  Diagnostic bilateral lumbar facet block  Possible bilateral lumbar facet RFA  Diagnostic left L4-5 LESI    Palliative PRN treatment(s):   Bilateral intra-articular Hyalgan knee injection series (injection #4)  Diagnostic bilateral cervical facet block  Diagnostic left sided CESI  Diagnostic bilateral IA knee injection  Diagnostic bilateral genicular NB  Diagnostic bilateral sacroiliac joint block  Diagnostic bilateral IA shoulder joint injection  Diagnostic bilateral suprascapular NB  Diagnostic bilateral lumbar facet block   Diagnostic left L4-5 LESI     Recent Visits No visits were found meeting these conditions. Showing recent visits within past 90 days and meeting all other requirements Today's Visits Date Type Provider Dept  03/23/21 Office Visit Milinda Pointer, MD [facility]  Showing today's visits and meeting all other requirements Future Appointments No visits were found meeting these conditions. Showing future appointments within next 90 days and meeting all other requirements I discussed the assessment and treatment plan with the patient. The patient was provided an opportunity to ask questions and all were answered. The patient agreed with the plan and demonstrated an understanding of the instructions.  Patient advised to call back or seek an in-person evaluation if the symptoms or condition worsens.  Duration of encounter: 30 minutes.  Note by: Gaspar Cola, MD Date: 03/23/2021; Time: 9:04 AM

## 2021-03-23 ENCOUNTER — Ambulatory Visit: Payer: Medicare Other | Attending: Pain Medicine | Admitting: Pain Medicine

## 2021-03-23 ENCOUNTER — Other Ambulatory Visit: Payer: Self-pay

## 2021-03-23 VITALS — BP 144/84 | HR 62 | Temp 97.0°F | Resp 16 | Ht 67.0 in | Wt 215.0 lb

## 2021-03-23 DIAGNOSIS — G894 Chronic pain syndrome: Secondary | ICD-10-CM | POA: Diagnosis not present

## 2021-03-23 DIAGNOSIS — Z79899 Other long term (current) drug therapy: Secondary | ICD-10-CM | POA: Insufficient documentation

## 2021-03-23 DIAGNOSIS — M542 Cervicalgia: Secondary | ICD-10-CM | POA: Insufficient documentation

## 2021-03-23 DIAGNOSIS — G8929 Other chronic pain: Secondary | ICD-10-CM | POA: Diagnosis not present

## 2021-03-23 DIAGNOSIS — M961 Postlaminectomy syndrome, not elsewhere classified: Secondary | ICD-10-CM | POA: Diagnosis not present

## 2021-03-23 DIAGNOSIS — Z79891 Long term (current) use of opiate analgesic: Secondary | ICD-10-CM | POA: Diagnosis not present

## 2021-03-23 MED ORDER — OXYCODONE HCL 10 MG PO TABS: 10.0000 mg | ORAL_TABLET | Freq: Four times a day (QID) | ORAL | 0 refills | Status: DC | PRN

## 2021-03-23 NOTE — Patient Instructions (Signed)
____________________________________________________________________________________________  Medication Rules  Purpose: To inform patients, and their family members, of our rules and regulations.  Applies to: All patients receiving prescriptions (written or electronic).  Pharmacy of record: Pharmacy where electronic prescriptions will be sent. If written prescriptions are taken to a different pharmacy, please inform the nursing staff. The pharmacy listed in the electronic medical record should be the one where you would like electronic prescriptions to be sent.  Electronic prescriptions: In compliance with the [place] Strengthen Opioid Misuse Prevention (STOP) Act of 2017 (Session Law 2017-74/H243), effective August 07, 2018, all controlled substances must be electronically prescribed. Calling prescriptions to the pharmacy will cease to exist.  Prescription refills: Only during scheduled appointments. Applies to all prescriptions.  NOTE: The following applies primarily to controlled substances (Opioid* Pain Medications).   Type of encounter (visit): For patients receiving controlled substances, face-to-face visits are required. (Not an option or up to the patient.)  Patient's responsibilities: Pain Pills: Bring all pain pills to every appointment (except for procedure appointments). Pill Bottles: Bring pills in original pharmacy bottle. Always bring the newest bottle. Bring bottle, even if empty. Medication refills: You are responsible for knowing and keeping track of what medications you take and those you need refilled. The day before your appointment: write a list of all prescriptions that need to be refilled. The day of the appointment: give the list to the admitting nurse. Prescriptions will be written only during appointments. No prescriptions will be written on procedure days. If you forget a medication: it will not be "Called in", "Faxed", or "electronically sent". You will  need to get another appointment to get these prescribed. No early refills. Do not call asking to have your prescription filled early. Prescription Accuracy: You are responsible for carefully inspecting your prescriptions before leaving our office. Have the discharge nurse carefully go over each prescription with you, before taking them home. Make sure that your name is accurately spelled, that your address is correct. Check the name and dose of your medication to make sure it is accurate. Check the number of pills, and the written instructions to make sure they are clear and accurate. Make sure that you are given enough medication to last until your next medication refill appointment. Taking Medication: Take medication as prescribed. When it comes to controlled substances, taking less pills or less frequently than prescribed is permitted and encouraged. Never take more pills than instructed. Never take medication more frequently than prescribed.  Inform other Doctors: Always inform, all of your healthcare providers, of all the medications you take. Pain Medication from other Providers: You are not allowed to accept any additional pain medication from any other Doctor or Healthcare provider. There are two exceptions to this rule. (see below) In the event that you require additional pain medication, you are responsible for notifying us, as stated below. Cough Medicine: Often these contain an opioid, such as codeine or hydrocodone. Never accept or take cough medicine containing these opioids if you are already taking an opioid* medication. The combination may cause respiratory failure and death. Medication Agreement: You are responsible for carefully reading and following our Medication Agreement. This must be signed before receiving any prescriptions from our practice. Safely store a copy of your signed Agreement. Violations to the Agreement will result in no further prescriptions. (Additional copies of our  Medication Agreement are available upon request.) Laws, Rules, & Regulations: All patients are expected to follow all Federal and State Laws, Statutes, Rules, & Regulations. Ignorance of   the Laws does not constitute a valid excuse.  Illegal drugs and Controlled Substances: The use of illegal substances (including, but not limited to marijuana and its derivatives) and/or the illegal use of any controlled substances is strictly prohibited. Violation of this rule may result in the immediate and permanent discontinuation of any and all prescriptions being written by our practice. The use of any illegal substances is prohibited. Adopted CDC guidelines & recommendations: Target dosing levels will be at or below 60 MME/day. Use of benzodiazepines** is not recommended.  Exceptions: There are only two exceptions to the rule of not receiving pain medications from other Healthcare Providers. Exception #1 (Emergencies): In the event of an emergency (i.e.: accident requiring emergency care), you are allowed to receive additional pain medication. However, you are responsible for: As soon as you are able, call our office [phone], at any time of the day or night, and leave a message stating your name, the date and nature of the emergency, and the name and dose of the medication prescribed. In the event that your call is answered by a member of our staff, make sure to document and save the date, time, and the name of the person that took your information.  Exception #2 (Planned Surgery): In the event that you are scheduled by another doctor or dentist to have any type of surgery or procedure, you are allowed (for a period no longer than 30 days), to receive additional pain medication, for the acute post-op pain. However, in this case, you are responsible for picking up a copy of our "Post-op Pain Management for Surgeons" handout, and giving it to your surgeon or dentist. This document is available at our office, and  does not require an appointment to obtain it. Simply go to our office during business hours (Monday-Thursday from 8:00 AM to 4:00 PM) (Friday 8:00 AM to 12:00 Noon) or if you have a scheduled appointment with us, prior to your surgery, and ask for it by name. In addition, you are responsible for: calling our office [phone], at any time of the day or night, and leaving a message stating your name, name of your surgeon, type of surgery, and date of procedure or surgery. Failure to comply with your responsibilities may result in termination of therapy involving the controlled substances.  *Opioid medications include: morphine, codeine, oxycodone, oxymorphone, hydrocodone, hydromorphone, meperidine, tramadol, tapentadol, buprenorphine, fentanyl, methadone. **Benzodiazepine medications include: diazepam (Valium), alprazolam (Xanax), clonazepam (Klonopine), lorazepam (Ativan), clorazepate (Tranxene), chlordiazepoxide (Librium), estazolam (Prosom), oxazepam (Serax), temazepam (Restoril), triazolam (Halcion) (Last updated: 07/05/2020) ____________________________________________________________________________________________  ____________________________________________________________________________________________  Medication Recommendations and Reminders  Applies to: All patients receiving prescriptions (written and/or electronic).  Medication Rules & Regulations: These rules and regulations exist for your safety and that of others. They are not flexible and neither are we. Dismissing or ignoring them will be considered "non-compliance" with medication therapy, resulting in complete and irreversible termination of such therapy. (See document titled "Medication Rules" for more details.) In all conscience, because of safety reasons, we cannot continue providing a therapy where the patient does not follow instructions.  Pharmacy of record:  Definition: This is the pharmacy where your electronic  prescriptions will be sent.  We do not endorse any particular pharmacy, however, we have experienced problems with [facility] not securing enough medication supply for the community. We do not restrict you in your choice of pharmacy. However, once we write for your prescriptions, we will NOT be re-sending more prescriptions to fix restricted supply problems   created by your pharmacy, or your insurance.  The pharmacy listed in the electronic medical record should be the one where you want electronic prescriptions to be sent. If you choose to change pharmacy, simply notify our nursing staff.  Recommendations: Keep all of your pain medications in a safe place, under lock and key, even if you live alone. We will NOT replace lost, stolen, or damaged medication. After you fill your prescription, take 1 week's worth of pills and put them away in a safe place. You should keep a separate, properly labeled bottle for this purpose. The remainder should be kept in the original bottle. Use this as your primary supply, until it runs out. Once it's gone, then you know that you have 1 week's worth of medicine, and it is time to come in for a prescription refill. If you do this correctly, it is unlikely that you will ever run out of medicine. To make sure that the above recommendation works, it is very important that you make sure your medication refill appointments are scheduled at least 1 week before you run out of medicine. To do this in an effective manner, make sure that you do not leave the office without scheduling your next medication management appointment. Always ask the nursing staff to show you in your prescription , when your medication will be running out. Then arrange for the receptionist to get you a return appointment, at least 7 days before you run out of medicine. Do not wait until you have 1 or 2 pills left, to come in. This is very poor planning and does not take into consideration that we may need to  cancel appointments due to bad weather, sickness, or emergencies affecting our staff. DO NOT ACCEPT A "Partial Fill": If for any reason your pharmacy does not have enough pills/tablets to completely fill or refill your prescription, do not allow for a "partial fill". The law allows the pharmacy to complete that prescription within 72 hours, without requiring a new prescription. If they do not fill the rest of your prescription within those 72 hours, you will need a separate prescription to fill the remaining amount, which we will NOT provide. If the reason for the partial fill is your insurance, you will need to talk to the pharmacist about payment alternatives for the remaining tablets, but again, DO NOT ACCEPT A PARTIAL FILL, unless you can trust your pharmacist to obtain the remainder of the pills within 72 hours.  Prescription refills and/or changes in medication(s):  Prescription refills, and/or changes in dose or medication, will be conducted only during scheduled medication management appointments. (Applies to both, written and electronic prescriptions.) No refills on procedure days. No medication will be changed or started on procedure days. No changes, adjustments, and/or refills will be conducted on a procedure day. Doing so will interfere with the diagnostic portion of the procedure. No phone refills. No medications will be "called into the pharmacy". No Fax refills. No weekend refills. No Holliday refills. No after hours refills.  Remember:  Business hours are:  Monday to Thursday 8:00 AM to 4:00 PM Provider's Schedule: Azarie Coriz, MD - Appointments are:  Medication management: Monday and Wednesday 8:00 AM to 4:00 PM Procedure day: Tuesday and Thursday 7:30 AM to 4:00 PM Bilal Lateef, MD - Appointments are:  Medication management: Tuesday and Thursday 8:00 AM to 4:00 PM Procedure day: Monday and Wednesday 7:30 AM to 4:00 PM (Last update:  02/25/2020) ____________________________________________________________________________________________  ____________________________________________________________________________________________  CBD (cannabidiol) WARNING    Applicable to: All individuals currently taking or considering taking CBD (cannabidiol) and, more important, all patients taking opioid analgesic controlled substances (pain medication). (Example: oxycodone; oxymorphone; hydrocodone; hydromorphone; morphine; methadone; tramadol; tapentadol; fentanyl; buprenorphine; butorphanol; dextromethorphan; meperidine; codeine; etc.)  Legal status: CBD remains a Schedule I drug prohibited for any use. CBD is illegal with one exception. In the [place], CBD has a limited Food and Drug Administration (FDA) approval for the treatment of two specific types of epilepsy disorders. Only one CBD product has been approved by the FDA for this purpose: "Epidiolex". FDA is aware that some companies are marketing products containing cannabis and cannabis-derived compounds in ways that violate the Federal Food, Drug and Cosmetic Act (FD&C Act) and that may put the health and safety of consumers at risk. The FDA, a Federal agency, has not enforced the CBD status since 2018.   Legality: Some manufacturers ship CBD products nationally, which is illegal. Often such products are sold online and are therefore available throughout the country. CBD is openly sold in head shops and health food stores in some states where such sales have not been explicitly legalized. Selling unapproved products with unsubstantiated therapeutic claims is not only a violation of the law, but also can put patients at risk, as these products have not been proven to be safe or effective. Federal illegality makes it difficult to conduct research on CBD.  Reference: "FDA Regulation of Cannabis and Cannabis-Derived Products, Including Cannabidiol (CBD)" -  https://www.fda.gov/news-events/public-health-focus/fda-regulation-cannabis-and-cannabis-derived-products-including-cannabidiol-cbd  Warning: CBD is not FDA approved and has not undergo the same manufacturing controls as prescription drugs.  This means that the purity and safety of available CBD may be questionable. Most of the time, despite manufacturer's claims, it is contaminated with THC (delta-9-tetrahydrocannabinol - the chemical in marijuana responsible for the "HIGH").  When this is the case, the THC contaminant will trigger a positive urine drug screen (UDS) test for Marijuana (carboxy-THC). Because a positive UDS for any illicit substance is a violation of our medication agreement, your opioid analgesics (pain medicine) may be permanently discontinued.  MORE ABOUT CBD  General Information: CBD  is a derivative of the Marijuana (cannabis sativa) plant discovered in 1940. It is one of the 113 identified substances found in Marijuana. It accounts for up to 40% of the plant's extract. As of 2018, preliminary clinical studies on CBD included research for the treatment of anxiety, movement disorders, and pain. CBD is available and consumed in multiple forms, including inhalation of smoke or vapor, as an aerosol spray, and by mouth. It may be supplied as an oil containing CBD, capsules, dried cannabis, or as a liquid solution. CBD is thought not to be as psychoactive as THC (delta-9-tetrahydrocannabinol - the chemical in marijuana responsible for the "HIGH"). Studies suggest that CBD may interact with different biological target receptors in the body, including cannabinoid and other neurotransmitter receptors. As of 2018 the mechanism of action for its biological effects has not been determined.  Side-effects  Adverse reactions: Dry mouth, diarrhea, decreased appetite, fatigue, drowsiness, malaise, weakness, sleep disturbances, and others.  Drug interactions: CBC may interact with other medications  such as blood-thinners. (Last update: 03/13/2020) ____________________________________________________________________________________________  ____________________________________________________________________________________________  Drug Holidays (Slow)  What is a "Drug Holiday"? Drug Holiday: is the name given to the period of time during which a patient stops taking a medication(s) for the purpose of eliminating tolerance to the drug.  Benefits Improved effectiveness of opioids. Decreased opioid dose needed to achieve benefits. Improved pain with lesser dose.    What is tolerance? Tolerance: is the progressive decreased in effectiveness of a drug due to its repetitive use. With repetitive use, the body gets use to the medication and as a consequence, it loses its effectiveness. This is a common problem seen with opioid pain medications. As a result, a larger dose of the drug is needed to achieve the same effect that used to be obtained with a smaller dose.  How long should a "Drug Holiday" last? You should stay off of the pain medicine for at least 14 consecutive days. (2 weeks)  Should I stop the medicine "cold turkey"? No. You should always coordinate with your Pain Specialist so that he/she can provide you with the correct medication dose to make the transition as smoothly as possible.  How do I stop the medicine? Slowly. You will be instructed to decrease the daily amount of pills that you take by one (1) pill every seven (7) days. This is called a "slow downward taper" of your dose. For example: if you normally take four (4) pills per day, you will be asked to drop this dose to three (3) pills per day for seven (7) days, then to two (2) pills per day for seven (7) days, then to one (1) per day for seven (7) days, and at the end of those last seven (7) days, this is when the "Drug Holiday" would start.   Will I have withdrawals? By doing a "slow downward taper" like this one, it  is unlikely that you will experience any significant withdrawal symptoms. Typically, what triggers withdrawals is the sudden stop of a high dose opioid therapy. Withdrawals can usually be avoided by slowly decreasing the dose over a prolonged period of time. If you do not follow these instructions and decide to stop your medication abruptly, withdrawals may be possible.  What are withdrawals? Withdrawals: refers to the wide range of symptoms that occur after stopping or dramatically reducing opiate drugs after heavy and prolonged use. Withdrawal symptoms do not occur to patients that use low dose opioids, or those who take the medication sporadically. Contrary to benzodiazepine (example: Valium, Xanax, etc.) or alcohol withdrawals ("Delirium Tremens"), opioid withdrawals are not lethal. Withdrawals are the physical manifestation of the body getting rid of the excess receptors.  Expected Symptoms Early symptoms of withdrawal may include: Agitation Anxiety Muscle aches Increased tearing Insomnia Runny nose Sweating Yawning  Late symptoms of withdrawal may include: Abdominal cramping Diarrhea Dilated pupils Goose bumps Nausea Vomiting  Will I experience withdrawals? Due to the slow nature of the taper, it is very unlikely that you will experience any.  What is a slow taper? Taper: refers to the gradual decrease in dose.  (Last update: 02/25/2020) ____________________________________________________________________________________________    

## 2021-03-23 NOTE — Progress Notes (Signed)
Nursing Pain Medication Assessment:  Safety precautions to be maintained throughout the outpatient stay will include: orient to surroundings, keep bed in low position, maintain call bell within reach at all times, provide assistance with transfer out of bed and ambulation.  Medication Inspection Compliance: Pill count conducted under aseptic conditions, in front of the patient. Neither the pills nor the bottle was removed from the patient's sight at any time. Once count was completed pills were immediately returned to the patient in their original bottle.  Medication:  Oxycodone 10 mg Pill/Patch Count:  07 of 120 pills remain Pill/Patch Appearance: Markings consistent with prescribed medication Bottle Appearance: Standard pharmacy container. Clearly labeled. Filled Date: 07 / 12 / 2022 Last Medication intake:  Today

## 2021-05-16 ENCOUNTER — Other Ambulatory Visit: Payer: Self-pay | Admitting: Family Medicine

## 2021-05-16 DIAGNOSIS — G894 Chronic pain syndrome: Secondary | ICD-10-CM

## 2021-05-25 DIAGNOSIS — R001 Bradycardia, unspecified: Secondary | ICD-10-CM | POA: Diagnosis not present

## 2021-06-17 ENCOUNTER — Telehealth: Payer: Self-pay | Admitting: Family Medicine

## 2021-06-17 NOTE — Chronic Care Management (AMB) (Signed)
  Chronic Care Management   Note  06/17/2021 Name: COHL BEHRENS MRN: [id number] DOB: [birth year]  DUAN SCHARNHORST is a 65 y.o. year old male who is a primary care patient of [name], MD. I reached out to [name] by phone today in response to a referral sent by Mr. Montey Ebel Reeser's PCP, [name], MD.   Mr. Dietze was given information about Chronic Care Management services today including:  CCM service includes personalized support from designated clinical staff supervised by his physician, including individualized plan of care and coordination with other care providers 24/7 contact phone numbers for assistance for urgent and routine care needs. Service will only be billed when office clinical staff spend 20 minutes or more in a month to coordinate care. Only one practitioner may furnish and bill the service in a calendar month. The patient may stop CCM services at any time (effective at the end of the month) by phone call to the office staff.   Patient agreed to services and verbal consent obtained.   Follow up plan:   [employer], [employer]

## 2021-06-21 NOTE — Progress Notes (Signed)
PROVIDER NOTE: Information contained herein reflects review and annotations entered in association with encounter. Interpretation of such information and data should be left to medically-trained personnel. Information provided to patient can be located elsewhere in the medical record under "Patient Instructions". Document created using STT-dictation technology, any transcriptional errors that may result from process are unintentional.    Patient: Mark [name]  Service Category: E/M  Provider: Gaspar Cola, MD  DOB: 1955/11/29  DOS: 06/22/2021  Specialty: Interventional Pain Management  MRN: [id number]  Setting: Ambulatory outpatient  PCP: Ria Bush, MD  Type: Established Patient    Referring Provider: Ria Bush, MD  Location: Office  Delivery: Face-to-face     HPI  Mr. ENIS RIECKE, a 65 y.o. year old male, is here today because of his No primary diagnosis found.. Mr. Duarte primary complain today is Shoulder Pain (Left ) Last encounter: My last encounter with him was on 03/23/2021. Pertinent problems: Mr. Canning has Chronic pain syndrome; Nephrolithiasis; Chronic neck pain (1ry area of Pain) (Bilateral) (L>R); Chronic knee pain (Bilateral) (R>L); Radicular pain of shoulder (Bilateral) (L>R); 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); Cervicogenic headache; Cervical spondylosis with radiculopathy (Bilateral) (L>R); Cervical facet syndrome (Bilateral) (L>R); Cervical foraminal stenosis (Severe C6-7) (Left); Chronic sacroiliac joint pain (Bilateral); Osteoarthritis of sacroiliac joint (Bilateral); Lumbar facet hypertrophy (L1-2, L2-3, and L4-5) (Bilateral); Lumbar IVDD (intervertebral disc displacement); 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); Lumbar facet syndrome (Bilateral); Osteoarthritis of shoulder (Left); Osteoarthritis of knee (Bilateral) (R>L); Chronic cervical radicular pain; Chronic fatigue; RLS (restless legs syndrome); Trigger finger of index finger  (Right); Cervicalgia; Trigger finger of thumb (Right); Chronic thumb pain (Left); Trigger middle finger of right hand; Chronic hand pain (Left); Osteoarthritis of first carpometacarpal joint (thumb) of hand (Left); Trigger point with back pain (Left); Trigger point of shoulder region (scapula) (Left); History of laparoscopic cholecystectomy (10/27/2020); DDD (degenerative disc disease), cervical; Chronic low back pain (Bilateral) w/o sciatica; and Chronic shoulder pain (Bilateral) (L>R) on their pertinent problem list. Pain Assessment: Severity of Chronic pain is reported as a 4 /10. Location: Shoulder Left/up into the neck. Onset: More than a month ago. Quality: Discomfort, Constant, Aching, Burning, Sharp, Dull. Timing: Constant. Modifying factor(s): rest and staying off feet.  medications.  topical, biofreeze. Vitals:  height is $RemoveB'5\' 7"'EKYkMDUs$  (1.702 m) and weight is 225 lb (102.1 kg). His temporal temperature is 96.4 F (35.8 C) (abnormal). His blood pressure is 146/81 (abnormal) and his pulse is 84. His respiration is 16 and oxygen saturation is 100%.   Reason for encounter: medication management.   The patient indicates doing well with the current medication regimen. No adverse reactions or side effects reported to the medications.   RTCB: 09/25/2021  Pharmacotherapy Assessment  Analgesic: Oxycodone IR 10 mg, 1 tab PO q 6 hrs (40 mg/day total of oxycodone) MME/day: 60 mg/day.   Monitoring: [place] PMP: PDMP reviewed during this encounter.       Pharmacotherapy: No side-effects or adverse reactions reported. Compliance: No problems identified. Effectiveness: Clinically acceptable.  Janett Billow, RN  06/22/2021  9:17 AM  Sign when Signing Visit Covid 19  4 th booster 05/05/21  Flu vaccine 05/04/21  Janett Billow, RN  06/22/2021  9:06 AM  Sign when Signing Visit Nursing Pain Medication Assessment:  Safety precautions to be maintained throughout the outpatient stay will include: orient to  surroundings, keep bed in low position, maintain call bell within reach at all times, provide assistance with transfer out of  bed and ambulation.  Medication Inspection Compliance: Pill count conducted under aseptic conditions, in front of the patient. Neither the pills nor the bottle was removed from the patient's sight at any time. Once count was completed pills were immediately returned to the patient in their original bottle.  Medication: Oxycodone IR Pill/Patch Count:  45 of 120 pills remain Pill/Patch Appearance: Markings consistent with prescribed medication Bottle Appearance: Standard pharmacy container. Clearly labeled. Filled Date: 38 / 22 / 2022 Last Medication intake:  Today    UDS:  Summary  Date Value Ref Range Status  12/08/2020 Note  Final    Comment:    ==================================================================== [employer] [facility] 13 (MW) ==================================================================== Test                             Result       Flag       Units  Drug Present and Declared for Prescription Verification   Oxycodone                      1357         EXPECTED   ng/mg creat   Oxymorphone                    128          EXPECTED   ng/mg creat   Noroxycodone                   3434         EXPECTED   ng/mg creat    Sources of oxycodone include scheduled prescription medications.    Oxymorphone and noroxycodone are expected metabolites of oxycodone.    Oxymorphone is also available as a scheduled prescription medication.  ==================================================================== Test                      Result    Flag   Units      Ref Range   Creatinine              68               mg/dL      >=20 ==================================================================== Declared Medications:  The flagging and interpretation on this report are based on the  following declared medications.  Unexpected results may arise from  inaccuracies  in the declared medications.   **Note: The testing scope of this panel includes these medications:   Oxycodone   **Note: The testing scope of this panel does not include the  following reported medications:   Aspirin  Atorvastatin  Cetirizine (Zyrtec)  Cranberry  Cyclobenzaprine (Flexeril)  Dicyclomine (Bentyl)  Diphenhydramine (Benadryl)  Fluoxetine (Prozac)  Fluticasone (Flonase)  Iron  Melatonin  Multivitamin  Naloxone (Narcan)  Naproxen  Omega-3 Fatty Acids  Omeprazole  Potassium  Ropinirole  Supplement  Vitamin B  Vitamin C  Vitamin E ==================================================================== For clinical consultation, please call [phone]. ====================================================================      ROS  Constitutional: Denies any fever or chills Gastrointestinal: No reported hemesis, hematochezia, vomiting, or acute GI distress Musculoskeletal: Denies any acute onset joint swelling, redness, loss of ROM, or weakness Neurological: No reported episodes of acute onset apraxia, aphasia, dysarthria, agnosia, amnesia, paralysis, loss of coordination, or loss of consciousness  Medication Review  Cert[employer]ite Senior/Antioxidant, Cranberry, FLUoxetine, Garlic, Omega-3, Oxycodone HCl, Saw Palmetto (Serenoa repens), Vitamin B Complex-C, Vitamin D, aspirin, atorvastatin, cetirizine, clindamycin, cyclobenzaprine,  dicyclomine, diphenhydrAMINE, ferrous sulfate, fluticasone, furosemide, naloxone, naproxen, omeprazole, potassium citrate, rOPINIRole, vitamin C, and vitamin E  History Review  Allergy: Mr. Torrance is allergic to ciprofloxacin, metformin, buprenorphine hcl, morphine and related, and penicillins. Drug: Mr. Massmann  reports no history of drug use. Alcohol:  reports no history of alcohol use. Tobacco:  reports that he has never smoked. He has never used smokeless tobacco. Social: Mr. Dority  reports that he has never smoked. He has never used  smokeless tobacco. He reports that he does not drink alcohol and does not use drugs. Medical:  has a past medical history of Anxiety, Arthritis, Chronic pain, Depression, Heart murmur, History of kidney stones (April 12,2017), History of permanent cardiac pacemaker placement (05/31/2015), Hydrocele in adult (05/07/2015), Kidney stones, Migraine, MRSA carrier (2019), Presence of permanent cardiac pacemaker, Prostatitis, and Sleep apnea. Surgical: Mr. Frett  has a past surgical history that includes Tonsillectomy (1964); Carpal tunnel release (Bilateral, 2006); Ulnar nerve repair (2008); Cervical spine surgery (2008); Rotator cuff repair (Left, 2009); pacemaker placement (2009); Lithotripsy (Bilateral, 2012); Lithotripsy (Left, 11/2015); Cardiovascular stress test (2014); Colonoscopy (07/2007); implantable cardioverter defibrillator (icd) generator change (Left, 12/13/2017); pace maker revision (12/13/2017); Laparoscopic cholecystectomy (10/2020); Cholecystectomy (N/A, 10/27/2020); ERCP (N/A, 10/26/2020); Esophagogastroduodenoscopy (egd) with propofol (N/A, 10/26/2020); EUS (10/26/2020); Sphincterotomy (10/26/2020); and removal of stones (10/26/2020). Family: family history includes Arthritis in his father and mother; Cancer in his father and mother; Diabetes in his mother; Heart block in his father; Heart disease in his father and mother.  Laboratory Chemistry Profile   Renal Lab Results  Component Value Date   BUN 13 10/29/2020   CREATININE 0.89 10/29/2020   GFR 91.41 09/07/2020   GFRAA >60 12/06/2017   GFRNONAA >60 10/29/2020    Hepatic Lab Results  Component Value Date   AST 137 (H) 10/29/2020   ALT 163 (H) 10/29/2020   ALBUMIN 2.6 (L) 10/29/2020   ALKPHOS 170 (H) 10/29/2020   HCVAB NEGATIVE 07/22/2013   LIPASE 39 10/28/2020    Electrolytes Lab Results  Component Value Date   NA 137 10/29/2020   K 3.7 10/29/2020   CL 105 10/29/2020   CALCIUM 8.6 (L) 10/29/2020   MG 2.1 09/01/2015     Bone Lab Results  Component Value Date   25OHVITD1 63 02/02/2016   25OHVITD2 <1.0 02/02/2016   25OHVITD3 63 02/02/2016   TESTOSTERONE 216.29 (L) 02/13/2017    Inflammation (CRP: Acute Phase) (ESR: Chronic Phase) Lab Results  Component Value Date   CRP 1.7 (H) 09/01/2015   ESRSEDRATE 6 09/01/2015         Note: Above Lab results reviewed.  Recent Imaging Review  NM HEPATOBILIARY LEAK (POST-SURGICAL) CLINICAL DATA:  Recurrent abdominal pain post cholecystectomy.  EXAM: NUCLEAR MEDICINE HEPATOBILIARY IMAGING  TECHNIQUE: Sequential images of the abdomen were obtained out to 60 minutes following intravenous administration of radiopharmaceutical.  RADIOPHARMACEUTICALS:  7.6 mCi Tc-21m Choletec IV  COMPARISON:  ERCP October 26, 2020.  FINDINGS: Minimally prolonged blood pool activity with mildly delayed biliary excretion of activity by the liver. No scintigraphic findings to suggest biliary leak. Biliary activity passes into small bowel, consistent with patent common bile duct. There is no evidence of delayed biliary to bowel transit, in there is persistent decrease in common duct activity during delayed imaging the after its peak at 50 minutes. Small amount of enterogastric biliary reflux visualized at 110-120 minutes on delayed imaging.  IMPRESSION: No scintigraphic evidence of biliary leak or sphincter of Oddi dysfunction.  Minimally prolonged  blood pool activity and biliary clearance of activity by the liver suggestive of hepatocellular dysfunction.  Small amount of enterogastric biliary reflux seen on delayed imaging which is nonspecific and can be a normal finding but also can be indicative of alkaline gastritis.  Electronically Signed   By: Dahlia Bailiff MD   On: 10/28/2020 16:10 Note: Reviewed        Physical Exam  General appearance: Well nourished, well developed, and well hydrated. In no apparent acute distress Mental status: Alert, oriented x 3  (person, place, & time)       Respiratory: No evidence of acute respiratory distress Eyes: PERLA Vitals: BP (!) 146/81 (BP Location: Right Arm, Patient Position: Sitting, Cuff Size: Large)   Pulse 84   Temp (!) 96.4 F (35.8 C) (Temporal)   Resp 16   Ht 5' 7"  (1.702 m)   Wt 225 lb (102.1 kg)   SpO2 100%   BMI 35.24 kg/m  BMI: Estimated body mass index is 35.24 kg/m as calculated from the following:   Height as of this encounter: 5' 7"  (1.702 m).   Weight as of this encounter: 225 lb (102.1 kg). Ideal: Ideal body weight: 66.1 kg (145 lb 11.6 oz) Adjusted ideal body weight: 80.5 kg (177 lb 6.9 oz)  Assessment   Status Diagnosis  Controlled Controlled Controlled 1. Chronic pain syndrome   2. Pharmacologic therapy   3. Chronic use of opiate for therapeutic purpose   4. Encounter for medication management   5. Chronic neck pain (1ry area of Pain) (Bilateral) (L>R)   6. 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)      Updated Problems: No problems updated.  Plan of Care  Problem-specific:  No problem-specific Assessment & Plan notes found for this encounter.  Mr. ALDIN DREES has a current medication list which includes the following long-term medication(s): cetirizine, dicyclomine, diphenhydramine, fluoxetine, fluoxetine, fluticasone, furosemide, omeprazole, [START ON 06/27/2021] oxycodone hcl, [START ON 07/27/2021] oxycodone hcl, [START ON 08/26/2021] oxycodone hcl, and ropinirole.  Pharmacotherapy (Medications Ordered): Meds ordered this encounter  Medications   Oxycodone HCl 10 MG TABS    Sig: Take 1 tablet (10 mg total) by mouth every 6 (six) hours as needed.    Dispense:  120 tablet    Refill:  0    DO NOT: delete (not duplicate); no partial-fill (will deny script to complete), no refill request (F/U required). DISPENSE: 1 day early if closed on fill date. WARN: No CNS-depressants within 8 hrs of med.   Oxycodone HCl 10 MG TABS    Sig: Take 1 tablet  (10 mg total) by mouth every 6 (six) hours as needed.    Dispense:  120 tablet    Refill:  0    DO NOT: delete (not duplicate); no partial-fill (will deny script to complete), no refill request (F/U required). DISPENSE: 1 day early if closed on fill date. WARN: No CNS-depressants within 8 hrs of med.   Oxycodone HCl 10 MG TABS    Sig: Take 1 tablet (10 mg total) by mouth every 6 (six) hours as needed.    Dispense:  120 tablet    Refill:  0    DO NOT: delete (not duplicate); no partial-fill (will deny script to complete), no refill request (F/U required). DISPENSE: 1 day early if closed on fill date. WARN: No CNS-depressants within 8 hrs of med.    Orders:  No orders of the defined types were placed in this encounter.  Follow-up plan:   Return in about 3 months (around 09/25/2021) for Eval-day (M,W), (F2F), (MM).     Considering:   Diagnostic right hand digit 2 trigger finger injection Therapeutic bilateral IA Hyalgan knee injections  Diagnostic bilateral cervical facet block  Possible bilateral cervical facet RFA  Diagnostic left sided CESI  Diagnostic bilateral IA knee injection  Diagnostic bilateral genicular NB  Possible bilateral genicular nerve RFA  Diagnostic bilateral sacroiliac joint block  Possible bilateral sacroiliac joint RFA  Diagnostic bilateral IA shoulder joint injection  Diagnostic bilateral suprascapular NB  Possible bilateral suprascapular nerve RFA  Diagnostic bilateral lumbar facet block  Possible bilateral lumbar facet RFA  Diagnostic left L4-5 LESI    Palliative PRN treatment(s):   Bilateral intra-articular Hyalgan knee injection series (injection #4)  Diagnostic bilateral cervical facet block  Diagnostic left sided CESI  Diagnostic bilateral IA knee injection  Diagnostic bilateral genicular NB  Diagnostic bilateral sacroiliac joint block  Diagnostic bilateral IA shoulder joint injection  Diagnostic bilateral suprascapular NB  Diagnostic bilateral  lumbar facet block   Diagnostic left L4-5 LESI     Recent Visits No visits were found meeting these conditions. Showing recent visits within past 90 days and meeting all other requirements Today's Visits Date Type Provider Dept  06/22/21 Office Visit Milinda Pointer, MD [facility]  Showing today's visits and meeting all other requirements Future Appointments No visits were found meeting these conditions. Showing future appointments within next 90 days and meeting all other requirements I discussed the assessment and treatment plan with the patient. The patient was provided an opportunity to ask questions and all were answered. The patient agreed with the plan and demonstrated an understanding of the instructions.  Patient advised to call back or seek an in-person evaluation if the symptoms or condition worsens.  Duration of encounter: 30 minutes.  Note by: Gaspar Cola, MD Date: 06/22/2021; Time: 9:26 AM

## 2021-06-22 ENCOUNTER — Other Ambulatory Visit: Payer: Self-pay

## 2021-06-22 ENCOUNTER — Encounter: Payer: Self-pay | Admitting: Pain Medicine

## 2021-06-22 ENCOUNTER — Ambulatory Visit: Payer: Medicare Other | Attending: Pain Medicine | Admitting: Pain Medicine

## 2021-06-22 DIAGNOSIS — G8929 Other chronic pain: Secondary | ICD-10-CM | POA: Diagnosis not present

## 2021-06-22 DIAGNOSIS — Z79891 Long term (current) use of opiate analgesic: Secondary | ICD-10-CM | POA: Insufficient documentation

## 2021-06-22 DIAGNOSIS — Z79899 Other long term (current) drug therapy: Secondary | ICD-10-CM | POA: Insufficient documentation

## 2021-06-22 DIAGNOSIS — M542 Cervicalgia: Secondary | ICD-10-CM | POA: Diagnosis not present

## 2021-06-22 DIAGNOSIS — G894 Chronic pain syndrome: Secondary | ICD-10-CM | POA: Insufficient documentation

## 2021-06-22 DIAGNOSIS — M961 Postlaminectomy syndrome, not elsewhere classified: Secondary | ICD-10-CM | POA: Diagnosis not present

## 2021-06-22 MED ORDER — OXYCODONE HCL 10 MG PO TABS: 10.0000 mg | ORAL_TABLET | Freq: Four times a day (QID) | ORAL | 0 refills | Status: DC | PRN

## 2021-06-22 NOTE — Progress Notes (Signed)
Covid 19  4 th booster 05/05/21  Flu vaccine 05/04/21

## 2021-06-22 NOTE — Progress Notes (Signed)
Nursing Pain Medication Assessment:  Safety precautions to be maintained throughout the outpatient stay will include: orient to surroundings, keep bed in low position, maintain call bell within reach at all times, provide assistance with transfer out of bed and ambulation.  Medication Inspection Compliance: Pill count conducted under aseptic conditions, in front of the patient. Neither the pills nor the bottle was removed from the patient's sight at any time. Once count was completed pills were immediately returned to the patient in their original bottle.  Medication: Oxycodone IR Pill/Patch Count:  45 of 120 pills remain Pill/Patch Appearance: Markings consistent with prescribed medication Bottle Appearance: Standard pharmacy container. Clearly labeled. Filled Date: 58 / 22 / 2022 Last Medication intake:  Today

## 2021-07-07 NOTE — Progress Notes (Signed)
Chronic Care Management Pharmacy Note  07/15/2021 Name:  Mark [name] MRN:  [id number] DOB:  09/24/1955  Summary: -Pt checks BP when he feels "off" or dizzy; it has been elevated when he does this (144/80 - 172/88); he does not take BP medication other than furosemide -Pt self-increased ropinirole from 1 to 2 mg at bedtime due to worsening RLS and symptoms improved; he requests new Rx for 2 mg dose -Pt is taking several OTC supplements and wants to know if he should stop any  Recommendations/Changes made from today's visit: -Advised pt to monitor BP daily to establish baseline; f/u 1 month to determine if BP medication is necessary -Request ropinirole 2 mg dose from PCP -Advised pt he can stop Vitamin E and cranberry supplement due to lack of established benefit  Plan: -Pharmacist follow up televisit scheduled for 1 month (BP check)   Subjective: Mark [name] is an 65 y.o. year old male who is a primary patient of Ria Bush, MD.  The CCM team was consulted for assistance with disease management and care coordination needs.    Engaged with patient by telephone for initial visit in response to provider referral for pharmacy case management and/or care coordination services.   Consent to Services:  The patient was given the following information about Chronic Care Management services today, agreed to services, and gave verbal consent: 1. CCM service includes personalized support from designated clinical staff supervised by the primary care provider, including individualized plan of care and coordination with other care providers 2. 24/7 contact phone numbers for assistance for urgent and routine care needs. 3. Service will only be billed when office clinical staff spend 20 minutes or more in a month to coordinate care. 4. Only one practitioner may furnish and bill the service in a calendar month. 5.The patient may stop CCM services at any time (effective at the end of the month) by  phone call to the office staff. 6. The patient will be responsible for cost sharing (co-pay) of up to 20% of the service fee (after annual deductible is met). Patient agreed to services and consent obtained.  Patient Care Team: Ria Bush, MD as PCP - General (Family Medicine) Hyman Hopes, [place], [place] as Referring Physician (Dentistry) Milinda Pointer, MD as Referring Physician (Pain Medicine) Teodoro Spray, MD as Consulting Physician (Cardiology) Laverle Hobby, MD as Consulting Physician (Pulmonary Disease) Abbie Sons, MD (Urology) Leonie Green, OD as Referring Physician (Optometry) Charlton Haws, [facility] as Pharmacist (Pharmacist)  Patient lives alone, his mom lives next door and cooks for him occasionally. She had a stroke last year and he helps with her medications as well.  Lightheadedness/vertigo - changing position  PB, pizza, hamburgers, mac&cheese, chili dog Pinto beans, baked beans  Recent office visits: 09/14/20 Dr Danise Mina OV: annual exam; referred to GI for Cologard  Recent consult visits: 06/22/21-Pain Management- Francisco Naveira,MD-Patient presented for left shoulder pain,pain medication management. 05/25/21-Cardiology-Kenneth Fath,MD-Patient presented for pacemaker evaluation. Normal function, no medication changes  03/23/21-Pain Management-Francisco Naveira,MD-Patient presented for follow up pain medication management.  Hospital visits: None in previous 6 months   Objective:  Lab Results  Component Value Date   CREATININE 0.89 10/29/2020   BUN 13 10/29/2020   GFR 91.41 09/07/2020   GFRNONAA >60 10/29/2020   GFRAA >60 12/06/2017   NA 137 10/29/2020   K 3.7 10/29/2020   CALCIUM 8.6 (L) 10/29/2020   CO2 22 10/29/2020   GLUCOSE 140 (H) 10/29/2020  Lab Results  Component Value Date/Time   HGBA1C 5.6 09/07/2020 01:16 PM   HGBA1C 6.2 08/13/2019 07:50 AM   GFR 91.41 09/07/2020 01:16 PM   GFR 73.03 08/13/2019 07:50 AM    MICROALBUR 12.9 (H) 04/28/2014 10:35 AM   MICROALBUR 1.1 04/22/2013 10:04 AM    Last diabetic Eye exam: No results found for: HMDIABEYEEXA  Last diabetic Foot exam: No results found for: HMDIABFOOTEX   Lab Results  Component Value Date   CHOL 109 09/07/2020   HDL 40.00 09/07/2020   LDLCALC 46 09/07/2020   LDLDIRECT 128.0 05/07/2015   TRIG 118.0 09/07/2020   CHOLHDL 3 09/07/2020    Hepatic Function Latest Ref Rng & Units 10/29/2020 10/27/2020 10/26/2020  Total Protein 6.5 - 8.1 g/dL 5.8(L) 5.4(L) 5.3(L)  Albumin 3.5 - 5.0 g/dL 2.6(L) 2.6(L) 2.6(L)  AST 15 - 41 U/L 137(H) 137(H) 133(H)  ALT 0 - 44 U/L 163(H) 157(H) 167(H)  Alk Phosphatase 38 - 126 U/L 170(H) 189(H) 143(H)  Total Bilirubin 0.3 - 1.2 mg/dL 2.6(H) 5.3(H) 2.8(H)    Lab Results  Component Value Date/Time   TSH 2.05 08/09/2018 08:22 AM   TSH 3.99 02/13/2017 10:17 AM    CBC Latest Ref Rng & Units 10/29/2020 10/28/2020 10/26/2020  WBC 4.0 - 10.5 K/uL 14.1(H) 15.7(H) 9.2  Hemoglobin 13.0 - 17.0 g/dL 14.8 14.1 14.1  Hematocrit 39.0 - 52.0 % 43.7 40.4 38.7(L)  Platelets 150 - 400 K/uL 151 163 135(L)    No results found for: VD25OH  Clinical ASCVD: No  The ASCVD Risk score (Arnett DK, et al., 2019) failed to calculate for the following reasons:   The valid total cholesterol range is 130 to 320 mg/dL    Depression screen [facility] 2/9 09/08/2020 08/27/2020 03/11/2020  Decreased Interest 0 0 0  Down, Depressed, Hopeless 0 0 0  PHQ - 2 Score 0 0 0  Altered sleeping - 0 -  Tired, decreased energy - 0 -  Change in appetite - 0 -  Feeling bad or failure about yourself  - 0 -  Trouble concentrating - 0 -  Moving slowly or fidgety/restless - 0 -  Suicidal thoughts - 0 -  PHQ-9 Score - 0 -  Difficult doing work/chores - Not difficult at all -  Some recent data might be hidden    GAD 7 : Generalized Anxiety Score 02/11/2018  Nervous, Anxious, on Edge 1  Control/stop worrying 0  Worry too much - different things 1  Trouble  relaxing 1  Restless 0  Easily annoyed or irritable 0  Afraid - awful might happen 0  Total GAD 7 Score 3      Social History   Tobacco Use  Smoking Status Never  Smokeless Tobacco Never   BP Readings from Last 3 Encounters:  06/22/21 (!) 146/81  03/23/21 (!) 144/84  12/08/20 138/79   Pulse Readings from Last 3 Encounters:  06/22/21 84  03/23/21 62  12/08/20 64   Wt Readings from Last 3 Encounters:  06/22/21 225 lb (102.1 kg)  03/23/21 215 lb (97.5 kg)  12/08/20 215 lb (97.5 kg)   BMI Readings from Last 3 Encounters:  06/22/21 35.24 kg/m  03/23/21 33.67 kg/m  12/08/20 33.67 kg/m    Assessment/Interventions: Review of patient past medical history, allergies, medications, health status, including review of consultants reports, laboratory and other test data, was performed as part of comprehensive evaluation and provision of chronic care management services.   SDOH:  (Social Determinants of Health) assessments  and interventions performed: Yes  SDOH Screenings   Alcohol Screen: Low Risk    Last Alcohol Screening Score (AUDIT): 0  Depression (PHQ2-9): Low Risk    PHQ-2 Score: 0  Financial Resource Strain: Low Risk    Difficulty of Paying Living Expenses: Not hard at all  Food Insecurity: No Food Insecurity   Worried About Charity fundraiser in the Last Year: Never true   Ran Out of Food in the Last Year: Never true  Housing: Low Risk    Last Housing Risk Score: 0  Physical Activity: Sufficiently Active   Days of Exercise per Week: 7 days   Minutes of Exercise per Session: 30 min  Social Connections: Not on file  Stress: No Stress Concern Present   Feeling of Stress : Not at all  Tobacco Use: Low Risk    Smoking Tobacco Use: Never   Smokeless Tobacco Use: Never   Passive Exposure: Not on file  Transportation Needs: No Transportation Needs   Lack of Transportation (Medical): No   Lack of Transportation (Non-Medical): No    CCM Care Plan  Allergies   Allergen Reactions   Ciprofloxacin Other (See Comments)    PANIC ATTACKS (Fluoroquinolones)    Metformin Other (See Comments)    Bad acid reflux   Buprenorphine Hcl Nausea Only   Morphine And Related Nausea Only   Penicillins Rash    Rash Has patient had a PCN reaction causing immediate rash, facial/tongue/throat swelling, SOB or lightheadedness with hypotension: Unknown Has patient had a PCN reaction causing severe rash involving mucus membranes or skin necrosis: Unknown Has patient had a PCN reaction that required hospitalization: No Has patient had a PCN reaction occurring within the last 10 years: No CHILDHOOD REACTION. If all of the above answers are "NO", then may proceed with Cephalosporin use.     Medications Reviewed Today     Reviewed by Milinda Pointer, MD (Physician) on 06/22/21 at [phone]  Med List Status: <None>   Medication Order Taking? Sig Documenting Provider Last Dose Status Informant  Ascorbic Acid (VITAMIN C) 1000 MG tablet [id number] Yes Take 1,000 mg by mouth daily. [provider] Taking Active Self  aspirin 81 MG tablet [id number] Yes Take 81 mg by mouth daily. [provider] Taking Active Self  atorvastatin (LIPITOR) 20 MG tablet [id number] Yes Take 1 tablet by mouth at bedtime. [provider] Taking Active   cetirizine (ZYRTEC) 10 MG tablet [id number] Yes Take 10 mg by mouth at bedtime.  [provider] Taking Active Self  Cholecalciferol (VITAMIN D) 2000 units CAPS [id number] Yes Take 2,000 Units by mouth daily.  [provider] Taking Active Self  clindamycin (CLEOCIN) 150 MG capsule [id number] Yes Take 150 mg by mouth as needed. Prior to dental appt [provider] Taking Active   CRANBERRY PO [id number] Yes Take 4,200 mg by mouth at bedtime.  [provider] Taking Active Self           Med Note Maylene Roes Jun 21, 2016 10:22 AM)    cyclobenzaprine (FLEXERIL) 10 MG tablet [id number]  Yes TAKE ONE-HALF TO ONE TABLET BY MOUTH 2 TIMES DAILY AS NEEDED FOR MUSCLE SPASMS Ria Bush, MD Taking Active   dicyclomine (BENTYL) 20 MG tablet [id number] Yes Take 20 mg by mouth 2 (two) times daily as needed for spasms. [provider] Taking Active Self  diphenhydrAMINE (BENADRYL) 50 MG capsule [id number] Yes Take 50 mg by mouth at bedtime. [provider] Taking Active Self  ferrous sulfate 325 (65 FE) MG EC tablet [id number] Yes Take 325 mg by mouth daily with breakfast. [provider] Taking Active Self  FLUoxetine (PROZAC) 20 MG capsule [id number] Yes Take 1 capsule (20 mg total) by mouth daily.  Patient taking differently: Take 20 mg by mouth at bedtime.   Ria Bush, MD Taking Active   FLUoxetine [facility]) 40 MG capsule [id number] Yes Take 1 capsule (40 mg total) by mouth daily.  Patient taking differently: Take 40 mg by mouth in the morning.   Ria Bush, MD Taking Active   fluticasone [facility]) 50 MCG/ACT nasal spray [id number] Yes Place 2 sprays into both nostrils daily. Ria Bush, MD Taking Active Self  furosemide (LASIX) 20 MG tablet [id number] Yes Take 1 tablet (20 mg total) by mouth daily. Ria Bush, MD Taking Active Self  Garlic 3335 MG CAPS [id number] Yes Take 1,000 mg by mouth daily. [provider] Taking Active Self  Multiple Vitamins-Minerals (CERTAVITE SENIOR/ANTIOXIDANT) TABS [id number] Yes Take 1 tablet by mouth daily. [provider] Taking Active Self  naloxone [facility]) 4 MG/0.1ML LIQD nasal spray kit [id number] Yes Spray into one nostril. Repeat with second device into other nostril after 2-3 minutes if no or minimal response. Milinda Pointer, MD Taking Active Self  naproxen (NAPROSYN) 500 MG tablet [id number] Yes TAKE 1 TABLET BY MOUTH 2 TIMES DAILY AS NEEDED FOR MODERATE PAIN Ria Bush, MD Taking Active   Omega-3 1000 MG CAPS [id number] Yes Take 1,000 mg by mouth at bedtime. [provider] Taking Active Self  omeprazole (PRILOSEC) 20 MG capsule [id number] Yes Take 1 capsule (20 mg total) by mouth daily as needed (GERD). Ria Bush, MD Taking Active Self  Oxycodone HCl 10 MG TABS [id number] Yes Take 1 tablet (10 mg total) by mouth every 6 (six) hours as needed. Milinda Pointer, MD Taking Active   Oxycodone HCl 10 MG TABS [id number] Yes Take 1 tablet (10 mg total) by mouth every 6 (six) hours as needed. Milinda Pointer, MD Taking Active   Oxycodone HCl 10 MG TABS [id number] Yes Take 1 tablet (10 mg total) by mouth every 6 (six) hours as needed. Milinda Pointer, MD Taking Active            Med Note Dossie Arbour, [place] A   Wed Jun 22, 2021  9:25 AM) WARNING: Not a Duplicate. Future prescription. DO NOT DELETE during hospital medication reconciliation or at discharge. [facility] Chronic Pain Management Patient   potassium citrate (UROCIT-K) 10 MEQ (1080 MG) SR tablet [id number] Yes Take 1 tablet (10 mEq total) by mouth 2 (two) times daily. Abbie Sons, MD Taking Active   rOPINIRole (REQUIP) 1 MG tablet [id number] Yes Take 1 tablet (1 mg total) by mouth at bedtime. Ria Bush, MD Taking Active Self  Saw Palmetto, Serenoa repens, (SAW PALMETTO PO) [id number] Yes Take 450 mg by mouth 2 (two) times daily.  [provider] Taking Active Self  VITAMIN B COMPLEX-C CAPS [id number] Yes Take 1 capsule by mouth daily. [provider] Taking Active Self           Med Note Maylene Roes Jun 21, 2016 10:25 AM)    vitamin E 1000 UNIT capsule [id number] Yes Take 1,000 Units by mouth daily.  [provider] Taking Active Self  Med List Note Sanda Klein 03/23/21 [id number]): MR 06-22-2021 UDS 12-08-2020             Patient Active  Problem List   Diagnosis Date Noted   DDD (degenerative disc disease), cervical 03/16/2021   Chronic low back pain (Bilateral) w/o sciatica 03/16/2021   Chronic shoulder pain (Bilateral) (L>R)  03/16/2021   History of laparoscopic cholecystectomy (10/27/2020) 12/08/2020   Pacemaker 10/22/2020   Chronic diastolic heart failure ([place]) 10/22/2020   Prediabetes 10/22/2020   Serum lipase elevation 10/22/2020   Transaminasemia 00/93/8182   Uncomplicated opioid dependence ([place]) 09/08/2020   Trigger point with back pain (Left) 03/30/2020   Trigger point of shoulder region (scapula) (Left) 03/30/2020   Osteoarthritis of first carpometacarpal joint (thumb) of hand (Left) 03/11/2020   Chronic thumb pain (Left) 03/03/2020   Trigger middle finger of right hand 03/03/2020   Chronic hand pain (Left) 03/03/2020   Pharmacologic therapy 12/02/2019   Disorder of skeletal system 12/02/2019   Problems influencing health status 12/02/2019   Cervicalgia 02/12/2019   Trigger finger of thumb (Right) 02/12/2019   GERD (gastroesophageal reflux disease) 09/24/2018   IDA (iron deficiency anemia) 09/24/2018   Erectile dysfunction due to arterial insufficiency 09/12/2018   Trigger finger of index finger (Right) 08/16/2018   RLS (restless legs syndrome) 02/11/2018   Immunization deficiency 02/11/2018   Positive colorectal cancer screening using Cologuard test 09/21/2017   Low testosterone in male 02/18/2017   Chronic fatigue 02/08/2017   Advanced care planning/counseling discussion 08/11/2016   Chronic cervical radicular pain 06/21/2016   Long term current use of opiate analgesic 05/15/2016   OSA (obstructive sleep apnea) 04/11/2016   Hypocitraturia 03/16/2016   Cervical foraminal stenosis (Severe C6-7) (Left) 02/02/2016   Chronic sacroiliac joint pain (Bilateral) 02/02/2016   Osteoarthritis of sacroiliac joint (Bilateral) 02/02/2016   Lumbar facet hypertrophy (L1-2, L2-3, and L4-5) (Bilateral) 02/02/2016   Lumbar IVDD (intervertebral disc displacement) 02/02/2016   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) 02/02/2016   Lumbar facet syndrome (Bilateral) 02/02/2016   Osteoarthritis of shoulder  (Left) 02/02/2016   Osteoarthritis of knee (Bilateral) (R>L) 02/02/2016   Radicular pain of shoulder (Bilateral) (L>R) 09/01/2015   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) 09/01/2015    Class: History of   Cervicogenic headache 09/01/2015   Cervical spondylosis with radiculopathy (Bilateral) (L>R) 09/01/2015   Cervical facet syndrome (Bilateral) (L>R) 09/01/2015   Opiate use (60 MME/Day) 05/31/2015   Encounter for therapeutic drug level monitoring 05/31/2015   Chronic neck pain (1ry area of Pain) (Bilateral) (L>R) 05/31/2015   Chronic knee pain (Bilateral) (R>L) 05/31/2015   History of permanent cardiac pacemaker placement 05/31/2015   Opioid-induced constipation (OIC) 05/31/2015   Health maintenance examination 05/07/2015   Candidal dermatitis 05/07/2015   Bradycardia 02/16/2014   Cardiac murmur 02/16/2014   Severe obesity (BMI 35.0-39.9) with comorbidity ([place]) 04/22/2013   Benign prostatic hyperplasia with urinary obstruction 05/28/2012   Nephrolithiasis 05/28/2012   Medicare annual wellness visit, subsequent 03/27/2012   HLD (hyperlipidemia) 03/27/2012   GAD (generalized anxiety disorder) 12/25/2011   Insomnia 09/27/2011   Chronic pain syndrome 03/28/2011   MDD (major depressive disorder), recurrent episode, moderate ([place]) 03/28/2011    Immunization History  Administered Date(s) Administered   Influenza, High Dose Seasonal PF 05/04/2021   Influenza,inj,Quad PF,6+ Mos 04/22/2013, 04/28/2014, 05/07/2015, 04/11/2016, 05/22/2017, 05/16/2018   Influenza-Unspecified 06/16/2019, 04/15/2020   Moderna Covid-19 Vaccine Bivalent Booster 84yr & up 05/05/2021   Moderna Sars-Covid-2 Vaccination 11/06/2019, 12/04/2019, 07/07/2020   Pneumococcal Polysaccharide-23 05/30/2006   Tdap 11/19/2009, 04/15/2020    Conditions to be addressed/monitored:  Hypertension, Hyperlipidemia, Heart Failure, Depression, and Allergic Rhinitis, RLS  Care Plan : CCM Pharmacy Care Plan   Updates made by Charlton Haws, RPH since 07/15/2021 12:00 AM     Problem: Hypertension, Hyperlipidemia, Heart Failure, Depression, and Allergic Rhinitis, RLS   Priority: High     Long-Range Goal: Disease mgmt   Start Date: 07/15/2021  Expected End Date: 07/15/2022  This Visit's Progress: On track  Priority: High  Note:   Current Barriers:  Unable to independently monitor therapeutic efficacy  Pharmacist Clinical Goal(s):  Patient will achieve adherence to monitoring guidelines and medication adherence to achieve therapeutic efficacy through collaboration with PharmD and provider.   Interventions: 1:1 collaboration with Ria Bush, MD regarding development and update of comprehensive plan of care as evidenced by provider attestation and co-signature Inter-disciplinary care team collaboration (see longitudinal plan of care) Comprehensive medication review performed; medication list updated in electronic medical record  Hyperlipidemia: (LDL goal < 100) -Controlled - baseline LDL 130, currently 46; pt endorses compliance with statin -Current treatment: Atorvastatin 20 mg daily HS Omega-3 1000 mg Aspirin 81 mg daily -Educated on Cholesterol goals; Benefits of statin for ASCVD risk reduction; -Recommended to continue current medication  Heart Failure (Goal: manage symptoms and prevent exacerbations) -Not ideally controlled - BP elevated in office; pt is not checking consistently at home, only when he feels "off"; he reports when he is "swimmy headed" his BP is actually higher than normal;  -Hx heart block s/p dual chamber pacemaker -Last ejection fraction: 55-65% (Date: 10/2017) -HF type: Diastolic -Current treatment: Furosemide 20 mg daily -Current home BP/HR readings: pt checks BP when he feels "off" 11/28 162/86; 172/88; 156/94 (P 100) 11/27 123/78 11/26 144/80  146/80  135/81  160/78  157/80  188/64 -Educated on BP goals and benefits of medications for  prevention of heart attack, stroke and kidney damage; Daily salt intake goal < 2300 mg; Importance of home blood pressure monitoring; Proper BP monitoring technique; -Counseled to monitor BP at home daily; f/u 1 month, consider additional BP med if needed -Recommended to continue current medication  Depression/Anxiety (Goal: manage symptoms) -Controlled - pt has been on fluoxetine since at least 2013; he reports is is working "well enough" currently -Current treatment: Fluoxetine 20 mg daily HS Fluoxetine 40 mg daily AM -PHQ9: 0 (09/2020) -GAD7: 3 (02/2018) -Connected with PCP for mental health support -Educated on Benefits of medication for symptom control -Recommended to continue current medication  Chronic pain (Goal: manage pain) -Controlled - per pt report -cervical radiculopathy; lumbar disc displacement, OA of knee, shoulder -Follows with Dr Dossie Arbour (pain mgmt) -Current treatment  Oxycodone 10 m q6h (#120/month): 2-3 times per day Naproxen 500 mg BID prn - takes once a day Cyclobenzaprine 10 mg BID prn Narcan PRN -Educated on risks of chronic NSAID use including elevated BP and kidney damage; discussed naproxen could be contributing to high BP at home; would avoid Tylenol due to recently elevated LFTs -Recommend to limit naproxen use as much as possible; try topical analgesics (Voltaren, Aspercreme)  RLS (Goal: manage symptoms) -Controlled - pt reports sx improved since self-increasing to 2 mg; he asked if PCP could send new dose for 2 mg tablet -Current treatment  Ropinirole 1 mg HS -Discussed maximum dose of ropinirole is 4 mg/day; 31m dose is reasonable -Recommend updating Rx to ropinirole 2 mg HS  GERD / GI (Goal: manage symptoms) -Controlled - per pt report -Current treatment  Omeprazole 20 mg daily Dicyclomine 20 mg BID prn spasms - rare use -Recommended to continue current medication  Allergic rhinitis (Goal: manage symptoms) -Controlled - per pt report; he does  endorse dry mouth after taking meds in AM and sips on water throughout the day to help with this -Current treatment  Cetirizine 10 mg daily HS Benadryl 25 mg PRN - once every 1-2 weeks Fluticasone nasal spray  -Counseled Benadryl can cause dry mouth; however pt is not using very often so this is unlikely to be the cause of his frequent dry mouth -Recommended to continue current medication  Renal stones (Goal: prevent recurrence) -Controlled  - pt follows with urology; he endorses compliance with potassium citrate -Current treatment  Potassium citrate 10 mEq BID (renal stones) -Discussed role of potassium citrate in controlling/preventing renal stones -Recommended to continue current medication  Health Maintenance -Vaccine gaps: Flu, Covid booster, Prevnar, Shingrix -Updated flu and covid vaccines in record; advised pt get Prevnar vaccine at next office visit -Current therapy:  Ferrous sulfate 325 mg daily Cranberry Garlic 1610 mg Saw Palmetto BID Vitamin C 1000 mg Vitamin D 2000 IU Multivitamin Vitamin B-C complex Vitamin E 1000 unit -Educated on Herbal supplement research is limited and benefits usually cannot be proven; Cost vs benefit of each product must be carefully weighed by individual consumer -Advised pt he can stop Vitamin E and Cranberry due to lack of established benefit  Patient Goals/Self-Care Activities Patient will:  - take medications as prescribed as evidenced by patient report and record review focus on medication adherence by pill box check blood pressure daily, document, and provide at future appointments -Limit naproxen as much as possible -can stop Vitamin E and cranberry supplement      Medication Assistance: None required.  Patient affirms current coverage meets needs.  Compliance/Adherence/Medication fill history: Care Gaps: Cologuard (due 09/21/20) - planning to set up colonoscopy in 2023 AWV due  Star-Rating Drugs: Atorvastatin 20 mg - LF  05/16/21 x 90 ds; [place] 100%  Patient's preferred pharmacy is:  [place], [place] [address] [place] [address] Phone: [phone] Fax: [phone]  [facility] [place], [place] [address] Phone: [phone] Fax: [phone]  Uses pill box? Yes Pt endorses 100% compliance  We discussed: Current pharmacy is preferred with insurance plan and patient is satisfied with pharmacy services Patient decided to: Continue current medication management strategy  Care Plan and Follow Up Patient Decision:  Patient agrees to Care Plan and Follow-up.  Plan: Telephone follow up appointment with care management team member scheduled for:  1 month  Charlene Brooke, PharmD, [facility] Clinical Pharmacist [facility] [phone]

## 2021-07-08 ENCOUNTER — Telehealth: Payer: Self-pay

## 2021-07-08 NOTE — Chronic Care Management (AMB) (Signed)
Chronic Care Management Pharmacy Assistant   Name: QAIS JOWERS  MRN: [id number] DOB: 1955/08/13  Mark [name] is an 65 y.o. year old male who presents for his initial CCM visit with the clinical pharmacist.  Reason for Encounter: Initial Questions    Conditions to be addressed/monitored: HLD, heart failure   Recent office visits:  None in the past 6 months  Recent consult visits:  06/22/21-Pain Management- Francisco Naveira,MD-Patient presented for left shoulder pain,pain medication management. 05/25/21-Cardiology-Kenneth Fath,MD-Patient presented for pacemaker evaluation. Normal function, no medication changes  03/23/21-Pain Management-Francisco Naveira,MD-Patient presented for follow up pain medication management.  Hospital visits:  None in previous 6 months  Medications: Outpatient Encounter Medications as of 07/08/2021  Medication Sig Note   Ascorbic Acid (VITAMIN C) 1000 MG tablet Take 1,000 mg by mouth daily.    aspirin 81 MG tablet Take 81 mg by mouth daily.    atorvastatin (LIPITOR) 20 MG tablet Take 1 tablet by mouth at bedtime.    cetirizine (ZYRTEC) 10 MG tablet Take 10 mg by mouth at bedtime.     Cholecalciferol (VITAMIN D) 2000 units CAPS Take 2,000 Units by mouth daily.     clindamycin (CLEOCIN) 150 MG capsule Take 150 mg by mouth as needed. Prior to dental appt    CRANBERRY PO Take 4,200 mg by mouth at bedtime.     cyclobenzaprine (FLEXERIL) 10 MG tablet TAKE ONE-HALF TO ONE TABLET BY MOUTH 2 TIMES DAILY AS NEEDED FOR MUSCLE SPASMS    dicyclomine (BENTYL) 20 MG tablet Take 20 mg by mouth 2 (two) times daily as needed for spasms.    diphenhydrAMINE (BENADRYL) 50 MG capsule Take 50 mg by mouth at bedtime.    ferrous sulfate 325 (65 FE) MG EC tablet Take 325 mg by mouth daily with breakfast.    FLUoxetine (PROZAC) 20 MG capsule Take 1 capsule (20 mg total) by mouth daily. (Patient taking differently: Take 20 mg by mouth at bedtime.)    FLUoxetine (PROZAC) 40  MG capsule Take 1 capsule (40 mg total) by mouth daily. (Patient taking differently: Take 40 mg by mouth in the morning.)    fluticasone (FLONASE) 50 MCG/ACT nasal spray Place 2 sprays into both nostrils daily.    furosemide (LASIX) 20 MG tablet Take 1 tablet (20 mg total) by mouth daily.    Garlic 2563 MG CAPS Take 1,000 mg by mouth daily.    Multiple Vitamins-Minerals (CERTAVITE SENIOR/ANTIOXIDANT) TABS Take 1 tablet by mouth daily.    naloxone (NARCAN) 4 MG/0.1ML LIQD nasal spray kit Spray into one nostril. Repeat with second device into other nostril after 2-3 minutes if no or minimal response.    naproxen (NAPROSYN) 500 MG tablet TAKE 1 TABLET BY MOUTH 2 TIMES DAILY AS NEEDED FOR MODERATE PAIN    Omega-3 1000 MG CAPS Take 1,000 mg by mouth at bedtime.    omeprazole (PRILOSEC) 20 MG capsule Take 1 capsule (20 mg total) by mouth daily as needed (GERD).    Oxycodone HCl 10 MG TABS Take 1 tablet (10 mg total) by mouth every 6 (six) hours as needed.    [START ON 07/27/2021] Oxycodone HCl 10 MG TABS Take 1 tablet (10 mg total) by mouth every 6 (six) hours as needed.    [START ON 08/26/2021] Oxycodone HCl 10 MG TABS Take 1 tablet (10 mg total) by mouth every 6 (six) hours as needed. 06/22/2021: WARNING: Not a Duplicate. Future prescription. DO NOT DELETE during hospital medication reconciliation or  at discharge. [facility] Chronic Pain Management Patient    potassium citrate (UROCIT-K) 10 MEQ (1080 MG) SR tablet Take 1 tablet (10 mEq total) by mouth 2 (two) times daily.    rOPINIRole (REQUIP) 1 MG tablet Take 1 tablet (1 mg total) by mouth at bedtime.    Saw Palmetto, Serenoa repens, (SAW PALMETTO PO) Take 450 mg by mouth 2 (two) times daily.     VITAMIN B COMPLEX-C CAPS Take 1 capsule by mouth daily.    vitamin E 1000 UNIT capsule Take 1,000 Units by mouth daily.     No facility-administered encounter medications on file as of 07/08/2021.    Lab Results  Component Value Date/Time   HGBA1C 5.6  09/07/2020 01:16 PM   HGBA1C 6.2 08/13/2019 07:50 AM   MICROALBUR 12.9 (H) 04/28/2014 10:35 AM   MICROALBUR 1.1 04/22/2013 10:04 AM     BP Readings from Last 3 Encounters:  06/22/21 (!) 146/81  03/23/21 (!) 144/84  12/08/20 138/79    Patient contacted to review initial questions prior to visit with Charlene Brooke.  Have you seen any other providers since your last visit with PCP? Yes  Pain medication management- Dr.Francisco Dossie Arbour, Cardiology  for pacemaker check   Any changes in your medications or health? No  Any side effects from any medications? Yes The patient reports dry mouth  Do you have an symptoms or problems not managed by your medications? No  Any concerns about your health right now? No  Has your provider asked that you check blood pressure, blood sugar, or follow special diet at home? Yes  The patient will check his BP at home   Do you get any type of exercise on a regular basis? Yes The patient states he walks the dog daily and he keeps up with his steps daily   Can you think of a goal you would like to reach for your health? Yes  The patient reports he tries to get 3000 steps a day   Do you have any problems getting your medications? No The patient reports he uses [facility]  mail order for his maintenance medications and [facility] for short time use medications and pain medication.   Is there anything that you would like to discuss during the appointment? No   Spoke with patient and reminded them to have all medications, supplements and any blood glucose and blood pressure readings available for review with pharmacist, at their telephone visit on 07/12/21 at 3:30pm.   Star Rating Drugs:  Medication:  Last Fill: Day Supply Atorvastatin 34m 05/16/21 90  Care Gaps: Annual wellness visit in last year? No Most Recent BP reading:146/81  84-P  06/22/21    LMarjo Bicker CPP notified  [place] Assistant [phone] Total time spent for month CPA: 40 min

## 2021-07-12 ENCOUNTER — Other Ambulatory Visit: Payer: Self-pay

## 2021-07-12 ENCOUNTER — Ambulatory Visit: Payer: Medicare Other | Admitting: Pharmacist

## 2021-07-12 DIAGNOSIS — R030 Elevated blood-pressure reading, without diagnosis of hypertension: Secondary | ICD-10-CM

## 2021-07-12 DIAGNOSIS — G2581 Restless legs syndrome: Secondary | ICD-10-CM

## 2021-07-12 DIAGNOSIS — F331 Major depressive disorder, recurrent, moderate: Secondary | ICD-10-CM

## 2021-07-12 DIAGNOSIS — K219 Gastro-esophageal reflux disease without esophagitis: Secondary | ICD-10-CM

## 2021-07-12 DIAGNOSIS — G894 Chronic pain syndrome: Secondary | ICD-10-CM

## 2021-07-12 DIAGNOSIS — E785 Hyperlipidemia, unspecified: Secondary | ICD-10-CM

## 2021-07-15 NOTE — Patient Instructions (Signed)
Visit Information  Phone number for Pharmacist: [phone]  Thank you for meeting with me to discuss your medications! I look forward to working with you to achieve your health care goals. Below is a summary of what we talked about during the visit:   Goals Addressed             This Visit's Progress    Track and Manage My Blood Pressure-Hypertension       Timeframe:  Short-Term Goal Priority:  High Start Date:       07/15/21                      Expected End Date:    08/15/21                   Follow Up Date Jan 2023   - check blood pressure daily - choose a place to take my blood pressure (home, clinic or office, retail store) - write blood pressure results in a log or diary    Why is this important?   You won't feel high blood pressure, but it can still hurt your blood vessels.  High blood pressure can cause heart or kidney problems. It can also cause a stroke.  Making lifestyle changes like losing a little weight or eating less salt will help.  Checking your blood pressure at home and at different times of the day can help to control blood pressure.  If the doctor prescribes medicine remember to take it the way the doctor ordered.  Call the office if you cannot afford the medicine or if there are questions about it.     Notes:         Care Plan : CCM Pharmacy Care Plan  Updates made by Kathyrn Sheriff, RPH since 07/15/2021 12:00 AM     Problem: Hypertension, Hyperlipidemia, Heart Failure, Depression, and Allergic Rhinitis, RLS   Priority: High     Long-Range Goal: Disease mgmt   Start Date: 07/15/2021  Expected End Date: 07/15/2022  This Visit's Progress: On track  Priority: High  Note:   Current Barriers:  Unable to independently monitor therapeutic efficacy  Pharmacist Clinical Goal(s):  Patient will achieve adherence to monitoring guidelines and medication adherence to achieve therapeutic efficacy through collaboration with PharmD and provider.    Interventions: 1:1 collaboration with [name], MD regarding development and update of comprehensive plan of care as evidenced by provider attestation and co-signature Inter-disciplinary care team collaboration (see longitudinal plan of care) Comprehensive medication review performed; medication list updated in electronic medical record  Hyperlipidemia: (LDL goal < 100) -Controlled - baseline LDL 130, currently 46; pt endorses compliance with statin -Current treatment: Atorvastatin 20 mg daily HS Omega-3 1000 mg Aspirin 81 mg daily -Educated on Cholesterol goals; Benefits of statin for ASCVD risk reduction; -Recommended to continue current medication  Heart Failure (Goal: manage symptoms and prevent exacerbations) -Not ideally controlled - BP elevated in office; pt is not checking consistently at home, only when he feels "off"; he reports when he is "swimmy headed" his BP is actually higher than normal;  -Hx heart block s/p dual chamber pacemaker -Last ejection fraction: 55-65% (Date: 10/2017) -HF type: Diastolic -Current treatment: Furosemide 20 mg daily -Current home BP/HR readings: pt checks BP when he feels "off" 11/28 162/86; 172/88; 156/94 (P 100) 11/27 123/78 11/26 144/80  146/80  135/81  160/78  157/80  188/64 -Educated on BP goals and benefits of medications for prevention of  heart attack, stroke and kidney damage; Daily salt intake goal < 2300 mg; Importance of home blood pressure monitoring; Proper BP monitoring technique; -Counseled to monitor BP at home daily; f/u 1 month, consider additional BP med if needed -Recommended to continue current medication  Depression/Anxiety (Goal: manage symptoms) -Controlled - pt has been on fluoxetine since at least 2013; he reports is is working "well enough" currently -Current treatment: Fluoxetine 20 mg daily HS Fluoxetine 40 mg daily AM -PHQ9: 0 (09/2020) -GAD7: 3 (02/2018) -Connected with PCP for mental health  support -Educated on Benefits of medication for symptom control -Recommended to continue current medication  Chronic pain (Goal: manage pain) -Controlled - per pt report -cervical radiculopathy; lumbar disc displacement, OA of knee, shoulder -Follows with Dr Laban Emperor (pain mgmt) -Current treatment  Oxycodone 10 m q6h (#120/month): 2-3 times per day Naproxen 500 mg BID prn - takes once a day Cyclobenzaprine 10 mg BID prn Narcan PRN -Educated on risks of chronic NSAID use including elevated BP and kidney damage; discussed naproxen could be contributing to high BP at home; would avoid Tylenol due to recently elevated LFTs -Recommend to limit naproxen use as much as possible; try topical analgesics (Voltaren, Aspercreme)  RLS (Goal: manage symptoms) -Controlled - pt reports sx improved since self-increasing to 2 mg; he asked if PCP could send new dose for 2 mg tablet -Current treatment  Ropinirole 1 mg HS -Discussed maximum dose of ropinirole is 4 mg/day; 2mg  dose is reasonable -Recommend updating Rx to ropinirole 2 mg HS  GERD / GI (Goal: manage symptoms) -Controlled - per pt report -Current treatment  Omeprazole 20 mg daily Dicyclomine 20 mg BID prn spasms - rare use -Recommended to continue current medication  Allergic rhinitis (Goal: manage symptoms) -Controlled - per pt report; he does endorse dry mouth after taking meds in AM and sips on water throughout the day to help with this -Current treatment  Cetirizine 10 mg daily HS Benadryl 25 mg PRN - once every 1-2 weeks Fluticasone nasal spray  -Counseled Benadryl can cause dry mouth; however pt is not using very often so this is unlikely to be the cause of his frequent dry mouth -Recommended to continue current medication  Renal stones (Goal: prevent recurrence) -Controlled  - pt follows with urology; he endorses compliance with potassium citrate -Current treatment  Potassium citrate 10 mEq BID (renal stones) -Discussed role  of potassium citrate in controlling/preventing renal stones -Recommended to continue current medication  Health Maintenance -Vaccine gaps: Flu, Covid booster, Prevnar, Shingrix -Updated flu and covid vaccines in record; advised pt get Prevnar vaccine at next office visit -Current therapy:  Ferrous sulfate 325 mg daily Cranberry Garlic 1000 mg Saw Palmetto BID Vitamin C 1000 mg Vitamin D 2000 IU Multivitamin Vitamin B-C complex Vitamin E 1000 unit -Educated on Herbal supplement research is limited and benefits usually cannot be proven; Cost vs benefit of each product must be carefully weighed by individual consumer -Advised pt he can stop Vitamin E and Cranberry due to lack of established benefit  Patient Goals/Self-Care Activities Patient will:  - take medications as prescribed as evidenced by patient report and record review focus on medication adherence by pill box check blood pressure daily, document, and provide at future appointments -Limit naproxen as much as possible -can stop Vitamin E and cranberry supplement      Mr. Merryfield was given information about Chronic Care Management services today including:  CCM service includes personalized support from designated clinical staff supervised by  his physician, including individualized plan of care and coordination with other care providers 24/7 contact phone numbers for assistance for urgent and routine care needs. Standard insurance, coinsurance, copays and deductibles apply for chronic care management only during months in which we provide at least 20 minutes of these services. Most insurances cover these services at 100%, however patients may be responsible for any copay, coinsurance and/or deductible if applicable. This service may help you avoid the need for more expensive face-to-face services. Only one practitioner may furnish and bill the service in a calendar month. The patient may stop CCM services at any time (effective  at the end of the month) by phone call to the office staff.  Patient agreed to services and verbal consent obtained.   Patient verbalizes understanding of instructions provided today and agrees to view in MyChart.  Telephone follow up appointment with pharmacy team member scheduled for: 1 month  [name], PharmD, [facility] Clinical Pharmacist [facility] [phone]

## 2021-07-16 ENCOUNTER — Other Ambulatory Visit: Payer: Self-pay | Admitting: Family Medicine

## 2021-07-16 MED ORDER — ROPINIROLE HCL 2 MG PO TABS: 2.0000 mg | ORAL_TABLET | Freq: Every day | ORAL | 3 refills | Status: DC

## 2021-07-16 NOTE — Progress Notes (Signed)
See CCM note - will increase requip to 2mg  nightly.

## 2021-08-10 ENCOUNTER — Telehealth: Payer: Self-pay

## 2021-08-10 NOTE — Progress Notes (Signed)
Chronic Care Management Pharmacy Assistant   Name: RANULFO KALL  MRN: [id number] DOB: 1955-10-28  Reason for Encounter: CCM (Appointment Reminder)   Medications: Outpatient Encounter Medications as of 08/10/2021  Medication Sig Note   Ascorbic Acid (VITAMIN C) 1000 MG tablet Take 1,000 mg by mouth daily.    aspirin 81 MG tablet Take 81 mg by mouth daily.    atorvastatin (LIPITOR) 20 MG tablet Take 1 tablet by mouth at bedtime.    cetirizine (ZYRTEC) 10 MG tablet Take 10 mg by mouth at bedtime.     Cholecalciferol (VITAMIN D) 2000 units CAPS Take 2,000 Units by mouth daily.     clindamycin (CLEOCIN) 150 MG capsule Take 150 mg by mouth as needed. Prior to dental appt    CRANBERRY PO Take 4,200 mg by mouth at bedtime.     cyclobenzaprine (FLEXERIL) 10 MG tablet TAKE ONE-HALF TO ONE TABLET BY MOUTH 2 TIMES DAILY AS NEEDED FOR MUSCLE SPASMS    dicyclomine (BENTYL) 20 MG tablet Take 20 mg by mouth 2 (two) times daily as needed for spasms.    diphenhydrAMINE (BENADRYL) 50 MG capsule Take 50 mg by mouth at bedtime.    ferrous sulfate 325 (65 FE) MG EC tablet Take 325 mg by mouth daily with breakfast.    FLUoxetine (PROZAC) 20 MG capsule Take 1 capsule (20 mg total) by mouth daily. (Patient taking differently: Take 20 mg by mouth at bedtime.)    FLUoxetine (PROZAC) 40 MG capsule Take 1 capsule (40 mg total) by mouth daily. (Patient taking differently: Take 40 mg by mouth in the morning.)    fluticasone (FLONASE) 50 MCG/ACT nasal spray Place 2 sprays into both nostrils daily.    furosemide (LASIX) 20 MG tablet Take 1 tablet (20 mg total) by mouth daily.    Garlic 4562 MG CAPS Take 1,000 mg by mouth daily.    Multiple Vitamins-Minerals (CERTAVITE SENIOR/ANTIOXIDANT) TABS Take 1 tablet by mouth daily.    naloxone (NARCAN) 4 MG/0.1ML LIQD nasal spray kit Spray into one nostril. Repeat with second device into other nostril after 2-3 minutes if no or minimal response.    naproxen (NAPROSYN) 500 MG  tablet TAKE 1 TABLET BY MOUTH 2 TIMES DAILY AS NEEDED FOR MODERATE PAIN    Omega-3 1000 MG CAPS Take 1,000 mg by mouth at bedtime.    omeprazole (PRILOSEC) 20 MG capsule Take 1 capsule (20 mg total) by mouth daily as needed (GERD).    Oxycodone HCl 10 MG TABS Take 1 tablet (10 mg total) by mouth every 6 (six) hours as needed.    Oxycodone HCl 10 MG TABS Take 1 tablet (10 mg total) by mouth every 6 (six) hours as needed.    [START ON 08/26/2021] Oxycodone HCl 10 MG TABS Take 1 tablet (10 mg total) by mouth every 6 (six) hours as needed. 06/22/2021: WARNING: Not a Duplicate. Future prescription. DO NOT DELETE during hospital medication reconciliation or at discharge. [facility] Chronic Pain Management Patient    potassium citrate (UROCIT-K) 10 MEQ (1080 MG) SR tablet Take 1 tablet (10 mEq total) by mouth 2 (two) times daily.    rOPINIRole (REQUIP) 2 MG tablet Take 1 tablet (2 mg total) by mouth at bedtime.    Saw Palmetto, Serenoa repens, (SAW PALMETTO PO) Take 450 mg by mouth 2 (two) times daily.     VITAMIN B COMPLEX-C CAPS Take 1 capsule by mouth daily.    vitamin E 1000 UNIT capsule Take 1,000  Units by mouth daily.     No facility-administered encounter medications on file as of 08/10/2021.   BRENTT FREAD was contacted to remind him of his upcoming telephone visit with Charlene Brooke on 08/15/2021 at 3:30 pm. Patient was reminded to have all medications, supplements and any blood glucose and blood pressure readings available for review at appointment.  Are you having any problems with your medications? No  Do you have any concerns you like to discuss with the pharmacist? No  Star Rating Drugs: Medication:  Last Fill: Day Supply Atorvastatin 20 mg 08/02/2021 Laie, CPP notified  Marijean Niemann, [place]  Time Spent:  10 Minutes

## 2021-08-15 ENCOUNTER — Ambulatory Visit (INDEPENDENT_AMBULATORY_CARE_PROVIDER_SITE_OTHER): Payer: Medicare Other | Admitting: Pharmacist

## 2021-08-15 ENCOUNTER — Other Ambulatory Visit: Payer: Self-pay

## 2021-08-15 ENCOUNTER — Telehealth: Payer: Self-pay | Admitting: Pharmacist

## 2021-08-15 DIAGNOSIS — F331 Major depressive disorder, recurrent, moderate: Secondary | ICD-10-CM

## 2021-08-15 DIAGNOSIS — R030 Elevated blood-pressure reading, without diagnosis of hypertension: Secondary | ICD-10-CM

## 2021-08-15 DIAGNOSIS — G894 Chronic pain syndrome: Secondary | ICD-10-CM

## 2021-08-15 DIAGNOSIS — E785 Hyperlipidemia, unspecified: Secondary | ICD-10-CM

## 2021-08-15 NOTE — Telephone Encounter (Signed)
Patient has been checking BP twice daily for several weeks. BP range 126/73 - 160/84 (see below). He has history of diastolic heart failure and ideal BP goal is < 130/80. I recommended starting BP medication (he only takes furosemide currently) however patient has not had PCP visit since [facility] 09/14/20, last labwork was 10/29/20 following cholecystectomy - pt will need repeat labwork before starting BP medication.  Advised patient to make appt to see PCP; he was unable to make appt on the phone today and states he will call next week for appt. Forwarding to PCP/assistant to schedule patient in case he forgets to call (this has happened before).  Home BP readings: checking AM and HS   AM   HS 08/15/21: 135/83  08/14/21: 147/80, 150/75 08/13/21: 141/87, 142/75 08/12/21: 138/85, 147/84 08/11/21: 124/80, 142/82 08/10/21: 126/73, 159/84 08/09/21: 129/77, 151/87 08/08/21: 157/77, 124/68 08/07/21: 140/82, 160/84 08/06/21: 136/85, 139/76 08/05/21: 133/83, 147/84  Average AM BP:  136/81 Average HS BP:  146/80 TOTAL Avg BP : 141/80

## 2021-08-15 NOTE — Progress Notes (Signed)
Chronic Care Management Pharmacy Note  08/15/2021 Name:  Mark [name] MRN:  [id number] DOB:  1956-01-21  Summary: -Home BP range 126/73-160/84 (Avg 141/80). BP goal with diastolic HF is < 226/33. He is only taking furosemide. He will resume chronic daily naproxen, would avoid ACE/ARB due to AKI risk. Pt is amenable to starting BP medication but is due for PCP visit (last OV 09/14/20, last labwork 10/29/20) -Pt has been off naproxen for ~4 weeks due to BP risks and reports joint pain is significantly worse. Would avoid Tylenol until we can can confirm LFTs have normalized after cholecystectomy last year  Recommendations/Changes made from today's visit: -Recommend CCB or thiazide  - however pt needs PCP visit/labwork prior to starting BP medication. Coordinating with office staff to schedule OV - see telephone note.  Plan: -Pharmacist follow up televisit scheduled for 1 month (BP check)   Subjective: Mark [name] is an 66 y.o. year old male who is a primary patient of Ria Bush, MD.  The CCM team was consulted for assistance with disease management and care coordination needs.    Engaged with patient by telephone for follow up visit in response to provider referral for pharmacy case management and/or care coordination services.   Consent to Services:  The patient was given information about Chronic Care Management services, agreed to services, and gave verbal consent prior to initiation of services.  Please see initial visit note for detailed documentation.   Patient Care Team: Ria Bush, MD as PCP - General (Family Medicine) Hyman Hopes, [place], [place] as Referring Physician (Dentistry) Milinda Pointer, MD as Referring Physician (Pain Medicine) Teodoro Spray, MD as Consulting Physician (Cardiology) Laverle Hobby, MD as Consulting Physician (Pulmonary Disease) Abbie Sons, MD (Urology) Leonie Green, OD as Referring Physician (Optometry) Charlton Haws, [facility] as Pharmacist (Pharmacist)  Patient lives alone, his mom lives next door and cooks for him occasionally. She had a stroke last year and he helps with her medications as well.  Recent office visits: 09/14/20 Dr Danise Mina OV: annual exam; referred to GI for Cologard  Recent consult visits: 06/22/21-Pain Management- Francisco Naveira,MD-Patient presented for left shoulder pain,pain medication management. 05/25/21-Cardiology-Kenneth Fath,MD-Patient presented for pacemaker evaluation. Normal function, no medication changes  03/23/21-Pain Management-Francisco Naveira,MD-Patient presented for follow up pain medication management.  Hospital visits: None in previous 6 months   Objective:  Lab Results  Component Value Date   CREATININE 0.89 10/29/2020   BUN 13 10/29/2020   GFR 91.41 09/07/2020   GFRNONAA >60 10/29/2020   GFRAA >60 12/06/2017   NA 137 10/29/2020   K 3.7 10/29/2020   CALCIUM 8.6 (L) 10/29/2020   CO2 22 10/29/2020   GLUCOSE 140 (H) 10/29/2020    Lab Results  Component Value Date/Time   HGBA1C 5.6 09/07/2020 01:16 PM   HGBA1C 6.2 08/13/2019 07:50 AM   GFR 91.41 09/07/2020 01:16 PM   GFR 73.03 08/13/2019 07:50 AM   MICROALBUR 12.9 (H) 04/28/2014 10:35 AM   MICROALBUR 1.1 04/22/2013 10:04 AM    Last diabetic Eye exam: No results found for: HMDIABEYEEXA  Last diabetic Foot exam: No results found for: HMDIABFOOTEX   Lab Results  Component Value Date   CHOL 109 09/07/2020   HDL 40.00 09/07/2020   LDLCALC 46 09/07/2020   LDLDIRECT 128.0 05/07/2015   TRIG 118.0 09/07/2020   CHOLHDL 3 09/07/2020    Hepatic Function Latest Ref Rng & Units 10/29/2020 10/27/2020 10/26/2020  Total Protein 6.5 - 8.1 g/dL 5.8(L)  5.4(L) 5.3(L)  Albumin 3.5 - 5.0 g/dL 2.6(L) 2.6(L) 2.6(L)  AST 15 - 41 U/L 137(H) 137(H) 133(H)  ALT 0 - 44 U/L 163(H) 157(H) 167(H)  Alk Phosphatase 38 - 126 U/L 170(H) 189(H) 143(H)  Total Bilirubin 0.3 - 1.2 mg/dL 2.6(H) 5.3(H) 2.8(H)    Lab Results   Component Value Date/Time   TSH 2.05 08/09/2018 08:22 AM   TSH 3.99 02/13/2017 10:17 AM    CBC Latest Ref Rng & Units 10/29/2020 10/28/2020 10/26/2020  WBC 4.0 - 10.5 K/uL 14.1(H) 15.7(H) 9.2  Hemoglobin 13.0 - 17.0 g/dL 14.8 14.1 14.1  Hematocrit 39.0 - 52.0 % 43.7 40.4 38.7(L)  Platelets 150 - 400 K/uL 151 163 135(L)    No results found for: VD25OH  Clinical ASCVD: No  The ASCVD Risk score (Arnett DK, et al., 2019) failed to calculate for the following reasons:   The valid total cholesterol range is 130 to 320 mg/dL    Depression screen [facility] 2/9 09/08/2020 08/27/2020 03/11/2020  Decreased Interest 0 0 0  Down, Depressed, Hopeless 0 0 0  PHQ - 2 Score 0 0 0  Altered sleeping - 0 -  Tired, decreased energy - 0 -  Change in appetite - 0 -  Feeling bad or failure about yourself  - 0 -  Trouble concentrating - 0 -  Moving slowly or fidgety/restless - 0 -  Suicidal thoughts - 0 -  PHQ-9 Score - 0 -  Difficult doing work/chores - Not difficult at all -  Some recent data might be hidden    GAD 7 : Generalized Anxiety Score 02/11/2018  Nervous, Anxious, on Edge 1  Control/stop worrying 0  Worry too much - different things 1  Trouble relaxing 1  Restless 0  Easily annoyed or irritable 0  Afraid - awful might happen 0  Total GAD 7 Score 3      Social History   Tobacco Use  Smoking Status Never  Smokeless Tobacco Never   BP Readings from Last 3 Encounters:  06/22/21 (!) 146/81  03/23/21 (!) 144/84  12/08/20 138/79   Pulse Readings from Last 3 Encounters:  06/22/21 84  03/23/21 62  12/08/20 64   Wt Readings from Last 3 Encounters:  06/22/21 225 lb (102.1 kg)  03/23/21 215 lb (97.5 kg)  12/08/20 215 lb (97.5 kg)   BMI Readings from Last 3 Encounters:  06/22/21 35.24 kg/m  03/23/21 33.67 kg/m  12/08/20 33.67 kg/m    Assessment/Interventions: Review of patient past medical history, allergies, medications, health status, including review of consultants reports,  laboratory and other test data, was performed as part of comprehensive evaluation and provision of chronic care management services.   SDOH:  (Social Determinants of Health) assessments and interventions performed: Yes  SDOH Screenings   Alcohol Screen: Low Risk    Last Alcohol Screening Score (AUDIT): 0  Depression (PHQ2-9): Low Risk    PHQ-2 Score: 0  Financial Resource Strain: Low Risk    Difficulty of Paying Living Expenses: Not hard at all  Food Insecurity: No Food Insecurity   Worried About Charity fundraiser in the Last Year: Never true   Ran Out of Food in the Last Year: Never true  Housing: Low Risk    Last Housing Risk Score: 0  Physical Activity: Sufficiently Active   Days of Exercise per Week: 7 days   Minutes of Exercise per Session: 30 min  Social Connections: Not on file  Stress: No Stress Concern Present  Feeling of Stress : Not at all  Tobacco Use: Low Risk    Smoking Tobacco Use: Never   Smokeless Tobacco Use: Never   Passive Exposure: Not on file  Transportation Needs: No Transportation Needs   Lack of Transportation (Medical): No   Lack of Transportation (Non-Medical): No    CCM Care Plan  Allergies  Allergen Reactions   Ciprofloxacin Other (See Comments)    PANIC ATTACKS (Fluoroquinolones)    Metformin Other (See Comments)    Bad acid reflux   Buprenorphine Hcl Nausea Only   Morphine And Related Nausea Only   Penicillins Rash    Rash Has patient had a PCN reaction causing immediate rash, facial/tongue/throat swelling, SOB or lightheadedness with hypotension: Unknown Has patient had a PCN reaction causing severe rash involving mucus membranes or skin necrosis: Unknown Has patient had a PCN reaction that required hospitalization: No Has patient had a PCN reaction occurring within the last 10 years: No CHILDHOOD REACTION. If all of the above answers are "NO", then may proceed with Cephalosporin use.     Medications Reviewed Today      Reviewed by Milinda Pointer, MD (Physician) on 06/22/21 at [phone]  Med List Status: <None>   Medication Order Taking? Sig Documenting Provider Last Dose Status Informant  Ascorbic Acid (VITAMIN C) 1000 MG tablet [id number] Yes Take 1,000 mg by mouth daily. [provider] Taking Active Self  aspirin 81 MG tablet [id number] Yes Take 81 mg by mouth daily. [provider] Taking Active Self  atorvastatin (LIPITOR) 20 MG tablet [id number] Yes Take 1 tablet by mouth at bedtime. [provider] Taking Active   cetirizine (ZYRTEC) 10 MG tablet [id number] Yes Take 10 mg by mouth at bedtime.  [provider] Taking Active Self  Cholecalciferol (VITAMIN D) 2000 units CAPS [id number] Yes Take 2,000 Units by mouth daily.  [provider] Taking Active Self  clindamycin (CLEOCIN) 150 MG capsule [id number] Yes Take 150 mg by mouth as needed. Prior to dental appt [provider] Taking Active   CRANBERRY PO [id number] Yes Take 4,200 mg by mouth at bedtime.  [provider] Taking Active Self           Med Note Maylene Roes Jun 21, 2016 10:22 AM)    cyclobenzaprine (FLEXERIL) 10 MG tablet [id number] Yes TAKE ONE-HALF TO ONE TABLET BY MOUTH 2 TIMES DAILY AS NEEDED FOR MUSCLE SPASMS Ria Bush, MD Taking Active   dicyclomine (BENTYL) 20 MG tablet [id number] Yes Take 20 mg by mouth 2 (two) times daily as needed for spasms. [provider] Taking Active Self  diphenhydrAMINE (BENADRYL) 50 MG capsule [id number] Yes Take 50 mg by mouth at bedtime. [provider] Taking Active Self  ferrous sulfate 325 (65 FE) MG EC tablet [id number] Yes Take 325 mg by mouth daily with breakfast. [provider] Taking Active Self  FLUoxetine (PROZAC) 20 MG capsule [id number] Yes Take 1 capsule (20 mg total) by mouth daily.  Patient taking differently: Take 20 mg by mouth at bedtime.   Ria Bush, MD Taking Active   FLUoxetine  [facility]) 40 MG capsule [id number] Yes Take 1 capsule (40 mg total) by mouth daily.  Patient taking differently: Take 40 mg by mouth in the morning.   Ria Bush, MD Taking Active   fluticasone [facility]) 50 MCG/ACT nasal spray [id number] Yes Place 2 sprays into both nostrils daily. Ria Bush, MD Taking Active Self  furosemide (LASIX) 20 MG tablet [id number]  Yes Take 1 tablet (20 mg total) by mouth daily. Ria Bush, MD Taking Active Self  Garlic 8657 MG CAPS [id number] Yes Take 1,000 mg by mouth daily. [provider] Taking Active Self  Multiple Vitamins-Minerals (CERTAVITE SENIOR/ANTIOXIDANT) TABS [id number] Yes Take 1 tablet by mouth daily. [provider] Taking Active Self  naloxone [facility]) 4 MG/0.1ML LIQD nasal spray kit [id number] Yes Spray into one nostril. Repeat with second device into other nostril after 2-3 minutes if no or minimal response. Milinda Pointer, MD Taking Active Self  naproxen (NAPROSYN) 500 MG tablet [id number] Yes TAKE 1 TABLET BY MOUTH 2 TIMES DAILY AS NEEDED FOR MODERATE PAIN Ria Bush, MD Taking Active   Omega-3 1000 MG CAPS [id number] Yes Take 1,000 mg by mouth at bedtime. [provider] Taking Active Self  omeprazole (PRILOSEC) 20 MG capsule [id number] Yes Take 1 capsule (20 mg total) by mouth daily as needed (GERD). Ria Bush, MD Taking Active Self  Oxycodone HCl 10 MG TABS [id number] Yes Take 1 tablet (10 mg total) by mouth every 6 (six) hours as needed. Milinda Pointer, MD Taking Active   Oxycodone HCl 10 MG TABS [id number] Yes Take 1 tablet (10 mg total) by mouth every 6 (six) hours as needed. Milinda Pointer, MD Taking Active   Oxycodone HCl 10 MG TABS [id number] Yes Take 1 tablet (10 mg total) by mouth every 6 (six) hours as needed. Milinda Pointer, MD Taking Active            Med Note Dossie Arbour, [place] A   Wed Jun 22, 2021  9:25 AM) WARNING: Not a Duplicate. Future prescription. DO NOT DELETE  during hospital medication reconciliation or at discharge. [facility] Chronic Pain Management Patient   potassium citrate (UROCIT-K) 10 MEQ (1080 MG) SR tablet [id number] Yes Take 1 tablet (10 mEq total) by mouth 2 (two) times daily. Abbie Sons, MD Taking Active   rOPINIRole (REQUIP) 1 MG tablet [id number] Yes Take 1 tablet (1 mg total) by mouth at bedtime. Ria Bush, MD Taking Active Self  Saw Palmetto, Serenoa repens, (SAW PALMETTO PO) [id number] Yes Take 450 mg by mouth 2 (two) times daily.  [provider] Taking Active Self  VITAMIN B COMPLEX-C CAPS [id number] Yes Take 1 capsule by mouth daily. [provider] Taking Active Self           Med Note Maylene Roes Jun 21, 2016 10:25 AM)    vitamin E 1000 UNIT capsule [id number] Yes Take 1,000 Units by mouth daily.  [provider] Taking Active Self  Med List Note Rise Patience, RN 03/23/21 2993): MR 06-22-2021 UDS 12-08-2020             Patient Active Problem List   Diagnosis Date Noted   DDD (degenerative disc disease), cervical 03/16/2021   Chronic low back pain (Bilateral) w/o sciatica 03/16/2021   Chronic shoulder pain (Bilateral) (L>R) 03/16/2021   History of laparoscopic cholecystectomy (10/27/2020) 12/08/2020   Pacemaker 10/22/2020   Chronic diastolic heart failure ([place]) 10/22/2020   Prediabetes 10/22/2020   Serum lipase elevation 10/22/2020   Transaminasemia 71/69/6789   Uncomplicated opioid dependence ([place]) 09/08/2020   Trigger point with back pain (Left) 03/30/2020   Trigger point of shoulder region (scapula) (Left) 03/30/2020   Osteoarthritis of first carpometacarpal joint (thumb) of hand (Left) 03/11/2020   Chronic thumb pain (Left) 03/03/2020   Trigger middle finger of right hand 03/03/2020   Chronic hand pain (Left) 03/03/2020   Pharmacologic therapy  12/02/2019   Disorder of skeletal system 12/02/2019   Problems influencing health status 12/02/2019   Cervicalgia  02/12/2019   Trigger finger of thumb (Right) 02/12/2019   GERD (gastroesophageal reflux disease) 09/24/2018   IDA (iron deficiency anemia) 09/24/2018   Erectile dysfunction due to arterial insufficiency 09/12/2018   Trigger finger of index finger (Right) 08/16/2018   RLS (restless legs syndrome) 02/11/2018   Immunization deficiency 02/11/2018   Positive colorectal cancer screening using Cologuard test 09/21/2017   Low testosterone in male 02/18/2017   Chronic fatigue 02/08/2017   Advanced care planning/counseling discussion 08/11/2016   Chronic cervical radicular pain 06/21/2016   Long term current use of opiate analgesic 05/15/2016   OSA (obstructive sleep apnea) 04/11/2016   Hypocitraturia 03/16/2016   Cervical foraminal stenosis (Severe C6-7) (Left) 02/02/2016   Chronic sacroiliac joint pain (Bilateral) 02/02/2016   Osteoarthritis of sacroiliac joint (Bilateral) 02/02/2016   Lumbar facet hypertrophy (L1-2, L2-3, and L4-5) (Bilateral) 02/02/2016   Lumbar IVDD (intervertebral disc displacement) 02/02/2016   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) 02/02/2016   Lumbar facet syndrome (Bilateral) 02/02/2016   Osteoarthritis of shoulder (Left) 02/02/2016   Osteoarthritis of knee (Bilateral) (R>L) 02/02/2016   Radicular pain of shoulder (Bilateral) (L>R) 09/01/2015   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) 09/01/2015    Class: History of   Cervicogenic headache 09/01/2015   Cervical spondylosis with radiculopathy (Bilateral) (L>R) 09/01/2015   Cervical facet syndrome (Bilateral) (L>R) 09/01/2015   Opiate use (60 MME/Day) 05/31/2015   Encounter for therapeutic drug level monitoring 05/31/2015   Chronic neck pain (1ry area of Pain) (Bilateral) (L>R) 05/31/2015   Chronic knee pain (Bilateral) (R>L) 05/31/2015   History of permanent cardiac pacemaker placement 05/31/2015   Opioid-induced constipation (OIC) 05/31/2015   Health maintenance examination 05/07/2015    Candidal dermatitis 05/07/2015   Bradycardia 02/16/2014   Cardiac murmur 02/16/2014   Severe obesity (BMI 35.0-39.9) with comorbidity ([place]) 04/22/2013   Benign prostatic hyperplasia with urinary obstruction 05/28/2012   Nephrolithiasis 05/28/2012   Medicare annual wellness visit, subsequent 03/27/2012   HLD (hyperlipidemia) 03/27/2012   GAD (generalized anxiety disorder) 12/25/2011   Insomnia 09/27/2011   Chronic pain syndrome 03/28/2011   MDD (major depressive disorder), recurrent episode, moderate ([place]) 03/28/2011    Immunization History  Administered Date(s) Administered   Influenza, High Dose Seasonal PF 05/04/2021   Influenza,inj,Quad PF,6+ Mos 04/22/2013, 04/28/2014, 05/07/2015, 04/11/2016, 05/22/2017, 05/16/2018   Influenza-Unspecified 06/16/2019, 04/15/2020   Moderna Covid-19 Vaccine Bivalent Booster 24yr & up 05/05/2021   Moderna Sars-Covid-2 Vaccination 11/06/2019, 12/04/2019, 07/07/2020   Pneumococcal Polysaccharide-23 05/30/2006   Tdap 11/19/2009, 04/15/2020    Conditions to be addressed/monitored:  Hypertension, Hyperlipidemia, Heart Failure, Depression, and Allergic Rhinitis, RLS  Care Plan : [place] Updates made by FCharlton Haws RSeven Fieldssince 08/15/2021 12:00 AM     Problem: Hypertension, Hyperlipidemia, Heart Failure, Depression, and Allergic Rhinitis, RLS   Priority: High     Long-Range Goal: Disease mgmt   Start Date: 07/15/2021  Expected End Date: 07/15/2022  This Visit's Progress: On track  Recent Progress: On track  Priority: High  Note:   Current Barriers:  Unable to independently monitor therapeutic efficacy  Pharmacist Clinical Goal(s):  Patient will achieve adherence to monitoring guidelines and medication adherence to achieve therapeutic efficacy through collaboration with PharmD and provider.   Interventions: 1:1 collaboration with GRia Bush MD regarding development and update of comprehensive plan of care as  evidenced by provider attestation and  co-signature Inter-disciplinary care team collaboration (see longitudinal plan of care) Comprehensive medication review performed; medication list updated in electronic medical record  Hyperlipidemia: (LDL goal < 100) -Controlled - baseline LDL 130, currently 46; pt endorses compliance with statin -Current treatment: Atorvastatin 20 mg daily HS Omega-3 1000 mg Aspirin 81 mg daily -Educated on Cholesterol goals; Benefits of statin for ASCVD risk reduction; -Recommended to continue current medication  Heart Failure (BP Goal < 130/80, manage symptoms and prevent exacerbations) -Not ideally controlled - BP readings are above goal at home -Hx heart block s/p dual chamber pacemaker -Last ejection fraction: 55-65% (Date: 10/2017) -HF type: Diastolic -Home BP readings: checking AM and HS 08/15/21: 135/83  08/14/21: 147/80, 150/75 08/13/21: 141/87, 142/75 08/12/21: 138/85, 147/84 08/11/21: 124/80, 142/82 08/10/21: 126/73, 159/84 08/09/21: 129/77, 151/87 08/08/21: 157/77, 124/68 08/07/21: 140/82, 160/84 08/06/21: 136/85, 139/76 08/05/21: 133/83, 147/84 -Current treatment: Furosemide 20 mg daily -Educated on BP goals and benefits of medications for prevention of heart attack, stroke and kidney damage; Daily salt intake goal < 2300 mg; Importance of home blood pressure monitoring; Proper BP monitoring technique; -Counseled to monitor BP at home daily -Recommended starting BP medication however pt is overdue for labs/PCP visit (last OV 09/14/20, last labwork 10/29/20 following cholesystectomy) - caution ACE/ARB with chronic naproxen use and risk for AKI; may opt for thiazide diuretic or amlodipine first.  -Advised PCP f/u to address the above - pt states he will call next week. Also forwarding message to PCP/assistant to make sure appt is scheduled.  Depression/Anxiety (Goal: manage symptoms) -Controlled - pt has been on fluoxetine since at least 2013; he reports is is  working "well enough" currently -Current treatment: Fluoxetine 20 mg daily HS Fluoxetine 40 mg daily AM -PHQ9: 0 (09/2020) -GAD7: 3 (02/2018) -Connected with PCP for mental health support -Educated on Benefits of medication for symptom control -Recommended to continue current medication  Chronic pain (Goal: manage pain) -Not ideally controlled - per pt report. He stopped naproxen last month due to BP risks and reports pain is significantly worse -cervical radiculopathy; lumbar disc displacement, OA of knee, shoulder -Follows with Dr Dossie Arbour (pain mgmt) -Current treatment  Oxycodone 10 m q6h (#120/month): 2-3 times per day Naproxen 500 mg BID prn - takes once a day Cyclobenzaprine 10 mg BID prn Narcan PRN -Educated on risks of chronic NSAID use including elevated BP and kidney damage; discussed naproxen could be contributing to high BP at home; would avoid Tylenol due to recently elevated LFTs (although after cholesystectomy these should normalize - need repeat labs to be sure) -Advised he could restart naproxen once daily - benefits outweigh risks at this point  Renal stones (Goal: prevent recurrence) -Controlled  - pt follows with urology; he endorses compliance with potassium citrate -Current treatment  Potassium citrate 10 mEq BID (renal stones) -Discussed role of potassium citrate in controlling/preventing renal stones -Recommended to continue current medication  Health Maintenance -Vaccine gaps: Flu, Covid booster, Prevnar, Shingrix -Updated flu and covid vaccines in record; advised pt get Prevnar vaccine at next office visit  Patient Goals/Self-Care Activities Patient will:  - take medications as prescribed as evidenced by patient report and record review focus on medication adherence by pill box check blood pressure daily, document, and provide at future appointments -Schedule PCP appt ASAP to address BP      Medication Assistance: None required.  Patient affirms  current coverage meets needs.  Compliance/Adherence/Medication fill history: Care Gaps: Cologuard (due 09/21/20) - planning to set up colonoscopy in 2023  AWV due  Star-Rating Drugs: Atorvastatin 20 mg - LF 05/16/21 x 90 ds; [place] 100%  Patient's preferred pharmacy is:  [facility], [place] [address] [place] [address] Phone: [phone] Fax: [phone]  [facility] [place], [place] [address] Phone: [phone] Fax: [phone]   Uses pill box? Yes Pt endorses 100% compliance  We discussed: Current pharmacy is preferred with insurance plan and patient is satisfied with pharmacy services Patient decided to: Continue current medication management strategy  Care Plan and Follow Up Patient Decision:  Patient agrees to Care Plan and Follow-up.  Plan: Telephone follow up appointment with care management team member scheduled for:  1 month  Charlene Brooke, PharmD, [facility] Clinical Pharmacist [facility] [phone]

## 2021-08-15 NOTE — Patient Instructions (Signed)
Visit Information  Phone number for Pharmacist: [phone]   Goals Addressed             This Visit's Progress    Track and Manage My Blood Pressure-Hypertension       Timeframe:  Short-Term Goal Priority:  High Start Date:       07/15/21                      Expected End Date:    08/15/21                   Follow Up Date Feb 2023   - check blood pressure daily - choose a place to take my blood pressure (home, clinic or office, retail store) - write blood pressure results in a log or diary    Why is this important?   You won't feel high blood pressure, but it can still hurt your blood vessels.  High blood pressure can cause heart or kidney problems. It can also cause a stroke.  Making lifestyle changes like losing a little weight or eating less salt will help.  Checking your blood pressure at home and at different times of the day can help to control blood pressure.  If the doctor prescribes medicine remember to take it the way the doctor ordered.  Call the office if you cannot afford the medicine or if there are questions about it.     Notes:         Care Plan : CCM Pharmacy Care Plan  Updates made by Kathyrn Sheriff, RPH since 08/15/2021 12:00 AM     Problem: Hypertension, Hyperlipidemia, Heart Failure, Depression, and Allergic Rhinitis, RLS   Priority: High     Long-Range Goal: Disease mgmt   Start Date: 07/15/2021  Expected End Date: 07/15/2022  This Visit's Progress: On track  Recent Progress: On track  Priority: High  Note:   Current Barriers:  Unable to independently monitor therapeutic efficacy  Pharmacist Clinical Goal(s):  Patient will achieve adherence to monitoring guidelines and medication adherence to achieve therapeutic efficacy through collaboration with PharmD and provider.   Interventions: 1:1 collaboration with [name], MD regarding development and update of comprehensive plan of care as evidenced by provider attestation and  co-signature Inter-disciplinary care team collaboration (see longitudinal plan of care) Comprehensive medication review performed; medication list updated in electronic medical record  Hyperlipidemia: (LDL goal < 100) -Controlled - baseline LDL 130, currently 46; pt endorses compliance with statin -Current treatment: Atorvastatin 20 mg daily HS Omega-3 1000 mg Aspirin 81 mg daily -Educated on Cholesterol goals; Benefits of statin for ASCVD risk reduction; -Recommended to continue current medication  Heart Failure (BP Goal < 130/80, manage symptoms and prevent exacerbations) -Not ideally controlled - BP readings are above goal at home -Hx heart block s/p dual chamber pacemaker -Last ejection fraction: 55-65% (Date: 10/2017) -HF type: Diastolic -Home BP readings: checking AM and HS 08/15/21: 135/83  08/14/21: 147/80, 150/75 08/13/21: 141/87, 142/75 08/12/21: 138/85, 147/84 08/11/21: 124/80, 142/82 08/10/21: 126/73, 159/84 08/09/21: 129/77, 151/87 08/08/21: 157/77, 124/68 08/07/21: 140/82, 160/84 08/06/21: 136/85, 139/76 08/05/21: 133/83, 147/84 -Current treatment: Furosemide 20 mg daily -Educated on BP goals and benefits of medications for prevention of heart attack, stroke and kidney damage; Daily salt intake goal < 2300 mg; Importance of home blood pressure monitoring; Proper BP monitoring technique; -Counseled to monitor BP at home daily -Recommended starting BP medication however pt is overdue for labs/PCP visit (last OV  09/14/20, last labwork 10/29/20 following cholesystectomy) - caution ACE/ARB with chronic naproxen use and risk for AKI; may opt for thiazide diuretic or amlodipine first.  -Advised PCP f/u to address the above - pt states he will call next week. Also forwarding message to PCP/assistant to make sure appt is scheduled.  Depression/Anxiety (Goal: manage symptoms) -Controlled - pt has been on fluoxetine since at least 2013; he reports is is working "well enough" currently -Current  treatment: Fluoxetine 20 mg daily HS Fluoxetine 40 mg daily AM -PHQ9: 0 (09/2020) -GAD7: 3 (02/2018) -Connected with PCP for mental health support -Educated on Benefits of medication for symptom control -Recommended to continue current medication  Chronic pain (Goal: manage pain) -Not ideally controlled - per pt report. He stopped naproxen last month due to BP risks and reports pain is significantly worse -cervical radiculopathy; lumbar disc displacement, OA of knee, shoulder -Follows with Dr Laban Emperor (pain mgmt) -Current treatment  Oxycodone 10 m q6h (#120/month): 2-3 times per day Naproxen 500 mg BID prn - takes once a day Cyclobenzaprine 10 mg BID prn Narcan PRN -Educated on risks of chronic NSAID use including elevated BP and kidney damage; discussed naproxen could be contributing to high BP at home; would avoid Tylenol due to recently elevated LFTs (although after cholesystectomy these should normalize - need repeat labs to be sure) -Advised he could restart naproxen once daily - benefits outweigh risks at this point  Renal stones (Goal: prevent recurrence) -Controlled  - pt follows with urology; he endorses compliance with potassium citrate -Current treatment  Potassium citrate 10 mEq BID (renal stones) -Discussed role of potassium citrate in controlling/preventing renal stones -Recommended to continue current medication  Health Maintenance -Vaccine gaps: Flu, Covid booster, Prevnar, Shingrix -Updated flu and covid vaccines in record; advised pt get Prevnar vaccine at next office visit  Patient Goals/Self-Care Activities Patient will:  - take medications as prescribed as evidenced by patient report and record review focus on medication adherence by pill box check blood pressure daily, document, and provide at future appointments -Schedule PCP appt ASAP to address BP      Patient verbalizes understanding of instructions provided today and agrees to view in MyChart.   Telephone follow up appointment with pharmacy team member scheduled for: 1 month  [name], PharmD, [facility] Clinical Pharmacist [facility] [phone]

## 2021-08-19 NOTE — Telephone Encounter (Signed)
LMTCB to schedule.

## 2021-08-22 NOTE — Telephone Encounter (Signed)
2nd attempt  LMTCB to schedule  

## 2021-08-31 ENCOUNTER — Telehealth: Payer: Self-pay | Admitting: Family Medicine

## 2021-08-31 NOTE — Telephone Encounter (Signed)
Opened in error

## 2021-09-06 DIAGNOSIS — F331 Major depressive disorder, recurrent, moderate: Secondary | ICD-10-CM | POA: Diagnosis not present

## 2021-09-06 DIAGNOSIS — E785 Hyperlipidemia, unspecified: Secondary | ICD-10-CM

## 2021-09-07 ENCOUNTER — Telehealth: Payer: Medicare Other

## 2021-09-07 NOTE — Progress Notes (Deleted)
Chronic Care Management Pharmacy Note  09/07/2021 Name:  Mark [name] MRN:  [id number] DOB:  1956/04/02  Summary: -Home BP range 126/73-160/84 (Avg 141/80). BP goal with diastolic HF is < 638/75. He is only taking furosemide. He will resume chronic daily naproxen, would avoid ACE/ARB due to AKI risk. Pt is amenable to starting BP medication but is due for PCP visit (last OV 09/14/20, last labwork 10/29/20) -Pt has been off naproxen for ~4 weeks due to BP risks and reports joint pain is significantly worse. Would avoid Tylenol until we can can confirm LFTs have normalized after cholecystectomy last year  Recommendations/Changes made from today's visit: -Recommend CCB or thiazide  - however pt needs PCP visit/labwork prior to starting BP medication. Coordinating with office staff to schedule OV - see telephone note.  Plan: -Pharmacist follow up televisit scheduled for 1 month (BP check)   Subjective: [name] is an 66 y.o. year old male who is a primary patient of Mark Bush, MD.  The CCM team was consulted for assistance with disease management and care coordination needs.    Engaged with patient by telephone for follow up visit in response to provider referral for pharmacy case management and/or care coordination services.   Consent to Services:  The patient was given information about Chronic Care Management services, agreed to services, and gave verbal consent prior to initiation of services.  Please see initial visit note for detailed documentation.   Patient Care Team: Mark Bush, MD as PCP - General (Family Medicine) Hyman Hopes, [place], [place] as Referring Physician (Dentistry) Milinda Pointer, MD as Referring Physician (Pain Medicine) Teodoro Spray, MD as Consulting Physician (Cardiology) Laverle Hobby, MD as Consulting Physician (Pulmonary Disease) Abbie Sons, MD (Urology) Leonie Green, OD as Referring Physician (Optometry) Charlton Haws, [facility] as Pharmacist (Pharmacist)  Patient lives alone, his mom lives next door and cooks for him occasionally. She had a stroke last year and he helps with her medications as well.  Recent office visits: 09/14/20 Dr Danise Mina OV: annual exam; referred to GI for Cologard  Recent consult visits: 06/22/21-Pain Management- Francisco Naveira,MD-Patient presented for left shoulder pain,pain medication management. 05/25/21-Cardiology-Kenneth Fath,MD-Patient presented for pacemaker evaluation. Normal function, no medication changes  03/23/21-Pain Management-Francisco Naveira,MD-Patient presented for follow up pain medication management.  Hospital visits: None in previous 6 months   Objective:  Lab Results  Component Value Date   CREATININE 0.89 10/29/2020   BUN 13 10/29/2020   GFR 91.41 09/07/2020   GFRNONAA >60 10/29/2020   GFRAA >60 12/06/2017   NA 137 10/29/2020   K 3.7 10/29/2020   CALCIUM 8.6 (L) 10/29/2020   CO2 22 10/29/2020   GLUCOSE 140 (H) 10/29/2020    Lab Results  Component Value Date/Time   HGBA1C 5.6 09/07/2020 01:16 PM   HGBA1C 6.2 08/13/2019 07:50 AM   GFR 91.41 09/07/2020 01:16 PM   GFR 73.03 08/13/2019 07:50 AM   MICROALBUR 12.9 (H) 04/28/2014 10:35 AM   MICROALBUR 1.1 04/22/2013 10:04 AM    Last diabetic Eye exam: No results found for: HMDIABEYEEXA  Last diabetic Foot exam: No results found for: HMDIABFOOTEX   Lab Results  Component Value Date   CHOL 109 09/07/2020   HDL 40.00 09/07/2020   LDLCALC 46 09/07/2020   LDLDIRECT 128.0 05/07/2015   TRIG 118.0 09/07/2020   CHOLHDL 3 09/07/2020    Hepatic Function Latest Ref Rng & Units 10/29/2020 10/27/2020 10/26/2020  Total Protein 6.5 - 8.1 g/dL 5.8(L)  5.4(L) 5.3(L)  Albumin 3.5 - 5.0 g/dL 2.6(L) 2.6(L) 2.6(L)  AST 15 - 41 U/L 137(H) 137(H) 133(H)  ALT 0 - 44 U/L 163(H) 157(H) 167(H)  Alk Phosphatase 38 - 126 U/L 170(H) 189(H) 143(H)  Total Bilirubin 0.3 - 1.2 mg/dL 2.6(H) 5.3(H) 2.8(H)    Lab Results   Component Value Date/Time   TSH 2.05 08/09/2018 08:22 AM   TSH 3.99 02/13/2017 10:17 AM    CBC Latest Ref Rng & Units 10/29/2020 10/28/2020 10/26/2020  WBC 4.0 - 10.5 K/uL 14.1(H) 15.7(H) 9.2  Hemoglobin 13.0 - 17.0 g/dL 14.8 14.1 14.1  Hematocrit 39.0 - 52.0 % 43.7 40.4 38.7(L)  Platelets 150 - 400 K/uL 151 163 135(L)    No results found for: VD25OH  Clinical ASCVD: No  The ASCVD Risk score (Arnett DK, et al., 2019) failed to calculate for the following reasons:   The valid total cholesterol range is 130 to 320 mg/dL    Depression screen [facility] 2/9 09/08/2020 08/27/2020 03/11/2020  Decreased Interest 0 0 0  Down, Depressed, Hopeless 0 0 0  PHQ - 2 Score 0 0 0  Altered sleeping - 0 -  Tired, decreased energy - 0 -  Change in appetite - 0 -  Feeling bad or failure about yourself  - 0 -  Trouble concentrating - 0 -  Moving slowly or fidgety/restless - 0 -  Suicidal thoughts - 0 -  PHQ-9 Score - 0 -  Difficult doing work/chores - Not difficult at all -  Some recent data might be hidden    GAD 7 : Generalized Anxiety Score 02/11/2018  Nervous, Anxious, on Edge 1  Control/stop worrying 0  Worry too much - different things 1  Trouble relaxing 1  Restless 0  Easily annoyed or irritable 0  Afraid - awful might happen 0  Total GAD 7 Score 3      Social History   Tobacco Use  Smoking Status Never  Smokeless Tobacco Never   BP Readings from Last 3 Encounters:  06/22/21 (!) 146/81  03/23/21 (!) 144/84  12/08/20 138/79   Pulse Readings from Last 3 Encounters:  06/22/21 84  03/23/21 62  12/08/20 64   Wt Readings from Last 3 Encounters:  06/22/21 225 lb (102.1 kg)  03/23/21 215 lb (97.5 kg)  12/08/20 215 lb (97.5 kg)   BMI Readings from Last 3 Encounters:  06/22/21 35.24 kg/m  03/23/21 33.67 kg/m  12/08/20 33.67 kg/m    Assessment/Interventions: Review of patient past medical history, allergies, medications, health status, including review of consultants reports,  laboratory and other test data, was performed as part of comprehensive evaluation and provision of chronic care management services.   SDOH:  (Social Determinants of Health) assessments and interventions performed: Yes  SDOH Screenings   Alcohol Screen: Not on file  Depression (PHQ2-9): Low Risk    PHQ-2 Score: 0  Financial Resource Strain: Not on file  Food Insecurity: Not on file  Housing: Not on file  Physical Activity: Not on file  Social Connections: Not on file  Stress: Not on file  Tobacco Use: Low Risk    Smoking Tobacco Use: Never   Smokeless Tobacco Use: Never   Passive Exposure: Not on file  Transportation Needs: Not on file    CCM Care Plan  Allergies  Allergen Reactions   Ciprofloxacin Other (See Comments)    PANIC ATTACKS (Fluoroquinolones)    Metformin Other (See Comments)    Bad acid reflux   Buprenorphine  Hcl Nausea Only   Morphine And Related Nausea Only   Penicillins Rash    Rash Has patient had a PCN reaction causing immediate rash, facial/tongue/throat swelling, SOB or lightheadedness with hypotension: Unknown Has patient had a PCN reaction causing severe rash involving mucus membranes or skin necrosis: Unknown Has patient had a PCN reaction that required hospitalization: No Has patient had a PCN reaction occurring within the last 10 years: No CHILDHOOD REACTION. If all of the above answers are "NO", then may proceed with Cephalosporin use.     Medications Reviewed Today     Reviewed by Milinda Pointer, MD (Physician) on 06/22/21 at [id number]  Med List Status: <None>   Medication Order Taking? Sig Documenting Provider Last Dose Status Informant  Ascorbic Acid (VITAMIN C) 1000 MG tablet [id number] Yes Take 1,000 mg by mouth daily. [provider] Taking Active Self  aspirin 81 MG tablet [id number] Yes Take 81 mg by mouth daily. [provider] Taking Active Self  atorvastatin (LIPITOR) 20 MG tablet [id number] Yes Take 1 tablet by mouth  at bedtime. [provider] Taking Active   cetirizine (ZYRTEC) 10 MG tablet [id number] Yes Take 10 mg by mouth at bedtime.  [provider] Taking Active Self  Cholecalciferol (VITAMIN D) 2000 units CAPS [id number] Yes Take 2,000 Units by mouth daily.  [provider] Taking Active Self  clindamycin (CLEOCIN) 150 MG capsule [id number] Yes Take 150 mg by mouth as needed. Prior to dental appt [provider] Taking Active   CRANBERRY PO [id number] Yes Take 4,200 mg by mouth at bedtime.  [provider] Taking Active Self           Med Note Maylene Roes Jun 21, 2016 10:22 AM)    cyclobenzaprine (FLEXERIL) 10 MG tablet [id number] Yes TAKE ONE-HALF TO ONE TABLET BY MOUTH 2 TIMES DAILY AS NEEDED FOR MUSCLE SPASMS Mark Bush, MD Taking Active   dicyclomine (BENTYL) 20 MG tablet [id number] Yes Take 20 mg by mouth 2 (two) times daily as needed for spasms. [provider] Taking Active Self  diphenhydrAMINE (BENADRYL) 50 MG capsule [id number] Yes Take 50 mg by mouth at bedtime. [provider] Taking Active Self  ferrous sulfate 325 (65 FE) MG EC tablet [id number] Yes Take 325 mg by mouth daily with breakfast. [provider] Taking Active Self  FLUoxetine (PROZAC) 20 MG capsule [id number] Yes Take 1 capsule (20 mg total) by mouth daily.  Patient taking differently: Take 20 mg by mouth at bedtime.   Mark Bush, MD Taking Active   FLUoxetine [facility]) 40 MG capsule [id number] Yes Take 1 capsule (40 mg total) by mouth daily.  Patient taking differently: Take 40 mg by mouth in the morning.   Mark Bush, MD Taking Active   fluticasone [facility]) 50 MCG/ACT nasal spray [id number] Yes Place 2 sprays into both nostrils daily. Mark Bush, MD Taking Active Self  furosemide (LASIX) 20 MG tablet [id number] Yes Take 1 tablet (20 mg total) by mouth daily. Mark Bush, MD Taking Active Self  Garlic 6333 MG CAPS  [id number] Yes Take 1,000 mg by mouth daily. [provider] Taking Active Self  Multiple Vitamins-Minerals (CERTAVITE SENIOR/ANTIOXIDANT) TABS [id number] Yes Take 1 tablet by mouth daily. [provider] Taking Active Self  naloxone [facility]) 4 MG/0.1ML LIQD nasal spray kit [id number] Yes Spray into one nostril. Repeat with second device into other nostril after 2-3 minutes if no or minimal response. Milinda Pointer, MD Taking Active Self  naproxen (NAPROSYN) 500 MG tablet [id number] Yes TAKE 1 TABLET BY MOUTH 2 TIMES DAILY AS NEEDED FOR MODERATE PAIN Mark Bush, MD Taking Active   Omega-3 1000 MG CAPS [id number] Yes Take 1,000 mg by mouth at bedtime. [provider] Taking Active Self  omeprazole (PRILOSEC) 20 MG capsule [id number] Yes Take 1 capsule (20 mg total) by mouth daily as needed (GERD). Mark Bush, MD Taking Active Self  Oxycodone HCl 10 MG TABS [id number] Yes Take 1 tablet (10 mg total) by mouth every 6 (six) hours as needed. Milinda Pointer, MD Taking Active   Oxycodone HCl 10 MG TABS [id number] Yes Take 1 tablet (10 mg total) by mouth every 6 (six) hours as needed. Milinda Pointer, MD Taking Active   Oxycodone HCl 10 MG TABS [id number] Yes Take 1 tablet (10 mg total) by mouth every 6 (six) hours as needed. Milinda Pointer, MD Taking Active            Med Note Dossie Arbour, [place] A   Wed Jun 22, 2021  9:25 AM) WARNING: Not a Duplicate. Future prescription. DO NOT DELETE during hospital medication reconciliation or at discharge. [facility] Chronic Pain Management Patient   potassium citrate (UROCIT-K) 10 MEQ (1080 MG) SR tablet [id number] Yes Take 1 tablet (10 mEq total) by mouth 2 (two) times daily. Abbie Sons, MD Taking Active   rOPINIRole (REQUIP) 1 MG tablet [id number] Yes Take 1 tablet (1 mg total) by mouth at bedtime. Mark Bush, MD Taking Active Self  Saw Palmetto, Serenoa repens, (SAW PALMETTO PO) [id number] Yes Take 450 mg by mouth 2  (two) times daily.  [provider] Taking Active Self  VITAMIN B COMPLEX-C CAPS [id number] Yes Take 1 capsule by mouth daily. [provider] Taking Active Self           Med Note Maylene Roes Jun 21, 2016 10:25 AM)    vitamin E 1000 UNIT capsule [id number] Yes Take 1,000 Units by mouth daily.  [provider] Taking Active Self  Med List Note Rise Patience, RN 03/23/21 [id number]): MR 06-22-2021 UDS 12-08-2020             Patient Active Problem List   Diagnosis Date Noted   DDD (degenerative disc disease), cervical 03/16/2021   Chronic low back pain (Bilateral) w/o sciatica 03/16/2021   Chronic shoulder pain (Bilateral) (L>R) 03/16/2021   History of laparoscopic cholecystectomy (10/27/2020) 12/08/2020   Pacemaker 10/22/2020   Chronic diastolic heart failure ([place]) 10/22/2020   Prediabetes 10/22/2020   Serum lipase elevation 10/22/2020   Transaminasemia 44/62/8638   Uncomplicated opioid dependence ([place]) 09/08/2020   Trigger point with back pain (Left) 03/30/2020   Trigger point of shoulder region (scapula) (Left) 03/30/2020   Osteoarthritis of first carpometacarpal joint (thumb) of hand (Left) 03/11/2020   Chronic thumb pain (Left) 03/03/2020   Trigger middle finger of right hand 03/03/2020   Chronic hand pain (Left) 03/03/2020   Pharmacologic therapy 12/02/2019   Disorder of skeletal system 12/02/2019   Problems influencing health status 12/02/2019   Cervicalgia 02/12/2019   Trigger finger of thumb (Right) 02/12/2019   GERD (gastroesophageal reflux disease) 09/24/2018   IDA (iron deficiency anemia) 09/24/2018   Erectile dysfunction due to arterial insufficiency 09/12/2018   Trigger finger of index finger (Right) 08/16/2018   RLS (restless legs syndrome) 02/11/2018   Immunization deficiency 02/11/2018   Positive colorectal cancer screening using Cologuard test 09/21/2017   Low testosterone in male 02/18/2017   Chronic fatigue  02/08/2017    Advanced care planning/counseling discussion 08/11/2016   Chronic cervical radicular pain 06/21/2016   Long term current use of opiate analgesic 05/15/2016   OSA (obstructive sleep apnea) 04/11/2016   Hypocitraturia 03/16/2016   Cervical foraminal stenosis (Severe C6-7) (Left) 02/02/2016   Chronic sacroiliac joint pain (Bilateral) 02/02/2016   Osteoarthritis of sacroiliac joint (Bilateral) 02/02/2016   Lumbar facet hypertrophy (L1-2, L2-3, and L4-5) (Bilateral) 02/02/2016   Lumbar IVDD (intervertebral disc displacement) 02/02/2016   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) 02/02/2016   Lumbar facet syndrome (Bilateral) 02/02/2016   Osteoarthritis of shoulder (Left) 02/02/2016   Osteoarthritis of knee (Bilateral) (R>L) 02/02/2016   Radicular pain of shoulder (Bilateral) (L>R) 09/01/2015   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) 09/01/2015    Class: History of   Cervicogenic headache 09/01/2015   Cervical spondylosis with radiculopathy (Bilateral) (L>R) 09/01/2015   Cervical facet syndrome (Bilateral) (L>R) 09/01/2015   Opiate use (60 MME/Day) 05/31/2015   Encounter for therapeutic drug level monitoring 05/31/2015   Chronic neck pain (1ry area of Pain) (Bilateral) (L>R) 05/31/2015   Chronic knee pain (Bilateral) (R>L) 05/31/2015   History of permanent cardiac pacemaker placement 05/31/2015   Opioid-induced constipation (OIC) 05/31/2015   Health maintenance examination 05/07/2015   Candidal dermatitis 05/07/2015   Bradycardia 02/16/2014   Cardiac murmur 02/16/2014   Severe obesity (BMI 35.0-39.9) with comorbidity ([place]) 04/22/2013   Benign prostatic hyperplasia with urinary obstruction 05/28/2012   Nephrolithiasis 05/28/2012   Medicare annual wellness visit, subsequent 03/27/2012   HLD (hyperlipidemia) 03/27/2012   GAD (generalized anxiety disorder) 12/25/2011   Insomnia 09/27/2011   Chronic pain syndrome 03/28/2011   MDD (major depressive disorder),  recurrent episode, moderate ([place]) 03/28/2011    Immunization History  Administered Date(s) Administered   Influenza, High Dose Seasonal PF 05/04/2021   Influenza,inj,Quad PF,6+ Mos 04/22/2013, 04/28/2014, 05/07/2015, 04/11/2016, 05/22/2017, 05/16/2018   Influenza-Unspecified 06/16/2019, 04/15/2020   Moderna Covid-19 Vaccine Bivalent Booster 49yr & up 05/05/2021   Moderna Sars-Covid-2 Vaccination 11/06/2019, 12/04/2019, 07/07/2020   Pneumococcal Polysaccharide-23 05/30/2006   Tdap 11/19/2009, 04/15/2020    Conditions to be addressed/monitored:  Hypertension, Hyperlipidemia, Heart Failure, Depression, and Allergic Rhinitis, RLS  There are no care plans that you recently modified to display for this patient.     Medication Assistance: None required.  Patient affirms current coverage meets needs.  Compliance/Adherence/Medication fill history: Care Gaps: Cologuard (due 09/21/20) - planning to set up colonoscopy in 2023 AWV due  Star-Rating Drugs: Atorvastatin 20 mg - LF 05/16/21 x 90 ds; [place]100%  Patient's [facility] pharmacy [facility]:  [facility] [place]Phone: [phone]: [phone] [facility]([place]Phone: [phone]: [phone]  Uses pill box? Yes Pt endorses 100% compliance  We discussed: Current pharmacy is preferred with insurance plan and patient is satisfied with pharmacy services Patient decided to: Continue current medication management strategy  Care Plan and Follow Up Patient Decision:  Patient agrees to Care Plan and Follow-up.  Plan: Telephone follow up appointment with care management team member scheduled for:  1 month  LCharlene Brooke PharmD, BCACP Clinical Pharmacist [phone]  Current Barriers:  Unable  to independently monitor therapeutic efficacy  Pharmacist Clinical Goal(s):  Patient will achieve adherence to monitoring guidelines  and medication adherence to achieve therapeutic efficacy through collaboration with PharmD and provider.   Interventions: 1:1 collaboration with Mark Bush, MD regarding development and update of comprehensive plan of care as evidenced by provider attestation and co-signature Inter-disciplinary care team collaboration (see longitudinal plan of care) Comprehensive medication review performed; medication list updated in electronic medical record  Hyperlipidemia: (LDL goal < 100) -Controlled - baseline LDL 130, currently 46; pt endorses compliance with statin -Current treatment: Atorvastatin 20 mg daily HS Omega-3 1000 mg Aspirin 81 mg daily -Educated on Cholesterol goals; Benefits of statin for ASCVD risk reduction; -Recommended to continue current medication  Heart Failure (BP Goal < 130/80, manage symptoms and prevent exacerbations) -Not ideally controlled - BP readings are above goal at home -Hx heart block s/p dual chamber pacemaker -Last ejection fraction: 55-65% (Date: 10/2017) -HF type: Diastolic -Home BP readings: checking AM and HS 08/15/21: 135/83  08/14/21: 147/80, 150/75 08/13/21: 141/87, 142/75 08/12/21: 138/85, 147/84 08/11/21: 124/80, 142/82 08/10/21: 126/73, 159/84 08/09/21: 129/77, 151/87 08/08/21: 157/77, 124/68 08/07/21: 140/82, 160/84 08/06/21: 136/85, 139/76 08/05/21: 133/83, 147/84 -Current treatment: Furosemide 20 mg daily -Educated on BP goals and benefits of medications for prevention of heart attack, stroke and kidney damage; Daily salt intake goal < 2300 mg; Importance of home blood pressure monitoring; Proper BP monitoring technique; -Counseled to monitor BP at home daily -Recommended starting BP medication however pt is overdue for labs/PCP visit (last OV 09/14/20, last labwork 10/29/20 following cholesystectomy) - caution ACE/ARB  with chronic naproxen use and risk for AKI; may opt for thiazide diuretic or amlodipine first.  -Advised PCP f/u to address the above - pt states he will call next week. Also forwarding message to PCP/assistant to make sure appt is scheduled.  Depression/Anxiety (Goal: manage symptoms) -Controlled - pt has been on fluoxetine since at least 2013; he reports is is working "well enough" currently -Current treatment: Fluoxetine 20 mg daily HS Fluoxetine 40 mg daily AM -PHQ9: 0 (09/2020) -GAD7: 3 (02/2018) -Connected with PCP for mental health support -Educated on Benefits of medication for symptom control -Recommended to continue current medication  Chronic pain (Goal: manage pain) -Not ideally controlled - per pt report. He stopped naproxen last month due to BP risks and reports pain is significantly worse -cervical radiculopathy; lumbar disc displacement, OA of knee, shoulder -Follows with Dr Dossie Arbour (pain mgmt) -Current treatment  Oxycodone 10 m q6h (#120/month): 2-3 times per day Naproxen 500 mg BID prn - takes once a day Cyclobenzaprine 10 mg BID prn Narcan PRN -Educated on risks of chronic NSAID use including elevated BP and kidney damage; discussed naproxen could be contributing to high BP at home; would avoid Tylenol due to recently elevated LFTs (although after cholesystectomy these should normalize - need repeat labs to be sure) -Advised he could restart naproxen once daily - benefits outweigh risks at this point  Renal stones (Goal: prevent recurrence) -Controlled  - pt follows with urology; he endorses compliance with potassium citrate -Current treatment  Potassium citrate 10 mEq BID (renal stones) -Discussed role of potassium citrate in controlling/preventing renal stones -Recommended to continue current medication  Health Maintenance -Vaccine gaps: Flu, Covid booster, Prevnar, Shingrix -Updated flu and covid vaccines in record; advised pt get Prevnar vaccine at next office  visit  Patient Goals/Self-Care Activities Patient will:  - take medications as prescribed as evidenced by patient report and record review focus on medication adherence by pill box check blood pressure daily, document, and provide at future appointments -Schedule PCP appt  ASAP to address BP

## 2021-09-09 ENCOUNTER — Other Ambulatory Visit: Payer: Self-pay | Admitting: *Deleted

## 2021-09-09 ENCOUNTER — Ambulatory Visit: Payer: Medicare Other | Admitting: Family Medicine

## 2021-09-09 DIAGNOSIS — N200 Calculus of kidney: Secondary | ICD-10-CM

## 2021-09-12 ENCOUNTER — Ambulatory Visit
Admission: RE | Admit: 2021-09-12 | Discharge: 2021-09-12 | Disposition: A | Discharge status: Home / Self Care | Payer: Medicare Other | Attending: Urology | Admitting: Urology

## 2021-09-12 ENCOUNTER — Encounter: Payer: Self-pay | Admitting: Urology

## 2021-09-12 ENCOUNTER — Ambulatory Visit: Payer: Medicare Other | Admitting: Urology

## 2021-09-12 ENCOUNTER — Other Ambulatory Visit: Payer: Self-pay

## 2021-09-12 ENCOUNTER — Ambulatory Visit
Admission: RE | Admit: 2021-09-12 | Discharge: 2021-09-12 | Disposition: A | Discharge status: Home / Self Care | Payer: Medicare Other | Source: Ambulatory Visit | Attending: Urology | Admitting: Urology

## 2021-09-12 VITALS — BP 138/74 | HR 68 | Ht 67.0 in | Wt 225.0 lb

## 2021-09-12 DIAGNOSIS — N200 Calculus of kidney: Secondary | ICD-10-CM

## 2021-09-12 DIAGNOSIS — I878 Other specified disorders of veins: Secondary | ICD-10-CM | POA: Diagnosis not present

## 2021-09-12 DIAGNOSIS — Z9049 Acquired absence of other specified parts of digestive tract: Secondary | ICD-10-CM | POA: Diagnosis not present

## 2021-09-12 NOTE — Progress Notes (Signed)
09/12/2021 2:00 PM   Mark [name] Sep 11, 1955 [id number]  Referring provider: Ria Bush, MD [address] [place],  [place] [address]  Chief Complaint  Patient presents with   Nephrolithiasis    HPI: 66 y.o. male presents for annual follow-up.  Doing well since last visit No bothersome LUTS Denies dysuria, gross hematuria Denies flank, abdominal or pelvic pain  PMH: Past Medical History:  Diagnosis Date   Anxiety    Arthritis    Chronic pain    Depression    Heart murmur    History of kidney stones April 12,2017   removed two kidney stones about 1 cm in size   History of permanent cardiac pacemaker placement 05/31/2015   Hydrocele in adult 05/07/2015   Kidney stones    Migraine    MRSA carrier 2019   Presence of permanent cardiac pacemaker    Prostatitis    Sleep apnea    Does not wear CPAP    Surgical History: Past Surgical History:  Procedure Laterality Date   CARDIOVASCULAR STRESS TEST  2014   Dr Mark Clock TUNNEL RELEASE Bilateral 2006   [place]  2008   C5/6   CHOLECYSTECTOMY N/A 10/27/2020   Procedure: LAPAROSCOPIC CHOLECYSTECTOMY;  Surgeon: Mark Skeans, MD;  Location: [place];  Service: General;  Laterality: N/A;   COLONOSCOPY  07/2007   prostate nodule, hemorrhoid, rpt 5 yrs [name])   ERCP N/A 10/26/2020   Procedure: ENDOSCOPIC RETROGRADE CHOLANGIOPANCREATOGRAPHY (ERCP);  Surgeon: Mark Ada, MD;  Location: [place];  Service: Endoscopy;  Laterality: N/A;  choledocholithiasis   ESOPHAGOGASTRODUODENOSCOPY (EGD) WITH PROPOFOL N/A 10/26/2020   Procedure: ESOPHAGOGASTRODUODENOSCOPY (EGD) WITH PROPOFOL;  Surgeon: Mark Ada, MD;  Location: [facility];  Service: Endoscopy;  Laterality: N/A;   EUS  10/26/2020   Procedure: UPPER ENDOSCOPIC ULTRASOUND (EUS) LINEAR;  Surgeon: Mark Ada, MD;  [facility];  Service: Endoscopy;;   IMPLANTABLE CARDIOVERTER DEFIBRILLATOR (ICD) GENERATOR CHANGE Left  12/13/2017   Procedure: PACER CHANGE OUT;  Surgeon: Isaias Cowman, MD;  Location: [facility];  Service: Cardiovascular;  Laterality: Left;   LAPAROSCOPIC CHOLECYSTECTOMY  10/2020   LITHOTRIPSY Bilateral 2012   Maddock Finigan   LITHOTRIPSY Left 11/2015   x2 Mark [name]   pace maker revision  12/13/2017   PACEMAKER PLACEMENT  2009   s/p Medtronic Adapta DR Pacemaker (Dr. Ubaldo [name])   REMOVAL OF STONES  10/26/2020   Procedure: REMOVAL OF STONES;  Surgeon: Mark Ada, MD;  Location: [place];  Service: Endoscopy;;   ROTATOR CUFF REPAIR Left 2009   SPHINCTEROTOMY  10/26/2020   Procedure: Mark [name];  Surgeon: Mark Ada, MD;  Location[facility] END[facility];  Service: Endoscopy;;   [place]  2008   Left    Home Medications:  Allergies as of 09/12/2021       Reactions   Ciprofloxacin Other (See Comments)   PANIC ATTACKS (Fluoroquinolones)   Metformin Other (See Comments)   Bad acid reflux   Buprenorphine Hcl Nausea Only   Morphine And Related Nausea Only   Penicillins Rash   Rash Has patient had a PCN reaction causing immediate rash, facial/tongue/throat swelling, SOB or lightheadedness with hypotension: Unknown Has patient had a PCN reaction causing severe rash involving mucus membranes or skin necrosis: Unknown Has patient had a PCN reaction that required hospitalization: No Has patient had a PCN reaction occurring within the last 10 years: No CHILDHOOD REACTION. If all of the above answers are "NO",  then may proceed with Cephalosporin use.        Medication List        Accurate as of September 12, 2021  2:00 PM. If you have any questions, ask your nurse or doctor.          aspirin 81 MG tablet Take 81 mg by mouth daily.   atorvastatin 20 MG tablet Commonly known as: LIPITOR Take 1 tablet by mouth at bedtime.   CertaVite Senior/Antioxidant Tabs Take 1 tablet by mouth daily.   cetirizine 10 MG tablet Commonly known as: ZYRTEC Take 10 mg by  mouth at bedtime.   clindamycin 150 MG capsule Commonly known as: CLEOCIN Take 150 mg by mouth as needed. Prior to dental appt   CRANBERRY PO Take 4,200 mg by mouth at bedtime.   cyclobenzaprine 10 MG tablet Commonly known as: FLEXERIL TAKE ONE-HALF TO ONE TABLET BY MOUTH 2 TIMES DAILY AS NEEDED FOR MUSCLE SPASMS   dicyclomine 20 MG tablet Commonly known as: BENTYL Take 20 mg by mouth 2 (two) times daily as needed for spasms.   diphenhydrAMINE 50 MG capsule Commonly known as: BENADRYL Take 50 mg by mouth at bedtime.   ferrous sulfate 325 (65 FE) MG EC tablet Take 325 mg by mouth daily with breakfast.   FLUoxetine 20 MG capsule Commonly known as: PROZAC Take 1 capsule (20 mg total) by mouth daily. What changed: when to take this   FLUoxetine 40 MG capsule Commonly known as: PROZAC Take 1 capsule (40 mg total) by mouth daily. What changed: when to take this   fluticasone 50 MCG/ACT nasal spray Commonly known as: FLONASE Place 2 sprays into both nostrils daily.   furosemide 20 MG tablet Commonly known as: LASIX Take 1 tablet (20 mg total) by mouth daily.   Garlic 4098 MG Caps Take 1,000 mg by mouth daily.   naproxen 500 MG tablet Commonly known as: NAPROSYN TAKE 1 TABLET BY MOUTH 2 TIMES DAILY AS NEEDED FOR MODERATE PAIN   Narcan 4 MG/0.1ML Liqd nasal spray kit Generic drug: naloxone Spray into one nostril. Repeat with second device into other nostril after 2-3 minutes if no or minimal response.   Omega-3 1000 MG Caps Take 1,000 mg by mouth at bedtime.   omeprazole 20 MG capsule Commonly known as: PRILOSEC Take 1 capsule (20 mg total) by mouth daily as needed (GERD).   Oxycodone HCl 10 MG Tabs Take 1 tablet (10 mg total) by mouth every 6 (six) hours as needed.   Oxycodone HCl 10 MG Tabs Take 1 tablet (10 mg total) by mouth every 6 (six) hours as needed.   Oxycodone HCl 10 MG Tabs Take 1 tablet (10 mg total) by mouth every 6 (six) hours as needed.    potassium citrate 10 MEQ (1080 MG) SR tablet Commonly known as: UROCIT-K Take 1 tablet (10 mEq total) by mouth 2 (two) times daily.   rOPINIRole 2 MG tablet Commonly known as: REQUIP Take 1 tablet (2 mg total) by mouth at bedtime.   SAW PALMETTO PO Take 450 mg by mouth 2 (two) times daily.   Vitamin B Complex-C Caps Take 1 capsule by mouth daily.   vitamin C 1000 MG tablet Take 1,000 mg by mouth daily.   Vitamin D 50 MCG (2000 UT) Caps Take 2,000 Units by mouth daily.   vitamin E 1000 UNIT capsule Take 1,000 Units by mouth daily.        Allergies:  Allergies  Allergen Reactions  Ciprofloxacin Other (See Comments)    PANIC ATTACKS (Fluoroquinolones)    Metformin Other (See Comments)    Bad acid reflux   Buprenorphine Hcl Nausea Only   Morphine And Related Nausea Only   Penicillins Rash    Rash Has patient had a PCN reaction causing immediate rash, facial/tongue/throat swelling, SOB or lightheadedness with hypotension: Unknown Has patient had a PCN reaction causing severe rash involving mucus membranes or skin necrosis: Unknown Has patient had a PCN reaction that required hospitalization: No Has patient had a PCN reaction occurring within the last 10 years: No CHILDHOOD REACTION. If all of the above answers are "NO", then may proceed with Cephalosporin use.     Family History: Family History  Problem Relation Age of Onset   Heart disease Mother    Diabetes Mother    Arthritis Mother    Cancer Mother        Breast Cancer   Heart block Father    Heart disease Father    Arthritis Father    Cancer Father        skin cancer    Social History:  reports that he has never smoked. He has never used smokeless tobacco. He reports that he does not drink alcohol and does not use drugs.   Physical Exam: BP 138/74    Pulse 68    Ht 5' 7"  (1.702 m)    Wt 225 lb (102.1 kg)    BMI 35.24 kg/m   Constitutional:  Alert and oriented, No acute distress. HEENT: [place] AT,  moist mucus membranes.  Trachea midline, no masses. Cardiovascular: No clubbing, cyanosis, or edema. Respiratory: Normal respiratory effort, no increased work of breathing. Psychiatric: Normal mood and affect.   Pertinent Imaging: Images from a KUB performed earlier today were personally reviewed and interpreted.  Stable bilateral renal calculi.  Some separation in the left-sided calculi from last years x-ray   Assessment & Plan:    1.  Bilateral nephrolithiasis Asymptomatic without significant interval change Options of ureteroscopy and surveillance were discussed and he has elected the latter SWL discussed but most likely would need several procedures Continue potassium citrate  2.  Prostate cancer screening PSA checked annually by Dr. Danise Mina and he is scheduled for follow-up next month   Abbie Sons, MD  [facility] [address], [place], [place] [address] [phone]

## 2021-09-20 NOTE — Progress Notes (Signed)
PROVIDER NOTE: Information contained herein reflects review and annotations entered in association with encounter. Interpretation of such information and data should be left to medically-trained personnel. Information provided to patient can be located elsewhere in the medical record under "Patient Instructions". Document created using STT-dictation technology, any transcriptional errors that may result from process are unintentional.    Patient: Mark [name]  Service Category: E/M  Provider: Gaspar Cola, MD  DOB: 1955-11-04  DOS: 09/21/2021  Specialty: Interventional Pain Management  MRN: [id number]  Setting: Ambulatory outpatient  PCP: Ria Bush, MD  Type: Established Patient    Referring Provider: Ria Bush, MD  Location: Office  Delivery: Face-to-face     HPI  Mr. RANA ADORNO, a 66 y.o. year old male, is here today because of his Chronic pain syndrome [G89.4]. Mr. Hoobler primary complain today is Neck Pain Last encounter: My last encounter with him was on 06/22/2021. Pertinent problems: Mr. 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); Cervicogenic headache; Cervical spondylosis with radiculopathy (Bilateral) (L>R); Cervical facet syndrome (Bilateral) (L>R); Cervical foraminal stenosis (Severe C6-7) (Left); Chronic sacroiliac joint pain (Bilateral); Osteoarthritis of sacroiliac joint (Bilateral); Lumbar facet hypertrophy (L1-2, L2-3, and L4-5) (Bilateral); Lumbar IVDD (intervertebral disc displacement); 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); Lumbar facet syndrome (Bilateral); Osteoarthritis of shoulder (Left); Osteoarthritis of knee (Bilateral) (R>L); Chronic cervical radicular pain; Chronic fatigue; RLS (restless legs syndrome); Trigger finger of index finger (Right);  Cervicalgia; Trigger finger of thumb (Right); Chronic thumb pain (Left); Trigger middle finger of right hand; Chronic hand pain (Left); Osteoarthritis of first carpometacarpal joint (thumb) of hand (Left); Trigger point with back pain (Left); Trigger point of shoulder region (scapula) (Left); History of laparoscopic cholecystectomy (10/27/2020); DDD (degenerative disc disease), cervical; Chronic low back pain (Bilateral) w/o sciatica; and Chronic shoulder pain (Bilateral) (L>R) on their pertinent problem list. Pain Assessment: Severity of Chronic pain is reported as a 4 /10. Location: Neck Mid/left and right shoulder down arms to hands, effects all fingers. Onset: More than a month ago. Quality: Aching, Numbness, Discomfort, Sharp, Nagging. Timing: Intermittent. Modifying factor(s): rlaxing, sitting, meds. Vitals:  height is 5' 7"  (1.702 m) and weight is 225 lb (102.1 kg). His temperature is 97.3 F (36.3 C) (abnormal). His blood pressure is 148/86 (abnormal) and his pulse is 71. His respiration is 16 and oxygen saturation is 100%.   Reason for encounter: medication management.   The patient indicates doing well with the current medication regimen. No adverse reactions or side effects reported to the medications.   RTCB: 12/24/2021  Pharmacotherapy Assessment  Analgesic: Oxycodone IR 10 mg, 1 tab PO q 6 hrs (40 mg/day total of oxycodone) MME/day: 60 mg/day.   Monitoring: [place] PMP: PDMP reviewed during this encounter.       Pharmacotherapy: No side-effects or adverse reactions reported. Compliance: No problems identified. Effectiveness: Clinically acceptable.  Ignatius Specking, RN  09/21/2021 11:02 AM  Sign when Signing Visit Nursing Pain Medication Assessment:  Safety precautions to be maintained throughout the outpatient stay will include: orient to surroundings, keep bed in low position, maintain call bell within reach at all times, provide assistance with transfer out of bed and ambulation.   Medication Inspection Compliance: Pill count conducted under aseptic conditions, in front of the patient. Neither the pills nor the bottle was removed from the patient's sight at  any time. Once count was completed pills were immediately returned to the patient in their original bottle.  Medication: Oxycodone IR Pill/Patch Count:  28 of 120 pills remain Pill/Patch Appearance: Markings consistent with prescribed medication Bottle Appearance: Standard pharmacy container. Clearly labeled. Filled Date: 01 / 10 / 2023 Last Medication intake:  Today    UDS:  Summary  Date Value Ref Range Status  12/08/2020 Note  Final    Comment:    ==================================================================== [employer] [facility] 13 (MW) ==================================================================== Test                             Result       Flag       Units  Drug Present and Declared for Prescription Verification   Oxycodone                      1357         EXPECTED   ng/mg creat   Oxymorphone                    128          EXPECTED   ng/mg creat   Noroxycodone                   3434         EXPECTED   ng/mg creat    Sources of oxycodone include scheduled prescription medications.    Oxymorphone and noroxycodone are expected metabolites of oxycodone.    Oxymorphone is also available as a scheduled prescription medication.  ==================================================================== Test                      Result    Flag   Units      Ref Range   Creatinine              68               mg/dL      >=20 ==================================================================== Declared Medications:  The flagging and interpretation on this report are based on the  following declared medications.  Unexpected results may arise from  inaccuracies in the declared medications.   **Note: The testing scope of this panel includes these medications:   Oxycodone   **Note: The testing scope of  this panel does not include the  following reported medications:   Aspirin  Atorvastatin  Cetirizine (Zyrtec)  Cranberry  Cyclobenzaprine (Flexeril)  Dicyclomine (Bentyl)  Diphenhydramine (Benadryl)  Fluoxetine (Prozac)  Fluticasone (Flonase)  Iron  Melatonin  Multivitamin  Naloxone (Narcan)  Naproxen  Omega-3 Fatty Acids  Omeprazole  Potassium  Ropinirole  Supplement  Vitamin B  Vitamin C  Vitamin E ==================================================================== For clinical consultation, please call [phone]. ====================================================================      ROS  Constitutional: Denies any fever or chills Gastrointestinal: No reported hemesis, hematochezia, vomiting, or acute GI distress Musculoskeletal: Denies any acute onset joint swelling, redness, loss of ROM, or weakness Neurological: No reported episodes of acute onset apraxia, aphasia, dysarthria, agnosia, amnesia, paralysis, loss of coordination, or loss of consciousness  Medication Review  [employer]ite Senior/Antioxidant, Cranberry, FLUoxetine, Garlic, Omega-3, Oxycodone HCl, Saw Palmetto (Serenoa repens), Vitamin B Complex-C, Vitamin D, aspirin, atorvastatin, cetirizine, clindamycin, cyclobenzaprine, dicyclomine, diphenhydrAMINE, ferrous sulfate, fluticasone, furosemide, naloxone, naproxen, omeprazole, potassium citrate, rOPINIRole, vitamin C, and vitamin E  History Review  Allergy: Mr. Brandis is allergic to ciprofloxacin, metformin, buprenorphine hcl,  morphine and related, and penicillins. Drug: Mr. Marohl  reports no history of drug use. Alcohol:  reports no history of alcohol use. Tobacco:  reports that he has never smoked. He has never used smokeless tobacco. Social: Mr. Chaddock  reports that he has never smoked. He has never used smokeless tobacco. He reports that he does not drink alcohol and does not use drugs. Medical:  has a past medical history of Anxiety, Arthritis,  Chronic pain, Depression, Heart murmur, History of kidney stones (April 12,2017), History of permanent cardiac pacemaker placement (05/31/2015), Hydrocele in adult (05/07/2015), Kidney stones, Migraine, MRSA carrier (2019), Presence of permanent cardiac pacemaker, Prostatitis, and Sleep apnea. Surgical: Mr. Souter  has a past surgical history that includes Tonsillectomy (1964); Carpal tunnel release (Bilateral, 2006); Ulnar nerve repair (2008); Cervical spine surgery (2008); Rotator cuff repair (Left, 2009); pacemaker placement (2009); Lithotripsy (Bilateral, 2012); Lithotripsy (Left, 11/2015); Cardiovascular stress test (2014); Colonoscopy (07/2007); implantable cardioverter defibrillator (icd) generator change (Left, 12/13/2017); pace maker revision (12/13/2017); Laparoscopic cholecystectomy (10/2020); Cholecystectomy (N/A, 10/27/2020); ERCP (N/A, 10/26/2020); Esophagogastroduodenoscopy (egd) with propofol (N/A, 10/26/2020); EUS (10/26/2020); Sphincterotomy (10/26/2020); and removal of stones (10/26/2020). Family: family history includes Arthritis in his father and mother; Cancer in his father and mother; Diabetes in his mother; Heart block in his father; Heart disease in his father and mother.  Laboratory Chemistry Profile   Renal Lab Results  Component Value Date   BUN 13 10/29/2020   CREATININE 0.89 10/29/2020   GFR 91.41 09/07/2020   GFRAA >60 12/06/2017   GFRNONAA >60 10/29/2020    Hepatic Lab Results  Component Value Date   AST 137 (H) 10/29/2020   ALT 163 (H) 10/29/2020   ALBUMIN 2.6 (L) 10/29/2020   ALKPHOS 170 (H) 10/29/2020   HCVAB NEGATIVE 07/22/2013   LIPASE 39 10/28/2020    Electrolytes Lab Results  Component Value Date   NA 137 10/29/2020   K 3.7 10/29/2020   CL 105 10/29/2020   CALCIUM 8.6 (L) 10/29/2020   MG 2.1 09/01/2015    Bone Lab Results  Component Value Date   25OHVITD1 63 02/02/2016   25OHVITD2 <1.0 02/02/2016   25OHVITD3 63 02/02/2016   TESTOSTERONE 216.29 (L)  02/13/2017    Inflammation (CRP: Acute Phase) (ESR: Chronic Phase) Lab Results  Component Value Date   CRP 1.7 (H) 09/01/2015   ESRSEDRATE 6 09/01/2015         Note: Above Lab results reviewed.  Recent Imaging Review  Abdomen 1 view (KUB) CLINICAL DATA:  Recheck of kidney stones.  EXAM: ABDOMEN - 1 VIEW  COMPARISON:  Flat plate study 89/16/9450.  FINDINGS: Interval cholecystectomy. Clustered stones are again noted in the lower pole of both kidneys measuring up to 1.1 cm on the left and up to 1.4 cm on the right. There are no appreciable stones in the upper poles. Bilateral pelvic phleboliths are unchanged.  The bowel gas pattern is nonobstructive. Visceral shadows are stable. There is no supine evidence of free air. There are degenerative changes of the spine.  IMPRESSION: Bilateral nephrolithiasis in the lower poles. No interval changes are seen. No pathologic calcification is seen in the plane of either ureter. Interval cholecystectomy.  Electronically Signed   By: Telford Nab M.D.   On: 09/12/2021 23:05 Note: Reviewed        Physical Exam  General appearance: Well nourished, well developed, and well hydrated. In no apparent acute distress Mental status: Alert, oriented x 3 (person, place, & time)  Respiratory: No evidence of acute respiratory distress Eyes: PERLA Vitals: BP (!) 148/86    Pulse 71    Temp (!) 97.3 F (36.3 C)    Resp 16    Ht 5' 7"  (1.702 m)    Wt 225 lb (102.1 kg)    SpO2 100%    BMI 35.24 kg/m  BMI: Estimated body mass index is 35.24 kg/m as calculated from the following:   Height as of this encounter: 5' 7"  (1.702 m).   Weight as of this encounter: 225 lb (102.1 kg). Ideal: Ideal body weight: 66.1 kg (145 lb 11.6 oz) Adjusted ideal body weight: 80.5 kg (177 lb 6.9 oz)  Assessment   Status Diagnosis  Controlled Controlled Controlled 1. Chronic pain syndrome   2. Chronic neck pain (1ry area of Pain) (Bilateral) (L>R)   3.  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 Cervicalgia   5. Cervicogenic headache   6. Chronic cervical radicular pain   7. Chronic shoulder pain (Bilateral) (L>R)   8. Chronic low back pain (Bilateral) w/o sciatica   9. Chronic sacroiliac joint pain (Bilateral)   10. Lumbar facet syndrome (Bilateral)   11. Pharmacologic therapy   12. Chronic use of opiate for therapeutic purpose   13. Encounter for medication management      Updated Problems: No problems updated.  Plan of Care  Problem-specific:  No problem-specific Assessment & Plan notes found for this encounter.  Mr. OLANREWAJU OSBORN has a current medication list which includes the following long-term medication(s): cetirizine, dicyclomine, diphenhydramine, fluoxetine, fluoxetine, fluticasone, furosemide, omeprazole, [START ON 09/25/2021] oxycodone hcl, [START ON 10/25/2021] oxycodone hcl, [START ON 11/24/2021] oxycodone hcl, and ropinirole.  Pharmacotherapy (Medications Ordered): Meds ordered this encounter  Medications   Oxycodone HCl 10 MG TABS    Sig: Take 1 tablet (10 mg total) by mouth every 6 (six) hours as needed. Each refill must last 30 days.    Dispense:  120 tablet    Refill:  0    DO NOT: delete (not duplicate); no partial-fill (will deny script to complete), no refill request (F/U required). DISPENSE: 1 day early if closed on fill date. WARN: No CNS-depressants within 8 hrs of med.   Oxycodone HCl 10 MG TABS    Sig: Take 1 tablet (10 mg total) by mouth every 6 (six) hours as needed. Each refill must last 30 days.    Dispense:  120 tablet    Refill:  0    DO NOT: delete (not duplicate); no partial-fill (will deny script to complete), no refill request (F/U required). DISPENSE: 1 day early if closed on fill date. WARN: No CNS-depressants within 8 hrs of med.   Oxycodone HCl 10 MG TABS    Sig: Take 1 tablet (10 mg total) by mouth every 6 (six) hours as needed. Each refill must last 30 days.     Dispense:  120 tablet    Refill:  0    DO NOT: delete (not duplicate); no partial-fill (will deny script to complete), no refill request (F/U required). DISPENSE: 1 day early if closed on fill date. WARN: No CNS-depressants within 8 hrs of med.   naloxone (NARCAN) nasal spray 4 mg/0.1 mL    Sig: Place 1 spray into the nose as needed for up to 365 doses (for opioid-induced respiratory depresssion). In case of emergency (overdose), spray once into each nostril. If no response within 3 minutes, repeat application and call [phone].  Dispense:  1 each    Refill:  0    Instruct patient in proper use of device.   Orders:  No orders of the defined types were placed in this encounter.  Follow-up plan:   Return in about 3 months (around 12/24/2021) for Eval-day (M,W), (F2F), (MM).     Considering:   Diagnostic right hand digit 2 trigger finger injection Therapeutic bilateral IA Hyalgan knee injections  Diagnostic bilateral cervical facet block  Possible bilateral cervical facet RFA  Diagnostic left sided CESI  Diagnostic bilateral IA knee injection  Diagnostic bilateral genicular NB  Possible bilateral genicular nerve RFA  Diagnostic bilateral sacroiliac joint block  Possible bilateral sacroiliac joint RFA  Diagnostic bilateral IA shoulder joint injection  Diagnostic bilateral suprascapular NB  Possible bilateral suprascapular nerve RFA  Diagnostic bilateral lumbar facet block  Possible bilateral lumbar facet RFA  Diagnostic left L4-5 LESI    Palliative PRN treatment(s):   Bilateral intra-articular Hyalgan knee injection series (injection #4)  Diagnostic bilateral cervical facet block  Diagnostic left sided CESI  Diagnostic bilateral IA knee injection  Diagnostic bilateral genicular NB  Diagnostic bilateral sacroiliac joint block  Diagnostic bilateral IA shoulder joint injection  Diagnostic bilateral suprascapular NB  Diagnostic bilateral lumbar facet block   Diagnostic left L4-5 LESI      Recent Visits No visits were found meeting these conditions. Showing recent visits within past 90 days and meeting all other requirements Today's Visits Date Type Provider Dept  09/21/21 Office Visit Milinda Pointer, MD [facility]  Showing today's visits and meeting all other requirements Future Appointments Date Type Provider Dept  12/12/21 Appointment Milinda Pointer, MD [facility]  Showing future appointments within next 90 days and meeting all other requirements  I discussed the assessment and treatment plan with the patient. The patient was provided an opportunity to ask questions and all were answered. The patient agreed with the plan and demonstrated an understanding of the instructions.  Patient advised to call back or seek an in-person evaluation if the symptoms or condition worsens.  Duration of encounter: 30 minutes.  Note by: Gaspar Cola, MD Date: 09/21/2021; Time: 11:45 AM

## 2021-09-21 ENCOUNTER — Encounter: Payer: Self-pay | Admitting: Pain Medicine

## 2021-09-21 ENCOUNTER — Ambulatory Visit: Payer: Medicare Other | Attending: Pain Medicine | Admitting: Pain Medicine

## 2021-09-21 ENCOUNTER — Other Ambulatory Visit: Payer: Self-pay

## 2021-09-21 VITALS — BP 148/86 | HR 71 | Temp 97.3°F | Resp 16 | Ht 67.0 in | Wt 225.0 lb

## 2021-09-21 DIAGNOSIS — G894 Chronic pain syndrome: Secondary | ICD-10-CM | POA: Diagnosis not present

## 2021-09-21 DIAGNOSIS — M47816 Spondylosis without myelopathy or radiculopathy, lumbar region: Secondary | ICD-10-CM

## 2021-09-21 DIAGNOSIS — Z79899 Other long term (current) drug therapy: Secondary | ICD-10-CM | POA: Diagnosis not present

## 2021-09-21 DIAGNOSIS — M25512 Pain in left shoulder: Secondary | ICD-10-CM | POA: Diagnosis not present

## 2021-09-21 DIAGNOSIS — G4486 Cervicogenic headache: Secondary | ICD-10-CM | POA: Diagnosis not present

## 2021-09-21 DIAGNOSIS — M542 Cervicalgia: Secondary | ICD-10-CM

## 2021-09-21 DIAGNOSIS — M545 Low back pain, unspecified: Secondary | ICD-10-CM | POA: Diagnosis not present

## 2021-09-21 DIAGNOSIS — M25511 Pain in right shoulder: Secondary | ICD-10-CM | POA: Diagnosis not present

## 2021-09-21 DIAGNOSIS — M961 Postlaminectomy syndrome, not elsewhere classified: Secondary | ICD-10-CM | POA: Diagnosis not present

## 2021-09-21 DIAGNOSIS — M5412 Radiculopathy, cervical region: Secondary | ICD-10-CM

## 2021-09-21 DIAGNOSIS — G8929 Other chronic pain: Secondary | ICD-10-CM

## 2021-09-21 DIAGNOSIS — Z79891 Long term (current) use of opiate analgesic: Secondary | ICD-10-CM | POA: Diagnosis not present

## 2021-09-21 DIAGNOSIS — M533 Sacrococcygeal disorders, not elsewhere classified: Secondary | ICD-10-CM

## 2021-09-21 MED ORDER — OXYCODONE HCL 10 MG PO TABS: 10.0000 mg | ORAL_TABLET | Freq: Four times a day (QID) | ORAL | 0 refills | Status: DC | PRN

## 2021-09-21 MED ORDER — NALOXONE HCL 4 MG/0.1ML NA LIQD: 1.0000 | NASAL | 0 refills | Status: DC | PRN

## 2021-09-21 NOTE — Patient Instructions (Signed)

## 2021-09-21 NOTE — Progress Notes (Signed)
Nursing Pain Medication Assessment:  Safety precautions to be maintained throughout the outpatient stay will include: orient to surroundings, keep bed in low position, maintain call bell within reach at all times, provide assistance with transfer out of bed and ambulation.  Medication Inspection Compliance: Pill count conducted under aseptic conditions, in front of the patient. Neither the pills nor the bottle was removed from the patient's sight at any time. Once count was completed pills were immediately returned to the patient in their original bottle.  Medication: Oxycodone IR Pill/Patch Count:  28 of 120 pills remain Pill/Patch Appearance: Markings consistent with prescribed medication Bottle Appearance: Standard pharmacy container. Clearly labeled. Filled Date: 01 / 10 / 2023 Last Medication intake:  Today

## 2021-10-05 ENCOUNTER — Telehealth: Payer: Self-pay

## 2021-10-05 ENCOUNTER — Encounter: Payer: Self-pay | Admitting: Family Medicine

## 2021-10-05 ENCOUNTER — Other Ambulatory Visit: Payer: Self-pay

## 2021-10-05 ENCOUNTER — Ambulatory Visit (INDEPENDENT_AMBULATORY_CARE_PROVIDER_SITE_OTHER): Payer: Medicare Other | Admitting: Family Medicine

## 2021-10-05 VITALS — BP 142/76 | HR 77 | Temp 97.5°F | Ht 67.0 in | Wt 225.4 lb

## 2021-10-05 DIAGNOSIS — G894 Chronic pain syndrome: Secondary | ICD-10-CM | POA: Diagnosis not present

## 2021-10-05 DIAGNOSIS — R001 Bradycardia, unspecified: Secondary | ICD-10-CM

## 2021-10-05 DIAGNOSIS — I10 Essential (primary) hypertension: Secondary | ICD-10-CM | POA: Diagnosis not present

## 2021-10-05 DIAGNOSIS — G4733 Obstructive sleep apnea (adult) (pediatric): Secondary | ICD-10-CM

## 2021-10-05 DIAGNOSIS — N200 Calculus of kidney: Secondary | ICD-10-CM | POA: Diagnosis not present

## 2021-10-05 DIAGNOSIS — Z950 Presence of cardiac pacemaker: Secondary | ICD-10-CM

## 2021-10-05 MED ORDER — LOSARTAN POTASSIUM 25 MG PO TABS: 25.0000 mg | ORAL_TABLET | Freq: Every day | ORAL | 6 refills | Status: DC

## 2021-10-05 NOTE — Assessment & Plan Note (Signed)
Sees urology, now on Urocit-K (potassium citrate) BID with benefit.  ?

## 2021-10-05 NOTE — Patient Instructions (Addendum)
Labs today  Start losartan 25mg  daily.  Continue other medicines Schedule lab visit in 10 days to recheck potassium and kidneys.  Good to see you today. Return for physical as scheduled.  Continue monitoring blood pressures, let know if consistently above goal.   Your goal blood pressure is <140/80, ideally lower. Work on low salt/sodium diet - goal <2gm (2,000mg ) per day. Eat a diet high in fruits/vegetables and whole grains.  Look into mediterranean and DASH diet. Goal activity is 154min/wk of moderate intensity exercise.  This can be split into 30 minute chunks.  If you are not at this level, you can start with smaller 10-15 min increments and slowly build up activity. Look at www.heart.org for more resources   DASH Eating Plan DASH stands for Dietary Approaches to Stop Hypertension. The DASH eating plan is a healthy eating plan that has been shown to: Reduce high blood pressure (hypertension). Reduce your risk for type 2 diabetes, heart disease, and stroke. Help with weight loss. What are tips for following this plan? Reading food labels Check food labels for the amount of salt (sodium) per serving. Choose foods with less than 5 percent of the Daily Value of sodium. Generally, foods with less than 300 milligrams (mg) of sodium per serving fit into this eating plan. To find whole grains, look for the word "whole" as the first word in the ingredient list. Shopping Buy products labeled as "low-sodium" or "no salt added." Buy fresh foods. Avoid canned foods and pre-made or frozen meals. Cooking Avoid adding salt when cooking. Use salt-free seasonings or herbs instead of table salt or sea salt. Check with your health care provider or pharmacist before using salt substitutes. Do not fry foods. Cook foods using healthy methods such as baking, boiling, grilling, roasting, and broiling instead. Cook with heart-healthy oils, such as olive, canola, avocado, soybean, or sunflower oil. Meal  planning  Eat a balanced diet that includes: 4 or more servings of fruits and 4 or more servings of vegetables each day. Try to fill one-half of your plate with fruits and vegetables. 6-8 servings of whole grains each day. Less than 6 oz (170 g) of lean meat, poultry, or fish each day. A 3-oz (85-g) serving of meat is about the same size as a deck of cards. One egg equals 1 oz (28 g). 2-3 servings of low-fat dairy each day. One serving is 1 cup (237 mL). 1 serving of nuts, seeds, or beans 5 times each week. 2-3 servings of heart-healthy fats. Healthy fats called omega-3 fatty acids are found in foods such as walnuts, flaxseeds, fortified milks, and eggs. These fats are also found in cold-water fish, such as sardines, salmon, and mackerel. Limit how much you eat of: Canned or prepackaged foods. Food that is high in trans fat, such as some fried foods. Food that is high in saturated fat, such as fatty meat. Desserts and other sweets, sugary drinks, and other foods with added sugar. Full-fat dairy products. Do not salt foods before eating. Do not eat more than 4 egg yolks a week. Try to eat at least 2 vegetarian meals a week. Eat more home-cooked food and less restaurant, buffet, and fast food. Lifestyle When eating at a restaurant, ask that your food be prepared with less salt or no salt, if possible. If you drink alcohol: Limit how much you use to: 0-1 drink a day for women who are not pregnant. 0-2 drinks a day for men. Be aware of how  much alcohol is in your drink. In the [place] one drink equals one 12 oz bottle of beer (355 mL), one 5 oz glass of wine (148 mL), or one 1 oz glass of hard liquor (44 mL). General information Avoid eating more than 2,300 mg of salt a day. If you have hypertension, you may need to reduce your sodium intake to 1,500 mg a day. Work with your health care provider to maintain a healthy body weight or to lose weight. Ask what an ideal weight is for you. Get at  least 30 minutes of exercise that causes your heart to beat faster (aerobic exercise) most days of the week. Activities may include walking, swimming, or biking. Work with your health care provider or dietitian to adjust your eating plan to your individual calorie needs. What foods should I eat? Fruits All fresh, dried, or frozen fruit. Canned fruit in natural juice (without added sugar). Vegetables Fresh or frozen vegetables (raw, steamed, roasted, or grilled). Low-sodium or reduced-sodium tomato and vegetable juice. Low-sodium or reduced-sodium tomato sauce and tomato paste. Low-sodium or reduced-sodium canned vegetables. Grains Whole-grain or whole-wheat bread. Whole-grain or whole-wheat pasta. Brown rice. Orpah Cobb. Bulgur. Whole-grain and low-sodium cereals. Pita bread. Low-fat, low-sodium crackers. Whole-wheat flour tortillas. Meats and other proteins Skinless chicken or [place]. Ground chicken or [place]. Pork with fat trimmed off. Fish and seafood. Egg whites. Dried beans, peas, or lentils. Unsalted nuts, nut butters, and seeds. Unsalted canned beans. Lean cuts of beef with fat trimmed off. Low-sodium, lean precooked or cured meat, such as sausages or meat loaves. Dairy Low-fat (1%) or fat-free (skim) milk. Reduced-fat, low-fat, or fat-free cheeses. Nonfat, low-sodium ricotta or cottage cheese. Low-fat or nonfat yogurt. Low-fat, low-sodium cheese. Fats and oils Soft margarine without trans fats. Vegetable oil. Reduced-fat, low-fat, or light mayonnaise and salad dressings (reduced-sodium). Canola, safflower, olive, avocado, soybean, and sunflower oils. Avocado. Seasonings and condiments Herbs. Spices. Seasoning mixes without salt. Other foods Unsalted popcorn and pretzels. Fat-free sweets. The items listed above may not be a complete list of foods and beverages you can eat. Contact a dietitian for more information. What foods should I avoid? Fruits Canned fruit in a light or heavy  syrup. Fried fruit. Fruit in cream or butter sauce. Vegetables Creamed or fried vegetables. Vegetables in a cheese sauce. Regular canned vegetables (not low-sodium or reduced-sodium). Regular canned tomato sauce and paste (not low-sodium or reduced-sodium). Regular tomato and vegetable juice (not low-sodium or reduced-sodium). Rosita Fire. Olives. Grains Baked goods made with fat, such as croissants, muffins, or some breads. Dry pasta or rice meal packs. Meats and other proteins Fatty cuts of meat. Ribs. Fried meat. Tomasa Blase. Bologna, salami, and other precooked or cured meats, such as sausages or meat loaves. Fat from the back of a pig (fatback). Bratwurst. Salted nuts and seeds. Canned beans with added salt. Canned or smoked fish. Whole eggs or egg yolks. Chicken or [place] with skin. Dairy Whole or 2% milk, cream, and half-and-half. Whole or full-fat cream cheese. Whole-fat or sweetened yogurt. Full-fat cheese. Nondairy creamers. Whipped toppings. Processed cheese and cheese spreads. Fats and oils Butter. Stick margarine. Lard. Shortening. Ghee. Bacon fat. Tropical oils, such as coconut, palm kernel, or palm oil. Seasonings and condiments Onion salt, garlic salt, seasoned salt, table salt, and sea salt. Worcestershire sauce. Tartar sauce. Barbecue sauce. Teriyaki sauce. Soy sauce, including reduced-sodium. Steak sauce. Canned and packaged gravies. Fish sauce. Oyster sauce. Cocktail sauce. Store-bought horseradish. Ketchup. Mustard. Meat flavorings and tenderizers. Bouillon cubes. Hot sauces.  Pre-made or packaged marinades. Pre-made or packaged taco seasonings. Relishes. Regular salad dressings. Other foods Salted popcorn and pretzels. The items listed above may not be a complete list of foods and beverages you should avoid. Contact a dietitian for more information. Where to find more information National Heart, Lung, and Blood Institute: PopSteam.is American Heart Association:  www.heart.org Academy of Nutrition and Dietetics: www.eatright.org National Kidney Foundation: www.kidney.org Summary The DASH eating plan is a healthy eating plan that has been shown to reduce high blood pressure (hypertension). It may also reduce your risk for type 2 diabetes, heart disease, and stroke. When on the DASH eating plan, aim to eat more fresh fruits and vegetables, whole grains, lean proteins, low-fat dairy, and heart-healthy fats. With the DASH eating plan, you should limit salt (sodium) intake to 2,300 mg a day. If you have hypertension, you may need to reduce your sodium intake to 1,500 mg a day. Work with your health care provider or dietitian to adjust your eating plan to your individual calorie needs. This information is not intended to replace advice given to you by your health care provider. Make sure you discuss any questions you have with your health care provider. Document Revised: 06/27/2019 Document Reviewed: 06/27/2019 Elsevier Patient Education  2022 [employer].

## 2021-10-05 NOTE — Assessment & Plan Note (Signed)
Pacemaker placed for this.  ?

## 2021-10-05 NOTE — Progress Notes (Signed)
? ? ?Chronic Care Management ?Pharmacy Assistant  ? ?Name: Mark [name]  MRN: [id number] DOB: 1955/10/05 ? ?Reason for Encounter: CCM Animator) ?  ?Medications: ?Outpatient Encounter Medications as of 10/05/2021  ?Medication Sig Note  ? Ascorbic Acid (VITAMIN C) 1000 MG tablet Take 1,000 mg by mouth daily.   ? aspirin 81 MG tablet Take 81 mg by mouth daily.   ? atorvastatin (LIPITOR) 20 MG tablet Take 1 tablet by mouth at bedtime.   ? cetirizine (ZYRTEC) 10 MG tablet Take 10 mg by mouth at bedtime.    ? Cholecalciferol (VITAMIN D) 2000 units CAPS Take 2,000 Units by mouth daily.    ? clindamycin (CLEOCIN) 150 MG capsule Take 150 mg by mouth as needed. Prior to dental appt   ? CRANBERRY PO Take 4,200 mg by mouth at bedtime.    ? cyclobenzaprine (FLEXERIL) 10 MG tablet TAKE ONE-HALF TO ONE TABLET BY MOUTH 2 TIMES DAILY AS NEEDED FOR MUSCLE SPASMS   ? dicyclomine (BENTYL) 20 MG tablet Take 20 mg by mouth 2 (two) times daily as needed for spasms.   ? diphenhydrAMINE (BENADRYL) 50 MG capsule Take 50 mg by mouth at bedtime.   ? ferrous sulfate 325 (65 FE) MG EC tablet Take 325 mg by mouth daily with breakfast.   ? FLUoxetine (PROZAC) 20 MG capsule Take 1 capsule (20 mg total) by mouth daily. (Patient taking differently: Take 20 mg by mouth at bedtime.)   ? FLUoxetine (PROZAC) 40 MG capsule Take 1 capsule (40 mg total) by mouth daily. (Patient taking differently: Take 40 mg by mouth in the morning.)   ? fluticasone (FLONASE) 50 MCG/ACT nasal spray Place 2 sprays into both nostrils daily.   ? furosemide (LASIX) 20 MG tablet Take 1 tablet (20 mg total) by mouth daily.   ? Garlic 1000 MG CAPS Take 1,000 mg by mouth daily.   ? losartan (COZAAR) 25 MG tablet Take 1 tablet (25 mg total) by mouth daily.   ? Multiple Vitamins-Minerals (CERTAVITE SENIOR/ANTIOXIDANT) TABS Take 1 tablet by mouth daily.   ? naloxone (NARCAN) nasal spray 4 mg/0.1 mL Place 1 spray into the nose as needed for up to 365 doses (for  opioid-induced respiratory depresssion). In case of emergency (overdose), spray once into each nostril. If no response within 3 minutes, repeat application and call 911.   ? naproxen (NAPROSYN) 500 MG tablet Take 1 tablet (500 mg total) by mouth daily.   ? Omega-3 1000 MG CAPS Take 1,000 mg by mouth at bedtime.   ? omeprazole (PRILOSEC) 20 MG capsule Take 1 capsule (20 mg total) by mouth daily as needed (GERD).   ? Oxycodone HCl 10 MG TABS Take 1 tablet (10 mg total) by mouth every 6 (six) hours as needed. Each refill must last 30 days.   ? [START ON 10/25/2021] Oxycodone HCl 10 MG TABS Take 1 tablet (10 mg total) by mouth every 6 (six) hours as needed. Each refill must last 30 days.   ? [START ON 11/24/2021] Oxycodone HCl 10 MG TABS Take 1 tablet (10 mg total) by mouth every 6 (six) hours as needed. Each refill must last 30 days. 09/21/2021: WARNING: Not a Duplicate. Future prescription. DO NOT DELETE during hospital medication reconciliation or at discharge. ?[facility] Patient   ? potassium citrate (UROCIT-K) 10 MEQ (1080 MG) SR tablet Take 1 tablet (10 mEq total) by mouth 2 (two) times daily.   ? rOPINIRole (REQUIP) 2 MG tablet Take  1 tablet (2 mg total) by mouth at bedtime.   ? Saw Palmetto, Serenoa repens, (SAW PALMETTO PO) Take 450 mg by mouth 2 (two) times daily.    ? VITAMIN B COMPLEX-C CAPS Take 1 capsule by mouth daily.   ? ?No facility-administered encounter medications on file as of 10/05/2021.  ? ?Mark [name] was contacted to remind of upcoming telephone visit with [name] on 10/07/2021 at 1:45 pm. Patient was reminded to have all medications, supplements and any blood glucose and blood pressure readings available for review at appointment. If unable to reach, a voicemail was left for patient.  ? ?Star Rating Drugs: ?Medication:  Last Fill: Day Supply ?Losartan 25 mg RX written 10/05/2021 - not filled at time of call ?Atorvastatin 20 mg 08/02/2021 90 ? ?[name], CPP  notified ? ?Claudina Lick, RMA ?Clinical Pharmacy Assistant ?[phone] ? ?Time Spent: 10 Minutes ? ? ? ? ?

## 2021-10-05 NOTE — Assessment & Plan Note (Signed)
S/p dual chamber pacemaker placement for bradycardia.  ?

## 2021-10-05 NOTE — Progress Notes (Signed)
Patient ID: Mark [name], male    DOB: [birth year], 66 y.o.   MRN: [id number]  This visit was conducted in person.  BP (!) 142/76 (BP Location: Right Arm, Cuff Size: Large)    Pulse 77    Temp (!) 97.5 F (36.4 C) (Temporal)    Ht 5\' 7"  (1.702 m)    Wt 225 lb 6 oz (102.2 kg)    SpO2 96%    BMI 35.30 kg/m    CC: HTN f/u visit  Subjective:   HPI: Mark [name] is a 66 y.o. male presenting on 10/05/2021 for Follow-up (Here for BP f/u.)   Tough year last year. Mother had CVA then moved in with him.  He had gallbladder surgery 10-27-2020.  Girlfriend passed away last year.   HTN - Compliant with current antihypertensive regimen of lasix 20mg  daily.  He takes this daily for pedal edema. Notes increased UOP when he takes lasix. Does check blood pressures at home and brings detailed log - am BP 150s/80s, lunch BP 130-160s/70-80s, dinner BP 140-150s/70-80s. No low blood pressure readings or symptoms of dizziness/syncope. Denies HA, vision changes, CP/tightness, SOB. Occasional leg swelling.   Last labs 10-27-20.   Sees Dr Ubaldo Glassing s/p 2nd pacemaker placement 12/2017 - placed for bradycardia down to 30s. Last saw cardiology 01/2021 with plan for yearly check.   OSA - not using CPAP machine - unable to tolerate the mask.   Sees urology Dr Bernardo Heater for h/o kidney stones, on potassium citrate 39mEq twice daily.      Relevant past medical, surgical, family and social history reviewed and updated as indicated. Interim medical history since our last visit reviewed. Allergies and medications reviewed and updated. Outpatient Medications Prior to Visit  Medication Sig Dispense Refill   Ascorbic Acid (VITAMIN C) 1000 MG tablet Take 1,000 mg by mouth daily.     aspirin 81 MG tablet Take 81 mg by mouth daily.     atorvastatin (LIPITOR) 20 MG tablet Take 1 tablet by mouth at bedtime.     cetirizine (ZYRTEC) 10 MG tablet Take 10 mg by mouth at bedtime.      Cholecalciferol (VITAMIN D) 2000 units CAPS Take 2,000  Units by mouth daily.      clindamycin (CLEOCIN) 150 MG capsule Take 150 mg by mouth as needed. Prior to dental appt     CRANBERRY PO Take 4,200 mg by mouth at bedtime.      cyclobenzaprine (FLEXERIL) 10 MG tablet TAKE ONE-HALF TO ONE TABLET BY MOUTH 2 TIMES DAILY AS NEEDED FOR MUSCLE SPASMS 40 tablet 0   dicyclomine (BENTYL) 20 MG tablet Take 20 mg by mouth 2 (two) times daily as needed for spasms.     diphenhydrAMINE (BENADRYL) 50 MG capsule Take 50 mg by mouth at bedtime.     ferrous sulfate 325 (65 FE) MG EC tablet Take 325 mg by mouth daily with breakfast.     FLUoxetine (PROZAC) 20 MG capsule Take 1 capsule (20 mg total) by mouth daily. (Patient taking differently: Take 20 mg by mouth at bedtime.) 90 capsule 4   FLUoxetine (PROZAC) 40 MG capsule Take 1 capsule (40 mg total) by mouth daily. (Patient taking differently: Take 40 mg by mouth in the morning.) 90 capsule 4   fluticasone (FLONASE) 50 MCG/ACT nasal spray Place 2 sprays into both nostrils daily. 48 g 4   furosemide (LASIX) 20 MG tablet Take 1 tablet (20 mg total) by mouth daily. 90 tablet 4  Garlic 123XX123 MG CAPS Take 1,000 mg by mouth daily.     Multiple Vitamins-Minerals (CERTAVITE SENIOR/ANTIOXIDANT) TABS Take 1 tablet by mouth daily.     naloxone (NARCAN) nasal spray 4 mg/0.1 mL Place 1 spray into the nose as needed for up to 365 doses (for opioid-induced respiratory depresssion). In case of emergency (overdose), spray once into each nostril. If no response within 3 minutes, repeat application and call [id number]. 1 each 0   Omega-3 1000 MG CAPS Take 1,000 mg by mouth at bedtime.     omeprazole (PRILOSEC) 20 MG capsule Take 1 capsule (20 mg total) by mouth daily as needed (GERD). 90 capsule 4   Oxycodone HCl 10 MG TABS Take 1 tablet (10 mg total) by mouth every 6 (six) hours as needed. Each refill must last 30 days. 120 tablet 0   [START ON 10/25/2021] Oxycodone HCl 10 MG TABS Take 1 tablet (10 mg total) by mouth every 6 (six) hours as  needed. Each refill must last 30 days. 120 tablet 0   [START ON 11/24/2021] Oxycodone HCl 10 MG TABS Take 1 tablet (10 mg total) by mouth every 6 (six) hours as needed. Each refill must last 30 days. 120 tablet 0   potassium citrate (UROCIT-K) 10 MEQ (1080 MG) SR tablet Take 1 tablet (10 mEq total) by mouth 2 (two) times daily. 180 tablet 3   rOPINIRole (REQUIP) 2 MG tablet Take 1 tablet (2 mg total) by mouth at bedtime. 90 tablet 3   Saw Palmetto, Serenoa repens, (SAW PALMETTO PO) Take 450 mg by mouth 2 (two) times daily.      VITAMIN B COMPLEX-C CAPS Take 1 capsule by mouth daily.     naproxen (NAPROSYN) 500 MG tablet TAKE 1 TABLET BY MOUTH 2 TIMES DAILY AS NEEDED FOR MODERATE PAIN (Patient taking differently: 500 mg daily.) 90 tablet 0   vitamin E 1000 UNIT capsule Take 1,000 Units by mouth daily.      naproxen (NAPROSYN) 500 MG tablet Take 1 tablet (500 mg total) by mouth daily.     No facility-administered medications prior to visit.     Per HPI unless specifically indicated in ROS section below Review of Systems  Objective:  BP (!) 142/76 (BP Location: Right Arm, Cuff Size: Large)    Pulse 77    Temp (!) 97.5 F (36.4 C) (Temporal)    Ht 5\' 7"  (1.702 m)    Wt 225 lb 6 oz (102.2 kg)    SpO2 96%    BMI 35.30 kg/m   Wt Readings from Last 3 Encounters:  10/05/21 225 lb 6 oz (102.2 kg)  09/21/21 225 lb (102.1 kg)  09/12/21 225 lb (102.1 kg)      Physical Exam Vitals and nursing note reviewed.  Constitutional:      Appearance: Normal appearance. He is not ill-appearing.  Eyes:     Extraocular Movements: Extraocular movements intact.     Pupils: Pupils are equal, round, and reactive to light.  Neck:     Thyroid: No thyroid mass or thyromegaly.  Cardiovascular:     Rate and Rhythm: Normal rate and regular rhythm.     Pulses: Normal pulses.     Heart sounds: Normal heart sounds. No murmur heard. Pulmonary:     Effort: Pulmonary effort is normal. No respiratory distress.      Breath sounds: Normal breath sounds. No wheezing, rhonchi or rales.  Musculoskeletal:     Cervical back: Normal range of motion and  neck supple. No rigidity.     Right lower leg: Edema (tr) present.     Left lower leg: Edema (tr) present.  Lymphadenopathy:     Cervical: No cervical adenopathy.  Skin:    General: Skin is warm and dry.     Findings: No rash.  Neurological:     Mental Status: He is alert.  Psychiatric:        Mood and Affect: Mood normal.        Behavior: Behavior normal.      Lab Results  Component Value Date   CREATININE 0.89 10/29/2020   BUN 13 10/29/2020   NA 137 10/29/2020   K 3.7 10/29/2020   CL 105 10/29/2020   CO2 22 10/29/2020    Lab Results  Component Value Date   ALT 163 (H) 10/29/2020   AST 137 (H) 10/29/2020   ALKPHOS 170 (H) 10/29/2020   BILITOT 2.6 (H) 10/29/2020     Assessment & Plan:  This visit occurred during the SARS-CoV-2 public health emergency.  Safety protocols were in place, including screening questions prior to the visit, additional usage of staff PPE, and extensive cleaning of exam room while observing appropriate contact time as indicated for disinfecting solutions.   Problem List Items Addressed This Visit     Chronic pain syndrome (Chronic)   Relevant Medications   naproxen (NAPROSYN) 500 MG tablet   History of permanent cardiac pacemaker placement (Chronic)    S/p dual chamber pacemaker placement for bradycardia.       Hypertension - Primary    BP above goal based on meticulous BP log he brings.  This is despite daily lasix 20mg .  Will start losartan 25mg  daily, discussed need to avoid dehydration on this medication.  Chosen for possible uricosuric effect.       Relevant Medications   losartan (COZAAR) 25 MG tablet   Other Relevant Orders   Comprehensive metabolic panel   Basic metabolic panel   Nephrolithiasis    Sees urology, now on Urocit-K (potassium citrate) 69mEq BID with benefit.       Bradycardia     Pacemaker placed for this.       OSA (obstructive sleep apnea)    He is off BiPAP - states he was unable to use mask.  Discussed how uncontrolled sleep apnea can contribute to hypertension.         Meds ordered this encounter  Medications   losartan (COZAAR) 25 MG tablet    Sig: Take 1 tablet (25 mg total) by mouth daily.    Dispense:  30 tablet    Refill:  6   Orders Placed This Encounter  Procedures   Comprehensive metabolic panel   Basic metabolic panel    Standing Status:   Future    Standing Expiration Date:   04/07/2022     Patient instructions: Labs today  Start losartan 25mg  daily.  Continue other medicines Schedule lab visit in 10 days to recheck potassium and kidneys.  Good to see you today. Return for physical as scheduled.  Continue monitoring blood pressures, let us know if consistently above goal.   Your goal blood pressure is <140/80, ideally lower. Work on low salt/sodium diet - goal <1.5gm (1,500mg ) per day. Eat a diet high in fruits/vegetables and whole grains.  Look into mediterranean and DASH diet. Goal activity is 141min/wk of moderate intensity exercise.  This can be split into 30 minute chunks.  If you are not at this level, you  can start with smaller 10-15 min increments and slowly build up activity. Look at [place].org for more resources   Follow up plan: Return if symptoms worsen or fail to improve.  Ria Bush, MD

## 2021-10-05 NOTE — Assessment & Plan Note (Addendum)
He is off BiPAP - states he was unable to use mask.  ?Discussed how uncontrolled sleep apnea can contribute to hypertension.  ?

## 2021-10-05 NOTE — Assessment & Plan Note (Signed)
BP above goal based on meticulous BP log he brings.  ?This is despite daily lasix 20mg .  ?Will start losartan 25mg  daily, discussed need to avoid dehydration on this medication.  ?Chosen for possible uricosuric effect.  ?

## 2021-10-06 LAB — COMPREHENSIVE METABOLIC PANEL
ALT: 28 U/L (ref 0–53)
AST: 24 U/L (ref 0–37)
Albumin: 4.1 g/dL (ref 3.5–5.2)
Alkaline Phosphatase: 67 U/L (ref 39–117)
BUN: 27 mg/dL — ABNORMAL HIGH (ref 6–23)
CO2: 30 mEq/L (ref 19–32)
Calcium: 9.3 mg/dL (ref 8.4–10.5)
Chloride: 101 mEq/L (ref 96–112)
Creatinine, Ser: 1.06 mg/dL (ref 0.40–1.50)
GFR: 74.04 mL/min (ref >60.00)
Glucose, Bld: 148 mg/dL — ABNORMAL HIGH (ref 70–99)
Potassium: 4.3 mEq/L (ref 3.5–5.1)
Sodium: 138 mEq/L (ref 135–145)
Total Bilirubin: 0.7 mg/dL (ref 0.2–1.2)
Total Protein: 6.5 g/dL (ref 6.0–8.3)

## 2021-10-07 ENCOUNTER — Telehealth: Payer: Medicare Other

## 2021-10-07 NOTE — Progress Notes (Signed)
Due to patient having an appointment with Dr. [name] on 10/05/2021 with medication changes the CCM appointment originally scheduled for 10/07/2021 was rescheduled for 11/14/2021 at 1:45 pm. Patient was notified and agreed. ? ?[name], CPP notified ? ?Claudina Lick, RMA ?Clinical Pharmacy Assistant ?[phone] ? ?Time Spent: 10 Minutes ? ?

## 2021-10-07 NOTE — Progress Notes (Unsigned)
Chronic Care Management Pharmacy Note  10/07/2021 Name:  Mark [name] MRN:  [id number] DOB:  1955/08/15  Summary: -Home BP range 126/73-160/84 (Avg 141/80). BP goal with diastolic HF is < 335/45. He is only taking furosemide. He will resume chronic daily naproxen, would avoid ACE/ARB due to AKI risk. Pt is amenable to starting BP medication but is due for PCP visit (last OV 09/14/20, last labwork 10/29/20) -Pt has been off naproxen for ~4 weeks due to BP risks and reports joint pain is significantly worse. Would avoid Tylenol until we can can confirm LFTs have normalized after cholecystectomy last year  Recommendations/Changes made from today's visit: -Recommend CCB or thiazide  - however pt needs PCP visit/labwork prior to starting BP medication. Coordinating with office staff to schedule OV - see telephone note.  Plan: -Pharmacist follow up televisit scheduled for 1 month (BP check)   Subjective: Mark [name] is an 66 y.o. year old male who is a primary patient of Ria Bush, MD.  The CCM team was consulted for assistance with disease management and care coordination needs.    Engaged with patient by telephone for follow up visit in response to provider referral for pharmacy case management and/or care coordination services.   Consent to Services:  The patient was given information about Chronic Care Management services, agreed to services, and gave verbal consent prior to initiation of services.  Please see initial visit note for detailed documentation.   Patient Care Team: Ria Bush, MD as PCP - General (Family Medicine) Hyman Hopes, [place], [place] as Referring Physician (Dentistry) Milinda Pointer, MD as Referring Physician (Pain Medicine) Teodoro Spray, MD as Consulting Physician (Cardiology) Laverle Hobby, MD as Consulting Physician (Pulmonary Disease) Abbie Sons, MD (Urology) Leonie Green, OD as Referring Physician (Optometry) Charlton Haws, [facility] as Pharmacist (Pharmacist)  Patient lives alone, his mom lives next door and cooks for him occasionally. She had a stroke last year and he helps with her medications as well.  Recent office visits: 10/05/21 Dr Danise Mina OV: f/u HTN; started losartan 25 mg daily. Recheck BMP 10 days.  09/14/20 Dr Danise Mina OV: annual exam; referred to GI for Cologard  Recent consult visits: 06/22/21-Pain Management- Francisco Naveira,MD-Patient presented for left shoulder pain,pain medication management. 05/25/21-Cardiology-Kenneth Fath,MD-Patient presented for pacemaker evaluation. Normal function, no medication changes  03/23/21-Pain Management-Francisco Naveira,MD-Patient presented for follow up pain medication management.  Hospital visits: None in previous 6 months   Objective:  Lab Results  Component Value Date   CREATININE 1.06 10/05/2021   BUN 27 (H) 10/05/2021   GFR 74.04 10/05/2021   GFRNONAA >60 10/29/2020   GFRAA >60 12/06/2017   NA 138 10/05/2021   K 4.3 10/05/2021   CALCIUM 9.3 10/05/2021   CO2 30 10/05/2021   GLUCOSE 148 (H) 10/05/2021    Lab Results  Component Value Date/Time   HGBA1C 5.6 09/07/2020 01:16 PM   HGBA1C 6.2 08/13/2019 07:50 AM   GFR 74.04 10/05/2021 02:45 PM   GFR 91.41 09/07/2020 01:16 PM   MICROALBUR 12.9 (H) 04/28/2014 10:35 AM   MICROALBUR 1.1 04/22/2013 10:04 AM    Last diabetic Eye exam: No results found for: HMDIABEYEEXA  Last diabetic Foot exam: No results found for: HMDIABFOOTEX   Lab Results  Component Value Date   CHOL 109 09/07/2020   HDL 40.00 09/07/2020   LDLCALC 46 09/07/2020   LDLDIRECT 128.0 05/07/2015   TRIG 118.0 09/07/2020   CHOLHDL 3 09/07/2020    Hepatic Function  Latest Ref Rng & Units 10/05/2021 10/29/2020 10/27/2020  Total Protein 6.0 - 8.3 g/dL 6.5 5.8(L) 5.4(L)  Albumin 3.5 - 5.2 g/dL 4.1 2.6(L) 2.6(L)  AST 0 - 37 U/L 24 137(H) 137(H)  ALT 0 - 53 U/L 28 163(H) 157(H)  Alk Phosphatase 39 - 117 U/L 67 170(H) 189(H)  Total  Bilirubin 0.2 - 1.2 mg/dL 0.7 2.6(H) 5.3(H)    Lab Results  Component Value Date/Time   TSH 2.05 08/09/2018 08:22 AM   TSH 3.99 02/13/2017 10:17 AM    CBC Latest Ref Rng & Units 10/29/2020 10/28/2020 10/26/2020  WBC 4.0 - 10.5 K/uL 14.1(H) 15.7(H) 9.2  Hemoglobin 13.0 - 17.0 g/dL 14.8 14.1 14.1  Hematocrit 39.0 - 52.0 % 43.7 40.4 38.7(L)  Platelets 150 - 400 K/uL 151 163 135(L)    No results found for: VD25OH  Clinical ASCVD: No  The ASCVD Risk score (Arnett DK, et al., 2019) failed to calculate for the following reasons:   The valid total cholesterol range is 130 to 320 mg/dL    Depression screen [facility] 2/9 09/08/2020 08/27/2020 03/11/2020  Decreased Interest 0 0 0  Down, Depressed, Hopeless 0 0 0  PHQ - 2 Score 0 0 0  Altered sleeping - 0 -  Tired, decreased energy - 0 -  Change in appetite - 0 -  Feeling bad or failure about yourself  - 0 -  Trouble concentrating - 0 -  Moving slowly or fidgety/restless - 0 -  Suicidal thoughts - 0 -  PHQ-9 Score - 0 -  Difficult doing work/chores - Not difficult at all -  Some recent data might be hidden    GAD 7 : Generalized Anxiety Score 02/11/2018  Nervous, Anxious, on Edge 1  Control/stop worrying 0  Worry too much - different things 1  Trouble relaxing 1  Restless 0  Easily annoyed or irritable 0  Afraid - awful might happen 0  Total GAD 7 Score 3      Social History   Tobacco Use  Smoking Status Never  Smokeless Tobacco Never   BP Readings from Last 3 Encounters:  10/05/21 (!) 142/76  09/21/21 (!) 148/86  09/12/21 138/74   Pulse Readings from Last 3 Encounters:  10/05/21 77  09/21/21 71  09/12/21 68   Wt Readings from Last 3 Encounters:  10/05/21 225 lb 6 oz (102.2 kg)  09/21/21 225 lb (102.1 kg)  09/12/21 225 lb (102.1 kg)   BMI Readings from Last 3 Encounters:  10/05/21 35.30 kg/m  09/21/21 35.24 kg/m  09/12/21 35.24 kg/m    Assessment/Interventions: Review of patient past medical history, allergies,  medications, health status, including review of consultants reports, laboratory and other test data, was performed as part of comprehensive evaluation and provision of chronic care management services.   SDOH:  (Social Determinants of Health) assessments and interventions performed: Yes  SDOH Screenings   Alcohol Screen: Not on file  Depression (PHQ2-9): Not on file  Financial Resource Strain: Not on file  Food Insecurity: Not on file  Housing: Not on file  Physical Activity: Not on file  Social Connections: Not on file  Stress: Not on file  Tobacco Use: Low Risk    Smoking Tobacco Use: Never   Smokeless Tobacco Use: Never   Passive Exposure: Not on file  Transportation Needs: Not on file    CCM Care Plan  Allergies  Allergen Reactions   Ciprofloxacin Other (See Comments)    PANIC ATTACKS (Fluoroquinolones)  Metformin Other (See Comments)    Bad acid reflux   Buprenorphine Hcl Nausea Only   Morphine And Related Nausea Only   Penicillins Rash    Rash Has patient had a PCN reaction causing immediate rash, facial/tongue/throat swelling, SOB or lightheadedness with hypotension: Unknown Has patient had a PCN reaction causing severe rash involving mucus membranes or skin necrosis: Unknown Has patient had a PCN reaction that required hospitalization: No Has patient had a PCN reaction occurring within the last 10 years: No CHILDHOOD REACTION. If all of the above answers are "NO", then may proceed with Cephalosporin use.     Medications Reviewed Today     Reviewed by Ria Bush, MD (Physician) on 10/05/21 at 1405  Med List Status: <None>   Medication Order Taking? Sig Documenting Provider Last Dose Status Informant  Ascorbic Acid (VITAMIN C) 1000 MG tablet [id number] Yes Take 1,000 mg by mouth daily. [provider] Taking Active Self  aspirin 81 MG tablet [id number] Yes Take 81 mg by mouth daily. [provider] Taking Active Self  atorvastatin  (LIPITOR) 20 MG tablet [id number] Yes Take 1 tablet by mouth at bedtime. [provider] Taking Active   cetirizine (ZYRTEC) 10 MG tablet [id number] Yes Take 10 mg by mouth at bedtime.  [provider] Taking Active Self  Cholecalciferol (VITAMIN D) 2000 units CAPS [id number] Yes Take 2,000 Units by mouth daily.  [provider] Taking Active Self  clindamycin (CLEOCIN) 150 MG capsule [id number] Yes Take 150 mg by mouth as needed. Prior to dental appt [provider] Taking Active   CRANBERRY PO [id number] Yes Take 4,200 mg by mouth at bedtime.  [provider] Taking Active Self           Med Note Maylene Roes Jun 21, 2016 10:22 AM)    cyclobenzaprine (FLEXERIL) 10 MG tablet [id number] Yes TAKE ONE-HALF TO ONE TABLET BY MOUTH 2 TIMES DAILY AS NEEDED FOR MUSCLE SPASMS Ria Bush, MD Taking Active   dicyclomine (BENTYL) 20 MG tablet [id number] Yes Take 20 mg by mouth 2 (two) times daily as needed for spasms. [provider] Taking Active Self  diphenhydrAMINE (BENADRYL) 50 MG capsule [id number] Yes Take 50 mg by mouth at bedtime. [provider] Taking Active Self  ferrous sulfate 325 (65 FE) MG EC tablet [id number] Yes Take 325 mg by mouth daily with breakfast. [provider] Taking Active Self  FLUoxetine (PROZAC) 20 MG capsule [id number] Yes Take 1 capsule (20 mg total) by mouth daily.  Patient taking differently: Take 20 mg by mouth at bedtime.   Ria Bush, MD Taking Active   FLUoxetine [facility]) 40 MG capsule [id number] Yes Take 1 capsule (40 mg total) by mouth daily.  Patient taking differently: Take 40 mg by mouth in the morning.   Ria Bush, MD Taking Active   fluticasone [facility]) 50 MCG/ACT nasal spray [id number] Yes Place 2 sprays into both nostrils daily. Ria Bush, MD Taking Active Self  furosemide (LASIX) 20 MG tablet [id number] Yes Take 1 tablet (20 mg total) by mouth daily.  Ria Bush, MD Taking Active Self  Garlic 6734 MG CAPS [id number] Yes Take 1,000 mg by mouth daily. [provider] Taking Active Self  Multiple Vitamins-Minerals (CERTAVITE SENIOR/ANTIOXIDANT) TABS [id number] Yes Take 1 tablet by mouth daily. [provider] Taking Active Self  naloxone [facility]) nasal spray 4 mg/0.1 mL [id number] Yes Place 1 spray into the nose as needed for up to 365 doses (  for opioid-induced respiratory depresssion). In case of emergency (overdose), spray once into each nostril. If no response within 3 minutes, repeat application and call [phone]. Milinda Pointer, MD Taking Active   naproxen (NAPROSYN) 500 MG tablet [id number] Yes TAKE 1 TABLET BY MOUTH 2 TIMES DAILY AS NEEDED FOR MODERATE PAIN  Patient taking differently: 500 mg daily.   Ria Bush, MD Taking Active   Omega-3 1000 MG CAPS [id number] Yes Take 1,000 mg by mouth at bedtime. [provider] Taking Active Self  omeprazole (PRILOSEC) 20 MG capsule [id number] Yes Take 1 capsule (20 mg total) by mouth daily as needed (GERD). Ria Bush, MD Taking Active Self  Oxycodone HCl 10 MG TABS [id number] Yes Take 1 tablet (10 mg total) by mouth every 6 (six) hours as needed. Each refill must last 30 days. Milinda Pointer, MD Taking Active   Oxycodone HCl 10 MG TABS [id number] Yes Take 1 tablet (10 mg total) by mouth every 6 (six) hours as needed. Each refill must last 30 days. Milinda Pointer, MD Taking Active   Oxycodone HCl 10 MG TABS [id number] Yes Take 1 tablet (10 mg total) by mouth every 6 (six) hours as needed. Each refill must last 30 days. Milinda Pointer, MD Taking Active            Med Note Dossie Arbour, FRANCISCO A   Wed Sep 21, 2021 10:12 AM) WARNING: Not a Duplicate. Future prescription. DO NOT DELETE during hospital medication reconciliation or at discharge. [facility] Chronic Pain Management Patient   potassium citrate (UROCIT-K) 10 MEQ (1080 MG) SR tablet [id number] Yes Take 1  tablet (10 mEq total) by mouth 2 (two) times daily. Abbie Sons, MD Taking Active   rOPINIRole (REQUIP) 2 MG tablet [id number] Yes Take 1 tablet (2 mg total) by mouth at bedtime. Ria Bush, MD Taking Active   Saw Palmetto, Serenoa repens, (SAW PALMETTO PO) [id number] Yes Take 450 mg by mouth 2 (two) times daily.  [provider] Taking Active Self  VITAMIN B COMPLEX-C CAPS [id number] Yes Take 1 capsule by mouth daily. [provider] Taking Active Self           Med Note Maylene Roes Jun 21, 2016 10:25 AM)    vitamin E 1000 UNIT capsule [id number] Yes Take 1,000 Units by mouth daily.  [provider] Taking Active Self  Med List Note Dewayne Shorter, RN 09/21/21 1018): MR 12-24-2021 UDS 12-08-2020             Patient Active Problem List   Diagnosis Date Noted   DDD (degenerative disc disease), cervical 03/16/2021   Chronic low back pain (Bilateral) w/o sciatica 03/16/2021   Chronic shoulder pain (Bilateral) (L>R) 03/16/2021   History of laparoscopic cholecystectomy (10/27/2020) 12/08/2020   Chronic diastolic heart failure ([place]) 10/22/2020   Prediabetes 10/22/2020   Serum lipase elevation 10/22/2020   Transaminasemia 23/95/3202   Uncomplicated opioid dependence ([place]) 09/08/2020   Trigger point with back pain (Left) 03/30/2020   Trigger point of shoulder region (scapula) (Left) 03/30/2020   Osteoarthritis of first carpometacarpal joint (thumb) of hand (Left) 03/11/2020   Chronic thumb pain (Left) 03/03/2020   Trigger middle finger of right hand 03/03/2020   Chronic hand pain (Left) 03/03/2020   Pharmacologic therapy 12/02/2019   Disorder of skeletal system 12/02/2019   Problems influencing health status 12/02/2019   Cervicalgia 02/12/2019   Trigger finger of thumb (Right) 02/12/2019   GERD (gastroesophageal reflux disease) 09/24/2018   IDA (  iron deficiency anemia) 09/24/2018   Erectile dysfunction due to arterial insufficiency  09/12/2018   Trigger finger of index finger (Right) 08/16/2018   RLS (restless legs syndrome) 02/11/2018   Immunization deficiency 02/11/2018   Positive colorectal cancer screening using Cologuard test 09/21/2017   Low testosterone in male 02/18/2017   Chronic fatigue 02/08/2017   Advanced care planning/counseling discussion 08/11/2016   Chronic cervical radicular pain 06/21/2016   Long term current use of opiate analgesic 05/15/2016   OSA (obstructive sleep apnea) 04/11/2016   Hypocitraturia 03/16/2016   Cervical foraminal stenosis (Severe C6-7) (Left) 02/02/2016   Chronic sacroiliac joint pain (Bilateral) 02/02/2016   Osteoarthritis of sacroiliac joint (Bilateral) 02/02/2016   Lumbar facet hypertrophy (L1-2, L2-3, and L4-5) (Bilateral) 02/02/2016   Lumbar IVDD (intervertebral disc displacement) 02/02/2016   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) 02/02/2016   Lumbar facet syndrome (Bilateral) 02/02/2016   Osteoarthritis of shoulder (Left) 02/02/2016   Osteoarthritis of knee (Bilateral) (R>L) 02/02/2016   Radicular pain of shoulder (Bilateral) (L>R) 09/01/2015   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) 09/01/2015    Class: History of   Cervicogenic headache 09/01/2015   Cervical spondylosis with radiculopathy (Bilateral) (L>R) 09/01/2015   Cervical facet syndrome (Bilateral) (L>R) 09/01/2015   Opiate use (60 MME/Day) 05/31/2015   Encounter for therapeutic drug level monitoring 05/31/2015   Chronic neck pain (1ry area of Pain) (Bilateral) (L>R) 05/31/2015   Chronic knee pain (Bilateral) (R>L) 05/31/2015   History of permanent cardiac pacemaker placement 05/31/2015   Opioid-induced constipation (OIC) 05/31/2015   Health maintenance examination 05/07/2015   Candidal dermatitis 05/07/2015   Hypertension 02/16/2014   Bradycardia 02/16/2014   Cardiac murmur 02/16/2014   Severe obesity (BMI 35.0-39.9) with comorbidity ([place]) 04/22/2013   Benign prostatic  hyperplasia with urinary obstruction 05/28/2012   Nephrolithiasis 05/28/2012   Medicare annual wellness visit, subsequent 03/27/2012   HLD (hyperlipidemia) 03/27/2012   GAD (generalized anxiety disorder) 12/25/2011   Insomnia 09/27/2011   Chronic pain syndrome 03/28/2011   MDD (major depressive disorder), recurrent episode, moderate ([place]) 03/28/2011    Immunization History  Administered Date(s) Administered   Influenza, High Dose Seasonal PF 05/04/2021   Influenza,inj,Quad PF,6+ Mos 04/22/2013, 04/28/2014, 05/07/2015, 04/11/2016, 05/22/2017, 05/16/2018   Influenza-Unspecified 06/16/2019, 04/15/2020   Moderna Covid-19 Vaccine Bivalent Booster 79yr & up 05/05/2021   Moderna Sars-Covid-2 Vaccination 11/06/2019, 12/04/2019, 07/07/2020   Pneumococcal Polysaccharide-23 05/30/2006   Tdap 11/19/2009, 04/15/2020    Conditions to be addressed/monitored:  Hypertension, Hyperlipidemia, Heart Failure, Depression, and Allergic Rhinitis, RLS  There are no care plans that you recently modified to display for this patient.     Medication Assistance: None required.  Patient affirms current coverage meets needs.  Compliance/Adherence/Medication fill history: Care Gaps: Cologuard (due 09/21/20) - planning to set up colonoscopy in 2023 AWV due  Star-Rating Drugs: Atorvastatin 20 mg - LF 05/16/21 x 90 ds; [place]%  [place] [facility]  [place] [place]Phone: [phone]: [phone] [facility]Phone: [phone]: [phone]  Uses pill box? Yes Pt endorses 100% compliance  We discussed: Current pharmacy is preferred with insurance plan and patient is satisfied with pharmacy services Patient decided to: Continue current medication management strategy  Care Plan  and Follow Up Patient Decision:  Patient  agrees to Care Plan and Follow-up.  Plan: Telephone follow up appointment with care management team member scheduled for:  1 month  Charlene Brooke, PharmD, BCACP Clinical Pharmacist [facility] [phone]   Current Barriers:  Unable to independently monitor therapeutic efficacy  Pharmacist Clinical Goal(s):  Patient will achieve adherence to monitoring guidelines and medication adherence to achieve therapeutic efficacy through collaboration with PharmD and provider.   Interventions: 1:1 collaboration with Ria Bush, MD regarding development and update of comprehensive plan of care as evidenced by provider attestation and co-signature Inter-disciplinary care team collaboration (see longitudinal plan of care) Comprehensive medication review performed; medication list updated in electronic medical record  Hyperlipidemia: (LDL goal < 100) -Controlled - baseline LDL 130, currently 46; pt endorses compliance with statin -Current treatment: Atorvastatin 20 mg daily HS Omega-3 1000 mg Aspirin 81 mg daily -Educated on Cholesterol goals; Benefits of statin for ASCVD risk reduction; -Recommended to continue current medication  Heart Failure (BP Goal < 130/80, manage symptoms and prevent exacerbations) -Not ideally controlled - BP readings are above goal at home -Hx heart block s/p dual chamber pacemaker -Last ejection fraction: 55-65% (Date: 10/2017) -HF type: Diastolic -Home BP readings: checking AM and HS 08/15/21: 135/83  08/14/21: 147/80, 150/75 08/13/21: 141/87, 142/75 08/12/21: 138/85, 147/84 08/11/21: 124/80, 142/82 08/10/21: 126/73, 159/84 08/09/21: 129/77, 151/87 08/08/21: 157/77, 124/68 08/07/21: 140/82, 160/84 08/06/21: 136/85, 139/76 08/05/21: 133/83, 147/84 -Current treatment: Furosemide 20 mg daily -Educated on BP goals and benefits of medications for prevention of heart attack, stroke and kidney damage;  Daily salt intake goal < 2300 mg; Importance of home blood pressure monitoring; Proper BP monitoring technique; -Counseled to monitor BP at home daily -Recommended starting BP medication however pt is overdue for labs/PCP visit (last OV 09/14/20, last labwork 10/29/20 following cholesystectomy) - caution ACE/ARB with chronic naproxen use and risk for AKI; may opt for thiazide diuretic or amlodipine first.  -Advised PCP f/u to address the above - pt states he will call next week. Also forwarding message to PCP/assistant to make sure appt is scheduled.  Depression/Anxiety (Goal: manage symptoms) -Controlled - pt has been on fluoxetine since at least 2013; he reports is is working "well enough" currently -Current treatment: Fluoxetine 20 mg daily HS Fluoxetine 40 mg daily AM -PHQ9: 0 (09/2020) -GAD7: 3 (02/2018) -Connected with PCP for mental health support -Educated on Benefits of medication for symptom control -Recommended to continue current medication  Chronic pain (Goal: manage pain) -Not ideally controlled - per pt report. He stopped naproxen last month due to BP risks and reports pain is significantly worse -cervical radiculopathy; lumbar disc displacement, OA of knee, shoulder -Follows with Dr Dossie Arbour (pain mgmt) -Current treatment  Oxycodone 10 m q6h (#120/month): 2-3 times per day Naproxen 500 mg BID prn - takes once a day Cyclobenzaprine 10 mg BID prn Narcan PRN -Educated on risks of chronic NSAID use including elevated BP and kidney damage; discussed naproxen could be contributing to high BP at home; would avoid Tylenol due to recently elevated LFTs (although after cholesystectomy these should normalize - need repeat labs to be sure) -Advised he could restart naproxen once daily - benefits outweigh risks at this point  Renal stones (Goal: prevent recurrence) -Controlled  - pt follows with urology; he endorses compliance with potassium citrate -Current treatment  Potassium citrate  10 mEq BID (renal stones) -Discussed role of potassium citrate in controlling/preventing renal stones -Recommended to continue current medication  Health Maintenance -Vaccine gaps: Flu, Covid  booster, Prevnar, Shingrix -Updated flu and covid vaccines in record; advised pt get Prevnar vaccine at next office visit  Patient Goals/Self-Care Activities Patient will:  - take medications as prescribed as evidenced by patient report and record review focus on medication adherence by pill box check blood pressure daily, document, and provide at future appointments -Schedule PCP appt ASAP to address BP

## 2021-10-19 ENCOUNTER — Telehealth: Payer: Self-pay

## 2021-10-19 MED ORDER — OMEPRAZOLE 20 MG PO CPDR: 20.0000 mg | DELAYED_RELEASE_CAPSULE | Freq: Every day | ORAL | 0 refills | Status: DC | PRN

## 2021-10-19 NOTE — Telephone Encounter (Signed)
E-scribed refill 

## 2021-10-25 ENCOUNTER — Other Ambulatory Visit: Payer: Medicare Other

## 2021-10-26 ENCOUNTER — Telehealth: Payer: Self-pay

## 2021-10-26 DIAGNOSIS — I10 Essential (primary) hypertension: Secondary | ICD-10-CM

## 2021-10-26 NOTE — Telephone Encounter (Signed)
Patient called was started on losartan about a month ago. About 3 days later he started having muscle pains, trouble sleeping, Nausea and feeling anxious. He states symptoms have increased over time. Denies any swelling of mouth, tongue or face. Denies any SOB or issues breathing.  ? ?Bp 155/95 ?O2 96 ?P 68 ?Tem 96.5  ? ?

## 2021-10-27 ENCOUNTER — Encounter: Payer: Self-pay | Admitting: Family Medicine

## 2021-10-27 ENCOUNTER — Telehealth (INDEPENDENT_AMBULATORY_CARE_PROVIDER_SITE_OTHER): Payer: Medicare Other | Admitting: Family Medicine

## 2021-10-27 ENCOUNTER — Other Ambulatory Visit: Payer: Self-pay

## 2021-10-27 VITALS — BP 147/86 | HR 83 | Temp 97.7°F | Ht 67.0 in

## 2021-10-27 DIAGNOSIS — M5412 Radiculopathy, cervical region: Secondary | ICD-10-CM

## 2021-10-27 MED ORDER — ATORVASTATIN CALCIUM 20 MG PO TABS: 20.0000 mg | ORAL_TABLET | Freq: Every day | ORAL | 0 refills | Status: DC

## 2021-10-27 MED ORDER — PREDNISONE 20 MG PO TABS: ORAL_TABLET | ORAL | 0 refills | Status: DC

## 2021-10-27 MED ORDER — ROPINIROLE HCL 2 MG PO TABS: 2.0000 mg | ORAL_TABLET | Freq: Every day | ORAL | 3 refills | Status: DC

## 2021-10-27 MED ORDER — FLUTICASONE PROPIONATE 50 MCG/ACT NA SUSP: 2.0000 | Freq: Every day | NASAL | 4 refills | Status: DC

## 2021-10-27 MED ORDER — FUROSEMIDE 20 MG PO TABS: 20.0000 mg | ORAL_TABLET | Freq: Every day | ORAL | 4 refills | Status: DC

## 2021-10-27 NOTE — Progress Notes (Signed)
? ? ? ? ?Priscilla Finklea T. Leslee Haueter, MD ?Primary Care and Sports Medicine ?Nature conservation officer at [facility] ?[address] [place] ?[place], [address] ?Phone: [phone]  FAX: [phone] ? ?Mark [name] - 66 y.o. male  MRN [id number]  Date of Birth: May 20, 1956 ? ?Visit Date: 10/27/2021  PCP: [name], MD  Referred by: [name], MD ? ?Virtual Visit via Video Note: ? ?I connected with  Mark [name] on 10/27/2021  3:20 PM EDT by a video enabled telemedicine application and verified that I am speaking with the correct person using two identifiers. ?  ?Location patient: home computer, tablet, or smartphone ?Location provider: work or home office ?Consent: Verbal consent directly obtained from Mark [name]. ?Persons participating in the virtual visit: patient, provider ? ?I discussed the limitations of evaluation and management by telemedicine and the availability of in person appointments. The patient expressed understanding and agreed to proceed. ? ?Chief Complaint  ?Patient presents with  ? Rash  ?  Shingles-from neck, down back and left arm  ? ? ?History of Present Illness: ? ?He is a very nice gentleman, who is here for discussion today regarding some neck pain as well as pain down his left arm.  He also has some pain down his back.  He was concerned about possible rash. ? ?On chart review, it does look like he has some severe cervical foraminal stenosis, spondylosis, chronic neck pain as well as some chronic shoulder pain. ? ?He sees pain management. ? ?He started to have some symptoms on October 10, 2021.  He does have a sensation where he has some pain on the left side of his upper extremity. ? ?He has also noticed that he has cold and clammy skin, chills, and upset stomach, and occasional subjective fever. ? ?He is concerned that he could have herpes zoster.  Right now, he does not have a vesicular rash, his mother thought that he may have a rash up and around the nape of his neck on the  left.  There is also some concern that he had some rash going down lower along the thoracic spine. ? ?He is here today through video visit, without face-to-face interaction. ? ?Review of Systems as above: See pertinent positives and pertinent negatives per HPI ?No acute distress verbally ?  ?Observations/Objective/Exam: ? ?An attempt was made to discern vital signs over the phone and per patient if applicable and possible.  ? ?General: ?   Alert, Oriented, appears well and in no acute distress ? ?Pulmonary:  ?   On inspection no signs of respiratory distress. ? ?Psych / Neurological:  ?   Pleasant and cooperative. ? ?Assessment and Plan: ? ?  ICD-10-CM   ?1. Left cervical radiculopathy  M54.12   ?  ? ?This is not a very good history for shingles.  At this point, there is no vesicular rash.  He has had symptoms since March 6, and he has what sounds to be radiating pain down his left arm. ? ?In the setting of chronic neck pain, and he also has a history of C5-6 solid body in her fusion. ? ?On review of his CT myelogram from 2015, patient does have severe left-sided C6-7 neural foraminal narrowing. ? ?I think that 1 can assume that this is radiating pain and radiculopathy from the cervical spine. ? ?I am getting give the patient some prednisone. ? ?He already has some chronic narcotics that he takes.  He also has long-term follow-up with  pain. ? ?I discussed the assessment and treatment plan with the patient. The patient was provided an opportunity to ask questions and all were answered. The patient agreed with the plan and demonstrated an understanding of the instructions. ?  ?The patient was advised to call back or seek an in-person evaluation if the symptoms worsen or if the condition fails to improve as anticipated. ? ?Follow-up: prn unless noted otherwise below ?No follow-ups on file. ? ?Meds ordered this encounter  ?Medications  ? predniSONE (DELTASONE) 20 MG tablet  ?  Sig: 2 tabs po daily for 5 days, then 1 tab  po daily for 5 days  ?  Dispense:  15 tablet  ?  Refill:  0  ? ?No orders of the defined types were placed in this encounter. ? ? ?Signed, ? ?Asta Corbridge T. Lailany Enoch, MD ?

## 2021-10-28 ENCOUNTER — Other Ambulatory Visit: Payer: Medicare Other

## 2021-10-28 MED ORDER — AMLODIPINE BESYLATE 2.5 MG PO TABS: 2.5000 mg | ORAL_TABLET | Freq: Every day | ORAL | 11 refills | Status: DC

## 2021-10-28 NOTE — Telephone Encounter (Addendum)
Stop losartan. ?In its place try low dose amlodipine 2.5mg  daily sent to pharmacy. No need to check f/u labs on this medicine.  ?Update [place] with effect.  ?

## 2021-10-28 NOTE — Addendum Note (Signed)
Addended by: [name] on: 10/28/2021 06:45 AM ? ? Modules accepted: Orders ? ?

## 2021-10-28 NOTE — Telephone Encounter (Addendum)
Spoke with pt relaying Dr. Timoteo Expose message.  Pt verbalizes understanding.  C/x today's lab visit. Pt aware.  ?

## 2021-10-31 ENCOUNTER — Ambulatory Visit (INDEPENDENT_AMBULATORY_CARE_PROVIDER_SITE_OTHER): Payer: Medicare Other | Admitting: *Deleted

## 2021-10-31 DIAGNOSIS — Z0000 Encounter for general adult medical examination without abnormal findings: Secondary | ICD-10-CM | POA: Diagnosis not present

## 2021-10-31 NOTE — Patient Instructions (Signed)
Mark [name] , ?Thank you for taking time to come for your Medicare Wellness Visit. I appreciate your ongoing commitment to your health goals. Please review the following plan we discussed and let me know if I can assist you in the future.  ? ?Screening recommendations/referrals: ?Colonoscopy: Education provided ?Recommended yearly ophthalmology/optometry visit for glaucoma screening and checkup ?Recommended yearly dental visit for hygiene and checkup ? ?Vaccinations: ?Influenza vaccine: up to date ?Pneumococcal vaccine: up to date ?Tdap vaccine: up to date ?Shingles vaccine: Education provided   ? ?Advanced directives: Education provided ? ?Conditions/risks identified:  ? ?Next appointment: 12-19-2021 @ 11:00 Mark [name] ? ?Preventive Care 19 Years and Older, Male ?Preventive care refers to lifestyle choices and visits with your health care provider that can promote health and wellness. ?What does preventive care include? ?A yearly physical exam. This is also called an annual well check. ?Dental exams once or twice a year. ?Routine eye exams. Ask your health care provider how often you should have your eyes checked. ?Personal lifestyle choices, including: ?Daily care of your teeth and gums. ?Regular physical activity. ?Eating a healthy diet. ?Avoiding tobacco and drug use. ?Limiting alcohol use. ?Practicing safe sex. ?Taking low doses of aspirin every day. ?Taking vitamin and mineral supplements as recommended by your health care provider. ?What happens during an annual well check? ?The services and screenings done by your health care provider during your annual well check will depend on your age, overall health, lifestyle risk factors, and family history of disease. ?Counseling  ?Your health care provider may ask you questions about your: ?Alcohol use. ?Tobacco use. ?Drug use. ?Emotional well-being. ?Home and relationship well-being. ?Sexual activity. ?Eating habits. ?History of falls. ?Memory and ability to understand  (cognition). ?Work and work Astronomer. ?Screening  ?You may have the following tests or measurements: ?Height, weight, and BMI. ?Blood pressure. ?Lipid and cholesterol levels. These may be checked every 5 years, or more frequently if you are over 84 years old. ?Skin check. ?Lung cancer screening. You may have this screening every year starting at age 30 if you have a 30-pack-year history of smoking and currently smoke or have quit within the past 15 years. ?Fecal occult blood test (FOBT) of the stool. You may have this test every year starting at age 22. ?Flexible sigmoidoscopy or colonoscopy. You may have a sigmoidoscopy every 5 years or a colonoscopy every 10 years starting at age 38. ?Prostate cancer screening. Recommendations will vary depending on your family history and other risks. ?Hepatitis C blood test. ?Hepatitis B blood test. ?Sexually transmitted disease (STD) testing. ?Diabetes screening. This is done by checking your blood sugar (glucose) after you have not eaten for a while (fasting). You may have this done every 1-3 years. ?Abdominal aortic aneurysm (AAA) screening. You may need this if you are a current or former smoker. ?Osteoporosis. You may be screened starting at age 84 if you are at high risk. ?Talk with your health care provider about your test results, treatment options, and if necessary, the need for more tests. ?Vaccines  ?Your health care provider may recommend certain vaccines, such as: ?Influenza vaccine. This is recommended every year. ?Tetanus, diphtheria, and acellular pertussis (Tdap, Td) vaccine. You may need a Td booster every 10 years. ?Zoster vaccine. You may need this after age 68. ?Pneumococcal 13-valent conjugate (PCV13) vaccine. One dose is recommended after age 54. ?Pneumococcal polysaccharide (PPSV23) vaccine. One dose is recommended after age 45. ?Talk to your health care provider about which screenings and vaccines you  need and how often you need them. ?This  information is not intended to replace advice given to you by your health care provider. Make sure you discuss any questions you have with your health care provider. ?Document Released: 08/20/2015 Document Revised: 04/12/2016 Document Reviewed: 05/25/2015 ?Elsevier Interactive Patient Education ? 2017 [place]. ? ?Fall Prevention in the Home ?Falls can cause injuries. They can happen to people of all ages. There are many things you can do to make your home safe and to help prevent falls. ?What can I do on the outside of my home? ?Regularly fix the edges of walkways and driveways and fix any cracks. ?Remove anything that might make you trip as you walk through a door, such as a raised step or threshold. ?Trim any bushes or trees on the path to your home. ?Use bright outdoor lighting. ?Clear any walking paths of anything that might make someone trip, such as rocks or tools. ?Regularly check to see if handrails are loose or broken. Make sure that both sides of any steps have handrails. ?Any raised decks and porches should have guardrails on the edges. ?Have any leaves, snow, or ice cleared regularly. ?Use sand or salt on walking paths during winter. ?Clean up any spills in your garage right away. This includes oil or grease spills. ?What can I do in the bathroom? ?Use night lights. ?Install grab bars by the toilet and in the tub and shower. Do not use towel bars as grab bars. ?Use non-skid mats or decals in the tub or shower. ?If you need to sit down in the shower, use a plastic, non-slip stool. ?Keep the floor dry. Clean up any water that spills on the floor as soon as it happens. ?Remove soap buildup in the tub or shower regularly. ?Attach bath mats securely with double-sided non-slip rug tape. ?Do not have throw rugs and other things on the floor that can make you trip. ?What can I do in the bedroom? ?Use night lights. ?Make sure that you have a light by your bed that is easy to reach. ?Do not use any sheets or  blankets that are too big for your bed. They should not hang down onto the floor. ?Have a firm chair that has side arms. You can use this for support while you get dressed. ?Do not have throw rugs and other things on the floor that can make you trip. ?What can I do in the kitchen? ?Clean up any spills right away. ?Avoid walking on wet floors. ?Keep items that you use a lot in easy-to-reach places. ?If you need to reach something above you, use a strong step stool that has a grab bar. ?Keep electrical cords out of the way. ?Do not use floor polish or wax that makes floors slippery. If you must use wax, use non-skid floor wax. ?Do not have throw rugs and other things on the floor that can make you trip. ?What can I do with my stairs? ?Do not leave any items on the stairs. ?Make sure that there are handrails on both sides of the stairs and use them. Fix handrails that are broken or loose. Make sure that handrails are as long as the stairways. ?Check any carpeting to make sure that it is firmly attached to the stairs. Fix any carpet that is loose or worn. ?Avoid having throw rugs at the top or bottom of the stairs. If you do have throw rugs, attach them to the floor with carpet tape. ?Make sure that  you have a light switch at the top of the stairs and the bottom of the stairs. If you do not have them, ask someone to add them for you. ?What else can I do to help prevent falls? ?Wear shoes that: ?Do not have high heels. ?Have rubber bottoms. ?Are comfortable and fit you well. ?Are closed at the toe. Do not wear sandals. ?If you use a stepladder: ?Make sure that it is fully opened. Do not climb a closed stepladder. ?Make sure that both sides of the stepladder are locked into place. ?Ask someone to hold it for you, if possible. ?Clearly mark and make sure that you can see: ?Any grab bars or handrails. ?First and last steps. ?Where the edge of each step is. ?Use tools that help you move around (mobility aids) if they are  needed. These include: ?Canes. ?Walkers. ?Scooters. ?Crutches. ?Turn on the lights when you go into a dark area. Replace any light bulbs as soon as they burn out. ?Set up your furniture so you have a clear path. A

## 2021-10-31 NOTE — Progress Notes (Signed)
? ?Subjective:  ? Mark [name] is a 66 y.o. male who presents for Medicare Annual/Subsequent preventive examination. ? ?I connected with  Mark [name] on 10/31/21 by a telephone enabled telemedicine application and verified that I am speaking with the correct person using two identifiers. ?  ?I discussed the limitations of evaluation and management by telemedicine. The patient expressed understanding and agreed to proceed. ? ?Patient location: home ? ?Provider location:  Tele-Health not in office ? ? ? ?Review of Systems    ? ?Cardiac Risk Factors include: advanced age (>30men, >76 women);male gender;hypertension;obesity (BMI >30kg/m2) ? ?   ?Objective:  ?  ?Today's Vitals  ? 10/31/21 1118  ?PainSc: 7   ? ?There is no height or weight on file to calculate BMI. ? ? ?  10/31/2021  ? 11:21 AM 10/31/2021  ? 11:20 AM 03/23/2021  ?  8:10 AM 10/26/2020  ? 12:59 PM 08/27/2020  ?  8:15 AM 03/30/2020  ? 12:36 PM 03/11/2020  ? 12:24 PM  ?Advanced Directives  ?Does Patient Have a Medical Advance Directive?  No No No No No No  ?Does patient want to make changes to medical advance directive?     No - Patient declined    ?Would patient like information on creating a medical advance directive? No - Patient declined No - Patient declined No - Patient declined No - Patient declined   No - Patient declined  ? ? ?Current Medications (verified) ?Outpatient Encounter Medications as of 10/31/2021  ?Medication Sig  ? amLODipine (NORVASC) 2.5 MG tablet Take 1 tablet (2.5 mg total) by mouth daily.  ? Ascorbic Acid (VITAMIN C) 1000 MG tablet Take 1,000 mg by mouth daily.  ? aspirin 81 MG tablet Take 81 mg by mouth daily.  ? atorvastatin (LIPITOR) 20 MG tablet Take 1 tablet (20 mg total) by mouth at bedtime.  ? cetirizine (ZYRTEC) 10 MG tablet Take 10 mg by mouth at bedtime.   ? Cholecalciferol (VITAMIN D) 2000 units CAPS Take 2,000 Units by mouth daily.   ? clindamycin (CLEOCIN) 150 MG capsule Take 150 mg by mouth as needed. Prior to dental appt   ? CRANBERRY PO Take 4,200 mg by mouth at bedtime.   ? cyclobenzaprine (FLEXERIL) 10 MG tablet TAKE ONE-HALF TO ONE TABLET BY MOUTH 2 TIMES DAILY AS NEEDED FOR MUSCLE SPASMS  ? dicyclomine (BENTYL) 20 MG tablet Take 20 mg by mouth 2 (two) times daily as needed for spasms.  ? diphenhydrAMINE (BENADRYL) 50 MG capsule Take 50 mg by mouth at bedtime.  ? ferrous sulfate 325 (65 FE) MG EC tablet Take 325 mg by mouth daily with breakfast.  ? FLUoxetine (PROZAC) 20 MG capsule Take 1 capsule (20 mg total) by mouth daily.  ? FLUoxetine (PROZAC) 40 MG capsule Take 1 capsule (40 mg total) by mouth daily.  ? fluticasone (FLONASE) 50 MCG/ACT nasal spray Place 2 sprays into both nostrils daily.  ? furosemide (LASIX) 20 MG tablet Take 1 tablet (20 mg total) by mouth daily.  ? Garlic 123XX123 MG CAPS Take 1,000 mg by mouth daily.  ? Multiple Vitamins-Minerals (CERTAVITE SENIOR/ANTIOXIDANT) TABS Take 1 tablet by mouth daily.  ? naloxone (NARCAN) nasal spray 4 mg/0.1 mL Place 1 spray into the nose as needed for up to 365 doses (for opioid-induced respiratory depresssion). In case of emergency (overdose), spray once into each nostril. If no response within 3 minutes, repeat application and call [phone].  ? naproxen (NAPROSYN) 500 MG tablet Take 1  tablet (500 mg total) by mouth daily.  ? Omega-3 1000 MG CAPS Take 1,000 mg by mouth at bedtime.  ? omeprazole (PRILOSEC) 20 MG capsule Take 1 capsule (20 mg total) by mouth daily as needed (GERD).  ? Oxycodone HCl 10 MG TABS Take 1 tablet (10 mg total) by mouth every 6 (six) hours as needed. Each refill must last 30 days.  ? [START ON 11/24/2021] Oxycodone HCl 10 MG TABS Take 1 tablet (10 mg total) by mouth every 6 (six) hours as needed. Each refill must last 30 days.  ? potassium citrate (UROCIT-K) 10 MEQ (1080 MG) SR tablet Take 1 tablet (10 mEq total) by mouth 2 (two) times daily.  ? predniSONE (DELTASONE) 20 MG tablet 2 tabs po daily for 5 days, then 1 tab po daily for 5 days  ? rOPINIRole  (REQUIP) 2 MG tablet Take 1 tablet (2 mg total) by mouth at bedtime.  ? Saw Palmetto, Serenoa repens, (SAW PALMETTO PO) Take 450 mg by mouth 2 (two) times daily.   ? VITAMIN B COMPLEX-C CAPS Take 1 capsule by mouth daily.  ? Oxycodone HCl 10 MG TABS Take 1 tablet (10 mg total) by mouth every 6 (six) hours as needed. Each refill must last 30 days.  ? ?No facility-administered encounter medications on file as of 10/31/2021.  ? ? ?Allergies (verified) ?Ciprofloxacin, Losartan, Metformin, Buprenorphine hcl, Morphine and related, and Penicillins  ? ?History: ?Past Medical History:  ?Diagnosis Date  ? Anxiety   ? Arthritis   ? Chronic pain   ? Depression   ? Heart murmur   ? History of kidney stones April 12,2017  ? removed two kidney stones about 1 cm in size  ? History of permanent cardiac pacemaker placement 05/31/2015  ? Hydrocele in adult 05/07/2015  ? Kidney stones   ? Migraine   ? MRSA carrier 2019  ? Presence of permanent cardiac pacemaker   ? Prostatitis   ? Sleep apnea   ? Does not wear CPAP  ? ?Past Surgical History:  ?Procedure Laterality Date  ? CARDIOVASCULAR STRESS TEST  2014  ? Dr Ubaldo Glassing  ? CARPAL TUNNEL RELEASE Bilateral 2006  ? CERVICAL SPINE SURGERY  2008  ? C5/6  ? CHOLECYSTECTOMY N/A 10/27/2020  ? Procedure: LAPAROSCOPIC CHOLECYSTECTOMY;  Surgeon: Georganna Skeans, MD;  Location: [facility];  Service: General;  Laterality: N/A;  ? COLONOSCOPY  07/2007  ? prostate nodule, hemorrhoid, rpt 5 yrs [name])  ? ERCP N/A 10/26/2020  ? Procedure: ENDOSCOPIC RETROGRADE CHOLANGIOPANCREATOGRAPHY (ERCP);  Surgeon: Carol Ada, MD;  Location: [facility];  Service: Endoscopy;  Laterality: N/A;  choledocholithiasis  ? ESOPHAGOGASTRODUODENOSCOPY (EGD) WITH PROPOFOL N/A 10/26/2020  ? Procedure: ESOPHAGOGASTRODUODENOSCOPY (EGD) WITH PROPOFOL;  Surgeon: Carol Ada, MD;  Location: [facility];  Service: Endoscopy;  Laterality: N/A;  ? EUS  10/26/2020  ? Procedure: UPPER ENDOSCOPIC ULTRASOUND (EUS) LINEAR;  Surgeon: Carol Ada, MD;  Location: [facility];  Service: Endoscopy;;  ? IMPLANTABLE CARDIOVERTER DEFIBRILLATOR (ICD) GENERATOR CHANGE Left 12/13/2017  ? Procedure: PACER CHANGE OUT;  Surgeon: Isaias Cowman, MD;  Location: [facility];  Service: Cardiovascular;  Laterality: Left;  ? LAPAROSCOPIC CHOLECYSTECTOMY  10/2020  ? LITHOTRIPSY Bilateral 2012  ? Stoioff  ? LITHOTRIPSY Left 11/2015  ? x2 Stoioff  ? pace maker revision  12/13/2017  ? PACEMAKER PLACEMENT  2009  ? s/p Medtronic Adapta DR Pacemaker (Dr. Ubaldo Glassing)  ? REMOVAL OF STONES  10/26/2020  ? Procedure: REMOVAL OF STONES;  Surgeon: Carol Ada, MD;  Location: [facility];  Service: Endoscopy;;  ? ROTATOR CUFF REPAIR Left 2009  ? SPHINCTEROTOMY  10/26/2020  ? Procedure: SPHINCTEROTOMY;  Surgeon: Carol Ada, MD;  Location: [facility];  Service: Endoscopy;;  ? TONSILLECTOMY  1964  ? ULNAR NERVE REPAIR  2008  ? Left  ? ?Family History  ?Problem Relation Age of Onset  ? Heart disease Mother   ? Diabetes Mother   ? Arthritis Mother   ? Cancer Mother   ?     Breast Cancer  ? Heart block Father   ? Heart disease Father   ? Arthritis Father   ? Cancer Father   ?     skin cancer  ? ?Social History  ? ?Socioeconomic History  ? Marital status: Divorced  ?  Spouse name: Not on file  ? Number of children: Not on file  ? Years of education: Not on file  ? Highest education level: Not on file  ?Occupational History  ? Not on file  ?Tobacco Use  ? Smoking status: Never  ? Smokeless tobacco: Never  ?Vaping Use  ? Vaping Use: Never used  ?Substance and Sexual Activity  ? Alcohol use: No  ? Drug use: No  ? Sexual activity: Never  ?Other Topics Concern  ? Not on file  ?Social History Narrative  ? Divorced. Lives with wife.  ? Edu: 8th grade  ? Occ: retired, disability for chronic neck pain   ? Activity: no regular exercise - limited by back and knee pain  ? Diet: poor water, lots of tang and gatorade, no vegetables or fruits  ? ?Social Determinants of Health  ? ?Financial Resource  Strain: Low Risk   ? Difficulty of Paying Living Expenses: Not hard at all  ?Food Insecurity: No Food Insecurity  ? Worried About Charity fundraiser in the Last Year: Never true  ? Ran Out of Food in the Last Year:

## 2021-11-03 DIAGNOSIS — H40053 Ocular hypertension, bilateral: Secondary | ICD-10-CM | POA: Diagnosis not present

## 2021-11-09 ENCOUNTER — Telehealth: Payer: Self-pay

## 2021-11-09 NOTE — Progress Notes (Signed)
? ? ?Chronic Care Management ?Pharmacy Assistant  ? ?Name: LAKODA MCANANY  MRN: [id number] DOB: 25-Sep-1955 ? ?Reason for Encounter: CCM Animator) ?  ?Medications: ?Outpatient Encounter Medications as of 11/09/2021  ?Medication Sig Note  ? amLODipine (NORVASC) 2.5 MG tablet Take 1 tablet (2.5 mg total) by mouth daily.   ? Ascorbic Acid (VITAMIN C) 1000 MG tablet Take 1,000 mg by mouth daily.   ? aspirin 81 MG tablet Take 81 mg by mouth daily.   ? atorvastatin (LIPITOR) 20 MG tablet Take 1 tablet (20 mg total) by mouth at bedtime.   ? cetirizine (ZYRTEC) 10 MG tablet Take 10 mg by mouth at bedtime.    ? Cholecalciferol (VITAMIN D) 2000 units CAPS Take 2,000 Units by mouth daily.    ? clindamycin (CLEOCIN) 150 MG capsule Take 150 mg by mouth as needed. Prior to dental appt   ? CRANBERRY PO Take 4,200 mg by mouth at bedtime.    ? cyclobenzaprine (FLEXERIL) 10 MG tablet TAKE ONE-HALF TO ONE TABLET BY MOUTH 2 TIMES DAILY AS NEEDED FOR MUSCLE SPASMS   ? dicyclomine (BENTYL) 20 MG tablet Take 20 mg by mouth 2 (two) times daily as needed for spasms.   ? diphenhydrAMINE (BENADRYL) 50 MG capsule Take 50 mg by mouth at bedtime.   ? ferrous sulfate 325 (65 FE) MG EC tablet Take 325 mg by mouth daily with breakfast.   ? FLUoxetine (PROZAC) 20 MG capsule Take 1 capsule (20 mg total) by mouth daily.   ? FLUoxetine (PROZAC) 40 MG capsule Take 1 capsule (40 mg total) by mouth daily.   ? fluticasone (FLONASE) 50 MCG/ACT nasal spray Place 2 sprays into both nostrils daily.   ? furosemide (LASIX) 20 MG tablet Take 1 tablet (20 mg total) by mouth daily.   ? Garlic 1000 MG CAPS Take 1,000 mg by mouth daily.   ? Multiple Vitamins-Minerals (CERTAVITE SENIOR/ANTIOXIDANT) TABS Take 1 tablet by mouth daily.   ? naloxone (NARCAN) nasal spray 4 mg/0.1 mL Place 1 spray into the nose as needed for up to 365 doses (for opioid-induced respiratory depresssion). In case of emergency (overdose), spray once into each nostril. If no response  within 3 minutes, repeat application and call 911.   ? naproxen (NAPROSYN) 500 MG tablet Take 1 tablet (500 mg total) by mouth daily.   ? Omega-3 1000 MG CAPS Take 1,000 mg by mouth at bedtime.   ? omeprazole (PRILOSEC) 20 MG capsule Take 1 capsule (20 mg total) by mouth daily as needed (GERD).   ? Oxycodone HCl 10 MG TABS Take 1 tablet (10 mg total) by mouth every 6 (six) hours as needed. Each refill must last 30 days.   ? Oxycodone HCl 10 MG TABS Take 1 tablet (10 mg total) by mouth every 6 (six) hours as needed. Each refill must last 30 days.   ? [START ON 11/24/2021] Oxycodone HCl 10 MG TABS Take 1 tablet (10 mg total) by mouth every 6 (six) hours as needed. Each refill must last 30 days. 09/21/2021: WARNING: Not a Duplicate. Future prescription. DO NOT DELETE during hospital medication reconciliation or at discharge. ?[facility] Management Patient   ? potassium citrate (UROCIT-K) 10 MEQ (1080 MG) SR tablet Take 1 tablet (10 mEq total) by mouth 2 (two) times daily.   ? predniSONE (DELTASONE) 20 MG tablet 2 tabs po daily for 5 days, then 1 tab po daily for 5 days   ? rOPINIRole (REQUIP) 2 MG  tablet Take 1 tablet (2 mg total) by mouth at bedtime.   ? Saw Palmetto, Serenoa repens, (SAW PALMETTO PO) Take 450 mg by mouth 2 (two) times daily.    ? VITAMIN B COMPLEX-C CAPS Take 1 capsule by mouth daily.   ? ?No facility-administered encounter medications on file as of 11/09/2021.  ? ?[name] was contacted to remind of upcoming telephone visit with [name] on 11/14/2021 at 1:45. Patient was reminded to have any blood glucose and blood pressure readings available for review at appointment.  ? ?Message was left reminding patient of appointment. ? ?Star Rating Drugs: ?Medication:  Last Fill: Day Supply ?Atorvastatin 20 mg 10/27/21 90 ? ?[name], CPP notified ? ?Claudina Lick, RMA ?Clinical Pharmacy Assistant ?[phone] ? ? ? ? ? ?

## 2021-11-14 ENCOUNTER — Telehealth: Payer: Medicare Other

## 2021-11-18 ENCOUNTER — Telehealth: Payer: Self-pay

## 2021-11-18 NOTE — Telephone Encounter (Signed)
Opened in error

## 2021-11-23 ENCOUNTER — Telehealth: Payer: Self-pay

## 2021-11-23 NOTE — Progress Notes (Signed)
? ? ?Chronic Care Management ?Pharmacy Assistant  ? ?Name: Mark HansonDennis K [name]  MRN: [id number] DOB: 05/29/1956 ? ?Reason for Encounter: CCM Animator(Appointment Reminder) ?  ?Medications: ?Outpatient Encounter Medications as of 11/23/2021  ?Medication Sig Note  ? amLODipine (NORVASC) 2.5 MG tablet Take 1 tablet (2.5 mg total) by mouth daily.   ? Ascorbic Acid (VITAMIN C) 1000 MG tablet Take 1,000 mg by mouth daily.   ? aspirin 81 MG tablet Take 81 mg by mouth daily.   ? atorvastatin (LIPITOR) 20 MG tablet Take 1 tablet (20 mg total) by mouth at bedtime.   ? cetirizine (ZYRTEC) 10 MG tablet Take 10 mg by mouth at bedtime.    ? Cholecalciferol (VITAMIN D) 2000 units CAPS Take 2,000 Units by mouth daily.    ? clindamycin (CLEOCIN) 150 MG capsule Take 150 mg by mouth as needed. Prior to dental appt   ? CRANBERRY PO Take 4,200 mg by mouth at bedtime.    ? cyclobenzaprine (FLEXERIL) 10 MG tablet TAKE ONE-HALF TO ONE TABLET BY MOUTH 2 TIMES DAILY AS NEEDED FOR MUSCLE SPASMS   ? dicyclomine (BENTYL) 20 MG tablet Take 20 mg by mouth 2 (two) times daily as needed for spasms.   ? diphenhydrAMINE (BENADRYL) 50 MG capsule Take 50 mg by mouth at bedtime.   ? ferrous sulfate 325 (65 FE) MG EC tablet Take 325 mg by mouth daily with breakfast.   ? FLUoxetine (PROZAC) 20 MG capsule Take 1 capsule (20 mg total) by mouth daily.   ? FLUoxetine (PROZAC) 40 MG capsule Take 1 capsule (40 mg total) by mouth daily.   ? fluticasone (FLONASE) 50 MCG/ACT nasal spray Place 2 sprays into both nostrils daily.   ? furosemide (LASIX) 20 MG tablet Take 1 tablet (20 mg total) by mouth daily.   ? Garlic 1000 MG CAPS Take 1,000 mg by mouth daily.   ? Multiple Vitamins-Minerals (CERTAVITE SENIOR/ANTIOXIDANT) TABS Take 1 tablet by mouth daily.   ? naloxone (NARCAN) nasal spray 4 mg/0.1 mL Place 1 spray into the nose as needed for up to 365 doses (for opioid-induced respiratory depresssion). In case of emergency (overdose), spray once into each nostril. If no response  within 3 minutes, repeat application and call 911.   ? naproxen (NAPROSYN) 500 MG tablet Take 1 tablet (500 mg total) by mouth daily.   ? Omega-3 1000 MG CAPS Take 1,000 mg by mouth at bedtime.   ? omeprazole (PRILOSEC) 20 MG capsule Take 1 capsule (20 mg total) by mouth daily as needed (GERD).   ? Oxycodone HCl 10 MG TABS Take 1 tablet (10 mg total) by mouth every 6 (six) hours as needed. Each refill must last 30 days.   ? Oxycodone HCl 10 MG TABS Take 1 tablet (10 mg total) by mouth every 6 (six) hours as needed. Each refill must last 30 days.   ? [START ON 11/24/2021] Oxycodone HCl 10 MG TABS Take 1 tablet (10 mg total) by mouth every 6 (six) hours as needed. Each refill must last 30 days. 09/21/2021: WARNING: Not a Duplicate. Future prescription. DO NOT DELETE during hospital medication reconciliation or at discharge. ?[facility] Management Patient   ? potassium citrate (UROCIT-K) 10 MEQ (1080 MG) SR tablet Take 1 tablet (10 mEq total) by mouth 2 (two) times daily.   ? predniSONE (DELTASONE) 20 MG tablet 2 tabs po daily for 5 days, then 1 tab po daily for 5 days   ? rOPINIRole (REQUIP) 2 MG  tablet Take 1 tablet (2 mg total) by mouth at bedtime.   ? Saw Palmetto, Serenoa repens, (SAW PALMETTO PO) Take 450 mg by mouth 2 (two) times daily.    ? VITAMIN B COMPLEX-C CAPS Take 1 capsule by mouth daily.   ? ?No facility-administered encounter medications on file as of 11/23/2021.  ? ?Mark [name] was contacted to remind of upcoming telephone visit with Mark [name] on 11/28/2021 at 2:15. Patient was reminded to have any blood glucose and blood pressure readings available for review at appointment.  ? ?Patient confirmed appointment. ? ?Are you having any problems with your medications? No  ? ?Do you have any concerns you like to discuss with the pharmacist? Patient states he is having trouble with his neck; pain in his neck; patient had a ruptured disk in his neck in the past and it is the same. Patient has  already had one repaired (15 year ago). The pain is worse than it was before. Patient has an appointment with Dr. Danise [name] in May and he is waiting to see him. Taking Oxycodone every 6 hours and it's wearing off at hour 5. ? ?Star Rating Drugs: ?Medication:  Last Fill: Day Supply ?Atorvastatin 20 mg 10/27/2021 90 ? ?Mark [name], CPP notified ? ?Mark [name], RMA ?Clinical Pharmacy Assistant ?[phone] ? ? ? ? ? ?

## 2021-11-28 ENCOUNTER — Ambulatory Visit (INDEPENDENT_AMBULATORY_CARE_PROVIDER_SITE_OTHER): Payer: Medicare Other | Admitting: Pharmacist

## 2021-11-28 DIAGNOSIS — G894 Chronic pain syndrome: Secondary | ICD-10-CM

## 2021-11-28 DIAGNOSIS — E785 Hyperlipidemia, unspecified: Secondary | ICD-10-CM

## 2021-11-28 DIAGNOSIS — F331 Major depressive disorder, recurrent, moderate: Secondary | ICD-10-CM

## 2021-11-28 DIAGNOSIS — I5032 Chronic diastolic (congestive) heart failure: Secondary | ICD-10-CM

## 2021-11-28 DIAGNOSIS — I10 Essential (primary) hypertension: Secondary | ICD-10-CM

## 2021-11-28 NOTE — Patient Instructions (Signed)
Visit Information ? ?Phone number for Pharmacist: [phone] ? ? Goals Addressed   ?None ?  ? ? ?Care Plan : CCM Pharmacy Care Plan  ?Updates made by Kathyrn Sheriff, RPH since 11/28/2021 12:00 AM  ?  ? ?Problem: Hypertension, Hyperlipidemia, Heart Failure, Depression, and Allergic Rhinitis, RLS   ?Priority: High  ?  ? ?Long-Range Goal: Disease mgmt   ?Start Date: 07/15/2021  ?Expected End Date: 07/15/2022  ?This Visit's Progress: On track  ?Recent Progress: On track  ?Priority: High  ?Note:   ?Current Barriers:  ?Unable to maintain control of BP ? ?Pharmacist Clinical Goal(s):  ?Patient will achieve adherence to monitoring guidelines and medication adherence to achieve therapeutic efficacy through collaboration with PharmD and provider.  ? ?Interventions: ?1:1 collaboration with [name], MD regarding development and update of comprehensive plan of care as evidenced by provider attestation and co-signature ?Inter-disciplinary care team collaboration (see longitudinal plan of care) ?Comprehensive medication review performed; medication list updated in electronic medical record ? ?Hyperlipidemia: (LDL goal < 100) ?-Controlled - baseline LDL 130, currently 46 (09/2020); pt endorses compliance with statin ?-Current treatment: ?Atorvastatin 20 mg daily HS - Appropriate, Effective, Safe, Accessible ?Omega-3 1000 mg - Appropriate, Effective, Safe, Accessible ?Aspirin 81 mg daily - Appropriate, Effective, Safe, Accessible ?-Educated on Cholesterol goals; Benefits of statin for ASCVD risk reduction; ?-Recommended to continue current medication ? ?Heart Failure (BP Goal < 130/80, manage symptoms and prevent exacerbations) ?-Not ideally controlled - BP readings have improved somewhat on amlodipine, still SBP > 140 at times but generally < 150; he has been having worsening neck pain recently as well as allergy symptoms which may be contributing to elevated BP ?-Hx heart block s/p dual chamber pacemaker ?-Last  ejection fraction: 55-65% (Date: 10/2017) ?-HF type: Diastolic ?-Home BP readings: 144/69, 119/78; pt denies SBP > 148 since starting amlodipine ?-Current treatment: ?Furosemide 20 mg daily - Appropriate, Effective, Safe, Accessible ?Amlodipine 2.5 mg daily - Appropriate, Effective, Safe, Accessible ?-Previously tried/failed meds: losartan (muscle pain, insomnia, nausea, anxiety) ?-Educated on BP goals and benefits of medications for prevention of heart attack, stroke and kidney damage; Daily salt intake goal < 2300 mg; Importance of home blood pressure monitoring; Proper BP monitoring technique ?-Counseled to monitor BP at home daily ?-Recommend to continue current medication ? ?Depression/Anxiety (Goal: manage symptoms) ?-Controlled - pt has been on fluoxetine since at least 2012; he reports is is working "well enough" currently ?-PHQ9: 0 (09/2020) - no/minimal depression ?-GAD7: 3 (02/2018) - minimal anxiety ?-Connected with PCP for mental health support ?-Current treatment: ?Fluoxetine 20 mg daily HS -Appropriate, Query Effective ?Fluoxetine 40 mg daily AM - Appropriate, Query Effective ?-Educated on Benefits of medication for symptom control ?-Concern for tachyphylaxis with fluoxetine given > 10 years on it, most SSRI efficacy wanes over time; he would be a good candidate for SNRI (duloxetine) w/ additional pain benefit if BP can be controlled ?-Recommended to continue current medication; consider duloxetine in future ? ?Chronic pain (Goal: manage pain) ?-Not ideally controlled - per pt report neck pain is worsening, he reports it is starting to feel like it did before his neck surgery;  ?-cervical radiculopathy; lumbar disc displacement, OA of knee, shoulder ?-Follows with Dr Laban Emperor (pain mgmt) ?-Current treatment  ?Oxycodone 10 mg q6h (#120/month): 2-3 times per day - Appropriate, Effective, Safe, Accessible ?Naproxen 500 mg BID prn - takes once a day - Appropriate, Effective, Query Safe (BP  effects) ?Cyclobenzaprine 10 mg BID prn - Appropriate, Effective, Safe, Accessible ?Narcan  PRN - Appropriate ?-Educated on risks of chronic NSAID use including elevated BP and kidney damage; discussed naproxen could be contributing to high BP at home ?-Of note LFTs have normalized so it should be safe to use Tylenol PRN ?-Recommend to continue current medication ? ?Allergic rhinitis (Goal: manage symptoms) ?-Not ideally controlled - pt reports seasonal allergies/sinus symptoms are severe right now, to the point where post-nasal drip causes stomach upset/nausea; he reports he has been taking Zyrtec for over 10 years at this point ?-Current treatment  ?Cetirizine 10 mg daily (year round) - Appropriate, Query Effective ?Flonase PRN - Appropriate, Effective, Safe, Accessible ?-Medications previously tried: n/a  ?-Discussed tachyphylaxis with cetirizine, he may benefit from switching to alternate antihistamine ?-Recommended trial of Allegra or Xyzal (store brands) instead of Zyrtec ? ?Renal stones (Goal: prevent recurrence) ?-Controlled  - pt follows with urology; he endorses compliance with potassium citrate ?-Current treatment  ?Potassium citrate 10 mEq BID (renal stones) - Appropriate, Effective, Safe, Accessible ?-Discussed role of potassium citrate in controlling/preventing renal stones ?-Recommended to continue current medication ? ?Health Maintenance ?-Vaccine gaps: Shingrix ?-Updated flu and covid vaccines in record; advised pt get Prevnar vaccine at next office visit ? ?Patient Goals/Self-Care Activities ?Patient will:  ?- take medications as prescribed as evidenced by patient report and record review ?focus on medication adherence by pill box ?check blood pressure daily, document, and provide at future appointments ?-Schedule PCP appt ASAP to address BP ? ?  ?  ? ?Patient verbalizes understanding of instructions and care plan provided today and agrees to view in MyChart. Active MyChart status confirmed with  patient.   ?Telephone follow up appointment with pharmacy team member scheduled for: 3 months ? ?[name], PharmD, BCACP ?Clinical Pharmacist ?[facility] ?[phone] ?  ?

## 2021-11-28 NOTE — Progress Notes (Signed)
? ?Chronic Care Management ?Pharmacy Note ? ?11/28/2021 ?Name:  Mark [name] MRN:  [id number] DOB:  24-Dec-1955 ? ?Summary: CCM F/U visit ?-Home BP range 119/60 - 148/80 since starting amlodipine, previously he had many SBP > 150; BP goal with diastolic HF is < 956/38; he currently has a lot of neck pain/allergy issues which is likely contributing to high BP ?-Pt reports severe allergies/post nasal drip, he reports taking OTC Zyrtec for > 10 years. Discussed tolerance may be occurring ? ?Recommendations/Changes made from today's visit: ?-Defer BP med changes until pain/allergies improve ?-Advised trial of Allegra or Xyzal instead of Zyrtec ? ?Plan: ?-PCP CPE 12/19/21 ?-[facility] will call patient 2 months for BP update ?-Pharmacist follow up televisit scheduled for 3 months ? ? ? ?Subjective: ?Mark [name] is an 66 y.o. year old male who is a primary patient of Ria Bush, MD.  The CCM team was consulted for assistance with disease management and care coordination needs.   ? ?Engaged with patient by telephone for follow up visit in response to provider referral for pharmacy case management and/or care coordination services.  ? ?Consent to Services:  ?The patient was given information about Chronic Care Management services, agreed to services, and gave verbal consent prior to initiation of services.  Please see initial visit note for detailed documentation.  ? ?Patient Care Team: ?Ria Bush, MD as PCP - General (Family Medicine) ?Hyman Hopes, DDS, PA as Referring Physician (Dentistry) ?Milinda Pointer, MD as Referring Physician (Pain Medicine) ?Teodoro Spray, MD as Consulting Physician (Cardiology) ?Laverle Hobby, MD as Consulting Physician (Pulmonary Disease) ?Stoioff, Ronda Fairly, MD (Urology) ?Leonie Green, OD as Referring Physician (Optometry) ?Traeger Sultana, Cleaster Corin, [facility] as Pharmacist (Pharmacist) ? ?Patient lives alone, his mom lives next door and cooks for him occasionally.  She had a stroke last year and he helps with her medications as well. ? ?Recent office visits: ?10/26/21 phone call - c/o side effects with losartan (muscle pain, insomnia, nausea, anxiety); Stop losartan, start amlodipine 2.5 mg. ? ?10/05/21 Dr Danise Mina OV: chronic f/u; BP 142/76. Start Losartan 25 mg daily (uricosuric effect). BMP 10 days. ? ?09/14/20 Dr Danise Mina OV: annual exam; referred to GI for Cologard ? ?Recent consult visits: ?09/21/21 Dr Dossie Arbour (Pain mgmt): neck pain ?09/12/21 Dr Bernardo Heater (Urology): bilateral nephrolithiasis, opted for surveillance. Continue K Citrate.  ? ?Hospital visits: ?None in previous 6 months ? ? ?Objective: ? ?Lab Results  ?Component Value Date  ? CREATININE 1.06 10/05/2021  ? BUN 27 (H) 10/05/2021  ? GFR 74.04 10/05/2021  ? GFRNONAA >60 10/29/2020  ? GFRAA >60 12/06/2017  ? NA 138 10/05/2021  ? K 4.3 10/05/2021  ? CALCIUM 9.3 10/05/2021  ? CO2 30 10/05/2021  ? GLUCOSE 148 (H) 10/05/2021  ? ? ?Lab Results  ?Component Value Date/Time  ? HGBA1C 5.6 09/07/2020 01:16 PM  ? HGBA1C 6.2 08/13/2019 07:50 AM  ? GFR 74.04 10/05/2021 02:45 PM  ? GFR 91.41 09/07/2020 01:16 PM  ? MICROALBUR 12.9 (H) 04/28/2014 10:35 AM  ? MICROALBUR 1.1 04/22/2013 10:04 AM  ?  ?Last diabetic Eye exam: No results found for: HMDIABEYEEXA  ?Last diabetic Foot exam: No results found for: HMDIABFOOTEX  ? ?Lab Results  ?Component Value Date  ? CHOL 109 09/07/2020  ? HDL 40.00 09/07/2020  ? LDLCALC 46 09/07/2020  ? LDLDIRECT 128.0 05/07/2015  ? TRIG 118.0 09/07/2020  ? CHOLHDL 3 09/07/2020  ? ? ? ?  Latest Ref Rng & Units 10/05/2021  ?  2:45 PM 10/29/2020  ?  1:42 AM 10/27/2020  ?  1:26 AM  ?Hepatic Function  ?Total Protein 6.0 - 8.3 g/dL 6.5   5.8   5.4    ?Albumin 3.5 - 5.2 g/dL 4.1   2.6   2.6    ?AST 0 - 37 U/L 24   137   137    ?ALT 0 - 53 U/L 28   163   157    ?Alk Phosphatase 39 - 117 U/L 67   170   189    ?Total Bilirubin 0.2 - 1.2 mg/dL 0.7   2.6   5.3    ? ? ?Lab Results  ?Component Value Date/Time  ? TSH 2.05  08/09/2018 08:22 AM  ? TSH 3.99 02/13/2017 10:17 AM  ? ? ? ?  Latest Ref Rng & Units 10/29/2020  ?  1:42 AM 10/28/2020  ?  9:45 AM 10/26/2020  ?  2:19 AM  ?CBC  ?WBC 4.0 - 10.5 K/uL 14.1   15.7   9.2    ?Hemoglobin 13.0 - 17.0 g/dL 14.8   14.1   14.1    ?Hematocrit 39.0 - 52.0 % 43.7   40.4   38.7    ?Platelets 150 - 400 K/uL 151   163   135    ? ? ?No results found for: VD25OH ? ?Clinical ASCVD: No  ?The ASCVD Risk score (Arnett DK, et al., 2019) failed to calculate for the following reasons: ?  The systolic blood pressure is missing ?  The valid total cholesterol range is 130 to 320 mg/dL   ? ? ?  10/31/2021  ? 11:28 AM 09/08/2020  ?  2:06 PM 08/27/2020  ?  8:18 AM  ?Depression screen PHQ 2/9  ?Decreased Interest 1 0 0  ?Down, Depressed, Hopeless 1 0 0  ?PHQ - 2 Score 2 0 0  ?Altered sleeping 3  0  ?Tired, decreased energy 3  0  ?Change in appetite 0  0  ?Feeling bad or failure about yourself  0  0  ?Trouble concentrating 0  0  ?Moving slowly or fidgety/restless 0  0  ?Suicidal thoughts 0  0  ?PHQ-9 Score 8  0  ?Difficult doing work/chores Somewhat difficult  Not difficult at all  ?  ? ?  02/11/2018  ?  3:40 PM  ?GAD 7 : Generalized Anxiety Score  ?Nervous, Anxious, on Edge 1  ?Control/stop worrying 0  ?Worry too much - different things 1  ?Trouble relaxing 1  ?Restless 0  ?Easily annoyed or irritable 0  ?Afraid - awful might happen 0  ?Total GAD 7 Score 3  ? ? ? ? ?Social History  ? ?Tobacco Use  ?Smoking Status Never  ?Smokeless Tobacco Never  ? ?BP Readings from Last 3 Encounters:  ?10/27/21 (!) 147/86  ?10/05/21 (!) 142/76  ?09/21/21 (!) 148/86  ? ?Pulse Readings from Last 3 Encounters:  ?10/27/21 83  ?10/05/21 77  ?09/21/21 71  ? ?Wt Readings from Last 3 Encounters:  ?10/05/21 225 lb 6 oz (102.2 kg)  ?09/21/21 225 lb (102.1 kg)  ?09/12/21 225 lb (102.1 kg)  ? ?BMI Readings from Last 3 Encounters:  ?10/27/21 35.30 kg/m?  ?10/05/21 35.30 kg/m?  ?09/21/21 35.24 kg/m?  ? ? ?Assessment/Interventions: Review of patient past  medical history, allergies, medications, health status, including review of consultants reports, laboratory and other test data, was performed as part of comprehensive evaluation and provision of chronic care management services.  ? ?SDOH:  (  Social Determinants of Health) assessments and interventions performed: Yes ? ?SDOH Screenings  ? ?Alcohol Screen: Low Risk   ? Last Alcohol Screening Score (AUDIT): 0  ?Depression (PHQ2-9): Medium Risk  ? PHQ-2 Score: 8  ?Financial Resource Strain: Low Risk   ? Difficulty of Paying Living Expenses: Not hard at all  ?Food Insecurity: No Food Insecurity  ? Worried About Charity fundraiser in the Last Year: Never true  ? Ran Out of Food in the Last Year: Never true  ?Housing: Low Risk   ? Last Housing Risk Score: 0  ?Physical Activity: Insufficiently Active  ? Days of Exercise per Week: 3 days  ? Minutes of Exercise per Session: 40 min  ?Social Connections: Socially Isolated  ? Frequency of Communication with Friends and Family: Twice a week  ? Frequency of Social Gatherings with Friends and Family: Never  ? Attends Religious Services: Never  ? Active Member of Clubs or Organizations: No  ? Attends Archivist Meetings: Never  ? Marital Status: Divorced  ?Stress: No Stress Concern Present  ? Feeling of Stress : Not at all  ?Tobacco Use: Low Risk   ? Smoking Tobacco Use: Never  ? Smokeless Tobacco Use: Never  ? Passive Exposure: Not on file  ?Transportation Needs: Unknown  ? Lack of Transportation (Medical): Not on file  ? Lack of Transportation (Non-Medical): No  ? ? ?CCM Care Plan ? ?Allergies  ?Allergen Reactions  ? Ciprofloxacin Other (See Comments)  ?  PANIC ATTACKS (Fluoroquinolones) ?  ? Losartan Other (See Comments)  ?  Muscle pain, difficulty sleeping, anxiety, nausea  ? Metformin Other (See Comments)  ?  Bad acid reflux  ? Buprenorphine Hcl Nausea Only  ? Morphine And Related Nausea Only  ? Penicillins Rash  ?  Rash ?Has patient had a PCN reaction causing  immediate rash, facial/tongue/throat swelling, SOB or lightheadedness with hypotension: Unknown ?Has patient had a PCN reaction causing severe rash involving mucus membranes or skin necrosis: Unknown ?Has patient

## 2021-12-04 DIAGNOSIS — F32A Depression, unspecified: Secondary | ICD-10-CM

## 2021-12-04 DIAGNOSIS — I5030 Unspecified diastolic (congestive) heart failure: Secondary | ICD-10-CM

## 2021-12-04 DIAGNOSIS — M1909 Primary osteoarthritis, other specified site: Secondary | ICD-10-CM

## 2021-12-04 DIAGNOSIS — E785 Hyperlipidemia, unspecified: Secondary | ICD-10-CM | POA: Diagnosis not present

## 2021-12-06 ENCOUNTER — Other Ambulatory Visit: Payer: Self-pay | Admitting: Family Medicine

## 2021-12-06 MED ORDER — POTASSIUM CITRATE ER 10 MEQ (1080 MG) PO TBCR: 10.0000 meq | EXTENDED_RELEASE_TABLET | Freq: Two times a day (BID) | ORAL | 3 refills | Status: DC

## 2021-12-08 ENCOUNTER — Other Ambulatory Visit: Payer: Self-pay | Admitting: Family Medicine

## 2021-12-08 DIAGNOSIS — D509 Iron deficiency anemia, unspecified: Secondary | ICD-10-CM

## 2021-12-08 DIAGNOSIS — N138 Other obstructive and reflux uropathy: Secondary | ICD-10-CM

## 2021-12-08 DIAGNOSIS — I10 Essential (primary) hypertension: Secondary | ICD-10-CM

## 2021-12-08 DIAGNOSIS — E785 Hyperlipidemia, unspecified: Secondary | ICD-10-CM

## 2021-12-08 DIAGNOSIS — R7303 Prediabetes: Secondary | ICD-10-CM

## 2021-12-09 ENCOUNTER — Other Ambulatory Visit: Payer: Self-pay

## 2021-12-09 MED ORDER — CYCLOBENZAPRINE HCL 10 MG PO TABS: ORAL_TABLET | ORAL | 0 refills | Status: DC

## 2021-12-11 NOTE — Progress Notes (Signed)
PROVIDER NOTE: Information contained herein reflects review and annotations entered in association with encounter. Interpretation of such information and data should be left to medically-trained personnel. Information provided to patient can be located elsewhere in the medical record under "Patient Instructions". Document created using STT-dictation technology, any transcriptional errors that may result from process are unintentional.  ?  ?Patient: Mark [name]  Service Category: E/M  Provider: Oswaldo Done, MD  ?DOB: 27-Oct-1955  DOS: 12/12/2021  Specialty: Interventional Pain Management  ?MRN: [id number]  Setting: Ambulatory outpatient  PCP: [name], MD  ?Type: Established Patient    Referring Provider: [name], MD  ?Location: Office  Delivery: Face-to-face    ? ?HPI  ?Mr. [name], a 66 y.o. year old male, is here today because of his Chronic pain syndrome [G89.4]. Mr. Golson primary complain today is Shoulder Pain (Left ) and Hand Pain (Left ) ?Last encounter: My last encounter with him was on 09/21/2021. ?Pertinent problems: Mr. 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); Chronic sacroiliac joint pain (Bilateral); Osteoarthritis of sacroiliac joint (Bilateral); Lumbar facet hypertrophy (L1-2, L2-3, and L4-5) (Bilateral); Lumbar IVDD (intervertebral disc displacement); 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); Lumbar facet syndrome (Bilateral); Osteoarthritis of shoulder (Left); Osteoarthritis of knee (Bilateral) (R>L); Chronic cervical radicular pain; Chronic fatigue; RLS (restless legs syndrome); Trigger  finger of index finger (Right); Cervicalgia; Trigger finger of thumb (Right); Chronic thumb pain (Left); Trigger middle finger of right hand; Chronic hand pain (Left); Osteoarthritis of first carpometacarpal joint (thumb) of hand (Left); Trigger point with back pain (Left); Trigger point of shoulder region (scapula) (Left); History of laparoscopic cholecystectomy (10/27/2020); DDD (degenerative disc disease), cervical; Chronic low back pain (Bilateral) w/o sciatica; and Chronic shoulder pain (Bilateral) (L>R) on their pertinent problem list. ?Pain Assessment: Severity of Chronic pain is reported as a 5 /10. Location: Shoulder Left/pain into arm and down to the hand. Onset: More than a month ago. Quality: Discomfort, Constant. Timing: Constant. Modifying factor(s): medications help some.Marland Kitchen ?Vitals:  height is 5\' 7"  (1.702 m) and weight is 225 lb (102.1 kg). His temporal temperature is 98.2 ?F (36.8 ?C). His blood pressure is 120/78 and his pulse is 59 (abnormal). His respiration is 16 and oxygen saturation is 100%.  ? ?Reason for encounter: medication management.  The patient indicates doing well with the current medication regimen. No adverse reactions or side effects reported to the medications.  Pill count today shows that he has 144 pills left.  According to him he has been taking 2 to 3 tablets/day and not the 4 tablets/day that we had on his prescription.  Today we will be correcting this.  Based on the amount of pills that he currently has and calculating an average of 2.5 pills/day, he should have enough to last for approximately 57 days.  He refers that he has had to take more pills secondary to his left shoulder pain. ? ?Today he refers having a recurrence of his neck pain and shoulder pain.  He refers having significant decreased range of motion of the shoulder.  He was unable to abduct the shoulder to 90 degrees.  In addition to that, rotation of the cervical spine towards the affected shoulder trigger neck  pain  and shoulder pain suggesting cervical pathology as well.  The last time he had an evaluation of his cervical spine was not 2015.  I currently do not have any x-rays of that he is left shoulder under the [facility] system. ? ?UDS ordered today.  ? ?RTCB: 05/08/2022 ? ?Pharmacotherapy Assessment  ?Analgesic: Oxycodone IR 10 mg, 1 tab PO BID to TID (20-30 mg/day total of oxycodone) ?MME/day: 60 mg/day.  ? ?Monitoring: ?[place] PMP: PDMP reviewed during this encounter.       ?Pharmacotherapy: No side-effects or adverse reactions reported. ?Compliance: No problems identified. ?Effectiveness: Clinically acceptable. ? ?Vernie Ammons, RN  12/12/2021 12:24 PM  Sign when Signing Visit ?Nursing Pain Medication Assessment:  ?Safety precautions to be maintained throughout the outpatient stay will include: orient to surroundings, keep bed in low position, maintain call bell within reach at all times, provide assistance with transfer out of bed and ambulation.  ?Medication Inspection Compliance: Pill count conducted under aseptic conditions, in front of the patient. Neither the pills nor the bottle was removed from the patient's sight at any time. Once count was completed pills were immediately returned to the patient in their original bottle. ? ?Medication #1: Oxycodone IR ?Pill/Patch Count:  24 of 120 pills remain ?Pill/Patch Appearance: Markings consistent with prescribed medication ?Bottle Appearance: Standard pharmacy container. Clearly labeled. ?Filled Date: 04 / 04 / 2023 ?Last Medication intake:  Today ? ?Medication #2: Oxycodone IR ?Pill/Patch Count:  120 of 120 pills remain ?Pill/Patch Appearance: Markings consistent with prescribed medication ?Bottle Appearance: Standard pharmacy container. Clearly labeled. ?Filled Date: 05 / 02 / 2023 ?Last Medication intake:   has not started this bottle.  ?   UDS:  ?Summary  ?Date Value Ref Range Status  ?12/08/2020 Note  Final  ?  Comment:  ?   ==================================================================== ?[employer] Select 13 (MW) ?==================================================================== ?Test                             Result       Flag       Units ? ?Drug Present and Declared for Prescription Verification ?  Oxycodone                      1357         EXPECTED   ng/mg creat ?  Oxymorphone                    128          EXPECTED   ng/mg creat ?  Noroxycodone                   3434         EXPECTED   ng/mg creat ?   Sources of oxycodone include scheduled prescription medications. ?   Oxymorphone and noroxycodone are expected metabolites of oxycodone. ?   Oxymorphone is also available as a scheduled prescription medication. ? ?==================================================================== ?Test                      Result    Flag   Units      Ref Range ?  Creatinine              68               mg/dL      >=40 ?==================================================================== ?Declared Medications: ? The flagging and interpretation on this report  are based on the ? following declared medications.  Unexpected results may arise from ? inaccuracies in the declared medications. ? ? **Note: The testing scope of this panel includes these medications: ? ? Oxycodone ? ? **Note: The testing scope of this panel does not include the ? following reported medications: ? ? Aspirin ? Atorvastatin ? Cetirizine (Zyrtec) ? Cranberry ? Cyclobenzaprine (Flexeril) ? Dicyclomine (Bentyl) ? Diphenhydramine (Benadryl) ? Fluoxetine (Prozac) ? Fluticasone (Flonase) ? Iron ? Melatonin ? Multivitamin ? Naloxone (Narcan) ? Naproxen ? Omega-3 Fatty Acids ? Omeprazole ? Potassium ? Ropinirole ? Supplement ? Vitamin B ? Vitamin C ? Vitamin E ?==================================================================== ?For clinical consultation, please call [phone]. ?==================================================================== ?  ?  ? ?ROS   ?Constitutional: Denies any fever or chills ?Gastrointestinal: No reported hemesis, hematochezia, vomiting, or acute GI distress ?Musculoskeletal: Denies any acute onset joint swelling, redness, loss of ROM, or weakness ?Neurological: No reported epis

## 2021-12-12 ENCOUNTER — Other Ambulatory Visit: Payer: Self-pay

## 2021-12-12 ENCOUNTER — Ambulatory Visit: Payer: Medicare Other | Attending: Pain Medicine | Admitting: Pain Medicine

## 2021-12-12 ENCOUNTER — Other Ambulatory Visit (INDEPENDENT_AMBULATORY_CARE_PROVIDER_SITE_OTHER): Payer: Medicare Other

## 2021-12-12 ENCOUNTER — Encounter: Payer: Self-pay | Admitting: Pain Medicine

## 2021-12-12 VITALS — BP 120/78 | HR 59 | Temp 98.2°F | Resp 16 | Ht 67.0 in | Wt 225.0 lb

## 2021-12-12 DIAGNOSIS — M25511 Pain in right shoulder: Secondary | ICD-10-CM | POA: Diagnosis not present

## 2021-12-12 DIAGNOSIS — E785 Hyperlipidemia, unspecified: Secondary | ICD-10-CM | POA: Diagnosis not present

## 2021-12-12 DIAGNOSIS — M961 Postlaminectomy syndrome, not elsewhere classified: Secondary | ICD-10-CM

## 2021-12-12 DIAGNOSIS — M542 Cervicalgia: Secondary | ICD-10-CM

## 2021-12-12 DIAGNOSIS — G8929 Other chronic pain: Secondary | ICD-10-CM | POA: Diagnosis not present

## 2021-12-12 DIAGNOSIS — I10 Essential (primary) hypertension: Secondary | ICD-10-CM | POA: Diagnosis not present

## 2021-12-12 DIAGNOSIS — M4802 Spinal stenosis, cervical region: Secondary | ICD-10-CM

## 2021-12-12 DIAGNOSIS — N401 Enlarged prostate with lower urinary tract symptoms: Secondary | ICD-10-CM | POA: Diagnosis not present

## 2021-12-12 DIAGNOSIS — Z79899 Other long term (current) drug therapy: Secondary | ICD-10-CM | POA: Diagnosis not present

## 2021-12-12 DIAGNOSIS — D509 Iron deficiency anemia, unspecified: Secondary | ICD-10-CM | POA: Diagnosis not present

## 2021-12-12 DIAGNOSIS — R7303 Prediabetes: Secondary | ICD-10-CM | POA: Diagnosis not present

## 2021-12-12 DIAGNOSIS — Z79891 Long term (current) use of opiate analgesic: Secondary | ICD-10-CM | POA: Diagnosis not present

## 2021-12-12 DIAGNOSIS — M5412 Radiculopathy, cervical region: Secondary | ICD-10-CM | POA: Diagnosis not present

## 2021-12-12 DIAGNOSIS — M545 Low back pain, unspecified: Secondary | ICD-10-CM | POA: Insufficient documentation

## 2021-12-12 DIAGNOSIS — M533 Sacrococcygeal disorders, not elsewhere classified: Secondary | ICD-10-CM | POA: Insufficient documentation

## 2021-12-12 DIAGNOSIS — M25512 Pain in left shoulder: Secondary | ICD-10-CM | POA: Insufficient documentation

## 2021-12-12 DIAGNOSIS — G4486 Cervicogenic headache: Secondary | ICD-10-CM

## 2021-12-12 DIAGNOSIS — N138 Other obstructive and reflux uropathy: Secondary | ICD-10-CM

## 2021-12-12 DIAGNOSIS — M19012 Primary osteoarthritis, left shoulder: Secondary | ICD-10-CM

## 2021-12-12 DIAGNOSIS — M47816 Spondylosis without myelopathy or radiculopathy, lumbar region: Secondary | ICD-10-CM | POA: Diagnosis not present

## 2021-12-12 DIAGNOSIS — M47812 Spondylosis without myelopathy or radiculopathy, cervical region: Secondary | ICD-10-CM

## 2021-12-12 DIAGNOSIS — G894 Chronic pain syndrome: Secondary | ICD-10-CM | POA: Diagnosis not present

## 2021-12-12 DIAGNOSIS — Z950 Presence of cardiac pacemaker: Secondary | ICD-10-CM | POA: Diagnosis not present

## 2021-12-12 DIAGNOSIS — M4722 Other spondylosis with radiculopathy, cervical region: Secondary | ICD-10-CM | POA: Diagnosis present

## 2021-12-12 LAB — CBC WITH DIFFERENTIAL/PLATELET
Basophils Absolute: 0.1 10*3/uL (ref 0.0–0.1)
Basophils Relative: 1 % (ref 0.0–3.0)
Eosinophils Absolute: 0.2 10*3/uL (ref 0.0–0.7)
Eosinophils Relative: 2.5 % (ref 0.0–5.0)
HCT: 41.4 % (ref 39.0–52.0)
Hemoglobin: 14.5 g/dL (ref 13.0–17.0)
Lymphocytes Relative: 22.6 % (ref 12.0–46.0)
Lymphs Abs: 1.4 10*3/uL (ref 0.7–4.0)
MCHC: 35 g/dL (ref 30.0–36.0)
MCV: 88.7 fl (ref 78.0–100.0)
Monocytes Absolute: 0.8 10*3/uL (ref 0.1–1.0)
Monocytes Relative: 12.4 % — ABNORMAL HIGH (ref 3.0–12.0)
Neutro Abs: 3.9 10*3/uL (ref 1.4–7.7)
Neutrophils Relative %: 61.5 % (ref 43.0–77.0)
Platelets: 177 10*3/uL (ref 150.0–400.0)
RBC: 4.66 Mil/uL (ref 4.22–5.81)
RDW: 13.1 % (ref 11.5–15.5)
WBC: 6.3 10*3/uL (ref 4.0–10.5)

## 2021-12-12 LAB — BASIC METABOLIC PANEL
BUN: 20 mg/dL (ref 6–23)
CO2: 25 mEq/L (ref 19–32)
Calcium: 9.1 mg/dL (ref 8.4–10.5)
Chloride: 100 mEq/L (ref 96–112)
Creatinine, Ser: 0.9 mg/dL (ref 0.40–1.50)
GFR: 89.99 mL/min (ref >60.00)
Glucose, Bld: 243 mg/dL — ABNORMAL HIGH (ref 70–99)
Potassium: 3.8 mEq/L (ref 3.5–5.1)
Sodium: 138 mEq/L (ref 135–145)

## 2021-12-12 LAB — LIPID PANEL
Cholesterol: 129 mg/dL (ref 0–200)
HDL: 41.8 mg/dL (ref >39.00)
NonHDL: 86.83
Total CHOL/HDL Ratio: 3
Triglycerides: 222 mg/dL — ABNORMAL HIGH (ref 0.0–149.0)
VLDL: 44.4 mg/dL — ABNORMAL HIGH (ref 0.0–40.0)

## 2021-12-12 LAB — HEMOGLOBIN A1C: Hgb A1c MFr Bld: 7.8 % — ABNORMAL HIGH (ref 4.6–6.5)

## 2021-12-12 LAB — MICROALBUMIN / CREATININE URINE RATIO
Creatinine,U: 33.4 mg/dL
Microalb Creat Ratio: 3.8 mg/g (ref 0.0–30.0)
Microalb, Ur: 1.3 mg/dL (ref 0.0–1.9)

## 2021-12-12 LAB — IBC PANEL
Iron: 87 ug/dL (ref 42–165)
Saturation Ratios: 30.9 % (ref 20.0–50.0)
TIBC: 281.4 ug/dL (ref 250.0–450.0)
Transferrin: 201 mg/dL — ABNORMAL LOW (ref 212.0–360.0)

## 2021-12-12 LAB — LDL CHOLESTEROL, DIRECT: Direct LDL: 64 mg/dL

## 2021-12-12 MED ORDER — OXYCODONE HCL 10 MG PO TABS: 10.0000 mg | ORAL_TABLET | Freq: Three times a day (TID) | ORAL | 0 refills | Status: DC | PRN

## 2021-12-12 MED ORDER — OXYCODONE HCL 10 MG PO TABS: 10.0000 mg | ORAL_TABLET | Freq: Four times a day (QID) | ORAL | 0 refills | Status: DC | PRN

## 2021-12-12 NOTE — Progress Notes (Signed)
Nursing Pain Medication Assessment:  ?Safety precautions to be maintained throughout the outpatient stay will include: orient to surroundings, keep bed in low position, maintain call bell within reach at all times, provide assistance with transfer out of bed and ambulation.  ?Medication Inspection Compliance: Pill count conducted under aseptic conditions, in front of the patient. Neither the pills nor the bottle was removed from the patient's sight at any time. Once count was completed pills were immediately returned to the patient in their original bottle. ? ?Medication #1: Oxycodone IR ?Pill/Patch Count:  24 of 120 pills remain ?Pill/Patch Appearance: Markings consistent with prescribed medication ?Bottle Appearance: Standard pharmacy container. Clearly labeled. ?Filled Date: 04 / 04 / 2023 ?Last Medication intake:  Today ? ?Medication #2: Oxycodone IR ?Pill/Patch Count:  120 of 120 pills remain ?Pill/Patch Appearance: Markings consistent with prescribed medication ?Bottle Appearance: Standard pharmacy container. Clearly labeled. ?Filled Date: 05 / 02 / 2023 ?Last Medication intake:   has not started this bottle.  ?

## 2021-12-12 NOTE — Patient Instructions (Signed)
____________________________________________________________________________________________ ? ?Medication Rules ? ?Purpose: To inform patients, and their family members, of our rules and regulations. ? ?Applies to: All patients receiving prescriptions (written or electronic). ? ?Pharmacy of record: Pharmacy where electronic prescriptions will be sent. If written prescriptions are taken to a different pharmacy, please inform the nursing staff. The pharmacy listed in the electronic medical record should be the one where you would like electronic prescriptions to be sent. ? ?Electronic prescriptions: In compliance with the [place] Strengthen Opioid Misuse Prevention (STOP) Act of 2017 (Session Law 2017-74/H243), effective August 07, 2018, all controlled substances must be electronically prescribed. Calling prescriptions to the pharmacy will cease to exist. ? ?Prescription refills: Only during scheduled appointments. Applies to all prescriptions. ? ?NOTE: The following applies primarily to controlled substances (Opioid* Pain Medications).  ? ?Type of encounter (visit): For patients receiving controlled substances, face-to-face visits are required. (Not an option or up to the patient.) ? ?Patient's responsibilities: ?Pain Pills: Bring all pain pills to every appointment (except for procedure appointments). ?Pill Bottles: Bring pills in original pharmacy bottle. Always bring the newest bottle. Bring bottle, even if empty. ?Medication refills: You are responsible for knowing and keeping track of what medications you take and those you need refilled. ?The day before your appointment: write a list of all prescriptions that need to be refilled. ?The day of the appointment: give the list to the admitting nurse. Prescriptions will be written only during appointments. No prescriptions will be written on procedure days. ?If you forget a medication: it will not be "Called in", "Faxed", or "electronically sent". You will  need to get another appointment to get these prescribed. ?No early refills. Do not call asking to have your prescription filled early. ?Prescription Accuracy: You are responsible for carefully inspecting your prescriptions before leaving our office. Have the discharge nurse carefully go over each prescription with you, before taking them home. Make sure that your name is accurately spelled, that your address is correct. Check the name and dose of your medication to make sure it is accurate. Check the number of pills, and the written instructions to make sure they are clear and accurate. Make sure that you are given enough medication to last until your next medication refill appointment. ?Taking Medication: Take medication as prescribed. When it comes to controlled substances, taking less pills or less frequently than prescribed is permitted and encouraged. ?Never take more pills than instructed. ?Never take medication more frequently than prescribed.  ?Inform other Doctors: Always inform, all of your healthcare providers, of all the medications you take. ?Pain Medication from other Providers: You are not allowed to accept any additional pain medication from any other Doctor or Healthcare provider. There are two exceptions to this rule. (see below) In the event that you require additional pain medication, you are responsible for notifying us, as stated below. ?Cough Medicine: Often these contain an opioid, such as codeine or hydrocodone. Never accept or take cough medicine containing these opioids if you are already taking an opioid* medication. The combination may cause respiratory failure and death. ?Medication Agreement: You are responsible for carefully reading and following our Medication Agreement. This must be signed before receiving any prescriptions from our practice. Safely store a copy of your signed Agreement. Violations to the Agreement will result in no further prescriptions. (Additional copies of our  Medication Agreement are available upon request.) ?Laws, Rules, & Regulations: All patients are expected to follow all Federal and State Laws, Statutes, Rules, & Regulations. Ignorance of   the Laws does not constitute a valid excuse.  ?Illegal drugs and Controlled Substances: The use of illegal substances (including, but not limited to marijuana and its derivatives) and/or the illegal use of any controlled substances is strictly prohibited. Violation of this rule may result in the immediate and permanent discontinuation of any and all prescriptions being written by our practice. The use of any illegal substances is prohibited. ?Adopted CDC guidelines & recommendations: Target dosing levels will be at or below 60 MME/day. Use of benzodiazepines** is not recommended. ? ?Exceptions: There are only two exceptions to the rule of not receiving pain medications from other Healthcare Providers. ?Exception #1 (Emergencies): In the event of an emergency (i.e.: accident requiring emergency care), you are allowed to receive additional pain medication. However, you are responsible for: As soon as you are able, call our office [phone], at any time of the day or night, and leave a message stating your name, the date and nature of the emergency, and the name and dose of the medication prescribed. In the event that your call is answered by a member of our staff, make sure to document and save the date, time, and the name of the person that took your information.  ?Exception #2 (Planned Surgery): In the event that you are scheduled by another doctor or dentist to have any type of surgery or procedure, you are allowed (for a period no longer than 30 days), to receive additional pain medication, for the acute post-op pain. However, in this case, you are responsible for picking up a copy of our "Post-op Pain Management for Surgeons" handout, and giving it to your surgeon or dentist. This document is available at our office, and  does not require an appointment to obtain it. Simply go to our office during business hours (Monday-Thursday from 8:00 AM to 4:00 PM) (Friday 8:00 AM to 12:00 Noon) or if you have a scheduled appointment with us, prior to your surgery, and ask for it by name. In addition, you are responsible for: calling our office [phone], at any time of the day or night, and leaving a message stating your name, name of your surgeon, type of surgery, and date of procedure or surgery. Failure to comply with your responsibilities may result in termination of therapy involving the controlled substances. ?Medication Agreement Violation. Following the above rules, including your responsibilities will help you in avoiding a Medication Agreement Violation (?Breaking your Pain Medication Contract?). ? ?*Opioid medications include: morphine, codeine, oxycodone, oxymorphone, hydrocodone, hydromorphone, meperidine, tramadol, tapentadol, buprenorphine, fentanyl, methadone. ?**Benzodiazepine medications include: diazepam (Valium), alprazolam (Xanax), clonazepam (Klonopine), lorazepam (Ativan), clorazepate (Tranxene), chlordiazepoxide (Librium), estazolam (Prosom), oxazepam (Serax), temazepam (Restoril), triazolam (Halcion) ?(Last updated: 05/04/2021) ?____________________________________________________________________________________________ ? ____________________________________________________________________________________________ ? ?Medication Recommendations and Reminders ? ?Applies to: All patients receiving prescriptions (written and/or electronic). ? ?Medication Rules & Regulations: These rules and regulations exist for your safety and that of others. They are not flexible and neither are we. Dismissing or ignoring them will be considered "non-compliance" with medication therapy, resulting in complete and irreversible termination of such therapy. (See document titled "Medication Rules" for more details.) In all conscience,  because of safety reasons, we cannot continue providing a therapy where the patient does not follow instructions. ? ?Pharmacy of record:  ?Definition: This is the pharmacy where your electronic prescriptions w

## 2021-12-13 LAB — FERRITIN: Ferritin: 505.3 ng/mL — ABNORMAL HIGH (ref 22.0–322.0)

## 2021-12-13 LAB — TSH: TSH: 4.26 u[IU]/mL (ref 0.35–5.50)

## 2021-12-13 LAB — PSA: PSA: 0.59 ng/mL (ref 0.10–4.00)

## 2021-12-17 LAB — TOXASSURE SELECT 13 (MW), URINE

## 2021-12-19 ENCOUNTER — Encounter: Payer: Self-pay | Admitting: Family Medicine

## 2021-12-19 ENCOUNTER — Ambulatory Visit (INDEPENDENT_AMBULATORY_CARE_PROVIDER_SITE_OTHER): Payer: Medicare Other | Admitting: Family Medicine

## 2021-12-19 ENCOUNTER — Telehealth: Payer: Self-pay | Admitting: Family Medicine

## 2021-12-19 VITALS — BP 136/72 | HR 69 | Temp 98.0°F | Ht 66.75 in | Wt 222.2 lb

## 2021-12-19 DIAGNOSIS — Z0000 Encounter for general adult medical examination without abnormal findings: Secondary | ICD-10-CM | POA: Diagnosis not present

## 2021-12-19 DIAGNOSIS — I10 Essential (primary) hypertension: Secondary | ICD-10-CM

## 2021-12-19 DIAGNOSIS — G4733 Obstructive sleep apnea (adult) (pediatric): Secondary | ICD-10-CM

## 2021-12-19 DIAGNOSIS — E785 Hyperlipidemia, unspecified: Secondary | ICD-10-CM

## 2021-12-19 DIAGNOSIS — E1169 Type 2 diabetes mellitus with other specified complication: Secondary | ICD-10-CM

## 2021-12-19 DIAGNOSIS — N200 Calculus of kidney: Secondary | ICD-10-CM

## 2021-12-19 DIAGNOSIS — Z950 Presence of cardiac pacemaker: Secondary | ICD-10-CM

## 2021-12-19 DIAGNOSIS — D509 Iron deficiency anemia, unspecified: Secondary | ICD-10-CM

## 2021-12-19 DIAGNOSIS — Z0001 Encounter for general adult medical examination with abnormal findings: Secondary | ICD-10-CM

## 2021-12-19 DIAGNOSIS — R195 Other fecal abnormalities: Secondary | ICD-10-CM

## 2021-12-19 DIAGNOSIS — G2581 Restless legs syndrome: Secondary | ICD-10-CM

## 2021-12-19 DIAGNOSIS — K219 Gastro-esophageal reflux disease without esophagitis: Secondary | ICD-10-CM

## 2021-12-19 MED ORDER — AMLODIPINE BESYLATE 2.5 MG PO TABS
2.5000 mg | ORAL_TABLET | Freq: Every day | ORAL | 3 refills | Status: DC
Start: 2021-12-19 — End: 2022-10-27

## 2021-12-19 MED ORDER — ROPINIROLE HCL 2 MG PO TABS: 2.0000 mg | ORAL_TABLET | Freq: Every day | ORAL | 3 refills | Status: DC

## 2021-12-19 MED ORDER — METFORMIN HCL ER 500 MG PO TB24: 500.0000 mg | ORAL_TABLET | Freq: Every day | ORAL | 6 refills | Status: DC

## 2021-12-19 MED ORDER — OMEPRAZOLE 20 MG PO CPDR: 20.0000 mg | DELAYED_RELEASE_CAPSULE | Freq: Every day | ORAL | Status: DC

## 2021-12-19 NOTE — Assessment & Plan Note (Signed)
Encouraged healthy diet and lifestyle choices to affect sustainable weight loss.  ?

## 2021-12-19 NOTE — Assessment & Plan Note (Signed)
Appreciate uro care. Continues potassium citrate BID ?

## 2021-12-19 NOTE — Assessment & Plan Note (Signed)
Preventative protocols reviewed and updated unless pt declined. Discussed healthy diet and lifestyle.  

## 2021-12-19 NOTE — Assessment & Plan Note (Signed)
Stable period on omeprazole 20mg daily.  

## 2021-12-19 NOTE — Telephone Encounter (Signed)
Lvm asking pt to call back.  Neet to relay Dr. Timoteo Expose message.  ?

## 2021-12-19 NOTE — Assessment & Plan Note (Signed)
Chronic, stable period on atorvastatin with good LDL control. Discussed triglyceride elevation likely driven by hyperglycemia.  ?The ASCVD Risk score (Arnett DK, et al., 2019) failed to calculate for the following reasons: ?  The valid total cholesterol range is 130 to 320 mg/dL  ?

## 2021-12-19 NOTE — Assessment & Plan Note (Signed)
No longer anemic. He continues oral iron daily. ?Transferrin levels low - pointing against iron def.  ?

## 2021-12-19 NOTE — Telephone Encounter (Signed)
At OV today ?I gave pt # for [facility] for him to call and schedule colonoscopy however referral was previously placed to [facility] Dr Norma Fredrickson.  ?Plz ask him to contact [facility] to schedule colonoscopy.  ?

## 2021-12-19 NOTE — Assessment & Plan Note (Signed)
Had difficulty tolerating BiPAP mask.  ?

## 2021-12-19 NOTE — Assessment & Plan Note (Addendum)
New diagnosis with A1c up to 7.8%.  ?Discussed this. ?rec start metformin XR 500mg  once daily. H/o intolerance to metformin IR.  ?Update with effect. ?RTC 3 mo DM f/u visit.  ?

## 2021-12-19 NOTE — Progress Notes (Signed)
? ? Patient ID: Mark [name], male    DOB: November 30, 1955, 66 y.o.   MRN: [id number] ? ?This visit was conducted in person. ? ?BP 136/72   Pulse 69   Temp 98 ?F (36.7 ?C) (Temporal)   Ht 5' 6.75" (1.695 m)   Wt 222 lb 4 oz (100.8 kg)   SpO2 97%   BMI 35.07 kg/m?   ? ?CC: CPE ?Subjective:  ? ?HPI: ?Mark [name] is a 66 y.o. male presenting on 12/19/2021 for Annual Exam [facility] prt 2. Provided log of recent home BP readings. ) ? ? ?Saw health advisor 10/2021 for medicare wellness visit. Note reviewed. Normal 6CIT score.  ? ?No results found.  ?Flowsheet Row Clinical Support from 10/31/2021 in [facility]  ?PHQ-2 Total Score 2  ? ?  ?  ? ?  12/12/2021  ? 12:17 PM 10/31/2021  ? 11:21 AM 06/22/2021  ?  9:13 AM 03/23/2021  ?  8:10 AM 12/08/2020  ? 12:53 PM  ?Fall Risk   ?Falls in the past year? 0 1 1 0 0  ?Number falls in past yr:  1 0  0  ?Comment   tripped and fell while walking dog and busted his nose.    ?Injury with Fall?  1 0    ?Comment  walking dog tripped over feet busted nose did not seek medical attention.  just bloody nose.    ?Risk for fall due to : No Fall Risks  History of fall(s)  No Fall Risks  ?Follow up Falls evaluation completed Falls evaluation completed;Education provided;Falls prevention discussed Falls evaluation completed;Education provided  Falls evaluation completed  ? ?HTN - brings BP log showing 110-150s/60-80s at lunch, 115-140s/60-80s in evenings.  ? ?Chronic pain - manages with opiate through pain clinic as well as naprosyn 500mg  daily with breakfast. Noted bleeding risk with NSAID + SSRI - he continues prilosec 20mg  daily. Upcoming L shoulder CT and cervical neck CT. Notes some limitation in ROM with L shoulder abduction.  ? ?On prozac 60mg  total.  ? ?S/p 2nd pacemaker 12/2017, sees cardiology Dr Ubaldo Glassing regularly.  ? ?Notes ongoing PNdrainage, to the point of causing nausea, dry heaves the next morning.  ?  ?Preventative: ?COLONOSCOPY 07/2007 prostate nodule, hemorrhoid, rpt 5  yrs [name]). ?Cologuard positive 08/2017 - colonoscopy postponed for the past 2 years. Endorses h/o hemorrhoids. Needs to schedule GI f/u.  ?Prostate cancer screening with nodule - sees urology Dr Bernardo Heater, PSA through our office.  ?Lung cancer screening - not indicated ?Flu shot yearly  ?COVID vaccine Moderna 11/2019 x2, booster 07/2020, bivalent 04/2021.  ?Pneumovax 2007  ?Tdap 11/2009, 04/2020  ?Shingrix - discussed, to check with pharmacist  ?Advanced directive discussion - has this at home - HCPOA would be Carlos Levering (lifelong friend). Previously asked to bring [place] a copy.  ?Seat belt use discussed  ?Sunscreen use discussed, no changing moles on skin.  ?Non smoker  ?Alcohol - none  ?Dentist q6 mo  ?Eye exam ~q2 yrs, recent new glasses ?Bowel - no diarrhea/constipation ?Bladder - no incontinence ? ?Divorced. GF passed away. Lives alone, mother lives next door. ?Edu: 8th grade ?Occ: retired, disability for chronic neck pain  ?Activity: no regular exercise - limited by back and knee pain ?Diet: increasing water, poor vegetables and fruits ?   ? ?Relevant past medical, surgical, family and social history reviewed and updated as indicated. Interim medical history since our last visit reviewed. ?Allergies and medications reviewed and updated. ?Outpatient  Medications Prior to Visit  ?Medication Sig Dispense Refill  ? Ascorbic Acid (VITAMIN C) 1000 MG tablet Take 1,000 mg by mouth daily.    ? aspirin 81 MG tablet Take 81 mg by mouth daily.    ? atorvastatin (LIPITOR) 20 MG tablet Take 1 tablet (20 mg total) by mouth at bedtime. 90 tablet 0  ? cetirizine (ZYRTEC) 10 MG tablet Take 10 mg by mouth at bedtime.     ? clindamycin (CLEOCIN) 150 MG capsule Take 150 mg by mouth as needed. Prior to dental appt    ? cyclobenzaprine (FLEXERIL) 10 MG tablet TAKE ONE-HALF TO ONE TABLET BY MOUTH 2 TIMES DAILY AS NEEDED FOR MUSCLE SPASMS 40 tablet 0  ? ferrous sulfate 325 (65 FE) MG EC tablet Take 325 mg by mouth daily with breakfast.    ?  FLUoxetine (PROZAC) 20 MG capsule Take 1 capsule (20 mg total) by mouth daily. 90 capsule 4  ? FLUoxetine (PROZAC) 40 MG capsule Take 1 capsule (40 mg total) by mouth daily. 90 capsule 4  ? fluticasone (FLONASE) 50 MCG/ACT nasal spray Place 2 sprays into both nostrils daily. 48 g 4  ? furosemide (LASIX) 20 MG tablet Take 1 tablet (20 mg total) by mouth daily. 90 tablet 4  ? Multiple Vitamins-Minerals (CERTAVITE SENIOR/ANTIOXIDANT) TABS Take 1 tablet by mouth daily.    ? naloxone (NARCAN) nasal spray 4 mg/0.1 mL Place 1 spray into the nose as needed for up to 365 doses (for opioid-induced respiratory depresssion). In case of emergency (overdose), spray once into each nostril. If no response within 3 minutes, repeat application and call [id number]. 1 each 0  ? naproxen (NAPROSYN) 500 MG tablet Take 1 tablet (500 mg total) by mouth daily.    ? [START ON 02/07/2022] Oxycodone HCl 10 MG TABS Take 1 tablet (10 mg total) by mouth every 8 (eight) hours as needed. Each refill must last 30 days. 90 tablet 0  ? [START ON 03/09/2022] Oxycodone HCl 10 MG TABS Take 1 tablet (10 mg total) by mouth every 8 (eight) hours as needed. Each refill must last 30 days. 90 tablet 0  ? [START ON 04/08/2022] Oxycodone HCl 10 MG TABS Take 1 tablet (10 mg total) by mouth every 8 (eight) hours as needed. Each refill must last 30 days. 90 tablet 0  ? potassium citrate (UROCIT-K) 10 MEQ (1080 MG) SR tablet Take 1 tablet (10 mEq total) by mouth 2 (two) times daily. 180 tablet 3  ? amLODipine (NORVASC) 2.5 MG tablet Take 1 tablet (2.5 mg total) by mouth daily. 30 tablet 11  ? omeprazole (PRILOSEC) 20 MG capsule Take 1 capsule (20 mg total) by mouth daily as needed (GERD). 90 capsule 0  ? rOPINIRole (REQUIP) 2 MG tablet Take 1 tablet (2 mg total) by mouth at bedtime. 90 tablet 3  ? Cholecalciferol (VITAMIN D) 2000 units CAPS Take 2,000 Units by mouth daily.     ? CRANBERRY PO Take 4,200 mg by mouth at bedtime.     ? Garlic 123XX123 MG CAPS Take 1,000 mg by mouth  daily.    ? ?No facility-administered medications prior to visit.  ?  ? ?Per HPI unless specifically indicated in ROS section below ?Review of Systems  ?Constitutional:  Negative for activity change, appetite change, chills, fatigue, fever and unexpected weight change.  ?HENT:  Negative for hearing loss.   ?Eyes:  Negative for visual disturbance.  ?Respiratory:  Positive for shortness of breath (chronic exertional). Negative for  cough, chest tightness and wheezing.   ?Cardiovascular:  Positive for leg swelling. Negative for chest pain and palpitations.  ?Gastrointestinal:  Positive for blood in stool (h/o hemorrhoids), nausea and vomiting. Negative for abdominal distention, abdominal pain, constipation and diarrhea.  ?Genitourinary:  Negative for difficulty urinating and hematuria.  ?Musculoskeletal:  Positive for arthralgias, back pain and neck pain. Negative for myalgias.  ?Skin:  Negative for rash.  ?Neurological:  Negative for dizziness (some orthostatic), seizures, syncope and headaches.  ?Hematological:  Negative for adenopathy. Does not bruise/bleed easily.  ?Psychiatric/Behavioral:  Negative for dysphoric mood. The patient is not nervous/anxious.   ? ?Objective:  ?BP 136/72   Pulse 69   Temp 98 ?F (36.7 ?C) (Temporal)   Ht 5' 6.75" (1.695 m)   Wt 222 lb 4 oz (100.8 kg)   SpO2 97%   BMI 35.07 kg/m?   ?Wt Readings from Last 3 Encounters:  ?12/19/21 222 lb 4 oz (100.8 kg)  ?12/12/21 225 lb (102.1 kg)  ?10/05/21 225 lb 6 oz (102.2 kg)  ?  ?  ?Physical Exam ?Vitals and nursing note reviewed.  ?Constitutional:   ?   General: He is not in acute distress. ?   Appearance: Normal appearance. He is well-developed. He is not ill-appearing.  ?HENT:  ?   Head: Normocephalic and atraumatic.  ?   Right Ear: Hearing, tympanic membrane, ear canal and external ear normal.  ?   Left Ear: Hearing, tympanic membrane, ear canal and external ear normal.  ?Eyes:  ?   General: No scleral icterus. ?   Extraocular Movements:  Extraocular movements intact.  ?   Conjunctiva/sclera: Conjunctivae normal.  ?   Pupils: Pupils are equal, round, and reactive to light.  ?Neck:  ?   Thyroid: No thyroid mass or thyromegaly.  ?Cardiovascular:  ?   Rate a

## 2021-12-19 NOTE — Assessment & Plan Note (Signed)
Overdue for GI f/u for positive Cologuard. Advised call to schedule appt with [facility].  ?

## 2021-12-19 NOTE — Assessment & Plan Note (Addendum)
Stable period on requip 2mg  nightly. He also continues oral iron daily.  ?

## 2021-12-19 NOTE — Assessment & Plan Note (Signed)
Chronic, stable, overall good control as evidenced by BP log he brings in. He feels losartan intolerance was not true allergy but possibly related to different cause and would be willing to try again.  ?

## 2021-12-19 NOTE — Patient Instructions (Addendum)
Call and reschedule appointment with [facility] at [phone].  ?If interested, check with pharmacy about new 2 shot shingles series (shingrix).  ?Start metformin XR 500mg  once daily with breakfast. Let me know if any trouble tolerating this.  ?Good to see you today.  ?Return in 3 months for diabetes follow up visit.  ? ?Diabetes Mellitus and Nutrition, Adult ?When you have diabetes, or diabetes mellitus, it is very important to have healthy eating habits because your blood sugar (glucose) levels are greatly affected by what you eat and drink. Eating healthy foods in the right amounts, at about the same times every day, can help you: ?Manage your blood glucose. ?Lower your risk of heart disease. ?Improve your blood pressure. ?Reach or maintain a healthy weight. ?What can affect my meal plan? ?Every person with diabetes is different, and each person has different needs for a meal plan. Your health care provider may recommend that you work with a dietitian to make a meal plan that is best for you. Your meal plan may vary depending on factors such as: ?The calories you need. ?The medicines you take. ?Your weight. ?Your blood glucose, blood pressure, and cholesterol levels. ?Your activity level. ?Other health conditions you have, such as heart or kidney disease. ?How do carbohydrates affect me? ?Carbohydrates, also called carbs, affect your blood glucose level more than any other type of food. Eating carbs raises the amount of glucose in your blood. ?It is important to know how many carbs you can safely have in each meal. This is different for every person. Your dietitian can help you calculate how many carbs you should have at each meal and for each snack. ?How does alcohol affect me? ?Alcohol can cause a decrease in blood glucose (hypoglycemia), especially if you use insulin or take certain diabetes medicines by mouth. Hypoglycemia can be a life-threatening condition. Symptoms of hypoglycemia, such as sleepiness,  dizziness, and confusion, are similar to symptoms of having too much alcohol. ?Do not drink alcohol if: ?Your health care provider tells you not to drink. ?You are pregnant, may be pregnant, or are planning to become pregnant. ?If you drink alcohol: ?Limit how much you have to: ?0-1 drink a day for women. ?0-2 drinks a day for men. ?Know how much alcohol is in your drink. In the [place] one drink equals one 12 oz bottle of beer (355 mL), one 5 oz glass of wine (148 mL), or one 1? oz glass of hard liquor (44 mL). ?Keep yourself hydrated with water, diet soda, or unsweetened iced tea. Keep in mind that regular soda, juice, and other mixers may contain a lot of sugar and must be counted as carbs. ?What are tips for following this plan? ? ?Reading food labels ?Start by checking the serving size on the Nutrition Facts label of packaged foods and drinks. The number of calories and the amount of carbs, fats, and other nutrients listed on the label are based on one serving of the item. Many items contain more than one serving per package. ?Check the total grams (g) of carbs in one serving. ?Check the number of grams of saturated fats and trans fats in one serving. Choose foods that have a low amount or none of these fats. ?Check the number of milligrams (mg) of salt (sodium) in one serving. Most people should limit total sodium intake to less than 2,300 mg per day. ?Always check the nutrition information of foods labeled as "low-fat" or "nonfat." These foods may be higher  in added sugar or refined carbs and should be avoided. ?Talk to your dietitian to identify your daily goals for nutrients listed on the label. ?Shopping ?Avoid buying canned, pre-made, or processed foods. These foods tend to be high in fat, sodium, and added sugar. ?Shop around the outside edge of the grocery store. This is where you will most often find fresh fruits and vegetables, bulk grains, fresh meats, and fresh dairy products. ?Cooking ?Use low-heat  cooking methods, such as baking, instead of high-heat cooking methods, such as deep frying. ?Cook using healthy oils, such as olive, canola, or sunflower oil. ?Avoid cooking with butter, cream, or high-fat meats. ?Meal planning ?Eat meals and snacks regularly, preferably at the same times every day. Avoid going long periods of time without eating. ?Eat foods that are high in fiber, such as fresh fruits, vegetables, beans, and whole grains. ?Eat 4-6 oz (112-168 g) of lean protein each day, such as lean meat, chicken, fish, eggs, or tofu. One ounce (oz) (28 g) of lean protein is equal to: ?1 oz (28 g) of meat, chicken, or fish. ?1 egg. ?? cup (62 g) of tofu. ?Eat some foods each day that contain healthy fats, such as avocado, nuts, seeds, and fish. ?What foods should I eat? ?Fruits ?Berries. Apples. Oranges. Peaches. Apricots. Plums. Grapes. Mangoes. Papayas. Pomegranates. Kiwi. Cherries. ?Vegetables ?Leafy greens, including lettuce, spinach, kale, chard, collard greens, mustard greens, and cabbage. Beets. Cauliflower. Broccoli. Carrots. Green beans. Tomatoes. Peppers. Onions. Cucumbers. Brussels sprouts. ?Grains ?Whole grains, such as whole-wheat or whole-grain bread, crackers, tortillas, cereal, and pasta. Unsweetened oatmeal. Quinoa. Brown or wild rice. ?Meats and other proteins ?Seafood. Poultry without skin. Lean cuts of poultry and beef. Tofu. Nuts. Seeds. ?Dairy ?Low-fat or fat-free dairy products such as milk, yogurt, and cheese. ?The items listed above may not be a complete list of foods and beverages you can eat and drink. Contact a dietitian for more information. ?What foods should I avoid? ?Fruits ?Fruits canned with syrup. ?Vegetables ?Canned vegetables. Frozen vegetables with butter or cream sauce. ?Grains ?Refined white flour and flour products such as bread, pasta, snack foods, and cereals. Avoid all processed foods. ?Meats and other proteins ?Fatty cuts of meat. Poultry with skin. Breaded or fried  meats. Processed meat. Avoid saturated fats. ?Dairy ?Full-fat yogurt, cheese, or milk. ?Beverages ?Sweetened drinks, such as soda or iced tea. ?The items listed above may not be a complete list of foods and beverages you should avoid. Contact a dietitian for more information. ?Questions to ask a health care provider ?Do I need to meet with a certified diabetes care and education specialist? ?Do I need to meet with a dietitian? ?What number can I call if I have questions? ?When are the best times to check my blood glucose? ?Where to find more information: ?American Diabetes Association: diabetes.org ?Academy of Nutrition and Dietetics: eatright.org ?[employer] of Diabetes and Digestive and Kidney Diseases: StageSync.si ?Association of Diabetes Care & Education Specialists: diabeteseducator.org ?Summary ?It is important to have healthy eating habits because your blood sugar (glucose) levels are greatly affected by what you eat and drink. It is important to use alcohol carefully. ?A healthy meal plan will help you manage your blood glucose and lower your risk of heart disease. ?Your health care provider may recommend that you work with a dietitian to make a meal plan that is best for you. ?This information is not intended to replace advice given to you by your health care provider. Make sure you discuss  any questions you have with your health care provider. ?Document Revised: 02/25/2020 Document Reviewed: 02/25/2020 ?Elsevier Patient Education ? 2023 Elsevier Inc. ? ?Health Maintenance, Male ?Adopting a healthy lifestyle and getting preventive care are important in promoting health and wellness. Ask your health care provider about: ?The right schedule for you to have regular tests and exams. ?Things you can do on your own to prevent diseases and keep yourself healthy. ?What should I know about diet, weight, and exercise? ?Eat a healthy diet ? ?Eat a diet that includes plenty of vegetables, fruits, low-fat  dairy products, and lean protein. ?Do not eat a lot of foods that are high in solid fats, added sugars, or sodium. ?Maintain a healthy weight ?Body mass index (BMI) is a measurement that can be used to [employer]

## 2021-12-20 NOTE — Telephone Encounter (Signed)
Patient notified as instructed by telephone and verbalized understanding. 

## 2021-12-26 ENCOUNTER — Telehealth: Payer: Self-pay

## 2021-12-26 NOTE — Telephone Encounter (Signed)
Insurance Treatment Denial Note  Date order was entered:  Order entered by: [name], MD Requested treatment: CT Cspine and shoulder Reason for denial: Documentation of Physical Therapy is missing. Recommended for approval:  NO PT, home exercise or 6 weeks therapy

## 2021-12-28 ENCOUNTER — Other Ambulatory Visit: Payer: Self-pay

## 2021-12-28 DIAGNOSIS — G894 Chronic pain syndrome: Secondary | ICD-10-CM

## 2021-12-28 NOTE — Telephone Encounter (Signed)
Faxed refill request from [facility] for naproxen.  Last OV:  12/19/21, CPE Next OV:  03/27/22, 3 mo DM f/u

## 2021-12-29 MED ORDER — NAPROXEN 500 MG PO TABS: 500.0000 mg | ORAL_TABLET | Freq: Every day | ORAL | 1 refills | Status: DC

## 2021-12-29 NOTE — Telephone Encounter (Signed)
ERx 

## 2022-01-06 ENCOUNTER — Telehealth: Payer: Self-pay

## 2022-01-06 NOTE — Progress Notes (Signed)
Chronic Care Management Pharmacy Assistant   Name: Mark [name]  MRN: [id number] DOB: 21-Apr-1956  Reason for Encounter: CCM (Hypertension Disease State)   Recent office visits:  12/19/21 [name], MD Annual Exam: Start: metFORMIN (GLUCOPHAGE-XR) 500 MG 24 hr tablet Change: omeprazole (PRILOSEC) 20 MG capsule daily vs PRN Stop all due to patient preference: Cholecalciferol (VITAMIN D) 2000 units CAPS, CRANBERRY PO and Garlic 1000 MG CAPS. FU 3 months  Recent consult visits:  12/12/21 [name], MD Chronic Pain: No med changes F/U 21 weeks  Hospital visits:  None in previous 6 months  Medications: Outpatient Encounter Medications as of 01/06/2022  Medication Sig   amLODipine (NORVASC) 2.5 MG tablet Take 1 tablet (2.5 mg total) by mouth daily.   Ascorbic Acid (VITAMIN C) 1000 MG tablet Take 1,000 mg by mouth daily.   aspirin 81 MG tablet Take 81 mg by mouth daily.   atorvastatin (LIPITOR) 20 MG tablet Take 1 tablet (20 mg total) by mouth at bedtime.   cetirizine (ZYRTEC) 10 MG tablet Take 10 mg by mouth at bedtime.    clindamycin (CLEOCIN) 150 MG capsule Take 150 mg by mouth as needed. Prior to dental appt   cyclobenzaprine (FLEXERIL) 10 MG tablet TAKE ONE-HALF TO ONE TABLET BY MOUTH 2 TIMES DAILY AS NEEDED FOR MUSCLE SPASMS   ferrous sulfate 325 (65 FE) MG EC tablet Take 325 mg by mouth daily with breakfast.   FLUoxetine (PROZAC) 20 MG capsule Take 1 capsule (20 mg total) by mouth daily.   FLUoxetine (PROZAC) 40 MG capsule Take 1 capsule (40 mg total) by mouth daily.   fluticasone (FLONASE) 50 MCG/ACT nasal spray Place 2 sprays into both nostrils daily.   furosemide (LASIX) 20 MG tablet Take 1 tablet (20 mg total) by mouth daily.   metFORMIN (GLUCOPHAGE-XR) 500 MG 24 hr tablet Take 1 tablet (500 mg total) by mouth daily with breakfast.   Multiple Vitamins-Minerals (CERTAVITE SENIOR/ANTIOXIDANT) TABS Take 1 tablet by mouth daily.   naloxone (NARCAN) nasal spray 4  mg/0.1 mL Place 1 spray into the nose as needed for up to 365 doses (for opioid-induced respiratory depresssion). In case of emergency (overdose), spray once into each nostril. If no response within 3 minutes, repeat application and call 911.   naproxen (NAPROSYN) 500 MG tablet Take 1 tablet (500 mg total) by mouth daily.   omeprazole (PRILOSEC) 20 MG capsule Take 1 capsule (20 mg total) by mouth daily.   [START ON 02/07/2022] Oxycodone HCl 10 MG TABS Take 1 tablet (10 mg total) by mouth every 8 (eight) hours as needed. Each refill must last 30 days.   [START ON 03/09/2022] Oxycodone HCl 10 MG TABS Take 1 tablet (10 mg total) by mouth every 8 (eight) hours as needed. Each refill must last 30 days.   [START ON 04/08/2022] Oxycodone HCl 10 MG TABS Take 1 tablet (10 mg total) by mouth every 8 (eight) hours as needed. Each refill must last 30 days.   potassium citrate (UROCIT-K) 10 MEQ (1080 MG) SR tablet Take 1 tablet (10 mEq total) by mouth 2 (two) times daily.   rOPINIRole (REQUIP) 2 MG tablet Take 1 tablet (2 mg total) by mouth at bedtime.   No facility-administered encounter medications on file as of 01/06/2022.   Recent Office Vitals: BP Readings from Last 3 Encounters:  12/19/21 136/72  12/12/21 120/78  10/27/21 (!) 147/86   Pulse Readings from Last 3 Encounters:  12/19/21 69  12/12/21 (!) 59  10/27/21 83    Wt Readings from Last 3 Encounters:  12/19/21 222 lb 4 oz (100.8 kg)  12/12/21 225 lb (102.1 kg)  10/05/21 225 lb 6 oz (102.2 kg)    Kidney Function Lab Results  Component Value Date/Time   CREATININE 0.90 12/12/2021 09:07 AM   CREATININE 1.06 10/05/2021 02:45 PM   CREATININE 1.01 12/09/2013 04:01 PM   GFR 89.99 12/12/2021 09:07 AM   GFRNONAA >60 10/29/2020 01:42 AM   GFRNONAA >60 12/09/2013 04:01 PM   GFRAA >60 12/06/2017 10:40 AM   GFRAA >60 12/09/2013 04:01 PM      Latest Ref Rng & Units 12/12/2021    9:07 AM 10/05/2021    2:45 PM 10/29/2020    1:42 AM  BMP  Glucose 70 - 99  mg/dL 751   025   852    BUN 6 - 23 mg/dL 20   27   13     Creatinine 0.40 - 1.50 mg/dL   7.78   2.42    Sodium 135 - 145 mEq/L 138   138   137    Potassium 3.5 - 5.1 mEq/L 3.8   4.3   3.7    Chloride 96 - 112 mEq/L 100   101   105    CO2 19 - 32 mEq/L 25   30   22     Calcium 8.4 - 10.5 mg/dL 9.1   9.3   8.6     Contacted patient on 01/06/2022 to discuss hypertension disease state  Current antihypertensive regimen:  Furosemide 20 mg daily  Amlodipine 2.5 mg daily  Patient verbally confirms he is taking the above medications as directed. Yes  How often are you checking your Blood Pressure? daily - first reading is after medication  Date  AM  PM 06/01  136/65  128/67 05/31  121/62  127/70  05/30  143/74  135/65 05/29  127/68  137/76 05/28  147/71  114/61 05/27  132/72  102/66 05/26  141/77  115/66  Wrist or arm cuff: Wrist Cuff Caffeine intake: Diet soda Salt intake: Does not add salt Over the counter medications including pseudoephedrine or NSAIDs? Zyrtec and flonase every day for seasonal allergies  Any readings above 180/120? No  What recent interventions/DTPs have been made by any provider to improve Blood Pressure control since last CPP Visit:  Continue current medication  Any recent hospitalizations or ED visits since last visit with CPP? No  What diet changes have been made to improve Blood Pressure Control?  Patient watches his salt intake and is mindful of what he eats.  What exercise is being done to improve your Blood Pressure Control?  Walks his dog several times a day  Adherence Review: Is the patient currently on ACE/ARB medication? No Does the patient have >5 day gap between last estimated fill dates? No  Star Rating Drugs:  Medication:  Last Fill: Day Supply Atorvastatin 20 mg 10/27/2021 90 Metformin 500 mg 12/19/2021 30  Care Gaps: Annual wellness visit in last year? Yes 10/31/2021 Most Recent BP reading: 136/72 on 12/19/2021  If  Diabetic: Most recent A1C reading: 7.8 on 12/12/2021 Last eye exam / retinopathy screening: Never done Last diabetic foot exam: Never done  Upcoming appointments: CCM appointment on 02/28/2022  02/11/2022, CPP notified  03/02/2022, [name] Clinical Pharmacy Assistant [phone]

## 2022-01-12 ENCOUNTER — Other Ambulatory Visit: Payer: Self-pay

## 2022-01-12 MED ORDER — ATORVASTATIN CALCIUM 20 MG PO TABS: 20.0000 mg | ORAL_TABLET | Freq: Every day | ORAL | 1 refills | Status: DC

## 2022-01-31 ENCOUNTER — Telehealth: Payer: Self-pay

## 2022-01-31 DIAGNOSIS — F331 Major depressive disorder, recurrent, moderate: Secondary | ICD-10-CM

## 2022-01-31 DIAGNOSIS — K219 Gastro-esophageal reflux disease without esophagitis: Secondary | ICD-10-CM

## 2022-01-31 MED ORDER — OMEPRAZOLE 20 MG PO CPDR: 20.0000 mg | DELAYED_RELEASE_CAPSULE | Freq: Every day | ORAL | 3 refills | Status: DC

## 2022-01-31 MED ORDER — FLUOXETINE HCL 40 MG PO CAPS: 40.0000 mg | ORAL_CAPSULE | Freq: Every day | ORAL | 3 refills | Status: DC

## 2022-01-31 NOTE — Telephone Encounter (Signed)
E-scribed refills.  

## 2022-02-24 ENCOUNTER — Telehealth: Payer: Self-pay

## 2022-02-24 NOTE — Progress Notes (Signed)
Chronic Care Management Pharmacy Assistant   Name: Mark [name]  MRN: [id number] DOB: [birth year]  Reason for Encounter: CCM (Appointment Reminder)  Medications: Outpatient Encounter Medications as of 02/24/2022  Medication Sig   amLODipine (NORVASC) 2.5 MG tablet Take 1 tablet (2.5 mg total) by mouth daily.   Ascorbic Acid (VITAMIN C) 1000 MG tablet Take 1,000 mg by mouth daily.   aspirin 81 MG tablet Take 81 mg by mouth daily.   atorvastatin (LIPITOR) 20 MG tablet Take 1 tablet (20 mg total) by mouth at bedtime.   cetirizine (ZYRTEC) 10 MG tablet Take 10 mg by mouth at bedtime.    clindamycin (CLEOCIN) 150 MG capsule Take 150 mg by mouth as needed. Prior to dental appt   cyclobenzaprine (FLEXERIL) 10 MG tablet TAKE ONE-HALF TO ONE TABLET BY MOUTH 2 TIMES DAILY AS NEEDED FOR MUSCLE SPASMS   ferrous sulfate 325 (65 FE) MG EC tablet Take 325 mg by mouth daily with breakfast.   FLUoxetine (PROZAC) 20 MG capsule Take 1 capsule (20 mg total) by mouth daily.   FLUoxetine (PROZAC) 40 MG capsule Take 1 capsule (40 mg total) by mouth daily.   fluticasone (FLONASE) 50 MCG/ACT nasal spray Place 2 sprays into both nostrils daily.   furosemide (LASIX) 20 MG tablet Take 1 tablet (20 mg total) by mouth daily.   metFORMIN (GLUCOPHAGE-XR) 500 MG 24 hr tablet Take 1 tablet (500 mg total) by mouth daily with breakfast.   Multiple Vitamins-Minerals (CERTAVITE SENIOR/ANTIOXIDANT) TABS Take 1 tablet by mouth daily.   naloxone (NARCAN) nasal spray 4 mg/0.1 mL Place 1 spray into the nose as needed for up to 365 doses (for opioid-induced respiratory depresssion). In case of emergency (overdose), spray once into each nostril. If no response within 3 minutes, repeat application and call 911.   naproxen (NAPROSYN) 500 MG tablet Take 1 tablet (500 mg total) by mouth daily.   omeprazole (PRILOSEC) 20 MG capsule Take 1 capsule (20 mg total) by mouth daily.   Oxycodone HCl 10 MG TABS Take 1 tablet (10 mg total) by  mouth every 8 (eight) hours as needed. Each refill must last 30 days.   [START ON 03/09/2022] Oxycodone HCl 10 MG TABS Take 1 tablet (10 mg total) by mouth every 8 (eight) hours as needed. Each refill must last 30 days.   [START ON 04/08/2022] Oxycodone HCl 10 MG TABS Take 1 tablet (10 mg total) by mouth every 8 (eight) hours as needed. Each refill must last 30 days.   potassium citrate (UROCIT-K) 10 MEQ (1080 MG) SR tablet Take 1 tablet (10 mEq total) by mouth 2 (two) times daily.   rOPINIRole (REQUIP) 2 MG tablet Take 1 tablet (2 mg total) by mouth at bedtime.   No facility-administered encounter medications on file as of 02/24/2022.   [name] was contacted to remind of upcoming telephone visit with [name]  on 02/28/2022 at 3:00. Patient was reminded to have any blood glucose and blood pressure readings available for review at appointment.   Patient confirmed appointment.   Are you having any problems with your medications? No   Do you have any concerns you like to discuss with the pharmacist? No   CCM referral has been placed prior to visit?  No    Star Rating Drugs: Medication:  Last Fill: Day Supply Atorvastatin 20 mg 01/12/2022 90 Metformin 500 mg 02/09/2022 30  [name], CPP notified  Claudina Lick, [place] Clinical Pharmacy Assistant [phone]

## 2022-02-28 ENCOUNTER — Ambulatory Visit: Payer: Medicare Other | Admitting: Pharmacist

## 2022-02-28 DIAGNOSIS — I10 Essential (primary) hypertension: Secondary | ICD-10-CM

## 2022-02-28 DIAGNOSIS — E1169 Type 2 diabetes mellitus with other specified complication: Secondary | ICD-10-CM

## 2022-02-28 NOTE — Progress Notes (Signed)
Chronic Care Management Pharmacy Note  03/08/2022 Name:  Mark [name] MRN:  [id number] DOB:  1956-07-23  Summary: CCM F/U visit -Home BP average 123/70, much improved with amlodipine -Home BG range 102-190 (random glucose), suggest improvement with last A1c 7.8%  (12/2021)  Recommendations/Changes made from today's visit: -No med changes; can consider increasing metformin if A1c in August is still > 7%  Plan: -PCP F/U 03/27/22 -[facility] will call patient 2 months for DM update -Pharmacist follow up televisit scheduled for 3 months    Subjective: Mark [name] is a 66 y.o. year old male who is a primary patient of Ria Bush, MD.  The CCM team was consulted for assistance with disease management and care coordination needs.    Engaged with patient by telephone for follow up visit in response to provider referral for pharmacy case management and/or care coordination services.   Consent to Services:  The patient was given information about Chronic Care Management services, agreed to services, and gave verbal consent prior to initiation of services.  Please see initial visit note for detailed documentation.   Patient Care Team: Ria Bush, MD as PCP - General (Family Medicine) Hyman Hopes, [place], [place] as Referring Physician (Dentistry) Milinda Pointer, MD as Referring Physician (Pain Medicine) Teodoro Spray, MD as Consulting Physician (Cardiology) Laverle Hobby, MD as Consulting Physician (Pulmonary Disease) Abbie Sons, MD (Urology) Leonie Green, OD as Referring Physician (Optometry) Charlton Haws, [facility] as Pharmacist (Pharmacist)  Patient lives alone, his mom lives next door and cooks for him occasionally. She had a stroke last year and he helps with her medications as well.  Recent office visits: 12/19/21 Dr Danise Mina OV: annual visit - A1c 7.8%, new DM dx. Start metformin XR 500 mg AM. Would be willing to retry losartan. RTC 3  months.  10/26/21 phone call - c/o side effects with losartan (muscle pain, insomnia, nausea, anxiety); Stop losartan, start amlodipine 2.5 mg.  10/05/21 Dr Danise Mina OV: chronic f/u; BP 142/76. Start Losartan 25 mg daily (uricosuric effect). BMP 10 days.  09/14/20 Dr Danise Mina OV: annual exam; referred to GI for Cologard  Recent consult visits: 09/21/21 Dr Dossie Arbour (Pain mgmt): neck pain 09/12/21 Dr Bernardo Heater (Urology): bilateral nephrolithiasis, opted for surveillance. Continue K Citrate.   Hospital visits: None in previous 6 months   Objective:  Lab Results  Component Value Date   CREATININE 0.90 12/12/2021   BUN 20 12/12/2021   GFR 89.99 12/12/2021   GFRNONAA >60 10/29/2020   GFRAA >60 12/06/2017   NA 138 12/12/2021   K 3.8 12/12/2021   CALCIUM 9.1 12/12/2021   CO2 25 12/12/2021   GLUCOSE 243 (H) 12/12/2021    Lab Results  Component Value Date/Time   HGBA1C 7.8 (H) 12/12/2021 09:07 AM   HGBA1C 5.6 09/07/2020 01:16 PM   GFR 89.99 12/12/2021 09:07 AM   GFR 74.04 10/05/2021 02:45 PM   MICROALBUR 1.3 12/12/2021 09:07 AM   MICROALBUR 12.9 (H) 04/28/2014 10:35 AM    Last diabetic Eye exam: No results found for: "HMDIABEYEEXA"  Last diabetic Foot exam: No results found for: "HMDIABFOOTEX"   Lab Results  Component Value Date   CHOL 129 12/12/2021   HDL 41.80 12/12/2021   LDLCALC 46 09/07/2020   LDLDIRECT 64.0 12/12/2021   TRIG 222.0 (H) 12/12/2021   CHOLHDL 3 12/12/2021       Latest Ref Rng & Units 10/05/2021    2:45 PM 10/29/2020    1:42 AM 10/27/2020  1:26 AM  Hepatic Function  Total Protein 6.0 - 8.3 g/dL 6.5  5.8  5.4   Albumin 3.5 - 5.2 g/dL 4.1  2.6  2.6   AST 0 - 37 U/L 24  137  137   ALT 0 - 53 U/L 28  163  157   Alk Phosphatase 39 - 117 U/L 67  170  189   Total Bilirubin 0.2 - 1.2 mg/dL 0.7  2.6  5.3     Lab Results  Component Value Date/Time   TSH 4.26 12/12/2021 09:07 AM   TSH 2.05 08/09/2018 08:22 AM       Latest Ref Rng & Units 12/12/2021    9:07  AM 10/29/2020    1:42 AM 10/28/2020    9:45 AM  CBC  WBC 4.0 - 10.5 K/uL 6.3  14.1  15.7   Hemoglobin 13.0 - 17.0 g/dL 14.5  14.8  14.1   Hematocrit 39.0 - 52.0 % 41.4  43.7  40.4   Platelets 150.0 - 400.0 K/uL 177.0  151  163     No results found for: "VD25OH"  Clinical ASCVD: No  The ASCVD Risk score (Arnett DK, et al., 2019) failed to calculate for the following reasons:   The valid total cholesterol range is 130 to 320 mg/dL       10/31/2021   11:28 AM 09/08/2020    2:06 PM 08/27/2020    8:18 AM  Depression screen PHQ 2/9  Decreased Interest 1 0 0  Down, Depressed, Hopeless 1 0 0  PHQ - 2 Score 2 0 0  Altered sleeping 3  0  Tired, decreased energy 3  0  Change in appetite 0  0  Feeling bad or failure about yourself  0  0  Trouble concentrating 0  0  Moving slowly or fidgety/restless 0  0  Suicidal thoughts 0  0  PHQ-9 Score 8  0  Difficult doing work/chores Somewhat difficult  Not difficult at all       02/11/2018    3:40 PM  GAD 7 : Generalized Anxiety Score  Nervous, Anxious, on Edge 1  Control/stop worrying 0  Worry too much - different things 1  Trouble relaxing 1  Restless 0  Easily annoyed or irritable 0  Afraid - awful might happen 0  Total GAD 7 Score 3    Social History   Tobacco Use  Smoking Status Never  Smokeless Tobacco Never   BP Readings from Last 3 Encounters:  12/19/21 136/72  12/12/21 120/78  10/27/21 (!) 147/86   Pulse Readings from Last 3 Encounters:  12/19/21 69  12/12/21 (!) 59  10/27/21 83   Wt Readings from Last 3 Encounters:  12/19/21 222 lb 4 oz (100.8 kg)  12/12/21 225 lb (102.1 kg)  10/05/21 225 lb 6 oz (102.2 kg)   BMI Readings from Last 3 Encounters:  12/19/21 35.07 kg/m  12/12/21 35.24 kg/m  10/27/21 35.30 kg/m    Assessment/Interventions: Review of patient past medical history, allergies, medications, health status, including review of consultants reports, laboratory and other test data, was performed as part  of comprehensive evaluation and provision of chronic care management services.   SDOH:  (Social Determinants of Health) assessments and interventions performed: No - done 10/2021  SDOH Screenings   Alcohol Screen: Low Risk  (10/31/2021)   Alcohol Screen    Last Alcohol Screening Score (AUDIT): 0  Depression (PHQ2-9): Medium Risk (10/31/2021)   Depression (PHQ2-9)    PHQ-2  Score: 8  Financial Resource Strain: Low Risk  (10/31/2021)   Overall Financial Resource Strain (CARDIA)    Difficulty of Paying Living Expenses: Not hard at all  Food Insecurity: No Food Insecurity (10/31/2021)   Hunger Vital Sign    Worried About Running Out of Food in the Last Year: Never true    [place] in the Last Year: Never true  Housing: Low Risk  (10/31/2021)   Housing    Last Housing Risk Score: 0  Physical Activity: Insufficiently Active (10/31/2021)   Exercise Vital Sign    Days of Exercise per Week: 3 days    Minutes of Exercise per Session: 40 min  Social Connections: Socially Isolated (10/31/2021)   Social Connection and Isolation Panel [NHANES]    Frequency of Communication with Friends and Family: Twice a week    Frequency of Social Gatherings with Friends and Family: Never    Attends Religious Services: Never    Marine scientist or Organizations: No    Attends Archivist Meetings: Never    Marital Status: Divorced  Stress: No Stress Concern Present (10/31/2021)   [employer] [place]    Feeling of Stress : Not at all  Tobacco Use: Low Risk  (12/19/2021)   Patient History    Smoking Tobacco Use: Never    Smokeless Tobacco Use: Never    Passive Exposure: Not on file  Transportation Needs: Unknown (10/31/2021)   PRAPARE - Hydrologist (Medical): Not on file    Lack of Transportation (Non-Medical): No    CCM Care Plan  Allergies  Allergen Reactions   Ciprofloxacin Other (See Comments)     PANIC ATTACKS (Fluoroquinolones)    Losartan Other (See Comments)    Muscle pain, difficulty sleeping, anxiety, nausea   Metformin Other (See Comments)    Bad acid reflux   Buprenorphine Hcl Nausea Only   Morphine And Related Nausea Only   Penicillins Rash    Rash Has patient had a PCN reaction causing immediate rash, facial/tongue/throat swelling, SOB or lightheadedness with hypotension: Unknown Has patient had a PCN reaction causing severe rash involving mucus membranes or skin necrosis: Unknown Has patient had a PCN reaction that required hospitalization: No Has patient had a PCN reaction occurring within the last 10 years: No CHILDHOOD REACTION. If all of the above answers are "NO", then may proceed with Cephalosporin use.     Medications Reviewed Today     Reviewed by Ria Bush, MD (Physician) on 12/19/21 at 1112  Med List Status: <None>   Medication Order Taking? Sig Documenting Provider Last Dose Status Informant  amLODipine (NORVASC) 2.5 MG tablet [id number] Yes Take 1 tablet (2.5 mg total) by mouth daily. Ria Bush, MD Taking Active   Ascorbic Acid (VITAMIN C) 1000 MG tablet [id number] Yes Take 1,000 mg by mouth daily. [provider] Taking Active Self  aspirin 81 MG tablet [id number] Yes Take 81 mg by mouth daily. [provider] Taking Active Self  atorvastatin (LIPITOR) 20 MG tablet [id number] Yes Take 1 tablet (20 mg total) by mouth at bedtime. Ria Bush, MD Taking Active   cetirizine (ZYRTEC) 10 MG tablet [id number] Yes Take 10 mg by mouth at bedtime.  [provider] Taking Active Self  clindamycin (CLEOCIN) 150 MG capsule [id number] Yes Take 150 mg by mouth as needed. Prior to dental appt [provider] Taking Active   cyclobenzaprine (FLEXERIL) 10 MG  tablet [id number] Yes TAKE ONE-HALF TO ONE TABLET BY MOUTH 2 TIMES DAILY AS NEEDED FOR MUSCLE SPASMS Ria Bush, MD Taking Active   ferrous sulfate 325 (65 FE) MG  EC tablet [id number] Yes Take 325 mg by mouth daily with breakfast. [provider] Taking Active Self  FLUoxetine (PROZAC) 20 MG capsule [id number] Yes Take 1 capsule (20 mg total) by mouth daily. Ria Bush, MD Taking Active   FLUoxetine [facility]) 40 MG capsule [id number] Yes Take 1 capsule (40 mg total) by mouth daily. Ria Bush, MD Taking Active   fluticasone [facility]) 50 MCG/ACT nasal spray [id number] Yes Place 2 sprays into both nostrils daily. Ria Bush, MD Taking Active   furosemide (LASIX) 20 MG tablet [id number] Yes Take 1 tablet (20 mg total) by mouth daily. Ria Bush, MD Taking Active   Multiple Vitamins-Minerals (CERTAVITE SENIOR/ANTIOXIDANT) TABS [id number] Yes Take 1 tablet by mouth daily. [provider] Taking Active Self  naloxone [facility]) nasal spray 4 mg/0.1 mL [id number] Yes Place 1 spray into the nose as needed for up to 365 doses (for opioid-induced respiratory depresssion). In case of emergency (overdose), spray once into each nostril. If no response within 3 minutes, repeat application and call [phone]. Milinda Pointer, MD Taking Active   naproxen (NAPROSYN) 500 MG tablet [id number] Yes Take 1 tablet (500 mg total) by mouth daily. Ria Bush, MD Taking Active   omeprazole (PRILOSEC) 20 MG capsule [id number] Yes Take 1 capsule (20 mg total) by mouth daily as needed (GERD). Ria Bush, MD Taking Active   Oxycodone HCl 10 MG TABS [id number] Yes Take 1 tablet (10 mg total) by mouth every 8 (eight) hours as needed. Each refill must last 30 days. Milinda Pointer, MD Taking Active   Oxycodone HCl 10 MG TABS [id number] Yes Take 1 tablet (10 mg total) by mouth every 8 (eight) hours as needed. Each refill must last 30 days. Milinda Pointer, MD Taking Active   Oxycodone HCl 10 MG TABS [id number] Yes Take 1 tablet (10 mg total) by mouth every 8 (eight) hours as needed. Each refill must last 30 days. Milinda Pointer, MD Taking Active    potassium citrate (UROCIT-K) 10 MEQ (1080 MG) SR tablet [id number] Yes Take 1 tablet (10 mEq total) by mouth 2 (two) times daily. Abbie Sons, MD Taking Active   rOPINIRole (REQUIP) 2 MG tablet [id number] Yes Take 1 tablet (2 mg total) by mouth at bedtime. Ria Bush, MD Taking Active   Med List Note Janett Billow, RN 12/12/21 1326): MR 05/08/22 UDS 12/12/21             Patient Active Problem List   Diagnosis Date Noted   Cardiac pacemaker in situ 12/12/2021   DDD (degenerative disc disease), cervical 03/16/2021   Chronic low back pain (Bilateral) w/o sciatica 03/16/2021   Chronic shoulder pain (Bilateral) (L>R) 03/16/2021   History of laparoscopic cholecystectomy (10/27/2020) 12/08/2020   Chronic diastolic heart failure ([place]) 10/22/2020   Type 2 diabetes mellitus with other specified complication ([place]) 19/75/8832   Serum lipase elevation 10/22/2020   Transaminasemia 54/98/2641   Uncomplicated opioid dependence ([place]) 09/08/2020   Trigger point with back pain (Left) 03/30/2020   Trigger point of shoulder region (scapula) (Left) 03/30/2020   Osteoarthritis of first carpometacarpal joint (thumb) of hand (Left) 03/11/2020   Chronic thumb pain (Left) 03/03/2020   Trigger middle finger of right hand 03/03/2020   Chronic hand pain (Left) 03/03/2020   Pharmacologic therapy 12/02/2019   Disorder of skeletal system  12/02/2019   Problems influencing health status 12/02/2019   Cervicalgia 02/12/2019   Trigger finger of thumb (Right) 02/12/2019   GERD (gastroesophageal reflux disease) 09/24/2018   IDA (iron deficiency anemia) 09/24/2018   Erectile dysfunction due to arterial insufficiency 09/12/2018   Trigger finger of index finger (Right) 08/16/2018   RLS (restless legs syndrome) 02/11/2018   Immunization deficiency 02/11/2018   Positive colorectal cancer screening using Cologuard test 09/21/2017   Low testosterone in male 02/18/2017   Chronic fatigue 02/08/2017    Advanced care planning/counseling discussion 08/11/2016   Chronic cervical radicular pain 06/21/2016   Long term current use of opiate analgesic 05/15/2016   OSA (obstructive sleep apnea) 04/11/2016   Hypocitraturia 03/16/2016   Cervical foraminal stenosis (Severe C6-7) (Left) 02/02/2016   Chronic sacroiliac joint pain (Bilateral) 02/02/2016   Osteoarthritis of sacroiliac joint (Bilateral) 02/02/2016   Lumbar facet hypertrophy (L1-2, L2-3, and L4-5) (Bilateral) 02/02/2016   Lumbar IVDD (intervertebral disc displacement) 02/02/2016   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) 02/02/2016   Lumbar facet syndrome (Bilateral) 02/02/2016   Osteoarthritis of shoulder (Left) 02/02/2016   Osteoarthritis of knee (Bilateral) (R>L) 02/02/2016   Radicular pain of shoulder (Bilateral) (L>R) 09/01/2015   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) 09/01/2015    Class: History of   Cervicogenic headache 09/01/2015   Cervical spondylosis with radiculopathy (Bilateral) (L>R) 09/01/2015   Cervical facet syndrome (Bilateral) (L>R) 09/01/2015   Opiate use (60 MME/Day) 05/31/2015   Encounter for therapeutic drug level monitoring 05/31/2015   Chronic neck pain (1ry area of Pain) (Bilateral) (L>R) 05/31/2015   Chronic knee pain (Bilateral) (R>L) 05/31/2015   History of permanent cardiac pacemaker placement 05/31/2015   Opioid-induced constipation (OIC) 05/31/2015   Encounter for general adult medical examination with abnormal findings 05/07/2015   Candidal dermatitis 05/07/2015   Hypertension 02/16/2014   Bradycardia 02/16/2014   Cardiac murmur 02/16/2014   Severe obesity (BMI 35.0-39.9) with comorbidity ([place]) 04/22/2013   Benign prostatic hyperplasia with urinary obstruction 05/28/2012   Nephrolithiasis 05/28/2012   Medicare annual wellness visit, subsequent 03/27/2012   Hyperlipidemia associated with type 2 diabetes mellitus ([place]) 03/27/2012   GAD (generalized anxiety disorder)  12/25/2011   Insomnia 09/27/2011   Chronic pain syndrome 03/28/2011   MDD (major depressive disorder), recurrent episode, moderate ([place]) 03/28/2011    Immunization History  Administered Date(s) Administered   Influenza, High Dose Seasonal PF 05/04/2021   Influenza,inj,Quad PF,6+ Mos 04/22/2013, 04/28/2014, 05/07/2015, 04/11/2016, 05/22/2017, 05/16/2018   Influenza-Unspecified 06/16/2019, 04/15/2020   Moderna Covid-19 Vaccine Bivalent Booster 102yr & up 05/05/2021   Moderna Sars-Covid-2 Vaccination 11/06/2019, 12/04/2019, 07/07/2020   Pneumococcal Polysaccharide-23 05/30/2006   Tdap 11/19/2009, 04/15/2020    Conditions to be addressed/monitored:  Hypertension, Hyperlipidemia, Heart Failure, Depression, and Allergic Rhinitis, RLS  Care Plan : [place] Updates made by FCharlton Haws RR[place]since 03/08/2022 12:00 AM     Problem: Hypertension, Hyperlipidemia, Heart Failure, Depression, and Allergic Rhinitis, RLS   Priority: High     Long-Range Goal: Disease mgmt   Start Date: 07/15/2021  Expected End Date: 07/15/2022  This Visit's Progress: On track  Recent Progress: On track  Priority: High  Note:   Current Barriers:  Suboptimal therapeutic regimen for DM  Pharmacist Clinical Goal(s):  Patient will adhere to plan to optimize therapeutic regimen for DM as evidenced by report of adherence to recommended medication management changes through collaboration with PharmD and provider.   Interventions: 1:1 collaboration with GRia Bush MD  regarding development and update of comprehensive plan of care as evidenced by provider attestation and co-signature Inter-disciplinary care team collaboration (see longitudinal plan of care) Comprehensive medication review performed; medication list updated in electronic medical record  Hyperlipidemia: (LDL goal < 100) -Controlled - baseline LDL 130, currently 64 (12/2021); pt endorses compliance with statin -Current  treatment: Atorvastatin 20 mg daily HS - Appropriate, Effective, Safe, Accessible Omega-3 1000 mg - Appropriate, Effective, Safe, Accessible Aspirin 81 mg daily - Appropriate, Effective, Safe, Accessible -Educated on Cholesterol goals; Benefits of statin for ASCVD risk reduction; -Recommended to continue current medication  Heart Failure (BP Goal < 130/80, manage symptoms and prevent exacerbations) -Controlled - BP at goal per home readings -Hx heart block s/p dual chamber pacemaker -Last ejection fraction: 55-65% (Date: 10/2017) -HF type: Diastolic -Home BP readings:  7/21 142/70  123/68 7/22 108/70  123/64 7/23 124/85  130/72 7/24 118/66  118/70 7/25 119/68  133/71 -Current treatment: Furosemide 20 mg daily - Appropriate, Effective, Safe, Accessible Amlodipine 2.5 mg daily - Appropriate, Effective, Safe, Accessible -Previously tried/failed meds: losartan (muscle pain, insomnia, nausea, anxiety) -Educated on BP goals and benefits of medications for prevention of heart attack, stroke and kidney damage; Daily salt intake goal < 2300 mg; Importance of home blood pressure monitoring; Proper BP monitoring technique -Counseled to monitor BP at home daily -Recommend to continue current medication  Diabetes (A1c goal <7%) -Query controlled - A1c 7.8% (12/2021) above goal; home readings suggest improved control -Home BG readings:  7/21 190 (3-hr PP) 7/22 169 (3-hr PP) 7/23 134 (FBG) 151 (PP) 7/24 148 (FBG) 102 (PP) 7/25 111 (FBG) -Current medications: Metformin XR 500 mg daily - Appropriate, Query Effective -Medications previously tried: metformin IR  -Educated on A1c and blood sugar goals;Complications of diabetes including kidney damage, retinal damage, and cardiovascular disease; Carbohydrate counting and/or plate method -Recommended to continue current medication for now; f/u PCP 8/21 for recheck A1c  Depression/Anxiety (Goal: manage symptoms) -Controlled - pt has been on  fluoxetine since at least 2012; he reports is is working "well enough" currently -PHQ9: 0 (09/2020) - no/minimal depression -GAD7: 3 (02/2018) - minimal anxiety -Connected with PCP for mental health support -Current treatment: Fluoxetine 20 mg daily HS -Appropriate, Query Effective Fluoxetine 40 mg daily AM - Appropriate, Query Effective -Educated on Benefits of medication for symptom control -Concern for tachyphylaxis with fluoxetine given > 10 years on it, most SSRI efficacy wanes over time; he would be a good candidate for SNRI (duloxetine) w/ additional pain benefit if BP can be controlled -Recommended to continue current medication; consider duloxetine in future  Chronic pain (Goal: manage pain) -Not ideally controlled - per pt report neck pain is worsening, he reports it is starting to feel like it did before his neck surgery;  -cervical radiculopathy; lumbar disc displacement, OA of knee, shoulder -Follows with Dr Dossie Arbour (pain mgmt) -Current treatment  Oxycodone 10 mg q6h (#120/month): 2-3 times per day - Appropriate, Effective, Safe, Accessible Naproxen 500 mg BID prn - takes once a day - Appropriate, Effective, Query Safe (BP effects) Cyclobenzaprine 10 mg BID prn - Appropriate, Effective, Safe, Accessible Narcan PRN - Appropriate -Educated on risks of chronic NSAID use including elevated BP and kidney damage; discussed naproxen could be contributing to high BP at home -Of note LFTs have normalized so it should be safe to use Tylenol PRN -Recommend to continue current medication  Renal stones (Goal: prevent recurrence) -Controlled  - pt follows with urology; he endorses compliance with  potassium citrate -Current treatment  Potassium citrate 10 mEq BID - Appropriate, Effective, Safe, Accessible -Discussed role of potassium citrate in controlling/preventing renal stones -Recommended to continue current medication  Health Maintenance -Vaccine gaps: Shingrix -Updated flu and  covid vaccines in record; advised pt get Prevnar vaccine at next office visit  Patient Goals/Self-Care Activities Patient will:  - take medications as prescribed as evidenced by patient report and record review focus on medication adherence by pill box check blood pressure daily, document, and provide at future appointments        Medication Assistance: None required.  Patient affirms current coverage meets needs.  Compliance/Adherence/Medication fill history: Care Gaps: Cologuard (due 09/21/20) - planning to set up colonoscopy in 2023 AWV due  Star-Rating Drugs: Atorvastatin 20 mg - PDC 100% Metformin - PDC 100%  Medication Access: Within the past 30 days, how often has patient missed a dose of medication? 0 Is a pillbox or other method used to improve adherence? Yes  Factors that may affect medication adherence? no barriers identified Are meds synced by current pharmacy? No  Are meds delivered by current pharmacy? Yes  Does patient experience delays in picking up medications due to transportation concerns? No   [facility] Reviewed: Is patient disadvantaged to use [facility]?: Yes  Current Rx insurance plan: [place] Name and location of Current pharmacy:  [place], [place] [address] [place] [address] Phone: [phone] Fax: [phone]  [facility] ([place]) [place], [place] [address] Phone: [phone] Fax: [phone]  [facility] services reviewed with patient today?: No  Patient requests to transfer care to [facility]?: No  Reason patient declined to change pharmacies: Disadvantaged due to insurance/mail order   Care Plan and Follow Up Patient Decision:  Patient agrees to Care Plan and Follow-up.  Plan: Telephone follow up appointment with care management team member scheduled for:  3 months  Charlene Brooke, PharmD, BCACP Clinical Pharmacist [facility] at [facility] [phone]

## 2022-03-01 ENCOUNTER — Encounter: Payer: Medicare Other | Admitting: Pain Medicine

## 2022-03-08 NOTE — Patient Instructions (Signed)
Visit Information  Phone number for Pharmacist: [phone]   Goals Addressed   None     Care Plan : CCM Pharmacy Care Plan  Updates made by Kathyrn Sheriff, RPH since 03/08/2022 12:00 AM     Problem: Hypertension, Hyperlipidemia, Heart Failure, Depression, and Allergic Rhinitis, RLS   Priority: High     Long-Range Goal: Disease mgmt   Start Date: 07/15/2021  Expected End Date: 07/15/2022  This Visit's Progress: On track  Recent Progress: On track  Priority: High  Note:   Current Barriers:  Suboptimal therapeutic regimen for DM  Pharmacist Clinical Goal(s):  Patient will adhere to plan to optimize therapeutic regimen for DM as evidenced by report of adherence to recommended medication management changes through collaboration with PharmD and provider.   Interventions: 1:1 collaboration with [name], MD regarding development and update of comprehensive plan of care as evidenced by provider attestation and co-signature Inter-disciplinary care team collaboration (see longitudinal plan of care) Comprehensive medication review performed; medication list updated in electronic medical record  Hyperlipidemia: (LDL goal < 100) -Controlled - baseline LDL 130, currently 64 (12/2021); pt endorses compliance with statin -Current treatment: Atorvastatin 20 mg daily HS - Appropriate, Effective, Safe, Accessible Omega-3 1000 mg - Appropriate, Effective, Safe, Accessible Aspirin 81 mg daily - Appropriate, Effective, Safe, Accessible -Educated on Cholesterol goals; Benefits of statin for ASCVD risk reduction; -Recommended to continue current medication  Heart Failure (BP Goal < 130/80, manage symptoms and prevent exacerbations) -Controlled - BP at goal per home readings -Hx heart block s/p dual chamber pacemaker -Last ejection fraction: 55-65% (Date: 10/2017) -HF type: Diastolic -Home BP readings:   4/97 142/70  123/68 7/22 108/70  123/64 7/23 124/85  130/72 7/24 118/66  118/70 7/25 119/68  133/71 -Current treatment: Furosemide 20 mg daily - Appropriate, Effective, Safe, Accessible Amlodipine 2.5 mg daily - Appropriate, Effective, Safe, Accessible -Previously tried/failed meds: losartan (muscle pain, insomnia, nausea, anxiety) -Educated on BP goals and benefits of medications for prevention of heart attack, stroke and kidney damage; Daily salt intake goal < 2300 mg; Importance of home blood pressure monitoring; Proper BP monitoring technique -Counseled to monitor BP at home daily -Recommend to continue current medication  Diabetes (A1c goal <7%) -Query controlled - A1c 7.8% (12/2021) above goal; home readings suggest improved control -Home BG readings:  7/21 190 (3-hr PP) 7/22 169 (3-hr PP) 7/23 134 (FBG) 151 (PP) 7/24 148 (FBG) 102 (PP) 7/25 111 (FBG) -Current medications: Metformin XR 500 mg daily - Appropriate, Query Effective -Medications previously tried: metformin IR  -Educated on A1c and blood sugar goals;Complications of diabetes including kidney damage, retinal damage, and cardiovascular disease; Carbohydrate counting and/or plate method -Recommended to continue current medication for now; f/u PCP 8/21 for recheck A1c  Depression/Anxiety (Goal: manage symptoms) -Controlled - pt has been on fluoxetine since at least 2012; he reports is is working "well enough" currently -PHQ9: 0 (09/2020) - no/minimal depression -GAD7: 3 (02/2018) - minimal anxiety -Connected with PCP for mental health support -Current treatment: Fluoxetine 20 mg daily HS -Appropriate, Query Effective Fluoxetine 40 mg daily AM - Appropriate, Query Effective -Educated on Benefits of medication for symptom control -Concern for tachyphylaxis with fluoxetine given > 10 years on it, most SSRI efficacy wanes over time; he would be a good candidate for SNRI (duloxetine) w/ additional pain benefit if BP can be  controlled -Recommended to continue current medication; consider duloxetine in future  Chronic pain (Goal: manage pain) -Not ideally controlled - per pt  report neck pain is worsening, he reports it is starting to feel like it did before his neck surgery;  -cervical radiculopathy; lumbar disc displacement, OA of knee, shoulder -Follows with Dr Laban Emperor (pain mgmt) -Current treatment  Oxycodone 10 mg q6h (#120/month): 2-3 times per day - Appropriate, Effective, Safe, Accessible Naproxen 500 mg BID prn - takes once a day - Appropriate, Effective, Query Safe (BP effects) Cyclobenzaprine 10 mg BID prn - Appropriate, Effective, Safe, Accessible Narcan PRN - Appropriate -Educated on risks of chronic NSAID use including elevated BP and kidney damage; discussed naproxen could be contributing to high BP at home -Of note LFTs have normalized so it should be safe to use Tylenol PRN -Recommend to continue current medication  Renal stones (Goal: prevent recurrence) -Controlled  - pt follows with urology; he endorses compliance with potassium citrate -Current treatment  Potassium citrate 10 mEq BID - Appropriate, Effective, Safe, Accessible -Discussed role of potassium citrate in controlling/preventing renal stones -Recommended to continue current medication  Health Maintenance -Vaccine gaps: Shingrix -Updated flu and covid vaccines in record; advised pt get Prevnar vaccine at next office visit  Patient Goals/Self-Care Activities Patient will:  - take medications as prescribed as evidenced by patient report and record review focus on medication adherence by pill box check blood pressure daily, document, and provide at future appointments      Patient verbalizes understanding of instructions and care plan provided today and agrees to view in MyChart. Active MyChart status and patient understanding of how to access instructions and care plan via MyChart confirmed with patient.    Telephone follow  up appointment with pharmacy team member scheduled for: 3 months  [name], PharmD, [facility] Clinical Pharmacist [facility] [phone]

## 2022-03-22 NOTE — Telephone Encounter (Signed)
ERROR

## 2022-03-27 ENCOUNTER — Ambulatory Visit (INDEPENDENT_AMBULATORY_CARE_PROVIDER_SITE_OTHER): Payer: Medicare Other | Admitting: Family Medicine

## 2022-03-27 ENCOUNTER — Encounter: Payer: Self-pay | Admitting: Family Medicine

## 2022-03-27 VITALS — BP 122/78 | HR 86 | Temp 97.5°F | Ht 66.75 in | Wt 219.5 lb

## 2022-03-27 DIAGNOSIS — E1169 Type 2 diabetes mellitus with other specified complication: Secondary | ICD-10-CM

## 2022-03-27 DIAGNOSIS — I10 Essential (primary) hypertension: Secondary | ICD-10-CM

## 2022-03-27 DIAGNOSIS — E66811 Obesity, class 1: Secondary | ICD-10-CM

## 2022-03-27 DIAGNOSIS — E669 Obesity, unspecified: Secondary | ICD-10-CM

## 2022-03-27 DIAGNOSIS — Z23 Encounter for immunization: Secondary | ICD-10-CM

## 2022-03-27 DIAGNOSIS — R195 Other fecal abnormalities: Secondary | ICD-10-CM | POA: Diagnosis not present

## 2022-03-27 DIAGNOSIS — G4733 Obstructive sleep apnea (adult) (pediatric): Secondary | ICD-10-CM

## 2022-03-27 LAB — POCT GLYCOSYLATED HEMOGLOBIN (HGB A1C): Hemoglobin A1C: 6.1 % — AB (ref 4.0–5.6)

## 2022-03-27 NOTE — Patient Instructions (Addendum)
Prevnar-20 today.  Good to see you today. Congratulations on sugar control!  Continue following low sugar low carb diet.  We will call to schedule a diabetic retinopathy.   We will refer you to ENT to discuss eligibility for inspire device.  Good to see you today. Return as needed or in 5 months for diabetes follow up visit

## 2022-03-27 NOTE — Progress Notes (Signed)
Patient ID: Mark [name], male    DOB: 04-Jan-1956, 66 y.o.   MRN: [id number]  This visit was conducted in person.  BP 122/78   Pulse 86   Temp (!) 97.5 F (36.4 C) (Temporal)   Ht 5' 6.75" (1.695 m)   Wt 219 lb 8 oz (99.6 kg)   SpO2 94%   BMI 34.64 kg/m    CC: DM f/u visit  Subjective:   HPI: Mark [name] is a 66 y.o. male presenting on 03/27/2022 for Diabetes (Here for 3 mo f/u.)   Has had 2 spells of nausea that could last several days, wonders if sinus drainage contributing. Predominantly managed with zyrtec, flonase and benadryl PRN.   OSA - not using CPAP, interested in evaluation for inspire device.   HTN - brings BP log showing 109-140s/60-70s. Continues lasix 20mg  daily, with recent addition of amlodipine 2.5mg  daily. No HA, vision changes, CP/tightness, SOB, leg swelling.   Newly diagnosed diabetic 12/2021 with A1c up to 7.8% - does regularly check sugars: 102-190 (random glucose). Compliant with antihyperglycemic regimen which includes: metformin XR 500mg  daily - tolerating this well. Denies low sugars or hypoglycemic symptoms. Denies paresthesias, blurry vision. Last diabetic eye exam DUE. Glucometer brand: using mother's old glucometer, declines new meter. Last foot exam: DUE. DSME: discussed, declines as he's been through this with wife's diabetes diagnosis.  Lab Results  Component Value Date   HGBA1C 6.1 (A) 03/27/2022   Diabetic Foot Exam - Simple   Simple Foot Form Diabetic Foot exam was performed with the following findings: Yes 03/27/2022  2:44 PM  Visual Inspection No deformities, no ulcerations, no other skin breakdown bilaterally: Yes Sensation Testing Intact to touch and monofilament testing bilaterally: Yes Pulse Check Posterior Tibialis and Dorsalis pulse intact bilaterally: Yes Comments 1+ DP bilaterally Mild swelling to ankles bilaterally    Lab Results  Component Value Date   MICROALBUR 1.3 12/12/2021         Relevant past medical,  surgical, family and social history reviewed and updated as indicated. Interim medical history since our last visit reviewed. Allergies and medications reviewed and updated. Outpatient Medications Prior to Visit  Medication Sig Dispense Refill   amLODipine (NORVASC) 2.5 MG tablet Take 1 tablet (2.5 mg total) by mouth daily. 90 tablet 3   Ascorbic Acid (VITAMIN C) 1000 MG tablet Take 1,000 mg by mouth daily.     aspirin 81 MG tablet Take 81 mg by mouth daily.     atorvastatin (LIPITOR) 20 MG tablet Take 1 tablet (20 mg total) by mouth at bedtime. 90 tablet 1   cetirizine (ZYRTEC) 10 MG tablet Take 10 mg by mouth at bedtime.      clindamycin (CLEOCIN) 150 MG capsule Take 150 mg by mouth as needed. Prior to dental appt     cyclobenzaprine (FLEXERIL) 10 MG tablet TAKE ONE-HALF TO ONE TABLET BY MOUTH 2 TIMES DAILY AS NEEDED FOR MUSCLE SPASMS 40 tablet 0   ferrous sulfate 325 (65 FE) MG EC tablet Take 325 mg by mouth daily with breakfast.     FLUoxetine (PROZAC) 20 MG capsule Take 1 capsule (20 mg total) by mouth daily. 90 capsule 4   FLUoxetine (PROZAC) 40 MG capsule Take 1 capsule (40 mg total) by mouth daily. 90 capsule 3   fluticasone (FLONASE) 50 MCG/ACT nasal spray Place 2 sprays into both nostrils daily. 48 g 4   furosemide (LASIX) 20 MG tablet Take 1 tablet (20 mg total) by  mouth daily. 90 tablet 4   metFORMIN (GLUCOPHAGE-XR) 500 MG 24 hr tablet Take 1 tablet (500 mg total) by mouth daily with breakfast. 30 tablet 6   Multiple Vitamins-Minerals (CERTAVITE SENIOR/ANTIOXIDANT) TABS Take 1 tablet by mouth daily.     naloxone (NARCAN) nasal spray 4 mg/0.1 mL Place 1 spray into the nose as needed for up to 365 doses (for opioid-induced respiratory depresssion). In case of emergency (overdose), spray once into each nostril. If no response within 3 minutes, repeat application and call 911. 1 each 0   naproxen (NAPROSYN) 500 MG tablet Take 1 tablet (500 mg total) by mouth daily. 90 tablet 1    omeprazole (PRILOSEC) 20 MG capsule Take 1 capsule (20 mg total) by mouth daily. 90 capsule 3   Oxycodone HCl 10 MG TABS Take 1 tablet (10 mg total) by mouth every 8 (eight) hours as needed. Each refill must last 30 days. 90 tablet 0   [START ON 04/08/2022] Oxycodone HCl 10 MG TABS Take 1 tablet (10 mg total) by mouth every 8 (eight) hours as needed. Each refill must last 30 days. 90 tablet 0   potassium citrate (UROCIT-K) 10 MEQ (1080 MG) SR tablet Take 1 tablet (10 mEq total) by mouth 2 (two) times daily. 180 tablet 3   rOPINIRole (REQUIP) 2 MG tablet Take 1 tablet (2 mg total) by mouth at bedtime. 90 tablet 3   Oxycodone HCl 10 MG TABS Take 1 tablet (10 mg total) by mouth every 8 (eight) hours as needed. Each refill must last 30 days. 90 tablet 0   No facility-administered medications prior to visit.     Per HPI unless specifically indicated in ROS section below Review of Systems  Objective:  BP 122/78   Pulse 86   Temp (!) 97.5 F (36.4 C) (Temporal)   Ht 5' 6.75" (1.695 m)   Wt 219 lb 8 oz (99.6 kg)   SpO2 94%   BMI 34.64 kg/m   Wt Readings from Last 3 Encounters:  03/27/22 219 lb 8 oz (99.6 kg)  12/19/21 222 lb 4 oz (100.8 kg)  12/12/21 225 lb (102.1 kg)      Physical Exam Vitals and nursing note reviewed.  Constitutional:      Appearance: Normal appearance. He is not ill-appearing.  Eyes:     Extraocular Movements: Extraocular movements intact.     Conjunctiva/sclera: Conjunctivae normal.     Pupils: Pupils are equal, round, and reactive to light.  Cardiovascular:     Rate and Rhythm: Normal rate and regular rhythm.     Pulses: Normal pulses.     Heart sounds: Normal heart sounds. No murmur heard. Pulmonary:     Effort: Pulmonary effort is normal. No respiratory distress.     Breath sounds: Normal breath sounds. No wheezing, rhonchi or rales.  Musculoskeletal:     Right lower leg: No edema.     Left lower leg: No edema.     Comments: See HPI for foot exam if done   Skin:    General: Skin is warm and dry.     Findings: No rash.  Neurological:     Mental Status: He is alert.  Psychiatric:        Mood and Affect: Mood normal.        Behavior: Behavior normal.       Results for orders placed or performed in visit on 03/27/22  POCT glycosylated hemoglobin (Hb A1C)  Result Value Ref Range  Hemoglobin A1C 6.1 (A) 4.0 - 5.6 %   HbA1c POC (<> result, manual entry)     HbA1c, POC (prediabetic range)     HbA1c, POC (controlled diabetic range)      Assessment & Plan:   Problem List Items Addressed This Visit     Obesity, Class I, BMI 30.0-34.9 (see actual BMI)    Continue to encourage healthy diet and lifestyle choices.       Hypertension    Chronic, great control on current regimen of amlodipine 2.5mg  daily and lasix 20mg  daily.       OSA (obstructive sleep apnea)    Severe OSA, previously on BiPAP, has always had difficulty tolerating mask. Interested in evaluation for Inspire device - will refer to [facility] who he's previously seen.  Previously saw [facility]ology ) with latest sleep study 2019.       Positive colorectal cancer screening using Cologuard test    Still needs to call GI to schedule colonoscopy - encouraged he do this.       Type 2 diabetes mellitus with other specified complication (HCC) - Primary    Great control to date with A1c down to 6.1% - congratulated! Foot exam today Will have him return for diabetic retinopathy screen at our office.  Continue low dose metformin XR.       Relevant Orders   POCT glycosylated hemoglobin (Hb A1C) (Completed)   Other Visit Diagnoses     Need for vaccination against Streptococcus pneumoniae       Relevant Orders   Pneumococcal conjugate vaccine 20-valent (Completed)        No orders of the defined types were placed in this encounter.  Orders Placed This Encounter  Procedures   Pneumococcal conjugate vaccine 20-valent   POCT glycosylated hemoglobin (Hb  A1C)     Patient Instructions  Prevnar-20 today.  Good to see you today. Congratulations on sugar control!  Continue following low sugar low carb diet.  We will call to schedule a diabetic retinopathy.   We will refer you to ENT to discuss eligibility for inspire device.  Good to see you today. Return as needed or in 5 months for diabetes follow up visit   Follow up plan: Return in about 5 months (around 08/27/2022) for follow up visit.  08/29/2022, MD

## 2022-03-27 NOTE — Assessment & Plan Note (Signed)
Continue to encourage healthy diet and lifestyle choices.  

## 2022-03-27 NOTE — Assessment & Plan Note (Signed)
Chronic, great control on current regimen of amlodipine 2.5mg  daily and lasix 20mg  daily.

## 2022-03-27 NOTE — Assessment & Plan Note (Addendum)
Great control to date with A1c down to 6.1% - congratulated! Foot exam today Will have him return for diabetic retinopathy screen at our office.  Continue low dose metformin XR.

## 2022-03-27 NOTE — Assessment & Plan Note (Signed)
Still needs to call GI to schedule colonoscopy - encouraged he do this.

## 2022-03-27 NOTE — Assessment & Plan Note (Addendum)
Severe OSA, previously on BiPAP, has always had difficulty tolerating mask. Interested in evaluation for Inspire device - will refer to [facility] who he's previously seen.  Previously saw [facility]ology Nicholos Johns) with latest sleep study 2019.

## 2022-03-31 ENCOUNTER — Telehealth: Payer: Self-pay

## 2022-03-31 MED ORDER — FLUOXETINE HCL 20 MG PO CAPS
20.0000 mg | ORAL_CAPSULE | Freq: Every day | ORAL | 3 refills | Status: DC
Start: 2022-03-31 — End: 2022-12-25

## 2022-03-31 NOTE — Telephone Encounter (Signed)
E-scribed refill 

## 2022-04-06 NOTE — Telephone Encounter (Signed)
Error

## 2022-04-13 ENCOUNTER — Telehealth: Payer: Self-pay

## 2022-04-13 NOTE — Progress Notes (Signed)
Chronic Care Management Pharmacy Assistant   Name: Mark [name]  MRN: [id number] DOB: 1956-05-01  Reason for Encounter: CCM (Diabetes Disease State)  Recent office visits:  03/27/22 [name], MD DM A1c 6.1 No med changes Foot exam complete FU 5 months  Recent consult visits:  None since last CCM contact  Hospital visits:  None in previous 6 months  Medications: Outpatient Encounter Medications as of 04/13/2022  Medication Sig   amLODipine (NORVASC) 2.5 MG tablet Take 1 tablet (2.5 mg total) by mouth daily.   Ascorbic Acid (VITAMIN C) 1000 MG tablet Take 1,000 mg by mouth daily.   aspirin 81 MG tablet Take 81 mg by mouth daily.   atorvastatin (LIPITOR) 20 MG tablet Take 1 tablet (20 mg total) by mouth at bedtime.   cetirizine (ZYRTEC) 10 MG tablet Take 10 mg by mouth at bedtime.    clindamycin (CLEOCIN) 150 MG capsule Take 150 mg by mouth as needed. Prior to dental appt   cyclobenzaprine (FLEXERIL) 10 MG tablet TAKE ONE-HALF TO ONE TABLET BY MOUTH 2 TIMES DAILY AS NEEDED FOR MUSCLE SPASMS   ferrous sulfate 325 (65 FE) MG EC tablet Take 325 mg by mouth daily with breakfast.   FLUoxetine (PROZAC) 20 MG capsule Take 1 capsule (20 mg total) by mouth daily.   FLUoxetine (PROZAC) 40 MG capsule Take 1 capsule (40 mg total) by mouth daily.   fluticasone (FLONASE) 50 MCG/ACT nasal spray Place 2 sprays into both nostrils daily.   furosemide (LASIX) 20 MG tablet Take 1 tablet (20 mg total) by mouth daily.   metFORMIN (GLUCOPHAGE-XR) 500 MG 24 hr tablet Take 1 tablet (500 mg total) by mouth daily with breakfast.   Multiple Vitamins-Minerals (CERTAVITE SENIOR/ANTIOXIDANT) TABS Take 1 tablet by mouth daily.   naloxone (NARCAN) nasal spray 4 mg/0.1 mL Place 1 spray into the nose as needed for up to 365 doses (for opioid-induced respiratory depresssion). In case of emergency (overdose), spray once into each nostril. If no response within 3 minutes, repeat application and call 911.    naproxen (NAPROSYN) 500 MG tablet Take 1 tablet (500 mg total) by mouth daily.   omeprazole (PRILOSEC) 20 MG capsule Take 1 capsule (20 mg total) by mouth daily.   Oxycodone HCl 10 MG TABS Take 1 tablet (10 mg total) by mouth every 8 (eight) hours as needed. Each refill must last 30 days.   Oxycodone HCl 10 MG TABS Take 1 tablet (10 mg total) by mouth every 8 (eight) hours as needed. Each refill must last 30 days.   Oxycodone HCl 10 MG TABS Take 1 tablet (10 mg total) by mouth every 8 (eight) hours as needed. Each refill must last 30 days.   potassium citrate (UROCIT-K) 10 MEQ (1080 MG) SR tablet Take 1 tablet (10 mEq total) by mouth 2 (two) times daily.   rOPINIRole (REQUIP) 2 MG tablet Take 1 tablet (2 mg total) by mouth at bedtime.   No facility-administered encounter medications on file as of 04/13/2022.   Recent Relevant Labs: Lab Results  Component Value Date/Time   HGBA1C 6.1 (A) 03/27/2022 02:33 PM   HGBA1C 7.8 (H) 12/12/2021 09:07 AM   HGBA1C 5.6 09/07/2020 01:16 PM   MICROALBUR 1.3 12/12/2021 09:07 AM   MICROALBUR 12.9 (H) 04/28/2014 10:35 AM    Kidney Function Lab Results  Component Value Date/Time   CREATININE 0.90 12/12/2021 09:07 AM   CREATININE 1.06 10/05/2021 02:45 PM   CREATININE 1.01 12/09/2013 04:01 PM  GFR 89.99 12/12/2021 09:07 AM   GFRNONAA >60 10/29/2020 01:42 AM   GFRNONAA >60 12/09/2013 04:01 PM   GFRAA >60 12/06/2017 10:40 AM   GFRAA >60 12/09/2013 04:01 PM    Contacted patient on 04/25/2022 to discuss diabetes disease state.   Current antihyperglycemic regimen:  Metformin XR 500 mg daily    Patient verbally confirms he is taking the above medications as directed. Yes  What diet changes have been made to improve diabetes control? Patient is not really watching what he is eating. He is not using salt due to his high blood pressure.   What recent interventions/DTPs have been made to improve glycemic control:  Continue current medications  Have there  been any recent hospitalizations or ED visits since last visit with CPP? No  Patient denies hypoglycemic symptoms, including Pale, Sweaty, Shaky, Hungry, Nervous/irritable, and Vision changes  Patient denies hyperglycemic symptoms, including blurry vision, excessive thirst, fatigue, polyuria, and weakness  How often are you checking your blood sugar? Patient was advised by Dr. [name] to only check his blood sugar when he feels bad or can tell his sugar is low. Patient has not felt bad so he has not had to check it. Patient has been feeling great.   Are you checking your feet daily/regularly? Yes  Adherence Review: Is the patient currently on a STATIN medication? Yes Is the patient currently on ACE/ARB medication? Yes Does the patient have >5 day gap between last estimated fill dates? No  Care Gaps: Annual wellness visit in last year? Yes 10/31/2021 Most recent A1C reading: 6.1 on 03/27/2022 Most Recent BP reading: 122/78 on 03/27/2022  Last eye exam / retinopathy screening: Never done Last diabetic foot exam: Up to date  Star Rating Drugs: Medication:                Last Fill:         Day Supply Atorvastatin 20 mg      01/12/2022      90 Metformin 500 mg       02/09/2022      30   CCM appointment on 05/30/2022  [name], CPP notified   Claudina Lick, [place] Clinical Pharmacy Assistant [phone]

## 2022-04-19 LAB — HM DIABETES EYE EXAM

## 2022-04-26 ENCOUNTER — Encounter: Payer: Self-pay | Admitting: Primary Care

## 2022-04-27 ENCOUNTER — Encounter: Payer: Self-pay | Admitting: Family Medicine

## 2022-04-27 NOTE — Progress Notes (Signed)
PROVIDER NOTE: Information contained herein reflects review and annotations entered in association with encounter. Interpretation of such information and data should be left to medically-trained personnel. Information provided to patient can be located elsewhere in the medical record under "Patient Instructions". Document created using STT-dictation technology, any transcriptional errors that may result from process are unintentional.    Patient: Mark [name]  Service Category: E/M  Provider: Gaspar Cola, MD  DOB: 27-Feb-1956  DOS: 05/01/2022  Referring Provider: Ria Bush, MD  MRN: [id number]  Specialty: Interventional Pain Management  PCP: Ria Bush, MD  Type: Established Patient  Setting: Ambulatory outpatient    Location: Office  Delivery: Face-to-face     HPI  Mr. ESAW KNIPPEL, a 66 y.o. year old male, is here today because of his Chronic pain syndrome [G89.4]. Mr. Birdsall primary complain today is Neck Pain Last encounter: My last encounter with him was on 12/12/2021. Pertinent problems: Mr. 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); Cervicogenic headache; Cervical spondylosis with radiculopathy (Bilateral) (L>R); Cervical facet syndrome (Bilateral) (L>R); Cervical foraminal stenosis (Severe C6-7) (Left); Chronic sacroiliac joint pain (Bilateral); Osteoarthritis of sacroiliac joint (Bilateral); Lumbar facet hypertrophy (L1-2, L2-3, and L4-5) (Bilateral); Lumbar IVDD (intervertebral disc displacement); 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); Lumbar facet syndrome (Bilateral); Osteoarthritis of shoulder (Left); Osteoarthritis of knee (Bilateral) (R>L); Chronic cervical radicular pain; Chronic fatigue; RLS (restless legs syndrome); Trigger finger of index finger (Right);  Cervicalgia; Trigger finger of thumb (Right); Chronic thumb pain (Left); Trigger middle finger of right hand; Chronic hand pain (Left); Osteoarthritis of first carpometacarpal joint (thumb) of hand (Left); Trigger point with back pain (Left); Trigger point of shoulder region (scapula) (Left); History of laparoscopic cholecystectomy (10/27/2020); DDD (degenerative disc disease), cervical; Chronic low back pain (Bilateral) w/o sciatica; and Chronic shoulder pain (Bilateral) (L>R) on their pertinent problem list. Pain Assessment: Severity of Chronic pain is reported as a 3 /10. Location: Neck  /left shoulder. Onset: More than a month ago. Quality: Aching, Dull. Timing: Intermittent. Modifying factor(s): rest, Oxycodone. Vitals:  height is 5' 7"  (1.702 m) and weight is 219 lb (99.3 kg). His temporal temperature is 97.3 F (36.3 C) (abnormal). His blood pressure is 152/78 (abnormal) and his pulse is 69. His respiration is 18 and oxygen saturation is 99%.   Reason for encounter: medication management.  The patient indicates doing well with the current medication regimen. No adverse reactions or side effects reported to the medications. Left shoulder has improved with PT.   RTCB: 08/06/2022  Pharmacotherapy Assessment  Analgesic: Oxycodone IR 10 mg, 1 tab PO BID to TID (20-30 mg/day total of oxycodone) MME/day: 60 mg/day.   Monitoring: [place] PMP: PDMP reviewed during this encounter.       Pharmacotherapy: No side-effects or adverse reactions reported. Compliance: No problems identified. Effectiveness: Clinically acceptable.  Landis Martins, RN  05/01/2022  1:38 PM  Sign when Signing Visit Nursing Pain Medication Assessment:  Safety precautions to be maintained throughout the outpatient stay will include: orient to surroundings, keep bed in low position, maintain call bell within reach at all times, provide assistance with transfer out of bed and ambulation.  Medication Inspection Compliance: Pill count  conducted under aseptic conditions, in front of the patient. Neither the pills nor the bottle was removed from the patient's sight at any time. Once count was  completed pills were immediately returned to the patient in their original bottle.  Medication: Oxycodone IR Pill/Patch Count:  42 of 90 pills remain Pill/Patch Appearance: Markings consistent with prescribed medication Bottle Appearance: Standard pharmacy container. Clearly labeled. Filled Date: 09 / 05 / 2023 Last Medication intake:  Today    No results found for: "CBDTHCR" No results found for: "D8THCCBX" No results found for: "D9THCCBX"  UDS:  Summary  Date Value Ref Range Status  12/12/2021 Note  Final    Comment:    ==================================================================== [employer] Select 13 (MW) ==================================================================== Test                             Result       Flag       Units  Drug Present and Declared for Prescription Verification   Oxycodone                      4111         EXPECTED   ng/mg creat   Noroxycodone                   6306         EXPECTED   ng/mg creat    Sources of oxycodone include scheduled prescription medications.    Noroxycodone is an expected metabolite of oxycodone.  ==================================================================== Test                      Result    Flag   Units      Ref Range   Creatinine              54               mg/dL      >=20 ==================================================================== Declared Medications:  The flagging and interpretation on this report are based on the  following declared medications.  Unexpected results may arise from  inaccuracies in the declared medications.   **Note: The testing scope of this panel includes these medications:   Oxycodone   **Note: The testing scope of this panel does not include the  following reported medications:   Amlodipine (Norvasc)  Aspirin   Atorvastatin (Lipitor)  Cetirizine (Zyrtec)  Clindamycin (Cleocin)  Cranberry  Cyclobenzaprine (Flexeril)  Fluoxetine (Prozac)  Fluticasone (Flonase)  Furosemide (Lasix)  Iron  Multivitamin  Naloxone (Narcan)  Naproxen (Naprosyn)  Omeprazole (Prilosec)  Potassium  Ropinirole (Requip)  Supplement  Vitamin C  Vitamin D ==================================================================== For clinical consultation, please call [phone]. ====================================================================       ROS  Constitutional: Denies any fever or chills Gastrointestinal: No reported hemesis, hematochezia, vomiting, or acute GI distress Musculoskeletal: Denies any acute onset joint swelling, redness, loss of ROM, or weakness Neurological: No reported episodes of acute onset apraxia, aphasia, dysarthria, agnosia, amnesia, paralysis, loss of coordination, or loss of consciousness  Medication Review  B Complex-C, CertaVite Senior/Antioxidant, Cholecalciferol, Cyanocobalamin, FLUoxetine, Fish Oil, Garlic, Oxycodone HCl, amLODipine, aspirin, atorvastatin, cetirizine, clindamycin, cyclobenzaprine, ferrous sulfate, fluticasone, furosemide, metFORMIN, naloxone, naproxen, omeprazole, potassium citrate, rOPINIRole, and vitamin C  History Review  Allergy: Mr. Kenyon is allergic to ciprofloxacin, losartan, metformin, buprenorphine hcl, morphine and related, and penicillins. Drug: Mr. Friedel  reports no history of drug use. Alcohol:  reports no history of alcohol use. Tobacco:  reports that he has never smoked. He has never used smokeless tobacco. Social: Mr. Kesling  reports that he has never  smoked. He has never used smokeless tobacco. He reports that he does not drink alcohol and does not use drugs. Medical:  has a past medical history of Anxiety, Arthritis, Chronic pain, Depression, Heart murmur, History of kidney stones (April 12,2017), History of permanent cardiac pacemaker  placement (05/31/2015), Hydrocele in adult (05/07/2015), Kidney stones, Migraine, MRSA carrier (2019), Presence of permanent cardiac pacemaker, Prostatitis, and Sleep apnea. Surgical: Mr. Denner  has a past surgical history that includes Tonsillectomy (1964); Carpal tunnel release (Bilateral, 2006); Ulnar nerve repair (2008); Cervical spine surgery (2008); Rotator cuff repair (Left, 2009); pacemaker placement (2009); Lithotripsy (Bilateral, 2012); Lithotripsy (Left, 11/2015); Cardiovascular stress test (2014); Colonoscopy (07/2007); implantable cardioverter defibrillator (icd) generator change (Left, 12/13/2017); pace maker revision (12/13/2017); Laparoscopic cholecystectomy (10/2020); Cholecystectomy (N/A, 10/27/2020); ERCP (N/A, 10/26/2020); Esophagogastroduodenoscopy (egd) with propofol (N/A, 10/26/2020); EUS (10/26/2020); Sphincterotomy (10/26/2020); and removal of stones (10/26/2020). Family: family history includes Arthritis in his father and mother; Cancer in his father and mother; Diabetes in his mother; Heart block in his father; Heart disease in his father and mother.  Laboratory Chemistry Profile   Renal Lab Results  Component Value Date   BUN 20 12/12/2021   CREATININE 0.90 12/12/2021   GFR 89.99 12/12/2021   GFRAA >60 12/06/2017   GFRNONAA >60 10/29/2020    Hepatic Lab Results  Component Value Date   AST 24 10/05/2021   ALT 28 10/05/2021   ALBUMIN 4.1 10/05/2021   ALKPHOS 67 10/05/2021   HCVAB NEGATIVE 07/22/2013   LIPASE 39 10/28/2020    Electrolytes Lab Results  Component Value Date   NA 138 12/12/2021   K 3.8 12/12/2021   CL 100 12/12/2021   CALCIUM 9.1 12/12/2021   MG 2.1 09/01/2015    Bone Lab Results  Component Value Date   25OHVITD1 63 02/02/2016   25OHVITD2 <1.0 02/02/2016   25OHVITD3 63 02/02/2016   TESTOSTERONE 216.29 (L) 02/13/2017    Inflammation (CRP: Acute Phase) (ESR: Chronic Phase) Lab Results  Component Value Date   CRP 1.7 (H) 09/01/2015   ESRSEDRATE 6  09/01/2015         Note: Above Lab results reviewed.  Recent Imaging Review  Abdomen 1 view (KUB) CLINICAL DATA:  Recheck of kidney stones.  EXAM: ABDOMEN - 1 VIEW  COMPARISON:  Flat plate study 27/51/7001.  FINDINGS: Interval cholecystectomy. Clustered stones are again noted in the lower pole of both kidneys measuring up to 1.1 cm on the left and up to 1.4 cm on the right. There are no appreciable stones in the upper poles. Bilateral pelvic phleboliths are unchanged.  The bowel gas pattern is nonobstructive. Visceral shadows are stable. There is no supine evidence of free air. There are degenerative changes of the spine.  IMPRESSION: Bilateral nephrolithiasis in the lower poles. No interval changes are seen. No pathologic calcification is seen in the plane of either ureter. Interval cholecystectomy.  Electronically Signed   By: Telford Nab M.D.   On: 09/12/2021 23:05 Note: Reviewed        Physical Exam  General appearance: Well nourished, well developed, and well hydrated. In no apparent acute distress Mental status: Alert, oriented x 3 (person, place, & time)       Respiratory: No evidence of acute respiratory distress Eyes: PERLA Vitals: BP (!) 152/78   Pulse 69   Temp (!) 97.3 F (36.3 C) (Temporal)   Resp 18   Ht 5' 7"  (1.702 m)   Wt 219 lb (99.3 kg)   SpO2 99%  BMI 34.30 kg/m  BMI: Estimated body mass index is 34.3 kg/m as calculated from the following:   Height as of this encounter: 5' 7"  (1.702 m).   Weight as of this encounter: 219 lb (99.3 kg). Ideal: Ideal body weight: 66.1 kg (145 lb 11.6 oz) Adjusted ideal body weight: 79.4 kg (175 lb 0.6 oz)  Assessment   Diagnosis Status  1. Chronic pain syndrome   2. Chronic neck pain (1ry area of Pain) (Bilateral) (L>R)   3. 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 Cervicalgia   5. Cervicogenic headache   6. Chronic cervical radicular pain   7. Chronic low back pain  (Bilateral) w/o sciatica   8. Chronic sacroiliac joint pain (Bilateral)   9. Chronic shoulder pain (Bilateral) (L>R)   10. Lumbar facet syndrome (Bilateral)   11. Pharmacologic therapy   12. Chronic use of opiate for therapeutic purpose   13. Encounter for medication management   14. Encounter for chronic pain management    Controlled Controlled Controlled   Updated Problems: No problems updated.   Plan of Care  Problem-specific:  No problem-specific Assessment & Plan notes found for this encounter.  Mr. ELLIAS MCELREATH has a current medication list which includes the following long-term medication(s): amlodipine, atorvastatin, cetirizine, fluoxetine, fluoxetine, fluticasone, furosemide, metformin, omeprazole, [START ON 05/08/2022] oxycodone hcl, [START ON 06/07/2022] oxycodone hcl, [START ON 07/07/2022] oxycodone hcl, and ropinirole.  Pharmacotherapy (Medications Ordered): Meds ordered this encounter  Medications   Oxycodone HCl 10 MG TABS    Sig: Take 1 tablet (10 mg total) by mouth every 8 (eight) hours as needed. Each refill must last 30 days.    Dispense:  90 tablet    Refill:  0    DO NOT: delete (not duplicate); no partial-fill (will deny script to complete), no refill request (F/U required). DISPENSE: 1 day early if closed on fill date. WARN: No CNS-depressants within 8 hrs of med.   Oxycodone HCl 10 MG TABS    Sig: Take 1 tablet (10 mg total) by mouth every 8 (eight) hours as needed. Each refill must last 30 days.    Dispense:  90 tablet    Refill:  0    DO NOT: delete (not duplicate); no partial-fill (will deny script to complete), no refill request (F/U required). DISPENSE: 1 day early if closed on fill date. WARN: No CNS-depressants within 8 hrs of med.   Oxycodone HCl 10 MG TABS    Sig: Take 1 tablet (10 mg total) by mouth every 8 (eight) hours as needed. Each refill must last 30 days.    Dispense:  90 tablet    Refill:  0    DO NOT: delete (not duplicate); no  partial-fill (will deny script to complete), no refill request (F/U required). DISPENSE: 1 day early if closed on fill date. WARN: No CNS-depressants within 8 hrs of med.   Orders:  No orders of the defined types were placed in this encounter.  Follow-up plan:   Return in about 3 months (around 07/31/2022) for med refill.     Interventional Therapies  Risk  Complexity Considerations:   Estimated body mass index is 35.24 kg/m as calculated from the following:   Height as of this encounter: 5' 7"  (1.702 m).   Weight as of this encounter: 225 lb (102.1 kg). WNL   Planned  Pending:      Under consideration:   Diagnostic right hand digit 2 trigger finger injection  Therapeutic bilateral IA Hyalgan knee injections  Diagnostic bilateral cervical facet block  Possible bilateral cervical facet RFA  Diagnostic left sided CESI  Diagnostic bilateral IA knee injection  Diagnostic bilateral genicular NB  Possible bilateral genicular nerve RFA  Diagnostic bilateral sacroiliac joint block  Possible bilateral sacroiliac joint RFA  Diagnostic bilateral IA shoulder joint injection  Diagnostic bilateral suprascapular NB  Possible bilateral suprascapular nerve RFA  Diagnostic bilateral lumbar facet block  Possible bilateral lumbar facet RFA  Diagnostic left L4-5 LESI    Completed:   Therapeutic left levator scapula, supraspinatus, and rhomboid muscle TPI/MNB x1 (03/30/2020) (100/100/85/75)  Diagnostic left C7-T1 cervical ESI x1 (06/21/2016) (90/90/70/75)  Diagnostic bilateral IA Hyalgan knee inj. x3 (06/21/2016) (left: 100/90/100/90/75) (right: 90/90/80/75)  Diagnostic/therapeutic right thumb inj. x1 (05/23/2016) (n/a) Diagnostic/therapeutic left thumb (CMC-Trapezium) inj. x1 (03/11/2020) (100/100/75/75)    Therapeutic  Palliative (PRN) options:   Therapeutic IA Hyalgan knee injections      Recent Visits No visits were found meeting these conditions. Showing recent visits within past 90  days and meeting all other requirements Today's Visits Date Type Provider Dept  05/01/22 Office Visit Milinda Pointer, MD [facility]  Showing today's visits and meeting all other requirements Future Appointments Date Type Provider Dept  07/24/22 Appointment Milinda Pointer, MD [facility]  Showing future appointments within next 90 days and meeting all other requirements  I discussed the assessment and treatment plan with the patient. The patient was provided an opportunity to ask questions and all were answered. The patient agreed with the plan and demonstrated an understanding of the instructions.  Patient advised to call back or seek an in-person evaluation if the symptoms or condition worsens.  Duration of encounter: 30 minutes.  Total time on encounter, as per AMA guidelines included both the face-to-face and non-face-to-face time personally spent by the physician and/or other qualified health care professional(s) on the day of the encounter (includes time in activities that require the physician or other qualified health care professional and does not include time in activities normally performed by clinical staff). Physician's time may include the following activities when performed: preparing to see the patient (eg, review of tests, pre-charting review of records) obtaining and/or reviewing separately obtained history performing a medically appropriate examination and/or evaluation counseling and educating the patient/family/caregiver ordering medications, tests, or procedures referring and communicating with other health care professionals (when not separately reported) documenting clinical information in the electronic or other health record independently interpreting results (not separately reported) and communicating results to the patient/ family/caregiver care coordination (not separately reported)  Note by: Gaspar Cola, MD Date: 05/01/2022;  Time: 3:41 PM

## 2022-05-01 ENCOUNTER — Ambulatory Visit: Payer: Medicare Other | Attending: Pain Medicine | Admitting: Pain Medicine

## 2022-05-01 ENCOUNTER — Encounter: Payer: Self-pay | Admitting: Pain Medicine

## 2022-05-01 VITALS — BP 152/78 | HR 69 | Temp 97.3°F | Resp 18 | Ht 67.0 in | Wt 219.0 lb

## 2022-05-01 DIAGNOSIS — M25511 Pain in right shoulder: Secondary | ICD-10-CM | POA: Diagnosis not present

## 2022-05-01 DIAGNOSIS — M542 Cervicalgia: Secondary | ICD-10-CM | POA: Diagnosis not present

## 2022-05-01 DIAGNOSIS — M47816 Spondylosis without myelopathy or radiculopathy, lumbar region: Secondary | ICD-10-CM | POA: Diagnosis not present

## 2022-05-01 DIAGNOSIS — M545 Low back pain, unspecified: Secondary | ICD-10-CM | POA: Insufficient documentation

## 2022-05-01 DIAGNOSIS — M25512 Pain in left shoulder: Secondary | ICD-10-CM | POA: Diagnosis not present

## 2022-05-01 DIAGNOSIS — G4486 Cervicogenic headache: Secondary | ICD-10-CM | POA: Diagnosis not present

## 2022-05-01 DIAGNOSIS — G894 Chronic pain syndrome: Secondary | ICD-10-CM | POA: Diagnosis not present

## 2022-05-01 DIAGNOSIS — M533 Sacrococcygeal disorders, not elsewhere classified: Secondary | ICD-10-CM | POA: Insufficient documentation

## 2022-05-01 DIAGNOSIS — Z79899 Other long term (current) drug therapy: Secondary | ICD-10-CM | POA: Insufficient documentation

## 2022-05-01 DIAGNOSIS — G8929 Other chronic pain: Secondary | ICD-10-CM | POA: Insufficient documentation

## 2022-05-01 DIAGNOSIS — M961 Postlaminectomy syndrome, not elsewhere classified: Secondary | ICD-10-CM | POA: Insufficient documentation

## 2022-05-01 DIAGNOSIS — M5412 Radiculopathy, cervical region: Secondary | ICD-10-CM | POA: Insufficient documentation

## 2022-05-01 DIAGNOSIS — Z79891 Long term (current) use of opiate analgesic: Secondary | ICD-10-CM | POA: Diagnosis not present

## 2022-05-01 MED ORDER — OXYCODONE HCL 10 MG PO TABS: 10.0000 mg | ORAL_TABLET | Freq: Three times a day (TID) | ORAL | 0 refills | Status: DC | PRN

## 2022-05-01 NOTE — Progress Notes (Signed)
Nursing Pain Medication Assessment:  Safety precautions to be maintained throughout the outpatient stay will include: orient to surroundings, keep bed in low position, maintain call bell within reach at all times, provide assistance with transfer out of bed and ambulation.  Medication Inspection Compliance: Pill count conducted under aseptic conditions, in front of the patient. Neither the pills nor the bottle was removed from the patient's sight at any time. Once count was completed pills were immediately returned to the patient in their original bottle.  Medication: Oxycodone IR Pill/Patch Count:  42 of 90 pills remain Pill/Patch Appearance: Markings consistent with prescribed medication Bottle Appearance: Standard pharmacy container. Clearly labeled. Filled Date: 09 / 05 / 2023 Last Medication intake:  Today

## 2022-05-01 NOTE — Patient Instructions (Signed)

## 2022-05-04 ENCOUNTER — Telehealth: Payer: Self-pay

## 2022-05-04 MED ORDER — METFORMIN HCL ER 500 MG PO TB24: 500.0000 mg | ORAL_TABLET | Freq: Every day | ORAL | 2 refills | Status: DC

## 2022-05-04 NOTE — Telephone Encounter (Signed)
E-scribed refill 

## 2022-05-16 ENCOUNTER — Other Ambulatory Visit: Payer: Self-pay

## 2022-05-16 NOTE — Telephone Encounter (Signed)
Cyclobenzaprine  #40 12/09/2021 no RF Furosemide #90 10/27/21 4RF

## 2022-05-17 MED ORDER — FUROSEMIDE 20 MG PO TABS: 20.0000 mg | ORAL_TABLET | Freq: Every day | ORAL | 4 refills | Status: DC

## 2022-05-17 MED ORDER — CYCLOBENZAPRINE HCL 10 MG PO TABS: ORAL_TABLET | ORAL | 0 refills | Status: DC

## 2022-05-25 ENCOUNTER — Telehealth: Payer: Self-pay

## 2022-05-25 NOTE — Progress Notes (Signed)
Chronic Care Management Pharmacy Assistant   Name: Mark [name]  MRN: [id number] DOB: 28-Sep-1955  Reason for Encounter: CCM (Appointment Reminder)  Medications: Outpatient Encounter Medications as of 05/25/2022  Medication Sig Note   amLODipine (NORVASC) 2.5 MG tablet Take 1 tablet (2.5 mg total) by mouth daily.    Ascorbic Acid (VITAMIN C) 1000 MG tablet Take 1,000 mg by mouth daily.    aspirin 81 MG tablet Take 81 mg by mouth daily.    atorvastatin (LIPITOR) 20 MG tablet Take 1 tablet (20 mg total) by mouth at bedtime.    B Complex-C (SUPER B COMPLEX/VITAMIN C PO) Take by mouth daily.    cetirizine (ZYRTEC) 10 MG tablet Take 10 mg by mouth at bedtime.     Cholecalciferol (VITAMIN D3 PO) Take 2,000 Int'l Units by mouth daily.    clindamycin (CLEOCIN) 150 MG capsule Take 150 mg by mouth as needed. Prior to dental appt    Cyanocobalamin (VITAMIN B12 PO) Take by mouth daily.    cyclobenzaprine (FLEXERIL) 10 MG tablet TAKE ONE-HALF TO ONE TABLET BY MOUTH 2 TIMES DAILY AS NEEDED FOR MUSCLE SPASMS    ferrous sulfate 325 (65 FE) MG EC tablet Take 325 mg by mouth daily with breakfast.    FLUoxetine (PROZAC) 20 MG capsule Take 1 capsule (20 mg total) by mouth daily.    FLUoxetine (PROZAC) 40 MG capsule Take 1 capsule (40 mg total) by mouth daily.    fluticasone (FLONASE) 50 MCG/ACT nasal spray Place 2 sprays into both nostrils daily.    furosemide (LASIX) 20 MG tablet Take 1 tablet (20 mg total) by mouth daily.    Garlic 710 MG TABS Take by mouth daily.    metFORMIN (GLUCOPHAGE-XR) 500 MG 24 hr tablet Take 1 tablet (500 mg total) by mouth daily with breakfast.    Multiple Vitamins-Minerals (CERTAVITE SENIOR/ANTIOXIDANT) TABS Take 1 tablet by mouth daily.    naloxone (NARCAN) nasal spray 4 mg/0.1 mL Place 1 spray into the nose as needed for up to 365 doses (for opioid-induced respiratory depresssion). In case of emergency (overdose), spray once into each nostril. If no response within 3  minutes, repeat application and call [phone].    naproxen (NAPROSYN) 500 MG tablet Take 1 tablet (500 mg total) by mouth daily.    Omega-3 Fatty Acids (FISH OIL) 1000 MG CAPS Take by mouth daily.    omeprazole (PRILOSEC) 20 MG capsule Take 1 capsule (20 mg total) by mouth daily.    Oxycodone HCl 10 MG TABS Take 1 tablet (10 mg total) by mouth every 8 (eight) hours as needed. Each refill must last 30 days.    [START ON 06/07/2022] Oxycodone HCl 10 MG TABS Take 1 tablet (10 mg total) by mouth every 8 (eight) hours as needed. Each refill must last 30 days.    [START ON 07/07/2022] Oxycodone HCl 10 MG TABS Take 1 tablet (10 mg total) by mouth every 8 (eight) hours as needed. Each refill must last 30 days. 05/01/2022: WARNING: Not a Duplicate. Future prescription. DO NOT DELETE during hospital medication reconciliation or at discharge. [facility] Management Patient    potassium citrate (UROCIT-K) 10 MEQ (1080 MG) SR tablet Take 1 tablet (10 mEq total) by mouth 2 (two) times daily.    rOPINIRole (REQUIP) 2 MG tablet Take 1 tablet (2 mg total) by mouth at bedtime.    No facility-administered encounter medications on file as of 05/25/2022.   Mark [name] was contacted  to remind of upcoming telephone visit with Mark [name] on 05/30/2022 at 3:45. Patient was reminded to have any blood glucose and blood pressure readings available for review at appointment.   Message was left reminding patient of appointment.  Are you having any problems with your medications? No   Do you have any concerns you like to discuss with the pharmacist? No  CCM referral has been placed prior to visit?  No   Star Rating Drugs: Medication:  Last Fill: Day Supply Atorvastatin 20 mg 01/12/22 90 - Verified with Mark [name] - Patient states has plenty on hand as he had an excess Metformin 500 mg 05/11/22 30  Mark [name], CPP notified  Claudina Lick, [place] Clinical Pharmacy Assistant [phone]

## 2022-05-30 ENCOUNTER — Ambulatory Visit: Payer: Medicare Other | Admitting: Pharmacist

## 2022-05-30 DIAGNOSIS — I10 Essential (primary) hypertension: Secondary | ICD-10-CM

## 2022-05-30 DIAGNOSIS — E1169 Type 2 diabetes mellitus with other specified complication: Secondary | ICD-10-CM

## 2022-05-30 NOTE — Progress Notes (Signed)
Chronic Care Management Pharmacy Note  05/30/2022 Name:  Mark [name] MRN:  [id number] DOB:  13-Sep-1955  Summary: CCM F/U visit -Reviewed medications; pt affirms compliance -DM: A1c 6.1% (03/2022), significantly improved with dietary changes and metformin -HTN/HF: BP <130/80 in recent primary care visit  Recommendations/Changes made from today's visit: -No med changes  Plan: -[place] will call patient 2 months for BP update -Pharmacist follow up televisit PRN -PCP appt 08/28/22    Subjective: Mark [name] is an 66 y.o. year old male who is a primary patient of Ria Bush, MD.  The CCM team was consulted for assistance with disease management and care coordination needs.    Engaged with patient by telephone for follow up visit in response to provider referral for pharmacy case management and/or care coordination services.   Consent to Services:  The patient was given information about Chronic Care Management services, agreed to services, and gave verbal consent prior to initiation of services.  Please see initial visit note for detailed documentation.   Patient Care Team: Ria Bush, MD as PCP - General (Family Medicine) Hyman Hopes, [place], [place] as Referring Physician (Dentistry) Milinda Pointer, MD as Referring Physician (Pain Medicine) Teodoro Spray, MD as Consulting Physician (Cardiology) Laverle Hobby, MD as Consulting Physician (Pulmonary Disease) Abbie Sons, MD (Urology) Leonie Green, OD as Referring Physician (Optometry) Charlton Haws, [facility] as Pharmacist (Pharmacist)  Patient lives alone, his mom lives next door and cooks for him occasionally. She had a stroke last year and he helps with her medications as well.  Recent office visits: 03/27/22 Dr Danise Mina OV: f/u - A1c 6.1%; foot exam done; eye exam at office; interested in Inspire device for OSA - referred to ENT. RTC 5 months.  12/19/21 Dr Danise Mina OV: annual  visit - A1c 7.8%, new DM dx. Start metformin XR 500 mg AM. Would be willing to retry losartan. RTC 3 months.  10/26/21 phone call - c/o side effects with losartan (muscle pain, insomnia, nausea, anxiety); Stop losartan, start amlodipine 2.5 mg.  10/05/21 Dr Danise Mina OV: chronic f/u; BP 142/76. Start Losartan 25 mg daily (uricosuric effect). BMP 10 days.  09/14/20 Dr Danise Mina OV: annual exam; referred to GI for Cologard  Recent consult visits: 05/01/22 Dr Dossie Arbour (Pain mgmt):  09/21/21 Dr Dossie Arbour (Pain mgmt): neck pain 09/12/21 Dr Bernardo Heater (Urology): bilateral nephrolithiasis, opted for surveillance. Continue K Citrate.   Hospital visits: None in previous 6 months   Objective:  Lab Results  Component Value Date   CREATININE 0.90 12/12/2021   BUN 20 12/12/2021   GFR 89.99 12/12/2021   GFRNONAA >60 10/29/2020   GFRAA >60 12/06/2017   NA 138 12/12/2021   K 3.8 12/12/2021   CALCIUM 9.1 12/12/2021   CO2 25 12/12/2021   GLUCOSE 243 (H) 12/12/2021    Lab Results  Component Value Date/Time   HGBA1C 6.1 (A) 03/27/2022 02:33 PM   HGBA1C 7.8 (H) 12/12/2021 09:07 AM   HGBA1C 5.6 09/07/2020 01:16 PM   GFR 89.99 12/12/2021 09:07 AM   GFR 74.04 10/05/2021 02:45 PM   MICROALBUR 1.3 12/12/2021 09:07 AM   MICROALBUR 12.9 (H) 04/28/2014 10:35 AM    Last diabetic Eye exam:  Lab Results  Component Value Date/Time   HMDIABEYEEXA No Retinopathy 04/19/2022 12:00 AM    Last diabetic Foot exam: No results found for: "HMDIABFOOTEX"   Lab Results  Component Value Date   CHOL 129 12/12/2021   HDL 41.80 12/12/2021   Boyd  46 09/07/2020   LDLDIRECT 64.0 12/12/2021   TRIG 222.0 (H) 12/12/2021   CHOLHDL 3 12/12/2021       Latest Ref Rng & Units 10/05/2021    2:45 PM 10/29/2020    1:42 AM 10/27/2020    1:26 AM  Hepatic Function  Total Protein 6.0 - 8.3 g/dL 6.5  5.8  5.4   Albumin 3.5 - 5.2 g/dL 4.1  2.6  2.6   AST 0 - 37 U/L 24  137  137   ALT 0 - 53 U/L 28  163  157   Alk Phosphatase 39 -  117 U/L 67  170  189   Total Bilirubin 0.2 - 1.2 mg/dL 0.7  2.6  5.3     Lab Results  Component Value Date/Time   TSH 4.26 12/12/2021 09:07 AM   TSH 2.05 08/09/2018 08:22 AM       Latest Ref Rng & Units 12/12/2021    9:07 AM 10/29/2020    1:42 AM 10/28/2020    9:45 AM  CBC  WBC 4.0 - 10.5 K/uL 6.3  14.1  15.7   Hemoglobin 13.0 - 17.0 g/dL 14.5  14.8  14.1   Hematocrit 39.0 - 52.0 % 41.4  43.7  40.4   Platelets 150.0 - 400.0 K/uL 177.0  151  163     No results found for: "VD25OH"  Clinical ASCVD: No  The ASCVD Risk score (Arnett DK, et al., 2019) failed to calculate for the following reasons:   The valid total cholesterol range is 130 to 320 mg/dL       05/01/2022    1:49 PM 10/31/2021   11:28 AM 09/08/2020    2:06 PM  Depression screen PHQ 2/9  Decreased Interest 0 1 0  Down, Depressed, Hopeless 0 1 0  PHQ - 2 Score 0 2 0  Altered sleeping  3   Tired, decreased energy  3   Change in appetite  0   Feeling bad or failure about yourself   0   Trouble concentrating  0   Moving slowly or fidgety/restless  0   Suicidal thoughts  0   PHQ-9 Score  8   Difficult doing work/chores  Somewhat difficult        02/11/2018    3:40 PM  GAD 7 : Generalized Anxiety Score  Nervous, Anxious, on Edge 1  Control/stop worrying 0  Worry too much - different things 1  Trouble relaxing 1  Restless 0  Easily annoyed or irritable 0  Afraid - awful might happen 0  Total GAD 7 Score 3    Social History   Tobacco Use  Smoking Status Never  Smokeless Tobacco Never   BP Readings from Last 3 Encounters:  05/01/22 (!) 152/78  03/27/22 122/78  12/19/21 136/72   Pulse Readings from Last 3 Encounters:  05/01/22 69  03/27/22 86  12/19/21 69   Wt Readings from Last 3 Encounters:  05/01/22 219 lb (99.3 kg)  03/27/22 219 lb 8 oz (99.6 kg)  12/19/21 222 lb 4 oz (100.8 kg)   BMI Readings from Last 3 Encounters:  05/01/22 34.30 kg/m  03/27/22 34.64 kg/m  12/19/21 35.07 kg/m     Assessment/Interventions: Review of patient past medical history, allergies, medications, health status, including review of consultants reports, laboratory and other test data, was performed as part of comprehensive evaluation and provision of chronic care management services.   SDOH:  (Social Determinants of Health) assessments and interventions performed: No -  done 10/2021 SDOH Interventions    Flowsheet Row Clinical Support from 10/31/2021 in [facility] at [place] from 08/27/2020 in [facility] at [place] from 08/20/2019 in [facility] [place] from 08/13/2017 in [facility] at [place] Interventions Intervention Not Indicated -- -- --  Housing Interventions Intervention Not Indicated -- -- --  Transportation Interventions Intervention Not Indicated -- -- --  Depression Interventions/Treatment  Currently on Treatment PHQ2-9 Score <4 Follow-up Not Indicated Currently on Treatment --  [referral to PCP]  Financial Strain Interventions Intervention Not Indicated -- -- --  Physical Activity Interventions Intervention Not Indicated -- -- --  Stress Interventions Intervention Not Indicated -- -- --  Social Connections Interventions Intervention Not Indicated -- -- --      SDOH Screenings   Food Insecurity: No Food Insecurity (10/31/2021)  Housing: Low Risk  (10/31/2021)  Transportation Needs: Unknown (10/31/2021)  Alcohol Screen: Low Risk  (10/31/2021)  Depression (PHQ2-9): Low Risk  (05/01/2022)  Financial Resource Strain: Low Risk  (10/31/2021)  Physical Activity: Insufficiently Active (10/31/2021)  Social Connections: Socially Isolated (10/31/2021)  Stress: No Stress Concern Present (10/31/2021)  Tobacco Use: Low Risk  (05/01/2022)    CCM Care Plan  Allergies  Allergen Reactions   Ciprofloxacin Other (See Comments)    PANIC ATTACKS  (Fluoroquinolones)    Losartan Other (See Comments)    Muscle pain, difficulty sleeping, anxiety, nausea   Metformin Other (See Comments)    Bad acid reflux   Buprenorphine Hcl Nausea Only   Morphine And Related Nausea Only   Penicillins Rash    Rash Has patient had a PCN reaction causing immediate rash, facial/tongue/throat swelling, SOB or lightheadedness with hypotension: Unknown Has patient had a PCN reaction causing severe rash involving mucus membranes or skin necrosis: Unknown Has patient had a PCN reaction that required hospitalization: No Has patient had a PCN reaction occurring within the last 10 years: No CHILDHOOD REACTION. If all of the above answers are "NO", then may proceed with Cephalosporin use.     Medications Reviewed Today     Reviewed by Charlton Haws, [facility] (Pharmacist) on 05/30/22 at [place] List Status: <None>   Medication Order Taking? Sig Documenting Provider Last Dose Status Informant  amLODipine (NORVASC) 2.5 MG tablet [id number]  Take 1 tablet (2.5 mg total) by mouth daily. Ria Bush, MD  Active   Ascorbic Acid (VITAMIN C) 1000 MG tablet [id number]  Take 1,000 mg by mouth daily. [provider]  Active Self  aspirin 81 MG tablet [id number]  Take 81 mg by mouth daily. [provider]  Active Self  atorvastatin (LIPITOR) 20 MG tablet [id number]  Take 1 tablet (20 mg total) by mouth at bedtime. Ria Bush, MD  Active   B Complex-C (SUPER B COMPLEX/VITAMIN C PO) [id number]  Take by mouth daily. [provider]  Active   cetirizine (ZYRTEC) 10 MG tablet [id number]  Take 10 mg by mouth at bedtime.  [provider]  Active Self  Cholecalciferol (VITAMIN D3 PO) [id number]  Take 2,000 Int'l Units by mouth daily. [provider]  Active   clindamycin (CLEOCIN) 150 MG capsule [id number]  Take 150 mg by mouth as needed. Prior to dental appt [provider]  Active   Cyanocobalamin (VITAMIN B12 PO)  [id number]  Take by mouth daily. [provider]  Active   cyclobenzaprine (FLEXERIL) 10 MG  tablet [id number]  TAKE ONE-HALF TO ONE TABLET BY MOUTH 2 TIMES DAILY AS NEEDED FOR MUSCLE SPASMS Ria Bush, MD  Active   ferrous sulfate 325 (65 FE) MG EC tablet [id number]  Take 325 mg by mouth daily with breakfast. [provider]  Active Self  FLUoxetine (PROZAC) 20 MG capsule [id number]  Take 1 capsule (20 mg total) by mouth daily. Ria Bush, MD  Active   FLUoxetine (PROZAC) 40 MG capsule [id number]  Take 1 capsule (40 mg total) by mouth daily. Ria Bush, MD  Active   fluticasone [facility]) 50 MCG/ACT nasal spray [id number]  Place 2 sprays into both nostrils daily. Ria Bush, MD  Active   furosemide (LASIX) 20 MG tablet [id number]  Take 1 tablet (20 mg total) by mouth daily. Ria Bush, MD  Active   Garlic 295 MG TABS [id number]  Take by mouth daily. [provider]  Active   metFORMIN (GLUCOPHAGE-XR) 500 MG 24 hr tablet [id number]  Take 1 tablet (500 mg total) by mouth daily with breakfast. Ria Bush, MD  Active   Multiple Vitamins-Minerals (CERTAVITE SENIOR/ANTIOXIDANT) TABS [id number]  Take 1 tablet by mouth daily. [provider]  Active Self  naloxone [facility]) nasal spray 4 mg/0.1 mL [id number]  Place 1 spray into the nose as needed for up to 365 doses (for opioid-induced respiratory depresssion). In case of emergency (overdose), spray once into each nostril. If no response within 3 minutes, repeat application and call 248. Milinda Pointer, MD  Active   naproxen (NAPROSYN) 500 MG tablet [id number]  Take 1 tablet (500 mg total) by mouth daily. Ria Bush, MD  Active   Omega-3 Fatty Acids (FISH OIL) 1000 MG CAPS [id number]  Take by mouth daily. [provider]  Active   omeprazole (PRILOSEC) 20 MG capsule [id number]  Take 1 capsule (20 mg total) by mouth daily. Ria Bush, MD  Active   Oxycodone HCl 10 MG TABS  [id number]  Take 1 tablet (10 mg total) by mouth every 8 (eight) hours as needed. Each refill must last 30 days. Milinda Pointer, MD  Active   Oxycodone HCl 10 MG TABS [id number]  Take 1 tablet (10 mg total) by mouth every 8 (eight) hours as needed. Each refill must last 30 days. Milinda Pointer, MD  Active   Oxycodone HCl 10 MG TABS [id number]  Take 1 tablet (10 mg total) by mouth every 8 (eight) hours as needed. Each refill must last 30 days. Milinda Pointer, MD  Active            Med Note Dossie Arbour, FRANCISCO A   Mon May 01, 2022  1:51 PM) WARNING: Not a Duplicate. Future prescription. DO NOT DELETE during hospital medication reconciliation or at discharge. [facility] Chronic Pain Management Patient   potassium citrate (UROCIT-K) 10 MEQ (1080 MG) SR tablet [id number]  Take 1 tablet (10 mEq total) by mouth 2 (two) times daily. Abbie Sons, MD  Active   rOPINIRole (REQUIP) 2 MG tablet [id number]  Take 1 tablet (2 mg total) by mouth at bedtime. Ria Bush, MD  Active   Med List Note Dewayne Shorter, RN 05/01/22 1415): MR 08-06-2022 UDS 12/12/21             Patient Active Problem List   Diagnosis Date Noted   Cardiac pacemaker in situ 12/12/2021   DDD (degenerative disc disease), cervical 03/16/2021   Chronic low back pain (Bilateral) w/o sciatica 03/16/2021   Chronic shoulder pain (Bilateral) (L>R) 03/16/2021   History  of laparoscopic cholecystectomy (10/27/2020) 12/08/2020   Chronic diastolic heart failure ([place]) 10/22/2020   Type 2 diabetes mellitus with other specified complication (HCC) 68/61/6837   Serum lipase elevation 10/22/2020   Transaminasemia 29/09/1113   Uncomplicated opioid dependence ([place]) 09/08/2020   Trigger point with back pain (Left) 03/30/2020   Trigger point of shoulder region (scapula) (Left) 03/30/2020   Osteoarthritis of first carpometacarpal joint (thumb) of hand (Left) 03/11/2020   Chronic thumb pain (Left) 03/03/2020   Trigger middle finger of right  hand 03/03/2020   Chronic hand pain (Left) 03/03/2020   Pharmacologic therapy 12/02/2019   Disorder of skeletal system 12/02/2019   Problems influencing health status 12/02/2019   Cervicalgia 02/12/2019   Trigger finger of thumb (Right) 02/12/2019   GERD (gastroesophageal reflux disease) 09/24/2018   IDA (iron deficiency anemia) 09/24/2018   Erectile dysfunction due to arterial insufficiency 09/12/2018   Trigger finger of index finger (Right) 08/16/2018   RLS (restless legs syndrome) 02/11/2018   Immunization deficiency 02/11/2018   Positive colorectal cancer screening using Cologuard test 09/21/2017   Low testosterone in male 02/18/2017   Chronic fatigue 02/08/2017   Advanced care planning/counseling discussion 08/11/2016   Chronic cervical radicular pain 06/21/2016   Long term current use of opiate analgesic 05/15/2016   OSA (obstructive sleep apnea) 04/11/2016   Hypocitraturia 03/16/2016   Cervical foraminal stenosis (Severe C6-7) (Left) 02/02/2016   Chronic sacroiliac joint pain (Bilateral) 02/02/2016   Osteoarthritis of sacroiliac joint (Bilateral) 02/02/2016   Lumbar facet hypertrophy (L1-2, L2-3, and L4-5) (Bilateral) 02/02/2016   Lumbar IVDD (intervertebral disc displacement) 02/02/2016   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) 02/02/2016   Lumbar facet syndrome (Bilateral) 02/02/2016   Osteoarthritis of shoulder (Left) 02/02/2016   Osteoarthritis of knee (Bilateral) (R>L) 02/02/2016   Radicular pain of shoulder (Bilateral) (L>R) 09/01/2015   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) 09/01/2015    Class: History of   Cervicogenic headache 09/01/2015   Cervical spondylosis with radiculopathy (Bilateral) (L>R) 09/01/2015   Cervical facet syndrome (Bilateral) (L>R) 09/01/2015   Opiate use (60 MME/Day) 05/31/2015   Encounter for therapeutic drug level monitoring 05/31/2015   Chronic neck pain (1ry area of Pain) (Bilateral) (L>R) 05/31/2015   Chronic  knee pain (Bilateral) (R>L) 05/31/2015   History of permanent cardiac pacemaker placement 05/31/2015   Opioid-induced constipation (OIC) 05/31/2015   Encounter for general adult medical examination with abnormal findings 05/07/2015   Candidal dermatitis 05/07/2015   Hypertension 02/16/2014   Bradycardia 02/16/2014   Cardiac murmur 02/16/2014   Obesity, Class I, BMI 30.0-34.9 (see actual BMI) 04/22/2013   Benign prostatic hyperplasia with urinary obstruction 05/28/2012   Nephrolithiasis 05/28/2012   Medicare annual wellness visit, subsequent 03/27/2012   Hyperlipidemia associated with type 2 diabetes mellitus ([place]) 03/27/2012   GAD (generalized anxiety disorder) 12/25/2011   Insomnia 09/27/2011   Chronic pain syndrome 03/28/2011   MDD (major depressive disorder), recurrent episode, moderate ([place]) 03/28/2011    Immunization History  Administered Date(s) Administered   Influenza, High Dose Seasonal PF 05/04/2021   Influenza,inj,Quad PF,6+ Mos 04/22/2013, 04/28/2014, 05/07/2015, 04/11/2016, 05/22/2017, 05/16/2018   Influenza-Unspecified 06/16/2019, 04/15/2020   Moderna Covid-19 Vaccine Bivalent Booster 75yr & up 05/05/2021   Moderna Sars-Covid-2 Vaccination 11/06/2019, 12/04/2019, 07/07/2020   PNEUMOCOCCAL CONJUGATE-20 03/27/2022   Pneumococcal Polysaccharide-23 05/30/2006   Tdap 11/19/2009, 04/15/2020    Conditions to be addressed/monitored:  Hypertension, Hyperlipidemia, Heart Failure, Depression, and Allergic Rhinitis, RLS  Care Plan : CCM Pharmacy Care Plan  Updates made  by Charlton Haws, RPH since 06/05/2022 12:00 AM     Problem: Hypertension, Hyperlipidemia, Heart Failure, Depression, and Allergic Rhinitis, RLS   Priority: High     Long-Range Goal: Disease mgmt   Start Date: 07/15/2021  Expected End Date: 07/15/2022  This Visit's Progress: On track  Recent Progress: On track  Priority: High  Note:   Current Barriers:  None identified  Pharmacist Clinical  Goal(s):  Patient will contact provider office for questions/concerns as evidenced notation of same in electronic health record through collaboration with PharmD and provider.   Interventions: 1:1 collaboration with Ria Bush, MD regarding development and update of comprehensive plan of care as evidenced by provider attestation and co-signature Inter-disciplinary care team collaboration (see longitudinal plan of care) Comprehensive medication review performed; medication list updated in electronic medical record  Hyperlipidemia: (LDL goal < 100) -Controlled - baseline LDL 130, currently 64 (12/2021); pt endorses compliance with statin -Current treatment: Atorvastatin 20 mg daily HS - Appropriate, Effective, Safe, Accessible Omega-3 1000 mg - Appropriate, Effective, Safe, Accessible Aspirin 81 mg daily - Appropriate, Effective, Safe, Accessible -Educated on Cholesterol goals; Benefits of statin for ASCVD risk reduction; -Recommended to continue current medication  Heart Failure (BP Goal < 130/80, manage symptoms and prevent exacerbations) -Controlled - BP at goal per home readings -Hx heart block s/p dual chamber pacemaker -Last ejection fraction: 55-65% (Date: 10/2017) -HF type: Diastolic -Home BP readings: none  -Current treatment: Furosemide 20 mg daily - Appropriate, Effective, Safe, Accessible Amlodipine 2.5 mg daily - Appropriate, Effective, Safe, Accessible -Previously tried/failed meds: losartan (muscle pain, insomnia, nausea, anxiety) -Educated on BP goals and benefits of medications for prevention of heart attack, stroke and kidney damage; Daily salt intake goal < 2300 mg; Importance of home blood pressure monitoring; Proper BP monitoring technique -Counseled to monitor BP at home daily -Recommend to continue current medication  Diabetes (A1c goal <7%) -Controlled - A1c 6.1% (03/2022) at goal, significantly improved with dietary changes -Home BG readings: none  available -Current medications: Metformin XR 500 mg daily - Appropriate, Effective, Safe, Accessible -Medications previously tried: metformin IR  -Educated on A1c and blood sugar goals;Complications of diabetes including kidney damage, retinal damage, and cardiovascular disease; Carbohydrate counting and/or plate method -Recommended to continue current medication  Depression/Anxiety (Goal: manage symptoms) -Controlled - pt has been on fluoxetine since at least 2012; he reports is is working "well enough" currently -PHQ9: 0 (09/2020) - no/minimal depression -GAD7: 3 (02/2018) - minimal anxiety -Connected with PCP for mental health support -Current treatment: Fluoxetine 20 mg daily HS -Appropriate, Query Effective Fluoxetine 40 mg daily AM - Appropriate, Query Effective -Educated on Benefits of medication for symptom control -Concern for tachyphylaxis with fluoxetine given > 10 years on it, most SSRI efficacy wanes over time; he would be a good candidate for SNRI (duloxetine) w/ additional pain benefit if BP can be controlled -Recommended to continue current medication; consider duloxetine in future  Chronic pain (Goal: manage pain) -Stable - reasonable pain control per patient -cervical radiculopathy; lumbar disc displacement, OA of knee, shoulder -Follows with Dr Dossie Arbour (pain mgmt) -Current treatment  Oxycodone 10 mg q6h (#120/month): 2-3 times per day - Appropriate, Effective, Safe, Accessible Naproxen 500 mg BID prn - takes once a day - Appropriate, Effective, Query Safe (BP effects) Cyclobenzaprine 10 mg BID prn - Appropriate, Effective, Safe, Accessible Narcan PRN - Appropriate -Educated on risks of chronic NSAID use including elevated BP and kidney damage; discussed naproxen could be contributing to high BP  at home -Of note LFTs have normalized so it should be safe to use Tylenol PRN -Recommend to continue current medication  Renal stones (Goal: prevent recurrence) -Controlled  -  pt follows with urology; he endorses compliance with potassium citrate -Current treatment  Potassium citrate 10 mEq BID - Appropriate, Effective, Safe, Accessible -Discussed role of potassium citrate in controlling/preventing renal stones -Recommended to continue current medication  Health Maintenance -Vaccine gaps: Shingrix -Updated flu and covid vaccines in record; advised pt get Prevnar vaccine at next office visit  Patient Goals/Self-Care Activities Patient will:  - take medications as prescribed as evidenced by patient report and record review focus on medication adherence by pill box check blood pressure periodically, document, and provide at future appointments      Medication Assistance: None required.  Patient affirms current coverage meets needs.  Compliance/Adherence/Medication fill history: Care Gaps: Cologuard (due 09/21/20) - planning to set up colonoscopy in 2023  Star-Rating Drugs: Atorvastatin 20 mg - PDC 100% Metformin - PDC 100%  Medication Access: Within the past 30 days, how often has patient missed a dose of medication? 0 Is a pillbox or other method used to improve adherence? Yes  Factors that may affect medication adherence? no barriers identified Are meds synced by current pharmacy? No  Are meds delivered by current pharmacy? Yes  Does patient experience delays in picking up medications due to transportation concerns? No   [facility] Reviewed: Is patient disadvantaged to use [facility]?: Yes  Current Rx insurance plan: [place] Name and location of Current pharmacy:  [place], [place] [address] [place] [address] Phone: [phone] Fax: [phone]  [facility] services reviewed with patient today?: No  Patient requests to transfer care to [facility]?: No  Reason patient declined to change pharmacies: Disadvantaged due to insurance/mail order   Care Plan and Follow Up Patient  Decision:  Patient agrees to Care Plan and Follow-up.  Plan: The patient has been provided with contact information for the care management team and has been advised to call with any health related questions or concerns.   Charlene Brooke, PharmD, BCACP Clinical Pharmacist [facility] at [facility] [phone]

## 2022-06-05 NOTE — Patient Instructions (Signed)
Visit Information  Phone number for Pharmacist: [phone]   Goals Addressed   None     Care Plan : [place]  Updates made by Charlton Haws, [facility] since 06/05/2022 12:00 AM     Problem: Hypertension, Hyperlipidemia, Heart Failure, Depression, and Allergic Rhinitis, RLS   Priority: High     Long-Range Goal: Disease mgmt   Start Date: 07/15/2021  Expected End Date: 07/15/2022  This Visit's Progress: On track  Recent Progress: On track  Priority: High  Note:   Current Barriers:  None identified  Pharmacist Clinical Goal(s):  Patient will contact provider office for questions/concerns as evidenced notation of same in electronic health record through collaboration with PharmD and provider.   Interventions: 1:1 collaboration with Ria Bush, MD regarding development and update of comprehensive plan of care as evidenced by provider attestation and co-signature Inter-disciplinary care team collaboration (see longitudinal plan of care) Comprehensive medication review performed; medication list updated in electronic medical record  Hyperlipidemia: (LDL goal < 100) -Controlled - baseline LDL 130, currently 64 (12/2021); pt endorses compliance with statin -Current treatment: Atorvastatin 20 mg daily HS - Appropriate, Effective, Safe, Accessible Omega-3 1000 mg - Appropriate, Effective, Safe, Accessible Aspirin 81 mg daily - Appropriate, Effective, Safe, Accessible -Educated on Cholesterol goals; Benefits of statin for ASCVD risk reduction; -Recommended to continue current medication  Heart Failure (BP Goal < 130/80, manage symptoms and prevent exacerbations) -Controlled - BP at goal per home readings -Hx heart block s/p dual chamber pacemaker -Last ejection fraction: 55-65% (Date: 10/2017) -HF type: Diastolic -Home BP readings: none  -Current treatment: Furosemide 20 mg daily - Appropriate, Effective, Safe, Accessible Amlodipine 2.5 mg daily -  Appropriate, Effective, Safe, Accessible -Previously tried/failed meds: losartan (muscle pain, insomnia, nausea, anxiety) -Educated on BP goals and benefits of medications for prevention of heart attack, stroke and kidney damage; Daily salt intake goal < 2300 mg; Importance of home blood pressure monitoring; Proper BP monitoring technique -Counseled to monitor BP at home daily -Recommend to continue current medication  Diabetes (A1c goal <7%) -Controlled - A1c 6.1% (03/2022) at goal, significantly improved with dietary changes -Home BG readings: none available -Current medications: Metformin XR 500 mg daily - Appropriate, Effective, Safe, Accessible -Medications previously tried: metformin IR  -Educated on A1c and blood sugar goals;Complications of diabetes including kidney damage, retinal damage, and cardiovascular disease; Carbohydrate counting and/or plate method -Recommended to continue current medication  Depression/Anxiety (Goal: manage symptoms) -Controlled - pt has been on fluoxetine since at least 2012; he reports is is working "well enough" currently -PHQ9: 0 (09/2020) - no/minimal depression -GAD7: 3 (02/2018) - minimal anxiety -Connected with PCP for mental health support -Current treatment: Fluoxetine 20 mg daily HS -Appropriate, Query Effective Fluoxetine 40 mg daily AM - Appropriate, Query Effective -Educated on Benefits of medication for symptom control -Concern for tachyphylaxis with fluoxetine given > 10 years on it, most SSRI efficacy wanes over time; he would be a good candidate for SNRI (duloxetine) w/ additional pain benefit if BP can be controlled -Recommended to continue current medication; consider duloxetine in future  Chronic pain (Goal: manage pain) -Stable - reasonable pain control per patient -cervical radiculopathy; lumbar disc displacement, OA of knee, shoulder -Follows with Dr Dossie Arbour (pain mgmt) -Current treatment  Oxycodone 10 mg q6h (#120/month): 2-3  times per day - Appropriate, Effective, Safe, Accessible Naproxen 500 mg BID prn - takes once a day - Appropriate, Effective, Query Safe (BP effects) Cyclobenzaprine 10 mg BID prn -  Appropriate, Effective, Safe, Accessible Narcan PRN - Appropriate -Educated on risks of chronic NSAID use including elevated BP and kidney damage; discussed naproxen could be contributing to high BP at home -Of note LFTs have normalized so it should be safe to use Tylenol PRN -Recommend to continue current medication  Renal stones (Goal: prevent recurrence) -Controlled  - pt follows with urology; he endorses compliance with potassium citrate -Current treatment  Potassium citrate 10 mEq BID - Appropriate, Effective, Safe, Accessible -Discussed role of potassium citrate in controlling/preventing renal stones -Recommended to continue current medication  Health Maintenance -Vaccine gaps: Shingrix -Updated flu and covid vaccines in record; advised pt get Prevnar vaccine at next office visit  Patient Goals/Self-Care Activities Patient will:  - take medications as prescribed as evidenced by patient report and record review focus on medication adherence by pill box check blood pressure periodically, document, and provide at future appointments      Patient verbalizes understanding of instructions and care plan provided today and agrees to view in MyChart. Active MyChart status and patient understanding of how to access instructions and care plan via MyChart confirmed with patient.    Telephone follow up appointment with pharmacy team member scheduled for: PRN  [name], PharmD, BCACP Clinical Pharmacist [facility] [phone]

## 2022-06-09 ENCOUNTER — Other Ambulatory Visit: Payer: Self-pay | Admitting: Pain Medicine

## 2022-06-09 DIAGNOSIS — M542 Cervicalgia: Secondary | ICD-10-CM

## 2022-06-09 DIAGNOSIS — Z79891 Long term (current) use of opiate analgesic: Secondary | ICD-10-CM

## 2022-06-09 DIAGNOSIS — G894 Chronic pain syndrome: Secondary | ICD-10-CM

## 2022-06-09 DIAGNOSIS — M961 Postlaminectomy syndrome, not elsewhere classified: Secondary | ICD-10-CM

## 2022-06-09 DIAGNOSIS — M47816 Spondylosis without myelopathy or radiculopathy, lumbar region: Secondary | ICD-10-CM

## 2022-06-09 DIAGNOSIS — G4486 Cervicogenic headache: Secondary | ICD-10-CM

## 2022-06-09 DIAGNOSIS — Z79899 Other long term (current) drug therapy: Secondary | ICD-10-CM

## 2022-06-09 DIAGNOSIS — G8929 Other chronic pain: Secondary | ICD-10-CM

## 2022-07-05 ENCOUNTER — Telehealth: Payer: Self-pay | Admitting: Family Medicine

## 2022-07-05 DIAGNOSIS — G894 Chronic pain syndrome: Secondary | ICD-10-CM

## 2022-07-07 NOTE — Telephone Encounter (Signed)
Naproxen Last filled:  08/14/20, #90 Last OV:  03/27/22, 3 mo DM f/u Next OV:  08/28/22, 5 mo DM f/u

## 2022-07-11 ENCOUNTER — Other Ambulatory Visit: Payer: Self-pay | Admitting: *Deleted

## 2022-07-11 DIAGNOSIS — N200 Calculus of kidney: Secondary | ICD-10-CM

## 2022-07-11 NOTE — Telephone Encounter (Signed)
Pt called office to let Dr Lonna Cobb know he's been passing small stones.  His next appt is on 09/13/22.  He is in pain, not terrible, but wants to know if he should be seen sooner.

## 2022-07-11 NOTE — Telephone Encounter (Signed)
He can see a pa with kub prior

## 2022-07-18 NOTE — Progress Notes (Signed)
07/19/2022 2:20 PM   Mark [name] 09/23/1955 [id number]  Referring provider: Ria Bush, [address],  [place] [address]  Urological history: 1. Nephrolithiasis -stone composition uric acid 60%/calcium oxalate 40% -24 hour urine study hypocitraturia, mild hypercalciuria and low urine pH -ESWL's/URS  -KUB (09/2021) bilateral nephrolithiasis  2. Hydrocele   3. Prostate cancer screening -PSA (12/2021) 0.59  4. Erectile dysfunction -contributing factors of age, HTN, HF, sleep apnea, diabetes, chronic opioid use, lumbar DDD, BPH, depression, anxiety and testosterone deficiency  5. Renal cysts -contrast CT (10/2020) - Multiple bilateral renal cysts   Chief Complaint  Patient presents with   Nephrolithiasis    HPI: Mark [name] is a 66 y.o. male who presents today for for possible passage of stones.  He is having right sided flank pain.  He has been passing tiny fragments over the last three weeks.  He states the pain is worse in the morning but it eases off as the day progresses.  He has not had any pain while passing his tiny fragments.  Patient denies any modifying or aggravating factors.  Patient denies any gross hematuria, dysuria or suprapubic/flank pain.  Patient denies any fevers, chills, nausea or vomiting.    UA yellow clear, specific gravity 1.010, pH 5.0, 0-5 WBCs, 0-2 RBCs, 0-10 epithelial cells and a few bacteria.  KUB (07/19/2022) bilateral lower pole stones.   PMH: Past Medical History:  Diagnosis Date   Anxiety    Arthritis    Chronic pain    Depression    Heart murmur    History of kidney stones April 12,2017   removed two kidney stones about 1 cm in size   History of permanent cardiac pacemaker placement 05/31/2015   Hydrocele in adult 05/07/2015   Kidney stones    Migraine    MRSA carrier 2019   Presence of permanent cardiac pacemaker    Prostatitis    Sleep apnea    Does not wear CPAP    Surgical  History: Past Surgical History:  Procedure Laterality Date   CARDIOVASCULAR STRESS TEST  2014   Dr Heide Spark RELEASE Bilateral 2006   [place]  2008   C5/6   CHOLECYSTECTOMY N/A 10/27/2020   Procedure: LAPAROSCOPIC CHOLECYSTECTOMY;  Surgeon: Georganna Skeans, MD;  Location: [place];  Service: General;  Laterality: N/A;   COLONOSCOPY  07/2007   prostate nodule, hemorrhoid, rpt 5 yrs [name])   ERCP N/A 10/26/2020   Procedure: ENDOSCOPIC RETROGRADE CHOLANGIOPANCREATOGRAPHY (ERCP);  Surgeon: Carol Ada, MD;  Location: [place];  Service: Endoscopy;  Laterality: N/A;  choledocholithiasis   ESOPHAGOGASTRODUODENOSCOPY (EGD) WITH PROPOFOL N/A 10/26/2020   Procedure: ESOPHAGOGASTRODUODENOSCOPY (EGD) WITH PROPOFOL;  Surgeon: Carol Ada, MD;  Location: [place];  Service: Endoscopy;  Laterality: N/A;   EUS  10/26/2020   Procedure: UPPER ENDOSCOPIC ULTRASOUND (EUS) LINEAR;  Surgeon: Carol Ada, MD;  Location: [facility] ENDOSCOPY;  Service: Endoscopy;;   IMPLANTABLE CARDIOVERTER DEFIBRILLATOR (ICD) GENERATOR CHANGE Left 12/13/2017   Procedure: PACER CHANGE OUT;  Surgeon: Isaias Cowman, MD;  Location: [facility]S;  Service: Cardiovascular;  Laterality: Left;   LAPAROSCOPIC CHOLECYSTECTOMY  10/2020   LITHOTRIPSY Bilateral 2012   Stoioff   LITHOTRIPSY Left 11/2015   x2 Stoioff   pace maker revision  12/13/2017   PACEMAKER PLACEMENT  2009   s/p Medtronic Adapta DR Pacemaker (Dr. Ubaldo Glassing)   REMOVAL OF STONES  10/26/2020   Procedure: REMOVAL OF STONES;  Surgeon: Benson Norway,  Saralyn Pilar, MD;  Location: [facility] ENDOSCOPY;  Service: Endoscopy;;   ROTATOR CUFF REPAIR Left 2009   SPHINCTEROTOMY  10/26/2020   Procedure: SPHINCTEROTOMY;  Surgeon: Carol Ada, MD;  Location: [facility] ENDOSCOPY;  Service: Endoscopy;;   [place]  2008   Left    Home Medications:  Allergies as of 07/19/2022       Reactions   Ciprofloxacin Other (See Comments)   PANIC ATTACKS  (Fluoroquinolones)   Losartan Other (See Comments)   Muscle pain, difficulty sleeping, anxiety, nausea   Metformin Other (See Comments)   Bad acid reflux   Buprenorphine Hcl Nausea Only   Morphine And Related Nausea Only   Penicillins Rash   Rash Has patient had a PCN reaction causing immediate rash, facial/tongue/throat swelling, SOB or lightheadedness with hypotension: Unknown Has patient had a PCN reaction causing severe rash involving mucus membranes or skin necrosis: Unknown Has patient had a PCN reaction that required hospitalization: No Has patient had a PCN reaction occurring within the last 10 years: No CHILDHOOD REACTION. If all of the above answers are "NO", then may proceed with Cephalosporin use.        Medication List        Accurate as of July 19, 2022  2:20 PM. If you have any questions, ask your nurse or doctor.          amLODipine 2.5 MG tablet Commonly known as: NORVASC Take 1 tablet (2.5 mg total) by mouth daily.   aspirin 81 MG tablet Take 81 mg by mouth daily.   atorvastatin 20 MG tablet Commonly known as: LIPITOR Take 1 tablet (20 mg total) by mouth at bedtime.   CertaVite Senior/Antioxidant Tabs Take 1 tablet by mouth daily.   cetirizine 10 MG tablet Commonly known as: ZYRTEC Take 10 mg by mouth at bedtime.   clindamycin 150 MG capsule Commonly known as: CLEOCIN Take 150 mg by mouth as needed. Prior to dental appt   cyclobenzaprine 10 MG tablet Commonly known as: FLEXERIL TAKE ONE-HALF TO ONE TABLET BY MOUTH 2 TIMES DAILY AS NEEDED FOR MUSCLE SPASMS   ferrous sulfate 325 (65 FE) MG EC tablet Take 325 mg by mouth daily with breakfast.   Fish Oil 1000 MG Caps Take by mouth daily.   FLUoxetine 40 MG capsule Commonly known as: PROZAC Take 1 capsule (40 mg total) by mouth daily.   FLUoxetine 20 MG capsule Commonly known as: PROZAC Take 1 capsule (20 mg total) by mouth daily.   fluticasone 50 MCG/ACT nasal spray Commonly  known as: FLONASE Place 2 sprays into both nostrils daily.   furosemide 20 MG tablet Commonly known as: LASIX Take 1 tablet (20 mg total) by mouth daily.   Garlic 979 MG Tabs Take by mouth daily.   metFORMIN 500 MG 24 hr tablet Commonly known as: GLUCOPHAGE-XR Take 1 tablet (500 mg total) by mouth daily with breakfast.   naloxone 4 MG/0.1ML Liqd nasal spray kit Commonly known as: NARCAN Place 1 spray into the nose as needed for up to 365 doses (for opioid-induced respiratory depresssion). In case of emergency (overdose), spray once into each nostril. If no response within 3 minutes, repeat application and call [phone].   naproxen 500 MG tablet Commonly known as: NAPROSYN TAKE 1 TABLET pop TWICE DAILY AS NEEDED FOR MODERATE PAIN   omeprazole 20 MG capsule Commonly known as: PRILOSEC Take 1 capsule (20 mg total) by mouth daily.   Oxycodone HCl 10 MG  Tabs Take 1 tablet (10 mg total) by mouth every 8 (eight) hours as needed. Each refill must last 30 days.   Oxycodone HCl 10 MG Tabs Take 1 tablet (10 mg total) by mouth every 8 (eight) hours as needed. Each refill must last 30 days.   Oxycodone HCl 10 MG Tabs Take 1 tablet (10 mg total) by mouth every 8 (eight) hours as needed. Each refill must last 30 days.   potassium citrate 10 MEQ (1080 MG) SR tablet Commonly known as: UROCIT-K Take 1 tablet (10 mEq total) by mouth 2 (two) times daily.   rOPINIRole 2 MG tablet Commonly known as: REQUIP Take 1 tablet (2 mg total) by mouth at bedtime.   SUPER B COMPLEX/VITAMIN C PO Take by mouth daily.   VITAMIN B12 PO Take by mouth daily.   vitamin C 1000 MG tablet Take 1,000 mg by mouth daily.   VITAMIN D3 PO Take 2,000 Int'l Units by mouth daily.        Allergies:  Allergies  Allergen Reactions   Ciprofloxacin Other (See Comments)    PANIC ATTACKS (Fluoroquinolones)    Losartan Other (See Comments)    Muscle pain, difficulty sleeping, anxiety, nausea   Metformin Other  (See Comments)    Bad acid reflux   Buprenorphine Hcl Nausea Only   Morphine And Related Nausea Only   Penicillins Rash    Rash Has patient had a PCN reaction causing immediate rash, facial/tongue/throat swelling, SOB or lightheadedness with hypotension: Unknown Has patient had a PCN reaction causing severe rash involving mucus membranes or skin necrosis: Unknown Has patient had a PCN reaction that required hospitalization: No Has patient had a PCN reaction occurring within the last 10 years: No CHILDHOOD REACTION. If all of the above answers are "NO", then may proceed with Cephalosporin use.     Family History: Family History  Problem Relation Age of Onset   Heart disease Mother    Diabetes Mother    Arthritis Mother    Cancer Mother        Breast Cancer   Heart block Father    Heart disease Father    Arthritis Father    Cancer Father        skin cancer    Social History:  reports that he has never smoked. He has never used smokeless tobacco. He reports that he does not drink alcohol and does not use drugs.  ROS: Pertinent ROS in HPI  Physical Exam: BP (!) 146/81   Pulse 77   Ht _0  (1.702 m)   Wt 220 lb (99.8 kg)   BMI 34.46 kg/m   Constitutional:  Well nourished. Alert and oriented, No acute distress. HEENT: [place] AT, moist mucus membranes.  Trachea midline Cardiovascular: No clubbing, cyanosis, or edema. Respiratory: Normal respiratory effort, no increased work of breathing. Neurologic: Grossly intact, no focal deficits, moving all 4 extremities. Psychiatric: Normal mood and affect.  Laboratory Data: Lab Results  Component Value Date   WBC 6.3 12/12/2021   HGB 14.5 12/12/2021   HCT 41.4 12/12/2021   MCV 88.7 12/12/2021   PLT 177.0 12/12/2021    Lab Results  Component Value Date   CREATININE 0.90 12/12/2021    Lab Results  Component Value Date   PSA 0.59 12/12/2021   PSA 0.78 09/07/2020   PSA 1.38 08/13/2019     Lab Results  Component Value  Date   HGBA1C 6.1 (A) 03/27/2022    Lab Results  Component Value  Date   TSH 4.26 12/12/2021       Component Value Date/Time   CHOL 129 12/12/2021 0907   HDL 41.80 12/12/2021 0907   CHOLHDL 3 12/12/2021 0907   VLDL 44.4 (H) 12/12/2021 0907   LDLCALC 46 09/07/2020 1316    Lab Results  Component Value Date   AST 24 10/05/2021   Lab Results  Component Value Date   ALT 28 10/05/2021   Urinalysis See HPI and EPIC I have reviewed the labs.   Pertinent Imaging: Narrative & Impression  CLINICAL DATA:  Follow-up kidney stones   EXAM: ABDOMEN - 1 VIEW   COMPARISON:  09/12/2021   FINDINGS: Scattered large and small bowel gas is noted. Scattered lower pole renal calculi are noted bilaterally relatively stable from the prior exam. No ureteral stones are seen. Phleboliths are noted in the pelvis. No acute bony abnormality is seen.   IMPRESSION: Bilateral renal calculi stable from the previous exam.     Electronically Signed   By: Inez Catalina M.D.   On: 07/20/2022 19:56   I have independently reviewed the films.    Assessment & Plan:    1. Nephrolithiasis -Patient is passing continual small fragments for the last 3 weeks with right-sided flank pain -We discussed pursuing another renal ultrasound or CT scan for further evaluation, but he would like to hold on this at this time citing cost -He will continue MET -UA is benign but we will send for culture to rule out any subclinical infection or if he decides to undergo a procedure in the near future -reviewed return precautions  Return for return with Dr. Bernardo Heater in February .  These notes generated with voice recognition software. I apologize for typographical errors.  [place], [place] [address]  [place], [place] [address] [phone]

## 2022-07-19 ENCOUNTER — Ambulatory Visit
Admission: RE | Admit: 2022-07-19 | Discharge: 2022-07-19 | Disposition: A | Discharge status: Home / Self Care | Payer: Medicare Other | Attending: Urology | Admitting: Urology

## 2022-07-19 ENCOUNTER — Ambulatory Visit
Admission: RE | Admit: 2022-07-19 | Discharge: 2022-07-19 | Disposition: A | Discharge status: Home / Self Care | Payer: Medicare Other | Source: Ambulatory Visit | Attending: Urology | Admitting: Urology

## 2022-07-19 ENCOUNTER — Ambulatory Visit: Payer: Medicare Other | Admitting: Urology

## 2022-07-19 ENCOUNTER — Encounter: Payer: Self-pay | Admitting: Urology

## 2022-07-19 VITALS — BP 146/81 | HR 77 | Ht 67.0 in | Wt 220.0 lb

## 2022-07-19 DIAGNOSIS — N200 Calculus of kidney: Secondary | ICD-10-CM

## 2022-07-19 DIAGNOSIS — I878 Other specified disorders of veins: Secondary | ICD-10-CM | POA: Diagnosis not present

## 2022-07-19 LAB — URINALYSIS, COMPLETE
Bilirubin, UA: NEGATIVE
Glucose, UA: NEGATIVE
Ketones, UA: NEGATIVE
Leukocytes,UA: NEGATIVE
Nitrite, UA: NEGATIVE
Protein,UA: NEGATIVE
RBC, UA: NEGATIVE
Specific Gravity, UA: 1.01 (ref 1.005–1.030)
Urobilinogen, Ur: 0.2 mg/dL (ref 0.2–1.0)
pH, UA: 5 (ref 5.0–7.5)

## 2022-07-19 LAB — MICROSCOPIC EXAMINATION

## 2022-07-22 LAB — CULTURE, URINE COMPREHENSIVE

## 2022-07-23 NOTE — Progress Notes (Unsigned)
PROVIDER NOTE: Information contained herein reflects review and annotations entered in association with encounter. Interpretation of such information and data should be left to medically-trained personnel. Information provided to patient can be located elsewhere in the medical record under "Patient Instructions". Document created using STT-dictation technology, any transcriptional errors that may result from process are unintentional.    Patient: Mark [name]  Service Category: E/M  Provider: Gaspar Cola, MD  DOB: [birth year]  DOS: 07/24/2022  Referring Provider: Ria Bush, MD  MRN: [id number]  Specialty: Interventional Pain Management  PCP: Ria Bush, MD  Type: Established Patient  Setting: Ambulatory outpatient    Location: Office  Delivery: Face-to-face     HPI  [name], a 66 y.o. year old male, is here today because of his No primary diagnosis found.. Mark [name] primary complain today is No chief complaint on file. Last encounter: My last encounter with him was on 06/09/2022. 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) (Left); Chronic sacroiliac joint pain (Bilateral); Osteoarthritis of sacroiliac joint (Bilateral); Lumbar facet hypertrophy (L1-2, L2-3, and L4-5) (Bilateral); Lumbar IVDD (intervertebral disc displacement); 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); Lumbar facet syndrome (Bilateral); Osteoarthritis of shoulder (Left); Osteoarthritis of knee (Bilateral) (R>L); Chronic cervical radicular pain; Chronic fatigue; RLS (restless legs syndrome); Trigger finger of index  finger (Right); Cervicalgia; Trigger finger of thumb (Right); Chronic thumb pain (Left); Trigger middle finger of right hand; Chronic hand pain (Left); Osteoarthritis of first carpometacarpal joint (thumb) of hand (Left); Trigger point with back pain (Left); Trigger point of shoulder region (scapula) (Left); History of laparoscopic cholecystectomy (10/27/2020); DDD (degenerative disc disease), cervical; Chronic low back pain (Bilateral) w/o sciatica; and Chronic shoulder pain (Bilateral) (L>R) on their pertinent problem list. Pain Assessment: Severity of   is reported as a  /10. Location:    / . Onset:  . Quality:  . Timing:  . Modifying factor(s):  Marland Kitchen Vitals:  vitals were not taken for this visit.  BMI: Estimated body mass index is 34.46 kg/m as calculated from the following:   Height as of 07/19/22: _0  (1.702 m).   Weight as of 07/19/22: 220 lb (99.8 kg).  Reason for encounter:  *** . ***  Pharmacotherapy Assessment  Analgesic: Oxycodone IR 10 mg, 1 tab PO BID to TID (20-30 mg/day total of oxycodone) MME/day: 60 mg/day.   Monitoring: [place] PMP: PDMP reviewed during this encounter.       Pharmacotherapy: No side-effects or adverse reactions reported. Compliance: No problems identified. Effectiveness: Clinically acceptable.  No notes on file  No results found for: "CBDTHCR" No results found for: "D8THCCBX" No results found for: "D9THCCBX"  UDS:  Summary  Date Value Ref Range Status  12/12/2021 Note  Final    Comment:    ==================================================================== [employer] [facility] 13 (MW) ==================================================================== Test                             Result       Flag       Units  Drug Present and Declared for Prescription Verification   Oxycodone  4111         EXPECTED   ng/mg creat   Noroxycodone                   6306         EXPECTED   ng/mg creat    Sources of oxycodone include scheduled  prescription medications.    Noroxycodone is an expected metabolite of oxycodone.  ==================================================================== Test                      Result    Flag   Units      Ref Range   Creatinine              54               mg/dL      >=20 ==================================================================== Declared Medications:  The flagging and interpretation on this report are based on the  following declared medications.  Unexpected results may arise from  inaccuracies in the declared medications.   **Note: The testing scope of this panel includes these medications:   Oxycodone   **Note: The testing scope of this panel does not include the  following reported medications:   Amlodipine (Norvasc)  Aspirin  Atorvastatin (Lipitor)  Cetirizine (Zyrtec)  Clindamycin (Cleocin)  Cranberry  Cyclobenzaprine (Flexeril)  Fluoxetine (Prozac)  Fluticasone (Flonase)  Furosemide (Lasix)  Iron  Multivitamin  Naloxone (Narcan)  Naproxen (Naprosyn)  Omeprazole (Prilosec)  Potassium  Ropinirole (Requip)  Supplement  Vitamin C  Vitamin D ==================================================================== For clinical consultation, please call [phone]. ====================================================================       ROS  Constitutional: Denies any fever or chills Gastrointestinal: No reported hemesis, hematochezia, vomiting, or acute GI distress Musculoskeletal: Denies any acute onset joint swelling, redness, loss of ROM, or weakness Neurological: No reported episodes of acute onset apraxia, aphasia, dysarthria, agnosia, amnesia, paralysis, loss of coordination, or loss of consciousness  Medication Review  B Complex-C, CertaVite Senior/Antioxidant, Cholecalciferol, Cyanocobalamin, FLUoxetine, Fish Oil, Garlic, Oxycodone HCl, amLODipine, aspirin, atorvastatin, cetirizine, clindamycin, cyclobenzaprine, ferrous sulfate, fluticasone,  furosemide, metFORMIN, naloxone, naproxen, omeprazole, potassium citrate, rOPINIRole, and vitamin C  History Review  Allergy: Mark [name] is allergic to ciprofloxacin, losartan, metformin, buprenorphine hcl, morphine and related, and penicillins. Drug: Mark [name]  reports no history of drug use. Alcohol:  reports no history of alcohol use. Tobacco:  reports that he has never smoked. He has never used smokeless tobacco. Social: Mark [name]  reports that he has never smoked. He has never used smokeless tobacco. He reports that he does not drink alcohol and does not use drugs. Medical:  has a past medical history of Anxiety, Arthritis, Chronic pain, Depression, Heart murmur, History of kidney stones (April 12,2017), History of permanent cardiac pacemaker placement (05/31/2015), Hydrocele in adult (05/07/2015), Kidney stones, Migraine, MRSA carrier (2019), Presence of permanent cardiac pacemaker, Prostatitis, and Sleep apnea. Surgical: Mark [name]  has a past surgical history that includes Tonsillectomy (1964); Carpal tunnel release (Bilateral, 2006); Ulnar nerve repair (2008); Cervical spine surgery (2008); Rotator cuff repair (Left, 2009); pacemaker placement (2009); Lithotripsy (Bilateral, 2012); Lithotripsy (Left, 11/2015); Cardiovascular stress test (2014); Colonoscopy (07/2007); implantable cardioverter defibrillator (icd) generator change (Left, 12/13/2017); pace maker revision (12/13/2017); Laparoscopic cholecystectomy (10/2020); Cholecystectomy (N/A, 10/27/2020); ERCP (N/A, 10/26/2020); Esophagogastroduodenoscopy (egd) with propofol (N/A, 10/26/2020); EUS (10/26/2020); Sphincterotomy (10/26/2020); and removal of stones (10/26/2020). Family: family history includes Arthritis in his father and mother; Cancer in his father and mother; Diabetes in his mother; Heart block  in his father; Heart disease in his father and mother.  Laboratory Chemistry Profile   Renal Lab Results  Component Value Date   BUN 20  12/12/2021   CREATININE 0.90 12/12/2021   GFR 89.99 12/12/2021   GFRAA >60 12/06/2017   GFRNONAA >60 10/29/2020    Hepatic Lab Results  Component Value Date   AST 24 10/05/2021   ALT 28 10/05/2021   ALBUMIN 4.1 10/05/2021   ALKPHOS 67 10/05/2021   HCVAB NEGATIVE 07/22/2013   LIPASE 39 10/28/2020    Electrolytes Lab Results  Component Value Date   NA 138 12/12/2021   K 3.8 12/12/2021   CL 100 12/12/2021   CALCIUM 9.1 12/12/2021   MG 2.1 09/01/2015    Bone Lab Results  Component Value Date   25OHVITD1 63 02/02/2016   25OHVITD2 <1.0 02/02/2016   25OHVITD3 63 02/02/2016   TESTOSTERONE 495 08/08/2017    Inflammation (CRP: Acute Phase) (ESR: Chronic Phase) Lab Results  Component Value Date   CRP 1.7 (H) 09/01/2015   ESRSEDRATE 6 09/01/2015         Note: Above Lab results reviewed.  Recent Imaging Review  Abdomen 1 view (KUB) CLINICAL DATA:  Follow-up kidney stones  EXAM: ABDOMEN - 1 VIEW  COMPARISON:  09/12/2021  FINDINGS: Scattered large and small bowel gas is noted. Scattered lower pole renal calculi are noted bilaterally relatively stable from the prior exam. No ureteral stones are seen. Phleboliths are noted in the pelvis. No acute bony abnormality is seen.  IMPRESSION: Bilateral renal calculi stable from the previous exam.  Electronically Signed   By: Inez Catalina M.D.   On: 07/20/2022 19:56 Note: Reviewed        Physical Exam  General appearance: Well nourished, well developed, and well hydrated. In no apparent acute distress Mental status: Alert, oriented x 3 (person, place, & time)       Respiratory: No evidence of acute respiratory distress Eyes: PERLA Vitals: There were no vitals taken for this visit. BMI: Estimated body mass index is 34.46 kg/m as calculated from the following:   Height as of 07/19/22: _0  (1.702 m).   Weight as of 07/19/22: 220 lb (99.8 kg). Ideal: Ideal body weight: 66.1 kg (145 lb 11.6 oz) Adjusted ideal body  weight: 79.6 kg (175 lb 6.9 oz)  Assessment   Diagnosis Status  No diagnosis found. Controlled Controlled Controlled   Updated Problems: No problems updated.  Plan of Care  Problem-specific:  No problem-specific Assessment & Plan notes found for this encounter.  Mark [name] has a current medication list which includes the following long-term medication(s): amlodipine, atorvastatin, cetirizine, fluoxetine, fluoxetine, fluticasone, furosemide, metformin, omeprazole, oxycodone hcl, oxycodone hcl, oxycodone hcl, and ropinirole.  Pharmacotherapy (Medications Ordered): No orders of the defined types were placed in this encounter.  Orders:  No orders of the defined types were placed in this encounter.  Follow-up plan:   No follow-ups on file.     Interventional Therapies  Risk  Complexity Considerations:   Estimated body mass index is 35.24 kg/m as calculated from the following:   Height as of this encounter: _1  (1.702 m).   Weight as of this encounter: 225 lb (102.1 kg). WNL   Planned  Pending:      Under consideration:   Diagnostic right hand digit 2 trigger finger injection Therapeutic bilateral IA Hyalgan knee injections  Diagnostic bilateral cervical facet block  Possible bilateral cervical facet RFA  Diagnostic left sided CESI  Diagnostic bilateral IA knee injection  Diagnostic bilateral genicular NB  Possible bilateral genicular nerve RFA  Diagnostic bilateral sacroiliac joint block  Possible bilateral sacroiliac joint RFA  Diagnostic bilateral IA shoulder joint injection  Diagnostic bilateral suprascapular NB  Possible bilateral suprascapular nerve RFA  Diagnostic bilateral lumbar facet block  Possible bilateral lumbar facet RFA  Diagnostic left L4-5 LESI    Completed:   Therapeutic left levator scapula, supraspinatus, and rhomboid muscle TPI/MNB x1 (03/30/2020) (100/100/85/75)  Diagnostic left C7-T1 cervical ESI x1 (06/21/2016) (90/90/70/75)   Diagnostic bilateral IA Hyalgan knee inj. x3 (06/21/2016) (left: 100/90/100/90/75) (right: 90/90/80/75)  Diagnostic/therapeutic right thumb inj. x1 (05/23/2016) (n/a) Diagnostic/therapeutic left thumb (CMC-Trapezium) inj. x1 (03/11/2020) (100/100/75/75)    Therapeutic  Palliative (PRN) options:   Therapeutic IA Hyalgan knee injections       Recent Visits Date Type Provider Dept  05/01/22 Office Visit Milinda Pointer, MD [facility]  Showing recent visits within past 90 days and meeting all other requirements Future Appointments Date Type Provider Dept  07/24/22 Appointment Milinda Pointer, MD [facility]  Showing future appointments within next 90 days and meeting all other requirements  I discussed the assessment and treatment plan with the patient. The patient was provided an opportunity to ask questions and all were answered. The patient agreed with the plan and demonstrated an understanding of the instructions.  Patient advised to call back or seek an in-person evaluation if the symptoms or condition worsens.  Duration of encounter: *** minutes.  Total time on encounter, as per AMA guidelines included both the face-to-face and non-face-to-face time personally spent by the physician and/or other qualified health care professional(s) on the day of the encounter (includes time in activities that require the physician or other qualified health care professional and does not include time in activities normally performed by clinical staff). Physician's time may include the following activities when performed: preparing to see the patient (eg, review of tests, pre-charting review of records) obtaining and/or reviewing separately obtained history performing a medically appropriate examination and/or evaluation counseling and educating the patient/family/caregiver ordering medications, tests, or procedures referring and communicating with other health care  professionals (when not separately reported) documenting clinical information in the electronic or other health record independently interpreting results (not separately reported) and communicating results to the patient/ family/caregiver care coordination (not separately reported)  Note by: Gaspar Cola, MD Date: 07/24/2022; Time: 10:16 AM

## 2022-07-24 ENCOUNTER — Encounter: Payer: Self-pay | Admitting: *Deleted

## 2022-07-24 ENCOUNTER — Ambulatory Visit: Payer: Medicare Other | Attending: Pain Medicine | Admitting: Pain Medicine

## 2022-07-24 ENCOUNTER — Encounter: Payer: Self-pay | Admitting: Pain Medicine

## 2022-07-24 VITALS — BP 138/71 | HR 83 | Temp 97.1°F | Resp 18 | Ht 67.0 in | Wt 220.0 lb

## 2022-07-24 DIAGNOSIS — M25511 Pain in right shoulder: Secondary | ICD-10-CM | POA: Diagnosis not present

## 2022-07-24 DIAGNOSIS — M545 Low back pain, unspecified: Secondary | ICD-10-CM | POA: Diagnosis not present

## 2022-07-24 DIAGNOSIS — M542 Cervicalgia: Secondary | ICD-10-CM | POA: Diagnosis not present

## 2022-07-24 DIAGNOSIS — M961 Postlaminectomy syndrome, not elsewhere classified: Secondary | ICD-10-CM | POA: Diagnosis not present

## 2022-07-24 DIAGNOSIS — G894 Chronic pain syndrome: Secondary | ICD-10-CM

## 2022-07-24 DIAGNOSIS — Z79891 Long term (current) use of opiate analgesic: Secondary | ICD-10-CM | POA: Diagnosis not present

## 2022-07-24 DIAGNOSIS — G4486 Cervicogenic headache: Secondary | ICD-10-CM

## 2022-07-24 DIAGNOSIS — M25512 Pain in left shoulder: Secondary | ICD-10-CM

## 2022-07-24 DIAGNOSIS — M5412 Radiculopathy, cervical region: Secondary | ICD-10-CM | POA: Diagnosis not present

## 2022-07-24 DIAGNOSIS — G8929 Other chronic pain: Secondary | ICD-10-CM

## 2022-07-24 DIAGNOSIS — M47816 Spondylosis without myelopathy or radiculopathy, lumbar region: Secondary | ICD-10-CM

## 2022-07-24 DIAGNOSIS — Z79899 Other long term (current) drug therapy: Secondary | ICD-10-CM

## 2022-07-24 DIAGNOSIS — M533 Sacrococcygeal disorders, not elsewhere classified: Secondary | ICD-10-CM | POA: Diagnosis not present

## 2022-07-24 MED ORDER — OXYCODONE HCL 10 MG PO TABS: 10.0000 mg | ORAL_TABLET | Freq: Three times a day (TID) | ORAL | 0 refills | Status: DC | PRN

## 2022-07-24 MED ORDER — NALOXONE HCL 4 MG/0.1ML NA LIQD: 1.0000 | NASAL | 0 refills | Status: AC | PRN

## 2022-07-24 NOTE — Progress Notes (Signed)
Nursing Pain Medication Assessment:  Safety precautions to be maintained throughout the outpatient stay will include: orient to surroundings, keep bed in low position, maintain call bell within reach at all times, provide assistance with transfer out of bed and ambulation.  Medication Inspection Compliance: Pill count conducted under aseptic conditions, in front of the patient. Neither the pills nor the bottle was removed from the patient's sight at any time. Once count was completed pills were immediately returned to the patient in their original bottle.  Medication: Oxycodone IR Pill/Patch Count:  62 of 90 pills remain Pill/Patch Appearance: Markings consistent with prescribed medication Bottle Appearance: Standard pharmacy container. Clearly labeled. Filled Date: 26 / 04 / 2023 Last Medication intake:  Today

## 2022-07-24 NOTE — Patient Instructions (Signed)
____________________________________________________________________________________________  Patient Information update  To: All of our patients.  Re: Name change.  It has been made official that our current name, "[facility]"   will soon be changed to "[facility]".   The purpose of this change is to eliminate any confusion created by the concept of our practice being a "Medication Management Pain Clinic". In the past this has led to the misconception that we treat pain primarily by the use of prescription medications.  Nothing can be farther from the truth.   Understanding PAIN MANAGEMENT: To further understand what our practice does, you first have to understand that "Pain Management" is a subspecialty that requires additional training once a physician has completed their specialty training, which can be in either Anesthesia, Neurology, Psychiatry, or Physical Medicine and Rehabilitation (PMR). Each one of these contributes to the final approach taken by each physician to the management of their patient's pain. To be a "Pain Management Specialist" you must have first completed one of the specialty trainings below.  Anesthesiologists - trained in clinical pharmacology and interventional techniques such as nerve blockade and regional as well as central neuroanatomy. They are trained to block pain before, during, and after surgical interventions.  Neurologists - trained in the diagnosis and pharmacological treatment of complex neurological conditions, such as Multiple Sclerosis, Parkinson's, spinal cord injuries, and other systemic conditions that may be associated with symptoms that may include but are not limited to pain. They tend to rely primarily on the treatment of chronic pain using prescription medications.  Psychiatrist - trained in conditions affecting the psychosocial  wellbeing of patients including but not limited to depression, anxiety, schizophrenia, personality disorders, addiction, and other substance use disorders that may be associated with chronic pain. They tend to rely primarily on the treatment of chronic pain using prescription medications.   Physical Medicine and Rehabilitation (PMR) physicians, also known as physiatrists - trained to treat a wide variety of medical conditions affecting the brain, spinal cord, nerves, bones, joints, ligaments, muscles, and tendons. Their training is primarily aimed at treating patients that have suffered injuries that have caused severe physical impairment. Their training is primarily aimed at the physical therapy and rehabilitation of those patients. They may also work alongside orthopedic surgeons or neurosurgeons using their expertise in assisting surgical patients to recover after their surgeries.  INTERVENTIONAL PAIN MANAGEMENT is sub-subspecialty of Pain Management.  Our physicians are Board-certified in Anesthesia, Pain Management, and Interventional Pain Management.  This meaning that not only have they been trained and Board-certified in their specialty of Anesthesia, and subspecialty of Pain Management, but they have also received further training in the sub-subspecialty of Interventional Pain Management, in order to become Board-certified as INTERVENTIONAL PAIN MANAGEMENT SPECIALIST.    Mission: Our goal is to use our skills in  INTERVENTIONAL PAIN MANAGEMENT as alternatives to the chronic use of prescription opioid medications for the treatment of pain. To make this more clear, we have changed our name to reflect what we do and offer. We will continue to offer medication management assessment and recommendations, but we will not be taking over any patient's medication management.  ____________________________________________________________________________________________      ____________________________________________________________________________________________  National Pain Medication Shortage  The [place] is experiencing worsening drug shortages. These have had a negative widespread effect on patient care and treatment. Not expected to improve any time soon. Predicted to last past 2029.   Drug shortage list (generic   names) Oxycodone IR Oxycodone/APAP Oxymorphone IR Hydromorphone Hydrocodone/APAP Morphine  Where is the problem?  Manufacturing and supply level.  Will this shortage affect you?  Only if you take any of the above pain medications.  How? You may be unable to fill your prescription.  Your pharmacist may offer a "partial fill" of your prescription. (Warning: Do not accept partial fills.) Prescriptions partially filled cannot be transferred to another pharmacy. Read our Medication Rules and Regulation. Depending on how much medicine you are dependent on, you may experience withdrawals when unable to get the medication.  Recommendations: Consider ending your dependence on opioid pain medications. Ask your pain specialist to assist you with the process. Consider switching to a medication currently not in shortage, such as Buprenorphine. Talk to your pain specialist about this option. Consider decreasing your pain medication requirements by managing tolerance thru "Drug Holidays". This may help minimize withdrawals, should you run out of medicine. Control your pain thru the use of non-pharmacological interventional therapies.   Your prescriber: Prescribers cannot be blamed for shortages. Medication manufacturing and supply issues cannot be fixed by the prescriber.   NOTE: The prescriber is not responsible for supplying the medication, or solving supply issues. Work with your pharmacist to solve it. The patient is responsible for the decision to take or continue taking the medication and for identifying and securing a legal supply source. By  law, supplying the medication is the job and responsibility of the pharmacy. The prescriber is responsible for the evaluation, monitoring, and prescribing of these medications.   Prescribers will NOT: Re-issue prescriptions that have been partially filled. Re-issue prescriptions already sent to a pharmacy.  Re-send prescriptions to a different pharmacy because yours did not have your medication. Ask pharmacist to order more medicine or transfer the prescription to another pharmacy. (Read below.)  New 2023 regulation: "April 07, 2022 Revised Regulation Allows DEA-Registered Pharmacies to Transfer Electronic Prescriptions at a Patient's Request DEA Headquarters Division - Public Information Office Patients now have the ability to request their electronic prescription be transferred to another pharmacy without having to go back to their practitioner to initiate the request. This revised regulation went into effect on Monday, April 03, 2022.     At a patient's request, a DEA-registered retail pharmacy can now transfer an electronic prescription for a controlled substance (schedules II-V) to another DEA-registered retail pharmacy. Prior to this change, patients would have to go through their practitioner to cancel their prescription and have it re-issued to a different pharmacy. The process was taxing and time consuming for both patients and practitioners.    The Drug Enforcement Administration (DEA) published its intent to revise the process for transferring electronic prescriptions on June 25, 2020.  The final rule was published in the federal register on March 02, 2022 and went into effect 30 days later.  Under the final rule, a prescription can only be transferred once between pharmacies, and only if allowed under existing state or other applicable law. The prescription must remain in its electronic form; may not be altered in any way; and the transfer must be communicated directly between  two licensed pharmacists. It's important to note, any authorized refills transfer with the original prescription, which means the entire prescription will be filled at the same pharmacy".  Reference: https://www.dea.gov/stories/2023/2023-04/2022-09-01/revised-regulation-allows-dea-registered-pharmacies-transfer (DEA website announcement)  https://www.govinfo.gov/content/pkg/FR-2022-03-02/pdf/2023-15847.pdf (Federal Register  Department of Justice)   Federal Register / Vol. 88, No. 143 / Thursday, March 02, 2022 / Rules and Regulations DEPARTMENT OF JUSTICE  Drug Enforcement   Administration  21 CFR Part 1306  [Docket No. [id number]]  RIN 1117-AB64 Transfer of Electronic Prescriptions for Schedules II-V Controlled Substances Between Pharmacies for Initial Filling  ____________________________________________________________________________________________     ____________________________________________________________________________________________  Drug Holidays  What is a "Drug Holiday"? Drug Holiday: is the name given to the process of slowly tapering down and temporarily stopping the pain medication for the purpose of decreasing or eliminating tolerance to the drug.  Benefits Improved effectiveness Decreased required effective dose Improved pain control End dependence on high dose therapy Decrease cost of therapy Uncovering "opioid-induced hyperalgesia". (OIH)  What is "opioid hyperalgesia"? It is a paradoxical increase in pain caused by exposure to opioids. Stopping the opioid pain medication, contrary to the expected, it actually decreases or completely eliminates the pain. Ref.: "A comprehensive review of opioid-induced hyperalgesia". Marion Lee, et.al. Pain Physician. 2011 Mar-Apr;14(2):145-61.  What is tolerance? Tolerance: the progressive loss of effectiveness of a pain medicine due to repetitive use. A common problem of opioid pain medications.  How long should a "Drug  Holiday" last? Effectiveness depends on the patient staying off all opioid pain medicines for a minimum of 14 consecutive days. (2 weeks)  How about just taking less of the medicine? Does not work. Will not accomplish goal of eliminating the excess receptors.  How about switching to a different pain medicine? (AKA. "Opioid rotation") Does not work. Creates the illusion of effectiveness by taking advantage of inaccurate equivalent dose calculations between different opioids. -This "technique" was promoted by studies funded by pharmaceutical companies, such as [employer], creators of "OxyContin".  Can I stop the medicine "cold turkey"? Depends. You should always coordinate with your Pain Specialist to make the transition as smoothly as possible. Avoid stopping the medicine abruptly without consulting. We recommend a "slow taper".  What is a slow taper? Taper: refers to the gradual decrease in dose.   How do I stop/taper the dose? Slowly. Decrease the daily amount of pills that you take by one (1) pill every seven (7) days. This is called a "slow downward taper". Example: if you normally take four (4) pills per day, drop it to three (3) pills per day for seven (7) days, then to two (2) pills per day for seven (7) days, then to one (1) per day for seven (7) days, and then stop the medicine. The 14 day "Drug Holiday" starts on the first day without medicine.   Will I experience withdrawals? Unlikely with a slow taper.  What triggers withdrawals? Withdrawals are triggered by the sudden/abrupt stop of high dose opioids. Withdrawals can be avoided by slowly decreasing the dose over a prolonged period of time.  What are withdrawals? Symptoms associated with sudden/abrupt reduction/stopping of high-dose, long-term use of pain medication. Withdrawal are seldom seen on low dose therapy, or patients rarely taking opioid medication.  Early Withdrawal Symptoms may include: Agitation Anxiety Muscle  aches Increased tearing Insomnia Runny nose Sweating Yawning  Late symptoms may include: Abdominal cramping Diarrhea Dilated pupils Goose bumps Nausea Vomiting  (Last update: 07/16/2022) ____________________________________________________________________________________________    _______________________________________________________________________  Medication Rules  Purpose: To inform patients, and their family members, of our medication rules and regulations.  Applies to: All patients receiving prescriptions from our practice (written or electronic).  Pharmacy of record: This is the pharmacy where your electronic prescriptions will be sent. Make sure we have the correct one.  Electronic prescriptions: In compliance with the [place] Strengthen Opioid Misuse Prevention (STOP) Act of 2017 (Session Law 2017-74/H243), effective August 07, 2018, all controlled substances must be electronically prescribed. Written prescriptions, faxing, or calling prescriptions to a pharmacy will no longer be done.  Prescription refills: These will be provided only during in-person appointments. No medications will be renewed without a "face-to-face" evaluation with your provider. Applies to all prescriptions.  NOTE: The following applies primarily to controlled substances (Opioid* Pain Medications).   Type of encounter (visit): For patients receiving controlled substances, face-to-face visits are required. (Not an option and not up to the patient.)  Patient's responsibilities: Pain Pills: Bring all pain pills to every appointment (except for procedure appointments). Pill Bottles: Bring pills in original pharmacy bottle. Bring bottle, even if empty. Always bring the bottle of the most recent fill.  Medication refills: You are responsible for knowing and keeping track of what medications you are taking and when is it that you will need a refill. The day before your appointment: write a  list of all prescriptions that need to be refilled. The day of the appointment: give the list to the admitting nurse. Prescriptions will be written only during appointments. No prescriptions will be written on procedure days. If you forget a medication: it will not be "Called in", "Faxed", or "electronically sent". You will need to get another appointment to get these prescribed. No early refills. Do not call asking to have your prescription filled early. Partial  or short prescriptions: Occasionally your pharmacy may not have enough pills to fill your prescription.  NEVER ACCEPT a partial fill or a prescription that is short of the total amount of pills that you were prescribed.  With controlled substances the law allows 72 hours for the pharmacy to complete the prescription.  If the prescription is not completed within 72 hours, the pharmacist will require a new prescription to be written. This means that you will be short on your medicine and we WILL NOT send another prescription to complete your original prescription.  Instead, request the pharmacy to send a carrier to a nearby branch to get enough medication to provide you with your full prescription. Prescription Accuracy: You are responsible for carefully inspecting your prescriptions before leaving our office. Have the discharge nurse carefully go over each prescription with you, before taking them home. Make sure that your name is accurately spelled, that your address is correct. Check the name and dose of your medication to make sure it is accurate. Check the number of pills, and the written instructions to make sure they are clear and accurate. Make sure that you are given enough medication to last until your next medication refill appointment. Taking Medication: Take medication as prescribed. When it comes to controlled substances, taking less pills or less frequently than prescribed is permitted and encouraged. Never take more pills than  instructed. Never take the medication more frequently than prescribed.  Inform other Doctors: Always inform, all of your healthcare providers, of all the medications you take. Pain Medication from other Providers: You are not allowed to accept any additional pain medication from any other Doctor or Healthcare provider. There are two exceptions to this rule. (see below) In the event that you require additional pain medication, you are responsible for notifying us, as stated below. Cough Medicine: Often these contain an opioid, such as codeine or hydrocodone. Never accept or take cough medicine containing these opioids if you are already taking an opioid* medication. The combination may cause respiratory failure and death. Medication Agreement: You are responsible for carefully reading and following our Medication Agreement. This   must be signed before receiving any prescriptions from our practice. Safely store a copy of your signed Agreement. Violations to the Agreement will result in no further prescriptions. (Additional copies of our Medication Agreement are available upon request.) Laws, Rules, & Regulations: All patients are expected to follow all Federal and State Laws, Statutes, Rules, & Regulations. Ignorance of the Laws does not constitute a valid excuse.  Illegal drugs and Controlled Substances: The use of illegal substances (including, but not limited to marijuana and its derivatives) and/or the illegal use of any controlled substances is strictly prohibited. Violation of this rule may result in the immediate and permanent discontinuation of any and all prescriptions being written by our practice. The use of any illegal substances is prohibited. Adopted CDC guidelines & recommendations: Target dosing levels will be at or below 60 MME/day. Use of benzodiazepines** is not recommended.  Exceptions: There are only two exceptions to the rule of not receiving pain medications from other Healthcare  Providers. Exception #1 (Emergencies): In the event of an emergency (i.e.: accident requiring emergency care), you are allowed to receive additional pain medication. However, you are responsible for: As soon as you are able, call our office [phone], at any time of the day or night, and leave a message stating your name, the date and nature of the emergency, and the name and dose of the medication prescribed. In the event that your call is answered by a member of our staff, make sure to document and save the date, time, and the name of the person that took your information.  Exception #2 (Planned Surgery): In the event that you are scheduled by another doctor or dentist to have any type of surgery or procedure, you are allowed (for a period no longer than 30 days), to receive additional pain medication, for the acute post-op pain. However, in this case, you are responsible for picking up a copy of our "Post-op Pain Management for Surgeons" handout, and giving it to your surgeon or dentist. This document is available at our office, and does not require an appointment to obtain it. Simply go to our office during business hours (Monday-Thursday from 8:00 AM to 4:00 PM) (Friday 8:00 AM to 12:00 Noon) or if you have a scheduled appointment with us, prior to your surgery, and ask for it by name. In addition, you are responsible for: calling our office [phone], at any time of the day or night, and leaving a message stating your name, name of your surgeon, type of surgery, and date of procedure or surgery. Failure to comply with your responsibilities may result in termination of therapy involving the controlled substances. Medication Agreement Violation. Following the above rules, including your responsibilities will help you in avoiding a Medication Agreement Violation ("Breaking your Pain Medication Contract").  Consequences:  Not following the above rules may result in permanent discontinuation of  medication prescription therapy.  *Opioid medications include: morphine, codeine, oxycodone, oxymorphone, hydrocodone, hydromorphone, meperidine, tramadol, tapentadol, buprenorphine, fentanyl, methadone. **Benzodiazepine medications include: diazepam (Valium), alprazolam (Xanax), clonazepam (Klonopine), lorazepam (Ativan), clorazepate (Tranxene), chlordiazepoxide (Librium), estazolam (Prosom), oxazepam (Serax), temazepam (Restoril), triazolam (Halcion) (Last updated: 05/30/2022) ______________________________________________________________________    ______________________________________________________________________  Medication Recommendations and Reminders  Applies to: All patients receiving prescriptions (written and/or electronic).  Medication Rules & Regulations: You are responsible for reading, knowing, and following our "Medication Rules" document. These exist for your safety and that of others. They are not flexible and neither are we. Dismissing or ignoring them is an   act of "non-compliance" that may result in complete and irreversible termination of such medication therapy. For safety reasons, "non-compliance" will not be tolerated. As with the [place] fundamental legal principle of "ignorance of the law is no defense", we will accept no excuses for not having read and knowing the content of documents provided to you by our practice.  Pharmacy of record:  Definition: This is the pharmacy where your electronic prescriptions will be sent.  We do not endorse any particular pharmacy. It is up to you and your insurance to decide what pharmacy to use.  We do not restrict you in your choice of pharmacy. However, once we write for your prescriptions, we will NOT be re-sending more prescriptions to fix restricted supply problems created by your pharmacy, or your insurance.  The pharmacy listed in the electronic medical record should be the one where you want electronic prescriptions to be  sent. If you choose to change pharmacy, simply notify our nursing staff. Changes will be made only during your regular appointments and not over the phone.  Recommendations: Keep all of your pain medications in a safe place, under lock and key, even if you live alone. We will NOT replace lost, stolen, or damaged medication. We do not accept "Police Reports" as proof of medications having been stolen. After you fill your prescription, take 1 week's worth of pills and put them away in a safe place. You should keep a separate, properly labeled bottle for this purpose. The remainder should be kept in the original bottle. Use this as your primary supply, until it runs out. Once it's gone, then you know that you have 1 week's worth of medicine, and it is time to come in for a prescription refill. If you do this correctly, it is unlikely that you will ever run out of medicine. To make sure that the above recommendation works, it is very important that you make sure your medication refill appointments are scheduled at least 1 week before you run out of medicine. To do this in an effective manner, make sure that you do not leave the office without scheduling your next medication management appointment. Always ask the nursing staff to show you in your prescription , when your medication will be running out. Then arrange for the receptionist to get you a return appointment, at least 7 days before you run out of medicine. Do not wait until you have 1 or 2 pills left, to come in. This is very poor planning and does not take into consideration that we may need to cancel appointments due to bad weather, sickness, or emergencies affecting our staff. DO NOT ACCEPT A "Partial Fill": If for any reason your pharmacy does not have enough pills/tablets to completely fill or refill your prescription, do not allow for a "partial fill". The law allows the pharmacy to complete that prescription within 72 hours, without requiring a new  prescription. If they do not fill the rest of your prescription within those 72 hours, you will need a separate prescription to fill the remaining amount, which we will NOT provide. If the reason for the partial fill is your insurance, you will need to talk to the pharmacist about payment alternatives for the remaining tablets, but again, DO NOT ACCEPT A PARTIAL FILL, unless you can trust your pharmacist to obtain the remainder of the pills within 72 hours.  Prescription refills and/or changes in medication(s):  Prescription refills, and/or changes in dose or medication, will be conducted only   during scheduled medication management appointments. (Applies to both, written and electronic prescriptions.) No refills on procedure days. No medication will be changed or started on procedure days. No changes, adjustments, and/or refills will be conducted on a procedure day. Doing so will interfere with the diagnostic portion of the procedure. No phone refills. No medications will be "called into the pharmacy". No Fax refills. No weekend refills. No Holliday refills. No after hours refills.  Remember:  Business hours are:  Monday to Thursday 8:00 AM to 4:00 PM Provider's Schedule: Zamaya Rapaport, MD - Appointments are:  Medication management: Monday and Wednesday 8:00 AM to 4:00 PM Procedure day: Tuesday and Thursday 7:30 AM to 4:00 PM Bilal Lateef, MD - Appointments are:  Medication management: Tuesday and Thursday 8:00 AM to 4:00 PM Procedure day: Monday and Wednesday 7:30 AM to 4:00 PM (Last update: 05/30/2022) ______________________________________________________________________    ____________________________________________________________________________________________  WARNING: CBD (cannabidiol) & Delta (Delta-8 tetrahydrocannabinol) products.   Applicable to:  All individuals currently taking or considering taking CBD (cannabidiol) and, more important, all patients taking opioid  analgesic controlled substances (pain medication). (Example: oxycodone; oxymorphone; hydrocodone; hydromorphone; morphine; methadone; tramadol; tapentadol; fentanyl; buprenorphine; butorphanol; dextromethorphan; meperidine; codeine; etc.)  Introduction:  Recently there has been a drive towards the use of "natural" products for the treatment of different conditions, including pain anxiety and sleep disorders. Marijuana and hemp are two varieties of the cannabis genus plants. Marijuana and its derivatives are illegal, while hemp and its derivatives are not. Cannabidiol (CBD) and tetrahydrocannabinol (THC), are two natural compounds found in plants of the Cannabis genus. They can both be extracted from hemp or marijuana. Both compounds interact with your body's endocannabinoid system in very different ways. CBD is associated with pain relief (analgesia) while THC is associated with the psychoactive effects ("the high") obtained from the use of marijuana products. There are two main types of THC: Delta-9, which comes from the marijuana plant and it is illegal, and Delta-8, which comes from the hemp plant, and it is legal. (Both, Delta-9-THC and Delta-8-THC are psychoactive and give you "the high".)   Legality:  Marijuana and its derivatives: illegal Hemp and its derivatives: Legal (State dependent) UPDATE: (09/23/2021) The Drug Enforcement Agency (DEA) issued a letter stating that "delta" cannabinoids, including Delta-8-THCO and Delta-9-THCO, synthetically derived from hemp do not qualify as hemp and will be viewed as Schedule I drugs. (Schedule I drugs, substances, or chemicals are defined as drugs with no currently accepted medical use and a high potential for abuse. Some examples of Schedule I drugs are: heroin, lysergic acid diethylamide (LSD), marijuana (cannabis), 3,4-methylenedioxymethamphetamine (ecstasy), methaqualone, and peyote.) (https://www.dea.gov)  Legal status of CBD in [place]:  "Conditionally  Legal"  Reference: "FDA Regulation of Cannabis and Cannabis-Derived Products, Including Cannabidiol (CBD)" - https://www.fda.gov/news-events/public-health-focus/fda-regulation-cannabis-and-cannabis-derived-products-including-cannabidiol-cbd  Warning:  CBD is not FDA approved and has not undergo the same manufacturing controls as prescription drugs.  This means that the purity and safety of available CBD may be questionable. Most of the time, despite manufacturer's claims, it is contaminated with THC (delta-9-tetrahydrocannabinol - the chemical in marijuana responsible for the "HIGH").  When this is the case, the THC contaminant will trigger a positive urine drug screen (UDS) test for Marijuana (carboxy-THC).   The FDA recently put out a warning about 5 things that everyone should be aware of regarding Delta-8 THC: Delta-8 THC products have not been evaluated or approved by the FDA for safe use and may be marketed in ways that put the public health at   risk. The FDA has received adverse event reports involving delta-8 THC-containing products. Delta-8 THC has psychoactive and intoxicating effects. Delta-8 THC manufacturing often involve use of potentially harmful chemicals to create the concentrations of delta-8 THC claimed in the marketplace. The final delta-8 THC product may have potentially harmful by-products (contaminants) due to the chemicals used in the process. Manufacturing of delta-8 THC products may occur in uncontrolled or unsanitary settings, which may lead to the presence of unsafe contaminants or other potentially harmful substances. Delta-8 THC products should be kept out of the reach of children and pets.  NOTE: Because a positive UDS for any illicit substance is a violation of our medication agreement, your opioid analgesics (pain medicine) may be permanently discontinued.  MORE ABOUT CBD  General Information: CBD was discovered in 1940 and it is a derivative of the cannabis sativa  genus plants (Marijuana and Hemp). It is one of the 113 identified substances found in Marijuana. It accounts for up to 40% of the plant's extract. As of 2018, preliminary clinical studies on CBD included research for the treatment of anxiety, movement disorders, and pain. CBD is available and consumed in multiple forms, including inhalation of smoke or vapor, as an aerosol spray, and by mouth. It may be supplied as an oil containing CBD, capsules, dried cannabis, or as a liquid solution. CBD is thought not to be as psychoactive as THC (delta-9-tetrahydrocannabinol - the chemical in marijuana responsible for the "HIGH"). Studies suggest that CBD may interact with different biological target receptors in the body, including cannabinoid and other neurotransmitter receptors. As of 2018 the mechanism of action for its biological effects has not been determined.  Side-effects  Adverse reactions: Dry mouth, diarrhea, decreased appetite, fatigue, drowsiness, malaise, weakness, sleep disturbances, and others.  Drug interactions:  CBD may interact with medications such as blood-thinners. CBD causes drowsiness on its own and it will increase drowsiness caused by other medications, including antihistamines (such as Benadryl), benzodiazepines (Xanax, Ativan, Valium), antipsychotics, antidepressants, opioids, alcohol and supplements such as kava, melatonin and St. John's Wort.  Other drug interactions: Brivaracetam (Briviact); Caffeine; Carbamazepine (Tegretol); Citalopram (Celexa); Clobazam (Onfi); Eslicarbazepine (Aptiom); Everolimus (Zostress); Lithium; Methadone (Dolophine); Rufinamide (Banzel); Sedative medications (CNS depressants); Sirolimus (Rapamune); Stiripentol (Diacomit); Tacrolimus (Prograf); Tamoxifen ; Soltamox); Topiramate (Topamax); Valproate; Warfarin (Coumadin); Zonisamide. (Last update: 07/17/2022) ____________________________________________________________________________________________    ____________________________________________________________________________________________  Naloxone Nasal Spray  Why am I receiving this medication? [place] STOP ACT requires that all patients taking high dose opioids or at risk of opioids respiratory depression, be prescribed an opioid reversal agent, such as Naloxone (AKA: Narcan).  What is this medication? NALOXONE (nal OX one) treats opioid overdose, which causes slow or shallow breathing, severe drowsiness, or trouble staying awake. Call emergency services after using this medication. You may need additional treatment. Naloxone works by reversing the effects of opioids. It belongs to a group of medications called opioid blockers.  COMMON BRAND NAME(S): Kloxxado, Narcan  What should I tell my care team before I take this medication? They need to know if you have any of these conditions: Heart disease Substance use disorder An unusual or allergic reaction to naloxone, other medications, foods, dyes, or preservatives Pregnant or trying to get pregnant Breast-feeding  When to use this medication? This medication is to be used for the treatment of respiratory depression (less than 8 breaths per minute) secondary to opioid overdose.   How to use this medication? This medication is for use in the nose. Lay the person on their   back. Support their neck with your hand and allow the head to tilt back before giving the medication. The nasal spray should be given into 1 nostril. After giving the medication, move the person onto their side. Do not remove or test the nasal spray until ready to use. Get emergency medical help right away after giving the first dose of this medication, even if the person wakes up. You should be familiar with how to recognize the signs and symptoms of a narcotic overdose. If more doses are needed, give the additional dose in the other nostril. Talk to your care team about the use of this medication in children.  While this medication may be prescribed for children as young as newborns for selected conditions, precautions do apply.  Naloxone Overdosage: If you think you have taken too much of this medicine contact a poison control center or emergency room at once.  NOTE: This medicine is only for you. Do not share this medicine with others.  What if I miss a dose? This does not apply.  What may interact with this medication? This is only used during an emergency. No interactions are expected during emergency use. This list may not describe all possible interactions. Give your health care provider a list of all the medicines, herbs, non-prescription drugs, or dietary supplements you use. Also tell them if you smoke, drink alcohol, or use illegal drugs. Some items may interact with your medicine.  What should I watch for while using this medication? Keep this medication ready for use in the case of an opioid overdose. Make sure that you have the phone number of your care team and local hospital ready. You may need to have additional doses of this medication. Each nasal spray contains a single dose. Some emergencies may require additional doses. After use, bring the treated person to the nearest hospital or call 911. Make sure the treating care team knows that the person has received a dose of this medication. You will receive additional instructions on what to do during and after use of this medication before an emergency occurs.  What side effects may I notice from receiving this medication? Side effects that you should report to your care team as soon as possible: Allergic reactions--skin rash, itching, hives, swelling of the face, lips, tongue, or throat Side effects that usually do not require medical attention (report these to your care team if they continue or are bothersome): Constipation Dryness or irritation inside the nose Headache Increase in blood pressure Muscle spasms Stuffy  nose Toothache This list may not describe all possible side effects. Call your doctor for medical advice about side effects. You may report side effects to FDA at 1-800-FDA-1088.  Where should I keep my medication? Because this is an emergency medication, you should keep it with you at all times.  Keep out of the reach of children and pets. Store between 20 and 25 degrees C (68 and 77 degrees F). Do not freeze. Throw away any unused medication after the expiration date. Keep in original box until ready to use.  NOTE: This sheet is a summary. It may not cover all possible information. If you have questions about this medicine, talk to your doctor, pharmacist, or health care provider.   2023 Elsevier/Gold Standard (2021-04-01 00:00:00)  ____________________________________________________________________________________________   

## 2022-07-26 ENCOUNTER — Telehealth: Payer: Self-pay | Admitting: Family Medicine

## 2022-07-26 DIAGNOSIS — Z950 Presence of cardiac pacemaker: Secondary | ICD-10-CM

## 2022-07-26 DIAGNOSIS — R011 Cardiac murmur, unspecified: Secondary | ICD-10-CM

## 2022-07-26 DIAGNOSIS — G4733 Obstructive sleep apnea (adult) (pediatric): Secondary | ICD-10-CM

## 2022-07-26 DIAGNOSIS — R001 Bradycardia, unspecified: Secondary | ICD-10-CM

## 2022-07-26 NOTE — Telephone Encounter (Signed)
Patient called in stating that his cardiologist is retiring,however he has found another one in [place].They are requiring a referral be sent over in order for him to began being seen there.  Jonelle Sidle  [phone]

## 2022-07-26 NOTE — Telephone Encounter (Signed)
New referral placed for [facility] cardiology - Dr Diona Browner.

## 2022-07-27 ENCOUNTER — Telehealth: Payer: Self-pay

## 2022-07-27 LAB — CALCULI, WITH PHOTOGRAPH (CLINICAL LAB)
Calcium Oxalate Dihydrate: 20 %
Calcium Oxalate Monohydrate: 80 %
Weight Calculi: 30 mg

## 2022-07-27 NOTE — Progress Notes (Signed)
Chronic Care Management Pharmacy Assistant   Name: Mark [name]  MRN: [id number] DOB: July 21, 1956  Reason for Encounter: CCM (Hypertension Disease State)  Recent office visits:  None since last CCM contact  Recent consult visits:  07/24/22 [name], MD (Pain Med) Chronic Pain Syndrome No med changes 07/19/22 Michiel Cowboy, PA (Urology) Nephrolithiasis F/U in February   Hospital visits:  None in previous 6 months  Medications: Outpatient Encounter Medications as of 07/27/2022  Medication Sig Note   amLODipine (NORVASC) 2.5 MG tablet Take 1 tablet (2.5 mg total) by mouth daily.    Ascorbic Acid (VITAMIN C) 1000 MG tablet Take 1,000 mg by mouth daily.    aspirin 81 MG tablet Take 81 mg by mouth daily.    atorvastatin (LIPITOR) 20 MG tablet Take 1 tablet (20 mg total) by mouth at bedtime.    B Complex-C (SUPER B COMPLEX/VITAMIN C PO) Take by mouth daily.    cetirizine (ZYRTEC) 10 MG tablet Take 10 mg by mouth at bedtime.    Cholecalciferol (VITAMIN D3 PO) Take 2,000 Int'l Units by mouth daily.    clindamycin (CLEOCIN) 150 MG capsule Take 150 mg by mouth as needed. Prior to dental appt    Cyanocobalamin (VITAMIN B12 PO) Take by mouth daily.    cyclobenzaprine (FLEXERIL) 10 MG tablet TAKE ONE-HALF TO ONE TABLET BY MOUTH 2 TIMES DAILY AS NEEDED FOR MUSCLE SPASMS    ferrous sulfate 325 (65 FE) MG EC tablet Take 325 mg by mouth daily with breakfast.    FLUoxetine (PROZAC) 20 MG capsule Take 1 capsule (20 mg total) by mouth daily.    FLUoxetine (PROZAC) 40 MG capsule Take 1 capsule (40 mg total) by mouth daily.    fluticasone (FLONASE) 50 MCG/ACT nasal spray Place 2 sprays into both nostrils daily.    furosemide (LASIX) 20 MG tablet Take 1 tablet (20 mg total) by mouth daily.    Garlic 100 MG TABS Take by mouth daily.    metFORMIN (GLUCOPHAGE-XR) 500 MG 24 hr tablet Take 1 tablet (500 mg total) by mouth daily with breakfast.    Multiple Vitamins-Minerals (CERTAVITE  SENIOR/ANTIOXIDANT) TABS Take 1 tablet by mouth daily.    naloxone (NARCAN) nasal spray 4 mg/0.1 mL Place 1 spray into the nose as needed for up to 365 doses (for opioid-induced respiratory depresssion). In case of emergency (overdose), spray once into each nostril. If no response within 3 minutes, repeat application and call 911.    naproxen (NAPROSYN) 500 MG tablet TAKE 1 TABLET pop TWICE DAILY AS NEEDED FOR MODERATE PAIN    Omega-3 Fatty Acids (FISH OIL) 1000 MG CAPS Take by mouth daily.    omeprazole (PRILOSEC) 20 MG capsule Take 1 capsule (20 mg total) by mouth daily.    [START ON 08/06/2022] Oxycodone HCl 10 MG TABS Take 1 tablet (10 mg total) by mouth every 8 (eight) hours as needed. Each refill must last 30 days.    [START ON 09/05/2022] Oxycodone HCl 10 MG TABS Take 1 tablet (10 mg total) by mouth every 8 (eight) hours as needed. Each refill must last 30 days.    [START ON 10/05/2022] Oxycodone HCl 10 MG TABS Take 1 tablet (10 mg total) by mouth every 8 (eight) hours as needed. Each refill must last 30 days. 07/24/2022: WARNING: Not a Duplicate. Future prescription. DO NOT DELETE during hospital medication reconciliation or at discharge. [facility] Chronic Pain Management Patient   potassium citrate (UROCIT-K) 10 MEQ (1080 MG) SR  tablet Take 1 tablet (10 mEq total) by mouth 2 (two) times daily.    rOPINIRole (REQUIP) 2 MG tablet Take 1 tablet (2 mg total) by mouth at bedtime.    No facility-administered encounter medications on file as of 07/27/2022.    Recent Office Vitals: BP Readings from Last 3 Encounters:  07/24/22 138/71  07/19/22 (!) 146/81  05/01/22 (!) 152/78   Pulse Readings from Last 3 Encounters:  07/24/22 83  07/19/22 77  05/01/22 69    Wt Readings from Last 3 Encounters:  07/24/22 220 lb (99.8 kg)  07/19/22 220 lb (99.8 kg)  05/01/22 219 lb (99.3 kg)     Kidney Function Lab Results  Component Value Date/Time   CREATININE 0.90 12/12/2021 09:07 AM   CREATININE 1.06  10/05/2021 02:45 PM   CREATININE 1.01 12/09/2013 04:01 PM   GFR 89.99 12/12/2021 09:07 AM   GFRNONAA >60 10/29/2020 01:42 AM   GFRNONAA >60 12/09/2013 04:01 PM   GFRAA >60 12/06/2017 10:40 AM   GFRAA >60 12/09/2013 04:01 PM       Latest Ref Rng & Units 12/12/2021    9:07 AM 10/05/2021    2:45 PM 10/29/2020    1:42 AM  BMP  Glucose 70 - 99 mg/dL 243  148  140   BUN 6 - 23 mg/dL 20  27  13    Creatinine 0.40 - 1.50 mg/dL 0.90  1.06  0.89   Sodium 135 - 145 mEq/L 138  138  137   Potassium 3.5 - 5.1 mEq/L 3.8  4.3  3.7   Chloride 96 - 112 mEq/L 100  101  105   CO2 19 - 32 mEq/L 25  30  22    Calcium 8.4 - 10.5 mg/dL 9.1  9.3  8.6    Attempted contact with patient 3 times. Unsuccessful outreach. Will atttempt contact next month.  Current antihypertensive regimen:  Furosemide 20 mg daily Amlodipine 2.5 mg daily   Adherence Review: Is the patient currently on ACE/ARB medication? No Does the patient have >5 day gap between last estimated fill dates? Yes Atorvastatin 20 mg    Star Rating Drugs:  Medication:                Last Fill:         Day Supply Atorvastatin 20 mg      01/12/2022      90 Metformin 500 mg       07/16/2022      90 Verified with [facility]   Care Gaps: Annual wellness visit in last year? Yes 10/31/2021 Most Recent BP reading: 136/72 on 12/19/2021   If Diabetic: Most recent A1C reading: 7.8 on 12/12/2021 Last eye exam / retinopathy screening: Never done Last diabetic foot exam: Never done  Summary of recommendations from last [place] visit (Date:05/30/22)  Summary: CCM F/U visit -Reviewed medications; pt affirms compliance -DM: A1c 6.1% (03/2022), significantly improved with dietary changes and metformin -HTN/HF: BP <130/80 in recent primary care visit   Recommendations/Changes made from today's visit: -No med changes   Plan: -[place] will call patient 2 months for BP update -Pharmacist follow up televisit  PRN -PCP appt 08/28/22  PCP appointment on 08/28/2022  Charlene Brooke, CPP notified  Marijean Niemann, [place] Assistant [phone]

## 2022-07-27 NOTE — Telephone Encounter (Signed)
Spoke with pt relaying Dr. G's message.  Pt expresses his thanks.  ?

## 2022-07-28 ENCOUNTER — Telehealth: Payer: Self-pay | Admitting: Family Medicine

## 2022-07-28 NOTE — Telephone Encounter (Signed)
  Encourage patient to contact the pharmacy for refills or they can request refills through [facility]  LAST APPOINTMENT DATE:  Please schedule appointment if longer than 1 year  NEXT APPOINTMENT DATE:  MEDICATION:  clindamycin (CLEOCIN) 150 MG capsule  Is the patient out of medication?   PHARMACY: [facility] - [place], [place] - [address]    Let patient know to contact pharmacy at the end of the day to make sure medication is ready.  Please notify patient to allow 48-72 hours to process  CLINICAL FILLS OUT ALL BELOW:   LAST REFILL:  QTY:  REFILL DATE:    OTHER COMMENTS:    Okay for refill?  Please advise

## 2022-07-28 NOTE — Telephone Encounter (Signed)
Called patient normally given by cardiology but has not established with new provider. He has app with dentis 08/22/22 wanted to know if you can call in refill for him.

## 2022-08-01 NOTE — Telephone Encounter (Signed)
Why was he receiving antibiotics for dental procedures? Was it for pacemaker?  Pacemaker is usually not an indication to receive dental prophylaxis antibiotics.

## 2022-08-01 NOTE — Telephone Encounter (Signed)
Spoke with pt asking Dr. Timoteo Expose questions. Pt confirms abx is for pacemaker. I relayed Dr. Timoteo Expose message and pt states cards has always had pt take abx for dental procedures but pt is between cardiologists right now.

## 2022-08-03 MED ORDER — DOXYCYCLINE HYCLATE 100 MG PO TABS: 100.0000 mg | ORAL_TABLET | Freq: Once | ORAL | 0 refills | Status: AC

## 2022-08-03 NOTE — Telephone Encounter (Signed)
Spoke with pt relaying Dr. G's message.  Pt verbalizes understanding and expresses his thanks.  

## 2022-08-03 NOTE — Telephone Encounter (Addendum)
I don't recommend clindamycin for this given significant side effects and increased risk of antibiotic-related diarrhea. I've sent in Rx for doxycycline 100mg  x1, but want him to discuss with next cardiologist need for antibiotics. Take 30-60 min prior to procedure

## 2022-08-03 NOTE — Addendum Note (Signed)
Addended by: [name] on: 08/03/2022 10:01 AM   Modules accepted: Orders

## 2022-08-22 NOTE — Telephone Encounter (Signed)
Dylan from [facility] called over and wanted to make sure that this medication was pre-med for dental procedure. She can be reached at [phone], she may be at lunch but you are able to speak with manager or someone else in the office. Thank you!

## 2022-08-22 NOTE — Telephone Encounter (Signed)
I don't think he has an indication for prophylactic antibiotics.  I did send in doxycycline which should have sufficed.  [place] for patient to wait to establish with cardiologist to determine need/abx choice prior to dental procedure.

## 2022-08-22 NOTE — Telephone Encounter (Signed)
Spoke to [employer] and was advised that patient showed up stating that he took Doxycyline prescribed by DR. Danise Mina. Dylan stated that they did not see the patient today because they did not feel that it was enough medication for his procedure. Dylan stated that they have on hand Clindamycin in case a patient shows up for an appointment and forgot to pre-medicate. Dylan stated that they did not want to give him anything else since he had already taken an antibiotic. Dylan stated that patient is going to reach out to his cardiologist and will reschedule once he is sure what is needed for dental work.

## 2022-08-23 NOTE — Telephone Encounter (Signed)
Spoke with Mark [name] relaying Dr. Synthia Innocent message. Verbalizes understanding.

## 2022-08-28 ENCOUNTER — Encounter: Payer: Self-pay | Admitting: Family Medicine

## 2022-08-28 ENCOUNTER — Ambulatory Visit (INDEPENDENT_AMBULATORY_CARE_PROVIDER_SITE_OTHER): Payer: Medicare Other | Admitting: Family Medicine

## 2022-08-28 VITALS — BP 138/80 | HR 79 | Temp 97.8°F | Ht 67.0 in | Wt 222.5 lb

## 2022-08-28 DIAGNOSIS — E669 Obesity, unspecified: Secondary | ICD-10-CM

## 2022-08-28 DIAGNOSIS — I10 Essential (primary) hypertension: Secondary | ICD-10-CM | POA: Diagnosis not present

## 2022-08-28 DIAGNOSIS — E1169 Type 2 diabetes mellitus with other specified complication: Secondary | ICD-10-CM

## 2022-08-28 LAB — POCT GLYCOSYLATED HEMOGLOBIN (HGB A1C): Hemoglobin A1C: 6 % — AB (ref 4.0–5.6)

## 2022-08-28 MED ORDER — FLUTICASONE PROPIONATE 50 MCG/ACT NA SUSP: 2.0000 | Freq: Every day | NASAL | 1 refills | Status: DC

## 2022-08-28 MED ORDER — ATORVASTATIN CALCIUM 20 MG PO TABS: 20.0000 mg | ORAL_TABLET | Freq: Every day | ORAL | 1 refills | Status: DC

## 2022-08-28 NOTE — Patient Instructions (Addendum)
Schedule GI appointment to discuss colonoscopy given positive Cologuard 2019. Let me know if you need a referral.  Good to see you today. Continue current medicines including once daily metformin XR.  Return in 4 months for physical.

## 2022-08-28 NOTE — Progress Notes (Signed)
Patient ID: Mark [name], male    DOB: 10/04/1955, 67 y.o.   MRN: [id number]  This visit was conducted in person.  BP 138/80   Pulse 79   Temp 97.8 F (36.6 C) (Temporal)   Ht 5\' 7"  (1.702 m)   Wt 222 lb 8 oz (100.9 kg)   SpO2 96%   BMI 34.85 kg/m    CC: DM f/u visit  Subjective:   HPI: Mark [name] is a 67 y.o. male presenting on 08/28/2022 for Medical Management of Chronic Issues (Here for 5 mo DM f/u.)   Positive Cologuard 08/2017 - has postponed GI f/u until now. He states he's been busy caring for her mother.   Using O'Keef's cream to feet with benefit.   HTN - Compliant with current antihypertensive regimen of amlodipine 2.5mg  daily, lasix 20mg  daily. Does check blood pressures at home and brings log over the past month: 115-145/70-80s. No low blood pressure readings or symptoms of dizziness/syncope. Denies HA, vision changes, CP/tightness, SOB, leg swelling.   DM - does not regularly check sugars. Latest checked last month - 185, 227 non-fasting. Compliant with antihyperglycemic regimen which includes: metformin XR 500mg  daily. Denies low sugars or hypoglycemic symptoms. Denies paresthesias, blurry vision. Last diabetic eye exam 04/2022. Glucometer brand: TrueMetrix. Last foot exam: 03/2022. DSME: declines. Lab Results  Component Value Date   HGBA1C 6.0 (A) 08/28/2022   Diabetic Foot Exam - Simple   Simple Foot Form Diabetic Foot exam was performed with the following findings: Yes 08/28/2022  3:00 PM  Visual Inspection No deformities, no ulcerations, no other skin breakdown bilaterally: Yes Sensation Testing Intact to touch and monofilament testing bilaterally: Yes Pulse Check Posterior Tibialis and Dorsalis pulse intact bilaterally: Yes Comments 2+ PT on left, 2+ DP on right    Lab Results  Component Value Date   MICROALBUR 1.3 12/12/2021         Relevant past medical, surgical, family and social history reviewed and updated as indicated. Interim medical  history since our last visit reviewed. Allergies and medications reviewed and updated. Outpatient Medications Prior to Visit  Medication Sig Dispense Refill   amLODipine (NORVASC) 2.5 MG tablet Take 1 tablet (2.5 mg total) by mouth daily. 90 tablet 3   Ascorbic Acid (VITAMIN C) 1000 MG tablet Take 1,000 mg by mouth daily.     aspirin 81 MG tablet Take 81 mg by mouth daily.     B Complex-C (SUPER B COMPLEX/VITAMIN C PO) Take by mouth daily.     cetirizine (ZYRTEC) 10 MG tablet Take 10 mg by mouth at bedtime.     Cholecalciferol (VITAMIN D3 PO) Take 2,000 Int'l Units by mouth daily.     clindamycin (CLEOCIN) 150 MG capsule Take 150 mg by mouth as needed. Prior to dental appt     Cyanocobalamin (VITAMIN B12 PO) Take by mouth daily.     cyclobenzaprine (FLEXERIL) 10 MG tablet TAKE ONE-HALF TO ONE TABLET BY MOUTH 2 TIMES DAILY AS NEEDED FOR MUSCLE SPASMS 40 tablet 0   ferrous sulfate 325 (65 FE) MG EC tablet Take 325 mg by mouth daily with breakfast.     FLUoxetine (PROZAC) 20 MG capsule Take 1 capsule (20 mg total) by mouth daily. 90 capsule 3   FLUoxetine (PROZAC) 40 MG capsule Take 1 capsule (40 mg total) by mouth daily. 90 capsule 3   furosemide (LASIX) 20 MG tablet Take 1 tablet (20 mg total) by mouth daily. 90 tablet 4  Garlic 643 MG TABS Take by mouth daily.     metFORMIN (GLUCOPHAGE-XR) 500 MG 24 hr tablet Take 1 tablet (500 mg total) by mouth daily with breakfast. 90 tablet 2   Multiple Vitamins-Minerals (CERTAVITE SENIOR/ANTIOXIDANT) TABS Take 1 tablet by mouth daily.     naloxone (NARCAN) nasal spray 4 mg/0.1 mL Place 1 spray into the nose as needed for up to 365 doses (for opioid-induced respiratory depresssion). In case of emergency (overdose), spray once into each nostril. If no response within 3 minutes, repeat application and call 329. 1 each 0   naproxen (NAPROSYN) 500 MG tablet TAKE 1 TABLET pop TWICE DAILY AS NEEDED FOR MODERATE PAIN 90 tablet 0   Omega-3 Fatty Acids (FISH OIL)  1000 MG CAPS Take by mouth daily.     omeprazole (PRILOSEC) 20 MG capsule Take 1 capsule (20 mg total) by mouth daily. 90 capsule 3   Oxycodone HCl 10 MG TABS Take 1 tablet (10 mg total) by mouth every 8 (eight) hours as needed. Each refill must last 30 days. 90 tablet 0   [START ON 09/05/2022] Oxycodone HCl 10 MG TABS Take 1 tablet (10 mg total) by mouth every 8 (eight) hours as needed. Each refill must last 30 days. 90 tablet 0   [START ON 10/05/2022] Oxycodone HCl 10 MG TABS Take 1 tablet (10 mg total) by mouth every 8 (eight) hours as needed. Each refill must last 30 days. 90 tablet 0   potassium citrate (UROCIT-K) 10 MEQ (1080 MG) SR tablet Take 1 tablet (10 mEq total) by mouth 2 (two) times daily. 180 tablet 3   rOPINIRole (REQUIP) 2 MG tablet Take 1 tablet (2 mg total) by mouth at bedtime. 90 tablet 3   atorvastatin (LIPITOR) 20 MG tablet Take 1 tablet (20 mg total) by mouth at bedtime. 90 tablet 1   fluticasone (FLONASE) 50 MCG/ACT nasal spray Place 2 sprays into both nostrils daily. 48 g 4   No facility-administered medications prior to visit.     Per HPI unless specifically indicated in ROS section below Review of Systems  Objective:  BP 138/80   Pulse 79   Temp 97.8 F (36.6 C) (Temporal)   Ht 5\' 7"  (1.702 m)   Wt 222 lb 8 oz (100.9 kg)   SpO2 96%   BMI 34.85 kg/m   Wt Readings from Last 3 Encounters:  08/28/22 222 lb 8 oz (100.9 kg)  07/24/22 220 lb (99.8 kg)  07/19/22 220 lb (99.8 kg)      Physical Exam Vitals and nursing note reviewed.  Constitutional:      Appearance: Normal appearance. He is not ill-appearing.  Eyes:     Extraocular Movements: Extraocular movements intact.     Conjunctiva/sclera: Conjunctivae normal.     Pupils: Pupils are equal, round, and reactive to light.  Cardiovascular:     Rate and Rhythm: Normal rate and regular rhythm.     Pulses: Normal pulses.     Heart sounds: Normal heart sounds. No murmur heard. Pulmonary:     Effort: Pulmonary  effort is normal. No respiratory distress.     Breath sounds: Normal breath sounds. No wheezing, rhonchi or rales.  Musculoskeletal:     Right lower leg: No edema.     Left lower leg: No edema.     Comments: See HPI for foot exam if done  Skin:    General: Skin is warm and dry.     Findings: No rash.  Neurological:  Mental Status: He is alert.  Psychiatric:        Mood and Affect: Mood normal.        Behavior: Behavior normal.       Results for orders placed or performed in visit on 08/28/22  POCT glycosylated hemoglobin (Hb A1C)  Result Value Ref Range   Hemoglobin A1C 6.0 (A) 4.0 - 5.6 %   HbA1c POC (<> result, manual entry)     HbA1c, POC (prediabetic range)     HbA1c, POC (controlled diabetic range)     Lab Results  Component Value Date   CREATININE 0.90 12/12/2021   BUN 20 12/12/2021   NA 138 12/12/2021   K 3.8 12/12/2021   CL 100 12/12/2021   CO2 25 12/12/2021   Assessment & Plan:   Problem List Items Addressed This Visit     Obesity, Class I, BMI 30.0-34.9 (see actual BMI)    Encouraged healthy diet and lifestyle choices to affect sustainable weight loss.  Rec start walking routine.       Hypertension    Chronic, stable on low dose amlodipine + lasix - continue this.       Relevant Medications   atorvastatin (LIPITOR) 20 MG tablet   Type 2 diabetes mellitus with other specified complication (HCC) - Primary    Chronic, stable on low dose metformin XR - continue.  Declines DSME.       Relevant Medications   atorvastatin (LIPITOR) 20 MG tablet   Other Relevant Orders   POCT glycosylated hemoglobin (Hb A1C) (Completed)     Meds ordered this encounter  Medications   atorvastatin (LIPITOR) 20 MG tablet    Sig: Take 1 tablet (20 mg total) by mouth at bedtime.    Dispense:  90 tablet    Refill:  1   fluticasone (FLONASE) 50 MCG/ACT nasal spray    Sig: Place 2 sprays into both nostrils daily.    Dispense:  48 g    Refill:  1    Orders Placed  This Encounter  Procedures   POCT glycosylated hemoglobin (Hb A1C)    Patient Instructions  Schedule GI appointment to discuss colonoscopy given positive Cologuard 2019. Let me know if you need a referral.  Good to see you today. Continue current medicines including once daily metformin XR.  Return in 4 months for physical.   Follow up plan: Return in about 4 months (around 12/27/2022), or if symptoms worsen or fail to improve, for annual exam, prior fasting for blood work.  Ria Bush, MD

## 2022-08-28 NOTE — Assessment & Plan Note (Signed)
Chronic, stable on low dose metformin XR - continue.  Declines DSME.

## 2022-08-28 NOTE — Assessment & Plan Note (Signed)
Chronic, stable on low dose amlodipine + lasix - continue this.

## 2022-08-28 NOTE — Assessment & Plan Note (Addendum)
Encouraged healthy diet and lifestyle choices to affect sustainable weight loss.  Rec start walking routine.

## 2022-09-11 ENCOUNTER — Other Ambulatory Visit: Payer: Self-pay | Admitting: *Deleted

## 2022-09-11 DIAGNOSIS — N200 Calculus of kidney: Secondary | ICD-10-CM

## 2022-09-12 DIAGNOSIS — I480 Paroxysmal atrial fibrillation: Secondary | ICD-10-CM | POA: Diagnosis not present

## 2022-09-13 ENCOUNTER — Ambulatory Visit
Admission: RE | Admit: 2022-09-13 | Discharge: 2022-09-13 | Disposition: A | Discharge status: Home / Self Care | Payer: Medicare Other | Source: Ambulatory Visit | Attending: Urology | Admitting: Urology

## 2022-09-13 ENCOUNTER — Ambulatory Visit: Payer: Medicare Other | Admitting: Urology

## 2022-09-13 ENCOUNTER — Encounter: Payer: Self-pay | Admitting: Urology

## 2022-09-13 ENCOUNTER — Ambulatory Visit
Admission: RE | Admit: 2022-09-13 | Discharge: 2022-09-13 | Disposition: A | Discharge status: Home / Self Care | Payer: Medicare Other | Attending: Urology | Admitting: Urology

## 2022-09-13 VITALS — BP 128/79 | HR 90 | Ht 67.0 in | Wt 219.0 lb

## 2022-09-13 DIAGNOSIS — N200 Calculus of kidney: Secondary | ICD-10-CM | POA: Diagnosis not present

## 2022-09-13 DIAGNOSIS — I878 Other specified disorders of veins: Secondary | ICD-10-CM | POA: Diagnosis not present

## 2022-09-13 NOTE — Progress Notes (Unsigned)
09/13/2022 1:34 PM   Mark [name] 07/11/1956 [id number]  Referring provider: Ria Bush, MD [place],  [place] [address]  Chief Complaint  Patient presents with   Nephrolithiasis    HPI: 67 y.o. male presents for annual follow-up.  Saw [name] December 2013 passing sand-like particles.  They were sent for analysis and mixed calcium oxalate 80/20 He has mild low back soreness.  No colicky pain No bothersome LUTS Denies dysuria, gross hematuria PSA 12/12/2021 stable at 0.59  PMH: Past Medical History:  Diagnosis Date   Anxiety    Arthritis    Chronic pain    Depression    Heart murmur    History of kidney stones 11/17/2015   removed two kidney stones about 1 cm in size   History of permanent cardiac pacemaker placement 05/31/2015   Hydrocele in adult 05/07/2015   Hypertension    Kidney stones    Migraine    MRSA carrier 2019   Presence of permanent cardiac pacemaker    Prostatitis    Sleep apnea    Does not wear CPAP    Surgical History: Past Surgical History:  Procedure Laterality Date   CARDIOVASCULAR STRESS TEST  2014   Dr Heide Spark RELEASE Bilateral 2006   [place]  2008   C5/6   CHOLECYSTECTOMY N/A 10/27/2020   Procedure: LAPAROSCOPIC CHOLECYSTECTOMY;  Surgeon: Georganna Skeans, MD;  Location: [facility];  Service: General;  Laterality: N/A;   COLONOSCOPY  07/2007   prostate nodule, hemorrhoid, rpt 5 yrs [name])   ERCP N/A 10/26/2020   Procedure: ENDOSCOPIC RETROGRADE CHOLANGIOPANCREATOGRAPHY (ERCP);  Surgeon: Carol Ada, MD;  Location: [facility];  Service: Endoscopy;  Laterality: N/A;  choledocholithiasis   ESOPHAGOGASTRODUODENOSCOPY (EGD) WITH PROPOFOL N/A 10/26/2020   Procedure: ESOPHAGOGASTRODUODENOSCOPY (EGD) WITH PROPOFOL;  Surgeon: Carol Ada, MD;  Location: [facility];  Service: Endoscopy;  Laterality: N/A;   EUS  10/26/2020   Procedure: UPPER ENDOSCOPIC ULTRASOUND (EUS) LINEAR;   Surgeon: Carol Ada, MD;  Location: [facility] ENDOSCOPY;  Service: Endoscopy;;   IMPLANTABLE CARDIOVERTER DEFIBRILLATOR (ICD) GENERATOR CHANGE Left 12/13/2017   Procedure: PACER CHANGE OUT;  Surgeon: Isaias Cowman, MD;  Location: [facility];  Service: Cardiovascular;  Laterality: Left;   LAPAROSCOPIC CHOLECYSTECTOMY  10/2020   LITHOTRIPSY Bilateral 2012   Xylah Early   LITHOTRIPSY Left 11/2015   x2 Alaysha Jefcoat   pace maker revision  12/13/2017   PACEMAKER PLACEMENT  2009   s/p Medtronic Adapta DR Pacemaker (Dr. Ubaldo Glassing)   REMOVAL OF STONES  10/26/2020   Procedure: REMOVAL OF STONES;  Surgeon: Carol Ada, MD;  Location: [facility];  Service: Endoscopy;;   ROTATOR CUFF REPAIR Left 2009   SPHINCTEROTOMY  10/26/2020   Procedure: [name];  Surgeon: Carol Ada, MD;  Location: [facility] ENDOSCOPY;  Service: Endoscopy;;   [place]  2008   Left    Home Medications:  Allergies as of 09/13/2022       Reactions   Ciprofloxacin Other (See Comments)   PANIC ATTACKS (Fluoroquinolones)   Losartan Other (See Comments)   Muscle pain, difficulty sleeping, anxiety, nausea   Metformin Other (See Comments)   Bad acid reflux   Buprenorphine Hcl Nausea Only   Morphine And Related Nausea Only   Penicillins Rash   Rash Has patient had a PCN reaction causing immediate rash, facial/tongue/throat swelling, SOB or lightheadedness with hypotension: Unknown Has patient had a PCN reaction causing severe rash  involving mucus membranes or skin necrosis: Unknown Has patient had a PCN reaction that required hospitalization: No Has patient had a PCN reaction occurring within the last 10 years: No CHILDHOOD REACTION. If all of the above answers are "NO", then may proceed with Cephalosporin use.        Medication List        Accurate as of September 13, 2022  1:34 PM. If you have any questions, ask your nurse or doctor.          amLODipine 2.5 MG tablet Commonly known as:  NORVASC Take 1 tablet (2.5 mg total) by mouth daily.   aspirin 81 MG tablet Take 81 mg by mouth daily.   atorvastatin 20 MG tablet Commonly known as: LIPITOR Take 1 tablet (20 mg total) by mouth at bedtime.   CertaVite Senior/Antioxidant Tabs Take 1 tablet by mouth daily.   cetirizine 10 MG tablet Commonly known as: ZYRTEC Take 10 mg by mouth at bedtime.   clindamycin 150 MG capsule Commonly known as: CLEOCIN Take 150 mg by mouth as needed. Prior to dental appt   cyclobenzaprine 10 MG tablet Commonly known as: FLEXERIL TAKE ONE-HALF TO ONE TABLET BY MOUTH 2 TIMES DAILY AS NEEDED FOR MUSCLE SPASMS   ferrous sulfate 325 (65 FE) MG EC tablet Take 325 mg by mouth daily with breakfast.   Fish Oil 1000 MG Caps Take by mouth daily.   FLUoxetine 40 MG capsule Commonly known as: PROZAC Take 1 capsule (40 mg total) by mouth daily.   FLUoxetine 20 MG capsule Commonly known as: PROZAC Take 1 capsule (20 mg total) by mouth daily.   fluticasone 50 MCG/ACT nasal spray Commonly known as: FLONASE Place 2 sprays into both nostrils daily.   furosemide 20 MG tablet Commonly known as: LASIX Take 1 tablet (20 mg total) by mouth daily.   Garlic 297 MG Tabs Take by mouth daily.   metFORMIN 500 MG 24 hr tablet Commonly known as: GLUCOPHAGE-XR Take 1 tablet (500 mg total) by mouth daily with breakfast.   naloxone 4 MG/0.1ML Liqd nasal spray kit Commonly known as: NARCAN Place 1 spray into the nose as needed for up to 365 doses (for opioid-induced respiratory depresssion). In case of emergency (overdose), spray once into each nostril. If no response within 3 minutes, repeat application and call [phone].   naproxen 500 MG tablet Commonly known as: NAPROSYN TAKE 1 TABLET pop TWICE DAILY AS NEEDED FOR MODERATE PAIN   omeprazole 20 MG capsule Commonly known as: PRILOSEC Take 1 capsule (20 mg total) by mouth daily.   Oxycodone HCl 10 MG Tabs Take 1 tablet (10 mg total) by mouth every 8  (eight) hours as needed. Each refill must last 30 days.   Oxycodone HCl 10 MG Tabs Take 1 tablet (10 mg total) by mouth every 8 (eight) hours as needed. Each refill must last 30 days.   Oxycodone HCl 10 MG Tabs Take 1 tablet (10 mg total) by mouth every 8 (eight) hours as needed. Each refill must last 30 days. Start taking on: October 05, 2022   potassium citrate 10 MEQ (1080 MG) SR tablet Commonly known as: UROCIT-K Take 1 tablet (10 mEq total) by mouth 2 (two) times daily.   rOPINIRole 2 MG tablet Commonly known as: REQUIP Take 1 tablet (2 mg total) by mouth at bedtime.   SUPER B COMPLEX/VITAMIN C PO Take by mouth daily.   VITAMIN B12 PO Take by mouth daily.   vitamin C  1000 MG tablet Take 1,000 mg by mouth daily.   VITAMIN D3 PO Take 2,000 Int'l Units by mouth daily.        Allergies:  Allergies  Allergen Reactions   Ciprofloxacin Other (See Comments)    PANIC ATTACKS (Fluoroquinolones)    Losartan Other (See Comments)    Muscle pain, difficulty sleeping, anxiety, nausea   Metformin Other (See Comments)    Bad acid reflux   Buprenorphine Hcl Nausea Only   Morphine And Related Nausea Only   Penicillins Rash    Rash Has patient had a PCN reaction causing immediate rash, facial/tongue/throat swelling, SOB or lightheadedness with hypotension: Unknown Has patient had a PCN reaction causing severe rash involving mucus membranes or skin necrosis: Unknown Has patient had a PCN reaction that required hospitalization: No Has patient had a PCN reaction occurring within the last 10 years: No CHILDHOOD REACTION. If all of the above answers are "NO", then may proceed with Cephalosporin use.     Family History: Family History  Problem Relation Age of Onset   Heart disease Mother    Diabetes Mother    Arthritis Mother    Cancer Mother        Breast Cancer   Heart block Father    Heart disease Father    Arthritis Father    Cancer Father        skin cancer     Social History:  reports that he has never smoked. He has never used smokeless tobacco. He reports that he does not drink alcohol and does not use drugs.   Physical Exam: BP 128/79   Pulse 90   Ht 5\' 7"  (1.702 m)   Wt 219 lb (99.3 kg)   BMI 34.30 kg/m   Constitutional:  Alert and oriented, No acute distress. HEENT: [place] AT, moist mucus membranes.  Trachea midline, no masses. Cardiovascular: No clubbing, cyanosis, or edema. Respiratory: Normal respiratory effort, no increased work of breathing. Psychiatric: Normal mood and affect.   Pertinent Imaging: Images from a KUB performed earlier today were personally reviewed and interpreted.  Stable bilateral renal calculi.  No calcifications seen along the expected course of the ureter   Assessment & Plan:    1.  Bilateral nephrolithiasis Asymptomatic without significant interval change Continue potassium citrate Passing sand-like articles which are calcium oxalate Recommend renal ultrasound to make sure there is no evidence of hydronephrosis.  Will call with results Continue annual follow-up ***RUS order  2.  Prostate cancer screening PSA checked annually by Dr. Sandy Salaam, MD  [facility] [address] [place],  [address] [phone]

## 2022-09-14 ENCOUNTER — Encounter: Payer: Self-pay | Admitting: Urology

## 2022-09-26 ENCOUNTER — Ambulatory Visit (HOSPITAL_COMMUNITY)
Admission: RE | Admit: 2022-09-26 | Discharge: 2022-09-26 | Disposition: A | Discharge status: Home / Self Care | Payer: Medicare Other | Source: Ambulatory Visit | Attending: Urology | Admitting: Urology

## 2022-09-26 DIAGNOSIS — N200 Calculus of kidney: Secondary | ICD-10-CM

## 2022-09-26 DIAGNOSIS — Z0389 Encounter for observation for other suspected diseases and conditions ruled out: Secondary | ICD-10-CM | POA: Diagnosis not present

## 2022-09-27 ENCOUNTER — Encounter: Payer: Self-pay | Admitting: *Deleted

## 2022-09-27 ENCOUNTER — Ambulatory Visit: Payer: Medicare Other | Attending: Cardiology | Admitting: Cardiology

## 2022-09-27 ENCOUNTER — Encounter: Payer: Self-pay | Admitting: Cardiology

## 2022-09-27 VITALS — BP 140/60 | HR 67 | Ht 67.0 in | Wt 223.0 lb

## 2022-09-27 DIAGNOSIS — Z950 Presence of cardiac pacemaker: Secondary | ICD-10-CM | POA: Diagnosis not present

## 2022-09-27 DIAGNOSIS — E782 Mixed hyperlipidemia: Secondary | ICD-10-CM | POA: Diagnosis not present

## 2022-09-27 DIAGNOSIS — I10 Essential (primary) hypertension: Secondary | ICD-10-CM | POA: Diagnosis not present

## 2022-09-27 DIAGNOSIS — I495 Sick sinus syndrome: Secondary | ICD-10-CM

## 2022-09-27 NOTE — Telephone Encounter (Signed)
Notified patient as instructed, patient pleased °

## 2022-09-27 NOTE — Telephone Encounter (Signed)
-----   Message from Lafe, Generic sent at 09/27/2022  9:56 AM EST ----- Regarding: Your message may not be read Contact: [phone]    ----- Delivery failure of internet email alert  Tickler type: Message Message Id(WMG): [id number] SMTP Response: [id number] Patient: Mark [name],Mark [name] [id number]) Internet alert email: [email or web address]     ----- Original WMG message to the patient ----- Sent: 09/27/2022  9:54 AM From: Chrystie Nose, CMA To: [place],Travoris K Message Type: User Message Subject: Results Good morning, Renal ultrasound showed no evidence of kidney obstruction/blockage

## 2022-09-27 NOTE — Patient Instructions (Addendum)
Medication Instructions:  Your physician recommends that you continue on your current medications as directed. Please refer to the Current Medication list given to you today.   Labwork: None  Testing/Procedures: None  Follow-Up: Follow up with Dr. Domenic Polite in 1 year.  Any Other Special Instructions Will Be Listed Below (If Applicable).  You have been referred to EP.    If you need a refill on your cardiac medications before your next appointment, please call your pharmacy.

## 2022-09-27 NOTE — Progress Notes (Signed)
Cardiology Office Note  Date: 09/27/2022   ID: Mark [name], DOB May 25, 1956, MRN [id number]  PCP:  Ria Bush, MD  Cardiologist:  Rozann Lesches, MD Electrophysiologist:  None   Chief Complaint  Patient presents with   Establish cardiology follow-up    History of Present Illness: Mark [name] is a 67 y.o. male referred for cardiology consultation by Dr. Danise Mina to establish cardiac follow-up.  I reviewed his records, he has been seen in the [facility] in [place] for several years, most recently by Drs. Fath and Paraschos.  He has a history of sinus node dysfunction with symptomatic bradycardia status post Medtronic pacemaker implantation in 2009 and generator change in 2019.  He is asymptomatic, no dizziness, no palpitations.  He has no history of atrial arrhythmia.  I reviewed his medications which are noted below.  He did bring in home blood pressure recordings, has been tolerating Norvasc under the direction of his PCP.  I personally reviewed his ECG today which shows an atrial paced rhythm.  I went over his lab work within the last 6 months.  He has been having his Medtronic pacemaker interrogated from home without difficulty.  He did ask about SBE prophylaxis, apparently Dr. Ubaldo Glassing had been prescribing clindamycin prior to routine dental procedures.  Based on current guidelines he does not require antibiotic prophylaxis with history of pacemaker implantation.  He states that he does not have any prior history of endocarditis.  Past Medical History:  Diagnosis Date   Anxiety    Arthritis    Chronic pain    Depression    Heart murmur    History of kidney stones 11/17/2015   Removed two kidney stones about 1 cm in size   Hydrocele in adult 05/07/2015   Hypertension    Kidney stones    Migraine    MRSA carrier 2019   Presence of permanent cardiac pacemaker    Medtronic implanted 2009 for sinus node dysfunction: generator change 2019   Prostatitis     Sleep apnea    Does not wear CPAP    Past Surgical History:  Procedure Laterality Date   CARDIOVASCULAR STRESS TEST  2014   Dr Ubaldo Glassing   CARPAL TUNNEL RELEASE Bilateral 2006   [place]  2008   C5/6   CHOLECYSTECTOMY N/A 10/27/2020   Procedure: LAPAROSCOPIC CHOLECYSTECTOMY;  Surgeon: Georganna Skeans, MD;  Location: [place];  Service: General;  Laterality: N/A;   COLONOSCOPY  07/2007   prostate nodule, hemorrhoid, rpt 5 yrs [name])   ERCP N/A 10/26/2020   Procedure: ENDOSCOPIC RETROGRADE CHOLANGIOPANCREATOGRAPHY (ERCP);  Surgeon: Carol Ada, MD;  Location: [place];  Service: Endoscopy;  Laterality: N/A;  choledocholithiasis   ESOPHAGOGASTRODUODENOSCOPY (EGD) WITH PROPOFOL N/A 10/26/2020   Procedure: ESOPHAGOGASTRODUODENOSCOPY (EGD) WITH PROPOFOL;  Surgeon: Carol Ada, MD;  Location: [place];  Service: Endoscopy;  Laterality: N/A;   EUS  10/26/2020   Procedure: UPPER ENDOSCOPIC ULTRASOUND (EUS) LINEAR;  Surgeon: Carol Ada, MD;  Location[facility] ENDOSCOPY;  Service: Endoscopy;;   IMPLANTABLE CARDIOVERTER DEFIBRILLATOR (ICD) GENERATOR CHANGE Left 12/13/2017   Procedure: PACER CHANGE OUT;  Surgeon: Isaias Cowman, MD;  Location: [facility];  Service: Cardiovascular;  Laterality: Left;   LAPAROSCOPIC CHOLECYSTECTOMY  10/2020   LITHOTRIPSY Bilateral 2012   [place]   LITHOTRIPSY Left 11/2015   x2 Stoioff   pace maker revision  12/13/2017   PACEMAKER PLACEMENT  2009   s/p Medtronic Adapta DR Pacemaker (Dr. Ubaldo Glassing)   [place]  10/26/2020   Procedure: REMOVAL OF STONES;  Surgeon: Carol Ada, MD;  [facility] ENDOSCOPY;  Service: Endoscopy;;   ROTATOR CUFF REPAIR Left 2009   SPHINCTEROTOMY  10/26/2020   Procedure: SPHINCTEROTOMY;  Surgeon: Carol Ada, MD;  Location: [place];  Service: Endoscopy;;   [place]  2008   Left    Current Outpatient Medications  Medication Sig Dispense Refill   amLODipine (NORVASC) 2.5 MG  tablet Take 1 tablet (2.5 mg total) by mouth daily. 90 tablet 3   Ascorbic Acid (VITAMIN C) 1000 MG tablet Take 1,000 mg by mouth daily.     aspirin 81 MG tablet Take 81 mg by mouth daily.     atorvastatin (LIPITOR) 20 MG tablet Take 1 tablet (20 mg total) by mouth at bedtime. 90 tablet 1   B Complex-C (SUPER B COMPLEX/VITAMIN C PO) Take by mouth daily.     cetirizine (ZYRTEC) 10 MG tablet Take 10 mg by mouth at bedtime.     Cholecalciferol (VITAMIN D3 PO) Take 2,000 Int'l Units by mouth daily.     clindamycin (CLEOCIN) 150 MG capsule Take 150 mg by mouth as needed. Prior to dental appt     Cyanocobalamin (VITAMIN B12 PO) Take by mouth daily.     cyclobenzaprine (FLEXERIL) 10 MG tablet TAKE ONE-HALF TO ONE TABLET BY MOUTH 2 TIMES DAILY AS NEEDED FOR MUSCLE SPASMS 40 tablet 0   ferrous sulfate 325 (65 FE) MG EC tablet Take 325 mg by mouth daily with breakfast.     FLUoxetine (PROZAC) 20 MG capsule Take 1 capsule (20 mg total) by mouth daily. 90 capsule 3   FLUoxetine (PROZAC) 40 MG capsule Take 1 capsule (40 mg total) by mouth daily. 90 capsule 3   fluticasone (FLONASE) 50 MCG/ACT nasal spray Place 2 sprays into both nostrils daily. 48 g 1   furosemide (LASIX) 20 MG tablet Take 1 tablet (20 mg total) by mouth daily. 90 tablet 4   Garlic 123XX123 MG TABS Take by mouth daily.     metFORMIN (GLUCOPHAGE-XR) 500 MG 24 hr tablet Take 1 tablet (500 mg total) by mouth daily with breakfast. 90 tablet 2   Multiple Vitamins-Minerals (CERTAVITE SENIOR/ANTIOXIDANT) TABS Take 1 tablet by mouth daily.     naloxone (NARCAN) nasal spray 4 mg/0.1 mL Place 1 spray into the nose as needed for up to 365 doses (for opioid-induced respiratory depresssion). In case of emergency (overdose), spray once into each nostril. If no response within 3 minutes, repeat application and call [id number]. 1 each 0   naproxen (NAPROSYN) 500 MG tablet TAKE 1 TABLET pop TWICE DAILY AS NEEDED FOR MODERATE PAIN (Patient taking differently: daily.) 90  tablet 0   Omega-3 Fatty Acids (FISH OIL) 1000 MG CAPS Take by mouth daily.     omeprazole (PRILOSEC) 20 MG capsule Take 1 capsule (20 mg total) by mouth daily. 90 capsule 3   Oxycodone HCl 10 MG TABS Take 1 tablet (10 mg total) by mouth every 8 (eight) hours as needed. Each refill must last 30 days. 90 tablet 0   Oxycodone HCl 10 MG TABS Take 1 tablet (10 mg total) by mouth every 8 (eight) hours as needed. Each refill must last 30 days. 90 tablet 0   [START ON 10/05/2022] Oxycodone HCl 10 MG TABS Take 1 tablet (10 mg total) by mouth every 8 (eight) hours as needed. Each refill must last 30 days. 90 tablet 0   potassium citrate (  UROCIT-K) 10 MEQ (1080 MG) SR tablet Take 1 tablet (10 mEq total) by mouth 2 (two) times daily. 180 tablet 3   rOPINIRole (REQUIP) 2 MG tablet Take 1 tablet (2 mg total) by mouth at bedtime. 90 tablet 3   No current facility-administered medications for this visit.   Allergies:  Ciprofloxacin, Losartan, Metformin, Buprenorphine hcl, Morphine and related, and Penicillins   Social History: The patient  reports that he has never smoked. He has never used smokeless tobacco. He reports that he does not drink alcohol and does not use drugs.   Family History: The patient's family history includes Arthritis in his father and mother; Cancer in his father and mother; Diabetes in his mother; Heart block in his father; Heart disease in his father and mother.   ROS: No palpitations or syncope.  Physical Exam: VS:  BP (!) 140/60   Pulse 67   Ht 5' 7"$  (1.702 m)   Wt 223 lb (101.2 kg)   SpO2 98%   BMI 34.93 kg/m , BMI Body mass index is 34.93 kg/m.  Wt Readings from Last 3 Encounters:  09/27/22 223 lb (101.2 kg)  09/13/22 219 lb (99.3 kg)  08/28/22 222 lb 8 oz (100.9 kg)    General: Patient appears comfortable at rest. HEENT: Conjunctiva and lids normal. Neck: Supple, no elevated JVP or carotid bruits. Lungs: Clear to auscultation, nonlabored breathing at rest. Cardiac:  Regular rate and rhythm, no S3 or significant systolic murmur. Abdomen: Soft, nontender, bowel sounds present. Extremities: No pitting edema, distal pulses 2+. Skin: Warm and dry. Musculoskeletal: No kyphosis. Neuropsychiatric: Alert and oriented x3, affect grossly appropriate.  ECG:   No recent tracings for direct review today.  Recent Labwork: 10/05/2021: ALT 28; AST 24 12/12/2021: BUN 20; Creatinine, Ser 0.90; Hemoglobin 14.5; Platelets 177.0; Potassium 3.8; Sodium 138; TSH 4.26     Component Value Date/Time   CHOL 129 12/12/2021 0907   TRIG 222.0 (H) 12/12/2021 0907   HDL 41.80 12/12/2021 0907   CHOLHDL 3 12/12/2021 0907   VLDL 44.4 (H) 12/12/2021 0907   LDLCALC 46 09/07/2020 1316   LDLDIRECT 64.0 12/12/2021 0907    Other Studies Reviewed Today:  Echocardiogram 10/11/2017: - Left ventricle: The cavity size was normal. There was mild    concentric hypertrophy. Systolic function was normal. The    estimated ejection fraction was in the range of 55% to 65%.  - Aortic valve: Valve area (Vmax): 2.67 cm^2.   Assessment and Plan:  1.  Sinus node dysfunction with history of symptomatic bradycardia status post Medtronic pacemaker as discussed above.  He is clinically stable, ECG today shows an atrial paced rhythm.  We will get him set up in our EP clinic with Dr. Lovena Le for device interrogation going forward.  2.  Essential hypertension, followed by PCP and currently on Norvasc.  I did not make any changes today.  3.  Mixed hyperlipidemia, on Lipitor.  LDL 64 in May of last year.  Medication Adjustments/Labs and Tests Ordered: Current medicines are reviewed at length with the patient today.  Concerns regarding medicines are outlined above.   Tests Ordered: Orders Placed This Encounter  Procedures   Ambulatory referral to Cardiac Electrophysiology   EKG 12-Lead    Medication Changes: No orders of the defined types were placed in this encounter.   Disposition:  Follow up  1  year.  Signed, Satira Sark, MD, [facility] 09/27/2022 1:54 PM    [place] [facility]  [place] [address] [place], [place] [address] Phone: [phone]; Fax: [phone]

## 2022-10-03 ENCOUNTER — Telehealth: Payer: Self-pay | Admitting: Family Medicine

## 2022-10-03 NOTE — Telephone Encounter (Signed)
Contacted Mark [name] to schedule their annual wellness visit. Appointment made for 11/02/2022.  [place] Direct Dial: [phone]

## 2022-10-08 ENCOUNTER — Other Ambulatory Visit: Payer: Self-pay | Admitting: Family Medicine

## 2022-10-08 DIAGNOSIS — G894 Chronic pain syndrome: Secondary | ICD-10-CM

## 2022-10-09 NOTE — Telephone Encounter (Signed)
Refill request Naprosyn Last refill 07/09/22 #90 Last office visit 08/28/22

## 2022-10-11 NOTE — Telephone Encounter (Signed)
ERx 

## 2022-10-27 ENCOUNTER — Other Ambulatory Visit: Payer: Self-pay | Admitting: Family Medicine

## 2022-10-27 DIAGNOSIS — I10 Essential (primary) hypertension: Secondary | ICD-10-CM

## 2022-11-01 ENCOUNTER — Ambulatory Visit: Payer: Medicare Other | Attending: Pain Medicine | Admitting: Pain Medicine

## 2022-11-01 ENCOUNTER — Encounter: Payer: Self-pay | Admitting: Pain Medicine

## 2022-11-01 VITALS — BP 129/78 | HR 86 | Temp 98.1°F | Resp 16 | Ht 67.0 in | Wt 220.0 lb

## 2022-11-01 DIAGNOSIS — M533 Sacrococcygeal disorders, not elsewhere classified: Secondary | ICD-10-CM | POA: Diagnosis not present

## 2022-11-01 DIAGNOSIS — M542 Cervicalgia: Secondary | ICD-10-CM | POA: Insufficient documentation

## 2022-11-01 DIAGNOSIS — M25511 Pain in right shoulder: Secondary | ICD-10-CM | POA: Insufficient documentation

## 2022-11-01 DIAGNOSIS — G8929 Other chronic pain: Secondary | ICD-10-CM | POA: Diagnosis not present

## 2022-11-01 DIAGNOSIS — M5412 Radiculopathy, cervical region: Secondary | ICD-10-CM | POA: Diagnosis not present

## 2022-11-01 DIAGNOSIS — M47816 Spondylosis without myelopathy or radiculopathy, lumbar region: Secondary | ICD-10-CM

## 2022-11-01 DIAGNOSIS — G894 Chronic pain syndrome: Secondary | ICD-10-CM | POA: Insufficient documentation

## 2022-11-01 DIAGNOSIS — M25512 Pain in left shoulder: Secondary | ICD-10-CM | POA: Diagnosis not present

## 2022-11-01 DIAGNOSIS — G4486 Cervicogenic headache: Secondary | ICD-10-CM | POA: Insufficient documentation

## 2022-11-01 DIAGNOSIS — Z79899 Other long term (current) drug therapy: Secondary | ICD-10-CM

## 2022-11-01 DIAGNOSIS — M961 Postlaminectomy syndrome, not elsewhere classified: Secondary | ICD-10-CM | POA: Insufficient documentation

## 2022-11-01 DIAGNOSIS — Z79891 Long term (current) use of opiate analgesic: Secondary | ICD-10-CM | POA: Insufficient documentation

## 2022-11-01 DIAGNOSIS — M545 Low back pain, unspecified: Secondary | ICD-10-CM | POA: Insufficient documentation

## 2022-11-01 MED ORDER — OXYCODONE HCL 10 MG PO TABS: 10.0000 mg | ORAL_TABLET | Freq: Three times a day (TID) | ORAL | 0 refills | Status: DC | PRN

## 2022-11-01 NOTE — Progress Notes (Signed)
Nursing Pain Medication Assessment:  Safety precautions to be maintained throughout the outpatient stay will include: orient to surroundings, keep bed in low position, maintain call bell within reach at all times, provide assistance with transfer out of bed and ambulation.  Medication Inspection Compliance: Pill count conducted under aseptic conditions, in front of the patient. Neither the pills nor the bottle was removed from the patient's sight at any time. Once count was completed pills were immediately returned to the patient in their original bottle.  Medication: Oxycodone IR Pill/Patch Count:  72 of 90 pills remain Pill/Patch Appearance: Markings consistent with prescribed medication Bottle Appearance: Standard pharmacy container. Clearly labeled. Filled Date: 03 / 21 / 2024 Last Medication intake:  Today

## 2022-11-01 NOTE — Progress Notes (Signed)
PROVIDER NOTE: Information contained herein reflects review and annotations entered in association with encounter. Interpretation of such information and data should be left to medically-trained personnel. Information provided to patient can be located elsewhere in the medical record under "Patient Instructions". Document created using STT-dictation technology, any transcriptional errors that may result from process are unintentional.    Patient: Mark [name]  Service Category: E/M  Provider: Gaspar Cola, MD  DOB: [birth year]  DOS: 11/01/2022  Referring Provider: Ria Bush, MD  MRN: [id number]  Specialty: Interventional Pain Management  PCP: Ria Bush, MD  Type: Established Patient  Setting: Ambulatory outpatient    Location: Office  Delivery: Face-to-face     HPI  [name], a 67 y.o. year old male, is here today because of his Chronic pain syndrome [G89.4]. 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) (Left); Chronic sacroiliac joint pain (Bilateral); Osteoarthritis of sacroiliac joint (Bilateral); Lumbar facet hypertrophy (L1-2, L2-3, and L4-5) (Bilateral); Lumbar IVDD (intervertebral disc displacement); 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); Lumbar facet syndrome (Bilateral); Osteoarthritis of shoulder (Left); Osteoarthritis of knee (Bilateral) (R>L); Chronic cervical radicular pain; Chronic fatigue; RLS (restless legs syndrome); Trigger finger of index finger (Right); Cervicalgia; Trigger finger of thumb  (Right); Chronic thumb pain (Left); Trigger middle finger of right hand; Chronic hand pain (Left); Osteoarthritis of first carpometacarpal joint (thumb) of hand (Left); Trigger point with back pain (Left); Trigger point of shoulder region (scapula) (Left); History of laparoscopic cholecystectomy (10/27/2020); DDD (degenerative disc disease), cervical; Chronic low back pain (Bilateral) w/o sciatica; Chronic shoulder pain (Bilateral) (L>R); and Chronic shoulder pain (Left) on their pertinent problem list. Pain Assessment: Severity of Chronic pain is reported as a 3 /10. Location: Neck Left/radiates into left shoulder. Onset: More than a month ago. Quality: Aching, Dull. Timing: Constant. Modifying factor(s): meds, rest. Vitals:  height is 5\' 7"  (1.702 m) and weight is 220 lb (99.8 kg). His temperature is 98.1 F (36.7 C). His blood pressure is 129/78 and his pulse is 86. His respiration is 16 and oxygen saturation is 98%.  BMI: Estimated body mass index is 34.46 kg/m as calculated from the following:   Height as of this encounter: 5\' 7"  (1.702 m).   Weight as of this encounter: 220 lb (99.8 kg). Last encounter: 07/24/2022. Last procedure: Visit date not found.  Reason for encounter: medication management.  The patient indicates doing well with the current medication regimen. No adverse reactions or side effects reported to the medications.   RTCB: 6/28/202   Pharmacotherapy Assessment  Analgesic: Oxycodone IR 10 mg, 1 tab PO TID (30 mg/day of oxycodone) MME/day: 45 mg/day.   Monitoring: [facility] PMP: PDMP reviewed during this encounter.       Pharmacotherapy: No side-effects or adverse reactions reported. Compliance: No problems identified. Effectiveness: Clinically acceptable.  Dewayne Shorter, RN  11/01/2022  2:30 PM  Sign when Signing Visit Nursing Pain Medication Assessment:  Safety precautions to be maintained throughout the outpatient stay will include: orient to surroundings, keep bed in low  position, maintain call bell within reach at all times, provide assistance with transfer out of bed and ambulation.  Medication Inspection Compliance: Pill count conducted under aseptic conditions, in front of the patient. Neither the pills nor the bottle was removed from the patient's sight at any time. Once count was completed pills were immediately returned to the patient in their original bottle.  Medication: Oxycodone IR Pill/Patch Count:  72 of 90 pills remain Pill/Patch Appearance: Markings consistent with prescribed medication Bottle Appearance: Standard pharmacy container. Clearly labeled. Filled Date: 03 / 21 / 2024 Last Medication intake:  Today    No results found for: "CBDTHCR" No results found for: "D8THCCBX" No results found for: "D9THCCBX"  UDS:  Summary  Date Value Ref Range Status  12/12/2021 Note  Final    Comment:    ==================================================================== [employer] Select 13 (MW) ==================================================================== Test                             Result       Flag       Units  Drug Present and Declared for Prescription Verification   Oxycodone                      4111         EXPECTED   ng/mg creat   Noroxycodone                   6306         EXPECTED   ng/mg creat    Sources of oxycodone include scheduled prescription medications.    Noroxycodone is an expected metabolite of oxycodone.  ==================================================================== Test                      Result    Flag   Units      Ref Range   Creatinine              54               mg/dL      >=20 ==================================================================== Declared Medications:  The flagging and interpretation on this report are based on the  following declared medications.  Unexpected results may arise from  inaccuracies in the declared medications.   **Note: The testing scope of this panel includes these  medications:   Oxycodone   **Note: The testing scope of this panel does not include the  following reported medications:   Amlodipine (Norvasc)  Aspirin  Atorvastatin (Lipitor)  Cetirizine (Zyrtec)  Clindamycin (Cleocin)  Cranberry  Cyclobenzaprine (Flexeril)  Fluoxetine (Prozac)  Fluticasone (Flonase)  Furosemide (Lasix)  Iron  Multivitamin  Naloxone (Narcan)  Naproxen (Naprosyn)  Omeprazole (Prilosec)  Potassium  Ropinirole (Requip)  Supplement  Vitamin C  Vitamin D ==================================================================== For clinical consultation, please call [phone]. ====================================================================       ROS  Constitutional: Denies any fever or chills Gastrointestinal: No reported hemesis, hematochezia, vomiting, or acute GI distress Musculoskeletal: Denies any acute onset joint swelling, redness, loss of ROM, or weakness Neurological: No reported episodes of acute onset apraxia, aphasia, dysarthria, agnosia, amnesia, paralysis, loss of coordination, or loss of consciousness  Medication Review  B Complex-C, CertaVite Senior/Antioxidant, Cholecalciferol, Cyanocobalamin, FLUoxetine, Fish Oil, Garlic, Oxycodone HCl, amLODipine, aspirin, atorvastatin, cetirizine, cyclobenzaprine, ferrous sulfate, fluticasone, furosemide, metFORMIN, naloxone, naproxen, omeprazole, potassium citrate, rOPINIRole, and vitamin C  History Review  Allergy: Mark [name] is allergic to ciprofloxacin, losartan, metformin, buprenorphine hcl, morphine and related, and penicillins. Drug: Mark [name]  reports no history of drug use.  Alcohol:  reports no history of alcohol use. Tobacco:  reports that he has never smoked. He has never used smokeless tobacco. Social: Mark [name]  reports that he has never smoked. He has never used smokeless tobacco. He reports that he does not drink alcohol and does not use drugs. Medical:  has a past medical history of  Anxiety, Arthritis, Chronic pain, Depression, Heart murmur, History of kidney stones (11/17/2015), Hydrocele in adult (05/07/2015), Hypertension, Kidney stones, Migraine, MRSA carrier (2019), Presence of permanent cardiac pacemaker, Prostatitis, and Sleep apnea. Surgical: Mark [name]  has a past surgical history that includes Tonsillectomy (1964); Carpal tunnel release (Bilateral, 2006); Ulnar nerve repair (2008); Cervical spine surgery (2008); Rotator cuff repair (Left, 2009); pacemaker placement (2009); Lithotripsy (Bilateral, 2012); Lithotripsy (Left, 11/2015); Cardiovascular stress test (2014); Colonoscopy (07/2007); implantable cardioverter defibrillator (icd) generator change (Left, 12/13/2017); pace maker revision (12/13/2017); Laparoscopic cholecystectomy (10/2020); Cholecystectomy (N/A, 10/27/2020); ERCP (N/A, 10/26/2020); Esophagogastroduodenoscopy (egd) with propofol (N/A, 10/26/2020); EUS (10/26/2020); Sphincterotomy (10/26/2020); and removal of stones (10/26/2020). Family: family history includes Arthritis in his father and mother; Cancer in his father and mother; Diabetes in his mother; Heart block in his father; Heart disease in his father and mother.  Laboratory Chemistry Profile   Renal Lab Results  Component Value Date   BUN 20 12/12/2021   CREATININE 0.90 12/12/2021   GFR 89.99 12/12/2021   GFRAA >60 12/06/2017   GFRNONAA >60 10/29/2020    Hepatic Lab Results  Component Value Date   AST 24 10/05/2021   ALT 28 10/05/2021   ALBUMIN 4.1 10/05/2021   ALKPHOS 67 10/05/2021   HCVAB NEGATIVE 07/22/2013   LIPASE 39 10/28/2020    Electrolytes Lab Results  Component Value Date   NA 138 12/12/2021   K 3.8 12/12/2021   CL 100 12/12/2021   CALCIUM 9.1 12/12/2021   MG 2.1 09/01/2015    Bone Lab Results  Component Value Date   25OHVITD1 63 02/02/2016   25OHVITD2 <1.0 02/02/2016   25OHVITD3 63 02/02/2016   TESTOSTERONE 495 08/08/2017    Inflammation (CRP: Acute Phase) (ESR: Chronic  Phase) Lab Results  Component Value Date   CRP 1.7 (H) 09/01/2015   ESRSEDRATE 6 09/01/2015         Note: Above Lab results reviewed.  Recent Imaging Review  Ultrasound renal complete CLINICAL DATA:  Nephrolithiasis.  EXAM: RENAL / URINARY TRACT ULTRASOUND COMPLETE  COMPARISON:  None Available.  FINDINGS: Right Kidney:  Renal measurements: 11.6 x 6.4 x 6.2 cm = volume: 238 mL. Contains 2 probable stones with the largest measuring 7.8 mm. No hydronephrosis.  Left Kidney:  Renal measurements: 9.6 x 5.3 x 4.9 cm = volume: 130 mL. Contains 2 probable stones with the largest measuring 11.9 mm. No hydronephrosis.  Bladder:  Appears normal for degree of bladder distention.  Other:  None.  IMPRESSION: Probable bilateral renal stones. No hydronephrosis. No other abnormalities.  Electronically Signed   By: Dorise Bullion III M.D.   On: 09/26/2022 19:49 Note: Reviewed        Physical Exam  General appearance: Well nourished, well developed, and well hydrated. In no apparent acute distress Mental status: Alert, oriented x 3 (person, place, & time)       Respiratory: No evidence of acute respiratory distress Eyes: PERLA Vitals: BP 129/78   Pulse 86   Temp 98.1 F (36.7 C)   Resp 16   Ht 5\' 7"  (1.702 m)   Wt 220 lb (99.8 kg)   SpO2 98%  BMI 34.46 kg/m  BMI: Estimated body mass index is 34.46 kg/m as calculated from the following:   Height as of this encounter: 5\' 7"  (1.702 m).   Weight as of this encounter: 220 lb (99.8 kg). Ideal: Ideal body weight: 66.1 kg (145 lb 11.6 oz) Adjusted ideal body weight: 79.6 kg (175 lb 6.9 oz)  Assessment   Diagnosis Status  1. Chronic pain syndrome   2. Chronic neck pain (1ry area of Pain) (Bilateral) (L>R)   3. Cervicalgia   4. Chronic sacroiliac joint pain (Bilateral)   5. Cervicogenic headache   6. 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 Lumbar facet syndrome (Bilateral)   8. Chronic  shoulder pain (Bilateral) (L>R)   9. Chronic cervical radicular pain   10. Chronic low back pain (Bilateral) w/o sciatica   11. Pharmacologic therapy   12. Chronic use of opiate for therapeutic purpose   13. Encounter for medication management   14. Encounter for chronic pain management    Controlled Controlled Controlled   Updated Problems: No problems updated.  Plan of Care  Problem-specific:  No problem-specific Assessment & Plan notes found for this encounter.  Mark [name] has a current medication list which includes the following long-term medication(s): amlodipine, atorvastatin, cetirizine, fluoxetine, fluoxetine, fluticasone, furosemide, metformin, omeprazole, [START ON 11/04/2022] oxycodone hcl, [START ON 12/04/2022] oxycodone hcl, [START ON 01/03/2023] oxycodone hcl, and ropinirole.  Pharmacotherapy (Medications Ordered): Meds ordered this encounter  Medications   Oxycodone HCl 10 MG TABS    Sig: Take 1 tablet (10 mg total) by mouth every 8 (eight) hours as needed. Each refill must last 30 days.    Dispense:  90 tablet    Refill:  0    DO NOT: delete (not duplicate); no partial-fill (will deny script to complete), no refill request (F/U required). DISPENSE: 1 day early if closed on fill date. WARN: No CNS-depressants within 8 hrs of med.   Oxycodone HCl 10 MG TABS    Sig: Take 1 tablet (10 mg total) by mouth every 8 (eight) hours as needed. Each refill must last 30 days.    Dispense:  90 tablet    Refill:  0    DO NOT: delete (not duplicate); no partial-fill (will deny script to complete), no refill request (F/U required). DISPENSE: 1 day early if closed on fill date. WARN: No CNS-depressants within 8 hrs of med.   Oxycodone HCl 10 MG TABS    Sig: Take 1 tablet (10 mg total) by mouth every 8 (eight) hours as needed. Each refill must last 30 days.    Dispense:  90 tablet    Refill:  0    DO NOT: delete (not duplicate); no partial-fill (will deny script to complete),  no refill request (F/U required). DISPENSE: 1 day early if closed on fill date. WARN: No CNS-depressants within 8 hrs of med.   Orders:  No orders of the defined types were placed in this encounter.  Follow-up plan:   Return in about 3 months (around 02/02/2023) for Eval-day (M,W), (F2F), (MM).      Interventional Therapies  Risk  Complexity Considerations:   WNL   Planned  Pending:   Diagnostic left IA shoulder joint injection #1 vs left suprascapular nerve block #1    Under consideration:   Diagnostic right hand digit 2 trigger finger injection Therapeutic bilateral IA Hyalgan knee injections  Diagnostic bilateral cervical facet block  Possible bilateral cervical facet RFA  Diagnostic left sided CESI  Diagnostic bilateral IA knee injection  Diagnostic bilateral genicular NB  Possible bilateral genicular nerve RFA  Diagnostic bilateral sacroiliac joint block  Possible bilateral sacroiliac joint RFA  Diagnostic bilateral IA shoulder joint injection  Diagnostic bilateral suprascapular NB  Possible bilateral suprascapular nerve RFA  Diagnostic bilateral lumbar facet block  Possible bilateral lumbar facet RFA  Diagnostic left L4-5 LESI    Completed:   Therapeutic left levator scapula, supraspinatus, and rhomboid muscle TPI/MNB x1 (03/30/2020) (100/100/85/75)  Diagnostic left C7-T1 cervical ESI x1 (06/21/2016) (90/90/70/75)  Diagnostic bilateral IA Hyalgan knee inj. x3 (06/21/2016) (left: 100/90/100/90/75) (right: 90/90/80/75)  Diagnostic/therapeutic right thumb inj. x1 (05/23/2016) (n/a) Diagnostic/therapeutic left thumb (CMC-Trapezium) inj. x1 (03/11/2020) (100/100/75/75)    Therapeutic  Palliative (PRN) options:   Therapeutic IA Hyalgan knee injections       Recent Visits No visits were found meeting these conditions. Showing recent visits within past 90 days and meeting all other requirements Today's Visits Date Type Provider Dept  11/01/22 Office Visit Milinda Pointer, MD [facility]  Showing today's visits and meeting all other requirements Future Appointments Date Type Provider Dept  01/29/23 Appointment Milinda Pointer, MD [facility]  Showing future appointments within next 90 days and meeting all other requirements  I discussed the assessment and treatment plan with the patient. The patient was provided an opportunity to ask questions and all were answered. The patient agreed with the plan and demonstrated an understanding of the instructions.  Patient advised to call back or seek an in-person evaluation if the symptoms or condition worsens.  Duration of encounter: 30 minutes.  Total time on encounter, as per AMA guidelines included both the face-to-face and non-face-to-face time personally spent by the physician and/or other qualified health care professional(s) on the day of the encounter (includes time in activities that require the physician or other qualified health care professional and does not include time in activities normally performed by clinical staff). Physician's time may include the following activities when performed: Preparing to see the patient (e.g., pre-charting review of records, searching for previously ordered imaging, lab work, and nerve conduction tests) Review of prior analgesic pharmacotherapies. Reviewing PMP Interpreting ordered tests (e.g., lab work, imaging, nerve conduction tests) Performing post-procedure evaluations, including interpretation of diagnostic procedures Obtaining and/or reviewing separately obtained history Performing a medically appropriate examination and/or evaluation Counseling and educating the patient/family/caregiver Ordering medications, tests, or procedures Referring and communicating with other health care professionals (when not separately reported) Documenting clinical information in the electronic or other health record Independently interpreting results (not  separately reported) and communicating results to the patient/ family/caregiver Care coordination (not separately reported)  Note by: Gaspar Cola, MD Date: 11/01/2022; Time: 3:49 PM

## 2022-11-01 NOTE — Patient Instructions (Signed)
____________________________________________________________________________________________  Opioid Pain Medication Update  To: All patients taking opioid pain medications. (I.e.: hydrocodone, hydromorphone, oxycodone, oxymorphone, morphine, codeine, methadone, tapentadol, tramadol, buprenorphine, fentanyl, etc.)  Re: Updated review of side effects and adverse reactions of opioid analgesics, as well as new information about long term effects of this class of medications.  Direct risks of long-term opioid therapy are not limited to opioid addiction and overdose. Potential medical risks include serious fractures, breathing problems during sleep, hyperalgesia, immunosuppression, chronic constipation, bowel obstruction, myocardial infarction, and tooth decay secondary to xerostomia.  Unpredictable adverse effects that can occur even if you take your medication correctly: Cognitive impairment, respiratory depression, and death. Most people think that if they take their medication "correctly", and "as instructed", that they will be safe. Nothing could be farther from the truth. In reality, a significant amount of recorded deaths associated with the use of opioids has occurred in individuals that had taken the medication for a long time, and were taking their medication correctly. The following are examples of how this can happen: Patient taking his/her medication for a long time, as instructed, without any side effects, is given a certain antibiotic or another unrelated medication, which in turn triggers a "Drug-to-drug interaction" leading to disorientation, cognitive impairment, impaired reflexes, respiratory depression or an untoward event leading to serious bodily harm or injury, including death.  Patient taking his/her medication for a long time, as instructed, without any side effects, develops an acute impairment of liver and/or kidney function. This will lead to a rapid inability of the body to  breakdown and eliminate their pain medication, which will result in effects similar to an "overdose", but with the same medicine and dose that they had always taken. This again may lead to disorientation, cognitive impairment, impaired reflexes, respiratory depression or an untoward event leading to serious bodily harm or injury, including death.  A similar problem will occur with patients as they grow older and their liver and kidney function begins to decrease as part of the aging process.  Background information: Historically, the original case for using long-term opioid therapy to treat chronic noncancer pain was based on safety assumptions that subsequent experience has called into question. In 1996, the American Pain Society and the [place] [employer] issued a consensus statement supporting long-term opioid therapy. This statement acknowledged the dangers of opioid prescribing but concluded that the risk for addiction was low; respiratory depression induced by opioids was short-lived, occurred mainly in opioid-naive patients, and was antagonized by pain; tolerance was not a common problem; and efforts to control diversion should not constrain opioid prescribing. This has now proven to be wrong. Experience regarding the risks for opioid addiction, misuse, and overdose in community practice has failed to support these assumptions.  According to the Centers for Disease Control and Prevention, fatal overdoses involving opioid analgesics have increased sharply over the past decade. Currently, more than 96,700 people die from drug overdoses every year. Opioids are a factor in 7 out of every 10 overdose deaths. Deaths from drug overdose have surpassed motor vehicle accidents as the leading cause of death for individuals between the ages of 3 and 36.  Clinical data suggest that neuroendocrine dysfunction may be very common in both men and women, potentially causing hypogonadism, erectile  dysfunction, infertility, decreased libido, osteoporosis, and depression. Recent studies linked higher opioid dose to increased opioid-related mortality. Controlled observational studies reported that long-term opioid therapy may be associated with increased risk for cardiovascular events. Subsequent meta-analysis concluded  that the safety of long-term opioid therapy in elderly patients has not been proven.   Side Effects and adverse reactions: Common side effects: Drowsiness (sedation). Dizziness. Nausea and vomiting. Constipation. Physical dependence -- Dependence often manifests with withdrawal symptoms when opioids are discontinued or decreased. Tolerance -- As you take repeated doses of opioids, you require increased medication to experience the same effect of pain relief. Respiratory depression -- This can occur in healthy people, especially with higher doses. However, people with COPD, asthma or other lung conditions may be even more susceptible to fatal respiratory impairment.  Uncommon side effects: An increased sensitivity to feeling pain and extreme response to pain (hyperalgesia). Chronic use of opioids can lead to this. Delayed gastric emptying (the process by which the contents of your stomach are moved into your small intestine). Muscle rigidity. Immune system and hormonal dysfunction. Quick, involuntary muscle jerks (myoclonus). Arrhythmia. Itchy skin (pruritus). Dry mouth (xerostomia).  Long-term side effects: Chronic constipation. Sleep-disordered breathing (SDB). Increased risk of bone fractures. Hypothalamic-pituitary-adrenal dysregulation. Increased risk of overdose.  RISKS: Fractures and Falls:  Opioids increase the risk and incidence of falls. This is of particular importance in elderly patients.  Endocrine System:  Long-term administration is associated with endocrine abnormalities (endocrinopathies). (Also known as Opioid-induced Endocrinopathy) Influences  on both the hypothalamic-pituitary-adrenal axis?and the hypothalamic-pituitary-gonadal axis have been demonstrated with consequent hypogonadism and adrenal insufficiency in both sexes. Hypogonadism and decreased levels of dehydroepiandrosterone sulfate have been reported in men and women. Endocrine effects include: Amenorrhoea in women (abnormal absence of menstruation) Reduced libido in both sexes Decreased sexual function Erectile dysfunction in men Hypogonadisms (decreased testicular function with shrinkage of testicles) Infertility Depression and fatigue Loss of muscle mass Anxiety Depression Immune suppression Hyperalgesia Weight gain Anemia Osteoporosis Patients (particularly women of childbearing age) should avoid opioids. There is insufficient evidence to recommend routine monitoring of asymptomatic patients taking opioids in the long-term for hormonal deficiencies.  Immune System: Human studies have demonstrated that opioids have an immunomodulating effect. These effects are mediated via opioid receptors both on immune effector cells and in the central nervous system. Opioids have been demonstrated to have adverse effects on antimicrobial response and anti-tumour surveillance. Buprenorphine has been demonstrated to have no impact on immune function.  Opioid Induced Hyperalgesia: Human studies have demonstrated that prolonged use of opioids can lead to a state of abnormal pain sensitivity, sometimes called opioid induced hyperalgesia (OIH). Opioid induced hyperalgesia is not usually seen in the absence of tolerance to opioid analgesia. Clinically, hyperalgesia may be diagnosed if the patient on long-term opioid therapy presents with increased pain. This might be qualitatively and anatomically distinct from pain related to disease progression or to breakthrough pain resulting from development of opioid tolerance. Pain associated with hyperalgesia tends to be more diffuse than the  pre-existing pain and less defined in quality. Management of opioid induced hyperalgesia requires opioid dose reduction.  Cancer: Chronic opioid therapy has been associated with an increased risk of cancer among noncancer patients with chronic pain. This association was more evident in chronic strong opioid users. Chronic opioid consumption causes significant pathological changes in the small intestine and colon. Epidemiological studies have found that there is a link between opium dependence and initiation of gastrointestinal cancers. Cancer is the second leading cause of death after cardiovascular disease. Chronic use of opioids can cause multiple conditions such as GERD, immunosuppression and renal damage as well as carcinogenic effects, which are associated with the incidence of cancers.   Mortality: Long-term opioid use   has been associated with increased mortality among patients with chronic non-cancer pain (CNCP).  Prescription of long-acting opioids for chronic noncancer pain was associated with a significantly increased risk of all-cause mortality, including deaths from causes other than overdose.  Reference: Von Korff M, Kolodny A, Deyo RA, Chou R. Long-term opioid therapy reconsidered. Ann Intern Med. 2011 Sep 6;155(5):325-8. doi: 10.7326/[id number]. PMID: [id number]; PMCIDXX:1631110. Morley Kos, Hayward RA, Dunn KM, [place] KP. Risk of adverse events in patients prescribed long-term opioids: A cohort study in the [place] Clinical Practice Research D[employer]. Eur J Pain. 2019 May;23(5):908-922. doi: 10.1002/ejp.1357. Epub 2019 Jan 31. PMID: [id number]. Colameco S, Coren JS, Ciervo CA. Continuous opioid treatment for chronic noncancer pain: a time for moderation in prescribing. Postgrad Med. 2009 Jul;121(4):61-6. doi: 10.3810/pgm.2009.07.2032. PMID: [id number]. Heywood Bene RN, [place] SD, Blazina I, Rosalio Loud, Bougatsos C, Deyo RA. The  effectiveness and risks of long-term opioid therapy for chronic pain: a systematic review for a [employer] of Health Pathways to [employer]. Ann Intern Med. 2015 Feb 17;162(4):276-86. doi: [id number]. PMID: [id number]. Marjory Sneddon [facility], [place] DM. NCHS Data Brief No. 22. [place]: Centers for Disease Control and Prevention; 2009. Sep, Increase in Fatal Poisonings Involving Opioid Analgesics in the [place], 1999-2006. Song IA, Choi HR, Oh TK. Long-term opioid use and mortality in patients with chronic non-cancer pain: Ten-year follow-up study in [place] from 2010 through 2019. EClinicalMedicine. 2022 Jul 18;51:101558. doi: 10.1016/j.eclinm.2022.UB:5887891. PMID: [id number]; PMCIDOX:8550940. Huser, W., Schubert, T., Vogelmann, T. et al. All-cause mortality in patients with long-term opioid therapy compared with non-opioid analgesics for chronic non-cancer pain: a database study. [place] Med 18, 162 (2020). [email or web address] Rashidian H, Roxy Cedar, Malekzadeh R, Haghdoost AA. An Ecological Study of the Association between Opiate Use and Incidence of Cancers. Addict Health. 2016 Fall;8(4):252-260. PMID: [id number]; PMCIDQI:9185013.  Our Goal: Our goal is to control your pain with means other than the use of opioid pain medications.  Our Recommendation: Talk to your physician about coming off of these medications. We can assist you with the tapering down and stopping these medicines. Based on the new information, even if you cannot completely stop the medication, a decrease in the dose may be associated with a lesser risk. Ask for other means of controlling the pain. Decrease or eliminate those factors that significantly contribute to your pain such as smoking, obesity, and a diet heavily tilted towards "inflammatory" nutrients.  Last Updated: 10/04/2022    ____________________________________________________________________________________________     ____________________________________________________________________________________________  Patient Information update  To: All of our patients.  Re: Name change.  It has been made official that our current name, "[facility]"   will soon be changed to "[facility]".   The purpose of this change is to eliminate any confusion created by the concept of our practice being a "Medication Management Pain Clinic". In the past this has led to the misconception that we treat pain primarily by the use of prescription medications.  Nothing can be farther from the truth.   Understanding PAIN MANAGEMENT: To further understand what our practice does, you first have to understand that "Pain Management" is a subspecialty that requires additional training once a physician has completed their specialty training, which can be in either Anesthesia, Neurology, Psychiatry, or Physical Medicine and Rehabilitation (PMR). Each one of these contributes to the final approach taken by each physician to  the management of their patient's pain. To be a "Pain Management Specialist" you must have first completed one of the specialty trainings below.  Anesthesiologists - trained in clinical pharmacology and interventional techniques such as nerve blockade and regional as well as central neuroanatomy. They are trained to block pain before, during, and after surgical interventions.  Neurologists - trained in the diagnosis and pharmacological treatment of complex neurological conditions, such as Multiple Sclerosis, Parkinson's, spinal cord injuries, and other systemic conditions that may be associated with symptoms that may include but are not limited to pain. They tend to rely primarily on the treatment of chronic pain  using prescription medications.  Psychiatrist - trained in conditions affecting the psychosocial wellbeing of patients including but not limited to depression, anxiety, schizophrenia, personality disorders, addiction, and other substance use disorders that may be associated with chronic pain. They tend to rely primarily on the treatment of chronic pain using prescription medications.   Physical Medicine and Rehabilitation (PMR) physicians, also known as physiatrists - trained to treat a wide variety of medical conditions affecting the brain, spinal cord, nerves, bones, joints, ligaments, muscles, and tendons. Their training is primarily aimed at treating patients that have suffered injuries that have caused severe physical impairment. Their training is primarily aimed at the physical therapy and rehabilitation of those patients. They may also work alongside orthopedic surgeons or neurosurgeons using their expertise in assisting surgical patients to recover after their surgeries.  INTERVENTIONAL PAIN MANAGEMENT is sub-subspecialty of Pain Management.  Our physicians are Board-certified in Anesthesia, Pain Management, and Interventional Pain Management.  This meaning that not only have they been trained and Board-certified in their specialty of Anesthesia, and subspecialty of Pain Management, but they have also received further training in the sub-subspecialty of Interventional Pain Management, in order to become Board-certified as INTERVENTIONAL PAIN MANAGEMENT SPECIALIST.    Mission: Our goal is to use our skills in  [place] as alternatives to the chronic use of prescription opioid medications for the treatment of pain. To make this more clear, we have changed our name to reflect what we do and offer. We will continue to offer medication management assessment and recommendations, but we will not be taking over any patient's medication  management.  ____________________________________________________________________________________________     ____________________________________________________________________________________________  National Pain Medication Shortage  The [place] is experiencing worsening drug shortages. These have had a negative widespread effect on patient care and treatment. Not expected to improve any time soon. Predicted to last past 2029.   Drug shortage list (generic names) Oxycodone IR Oxycodone/APAP Oxymorphone IR Hydromorphone Hydrocodone/APAP Morphine  Where is the problem?  Manufacturing and supply level.  Will this shortage affect you?  Only if you take any of the above pain medications.  How? You may be unable to fill your prescription.  Your pharmacist may offer a "partial fill" of your prescription. (Warning: Do not accept partial fills.) Prescriptions partially filled cannot be transferred to another pharmacy. Read our Medication Rules and Regulation. Depending on how much medicine you are dependent on, you may experience withdrawals when unable to get the medication.  Recommendations: Consider ending your dependence on opioid pain medications. Ask your pain specialist to assist you with the process. Consider switching to a medication currently not in shortage, such as Buprenorphine. Talk to your pain specialist about this option. Consider decreasing your pain medication requirements by managing tolerance thru "Drug Holidays". This may help minimize withdrawals, should you run out of medicine. Control your pain thru  the use of non-pharmacological interventional therapies.   Your prescriber: Prescribers cannot be blamed for shortages. Medication manufacturing and supply issues cannot be fixed by the prescriber.   NOTE: The prescriber is not responsible for supplying the medication, or solving supply issues. Work with your pharmacist to solve it. The patient is responsible for  the decision to take or continue taking the medication and for identifying and securing a legal supply source. By law, supplying the medication is the job and responsibility of the pharmacy. The prescriber is responsible for the evaluation, monitoring, and prescribing of these medications.   Prescribers will NOT: Re-issue prescriptions that have been partially filled. Re-issue prescriptions already sent to a pharmacy.  Re-send prescriptions to a different pharmacy because yours did not have your medication. Ask pharmacist to order more medicine or transfer the prescription to another pharmacy. (Read below.)  New 2023 regulation: "April 07, 2022 Revised Regulation Allows DEA-Registered Pharmacies to Transfer Electronic Prescriptions at a Patient's Request [place] Patients now have the ability to request their electronic prescription be transferred to another pharmacy without having to go back to their practitioner to initiate the request. This revised regulation went into effect on Monday, April 03, 2022.     At a patient's request, a DEA-registered retail pharmacy can now transfer an electronic prescription for a controlled substance (schedules II-V) to another DEA-registered retail pharmacy. Prior to this change, patients would have to go through their practitioner to cancel their prescription and have it re-issued to a different pharmacy. The process was taxing and time consuming for both patients and practitioners.    The Drug Enforcement Administration [facility]) published its intent to revise the process for transferring electronic prescriptions on June 25, 2020.  The final rule was published in the federal register on March 02, 2022 and went into effect 30 days later.  Under the final rule, a prescription can only be transferred once between pharmacies, and only if allowed under existing state or other applicable law. The prescription must  remain in its electronic form; may not be altered in any way; and the transfer must be communicated directly between two licensed pharmacists. It's important to note, any authorized refills transfer with the original prescription, which means the entire prescription will be filled at the same pharmacy".  Reference: [employer]at [facility] website announcement)  [email or web address].pdf ([place])   [employer] / Vol. 88, No. 143 / Thursday, March 02, 2022 / Rules and Regulations DEPARTMENT OF JUSTICE  Drug Enforcement Administration  21 CFR Part 1306  [Docket No. [id number]]  RIN [id number] Transfer of Electronic Prescriptions for Schedules II-V Controlled Substances Between Pharmacies for Initial Filling  ____________________________________________________________________________________________     ____________________________________________________________________________________________  Transfer of Pain Medication between Pharmacies  Re: 2023 DEA Clarification on existing regulation  Published on DEA Website: April 07, 2022  Title: Revised Regulation Allows DEA-Registered Pharmacies to Conservator, museum/gallery Prescriptions at a Patient's Request [place]  "Patients now have the ability to request their electronic prescription be transferred to another pharmacy without having to go back to their practitioner to initiate the request. This revised regulation went into effect on Monday, April 03, 2022.     At a patient's request, a DEA-registered retail pharmacy can now transfer an electronic prescription for a controlled substance (schedules II-V) to another DEA-registered retail pharmacy. Prior to this change, patients would have to go through their practitioner to  cancel their prescription  and have it re-issued to a different pharmacy. The process was taxing and time consuming for both patients and practitioners.    The Drug Enforcement Administration (DEA) published its intent to revise the process for transferring electronic prescriptions on June 25, 2020.  The final rule was published in the federal register on March 02, 2022 and went into effect 30 days later.  Under the final rule, a prescription can only be transferred once between pharmacies, and only if allowed under existing state or other applicable law. The prescription must remain in its electronic form; may not be altered in any way; and the transfer must be communicated directly between two licensed pharmacists. It's important to note, any authorized refills transfer with the original prescription, which means the entire prescription will be filled at the same pharmacy."    REFERENCES: 1. DEA website announcement https://www.dea.gov/stories/2023/2023-04/2022-09-01/revised-regulation-allows-dea-registered-pharmacies-transfer  2. Department of Justice website  https://www.govinfo.gov/content/pkg/FR-2022-03-02/pdf/2023-15847.pdf  3. DEPARTMENT OF JUSTICE Drug Enforcement Administration 21 CFR Part 1306 [Docket No. [id number]] RIN [id number] "Transfer of Electronic Prescriptions for Schedules II-V Controlled Substances Between Pharmacies for Initial Filling"  ____________________________________________________________________________________________     _______________________________________________________________________  Medication Rules  Purpose: To inform patients, and their family members, of our medication rules and regulations.  Applies to: All patients receiving prescriptions from our practice (written or electronic).  Pharmacy of record: This is the pharmacy where your electronic prescriptions will be sent. Make sure we have the correct one.  Electronic prescriptions: In  compliance with the [place] Strengthen Opioid Misuse Prevention (STOP) Act of 2017 (Session Law 2017-74/H243), effective August 07, 2018, all controlled substances must be electronically prescribed. Written prescriptions, faxing, or calling prescriptions to a pharmacy will no longer be done.  Prescription refills: These will be provided only during in-person appointments. No medications will be renewed without a "face-to-face" evaluation with your provider. Applies to all prescriptions.  NOTE: The following applies primarily to controlled substances (Opioid* Pain Medications).   Type of encounter (visit): For patients receiving controlled substances, face-to-face visits are required. (Not an option and not up to the patient.)  Patient's responsibilities: Pain Pills: Bring all pain pills to every appointment (except for procedure appointments). Pill Bottles: Bring pills in original pharmacy bottle. Bring bottle, even if empty. Always bring the bottle of the most recent fill.  Medication refills: You are responsible for knowing and keeping track of what medications you are taking and when is it that you will need a refill. The day before your appointment: write a list of all prescriptions that need to be refilled. The day of the appointment: give the list to the admitting nurse. Prescriptions will be written only during appointments. No prescriptions will be written on procedure days. If you forget a medication: it will not be "Called in", "Faxed", or "electronically sent". You will need to get another appointment to get these prescribed. No early refills. Do not call asking to have your prescription filled early. Partial  or short prescriptions: Occasionally your pharmacy may not have enough pills to fill your prescription.  NEVER ACCEPT a partial fill or a prescription that is short of the total amount of pills that you were prescribed.  With controlled substances the law allows 72 hours for  the pharmacy to complete the prescription.  If the prescription is not completed within 72 hours, the pharmacist will require a new prescription to be written. This means that you will be short on your medicine and we WILL NOT send another prescription to complete your original   prescription.  Instead, request the pharmacy to send a carrier to a nearby branch to get enough medication to provide you with your full prescription. Prescription Accuracy: You are responsible for carefully inspecting your prescriptions before leaving our office. Have the discharge nurse carefully go over each prescription with you, before taking them home. Make sure that your name is accurately spelled, that your address is correct. Check the name and dose of your medication to make sure it is accurate. Check the number of pills, and the written instructions to make sure they are clear and accurate. Make sure that you are given enough medication to last until your next medication refill appointment. Taking Medication: Take medication as prescribed. When it comes to controlled substances, taking less pills or less frequently than prescribed is permitted and encouraged. Never take more pills than instructed. Never take the medication more frequently than prescribed.  Inform other Doctors: Always inform, all of your healthcare providers, of all the medications you take. Pain Medication from other Providers: You are not allowed to accept any additional pain medication from any other Doctor or Healthcare provider. There are two exceptions to this rule. (see below) In the event that you require additional pain medication, you are responsible for notifying us, as stated below. Cough Medicine: Often these contain an opioid, such as codeine or hydrocodone. Never accept or take cough medicine containing these opioids if you are already taking an opioid* medication. The combination may cause respiratory failure and death. Medication Agreement:  You are responsible for carefully reading and following our Medication Agreement. This must be signed before receiving any prescriptions from our practice. Safely store a copy of your signed Agreement. Violations to the Agreement will result in no further prescriptions. (Additional copies of our Medication Agreement are available upon request.) Laws, Rules, & Regulations: All patients are expected to follow all Federal and [employer], [employer], Rules, [facility] [facility]. Ignorance of the Laws does not constitute a valid excuse.  Illegal drugs and Controlled Substances: The use of illegal substances (including, but not limited to marijuana and its derivatives) and/or the illegal use of any controlled substances is strictly prohibited. Violation of this rule may result in the immediate and permanent discontinuation of any and all prescriptions being written by our practice. The use of any illegal substances is prohibited. Adopted CDC guidelines & recommendations: Target dosing levels will be at or below 60 MME/day. Use of benzodiazepines** is not recommended.  Exceptions: There are only two exceptions to the rule of not receiving pain medications from other Healthcare Providers. Exception #1 (Emergencies): In the event of an emergency (i.e.: accident requiring emergency care), you are allowed to receive additional pain medication. However, you are responsible for: As soon as you are able, call our office [phone], at any time of the day or night, and leave a message stating your name, the date and nature of the emergency, and the name and dose of the medication prescribed. In the event that your call is answered by a member of our staff, make sure to document and save the date, time, and the name of the person that took your information.  Exception #2 (Planned Surgery): In the event that you are scheduled by another doctor or dentist to have any type of surgery or procedure, you are allowed (for a period no  longer than 30 days), to receive additional pain medication, for the acute post-op pain. However, in this case, you are responsible for picking up a copy of  our "Post-op Pain Management for Surgeons" handout, and giving it to your surgeon or dentist. This document is available at our office, and does not require an appointment to obtain it. Simply go to our office during business hours (Monday-Thursday from 8:00 AM to 4:00 PM) (Friday 8:00 AM to 12:00 Noon) or if you have a scheduled appointment with [place], prior to your surgery, and ask for it by name. In addition, you are responsible for: calling our office [phone], at any time of the day or night, and leaving a message stating your name, name of your surgeon, type of surgery, and date of procedure or surgery. Failure to comply with your responsibilities may result in termination of therapy involving the controlled substances. Medication Agreement Violation. Following the above rules, including your responsibilities will help you in avoiding a Medication Agreement Violation ("Breaking your Pain Medication Contract").  Consequences:  Not following the above rules may result in permanent discontinuation of medication prescription therapy.  *Opioid medications include: morphine, codeine, oxycodone, oxymorphone, hydrocodone, hydromorphone, meperidine, tramadol, tapentadol, buprenorphine, fentanyl, methadone. **Benzodiazepine medications include: diazepam (Valium), alprazolam (Xanax), clonazepam (Klonopine), lorazepam (Ativan), clorazepate (Tranxene), chlordiazepoxide (Librium), estazolam (Prosom), oxazepam (Serax), temazepam (Restoril), triazolam (Halcion) (Last updated: 05/30/2022) ______________________________________________________________________    ______________________________________________________________________  Medication Recommendations and Reminders  Applies to: All patients receiving prescriptions (written and/or  electronic).  Medication Rules & Regulations: You are responsible for reading, knowing, and following our "Medication Rules" document. These exist for your safety and that of others. They are not flexible and neither are we. Dismissing or ignoring them is an act of "non-compliance" that may result in complete and irreversible termination of such medication therapy. For safety reasons, "non-compliance" will not be tolerated. As with the [place] fundamental legal principle of "ignorance of the law is no defense", we will accept no excuses for not having read and knowing the content of documents provided to you by our practice.  Pharmacy of record:  Definition: This is the pharmacy where your electronic prescriptions will be sent.  We do not endorse any particular pharmacy. It is up to you and your insurance to decide what pharmacy to use.  We do not restrict you in your choice of pharmacy. However, once we write for your prescriptions, we will NOT be re-sending more prescriptions to fix restricted supply problems created by your pharmacy, or your insurance.  The pharmacy listed in the electronic medical record should be the one where you want electronic prescriptions to be sent. If you choose to change pharmacy, simply notify our nursing staff. Changes will be made only during your regular appointments and not over the phone.  Recommendations: Keep all of your pain medications in a safe place, under lock and key, even if you live alone. We will NOT replace lost, stolen, or damaged medication. We do not accept "Police Reports" as proof of medications having been stolen. After you fill your prescription, take 1 week's worth of pills and put them away in a safe place. You should keep a separate, properly labeled bottle for this purpose. The remainder should be kept in the original bottle. Use this as your primary supply, until it runs out. Once it's gone, then you know that you have 1 week's worth of medicine,  and it is time to come in for a prescription refill. If you do this correctly, it is unlikely that you will ever run out of medicine. To make sure that the above recommendation works, it is very important that you make  sure your medication refill appointments are scheduled at least 1 week before you run out of medicine. To do this in an effective manner, make sure that you do not leave the office without scheduling your next medication management appointment. Always ask the nursing staff to show you in your prescription , when your medication will be running out. Then arrange for the receptionist to get you a return appointment, at least 7 days before you run out of medicine. Do not wait until you have 1 or 2 pills left, to come in. This is very poor planning and does not take into consideration that we may need to cancel appointments due to bad weather, sickness, or emergencies affecting our staff. DO NOT ACCEPT A "Partial Fill": If for any reason your pharmacy does not have enough pills/tablets to completely fill or refill your prescription, do not allow for a "partial fill". The law allows the pharmacy to complete that prescription within 72 hours, without requiring a new prescription. If they do not fill the rest of your prescription within those 72 hours, you will need a separate prescription to fill the remaining amount, which we will NOT provide. If the reason for the partial fill is your insurance, you will need to talk to the pharmacist about payment alternatives for the remaining tablets, but again, DO NOT ACCEPT A PARTIAL FILL, unless you can trust your pharmacist to obtain the remainder of the pills within 72 hours.  Prescription refills and/or changes in medication(s):  Prescription refills, and/or changes in dose or medication, will be conducted only during scheduled medication management appointments. (Applies to both, written and electronic prescriptions.) No refills on procedure days. No  medication will be changed or started on procedure days. No changes, adjustments, and/or refills will be conducted on a procedure day. Doing so will interfere with the diagnostic portion of the procedure. No phone refills. No medications will be "called into the pharmacy". No Fax refills. No weekend refills. No Holliday refills. No after hours refills.  Remember:  Business hours are:  Monday to Thursday 8:00 AM to 4:00 PM Provider's Schedule: Milinda Pointer, MD - Appointments are:  Medication management: Monday and Wednesday 8:00 AM to 4:00 PM Procedure day: Tuesday and Thursday 7:30 AM to 4:00 PM Gillis Santa, MD - Appointments are:  Medication management: Tuesday and Thursday 8:00 AM to 4:00 PM Procedure day: Monday and Wednesday 7:30 AM to 4:00 PM (Last update: 05/30/2022) ______________________________________________________________________    ____________________________________________________________________________________________  Drug Holidays  What is a "Drug Holiday"? Drug Holiday: is the name given to the process of slowly tapering down and temporarily stopping the pain medication for the purpose of decreasing or eliminating tolerance to the drug.  Benefits Improved effectiveness Decreased required effective dose Improved pain control End dependence on high dose therapy Decrease cost of therapy Uncovering "opioid-induced hyperalgesia". (OIH)  What is "opioid hyperalgesia"? It is a paradoxical increase in pain caused by exposure to opioids. Stopping the opioid pain medication, contrary to the expected, it actually decreases or completely eliminates the pain. Ref.: "A comprehensive review of opioid-induced hyperalgesia". Brion Aliment, et.al. Pain Physician. 2011 Mar-Apr;14(2):145-61.  What is tolerance? Tolerance: the progressive loss of effectiveness of a pain medicine due to repetitive use. A common problem of opioid pain medications.  How long should a "Drug  Holiday" last? Effectiveness depends on the patient staying off all opioid pain medicines for a minimum of 14 consecutive days. (2 weeks)  How about just taking less of the medicine? Does not  work. Will not accomplish goal of eliminating the excess receptors.  How about switching to a different pain medicine? (AKA. "Opioid rotation") Does not work. Creates the illusion of effectiveness by taking advantage of inaccurate equivalent dose calculations between different opioids. -This "technique" was promoted by studies funded by [employer], such as [employer], creators of "OxyContin".  Can I stop the medicine "cold [place]"? We do not recommend it. You should always coordinate with your prescribing physician to make the transition as smoothly as possible. Avoid stopping the medicine abruptly without consulting. We recommend a "slow taper".  What is a slow taper? Taper: refers to the gradual decrease in dose.   How do I stop/taper the dose? Slowly. Decrease the daily amount of pills that you take by one (1) pill every seven (7) days. This is called a "slow downward taper". Example: if you normally take four (4) pills per day, drop it to three (3) pills per day for seven (7) days, then to two (2) pills per day for seven (7) days, then to one (1) per day for seven (7) days, and then stop the medicine. The 14 day "Drug Holiday" starts on the first day without medicine.   Will I experience withdrawals? Unlikely with a slow taper.  What triggers withdrawals? Withdrawals are triggered by the sudden/abrupt stop of high dose opioids. Withdrawals can be avoided by slowly decreasing the dose over a prolonged period of time.  What are withdrawals? Symptoms associated with sudden/abrupt reduction/stopping of high-dose, long-term use of pain medication. Withdrawal are seldom seen on low dose therapy, or patients rarely taking opioid medication.  Early Withdrawal Symptoms may  include: Agitation Anxiety Muscle aches Increased tearing Insomnia Runny nose Sweating Yawning  Late symptoms may include: Abdominal cramping Diarrhea Dilated pupils Goose bumps Nausea Vomiting  When could I see withdrawals? Onset: 8-24 hours after last use for most opioids. 12-48 hours for long-acting opioids (i.e.: methadone)  How long could they last? Duration: 4-10 days for most opioids. 14-21 days for long-acting opioids (i.e.: methadone)  What will happen after I complete my "Drug Holiday"? The need and indications for the opioid analgesic will be reviewed before restarting the medication. Dose requirements will likely decrease and the dose will need to be adjusted accordingly.   (Last update: 10/25/2022) ____________________________________________________________________________________________    ____________________________________________________________________________________________  WARNING: CBD (cannabidiol) & Delta (Delta-8 tetrahydrocannabinol) products.   Applicable to:  All individuals currently taking or considering taking CBD (cannabidiol) and, more important, all patients taking opioid analgesic controlled substances (pain medication). (Example: oxycodone; oxymorphone; hydrocodone; hydromorphone; morphine; methadone; tramadol; tapentadol; fentanyl; buprenorphine; butorphanol; dextromethorphan; meperidine; codeine; etc.)  Introduction:  Recently there has been a drive towards the use of "natural" products for the treatment of different conditions, including pain anxiety and sleep disorders. Marijuana and hemp are two varieties of the cannabis genus plants. Marijuana and its derivatives are illegal, while hemp and its derivatives are not. Cannabidiol (CBD) and tetrahydrocannabinol (THC), are two natural compounds found in plants of the Cannabis genus. They can both be extracted from hemp or marijuana. Both compounds interact with your body's endocannabinoid  system in very different ways. CBD is associated with pain relief (analgesia) while THC is associated with the psychoactive effects ("the high") obtained from the use of marijuana products. There are two main types of THC: Delta-9, which comes from the marijuana plant and it is illegal, and Delta-8, which comes from the hemp plant, and it is legal. (Both, Delta-9-THC and Delta-8-THC are psychoactive and  give you "the high".)   Legality:  Marijuana and its derivatives: illegal Hemp and its derivatives: Legal (State dependent) UPDATE: (09/23/2021) The Drug Enforcement Agency ([place]) issued a letter stating that "delta" cannabinoids, including Delta-8-THCO and Delta-9-THCO, synthetically derived from hemp do not qualify as hemp and will be viewed as Schedule I drugs. (Schedule I drugs, substances, or chemicals are defined as drugs with no currently accepted medical use and a high potential for abuse. Some examples of Schedule I drugs are: heroin, lysergic acid diethylamide (LSD), marijuana (cannabis), 3,4-methylenedioxymethamphetamine (ecstasy), methaqualone, and peyote.) (https://jennings.com/)  Legal status of CBD in [place]:  "Conditionally Legal"  Reference: "FDA Regulation of Cannabis and Cannabis-Derived Products, Including Cannabidiol (CBD)" - Se[employer]Art[employer].com.pt  Warning:  CBD is not FDA approved and has not undergo the same manufacturing controls as prescription drugs.  This means that the purity and safety of available CBD may be questionable. Most of the time, despite manufacturer's claims, it is contaminated with THC (delta-9-tetrahydrocannabinol - the chemical in marijuana responsible for the "HIGH").  When this is the case, the [facility] contaminant will trigger a positive urine drug screen (UDS) test for Marijuana (carboxy-THC).   The FDA recently put out a warning about 5 things that everyone  should be aware of regarding Delta-8 THC: Delta-8 THC products have not been evaluated or approved by the FDA for safe use and may be marketed in ways that put the public health at risk. The FDA has received adverse event reports involving delta-8 THC-containing products. Delta-8 THC has psychoactive and intoxicating effects. Delta-8 THC manufacturing often involve use of potentially harmful chemicals to create the concentrations of delta-8 THC claimed in the marketplace. The final delta-8 THC product may have potentially harmful by-products (contaminants) due to the chemicals used in the process. Manufacturing of delta-8 THC products may occur in uncontrolled or unsanitary settings, which may lead to the presence of unsafe contaminants or other potentially harmful substances. Delta-8 THC products should be kept out of the reach of children and pets.  NOTE: Because a positive UDS for any illicit substance is a violation of our medication agreement, your opioid analgesics (pain medicine) may be permanently discontinued.  MORE ABOUT CBD  General Information: CBD was discovered in [birth year] and it is a derivative of the cannabis sativa genus plants (Marijuana and Hemp). It is one of the 113 identified substances found in Marijuana. It accounts for up to 40% of the plant's extract. As of 2018, preliminary clinical studies on CBD included research for the treatment of anxiety, movement disorders, and pain. CBD is available and consumed in multiple forms, including inhalation of smoke or vapor, as an aerosol spray, and by mouth. It may be supplied as an oil containing CBD, capsules, dried cannabis, or as a liquid solution. CBD is thought not to be as psychoactive as THC (delta-9-tetrahydrocannabinol - the chemical in marijuana responsible for the "HIGH"). Studies suggest that CBD may interact with different biological target receptors in the body, including cannabinoid and other neurotransmitter receptors. As of  2018 the mechanism of action for its biological effects has not been determined.  Side-effects  Adverse reactions: Dry mouth, diarrhea, decreased appetite, fatigue, drowsiness, malaise, weakness, sleep disturbances, and others.  Drug interactions:  CBD may interact with medications such as blood-thinners. CBD causes drowsiness on its own and it will increase drowsiness caused by other medications, including antihistamines (such as Benadryl), benzodiazepines (Xanax, Ativan, Valium), antipsychotics, antidepressants, opioids, alcohol and supplements such as kava, melatonin and St. John's Wort.  Other drug interactions: Brivaracetam (Briviact); Caffeine; Carbamazepine (Tegretol); Citalopram (Celexa); Clobazam (Onfi); Eslicarbazepine (Aptiom); Everolimus (Zostress); Lithium; Methadone (Dolophine); Rufinamide (Banzel); Sedative medications (CNS depressants); Sirolimus (Rapamune); Stiripentol (Diacomit); Tacrolimus (Prograf); Tamoxifen ; Soltamox); Topiramate (Topamax); Valproate; Warfarin (Coumadin); Zonisamide. (Last update: 07/17/2022) ____________________________________________________________________________________________   ____________________________________________________________________________________________  Naloxone Nasal Spray  Why am I receiving this medication? [place] STOP ACT requires that all patients taking high dose opioids or at risk of opioids respiratory depression, be prescribed an opioid reversal agent, such as Naloxone (AKA: Narcan).  What is this medication? NALOXONE (nal OX one) treats opioid overdose, which causes slow or shallow breathing, severe drowsiness, or trouble staying awake. Call emergency services after using this medication. You may need additional treatment. Naloxone works by reversing the effects of opioids. It belongs to a group of medications called opioid blockers.  COMMON BRAND NAME(S): Kloxxado, Narcan  What should I tell my care team before  I take this medication? They need to know if you have any of these conditions: Heart disease Substance use disorder An unusual or allergic reaction to naloxone, other medications, foods, dyes, or preservatives Pregnant or trying to get pregnant Breast-feeding  When to use this medication? This medication is to be used for the treatment of respiratory depression (less than 8 breaths per minute) secondary to opioid overdose.   How to use this medication? This medication is for use in the nose. Lay the person on their back. Support their neck with your hand and allow the head to tilt back before giving the medication. The nasal spray should be given into 1 nostril. After giving the medication, move the person onto their side. Do not remove or test the nasal spray until ready to use. Get emergency medical help right away after giving the first dose of this medication, even if the person wakes up. You should be familiar with how to recognize the signs and symptoms of a narcotic overdose. If more doses are needed, give the additional dose in the other nostril. Talk to your care team about the use of this medication in children. While this medication may be prescribed for children as young as newborns for selected conditions, precautions do apply.  Naloxone Overdosage: If you think you have taken too much of this medicine contact a poison control center or emergency room at once.  NOTE: This medicine is only for you. Do not share this medicine with others.  What if I miss a dose? This does not apply.  What may interact with this medication? This is only used during an emergency. No interactions are expected during emergency use. This list may not describe all possible interactions. Give your health care provider a list of all the medicines, herbs, non-prescription drugs, or dietary supplements you use. Also tell them if you smoke, drink alcohol, or use illegal drugs. Some items may interact with  your medicine.  What should I watch for while using this medication? Keep this medication ready for use in the case of an opioid overdose. Make sure that you have the phone number of your care team and local hospital ready. You may need to have additional doses of this medication. Each nasal spray contains a single dose. Some emergencies may require additional doses. After use, bring the treated person to the nearest hospital or call 911. Make sure the treating care team knows that the person has received a dose of this medication. You will receive additional instructions on what to do during and after use of this  medication before an emergency occurs.  What side effects may I notice from receiving this medication? Side effects that you should report to your care team as soon as possible: Allergic reactions--skin rash, itching, hives, swelling of the face, lips, tongue, or throat Side effects that usually do not require medical attention (report these to your care team if they continue or are bothersome): Constipation Dryness or irritation inside the nose Headache Increase in blood pressure Muscle spasms Stuffy nose Toothache This list may not describe all possible side effects. Call your doctor for medical advice about side effects. You may report side effects to FDA at 1-800-FDA-1088.  Where should I keep my medication? Because this is an emergency medication, you should keep it with you at all times.  Keep out of the reach of children and pets. Store between 20 and 25 degrees C (68 and 77 degrees F). Do not freeze. Throw away any unused medication after the expiration date. Keep in original box until ready to use.  NOTE: This sheet is a summary. It may not cover all possible information. If you have questions about this medicine, talk to your doctor, pharmacist, or health care provider.   2023 Elsevier/Gold Standard (2021-04-01  00:00:00)  ____________________________________________________________________________________________

## 2022-11-02 ENCOUNTER — Ambulatory Visit (INDEPENDENT_AMBULATORY_CARE_PROVIDER_SITE_OTHER): Payer: Medicare Other

## 2022-11-02 VITALS — Ht 67.0 in | Wt 220.0 lb

## 2022-11-02 DIAGNOSIS — Z0000 Encounter for general adult medical examination without abnormal findings: Secondary | ICD-10-CM

## 2022-11-02 NOTE — Progress Notes (Signed)
I connected with  [name] on 11/02/22 by a audio enabled telemedicine application and verified that I am speaking with the correct person using two identifiers.  Patient Location: Home  Provider Location: Office/Clinic  I discussed the limitations of evaluation and management by telemedicine. The patient expressed understanding and agreed to proceed.  Subjective:   Mark [name] is a 67 y.o. male who presents for Medicare Annual/Subsequent preventive examination.  Review of Systems     Cardiac Risk Factors include: advanced age (>80men, >65 women);hypertension;family history of premature cardiovascular disease;male gender;obesity (BMI >30kg/m2)     Objective:    Today's Vitals   11/02/22 1628  Weight: 220 lb (99.8 kg)  Height: 5\' 7"  (1.702 m)  PainSc: 3   PainLoc: Neck   Body mass index is 34.46 kg/m.     11/02/2022    4:06 PM 11/01/2022    2:29 PM 07/24/2022    2:37 PM 05/01/2022    1:49 PM 10/31/2021   11:21 AM 10/31/2021   11:20 AM 03/23/2021    8:10 AM  Advanced Directives  Does Patient Have a Medical Advance Directive? No No No No  No No  Would patient like information on creating a medical advance directive? No - Patient declined No - Patient declined No - Patient declined  No - Patient declined No - Patient declined No - Patient declined    Current Medications (verified) Outpatient Encounter Medications as of 11/02/2022  Medication Sig   amLODipine (NORVASC) 2.5 MG tablet TAKE 1 TABLET BY MOUTH DAILY (in place of losartan)   Ascorbic Acid (VITAMIN C) 1000 MG tablet Take 1,000 mg by mouth daily.   aspirin 81 MG tablet Take 81 mg by mouth daily.   atorvastatin (LIPITOR) 20 MG tablet Take 1 tablet (20 mg total) by mouth at bedtime.   B Complex-C (SUPER B COMPLEX/VITAMIN C PO) Take by mouth daily.   cetirizine (ZYRTEC) 10 MG tablet Take 10 mg by mouth at bedtime.   Cholecalciferol (VITAMIN D3 PO) Take 2,000 Int'l Units by mouth daily.   Cyanocobalamin  (VITAMIN B12 PO) Take by mouth daily.   cyclobenzaprine (FLEXERIL) 10 MG tablet TAKE ONE-HALF TO ONE TABLET BY MOUTH 2 TIMES DAILY AS NEEDED FOR MUSCLE SPASMS   ferrous sulfate 325 (65 FE) MG EC tablet Take 325 mg by mouth daily with breakfast.   FLUoxetine (PROZAC) 20 MG capsule Take 1 capsule (20 mg total) by mouth daily.   FLUoxetine (PROZAC) 40 MG capsule Take 1 capsule (40 mg total) by mouth daily.   fluticasone (FLONASE) 50 MCG/ACT nasal spray Place 2 sprays into both nostrils daily.   furosemide (LASIX) 20 MG tablet Take 1 tablet (20 mg total) by mouth daily.   Garlic 123XX123 MG TABS Take by mouth daily.   metFORMIN (GLUCOPHAGE-XR) 500 MG 24 hr tablet Take 1 tablet (500 mg total) by mouth daily with breakfast.   Multiple Vitamins-Minerals (CERTAVITE SENIOR/ANTIOXIDANT) TABS Take 1 tablet by mouth daily.   naloxone (NARCAN) nasal spray 4 mg/0.1 mL Place 1 spray into the nose as needed for up to 365 doses (for opioid-induced respiratory depresssion). In case of emergency (overdose), spray once into each nostril. If no response within 3 minutes, repeat application and call [id number].   naproxen (NAPROSYN) 500 MG tablet TAKE 1 TABLET BY MOUTH TWICE DAILY AS NEEDED FOR MODERATE PAIN   Omega-3 Fatty Acids (FISH OIL) 1000 MG CAPS Take by mouth daily.   omeprazole (PRILOSEC) 20 MG capsule Take 1  capsule (20 mg total) by mouth daily.   [START ON 11/04/2022] Oxycodone HCl 10 MG TABS Take 1 tablet (10 mg total) by mouth every 8 (eight) hours as needed. Each refill must last 30 days.   [START ON 12/04/2022] Oxycodone HCl 10 MG TABS Take 1 tablet (10 mg total) by mouth every 8 (eight) hours as needed. Each refill must last 30 days.   [START ON 01/03/2023] Oxycodone HCl 10 MG TABS Take 1 tablet (10 mg total) by mouth every 8 (eight) hours as needed. Each refill must last 30 days.   potassium citrate (UROCIT-K) 10 MEQ (1080 MG) SR tablet Take 1 tablet (10 mEq total) by mouth 2 (two) times daily.   rOPINIRole (REQUIP) 2  MG tablet Take 1 tablet (2 mg total) by mouth at bedtime.   [DISCONTINUED] clindamycin (CLEOCIN) 150 MG capsule Take 150 mg by mouth as needed. Prior to dental appt (Patient not taking: Reported on 11/01/2022)   [DISCONTINUED] Oxycodone HCl 10 MG TABS Take 1 tablet (10 mg total) by mouth every 8 (eight) hours as needed. Each refill must last 30 days.   No facility-administered encounter medications on file as of 11/02/2022.    Allergies (verified) Ciprofloxacin, Losartan, Metformin, Buprenorphine hcl, Morphine and related, and Penicillins   History: Past Medical History:  Diagnosis Date   Anxiety    Arthritis    Chronic pain    Depression    Heart murmur    History of kidney stones 11/17/2015   Removed two kidney stones about 1 cm in size   Hydrocele in adult 05/07/2015   Hypertension    Kidney stones    Migraine    MRSA carrier 2019   Presence of permanent cardiac pacemaker    Medtronic implanted 2009 for sinus node dysfunction: generator change 2019   Prostatitis    Sleep apnea    Does not wear CPAP   Past Surgical History:  Procedure Laterality Date   CARDIOVASCULAR STRESS TEST  2014   Dr Ubaldo Glassing   CARPAL TUNNEL RELEASE Bilateral 2006   [place]  2008   C5/6   CHOLECYSTECTOMY N/A 10/27/2020   Procedure: LAPAROSCOPIC CHOLECYSTECTOMY;  Surgeon: Georganna Skeans, MD;  Location: [facility];  Service: General;  Laterality: N/A;   COLONOSCOPY  07/2007   prostate nodule, hemorrhoid, rpt 5 yrs [name])   ERCP N/A 10/26/2020   Procedure: ENDOSCOPIC RETROGRADE CHOLANGIOPANCREATOGRAPHY (ERCP);  Surgeon: Carol Ada, MD;  Location: [facility];  Service: Endoscopy;  Laterality: N/A;  choledocholithiasis   ESOPHAGOGASTRODUODENOSCOPY (EGD) WITH PROPOFOL N/A 10/26/2020   Procedure: ESOPHAGOGASTRODUODENOSCOPY (EGD) WITH PROPOFOL;  Surgeon: Carol Ada, MD;  Location: [facility];  Service: Endoscopy;  Laterality: N/A;   EUS  10/26/2020   Procedure: UPPER ENDOSCOPIC ULTRASOUND  (EUS) LINEAR;  Surgeon: Carol Ada, MD;  [facility]COPY;  Service: Endoscopy;;   IMPLANTABLE CARDIOVERTER DEFIBRILLATOR (ICD) GENERATOR CHANGE Left 12/13/2017   Procedure: PACER CHANGE OUT;  Surgeon: Isaias Cowman, MD;  Location: [facility];  Service: Cardiovascular;  Laterality: Left;   LAPAROSCOPIC CHOLECYSTECTOMY  10/2020   LITHOTRIPSY Bilateral 2012   Stoioff   LITHOTRIPSY Left 11/2015   x2 Stoioff   pace maker revision  12/13/2017   PACEMAKER PLACEMENT  2009   s/p Medtronic Adapta DR Pacemaker (Dr. Ubaldo Glassing)   REMOVAL OF STONES  10/26/2020   Procedure: REMOVAL OF STONES;  Surgeon: Carol Ada, MD;  Location: [facility];  Service: Endoscopy;;   ROTATOR CUFF REPAIR Left 2009   SPHINCTEROTOMY  10/26/2020  Procedure: SPHINCTEROTOMY;  Surgeon: Carol Ada, MD;  Location: [facility] ENDOSCOPY;  Service: Endoscopy;;   [place]  2008   Left   Family History  Problem Relation Age of Onset   Heart disease Mother    Diabetes Mother    Arthritis Mother    Cancer Mother        Breast Cancer   Heart block Father    Heart disease Father    Arthritis Father    Cancer Father        skin cancer   Social History   Socioeconomic History   Marital status: Divorced    Spouse name: Not on file   Number of children: Not on file   Years of education: Not on file   Highest education level: Not on file  Occupational History   Not on file  Tobacco Use   Smoking status: Never   Smokeless tobacco: Never  Vaping Use   Vaping Use: Never used  Substance and Sexual Activity   Alcohol use: No   Drug use: No   Sexual activity: Never  Other Topics Concern   Not on file  Social History Narrative   Divorced. Lives with wife.   Edu: 8th grade   Occ: retired, disability for chronic neck pain    Activity: no regular exercise - limited by back and knee pain   Diet: poor water, lots of tang and gatorade, no vegetables or fruits   Social Determinants of  Health   Financial Resource Strain: Low Risk  (11/02/2022)   Overall Financial Resource Strain (CARDIA)    Difficulty of Paying Living Expenses: Not hard at all  Food Insecurity: No Food Insecurity (11/02/2022)   Hunger Vital Sign    Worried About Running Out of Food in the Last Year: Never true    Ran Out of Food in the Last Year: Never true  Transportation Needs: No Transportation Needs (11/02/2022)   PRAPARE - Hydrologist (Medical): No    Lack of Transportation (Non-Medical): No  Physical Activity: Sufficiently Active (11/02/2022)   Exercise Vital Sign    Days of Exercise per Week: 7 days    Minutes of Exercise per Session: 30 min  Stress: No Stress Concern Present (11/02/2022)   [place]    Feeling of Stress : Not at all  Social Connections: Socially Isolated (11/02/2022)   Social Connection and Isolation Panel [NHANES]    Frequency of Communication with Friends and Family: Twice a week    Frequency of Social Gatherings with Friends and Family: Never    Attends Religious Services: Never    Marine scientist or Organizations: No    Attends Music therapist: Never    Marital Status: Divorced    Tobacco Counseling Counseling given: Not Answered   Clinical Intake:  Pre-visit preparation completed: Yes  Pain : 0-10 Pain Score: 3  Pain Type: Chronic pain Pain Location: Neck Pain Orientation: Left Pain Radiating Towards: Left Shoulder Pain Descriptors / Indicators: Aching, Dull, Discomfort Pain Onset: More than a month ago Pain Frequency: Constant Pain Relieving Factors: Medication, rest  Pain Relieving Factors: Medication, rest  BMI - recorded: 34.46 Nutritional Status: BMI > 30  Obese Nutritional Risks: None Diabetes: No CBG done?: No  How often do you need to have someone help you when you read instructions, pamphlets, or other written materials from  your doctor  or pharmacy?: 1 - Never What is the last grade level you completed in school?: 8th grade  Diabetic? No  Interpreter Needed?: No  Information entered by :: Lisette Abu, LPN.   Activities of Daily Living    11/02/2022    4:16 PM  In your present state of health, do you have any difficulty performing the following activities:  Hearing? 0  Vision? 0  Difficulty concentrating or making decisions? 0  Walking or climbing stairs? 0  Dressing or bathing? 0  Doing errands, shopping? 0  Preparing Food and eating ? N  Using the Toilet? N  In the past six months, have you accidently leaked urine? N  Do you have problems with loss of bowel control? N  Managing your Medications? N  Managing your Finances? N  Housekeeping or managing your Housekeeping? N    Patient Care Team: Ria Bush, MD as PCP - General (Family Medicine) Satira Sark, MD as PCP - Cardiology (Cardiology) Hyman Hopes, [place], [place] as Referring Physician (Dentistry) Milinda Pointer, MD as Referring Physician (Pain Medicine) Teodoro Spray, MD as Consulting Physician (Cardiology) Laverle Hobby, MD as Consulting Physician (Pulmonary Disease) Abbie Sons, MD (Urology) [facility] as Referring Physician (Optometry) Charlton Haws, [facility] as Pharmacist (Pharmacist)  Indicate any recent Medical Services you may have received from other than [facility] providers in the past year (date may be approximate).     Assessment:   This is a routine wellness examination for [place].  Hearing/Vision screen Hearing Screening - Comments:: Denies hearing difficulties   Vision Screening - Comments:: Wears rx glasses - up to date with routine eye exams with [facility]   Dietary issues and exercise activities discussed: Current Exercise Habits: Home exercise routine, Type of exercise: walking;Other - see comments (walks dog 3 times per day; no other regular exercise due to knee and back  pain.), Time (Minutes): 30, Frequency (Times/Week): 7, Weekly Exercise (Minutes/Week): 210, Intensity: Moderate, Exercise limited by: cardiac condition(s);respiratory conditions(s);Other - see comments (chronic pain syndrome)   Goals Addressed             This Visit's Progress    Client understands the importance of follow-up with providers by attending scheduled visits        Depression Screen    11/02/2022    4:21 PM 11/01/2022    2:29 PM 08/28/2022    2:22 PM 07/24/2022    2:37 PM 05/01/2022    1:49 PM 10/31/2021   11:28 AM 09/08/2020    2:06 PM  PHQ 2/9 Scores  PHQ - 2 Score 0 0 1 0 0 2 0  PHQ- 9 Score 0  2   8     Fall Risk    11/02/2022    4:21 PM 11/01/2022    2:29 PM 08/28/2022    2:22 PM 07/24/2022    2:37 PM 05/01/2022    1:49 PM  [place] in the past year? 0 0 0 0 0  Number falls in past yr: 0      Injury with Fall? 0      Risk for fall due to : No Fall Risks      Follow up Falls prevention discussed        FALL RISK PREVENTION PERTAINING TO THE HOME:  Any stairs in or around the home? No  If so, are there any without handrails? No  Home free of loose throw rugs in walkways, pet beds,  electrical cords, etc? Yes  Adequate lighting in your home to reduce risk of falls? Yes   ASSISTIVE DEVICES UTILIZED TO PREVENT FALLS:  Life alert? No  Use of a cane, walker or w/c? No  Grab bars in the bathroom? Yes  Shower chair or bench in shower? No  Elevated toilet seat or a handicapped toilet? No   TIMED UP AND GO:  Was the test performed? No . Telephonic Visit  Cognitive Function:    08/27/2020    8:27 AM 08/20/2019    2:12 PM 08/16/2018    2:33 PM 08/13/2017    2:32 PM 08/04/2016   10:26 AM  MMSE - Mini Mental State Exam  Orientation to time 5 5 5 5 5   Orientation to Place 5 5 5 5 5   Registration 3 3 3 3 3   Attention/ Calculation 5 0 0 0 0  Attention/Calculation-comments  can't spell     Recall 3 3 2 3 1   Recall-comments   unable to recall 1 of 3  words  pt was unable to recall 2 of 3 words  Language- name 2 objects   0 0 0  Language- repeat 1 1 1 1 1   Language- follow 3 step command   3 3 3   Language- read & follow direction   0 0 0  Write a sentence   0 0 0  Copy design   0 0 0  Total score   19 20 18         11/02/2022    4:13 PM 10/31/2021   11:24 AM  6CIT Screen  What Year? 0 points 0 points  What month? 0 points 0 points  What time? 0 points 0 points  Count back from 20 0 points 0 points  Months in reverse 0 points 0 points  Repeat phrase 0 points 2 points  Total Score 0 points 2 points    Immunizations Immunization History  Administered Date(s) Administered   COVID-19, mRNA, vaccine(Comirnaty)12 years and older 05/05/2022   Fluad Quad(high Dose 65+) 05/04/2022   Influenza, High Dose Seasonal PF 05/04/2021   Influenza,inj,Quad PF,6+ Mos 04/22/2013, 04/28/2014, 05/07/2015, 04/11/2016, 05/22/2017, 05/16/2018   Influenza-Unspecified 06/16/2019, 04/15/2020   Moderna Covid-19 Vaccine Bivalent Booster 11yrs & up 05/05/2021   Moderna Sars-Covid-2 Vaccination 11/06/2019, 12/04/2019, 07/07/2020   PNEUMOCOCCAL CONJUGATE-20 03/27/2022   Pneumococcal Polysaccharide-23 05/30/2006   Tdap 11/19/2009, 04/15/2020    TDAP status: Up to date  Flu Vaccine status: Up to date  Pneumococcal vaccine status: Up to date  Covid-19 vaccine status: Completed vaccines  Qualifies for Shingles Vaccine? Yes   Zostavax completed No   Shingrix Completed?: No.    Education has been provided regarding the importance of this vaccine. Patient has been advised to call insurance company to determine out of pocket expense if they have not yet received this vaccine. Advised may also receive vaccine at local pharmacy or Health Dept. Verbalized acceptance and understanding.  Screening Tests Health Maintenance  Topic Date Due   Zoster Vaccines- Shingrix (1 of 2) Never done   Fecal DNA (Cologuard)  09/21/2020   COVID-19 Vaccine (6 - 2023-24  season) 06/30/2022   Diabetic kidney evaluation - eGFR measurement  12/13/2022   Diabetic kidney evaluation - Urine ACR  12/13/2022   HEMOGLOBIN A1C  02/26/2023   OPHTHALMOLOGY EXAM  04/20/2023   FOOT EXAM  08/29/2023   Medicare Annual Wellness (AWV)  11/02/2023   DTaP/Tdap/Td (3 - Td or Tdap) 04/15/2030   Pneumonia Vaccine 65+  Years old  Completed   INFLUENZA VACCINE  Completed   Hepatitis C Screening  Completed   HIV Screening  Completed   HPV VACCINES  Aged Out    Health Maintenance  Health Maintenance Due  Topic Date Due   Zoster Vaccines- Shingrix (1 of 2) Never done   Fecal DNA (Cologuard)  09/21/2020   COVID-19 Vaccine (6 - 2023-24 season) 06/30/2022    Colorectal cancer screening: Type of screening: Cologuard. Completed 09/21/2017. Repeat every 3 years (Results were positive; colonoscopy consult ordered.  Patient will discuss at next appointment.)   Lung Cancer Screening: (Low Dose CT Chest recommended if Age 15-80 years, 30 pack-year currently smoking OR have quit w/in 15years.) does not qualify.   Lung Cancer Screening Referral: no  Additional Screening:  Hepatitis C Screening: does qualify; Completed 07/22/2013  Vision Screening: Recommended annual ophthalmology exams for early detection of glaucoma and other disorders of the eye. Is the patient up to date with their annual eye exam?  Yes  Who is the provider or what is the name of the office in which the patient attends annual eye exams? [facility] If pt is not established with a provider, would they like to be referred to a provider to establish care? No .   Dental Screening: Recommended annual dental exams for proper oral hygiene  Community Resource Referral / Chronic Care Management: CRR required this visit?  No   CCM required this visit?  No      Plan:     I have personally reviewed and noted the following in the patient's chart:   Medical and social history Use of alcohol, tobacco or illicit  drugs  Current medications and supplements including opioid prescriptions. Patient is currently taking opioid prescriptions. Information provided to patient regarding non-opioid alternatives. Patient advised to discuss non-opioid treatment plan with their provider. Functional ability and status Nutritional status Physical activity Advanced directives List of other physicians Hospitalizations, surgeries, and ER visits in previous 12 months Vitals Screenings to include cognitive, depression, and falls Referrals and appointments  In addition, I have reviewed and discussed with patient certain preventive protocols, quality metrics, and best practice recommendations. A written personalized care plan for preventive services as well as general preventive health recommendations were provided to patient.     Sheral Flow, LPN   [id number]   Nurse Notes:  Normal cognitive status assessed by telephone observation by this Nurse Health Advisor . No abnormalities found.

## 2022-11-02 NOTE — Patient Instructions (Signed)
Mark [name] , Thank you for taking time to come for your Medicare Wellness Visit. I appreciate your ongoing commitment to your health goals. Please review the following plan we discussed and let me know if I can assist you in the future.   These are the goals we discussed:  Goals      Client understands the importance of follow-up with providers by attending scheduled visits        This is a list of the screening recommended for you and due dates:  Health Maintenance  Topic Date Due   Zoster (Shingles) Vaccine (1 of 2) Never done   Cologuard (Stool DNA test)  09/21/2020   COVID-19 Vaccine (6 - 2023-24 season) 06/30/2022   Yearly kidney function blood test for diabetes  12/13/2022   Yearly kidney health urinalysis for diabetes  12/13/2022   Hemoglobin A1C  02/26/2023   Eye exam for diabetics  04/20/2023   Complete foot exam   08/29/2023   Medicare Annual Wellness Visit  11/02/2023   DTaP/Tdap/Td vaccine (3 - Td or Tdap) 04/15/2030   Pneumonia Vaccine  Completed   Flu Shot  Completed   Hepatitis C Screening: USPSTF Recommendation to screen - Ages 73-79 yo.  Completed   HIV Screening  Completed   HPV Vaccine  Aged Out    Advanced directives: No  Conditions/risks identified: Yes  Next appointment: Follow up in one year for your annual wellness visit.   Preventive Care 66 Years and Older, Male  Preventive care refers to lifestyle choices and visits with your health care provider that can promote health and wellness. What does preventive care include? A yearly physical exam. This is also called an annual well check. Dental exams once or twice a year. Routine eye exams. Ask your health care provider how often you should have your eyes checked. Personal lifestyle choices, including: Daily care of your teeth and gums. Regular physical activity. Eating a healthy diet. Avoiding tobacco and drug use. Limiting alcohol use. Practicing safe sex. Taking low doses of aspirin every  day. Taking vitamin and mineral supplements as recommended by your health care provider. What happens during an annual well check? The services and screenings done by your health care provider during your annual well check will depend on your age, overall health, lifestyle risk factors, and family history of disease. Counseling  Your health care provider may ask you questions about your: Alcohol use. Tobacco use. Drug use. Emotional well-being. Home and relationship well-being. Sexual activity. Eating habits. History of falls. Memory and ability to understand (cognition). Work and work Statistician. Screening  You may have the following tests or measurements: Height, weight, and BMI. Blood pressure. Lipid and cholesterol levels. These may be checked every 5 years, or more frequently if you are over 31 years old. Skin check. Lung cancer screening. You may have this screening every year starting at age 23 if you have a 30-pack-year history of smoking and currently smoke or have quit within the past 15 years. Fecal occult blood test (FOBT) of the stool. You may have this test every year starting at age 40. Flexible sigmoidoscopy or colonoscopy. You may have a sigmoidoscopy every 5 years or a colonoscopy every 10 years starting at age 83. Prostate cancer screening. Recommendations will vary depending on your family history and other risks. Hepatitis C blood test. Hepatitis B blood test. Sexually transmitted disease (STD) testing. Diabetes screening. This is done by checking your blood sugar (glucose) after you have not eaten  for a while (fasting). You may have this done every 1-3 years. Abdominal aortic aneurysm (AAA) screening. You may need this if you are a current or former smoker. Osteoporosis. You may be screened starting at age 66 if you are at high risk. Talk with your health care provider about your test results, treatment options, and if necessary, the need for more  tests. Vaccines  Your health care provider may recommend certain vaccines, such as: Influenza vaccine. This is recommended every year. Tetanus, diphtheria, and acellular pertussis (Tdap, Td) vaccine. You may need a Td booster every 10 years. Zoster vaccine. You may need this after age 66. Pneumococcal 13-valent conjugate (PCV13) vaccine. One dose is recommended after age 33. Pneumococcal polysaccharide (PPSV23) vaccine. One dose is recommended after age 58. Talk to your health care provider about which screenings and vaccines you need and how often you need them. This information is not intended to replace advice given to you by your health care provider. Make sure you discuss any questions you have with your health care provider. Document Released: 08/20/2015 Document Revised: 04/12/2016 Document Reviewed: 05/25/2015 Elsevier Interactive Patient Education  2017 [place] Prevention in the Home Falls can cause injuries. They can happen to people of all ages. There are many things you can do to make your home safe and to help prevent falls. What can I do on the outside of my home? Regularly fix the edges of walkways and driveways and fix any cracks. Remove anything that might make you trip as you walk through a door, such as a raised step or threshold. Trim any bushes or trees on the path to your home. Use bright outdoor lighting. Clear any walking paths of anything that might make someone trip, such as rocks or tools. Regularly check to see if handrails are loose or broken. Make sure that both sides of any steps have handrails. Any raised decks and porches should have guardrails on the edges. Have any leaves, snow, or ice cleared regularly. Use sand or salt on walking paths during winter. Clean up any spills in your garage right away. This includes oil or grease spills. What can I do in the bathroom? Use night lights. Install grab bars by the toilet and in the tub and shower.  Do not use towel bars as grab bars. Use non-skid mats or decals in the tub or shower. If you need to sit down in the shower, use a plastic, non-slip stool. Keep the floor dry. Clean up any water that spills on the floor as soon as it happens. Remove soap buildup in the tub or shower regularly. Attach bath mats securely with double-sided non-slip rug tape. Do not have throw rugs and other things on the floor that can make you trip. What can I do in the bedroom? Use night lights. Make sure that you have a light by your bed that is easy to reach. Do not use any sheets or blankets that are too big for your bed. They should not hang down onto the floor. Have a firm chair that has side arms. You can use this for support while you get dressed. Do not have throw rugs and other things on the floor that can make you trip. What can I do in the kitchen? Clean up any spills right away. Avoid walking on wet floors. Keep items that you use a lot in easy-to-reach places. If you need to reach something above you, use a strong step stool that has  a grab bar. Keep electrical cords out of the way. Do not use floor polish or wax that makes floors slippery. If you must use wax, use non-skid floor wax. Do not have throw rugs and other things on the floor that can make you trip. What can I do with my stairs? Do not leave any items on the stairs. Make sure that there are handrails on both sides of the stairs and use them. Fix handrails that are broken or loose. Make sure that handrails are as long as the stairways. Check any carpeting to make sure that it is firmly attached to the stairs. Fix any carpet that is loose or worn. Avoid having throw rugs at the top or bottom of the stairs. If you do have throw rugs, attach them to the floor with carpet tape. Make sure that you have a light switch at the top of the stairs and the bottom of the stairs. If you do not have them, ask someone to add them for you. What else  can I do to help prevent falls? Wear shoes that: Do not have high heels. Have rubber bottoms. Are comfortable and fit you well. Are closed at the toe. Do not wear sandals. If you use a stepladder: Make sure that it is fully opened. Do not climb a closed stepladder. Make sure that both sides of the stepladder are locked into place. Ask someone to hold it for you, if possible. Clearly mark and make sure that you can see: Any grab bars or handrails. First and last steps. Where the edge of each step is. Use tools that help you move around (mobility aids) if they are needed. These include: Canes. Walkers. Scooters. Crutches. Turn on the lights when you go into a dark area. Replace any light bulbs as soon as they burn out. Set up your furniture so you have a clear path. Avoid moving your furniture around. If any of your floors are uneven, fix them. If there are any pets around you, be aware of where they are. Review your medicines with your doctor. Some medicines can make you feel dizzy. This can increase your chance of falling. Ask your doctor what other things that you can do to help prevent falls. This information is not intended to replace advice given to you by your health care provider. Make sure you discuss any questions you have with your health care provider. Document Released: 05/20/2009 Document Revised: 12/30/2015 Document Reviewed: 08/28/2014 Elsevier Interactive Patient Education  2017 [employer].

## 2022-11-28 ENCOUNTER — Ambulatory Visit: Payer: Medicare Other | Attending: Internal Medicine | Admitting: Internal Medicine

## 2022-11-28 ENCOUNTER — Telehealth: Payer: Self-pay

## 2022-11-28 ENCOUNTER — Encounter: Payer: Self-pay | Admitting: Internal Medicine

## 2022-11-28 VITALS — BP 138/60 | HR 82 | Ht 67.0 in | Wt 224.0 lb

## 2022-11-28 DIAGNOSIS — I495 Sick sinus syndrome: Secondary | ICD-10-CM

## 2022-11-28 NOTE — Telephone Encounter (Signed)
Called [facility] to release patient in Carelink for remote monitoring. New patient of Dr. Lubertha Basque. Patient already has a remote monitor that is plugged in beside his bed.   Spoke to [place] who states she will send Norwood Levo a message to release pt.   [facility] [address] [place] , [place] [address] CONTACT Percell [place] PHONE NUMBER [phone]  Routing to CMA pool for f/u. Patient will also need remotes scheduled after transferred into clinic.

## 2022-11-28 NOTE — Patient Instructions (Signed)
Medication Instructions:  Your physician recommends that you continue on your current medications as directed. Please refer to the Current Medication list given to you today.  *If you need a refill on your cardiac medications before your next appointment, please call your pharmacy*   Lab Work: NONE   If you have labs (blood work) drawn today and your tests are completely normal, you will receive your results only by: MyChart Message (if you have MyChart) OR A paper copy in the mail If you have any lab test that is abnormal or we need to change your treatment, we will call you to review the results.   Testing/Procedures: NONE    Follow-Up: At [facility], you and your health needs are our priority.  As part of our continuing mission to provide you with exceptional heart care, we have created designated Provider Care Teams.  These Care Teams include your primary Cardiologist (physician) and Advanced Practice Providers (APPs -  Physician Assistants and Nurse Practitioners) who all work together to provide you with the care you need, when you need it.  We recommend signing up for the patient portal called "MyChart".  Sign up information is provided on this After Visit Summary.  MyChart is used to connect with patients for Virtual Visits (Telemedicine).  Patients are able to view lab/test results, encounter notes, upcoming appointments, etc.  Non-urgent messages can be sent to your provider as well.   To learn more about what you can do with MyChart, go to [email or web address].    Your next appointment:   1 year(s)  Provider:   Gregg Taylor, MD    Other Instructions Thank you for choosing [facility]!    

## 2022-11-28 NOTE — Progress Notes (Signed)
HPI Mr .Mark [name] is referred for evaluation and management of his PPM. He has sinus node dysfunction and had folowed at [facility]. He lives in [place] and has DM and HTN. He feels well. He admits to dietary indiscretion.  Allergies  Allergen Reactions   Ciprofloxacin Other (See Comments)    PANIC ATTACKS (Fluoroquinolones)    Losartan Other (See Comments)    Muscle pain, difficulty sleeping, anxiety, nausea   Metformin Other (See Comments)    Bad acid reflux   Buprenorphine Hcl Nausea Only   Morphine And Related Nausea Only   Penicillins Rash    Rash Has patient had a PCN reaction causing immediate rash, facial/tongue/throat swelling, SOB or lightheadedness with hypotension: Unknown Has patient had a PCN reaction causing severe rash involving mucus membranes or skin necrosis: Unknown Has patient had a PCN reaction that required hospitalization: No Has patient had a PCN reaction occurring within the last 10 years: No CHILDHOOD REACTION. If all of the above answers are "NO", then may proceed with Cephalosporin use.      Current Outpatient Medications  Medication Sig Dispense Refill   amLODipine (NORVASC) 2.5 MG tablet TAKE 1 TABLET BY MOUTH DAILY (in place of losartan) 90 tablet 0   Ascorbic Acid (VITAMIN C) 1000 MG tablet Take 1,000 mg by mouth daily.     aspirin 81 MG tablet Take 81 mg by mouth daily.     atorvastatin (LIPITOR) 20 MG tablet Take 1 tablet (20 mg total) by mouth at bedtime. 90 tablet 1   B Complex-C (SUPER B COMPLEX/VITAMIN C PO) Take by mouth daily.     cetirizine (ZYRTEC) 10 MG tablet Take 10 mg by mouth at bedtime.     Cholecalciferol (VITAMIN D3 PO) Take 2,000 Int'l Units by mouth daily.     Cyanocobalamin (VITAMIN B12 PO) Take by mouth daily.     cyclobenzaprine (FLEXERIL) 10 MG tablet TAKE ONE-HALF TO ONE TABLET BY MOUTH 2 TIMES DAILY AS NEEDED FOR MUSCLE SPASMS 40 tablet 0   ferrous sulfate 325 (65 FE) MG EC tablet Take 325 mg by mouth daily with breakfast.      FLUoxetine (PROZAC) 20 MG capsule Take 1 capsule (20 mg total) by mouth daily. 90 capsule 3   FLUoxetine (PROZAC) 40 MG capsule Take 1 capsule (40 mg total) by mouth daily. 90 capsule 3   fluticasone (FLONASE) 50 MCG/ACT nasal spray Place 2 sprays into both nostrils daily. 48 g 1   furosemide (LASIX) 20 MG tablet Take 1 tablet (20 mg total) by mouth daily. 90 tablet 4   Garlic 100 MG TABS Take by mouth daily.     metFORMIN (GLUCOPHAGE-XR) 500 MG 24 hr tablet Take 1 tablet (500 mg total) by mouth daily with breakfast. 90 tablet 2   Multiple Vitamins-Minerals (CERTAVITE SENIOR/ANTIOXIDANT) TABS Take 1 tablet by mouth daily.     naloxone (NARCAN) nasal spray 4 mg/0.1 mL Place 1 spray into the nose as needed for up to 365 doses (for opioid-induced respiratory depresssion). In case of emergency (overdose), spray once into each nostril. If no response within 3 minutes, repeat application and call 911. 1 each 0   naproxen (NAPROSYN) 500 MG tablet TAKE 1 TABLET BY MOUTH TWICE DAILY AS NEEDED FOR MODERATE PAIN 90 tablet 0   Omega-3 Fatty Acids (FISH OIL) 1000 MG CAPS Take by mouth daily.     omeprazole (PRILOSEC) 20 MG capsule Take 1 capsule (20 mg total) by mouth daily. 90 capsule  3   Oxycodone HCl 10 MG TABS Take 1 tablet (10 mg total) by mouth every 8 (eight) hours as needed. Each refill must last 30 days. 90 tablet 0   [START ON 12/04/2022] Oxycodone HCl 10 MG TABS Take 1 tablet (10 mg total) by mouth every 8 (eight) hours as needed. Each refill must last 30 days. 90 tablet 0   [START ON 01/03/2023] Oxycodone HCl 10 MG TABS Take 1 tablet (10 mg total) by mouth every 8 (eight) hours as needed. Each refill must last 30 days. 90 tablet 0   potassium citrate (UROCIT-K) 10 MEQ (1080 MG) SR tablet Take 1 tablet (10 mEq total) by mouth 2 (two) times daily. 180 tablet 3   rOPINIRole (REQUIP) 2 MG tablet Take 1 tablet (2 mg total) by mouth at bedtime. 90 tablet 3   No current facility-administered medications for  this visit.     Past Medical History:  Diagnosis Date   Anxiety    Arthritis    Chronic pain    Depression    Heart murmur    History of kidney stones 11/17/2015   Removed two kidney stones about 1 cm in size   Hydrocele in adult 05/07/2015   Hypertension    Kidney stones    Migraine    MRSA carrier 2019   Presence of permanent cardiac pacemaker    Medtronic implanted 2009 for sinus node dysfunction: generator change 2019   Prostatitis    Sleep apnea    Does not wear CPAP    ROS:   All systems reviewed and negative except as noted in the HPI.   Past Surgical History:  Procedure Laterality Date   CARDIOVASCULAR STRESS TEST  2014   Dr Lady Gary   CARPAL TUNNEL RELEASE Bilateral 2006   CERVICAL SPINE SURGERY  2008   C5/6   CHOLECYSTECTOMY N/A 10/27/2020   Procedure: LAPAROSCOPIC CHOLECYSTECTOMY;  Surgeon: [name], MD;  Location: [facility] OR;  Service: General;  Laterality: N/A;   COLONOSCOPY  07/2007   prostate nodule, hemorrhoid, rpt 5 yrs Mechele Collin)   ERCP N/A 10/26/2020   Procedure: ENDOSCOPIC RETROGRADE CHOLANGIOPANCREATOGRAPHY (ERCP);  Surgeon: Jeani Hawking, MD;  Location: [facility] ENDOSCOPY;  Service: Endoscopy;  Laterality: N/A;  choledocholithiasis   ESOPHAGOGASTRODUODENOSCOPY (EGD) WITH PROPOFOL N/A 10/26/2020   Procedure: ESOPHAGOGASTRODUODENOSCOPY (EGD) WITH PROPOFOL;  Surgeon: Jeani Hawking, MD;  Location: [facility] ENDOSCOPY;  Service: Endoscopy;  Laterality: N/A;   EUS  10/26/2020   Procedure: UPPER ENDOSCOPIC ULTRASOUND (EUS) LINEAR;  Surgeon: Jeani Hawking, MD;  Location: [facility] ENDOSCOPY;  Service: Endoscopy;;   IMPLANTABLE CARDIOVERTER DEFIBRILLATOR (ICD) GENERATOR CHANGE Left 12/13/2017   Procedure: PACER CHANGE OUT;  Surgeon: Marcina Millard, MD;  Location: [facility] ORS;  Service: Cardiovascular;  Laterality: Left;   LAPAROSCOPIC CHOLECYSTECTOMY  10/2020   LITHOTRIPSY Bilateral 2012   Stoioff   LITHOTRIPSY Left 11/2015   x2 Stoioff   pace maker revision  12/13/2017    PACEMAKER PLACEMENT  2009   s/p Medtronic Adapta DR Pacemaker (Dr. Lady Gary)   REMOVAL OF STONES  10/26/2020   Procedure: REMOVAL OF STONES;  Surgeon: Jeani Hawking, MD;  Location: [facility] ENDOSCOPY;  Service: Endoscopy;;   ROTATOR CUFF REPAIR Left 2009   SPHINCTEROTOMY  10/26/2020   Procedure: SPHINCTEROTOMY;  Surgeon: Jeani Hawking, MD;  Location: [facility] ENDOSCOPY;  Service: Endoscopy;;   TONSILLECTOMY  1964   ULNAR NERVE REPAIR  2008   Left     Family History  Problem Relation Age of Onset   Heart disease  Mother    Diabetes Mother    Arthritis Mother    Cancer Mother        Breast Cancer   Heart block Father    Heart disease Father    Arthritis Father    Cancer Father        skin cancer     Social History   Socioeconomic History   Marital status: Divorced    Spouse name: Not on file   Number of children: Not on file   Years of education: Not on file   Highest education level: Not on file  Occupational History   Not on file  Tobacco Use   Smoking status: Never   Smokeless tobacco: Never  Vaping Use   Vaping Use: Never used  Substance and Sexual Activity   Alcohol use: No   Drug use: No   Sexual activity: Never  Other Topics Concern   Not on file  Social History Narrative   Divorced. Lives with wife.   Edu: 8th grade   Occ: retired, disability for chronic neck pain    Activity: no regular exercise - limited by back and knee pain   Diet: poor water, lots of tang and gatorade, no vegetables or fruits   Social Determinants of Health   Financial Resource Strain: Low Risk  (11/02/2022)   Overall Financial Resource Strain (CARDIA)    Difficulty of Paying Living Expenses: Not hard at all  Food Insecurity: No Food Insecurity (11/02/2022)   Hunger Vital Sign    Worried About Running Out of Food in the Last Year: Never true    Ran Out of Food in the Last Year: Never true  Transportation Needs: No Transportation Needs (11/02/2022)   PRAPARE - Scientist, research (physical sciences) (Medical): No    Lack of Transportation (Non-Medical): No  Physical Activity: Sufficiently Active (11/02/2022)   Exercise Vital Sign    Days of Exercise per Week: 7 days    Minutes of Exercise per Session: 30 min  Stress: No Stress Concern Present (11/02/2022)   [employer] of Occupational Health - Occupational Stress Questionnaire    Feeling of Stress : Not at all  Social Connections: Socially Isolated (11/02/2022)   Social Connection and Isolation Panel [NHANES]    Frequency of Communication with Friends and Family: Twice a week    Frequency of Social Gatherings with Friends and Family: Never    Attends Religious Services: Never    Database administrator or Organizations: No    Attends Banker Meetings: Never    Marital Status: Divorced  Catering manager Violence: Not At Risk (11/02/2022)   Humiliation, Afraid, Rape, and Kick questionnaire    Fear of Current or Ex-Partner: No    Emotionally Abused: No    Physically Abused: No    Sexually Abused: No     BP 138/60   Pulse 82   Ht  (1.702 m)   Wt 224 lb (101.6 kg)   SpO2 97%   BMI 35.08 kg/m   Physical Exam:  Well appearing NAD HEENT: Unremarkable Neck:  No JVD, no thyromegally Lymphatics:  No adenopathy Back:  No CVA tenderness Lungs:  Clear with no wheezes HEART:  Regular rate rhythm, no murmurs, no rubs, no clicks Abd:  soft, positive bowel sounds, no organomegally, no rebound, no guarding Ext:  2 plus pulses, no edema, no cyanosis, no clubbing Skin:  No rashes no nodules Neuro:  CN II through XII intact,  motor grossly intact  DEVICE  Normal device function.  See PaceArt for details.   Assess/Plan: Sinus node dysfunction - He is asymptomatic s/p PPM insertion.  PPM -his medtronic DDD PM is working normally. HTN - his bp is controlled fairly well looking at his 3 month trends.   Sharlot Gowda Odena Mcquaid,MD

## 2022-12-01 NOTE — Telephone Encounter (Signed)
Pt is in our Carelink system and on a 91 day schedule

## 2022-12-11 DIAGNOSIS — N521 Erectile dysfunction due to diseases classified elsewhere: Secondary | ICD-10-CM | POA: Diagnosis not present

## 2022-12-11 DIAGNOSIS — F3342 Major depressive disorder, recurrent, in full remission: Secondary | ICD-10-CM | POA: Diagnosis not present

## 2022-12-16 ENCOUNTER — Other Ambulatory Visit: Payer: Self-pay | Admitting: Family Medicine

## 2022-12-16 DIAGNOSIS — D509 Iron deficiency anemia, unspecified: Secondary | ICD-10-CM

## 2022-12-16 DIAGNOSIS — N138 Other obstructive and reflux uropathy: Secondary | ICD-10-CM

## 2022-12-16 DIAGNOSIS — E1169 Type 2 diabetes mellitus with other specified complication: Secondary | ICD-10-CM

## 2022-12-18 ENCOUNTER — Other Ambulatory Visit (INDEPENDENT_AMBULATORY_CARE_PROVIDER_SITE_OTHER): Payer: Medicare Other

## 2022-12-18 DIAGNOSIS — D509 Iron deficiency anemia, unspecified: Secondary | ICD-10-CM

## 2022-12-18 DIAGNOSIS — E785 Hyperlipidemia, unspecified: Secondary | ICD-10-CM | POA: Diagnosis not present

## 2022-12-18 DIAGNOSIS — E1169 Type 2 diabetes mellitus with other specified complication: Secondary | ICD-10-CM

## 2022-12-18 DIAGNOSIS — N401 Enlarged prostate with lower urinary tract symptoms: Secondary | ICD-10-CM | POA: Diagnosis not present

## 2022-12-18 DIAGNOSIS — N138 Other obstructive and reflux uropathy: Secondary | ICD-10-CM | POA: Diagnosis not present

## 2022-12-18 LAB — LIPID PANEL
Cholesterol: 114 mg/dL (ref 0–200)
HDL: 40.3 mg/dL (ref >39.00)
LDL Cholesterol: 44 mg/dL (ref 0–99)
NonHDL: 73.35
Total CHOL/HDL Ratio: 3
Triglycerides: 145 mg/dL (ref 0.0–149.0)
VLDL: 29 mg/dL (ref 0.0–40.0)

## 2022-12-18 LAB — COMPREHENSIVE METABOLIC PANEL
ALT: 40 U/L (ref 0–53)
AST: 31 U/L (ref 0–37)
Albumin: 3.9 g/dL (ref 3.5–5.2)
Alkaline Phosphatase: 78 U/L (ref 39–117)
BUN: 24 mg/dL — ABNORMAL HIGH (ref 6–23)
CO2: 27 mEq/L (ref 19–32)
Calcium: 9.1 mg/dL (ref 8.4–10.5)
Chloride: 100 mEq/L (ref 96–112)
Creatinine, Ser: 1.02 mg/dL (ref 0.40–1.50)
GFR: 76.89 mL/min (ref >60.00)
Glucose, Bld: 146 mg/dL — ABNORMAL HIGH (ref 70–99)
Potassium: 3.9 mEq/L (ref 3.5–5.1)
Sodium: 138 mEq/L (ref 135–145)
Total Bilirubin: 0.7 mg/dL (ref 0.2–1.2)
Total Protein: 6.2 g/dL (ref 6.0–8.3)

## 2022-12-18 LAB — CBC WITH DIFFERENTIAL/PLATELET
Basophils Absolute: 0.1 10*3/uL (ref 0.0–0.1)
Basophils Relative: 1.1 % (ref 0.0–3.0)
Eosinophils Absolute: 0.2 10*3/uL (ref 0.0–0.7)
Eosinophils Relative: 2.3 % (ref 0.0–5.0)
HCT: 41.8 % (ref 39.0–52.0)
Hemoglobin: 14.6 g/dL (ref 13.0–17.0)
Lymphocytes Relative: 21.5 % (ref 12.0–46.0)
Lymphs Abs: 1.7 10*3/uL (ref 0.7–4.0)
MCHC: 35 g/dL (ref 30.0–36.0)
MCV: 88.7 fl (ref 78.0–100.0)
Monocytes Absolute: 0.9 10*3/uL (ref 0.1–1.0)
Monocytes Relative: 11.6 % (ref 3.0–12.0)
Neutro Abs: 5 10*3/uL (ref 1.4–7.7)
Neutrophils Relative %: 63.5 % (ref 43.0–77.0)
Platelets: 205 10*3/uL (ref 150.0–400.0)
RBC: 4.72 Mil/uL (ref 4.22–5.81)
RDW: 13 % (ref 11.5–15.5)
WBC: 7.8 10*3/uL (ref 4.0–10.5)

## 2022-12-18 LAB — IBC PANEL
Iron: 103 ug/dL (ref 42–165)
Saturation Ratios: 37.5 % (ref 20.0–50.0)
TIBC: 274.4 ug/dL (ref 250.0–450.0)
Transferrin: 196 mg/dL — ABNORMAL LOW (ref 212.0–360.0)

## 2022-12-18 LAB — MICROALBUMIN / CREATININE URINE RATIO
Creatinine,U: 42 mg/dL
Microalb Creat Ratio: 1.7 mg/g (ref 0.0–30.0)
Microalb, Ur: <0.7 mg/dL (ref 0.0–1.9)

## 2022-12-18 LAB — VITAMIN B12: Vitamin B-12: 790 pg/mL (ref 211–911)

## 2022-12-18 LAB — HEMOGLOBIN A1C: Hgb A1c MFr Bld: 5.9 % (ref 4.6–6.5)

## 2022-12-18 LAB — PSA: PSA: 0.91 ng/mL (ref 0.10–4.00)

## 2022-12-18 LAB — FERRITIN: Ferritin: 411.4 ng/mL — ABNORMAL HIGH (ref 22.0–322.0)

## 2022-12-19 ENCOUNTER — Ambulatory Visit (INDEPENDENT_AMBULATORY_CARE_PROVIDER_SITE_OTHER): Payer: Medicare Other

## 2022-12-19 ENCOUNTER — Telehealth: Payer: Self-pay

## 2022-12-19 DIAGNOSIS — I495 Sick sinus syndrome: Secondary | ICD-10-CM | POA: Diagnosis not present

## 2022-12-19 NOTE — Progress Notes (Signed)
Care Management & Coordination Services Pharmacy Team  Reason for Encounter: Medication adherence  Patient is at risk for failing the Medicare Adherence STAR measure for the following medication:  Atorvastatin 20 mg  Pharmacy fill dates/day supply this year: 11/30/2022 90ds per [facility]  Patient is not more than 5 days past due for refill of the above medication.  [name], PharmD notified  Claudina Lick, [place] Clinical Pharmacy Assistant [phone]

## 2022-12-20 ENCOUNTER — Other Ambulatory Visit: Payer: Self-pay | Admitting: Urology

## 2022-12-20 LAB — CUP PACEART REMOTE DEVICE CHECK
Battery Remaining Longevity: 103 mo
Battery Voltage: 2.99 V
Brady Statistic AP VP Percent: 0.44 %
Brady Statistic AP VS Percent: 98.91 %
Brady Statistic AS VP Percent: 0 %
Brady Statistic AS VS Percent: 0.65 %
Brady Statistic RA Percent Paced: 99.8 %
Brady Statistic RV Percent Paced: 0.44 %
Date Time Interrogation Session: 20240514103733
Implantable Lead Connection Status: 753985
Implantable Lead Connection Status: 753985
Implantable Lead Implant Date: 20190509
Implantable Lead Implant Date: 20190509
Implantable Lead Location: 753859
Implantable Lead Location: 753862
Implantable Lead Model: 5092
Implantable Lead Model: 5592
Implantable Pulse Generator Implant Date: 20190509
Lead Channel Impedance Value: 380 Ohm
Lead Channel Impedance Value: 380 Ohm
Lead Channel Impedance Value: 380 Ohm
Lead Channel Impedance Value: 551 Ohm
Lead Channel Pacing Threshold Amplitude: 0.75 V
Lead Channel Pacing Threshold Amplitude: 0.875 V
Lead Channel Pacing Threshold Pulse Width: 0.4 ms
Lead Channel Pacing Threshold Pulse Width: 0.4 ms
Lead Channel Sensing Intrinsic Amplitude: 1.875 mV
Lead Channel Sensing Intrinsic Amplitude: 1.875 mV
Lead Channel Sensing Intrinsic Amplitude: 6.75 mV
Lead Channel Sensing Intrinsic Amplitude: 6.75 mV
Lead Channel Setting Pacing Amplitude: 1.5 V
Lead Channel Setting Pacing Amplitude: 2 V
Lead Channel Setting Pacing Pulse Width: 0.4 ms
Lead Channel Setting Sensing Sensitivity: 2 mV
Zone Setting Status: 755011
Zone Setting Status: 755011

## 2022-12-21 ENCOUNTER — Other Ambulatory Visit: Payer: Self-pay | Admitting: Family Medicine

## 2022-12-21 DIAGNOSIS — G894 Chronic pain syndrome: Secondary | ICD-10-CM

## 2022-12-21 NOTE — Telephone Encounter (Signed)
Naproxen Last filled:  10/11/22, #90 Last OV:  08/28/22, 5 mo DM f/u Next OV:  12/25/22, CPE

## 2022-12-25 ENCOUNTER — Ambulatory Visit (INDEPENDENT_AMBULATORY_CARE_PROVIDER_SITE_OTHER): Payer: Medicare Other | Admitting: Family Medicine

## 2022-12-25 ENCOUNTER — Encounter: Payer: Self-pay | Admitting: Family Medicine

## 2022-12-25 VITALS — BP 134/70 | HR 85 | Temp 97.6°F | Ht 66.5 in | Wt 218.1 lb

## 2022-12-25 DIAGNOSIS — I5032 Chronic diastolic (congestive) heart failure: Secondary | ICD-10-CM

## 2022-12-25 DIAGNOSIS — F331 Major depressive disorder, recurrent, moderate: Secondary | ICD-10-CM

## 2022-12-25 DIAGNOSIS — K219 Gastro-esophageal reflux disease without esophagitis: Secondary | ICD-10-CM | POA: Diagnosis not present

## 2022-12-25 DIAGNOSIS — Z7189 Other specified counseling: Secondary | ICD-10-CM

## 2022-12-25 DIAGNOSIS — R195 Other fecal abnormalities: Secondary | ICD-10-CM

## 2022-12-25 DIAGNOSIS — E1169 Type 2 diabetes mellitus with other specified complication: Secondary | ICD-10-CM | POA: Diagnosis not present

## 2022-12-25 DIAGNOSIS — E669 Obesity, unspecified: Secondary | ICD-10-CM

## 2022-12-25 DIAGNOSIS — I10 Essential (primary) hypertension: Secondary | ICD-10-CM

## 2022-12-25 DIAGNOSIS — D509 Iron deficiency anemia, unspecified: Secondary | ICD-10-CM

## 2022-12-25 DIAGNOSIS — G4733 Obstructive sleep apnea (adult) (pediatric): Secondary | ICD-10-CM

## 2022-12-25 DIAGNOSIS — E785 Hyperlipidemia, unspecified: Secondary | ICD-10-CM

## 2022-12-25 DIAGNOSIS — Z0000 Encounter for general adult medical examination without abnormal findings: Secondary | ICD-10-CM | POA: Insufficient documentation

## 2022-12-25 DIAGNOSIS — G2581 Restless legs syndrome: Secondary | ICD-10-CM

## 2022-12-25 DIAGNOSIS — N138 Other obstructive and reflux uropathy: Secondary | ICD-10-CM

## 2022-12-25 DIAGNOSIS — G894 Chronic pain syndrome: Secondary | ICD-10-CM

## 2022-12-25 MED ORDER — FLUOXETINE HCL 40 MG PO CAPS
40.0000 mg | ORAL_CAPSULE | Freq: Every day | ORAL | 3 refills | Status: DC
Start: 2022-12-25 — End: 2023-05-28

## 2022-12-25 MED ORDER — AMLODIPINE BESYLATE 2.5 MG PO TABS: ORAL_TABLET | ORAL | 4 refills | Status: DC

## 2022-12-25 MED ORDER — ROPINIROLE HCL 2 MG PO TABS: 2.0000 mg | ORAL_TABLET | Freq: Every day | ORAL | 4 refills | Status: DC

## 2022-12-25 MED ORDER — METFORMIN HCL ER 500 MG PO TB24: 500.0000 mg | ORAL_TABLET | Freq: Every day | ORAL | 4 refills | Status: DC

## 2022-12-25 MED ORDER — DIPHENHYDRAMINE HCL 25 MG PO TABS: 25.0000 mg | ORAL_TABLET | Freq: Two times a day (BID) | ORAL | Status: DC | PRN

## 2022-12-25 MED ORDER — OZEMPIC (0.25 OR 0.5 MG/DOSE) 2 MG/1.5ML ~~LOC~~ SOPN: 0.5000 mg | PEN_INJECTOR | SUBCUTANEOUS | 6 refills | Status: DC

## 2022-12-25 MED ORDER — FUROSEMIDE 20 MG PO TABS: 20.0000 mg | ORAL_TABLET | Freq: Every day | ORAL | 4 refills | Status: DC

## 2022-12-25 MED ORDER — OMEPRAZOLE 20 MG PO CPDR
20.0000 mg | DELAYED_RELEASE_CAPSULE | Freq: Every day | ORAL | 4 refills | Status: DC
Start: 2022-12-25 — End: 2023-05-01

## 2022-12-25 MED ORDER — OZEMPIC (0.25 OR 0.5 MG/DOSE) 2 MG/3ML ~~LOC~~ SOPN: PEN_INJECTOR | SUBCUTANEOUS | 0 refills | Status: DC

## 2022-12-25 MED ORDER — FLUTICASONE PROPIONATE 50 MCG/ACT NA SUSP: 2.0000 | Freq: Every day | NASAL | 4 refills | Status: DC

## 2022-12-25 MED ORDER — ATORVASTATIN CALCIUM 20 MG PO TABS: 20.0000 mg | ORAL_TABLET | Freq: Every day | ORAL | 4 refills | Status: DC

## 2022-12-25 MED ORDER — FLUOXETINE HCL 20 MG PO CAPS: 20.0000 mg | ORAL_CAPSULE | Freq: Every day | ORAL | 4 refills | Status: DC

## 2022-12-25 NOTE — Assessment & Plan Note (Signed)
Chronic, stable on current regimen - continue. 

## 2022-12-25 NOTE — Assessment & Plan Note (Addendum)
Chronic, stable on low-dose metformin XR which she tolerates better.  Continue. Patient is interested in medication to assist with weight loss, specifically GLP1RA. Reviewed mechanism of action of medication as well as side effects and adverse events to watch for including nausea, diarrhea, constipation, pancreatitis. No fmhx medullary thyroid cancer. Discussed titration schedule for medication. Will start ozempic 025mg  weeklky x 1 month then increase to 0.5mg  weekly.  RTC 3 mo DM f/u visit

## 2022-12-25 NOTE — Assessment & Plan Note (Addendum)
H/o severe OSA previously on BiPAP.  Has not used in years - trouble tolerating mask.  Referred to ENT last year for inspire evaluation - didn't see. Requests new referral.  Previously saw [facility] pulm Nicholos Johns)  Sleep study 07/03/17; Severe OSA with AHI of 42.7

## 2022-12-25 NOTE — Progress Notes (Signed)
Ph: [phone] Fax: [phone]   Patient ID: Mark [name], male    DOB: 1955/09/16, 67 y.o.   MRN: [id number]  This visit was conducted in person.  BP 134/70   Pulse 85   Temp 97.6 F (36.4 C) (Temporal)   Ht 5' 6.5" (1.689 m)   Wt 218 lb 2 oz (98.9 kg)   SpO2 96%   BMI 34.68 kg/m    CC: CPE Subjective:   HPI: Mark [name] is a 67 y.o. male presenting on 12/25/2022 for Annual Exam (MCR prt 2 [AWV- 11/02/22]. Pt provided log [to keep] of recent BP readings. )  Saw health advisor 10/2022 for medicare wellness visit. Note reviewed.    No results found.  Flowsheet Row Clinical Support from 11/02/2022 in [facility] [place]  PHQ-2 Total Score 0          11/02/2022    4:21 PM 11/01/2022    2:29 PM 08/28/2022    2:22 PM 07/24/2022    2:37 PM 05/01/2022    1:49 PM  Fall Risk   Falls in the past year? 0 0 0 0 0  Number falls in past yr: 0      Injury with Fall? 0      Risk for fall due to : No Fall Risks      Follow up Falls prevention discussed       HTN - brings BP log showing 120-140s/60-70s on amlodipine 10mg  daily, lasix 20mg  daily.   DM - stable period on metformin XR 500mg  daily. He would like to try medicine that can help with weight loss. Poor diet - doesn't eat fruits/vegetables  Chronic pain - manages with opiate through pain clinic as well as naprosyn 500mg  daily with breakfast.   On prozac 60mg  total.   Multiple recent kidney stones sees Dr Lonna Cobb.   S/p 2nd pacemaker 12/2017, sees cardiology Dr Ladona Ridgel and Diona Browner, last seen 11/2022.   Preventative: COLONOSCOPY 07/2007 prostate nodule, hemorrhoid, rpt 5 yrs Mechele Collin).  Cologuard positive 08/2017 - colonoscopy postponed for the past 3 years. Endorses h/o hemorrhoids. planning to schedule with [facility] f/u summer.  Prostate cancer screening with nodule - sees urology Dr Lonna Cobb, PSA through our office.  Lung cancer screening - not indicated  Flu shot yearly  COVID  vaccine Moderna 11/2019 x2, booster 07/2020, bivalent 04/2021.  Pneumovax 2007, prevnar-20 03/2022 Tdap 11/2009, 04/2020  Shingrix - completed at [facility] drug  Advanced directive discussion - working on this at home - HCPOA would be Bethann Berkshire (lifelong friend). Asked to bring [place] a copy when complete.  Seat belt use discussed  Sunscreen use discussed, no changing moles on skin.  Non smoker  Alcohol - none  Dentist q6 mo  Eye exam ~q2 yrs Bowel - no constipation Bladder - no incontinence  Divorced. GF passed away. Lives alone, mother lives next door. Edu: 8th grade Occ: retired, disability for chronic neck pain  Activity: no regular exercise - limited by back and knee pain Diet: increasing water, poor vegetables and fruits     Relevant past medical, surgical, family and social history reviewed and updated as indicated. Interim medical history since our last visit reviewed. Allergies and medications reviewed and updated. Outpatient Medications Prior to Visit  Medication Sig Dispense Refill   Ascorbic Acid (VITAMIN C) 1000 MG tablet Take 1,000 mg by mouth daily.     aspirin 81 MG tablet Take 81 mg by mouth daily.  B Complex-C (SUPER B COMPLEX/VITAMIN C PO) Take by mouth daily.     Cholecalciferol (VITAMIN D3 PO) Take 2,000 Int'l Units by mouth daily.     Cyanocobalamin (VITAMIN B12 PO) Take by mouth daily.     cyclobenzaprine (FLEXERIL) 10 MG tablet TAKE ONE-HALF TO ONE TABLET BY MOUTH 2 TIMES DAILY AS NEEDED FOR MUSCLE SPASMS 40 tablet 0   Garlic 100 MG TABS Take by mouth daily.     Multiple Vitamins-Minerals (CERTAVITE SENIOR/ANTIOXIDANT) TABS Take 1 tablet by mouth daily.     naloxone (NARCAN) nasal spray 4 mg/0.1 mL Place 1 spray into the nose as needed for up to 365 doses (for opioid-induced respiratory depresssion). In case of emergency (overdose), spray once into each nostril. If no response within 3 minutes, repeat application and call 911. 1 each 0   naproxen (NAPROSYN) 500 MG  tablet TAKE 1 TABLET BY MOUTH TWICE DAILY AS NEEDED FOR MODERATE PAIN 90 tablet 0   Omega-3 Fatty Acids (FISH OIL) 1000 MG CAPS Take by mouth daily.     Oxycodone HCl 10 MG TABS Take 1 tablet (10 mg total) by mouth every 8 (eight) hours as needed. Each refill must last 30 days. 90 tablet 0   [START ON 01/03/2023] Oxycodone HCl 10 MG TABS Take 1 tablet (10 mg total) by mouth every 8 (eight) hours as needed. Each refill must last 30 days. 90 tablet 0   potassium citrate (UROCIT-K) 10 MEQ (1080 MG) SR tablet TAKE 1 TABLET BY MOUTH TWICE DAILY 180 tablet 1   amLODipine (NORVASC) 2.5 MG tablet TAKE 1 TABLET BY MOUTH DAILY (in place of losartan) 90 tablet 0   atorvastatin (LIPITOR) 20 MG tablet Take 1 tablet (20 mg total) by mouth at bedtime. 90 tablet 1   cetirizine (ZYRTEC) 10 MG tablet Take 10 mg by mouth at bedtime.     ferrous sulfate 325 (65 FE) MG EC tablet Take 325 mg by mouth daily with breakfast.     FLUoxetine (PROZAC) 20 MG capsule Take 1 capsule (20 mg total) by mouth daily. 90 capsule 3   FLUoxetine (PROZAC) 40 MG capsule Take 1 capsule (40 mg total) by mouth daily. 90 capsule 3   fluticasone (FLONASE) 50 MCG/ACT nasal spray Place 2 sprays into both nostrils daily. 48 g 1   furosemide (LASIX) 20 MG tablet Take 1 tablet (20 mg total) by mouth daily. 90 tablet 4   metFORMIN (GLUCOPHAGE-XR) 500 MG 24 hr tablet Take 1 tablet (500 mg total) by mouth daily with breakfast. 90 tablet 2   omeprazole (PRILOSEC) 20 MG capsule Take 1 capsule (20 mg total) by mouth daily. 90 capsule 3   rOPINIRole (REQUIP) 2 MG tablet Take 1 tablet (2 mg total) by mouth at bedtime. 90 tablet 3   Oxycodone HCl 10 MG TABS Take 1 tablet (10 mg total) by mouth every 8 (eight) hours as needed. Each refill must last 30 days. 90 tablet 0   No facility-administered medications prior to visit.     Per HPI unless specifically indicated in ROS section below Review of Systems  Constitutional:  Negative for activity change,  appetite change, chills, fatigue, fever and unexpected weight change.  HENT:  Negative for hearing loss.   Eyes:  Negative for visual disturbance.  Respiratory:  Negative for cough, chest tightness, shortness of breath and wheezing.   Cardiovascular:  Positive for leg swelling. Negative for chest pain and palpitations.  Gastrointestinal:  Negative for abdominal distention, abdominal pain,  blood in stool, constipation, diarrhea, nausea and vomiting.  Genitourinary:  Negative for difficulty urinating and hematuria.  Musculoskeletal:  Negative for arthralgias, myalgias and neck pain.  Skin:  Negative for rash.  Neurological:  Negative for dizziness, seizures, syncope and headaches.  Hematological:  Negative for adenopathy. Does not bruise/bleed easily.  Psychiatric/Behavioral:  Negative for dysphoric mood. The patient is not nervous/anxious.     Objective:  BP 134/70   Pulse 85   Temp 97.6 F (36.4 C) (Temporal)   Ht 5' 6.5" (1.689 m)   Wt 218 lb 2 oz (98.9 kg)   SpO2 96%   BMI 34.68 kg/m   Wt Readings from Last 3 Encounters:  12/25/22 218 lb 2 oz (98.9 kg)  11/28/22 224 lb (101.6 kg)  11/02/22 220 lb (99.8 kg)      Physical Exam Vitals and nursing note reviewed.  Constitutional:      General: He is not in acute distress.    Appearance: Normal appearance. He is well-developed. He is not ill-appearing.  HENT:     Head: Normocephalic and atraumatic.     Right Ear: Hearing, tympanic membrane, ear canal and external ear normal.     Left Ear: Hearing, tympanic membrane, ear canal and external ear normal.     Mouth/Throat:     Mouth: Mucous membranes are moist.     Pharynx: Oropharynx is clear. No oropharyngeal exudate or posterior oropharyngeal erythema.  Eyes:     General: No scleral icterus.    Extraocular Movements: Extraocular movements intact.     Conjunctiva/sclera: Conjunctivae normal.     Pupils: Pupils are equal, round, and reactive to light.  Neck:     Thyroid: No  thyroid mass or thyromegaly.  Cardiovascular:     Rate and Rhythm: Normal rate and regular rhythm.     Pulses: Normal pulses.          Radial pulses are 2+ on the right side and 2+ on the left side.     Heart sounds: No murmur heard. Pulmonary:     Effort: Pulmonary effort is normal. No respiratory distress.     Breath sounds: Normal breath sounds. No wheezing, rhonchi or rales.  Abdominal:     General: Bowel sounds are normal. There is no distension.     Palpations: Abdomen is soft. There is no mass.     Tenderness: There is no abdominal tenderness. There is no guarding or rebound.     Hernia: No hernia is present.  Musculoskeletal:        General: Normal range of motion.     Cervical back: Normal range of motion and neck supple.     Right lower leg: No edema.     Left lower leg: No edema.  Lymphadenopathy:     Cervical: No cervical adenopathy.  Skin:    General: Skin is warm and dry.     Findings: No rash.  Neurological:     General: No focal deficit present.     Mental Status: He is alert and oriented to person, place, and time.  Psychiatric:        Mood and Affect: Mood normal.        Behavior: Behavior normal.        Thought Content: Thought content normal.        Judgment: Judgment normal.    Lab Results  Component Value Date   CREATININE 1.02 12/18/2022   BUN 24 (H) 12/18/2022   NA 138 12/18/2022  K 3.9 12/18/2022   CL 100 12/18/2022   CO2 27 12/18/2022    Lab Results  Component Value Date   ALT 40 12/18/2022   AST 31 12/18/2022   ALKPHOS 78 12/18/2022   BILITOT 0.7 12/18/2022    Lab Results  Component Value Date   CHOL 114 12/18/2022   HDL 40.30 12/18/2022   LDLCALC 44 12/18/2022   LDLDIRECT 64.0 12/12/2021   TRIG 145.0 12/18/2022   CHOLHDL 3 12/18/2022    Lab Results  Component Value Date   HGBA1C 5.9 12/18/2022    Lab Results  Component Value Date   WBC 7.8 12/18/2022   HGB 14.6 12/18/2022   HCT 41.8 12/18/2022   MCV 88.7 12/18/2022    PLT 205.0 12/18/2022   Lab Results  Component Value Date   TSH 4.26 12/12/2021       Assessment & Plan:   Problem List Items Addressed This Visit     Chronic pain syndrome (Chronic)    Followed by pain clinic.       Relevant Medications   FLUoxetine (PROZAC) 40 MG capsule   FLUoxetine (PROZAC) 20 MG capsule   MDD (major depressive disorder), recurrent episode, moderate (HCC)    Chronic, stable period on prozac 60mg  daily - continue this.  He is aware of increased risk of GI bleed with NSAID+SSRI.       Relevant Medications   FLUoxetine (PROZAC) 40 MG capsule   FLUoxetine (PROZAC) 20 MG capsule   Hyperlipidemia associated with type 2 diabetes mellitus (HCC)    Chronic, stable on atorvastatin 20mg  daily - continue.  The ASCVD Risk score (Arnett DK, et al., 2019) failed to calculate for the following reasons:   The valid total cholesterol range is 130 to 320 mg/dL       Relevant Medications   amLODipine (NORVASC) 2.5 MG tablet   atorvastatin (LIPITOR) 20 MG tablet   furosemide (LASIX) 20 MG tablet   metFORMIN (GLUCOPHAGE-XR) 500 MG 24 hr tablet   Semaglutide,0.25 or 0.5MG /DOS, (OZEMPIC, 0.25 OR 0.5 MG/DOSE,) 2 MG/3ML SOPN   Semaglutide,0.25 or 0.5MG /DOS, (OZEMPIC, 0.25 OR 0.5 MG/DOSE,) 2 MG/1.5ML SOPN (Start on 01/22/2023)   Obesity, Class I, BMI 30.0-34.9 (see actual BMI)    See below - start GLP1RA ozempic      Benign prostatic hyperplasia with urinary obstruction    Sees urology. PSA stable.       Hypertension    Chronic, stable on current regimen - continue.       Relevant Medications   amLODipine (NORVASC) 2.5 MG tablet   atorvastatin (LIPITOR) 20 MG tablet   furosemide (LASIX) 20 MG tablet   OSA (obstructive sleep apnea)    H/o severe OSA previously on BiPAP.  Has not used in years - trouble tolerating mask.  Referred to ENT last year for inspire evaluation - didn't see. Requests new referral.  Previously saw [facility] pulm Nicholos Johns)  Sleep study 07/03/17;  Severe OSA with AHI of 42.7       Relevant Orders   Ambulatory referral to ENT   RLS (restless legs syndrome)    Stable on Requip 2 mg nightly.  He declines trying lower dose at this time. Iron panel largely normal, with elevated ferritin-will stop oral iron supplementation.      Relevant Medications   rOPINIRole (REQUIP) 2 MG tablet   GERD (gastroesophageal reflux disease)    Chronic, stable on omeprazole 20 mg daily-continue      Relevant Medications   omeprazole (  PRILOSEC) 20 MG capsule   IDA (iron deficiency anemia)    H/o this.  Has continued oral iron, however no longer anemic, ferritin actually high - will stop oral iron.       Positive colorectal cancer screening using Cologuard test    Positive Cologuard 2019 - overdue for colonoscopy. He plans to establish with [facility]. Encouraged he call to schedule f/u.      Chronic diastolic heart failure (HCC)    Continue lasix - seems euvolemic.       Relevant Medications   amLODipine (NORVASC) 2.5 MG tablet   atorvastatin (LIPITOR) 20 MG tablet   furosemide (LASIX) 20 MG tablet   Type 2 diabetes mellitus with other specified complication (HCC)    Chronic, stable on low-dose metformin XR which she tolerates better.  Continue. Patient is interested in medication to assist with weight loss, specifically GLP1RA. Reviewed mechanism of action of medication as well as side effects and adverse events to watch for including nausea, diarrhea, constipation, pancreatitis. No fmhx medullary thyroid cancer. Discussed titration schedule for medication. Will start ozempic 025mg  weeklky x 1 month then increase to 0.5mg  weekly.  RTC 3 mo DM f/u visit      Relevant Medications   atorvastatin (LIPITOR) 20 MG tablet   metFORMIN (GLUCOPHAGE-XR) 500 MG 24 hr tablet   Semaglutide,0.25 or 0.5MG /DOS, (OZEMPIC, 0.25 OR 0.5 MG/DOSE,) 2 MG/3ML SOPN   Semaglutide,0.25 or 0.5MG /DOS, (OZEMPIC, 0.25 OR 0.5 MG/DOSE,) 2 MG/1.5ML SOPN (Start on  01/22/2023)   Health maintenance examination - Primary (Chronic)    Preventative protocols reviewed and updated unless pt declined. Discussed healthy diet and lifestyle.       Advanced directives, counseling/discussion (Chronic)    Advanced directive discussion - working on this at home - HCPOA would be Bethann Berkshire (lifelong friend). Asked to bring [place] a copy when complete.         Meds ordered this encounter  Medications   amLODipine (NORVASC) 2.5 MG tablet    Sig: TAKE 1 TABLET BY MOUTH DAILY (in place of losartan)    Dispense:  90 tablet    Refill:  4   atorvastatin (LIPITOR) 20 MG tablet    Sig: Take 1 tablet (20 mg total) by mouth at bedtime.    Dispense:  90 tablet    Refill:  4   FLUoxetine (PROZAC) 40 MG capsule    Sig: Take 1 capsule (40 mg total) by mouth daily.    Dispense:  90 capsule    Refill:  3   fluticasone (FLONASE) 50 MCG/ACT nasal spray    Sig: Place 2 sprays into both nostrils daily.    Dispense:  48 g    Refill:  4   furosemide (LASIX) 20 MG tablet    Sig: Take 1 tablet (20 mg total) by mouth daily.    Dispense:  90 tablet    Refill:  4   metFORMIN (GLUCOPHAGE-XR) 500 MG 24 hr tablet    Sig: Take 1 tablet (500 mg total) by mouth daily with breakfast.    Dispense:  90 tablet    Refill:  4   omeprazole (PRILOSEC) 20 MG capsule    Sig: Take 1 capsule (20 mg total) by mouth daily.    Dispense:  90 capsule    Refill:  4   rOPINIRole (REQUIP) 2 MG tablet    Sig: Take 1 tablet (2 mg total) by mouth at bedtime.    Dispense:  90 tablet  Refill:  4   diphenhydrAMINE (BENADRYL ALLERGY) 25 MG tablet    Sig: Take 1 tablet (25 mg total) by mouth 2 (two) times daily as needed for allergies.   FLUoxetine (PROZAC) 20 MG capsule    Sig: Take 1 capsule (20 mg total) by mouth daily.    Dispense:  90 capsule    Refill:  4   Semaglutide,0.25 or 0.5MG /DOS, (OZEMPIC, 0.25 OR 0.5 MG/DOSE,) 2 MG/3ML SOPN    Sig: Inject 0.25 mg into the skin once a week for 28 days, THEN  0.5 mg once a week.    Dispense:  3 mL    Refill:  0   Semaglutide,0.25 or 0.5MG /DOS, (OZEMPIC, 0.25 OR 0.5 MG/DOSE,) 2 MG/1.5ML SOPN    Sig: Inject 0.5 mg into the skin once a week.    Dispense:  1.5 mL    Refill:  6    Orders Placed This Encounter  Procedures   Ambulatory referral to ENT    Referral Priority:   Routine    Referral Type:   Consultation    Referral Reason:   Specialty Services Required    Requested Specialty:   Otolaryngology    Number of Visits Requested:   1    Patient Instructions  Call GI to schedule colonoscopy in [place]. Let us know if you need referral.  Send [place] dates of shingrix shots to update your chart.  Stop extra iron. Trial ozempic 0.25mg  weekly for 1 month then increase to 0.5mg  weekly. You may schedule nurse visit for first shot if you'd like. Let me know if any trouble tolerating new medicine.  We will refer you to [facility] for [facility] device evaluation.  Return in 4 months for diabetes follow up visit.   Follow up plan: Return in about 4 months (around 04/27/2023) for follow up visit.  [name], MD

## 2022-12-25 NOTE — Assessment & Plan Note (Signed)
Positive Cologuard 2019 - overdue for colonoscopy. He plans to establish with [facility]. Encouraged he call to schedule f/u.

## 2022-12-25 NOTE — Assessment & Plan Note (Addendum)
Chronic, stable period on prozac 60mg  daily - continue this.  He is aware of increased risk of GI bleed with NSAID+SSRI.

## 2022-12-25 NOTE — Assessment & Plan Note (Addendum)
H/o this.  Has continued oral iron, however no longer anemic, ferritin actually high - will stop oral iron.

## 2022-12-25 NOTE — Assessment & Plan Note (Addendum)
Continue lasix - seems euvolemic.

## 2022-12-25 NOTE — Assessment & Plan Note (Addendum)
Stable on Requip 2 mg nightly.  He declines trying lower dose at this time. Iron panel largely normal, with elevated ferritin-will stop oral iron supplementation.

## 2022-12-25 NOTE — Assessment & Plan Note (Signed)
Chronic, stable on atorvastatin 20mg  daily - continue.  The ASCVD Risk score (Arnett DK, et al., 2019) failed to calculate for the following reasons:   The valid total cholesterol range is 130 to 320 mg/dL

## 2022-12-25 NOTE — Assessment & Plan Note (Signed)
Preventative protocols reviewed and updated unless pt declined. Discussed healthy diet and lifestyle.  

## 2022-12-25 NOTE — Assessment & Plan Note (Addendum)
Advanced directive discussion - working on this at home - HCPOA would be Bethann Berkshire (lifelong friend). Asked to bring [place] a copy when complete.

## 2022-12-25 NOTE — Assessment & Plan Note (Signed)
See below - start GLP1RA ozempic

## 2022-12-25 NOTE — Assessment & Plan Note (Signed)
Followed by pain clinic.  

## 2022-12-25 NOTE — Patient Instructions (Addendum)
Call GI to schedule colonoscopy in [place]. Let us know if you need referral.  Send [place] dates of shingrix shots to update your chart.  Stop extra iron. Trial ozempic 0.25mg  weekly for 1 month then increase to 0.5mg  weekly. You may schedule nurse visit for first shot if you'd like. Let me know if any trouble tolerating new medicine.  We will refer you to [facility]NT for [facility] device evaluation.  Return in 4 months for diabetes follow up visit.

## 2022-12-25 NOTE — Assessment & Plan Note (Signed)
Chronic, stable on omeprazole 20mg daily - continue.  

## 2022-12-25 NOTE — Assessment & Plan Note (Signed)
Sees urology. PSA stable.

## 2022-12-28 ENCOUNTER — Encounter: Payer: Self-pay | Admitting: *Deleted

## 2022-12-29 ENCOUNTER — Telehealth: Payer: Self-pay | Admitting: Family Medicine

## 2022-12-29 DIAGNOSIS — G4733 Obstructive sleep apnea (adult) (pediatric): Secondary | ICD-10-CM

## 2022-12-29 NOTE — Telephone Encounter (Signed)
[facility] called to let Dr Reece Agar that they do not do expired device OSA for sleep apnea at their office.Patient would have to be referred to another place to have this done

## 2023-01-02 NOTE — Telephone Encounter (Signed)
Patient notified as instructed by telephone and verbalized understanding. Patient was advised that one of the referral coordinators will be in touch with him.

## 2023-01-02 NOTE — Telephone Encounter (Signed)
Left a message on voicemail for patient to call the office back. 

## 2023-01-02 NOTE — Telephone Encounter (Signed)
Plz notify patient [name] doesn't do Inspire device evaluation.  We will refer him to different [name] practice. I will forward to referral coordinators to be aware and to send referral to new office.

## 2023-01-04 ENCOUNTER — Telehealth: Payer: Self-pay

## 2023-01-04 ENCOUNTER — Telehealth: Payer: Self-pay | Admitting: Family Medicine

## 2023-01-04 NOTE — Telephone Encounter (Signed)
Noted.   Spoke with Delice Bison of [facility] notifying them of message from [employer]. States claim is going through and expresses her thanks for the call.

## 2023-01-04 NOTE — Telephone Encounter (Signed)
PA approved.   Approved. . Authorization Expiration Date: Jan 04, 2024.

## 2023-01-04 NOTE — Telephone Encounter (Signed)
PA initiated via Covermymeds; KEY: [id number]. Awaiting determination.

## 2023-01-04 NOTE — Telephone Encounter (Signed)
[employer] representative contacted the office regarding prior authorization for ozempic. Stated ozempic was approved for coverage by patient's insurance, states it has been approved for a full year starting 01/04/2023 and ending 01/04/2024. Caller states a detailed fax will be coming through with this information.

## 2023-01-10 IMAGING — CR DG ABDOMEN 1V
1 series · 2 of 2 positions shown · non-contrast
Comparison: 10/20/2019

CLINICAL DATA: Kidney stone.

EXAM:
ABDOMEN - 1 VIEW

[Series 1: dg abd 1 view · 0.14mm/px · 2 of 2 slices shown]
[im 1/2]
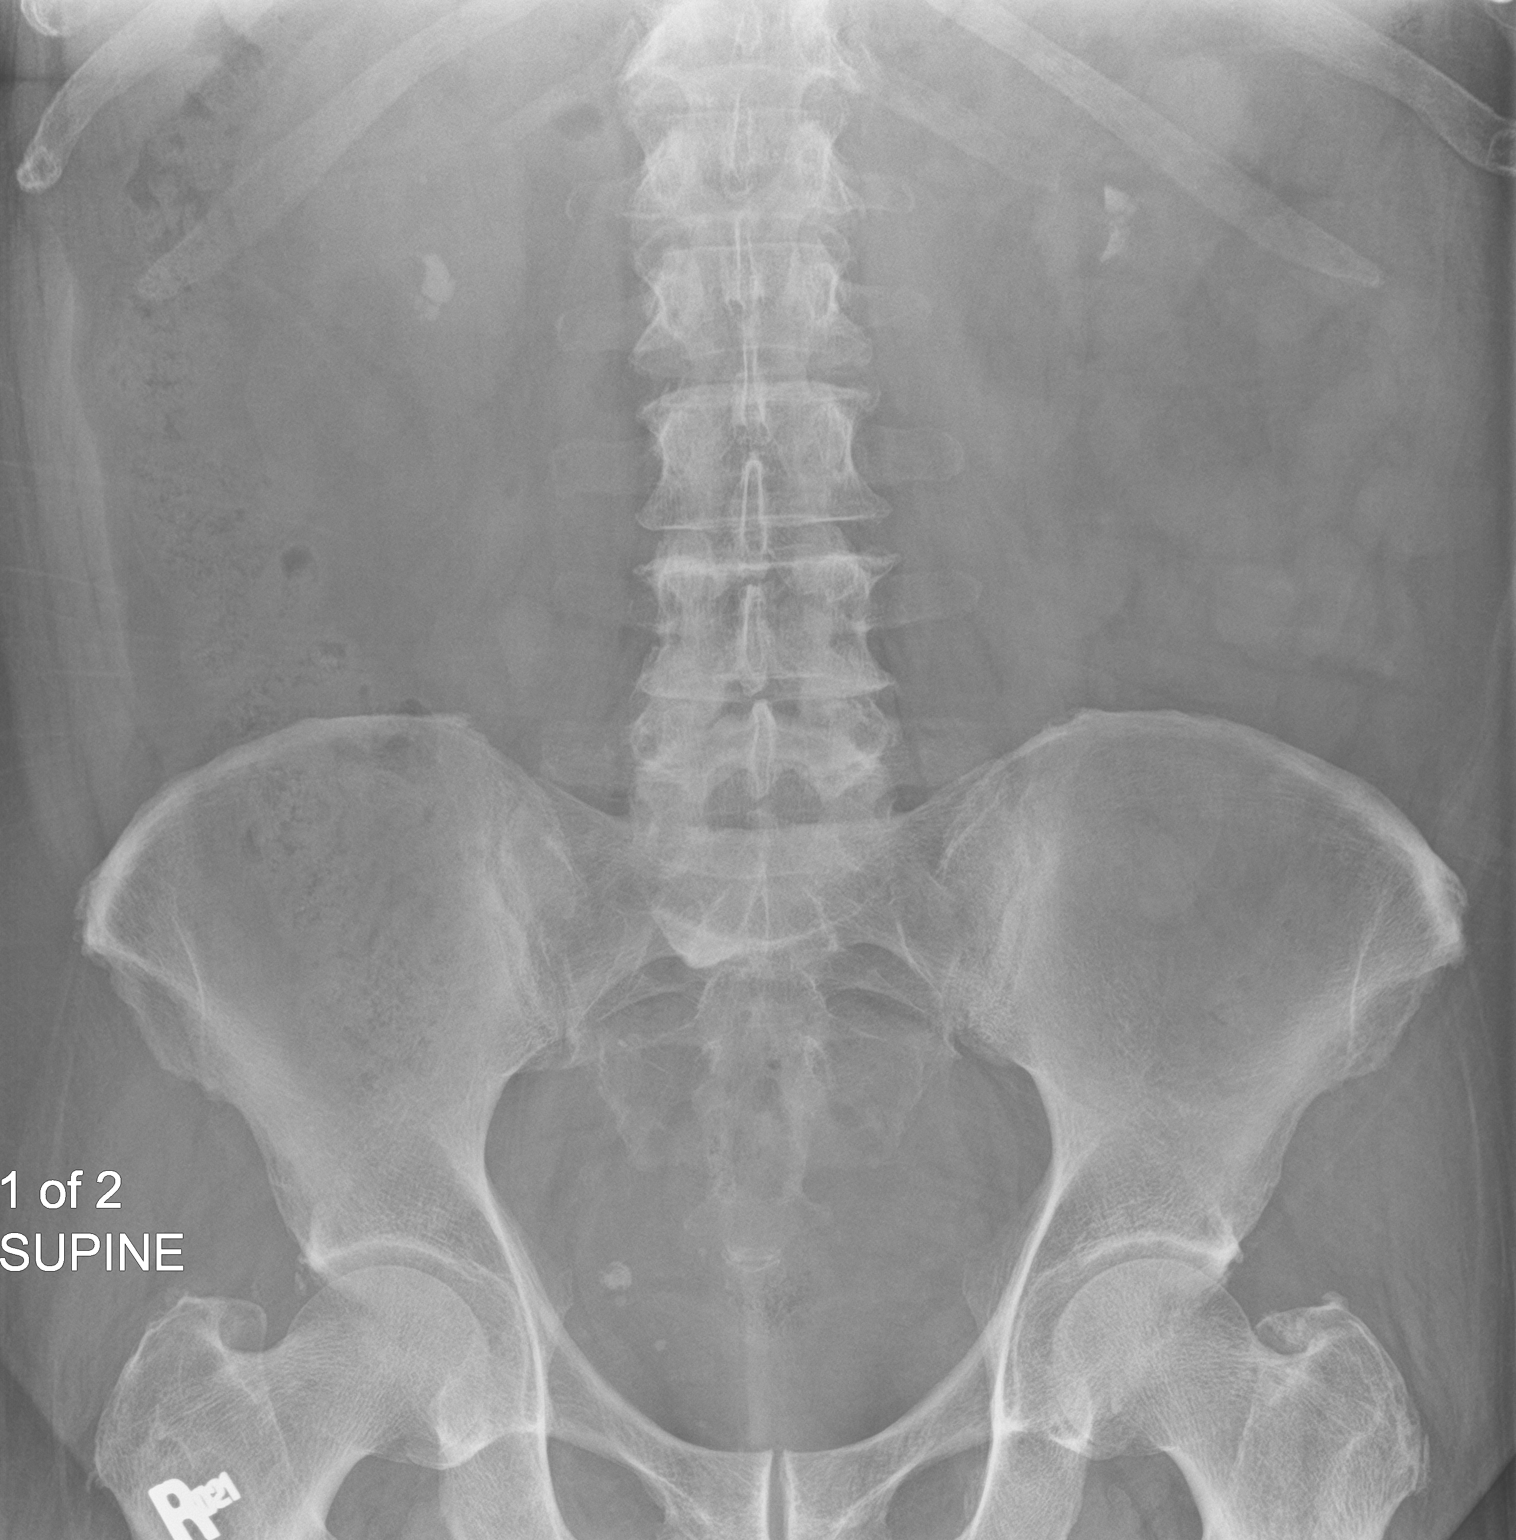
[im 2/2]
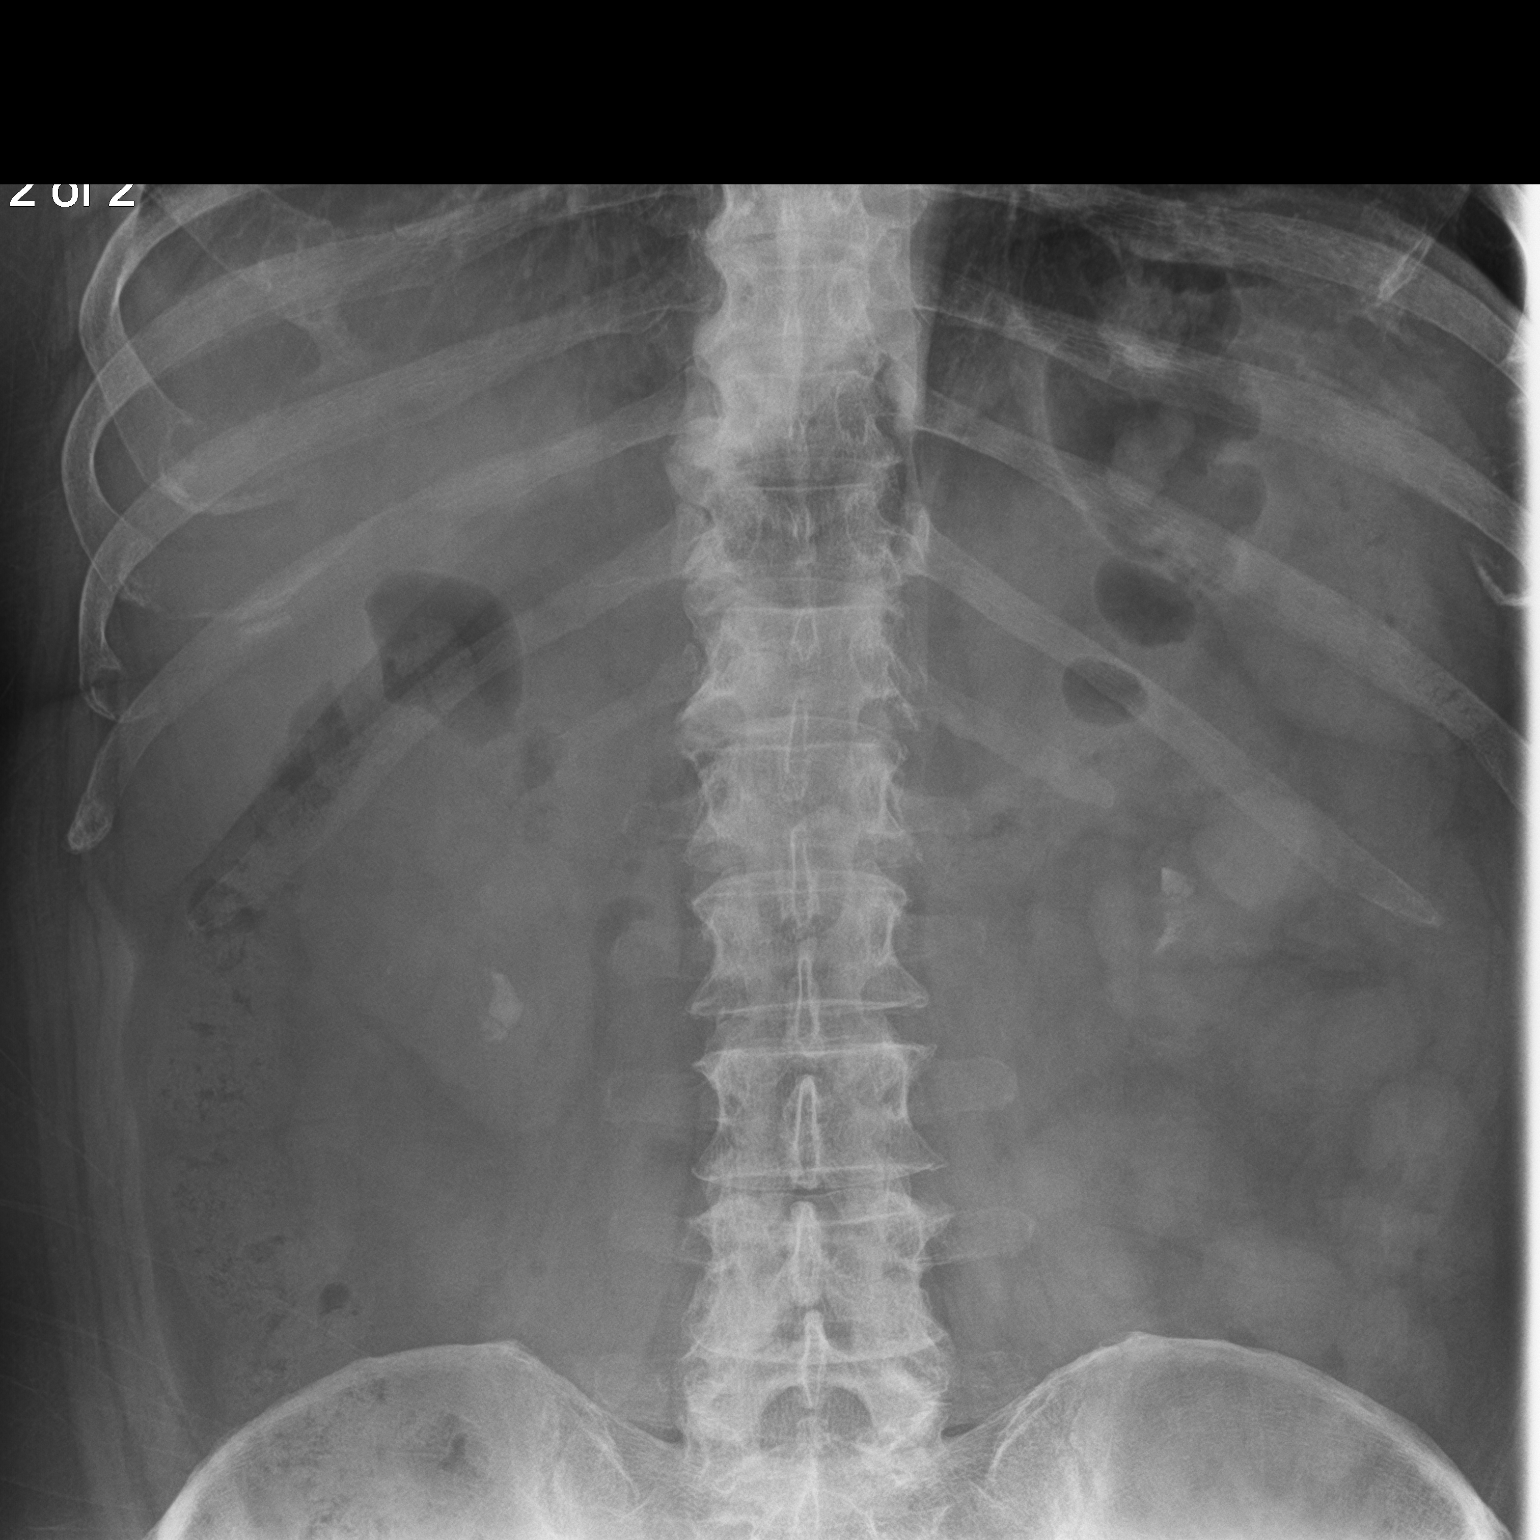

[2 of 2 positions shown; findings below may reference images not displayed]

FINDINGS: Cluster of calculi over the lower right kidney with the largest
stone measuring 12 mm. There are additional more peripheral punctate
calculi which are marked on the image.

Cluster of calculi over the lower left kidney which in total
measures 2.1 cm.

The renal calculi appear more confluent than before, especially at
the left lower pole.

No evident ureteral calculi.  Pelvic phleboliths are stable.
IMPRESSION: Bilateral nephrolithiasis with probable progression since [DATE]

## 2023-01-16 NOTE — Progress Notes (Signed)
Remote pacemaker transmission.   

## 2023-01-19 NOTE — Telephone Encounter (Signed)
Plz notify - received note from ENT - he is not candidate for Inspire implantable device as BMI has to be lower than 32 and it's currently 34.68.

## 2023-01-22 NOTE — Telephone Encounter (Signed)
Spoke with pt relaying Dr. G's message. Pt verbalizes understanding.  

## 2023-01-25 NOTE — Progress Notes (Signed)
PROVIDER NOTE: Information contained herein reflects review and annotations entered in association with encounter. Interpretation of such information and data should be left to medically-trained personnel. Information provided to patient can be located elsewhere in the medical record under "Patient Instructions". Document created using STT-dictation technology, any transcriptional errors that may result from process are unintentional.    Patient: Mark [name]  Service Category: E/M  Provider: Oswaldo Done, MD  DOB: 06/16/1956  DOS: 01/29/2023  Referring Provider: [name], MD  MRN: [id number]  Specialty: Interventional Pain Management  PCP: [name], MD  Type: Established Patient  Setting: Ambulatory outpatient    Location: Office  Delivery: Face-to-face     HPI  [name], a 67 y.o. year old male, is here today because of his Chronic pain syndrome [G89.4]. [name] primary complain today is No chief complaint on file.  Pertinent problems: 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); Cervicogenic headache; Cervical spondylosis with radiculopathy (Bilateral) (L>R); Cervical facet syndrome (Bilateral) (L>R); Cervical foraminal stenosis (Severe C6-7) (Left); Chronic sacroiliac joint pain (Bilateral); Osteoarthritis of sacroiliac joint (Bilateral); Lumbar facet hypertrophy (L1-2, L2-3, and L4-5) (Bilateral); Lumbar IVDD (intervertebral disc displacement); 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); Lumbar facet syndrome (Bilateral); Osteoarthritis of shoulder (Left); Osteoarthritis of knee (Bilateral) (R>L); Chronic cervical radicular pain; Chronic fatigue; RLS (restless legs syndrome); Trigger finger of index finger (Right); Cervicalgia; Trigger finger of thumb  (Right); Chronic thumb pain (Left); Trigger middle finger of right hand; Chronic hand pain (Left); Osteoarthritis of first carpometacarpal joint (thumb) of hand (Left); Trigger point with back pain (Left); Trigger point of shoulder region (scapula) (Left); History of laparoscopic cholecystectomy (10/27/2020); DDD (degenerative disc disease), cervical; Chronic low back pain (Bilateral) w/o sciatica; Chronic shoulder pain (Bilateral) (L>R); and Chronic shoulder pain (Left) on their pertinent problem list. Pain Assessment: Severity of   is reported as a  /10. Location:    / . Onset:  . Quality:  . Timing:  . Modifying factor(s):  Marland Kitchen Vitals:  vitals were not taken for this visit.  BMI: Estimated body mass index is 34.68 kg/m as calculated from the following:   Height as of 12/25/22: 5' 6.5" (1.689 m).   Weight as of 12/25/22: 218 lb 2 oz (98.9 kg). Last encounter: 11/01/2022. Last procedure: Visit date not found.  Reason for encounter: medication management. ***  Routine UDS ordered today.   RTCB: 05/03/2023   Pharmacotherapy Assessment  Analgesic: Oxycodone IR 10 mg, 1 tab PO TID (30 mg/day of oxycodone) MME/day: 45 mg/day.   Monitoring: [place] PMP: PDMP reviewed during this encounter.       Pharmacotherapy: No side-effects or adverse reactions reported. Compliance: No problems identified. Effectiveness: Clinically acceptable.  No notes on file  No results found for: "CBDTHCR" No results found for: "D8THCCBX" No results found for: "D9THCCBX"  UDS:  Summary  Date Value Ref Range Status  12/12/2021 Note  Final    Comment:    ==================================================================== [employer] Select 13 (MW) ==================================================================== Test                             Result       Flag       Units  Drug Present and Declared for Prescription Verification   Oxycodone  4111         EXPECTED   ng/mg creat   Noroxycodone                    6306         EXPECTED   ng/mg creat    Sources of oxycodone include scheduled prescription medications.    Noroxycodone is an expected metabolite of oxycodone.  ==================================================================== Test                      Result    Flag   Units      Ref Range   Creatinine              54               mg/dL      >=29 ==================================================================== Declared Medications:  The flagging and interpretation on this report are based on the  following declared medications.  Unexpected results may arise from  inaccuracies in the declared medications.   **Note: The testing scope of this panel includes these medications:   Oxycodone   **Note: The testing scope of this panel does not include the  following reported medications:   Amlodipine (Norvasc)  Aspirin  Atorvastatin (Lipitor)  Cetirizine (Zyrtec)  Clindamycin (Cleocin)  Cranberry  Cyclobenzaprine (Flexeril)  Fluoxetine (Prozac)  Fluticasone (Flonase)  Furosemide (Lasix)  Iron  Multivitamin  Naloxone (Narcan)  Naproxen (Naprosyn)  Omeprazole (Prilosec)  Potassium  Ropinirole (Requip)  Supplement  Vitamin C  Vitamin D ==================================================================== For clinical consultation, please call [phone]. ====================================================================       ROS  Constitutional: Denies any fever or chills Gastrointestinal: No reported hemesis, hematochezia, vomiting, or acute GI distress Musculoskeletal: Denies any acute onset joint swelling, redness, loss of ROM, or weakness Neurological: No reported episodes of acute onset apraxia, aphasia, dysarthria, agnosia, amnesia, paralysis, loss of coordination, or loss of consciousness  Medication Review  B Complex-C, CertaVite Senior/Antioxidant, Cholecalciferol, Cyanocobalamin, FLUoxetine, Fish Oil, Garlic, Oxycodone HCl,  Semaglutide(0.25 or 0.5MG /DOS), amLODipine, aspirin, atorvastatin, cyclobenzaprine, diphenhydrAMINE, fluticasone, furosemide, metFORMIN, naloxone, naproxen, omeprazole, potassium citrate, rOPINIRole, and vitamin C  History Review  Allergy: [name] is allergic to ciprofloxacin, losartan, metformin, buprenorphine hcl, morphine and codeine, and penicillins. Drug: [name]  reports no history of drug use. Alcohol:  reports no history of alcohol use. Tobacco:  reports that he has never smoked. He has never used smokeless tobacco. Social: Mr. Stockard  reports that he has never smoked. He has never used smokeless tobacco. He reports that he does not drink alcohol and does not use drugs. Medical:  has a past medical history of Anxiety, Arthritis, Chronic pain, Depression, Heart murmur, History of kidney stones (11/17/2015), Hydrocele in adult (05/07/2015), Hypertension, Kidney stones, Migraine, MRSA carrier (2019), Presence of permanent cardiac pacemaker, Prostatitis, and Sleep apnea. Surgical: [name]  has a past surgical history that includes Tonsillectomy (1964); Carpal tunnel release (Bilateral, 2006); Ulnar nerve repair (2008); Cervical spine surgery (2008); Rotator cuff repair (Left, 2009); pacemaker placement (2009); Lithotripsy (Bilateral, 2012); Lithotripsy (Left, 11/2015); Cardiovascular stress test (2014); Colonoscopy (07/2007); implantable cardioverter defibrillator (icd) generator change (Left, 12/13/2017); pace maker revision (12/13/2017); Laparoscopic cholecystectomy (10/2020); Cholecystectomy (N/A, 10/27/2020); ERCP (N/A, 10/26/2020); Esophagogastroduodenoscopy (egd) with propofol (N/A, 10/26/2020); EUS (10/26/2020); Sphincterotomy (10/26/2020); and removal of stones (10/26/2020). Family: family history includes Arthritis in his father and mother; Cancer in his father and mother; Diabetes in his mother; Heart block in his father; Heart disease in his  father and mother.  Laboratory Chemistry Profile    Renal Lab Results  Component Value Date   BUN 24 (H) 12/18/2022   CREATININE 1.02 12/18/2022   GFR 76.89 12/18/2022   GFRAA >60 12/06/2017   GFRNONAA >60 10/29/2020    Hepatic Lab Results  Component Value Date   AST 31 12/18/2022   ALT 40 12/18/2022   ALBUMIN 3.9 12/18/2022   ALKPHOS 78 12/18/2022   HCVAB NEGATIVE 07/22/2013   LIPASE 39 10/28/2020    Electrolytes Lab Results  Component Value Date   NA 138 12/18/2022   K 3.9 12/18/2022   CL 100 12/18/2022   CALCIUM 9.1 12/18/2022   MG 2.1 09/01/2015    Bone Lab Results  Component Value Date   25OHVITD1 63 02/02/2016   25OHVITD2 <1.0 02/02/2016   25OHVITD3 63 02/02/2016   TESTOSTERONE 495 08/08/2017    Inflammation (CRP: Acute Phase) (ESR: Chronic Phase) Lab Results  Component Value Date   CRP 1.7 (H) 09/01/2015   ESRSEDRATE 6 09/01/2015         Note: Above Lab results reviewed.  Recent Imaging Review  CUP PACEART REMOTE DEVICE CHECK Scheduled remote reviewed. Normal device function.    Next remote 91 days. [place], CVRS Note: Reviewed        Physical Exam  General appearance: Well nourished, well developed, and well hydrated. In no apparent acute distress Mental status: Alert, oriented x 3 (person, place, & time)       Respiratory: No evidence of acute respiratory distress Eyes: PERLA Vitals: There were no vitals taken for this visit. BMI: Estimated body mass index is 34.68 kg/m as calculated from the following:   Height as of 12/25/22: 5' 6.5" (1.689 m).   Weight as of 12/25/22: 218 lb 2 oz (98.9 kg). Ideal: Patient weight not recorded  Assessment   Diagnosis Status  1. Chronic pain syndrome   2. Chronic neck pain (1ry area of Pain) (Bilateral) (L>R)   3. Cervicalgia   4. Chronic sacroiliac joint pain (Bilateral)   5. Cervicogenic headache   6. 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 Lumbar facet syndrome (Bilateral)   8. Chronic shoulder pain (Bilateral) (L>R)   9.  Chronic cervical radicular pain   10. Chronic low back pain (Bilateral) w/o sciatica   11. Pharmacologic therapy   12. Chronic use of opiate for therapeutic purpose   13. Encounter for medication management   14. Encounter for chronic pain management    Controlled Controlled Controlled   Updated Problems: No problems updated.  Plan of Care  Problem-specific:  No problem-specific Assessment & Plan notes found for this encounter.  Mr. DAVAUN Mark [name] has a current medication list which includes the following long-term medication(s): amlodipine, atorvastatin, diphenhydramine, fluoxetine, fluoxetine, fluticasone, furosemide, metformin, omeprazole, oxycodone hcl, and ropinirole.  Pharmacotherapy (Medications Ordered): No orders of the defined types were placed in this encounter.  Orders:  No orders of the defined types were placed in this encounter.  Follow-up plan:   No follow-ups on file.      Interventional Therapies  Risk  Complexity Considerations:   WNL   Planned  Pending:   Diagnostic left IA shoulder joint injection #1 vs left suprascapular nerve block #1    Under consideration:   Diagnostic right hand digit 2 trigger finger injection Therapeutic bilateral IA Hyalgan knee injections  Diagnostic bilateral cervical facet block  Possible bilateral cervical facet RFA  Diagnostic left sided CESI  Diagnostic bilateral  IA knee injection  Diagnostic bilateral genicular NB  Possible bilateral genicular nerve RFA  Diagnostic bilateral sacroiliac joint block  Possible bilateral sacroiliac joint RFA  Diagnostic bilateral IA shoulder joint injection  Diagnostic bilateral suprascapular NB  Possible bilateral suprascapular nerve RFA  Diagnostic bilateral lumbar facet block  Possible bilateral lumbar facet RFA  Diagnostic left L4-5 LESI    Completed:   Therapeutic left levator scapula, supraspinatus, and rhomboid muscle TPI/MNB x1 (03/30/2020) (100/100/85/75)  Diagnostic  left C7-T1 cervical ESI x1 (06/21/2016) (90/90/70/75)  Diagnostic bilateral IA Hyalgan knee inj. x3 (06/21/2016) (left: 100/90/100/90/75) (right: 90/90/80/75)  Diagnostic/therapeutic right thumb inj. x1 (05/23/2016) (n/a) Diagnostic/therapeutic left thumb (CMC-Trapezium) inj. x1 (03/11/2020) (100/100/75/75)    Therapeutic  Palliative (PRN) options:   Therapeutic IA Hyalgan knee injections       Recent Visits Date Type Provider Dept  11/01/22 Office Visit [name], MD [facility]  Showing recent visits within past 90 days and meeting all other requirements Future Appointments Date Type Provider Dept  01/29/23 Appointment [name], MD [facility]  Showing future appointments within next 90 days and meeting all other requirements  I discussed the assessment and treatment plan with the patient. The patient was provided an opportunity to ask questions and all were answered. The patient agreed with the plan and demonstrated an understanding of the instructions.  Patient advised to call back or seek an in-person evaluation if the symptoms or condition worsens.  Duration of encounter: *** minutes.  Total time on encounter, as per AMA guidelines included both the face-to-face and non-face-to-face time personally spent by the physician and/or other qualified health care professional(s) on the day of the encounter (includes time in activities that require the physician or other qualified health care professional and does not include time in activities normally performed by clinical staff). Physician's time may include the following activities when performed: Preparing to see the patient (e.g., pre-charting review of records, searching for previously ordered imaging, lab work, and nerve conduction tests) Review of prior analgesic pharmacotherapies. Reviewing PMP Interpreting ordered tests (e.g., lab work, imaging, nerve conduction tests) Performing  post-procedure evaluations, including interpretation of diagnostic procedures Obtaining and/or reviewing separately obtained history Performing a medically appropriate examination and/or evaluation Counseling and educating the patient/family/caregiver Ordering medications, tests, or procedures Referring and communicating with other health care professionals (when not separately reported) Documenting clinical information in the electronic or other health record Independently interpreting results (not separately reported) and communicating results to the patient/ family/caregiver Care coordination (not separately reported)  Note by: Oswaldo Done, MD Date: 01/29/2023; Time: 12:56 PM

## 2023-01-28 NOTE — Patient Instructions (Signed)
____________________________________________________________________________________________  Opioid Pain Medication Update  To: All patients taking opioid pain medications. (I.e.: hydrocodone, hydromorphone, oxycodone, oxymorphone, morphine, codeine, methadone, tapentadol, tramadol, buprenorphine, fentanyl, etc.)  Re: Updated review of side effects and adverse reactions of opioid analgesics, as well as new information about long term effects of this class of medications.  Direct risks of long-term opioid therapy are not limited to opioid addiction and overdose. Potential medical risks include serious fractures, breathing problems during sleep, hyperalgesia, immunosuppression, chronic constipation, bowel obstruction, myocardial infarction, and tooth decay secondary to xerostomia.  Unpredictable adverse effects that can occur even if you take your medication correctly: Cognitive impairment, respiratory depression, and death. Most people think that if they take their medication "correctly", and "as instructed", that they will be safe. Nothing could be farther from the truth. In reality, a significant amount of recorded deaths associated with the use of opioids has occurred in individuals that had taken the medication for a long time, and were taking their medication correctly. The following are examples of how this can happen: Patient taking his/her medication for a long time, as instructed, without any side effects, is given a certain antibiotic or another unrelated medication, which in turn triggers a "Drug-to-drug interaction" leading to disorientation, cognitive impairment, impaired reflexes, respiratory depression or an untoward event leading to serious bodily harm or injury, including death.  Patient taking his/her medication for a long time, as instructed, without any side effects, develops an acute impairment of liver and/or kidney function. This will lead to a rapid inability of the body to  breakdown and eliminate their pain medication, which will result in effects similar to an "overdose", but with the same medicine and dose that they had always taken. This again may lead to disorientation, cognitive impairment, impaired reflexes, respiratory depression or an untoward event leading to serious bodily harm or injury, including death.  A similar problem will occur with patients as they grow older and their liver and kidney function begins to decrease as part of the aging process.  Background information: Historically, the original case for using long-term opioid therapy to treat chronic noncancer pain was based on safety assumptions that subsequent experience has called into question. In 1996, the American Pain Society and the American Academy of Pain Medicine issued a consensus statement supporting long-term opioid therapy. This statement acknowledged the dangers of opioid prescribing but concluded that the risk for addiction was low; respiratory depression induced by opioids was short-lived, occurred mainly in opioid-naive patients, and was antagonized by pain; tolerance was not a common problem; and efforts to control diversion should not constrain opioid prescribing. This has now proven to be wrong. Experience regarding the risks for opioid addiction, misuse, and overdose in community practice has failed to support these assumptions.  According to the Centers for Disease Control and Prevention, fatal overdoses involving opioid analgesics have increased sharply over the past decade. Currently, more than 96,700 people die from drug overdoses every year. Opioids are a factor in 7 out of every 10 overdose deaths. Deaths from drug overdose have surpassed motor vehicle accidents as the leading cause of death for individuals between the ages of 35 and 54.  Clinical data suggest that neuroendocrine dysfunction may be very common in both men and women, potentially causing hypogonadism, erectile  dysfunction, infertility, decreased libido, osteoporosis, and depression. Recent studies linked higher opioid dose to increased opioid-related mortality. Controlled observational studies reported that long-term opioid therapy may be associated with increased risk for cardiovascular events. Subsequent meta-analysis concluded   that the safety of long-term opioid therapy in elderly patients has not been proven.   Side Effects and adverse reactions: Common side effects: Drowsiness (sedation). Dizziness. Nausea and vomiting. Constipation. Physical dependence -- Dependence often manifests with withdrawal symptoms when opioids are discontinued or decreased. Tolerance -- As you take repeated doses of opioids, you require increased medication to experience the same effect of pain relief. Respiratory depression -- This can occur in healthy people, especially with higher doses. However, people with COPD, asthma or other lung conditions may be even more susceptible to fatal respiratory impairment.  Uncommon side effects: An increased sensitivity to feeling pain and extreme response to pain (hyperalgesia). Chronic use of opioids can lead to this. Delayed gastric emptying (the process by which the contents of your stomach are moved into your small intestine). Muscle rigidity. Immune system and hormonal dysfunction. Quick, involuntary muscle jerks (myoclonus). Arrhythmia. Itchy skin (pruritus). Dry mouth (xerostomia).  Long-term side effects: Chronic constipation. Sleep-disordered breathing (SDB). Increased risk of bone fractures. Hypothalamic-pituitary-adrenal dysregulation. Increased risk of overdose.  RISKS: Fractures and Falls:  Opioids increase the risk and incidence of falls. This is of particular importance in elderly patients.  Endocrine System:  Long-term administration is associated with endocrine abnormalities (endocrinopathies). (Also known as Opioid-induced Endocrinopathy) Influences  on both the hypothalamic-pituitary-adrenal axis?and the hypothalamic-pituitary-gonadal axis have been demonstrated with consequent hypogonadism and adrenal insufficiency in both sexes. Hypogonadism and decreased levels of dehydroepiandrosterone sulfate have been reported in men and women. Endocrine effects include: Amenorrhoea in women (abnormal absence of menstruation) Reduced libido in both sexes Decreased sexual function Erectile dysfunction in men Hypogonadisms (decreased testicular function with shrinkage of testicles) Infertility Depression and fatigue Loss of muscle mass Anxiety Depression Immune suppression Hyperalgesia Weight gain Anemia Osteoporosis Patients (particularly women of childbearing age) should avoid opioids. There is insufficient evidence to recommend routine monitoring of asymptomatic patients taking opioids in the long-term for hormonal deficiencies.  Immune System: Human studies have demonstrated that opioids have an immunomodulating effect. These effects are mediated via opioid receptors both on immune effector cells and in the central nervous system. Opioids have been demonstrated to have adverse effects on antimicrobial response and anti-tumour surveillance. Buprenorphine has been demonstrated to have no impact on immune function.  Opioid Induced Hyperalgesia: Human studies have demonstrated that prolonged use of opioids can lead to a state of abnormal pain sensitivity, sometimes called opioid induced hyperalgesia (OIH). Opioid induced hyperalgesia is not usually seen in the absence of tolerance to opioid analgesia. Clinically, hyperalgesia may be diagnosed if the patient on long-term opioid therapy presents with increased pain. This might be qualitatively and anatomically distinct from pain related to disease progression or to breakthrough pain resulting from development of opioid tolerance. Pain associated with hyperalgesia tends to be more diffuse than the  pre-existing pain and less defined in quality. Management of opioid induced hyperalgesia requires opioid dose reduction.  Cancer: Chronic opioid therapy has been associated with an increased risk of cancer among noncancer patients with chronic pain. This association was more evident in chronic strong opioid users. Chronic opioid consumption causes significant pathological changes in the small intestine and colon. Epidemiological studies have found that there is a link between opium dependence and initiation of gastrointestinal cancers. Cancer is the second leading cause of death after cardiovascular disease. Chronic use of opioids can cause multiple conditions such as GERD, immunosuppression and renal damage as well as carcinogenic effects, which are associated with the incidence of cancers.   Mortality: Long-term opioid use   has been associated with increased mortality among patients with chronic non-cancer pain (CNCP).  Prescription of long-acting opioids for chronic noncancer pain was associated with a significantly increased risk of all-cause mortality, including deaths from causes other than overdose.  Reference: Von Korff M, Kolodny A, Deyo RA, Chou R. Long-term opioid therapy reconsidered. Ann Intern Med. 2011 Sep 6;155(5):325-8. doi: 10.7326/[id number]. PMID: 21893626; PMCID: PMC3280085. Bedson J, Chen Y, Ashworth J, Hayward RA, Dunn KM, Jordan KP. Risk of adverse events in patients prescribed long-term opioids: A cohort study in the [place] Clinical Practice Research Datalink. Eur J Pain. 2019 May;23(5):908-922. doi: 10.1002/ejp.1357. Epub 2019 Jan 31. PMID: 30620116. Colameco S, Coren JS, Ciervo CA. Continuous opioid treatment for chronic noncancer pain: a time for moderation in prescribing. Postgrad Med. 2009 Jul;121(4):61-6. doi: 10.3810/pgm.2009.07.2032. PMID: 19641271. Chou R, Turner JA, Devine EB, Hansen RN, Sullivan SD, Blazina I, Dana T, Bougatsos C, Deyo RA. The  effectiveness and risks of long-term opioid therapy for chronic pain: a systematic review for a National Institutes of Health Pathways to Prevention Workshop. Ann Intern Med. 2015 Feb 17;162(4):276-86. doi: 10.7326/M14-2559. PMID: 25581257. Warner M, Chen LH, Makuc DM. NCHS Data Brief No. 22. [place]: Centers for Disease Control and Prevention; 2009. Sep, Increase in Fatal Poisonings Involving Opioid Analgesics in the [place], 1999-2006. Song IA, Choi HR, Oh TK. Long-term opioid use and mortality in patients with chronic non-cancer pain: Ten-year follow-up study in [place] from 2010 through 2019. EClinicalMedicine. 2022 Jul 18;51:101558. doi: 10.1016/j.eclinm.2022.101558. PMID: 35875817; PMCID: PMC9304910. Huser, W., Schubert, T., Vogelmann, T. et al. All-cause mortality in patients with long-term opioid therapy compared with non-opioid analgesics for chronic non-cancer pain: a database study. BMC Med 18, 162 (2020). https://doi.org/10.1186/[email or web address] Rashidian H, Zendehdel K, Kamangar F, Malekzadeh R, Haghdoost AA. An Ecological Study of the Association between Opiate Use and Incidence of Cancers. Addict Health. 2016 Fall;8(4):252-260. PMID: 28819556; PMCID: PMC5554805.  Our Goal: Our goal is to control your pain with means other than the use of opioid pain medications.  Our Recommendation: Talk to your physician about coming off of these medications. We can assist you with the tapering down and stopping these medicines. Based on the new information, even if you cannot completely stop the medication, a decrease in the dose may be associated with a lesser risk. Ask for other means of controlling the pain. Decrease or eliminate those factors that significantly contribute to your pain such as smoking, obesity, and a diet heavily tilted towards "inflammatory" nutrients.  Last Updated: 10/04/2022    ____________________________________________________________________________________________     ____________________________________________________________________________________________  Transfer of Pain Medication between Pharmacies  Re: 2023 DEA Clarification on existing regulation  Published on DEA Website: April 07, 2022  Title: Revised Regulation Allows DEA-Registered Pharmacies to Transfer Electronic Prescriptions at a Patient's Request DEA Headquarters Division - Public Information Office  "Patients now have the ability to request their electronic prescription be transferred to another pharmacy without having to go back to their practitioner to initiate the request. This revised regulation went into effect on Monday, April 03, 2022.     At a patient's request, a DEA-registered retail pharmacy can now transfer an electronic prescription for a controlled substance (schedules II-V) to another DEA-registered retail pharmacy. Prior to this change, patients would have to go through their practitioner to cancel their prescription and have it re-issued to a different pharmacy. The process was taxing and time consuming for both patients and practitioners.    The Drug Enforcement Administration (DEA) published its intent to revise the   process for transferring electronic prescriptions on June 25, 2020.  The final rule was published in the federal register on March 02, 2022 and went into effect 30 days later.  Under the final rule, a prescription can only be transferred once between pharmacies, and only if allowed under existing state or other applicable law. The prescription must remain in its electronic form; may not be altered in any way; and the transfer must be communicated directly between two licensed pharmacists. It's important to note, any authorized refills transfer with the original prescription, which means the entire prescription will be filled at the same pharmacy."     REFERENCES: 1. DEA website announcement https://www.dea.gov/stories/2023/2023-04/2022-09-01/revised-regulation-allows-dea-registered-pharmacies-transfer  2. Department of Justice website  https://www.govinfo.gov/content/pkg/FR-2022-03-02/pdf/2023-15847.pdf  3. DEPARTMENT OF JUSTICE Drug Enforcement Administration 21 CFR Part 1306 [Docket No. DEA-637] RIN [id number] "Transfer of Electronic Prescriptions for Schedules II-V Controlled Substances Between Pharmacies for Initial Filling"  ____________________________________________________________________________________________     _______________________________________________________________________  Medication Rules  Purpose: To inform patients, and their family members, of our medication rules and regulations.  Applies to: All patients receiving prescriptions from our practice (written or electronic).  Pharmacy of record: This is the pharmacy where your electronic prescriptions will be sent. Make sure we have the correct one.  Electronic prescriptions: In compliance with the [place] Strengthen Opioid Misuse Prevention (STOP) Act of 2017 (Session Law 2017-74/H243), effective August 07, 2018, all controlled substances must be electronically prescribed. Written prescriptions, faxing, or calling prescriptions to a pharmacy will no longer be done.  Prescription refills: These will be provided only during in-person appointments. No medications will be renewed without a "face-to-face" evaluation with your provider. Applies to all prescriptions.  NOTE: The following applies primarily to controlled substances (Opioid* Pain Medications).   Type of encounter (visit): For patients receiving controlled substances, face-to-face visits are required. (Not an option and not up to the patient.)  Patient's responsibilities: Pain Pills: Bring all pain pills to every appointment (except for procedure appointments). Pill Bottles: Bring  pills in original pharmacy bottle. Bring bottle, even if empty. Always bring the bottle of the most recent fill.  Medication refills: You are responsible for knowing and keeping track of what medications you are taking and when is it that you will need a refill. The day before your appointment: write a list of all prescriptions that need to be refilled. The day of the appointment: give the list to the admitting nurse. Prescriptions will be written only during appointments. No prescriptions will be written on procedure days. If you forget a medication: it will not be "Called in", "Faxed", or "electronically sent". You will need to get another appointment to get these prescribed. No early refills. Do not call asking to have your prescription filled early. Partial  or short prescriptions: Occasionally your pharmacy may not have enough pills to fill your prescription.  NEVER ACCEPT a partial fill or a prescription that is short of the total amount of pills that you were prescribed.  With controlled substances the law allows 72 hours for the pharmacy to complete the prescription.  If the prescription is not completed within 72 hours, the pharmacist will require a new prescription to be written. This means that you will be short on your medicine and we WILL NOT send another prescription to complete your original prescription.  Instead, request the pharmacy to send a carrier to a nearby branch to get enough medication to provide you with your full prescription. Prescription Accuracy: You are responsible for carefully inspecting your   prescriptions before leaving our office. Have the discharge nurse carefully go over each prescription with you, before taking them home. Make sure that your name is accurately spelled, that your address is correct. Check the name and dose of your medication to make sure it is accurate. Check the number of pills, and the written instructions to make sure they are clear and accurate. Make  sure that you are given enough medication to last until your next medication refill appointment. Taking Medication: Take medication as prescribed. When it comes to controlled substances, taking less pills or less frequently than prescribed is permitted and encouraged. Never take more pills than instructed. Never take the medication more frequently than prescribed.  Inform other Doctors: Always inform, all of your healthcare providers, of all the medications you take. Pain Medication from other Providers: You are not allowed to accept any additional pain medication from any other Doctor or Healthcare provider. There are two exceptions to this rule. (see below) In the event that you require additional pain medication, you are responsible for notifying us, as stated below. Cough Medicine: Often these contain an opioid, such as codeine or hydrocodone. Never accept or take cough medicine containing these opioids if you are already taking an opioid* medication. The combination may cause respiratory failure and death. Medication Agreement: You are responsible for carefully reading and following our Medication Agreement. This must be signed before receiving any prescriptions from our practice. Safely store a copy of your signed Agreement. Violations to the Agreement will result in no further prescriptions. (Additional copies of our Medication Agreement are available upon request.) Laws, Rules, & Regulations: All patients are expected to follow all Federal and State Laws, Statutes, Rules, & Regulations. Ignorance of the Laws does not constitute a valid excuse.  Illegal drugs and Controlled Substances: The use of illegal substances (including, but not limited to marijuana and its derivatives) and/or the illegal use of any controlled substances is strictly prohibited. Violation of this rule may result in the immediate and permanent discontinuation of any and all prescriptions being written by our practice. The use of  any illegal substances is prohibited. Adopted CDC guidelines & recommendations: Target dosing levels will be at or below 60 MME/day. Use of benzodiazepines** is not recommended.  Exceptions: There are only two exceptions to the rule of not receiving pain medications from other Healthcare Providers. Exception #1 (Emergencies): In the event of an emergency (i.e.: accident requiring emergency care), you are allowed to receive additional pain medication. However, you are responsible for: As soon as you are able, call our office [phone], at any time of the day or night, and leave a message stating your name, the date and nature of the emergency, and the name and dose of the medication prescribed. In the event that your call is answered by a member of our staff, make sure to document and save the date, time, and the name of the person that took your information.  Exception #2 (Planned Surgery): In the event that you are scheduled by another doctor or dentist to have any type of surgery or procedure, you are allowed (for a period no longer than 30 days), to receive additional pain medication, for the acute post-op pain. However, in this case, you are responsible for picking up a copy of our "Post-op Pain Management for Surgeons" handout, and giving it to your surgeon or dentist. This document is available at our office, and does not require an appointment to obtain it. Simply go to   our office during business hours (Monday-Thursday from 8:00 AM to 4:00 PM) (Friday 8:00 AM to 12:00 Noon) or if you have a scheduled appointment with us, prior to your surgery, and ask for it by name. In addition, you are responsible for: calling our office [phone], at any time of the day or night, and leaving a message stating your name, name of your surgeon, type of surgery, and date of procedure or surgery. Failure to comply with your responsibilities may result in termination of therapy involving the controlled  substances. Medication Agreement Violation. Following the above rules, including your responsibilities will help you in avoiding a Medication Agreement Violation ("Breaking your Pain Medication Contract").  Consequences:  Not following the above rules may result in permanent discontinuation of medication prescription therapy.  *Opioid medications include: morphine, codeine, oxycodone, oxymorphone, hydrocodone, hydromorphone, meperidine, tramadol, tapentadol, buprenorphine, fentanyl, methadone. **Benzodiazepine medications include: diazepam (Valium), alprazolam (Xanax), clonazepam (Klonopine), lorazepam (Ativan), clorazepate (Tranxene), chlordiazepoxide (Librium), estazolam (Prosom), oxazepam (Serax), temazepam (Restoril), triazolam (Halcion) (Last updated: 05/30/2022) ______________________________________________________________________    ______________________________________________________________________  Medication Recommendations and Reminders  Applies to: All patients receiving prescriptions (written and/or electronic).  Medication Rules & Regulations: You are responsible for reading, knowing, and following our "Medication Rules" document. These exist for your safety and that of others. They are not flexible and neither are we. Dismissing or ignoring them is an act of "non-compliance" that may result in complete and irreversible termination of such medication therapy. For safety reasons, "non-compliance" will not be tolerated. As with the [place] fundamental legal principle of "ignorance of the law is no defense", we will accept no excuses for not having read and knowing the content of documents provided to you by our practice.  Pharmacy of record:  Definition: This is the pharmacy where your electronic prescriptions will be sent.  We do not endorse any particular pharmacy. It is up to you and your insurance to decide what pharmacy to use.  We do not restrict you in your choice of  pharmacy. However, once we write for your prescriptions, we will NOT be re-sending more prescriptions to fix restricted supply problems created by your pharmacy, or your insurance.  The pharmacy listed in the electronic medical record should be the one where you want electronic prescriptions to be sent. If you choose to change pharmacy, simply notify our nursing staff. Changes will be made only during your regular appointments and not over the phone.  Recommendations: Keep all of your pain medications in a safe place, under lock and key, even if you live alone. We will NOT replace lost, stolen, or damaged medication. We do not accept "Police Reports" as proof of medications having been stolen. After you fill your prescription, take 1 week's worth of pills and put them away in a safe place. You should keep a separate, properly labeled bottle for this purpose. The remainder should be kept in the original bottle. Use this as your primary supply, until it runs out. Once it's gone, then you know that you have 1 week's worth of medicine, and it is time to come in for a prescription refill. If you do this correctly, it is unlikely that you will ever run out of medicine. To make sure that the above recommendation works, it is very important that you make sure your medication refill appointments are scheduled at least 1 week before you run out of medicine. To do this in an effective manner, make sure that you do not leave the office without   scheduling your next medication management appointment. Always ask the nursing staff to show you in your prescription , when your medication will be running out. Then arrange for the receptionist to get you a return appointment, at least 7 days before you run out of medicine. Do not wait until you have 1 or 2 pills left, to come in. This is very poor planning and does not take into consideration that we may need to cancel appointments due to bad weather, sickness, or emergencies  affecting our staff. DO NOT ACCEPT A "Partial Fill": If for any reason your pharmacy does not have enough pills/tablets to completely fill or refill your prescription, do not allow for a "partial fill". The law allows the pharmacy to complete that prescription within 72 hours, without requiring a new prescription. If they do not fill the rest of your prescription within those 72 hours, you will need a separate prescription to fill the remaining amount, which we will NOT provide. If the reason for the partial fill is your insurance, you will need to talk to the pharmacist about payment alternatives for the remaining tablets, but again, DO NOT ACCEPT A PARTIAL FILL, unless you can trust your pharmacist to obtain the remainder of the pills within 72 hours.  Prescription refills and/or changes in medication(s):  Prescription refills, and/or changes in dose or medication, will be conducted only during scheduled medication management appointments. (Applies to both, written and electronic prescriptions.) No refills on procedure days. No medication will be changed or started on procedure days. No changes, adjustments, and/or refills will be conducted on a procedure day. Doing so will interfere with the diagnostic portion of the procedure. No phone refills. No medications will be "called into the pharmacy". No Fax refills. No weekend refills. No Holliday refills. No after hours refills.  Remember:  Business hours are:  Monday to Thursday 8:00 AM to 4:00 PM Provider's Schedule: Charlee Squibb, MD - Appointments are:  Medication management: Monday and Wednesday 8:00 AM to 4:00 PM Procedure day: Tuesday and Thursday 7:30 AM to 4:00 PM Bilal Lateef, MD - Appointments are:  Medication management: Tuesday and Thursday 8:00 AM to 4:00 PM Procedure day: Monday and Wednesday 7:30 AM to 4:00 PM (Last update: 05/30/2022) ______________________________________________________________________    

## 2023-01-29 ENCOUNTER — Ambulatory Visit: Payer: Medicare Other | Attending: Pain Medicine | Admitting: Pain Medicine

## 2023-01-29 ENCOUNTER — Encounter: Payer: Self-pay | Admitting: Pain Medicine

## 2023-01-29 VITALS — BP 124/72 | Temp 97.0°F | Resp 18 | Ht 67.0 in | Wt 212.0 lb

## 2023-01-29 DIAGNOSIS — G894 Chronic pain syndrome: Secondary | ICD-10-CM

## 2023-01-29 DIAGNOSIS — G8929 Other chronic pain: Secondary | ICD-10-CM | POA: Diagnosis not present

## 2023-01-29 DIAGNOSIS — M5412 Radiculopathy, cervical region: Secondary | ICD-10-CM

## 2023-01-29 DIAGNOSIS — M47816 Spondylosis without myelopathy or radiculopathy, lumbar region: Secondary | ICD-10-CM | POA: Diagnosis not present

## 2023-01-29 DIAGNOSIS — M25511 Pain in right shoulder: Secondary | ICD-10-CM | POA: Diagnosis not present

## 2023-01-29 DIAGNOSIS — M542 Cervicalgia: Secondary | ICD-10-CM | POA: Diagnosis not present

## 2023-01-29 DIAGNOSIS — G4486 Cervicogenic headache: Secondary | ICD-10-CM

## 2023-01-29 DIAGNOSIS — M533 Sacrococcygeal disorders, not elsewhere classified: Secondary | ICD-10-CM | POA: Diagnosis not present

## 2023-01-29 DIAGNOSIS — M25512 Pain in left shoulder: Secondary | ICD-10-CM

## 2023-01-29 DIAGNOSIS — M961 Postlaminectomy syndrome, not elsewhere classified: Secondary | ICD-10-CM | POA: Diagnosis not present

## 2023-01-29 DIAGNOSIS — M545 Low back pain, unspecified: Secondary | ICD-10-CM | POA: Insufficient documentation

## 2023-01-29 DIAGNOSIS — M25562 Pain in left knee: Secondary | ICD-10-CM

## 2023-01-29 DIAGNOSIS — Z79899 Other long term (current) drug therapy: Secondary | ICD-10-CM | POA: Diagnosis not present

## 2023-01-29 DIAGNOSIS — Z79891 Long term (current) use of opiate analgesic: Secondary | ICD-10-CM | POA: Diagnosis not present

## 2023-01-29 DIAGNOSIS — M25561 Pain in right knee: Secondary | ICD-10-CM | POA: Diagnosis not present

## 2023-01-29 MED ORDER — OXYCODONE HCL 10 MG PO TABS
10.0000 mg | ORAL_TABLET | Freq: Three times a day (TID) | ORAL | 0 refills | Status: DC | PRN
Start: 2023-03-04 — End: 2023-04-29

## 2023-01-29 MED ORDER — OXYCODONE HCL 10 MG PO TABS
10.0000 mg | ORAL_TABLET | Freq: Three times a day (TID) | ORAL | 0 refills | Status: DC | PRN
Start: 2023-02-02 — End: 2023-04-29

## 2023-01-29 MED ORDER — OXYCODONE HCL 10 MG PO TABS
10.0000 mg | ORAL_TABLET | Freq: Three times a day (TID) | ORAL | 0 refills | Status: DC | PRN
Start: 2023-04-03 — End: 2023-04-29

## 2023-01-29 NOTE — Progress Notes (Signed)
Nursing Pain Medication Assessment:  Safety precautions to be maintained throughout the outpatient stay will include: orient to surroundings, keep bed in low position, maintain call bell within reach at all times, provide assistance with transfer out of bed and ambulation.  Medication Inspection Compliance: Pill count conducted under aseptic conditions, in front of the patient. Neither the pills nor the bottle was removed from the patient's sight at any time. Once count was completed pills were immediately returned to the patient in their original bottle.  Medication: Oxycodone IR Pill/Patch Count:  8 of 90 pills remain Pill/Patch Appearance: Markings consistent with prescribed medication Bottle Appearance: Standard pharmacy container. Clearly labeled. Filled Date: 05 / 20 / 2024 Last Medication intake:  Today

## 2023-02-01 LAB — TOXASSURE SELECT 13 (MW), URINE

## 2023-02-06 ENCOUNTER — Other Ambulatory Visit: Payer: Self-pay | Admitting: Family Medicine

## 2023-02-06 DIAGNOSIS — G894 Chronic pain syndrome: Secondary | ICD-10-CM

## 2023-02-06 NOTE — Telephone Encounter (Signed)
Naproxen Last filled:  12/22/22, #90 Last OV:  12/25/22, CPE Next OV:  04/30/23, 4 mo DM f/u

## 2023-03-01 IMAGING — NM NM HEPATOBILIARY SCAN
2 series · 12 of 12 positions shown · non-contrast
Comparison: ERCP October 26, 2020.

CLINICAL DATA: Recurrent abdominal pain post cholecystectomy.

EXAM:
NUCLEAR MEDICINE HEPATOBILIARY IMAGING
TECHNIQUE: Sequential images of the abdomen were obtained [DATE] minutes
following intravenous administration of radiopharmaceutical.
RADIOPHARMACEUTICALS:  7.6 mCi [id number]  Choletec IV

[he hepatobiliary · 4.52mm/px · 6 of 58 frames shown (1 of 2)]
[frame 5/58]
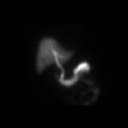
[frame 15/58]
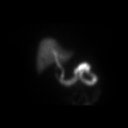
[frame 25/58]
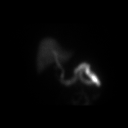
[frame 34/58]
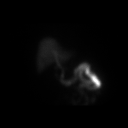
[frame 44/58]
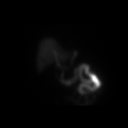
[frame 54/58]
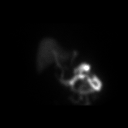

[he hepatobiliary · 4.52mm/px · 6 of 60 frames shown (2 of 2)]
[frame 6/60]
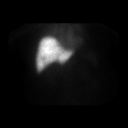
[frame 16/60]
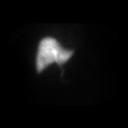
[frame 26/60]
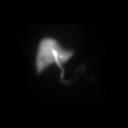
[frame 36/60]
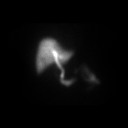
[frame 46/60]
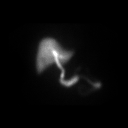
[frame 56/60]
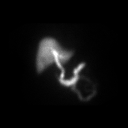

[12 of 12 positions shown; findings below may reference images not displayed]

FINDINGS: Minimally prolonged blood pool activity with mildly delayed biliary
excretion of activity by the liver. No scintigraphic findings to
suggest biliary leak. Biliary activity passes into small bowel,
consistent with patent common bile duct. There is no evidence of
delayed biliary to bowel transit, in there is persistent decrease in
common duct activity during delayed imaging the after its peak at 50
minutes. Small amount of enterogastric biliary reflux visualized at
110-120 minutes on delayed imaging.
IMPRESSION: No scintigraphic evidence of biliary leak or sphincter of Oddi
dysfunction.

Minimally prolonged blood pool activity and biliary clearance of
activity by the liver suggestive of hepatocellular dysfunction.

Small amount of enterogastric biliary reflux seen on delayed imaging
which is nonspecific and can be a normal finding but also can be
indicative of alkaline gastritis.

## 2023-03-13 DIAGNOSIS — M79671 Pain in right foot: Secondary | ICD-10-CM | POA: Diagnosis not present

## 2023-03-13 DIAGNOSIS — L600 Ingrowing nail: Secondary | ICD-10-CM | POA: Diagnosis not present

## 2023-03-13 DIAGNOSIS — L03031 Cellulitis of right toe: Secondary | ICD-10-CM | POA: Diagnosis not present

## 2023-03-13 DIAGNOSIS — M79674 Pain in right toe(s): Secondary | ICD-10-CM | POA: Diagnosis not present

## 2023-03-20 ENCOUNTER — Ambulatory Visit (INDEPENDENT_AMBULATORY_CARE_PROVIDER_SITE_OTHER): Payer: Medicare Other

## 2023-03-20 DIAGNOSIS — I495 Sick sinus syndrome: Secondary | ICD-10-CM

## 2023-03-20 LAB — CUP PACEART REMOTE DEVICE CHECK
Battery Remaining Longevity: 98 mo
Battery Voltage: 2.99 V
Brady Statistic AP VP Percent: 0.51 %
Brady Statistic AP VS Percent: 98.98 %
Brady Statistic AS VP Percent: 0 %
Brady Statistic AS VS Percent: 0.51 %
Brady Statistic RA Percent Paced: 99.89 %
Brady Statistic RV Percent Paced: 0.51 %
Date Time Interrogation Session: 20240813013059
Implantable Lead Connection Status: 753985
Implantable Lead Connection Status: 753985
Implantable Lead Implant Date: 20190509
Implantable Lead Implant Date: 20190509
Implantable Lead Location: 753859
Implantable Lead Location: 753862
Implantable Lead Model: 5092
Implantable Lead Model: 5592
Implantable Pulse Generator Implant Date: 20190509
Lead Channel Impedance Value: 361 Ohm
Lead Channel Impedance Value: 361 Ohm
Lead Channel Impedance Value: 399 Ohm
Lead Channel Impedance Value: 570 Ohm
Lead Channel Pacing Threshold Amplitude: 0.75 V
Lead Channel Pacing Threshold Amplitude: 0.875 V
Lead Channel Pacing Threshold Pulse Width: 0.4 ms
Lead Channel Pacing Threshold Pulse Width: 0.4 ms
Lead Channel Sensing Intrinsic Amplitude: 2.5 mV
Lead Channel Sensing Intrinsic Amplitude: 2.5 mV
Lead Channel Sensing Intrinsic Amplitude: 6.75 mV
Lead Channel Sensing Intrinsic Amplitude: 6.75 mV
Lead Channel Setting Pacing Amplitude: 1.75 V
Lead Channel Setting Pacing Amplitude: 2 V
Lead Channel Setting Pacing Pulse Width: 0.4 ms
Lead Channel Setting Sensing Sensitivity: 2 mV
Zone Setting Status: 755011
Zone Setting Status: 755011

## 2023-03-22 ENCOUNTER — Encounter (INDEPENDENT_AMBULATORY_CARE_PROVIDER_SITE_OTHER): Payer: Self-pay

## 2023-03-25 ENCOUNTER — Other Ambulatory Visit: Payer: Self-pay | Admitting: Family Medicine

## 2023-03-25 DIAGNOSIS — G894 Chronic pain syndrome: Secondary | ICD-10-CM

## 2023-03-26 NOTE — Telephone Encounter (Signed)
Patient states that he is down to taking 1 tablet daily. He is due for refill ok to refill as requested.

## 2023-03-29 DIAGNOSIS — M79671 Pain in right foot: Secondary | ICD-10-CM | POA: Diagnosis not present

## 2023-03-29 DIAGNOSIS — L03031 Cellulitis of right toe: Secondary | ICD-10-CM | POA: Diagnosis not present

## 2023-03-29 DIAGNOSIS — M79674 Pain in right toe(s): Secondary | ICD-10-CM | POA: Diagnosis not present

## 2023-04-04 NOTE — Progress Notes (Signed)
Remote pacemaker transmission.   

## 2023-04-16 DIAGNOSIS — M79671 Pain in right foot: Secondary | ICD-10-CM | POA: Diagnosis not present

## 2023-04-16 DIAGNOSIS — L600 Ingrowing nail: Secondary | ICD-10-CM | POA: Diagnosis not present

## 2023-04-16 DIAGNOSIS — M79674 Pain in right toe(s): Secondary | ICD-10-CM | POA: Diagnosis not present

## 2023-04-16 DIAGNOSIS — L565 Disseminated superficial actinic porokeratosis (DSAP): Secondary | ICD-10-CM | POA: Diagnosis not present

## 2023-04-29 NOTE — Patient Instructions (Signed)
____________________________________________________________________________________________  Opioid Pain Medication Update  To: All patients taking opioid pain medications. (I.e.: hydrocodone, hydromorphone, oxycodone, oxymorphone, morphine, codeine, methadone, tapentadol, tramadol, buprenorphine, fentanyl, etc.)  Re: Updated review of side effects and adverse reactions of opioid analgesics, as well as new information about long term effects of this class of medications.  Direct risks of long-term opioid therapy are not limited to opioid addiction and overdose. Potential medical risks include serious fractures, breathing problems during sleep, hyperalgesia, immunosuppression, chronic constipation, bowel obstruction, myocardial infarction, and tooth decay secondary to xerostomia.  Unpredictable adverse effects that can occur even if you take your medication correctly: Cognitive impairment, respiratory depression, and death. Most people think that if they take their medication "correctly", and "as instructed", that they will be safe. Nothing could be farther from the truth. In reality, a significant amount of recorded deaths associated with the use of opioids has occurred in individuals that had taken the medication for a long time, and were taking their medication correctly. The following are examples of how this can happen: Patient taking his/her medication for a long time, as instructed, without any side effects, is given a certain antibiotic or another unrelated medication, which in turn triggers a "Drug-to-drug interaction" leading to disorientation, cognitive impairment, impaired reflexes, respiratory depression or an untoward event leading to serious bodily harm or injury, including death.  Patient taking his/her medication for a long time, as instructed, without any side effects, develops an acute impairment of liver and/or kidney function. This will lead to a rapid inability of the body to  breakdown and eliminate their pain medication, which will result in effects similar to an "overdose", but with the same medicine and dose that they had always taken. This again may lead to disorientation, cognitive impairment, impaired reflexes, respiratory depression or an untoward event leading to serious bodily harm or injury, including death.  A similar problem will occur with patients as they grow older and their liver and kidney function begins to decrease as part of the aging process.  Background information: Historically, the original case for using long-term opioid therapy to treat chronic noncancer pain was based on safety assumptions that subsequent experience has called into question. In 1996, the American Pain Society and the American Academy of Pain Medicine issued a consensus statement supporting long-term opioid therapy. This statement acknowledged the dangers of opioid prescribing but concluded that the risk for addiction was low; respiratory depression induced by opioids was short-lived, occurred mainly in opioid-naive patients, and was antagonized by pain; tolerance was not a common problem; and efforts to control diversion should not constrain opioid prescribing. This has now proven to be wrong. Experience regarding the risks for opioid addiction, misuse, and overdose in community practice has failed to support these assumptions.  According to the Centers for Disease Control and Prevention, fatal overdoses involving opioid analgesics have increased sharply over the past decade. Currently, more than 96,700 people die from drug overdoses every year. Opioids are a factor in 7 out of every 10 overdose deaths. Deaths from drug overdose have surpassed motor vehicle accidents as the leading cause of death for individuals between the ages of 80 and 61.  Clinical data suggest that neuroendocrine dysfunction may be very common in both men and women, potentially causing hypogonadism, erectile  dysfunction, infertility, decreased libido, osteoporosis, and depression. Recent studies linked higher opioid dose to increased opioid-related mortality. Controlled observational studies reported that long-term opioid therapy may be associated with increased risk for cardiovascular events. Subsequent meta-analysis concluded  that the safety of long-term opioid therapy in elderly patients has not been proven.   Side Effects and adverse reactions: Common side effects: Drowsiness (sedation). Dizziness. Nausea and vomiting. Constipation. Physical dependence -- Dependence often manifests with withdrawal symptoms when opioids are discontinued or decreased. Tolerance -- As you take repeated doses of opioids, you require increased medication to experience the same effect of pain relief. Respiratory depression -- This can occur in healthy people, especially with higher doses. However, people with COPD, asthma or other lung conditions may be even more susceptible to fatal respiratory impairment.  Uncommon side effects: An increased sensitivity to feeling pain and extreme response to pain (hyperalgesia). Chronic use of opioids can lead to this. Delayed gastric emptying (the process by which the contents of your stomach are moved into your small intestine). Muscle rigidity. Immune system and hormonal dysfunction. Quick, involuntary muscle jerks (myoclonus). Arrhythmia. Itchy skin (pruritus). Dry mouth (xerostomia).  Long-term side effects: Chronic constipation. Sleep-disordered breathing (SDB). Increased risk of bone fractures. Hypothalamic-pituitary-adrenal dysregulation. Increased risk of overdose.  RISKS: Respiratory depression and death: Opioids increase the risk of respiratory depression and death.  Drug-to-drug interactions: Opioids are relatively contraindicated in combination with benzodiazepines, sleep inducers, and other central nervous system depressants. Other classes of medications  (i.e.: certain antibiotics and even over-the-counter medications) may also trigger or induce respiratory depression in some patients.  Medical conditions: Patients with pre-existing respiratory problems are at higher risk of respiratory failure and/or depression when in combination with opioid analgesics. Opioids are relatively contraindicated in some medical conditions such as central sleep apnea.   Fractures and Falls:  Opioids increase the risk and incidence of falls. This is of particular importance in elderly patients.  Endocrine System:  Long-term administration is associated with endocrine abnormalities (endocrinopathies). (Also known as Opioid-induced Endocrinopathy) Influences on both the hypothalamic-pituitary-adrenal axis?and the hypothalamic-pituitary-gonadal axis have been demonstrated with consequent hypogonadism and adrenal insufficiency in both sexes. Hypogonadism and decreased levels of dehydroepiandrosterone sulfate have been reported in men and women. Endocrine effects include: Amenorrhoea in women (abnormal absence of menstruation) Reduced libido in both sexes Decreased sexual function Erectile dysfunction in men Hypogonadisms (decreased testicular function with shrinkage of testicles) Infertility Depression and fatigue Loss of muscle mass Anxiety Depression Immune suppression Hyperalgesia Weight gain Anemia Osteoporosis Patients (particularly women of childbearing age) should avoid opioids. There is insufficient evidence to recommend routine monitoring of asymptomatic patients taking opioids in the long-term for hormonal deficiencies.  Immune System: Human studies have demonstrated that opioids have an immunomodulating effect. These effects are mediated via opioid receptors both on immune effector cells and in the central nervous system. Opioids have been demonstrated to have adverse effects on antimicrobial response and anti-tumour surveillance. Buprenorphine has  been demonstrated to have no impact on immune function.  Opioid Induced Hyperalgesia: Human studies have demonstrated that prolonged use of opioids can lead to a state of abnormal pain sensitivity, sometimes called opioid induced hyperalgesia (OIH). Opioid induced hyperalgesia is not usually seen in the absence of tolerance to opioid analgesia. Clinically, hyperalgesia may be diagnosed if the patient on long-term opioid therapy presents with increased pain. This might be qualitatively and anatomically distinct from pain related to disease progression or to breakthrough pain resulting from development of opioid tolerance. Pain associated with hyperalgesia tends to be more diffuse than the pre-existing pain and less defined in quality. Management of opioid induced hyperalgesia requires opioid dose reduction.  Cancer: Chronic opioid therapy has been associated with an increased risk of cancer  among noncancer patients with chronic pain. This association was more evident in chronic strong opioid users. Chronic opioid consumption causes significant pathological changes in the small intestine and colon. Epidemiological studies have found that there is a link between opium dependence and initiation of gastrointestinal cancers. Cancer is the second leading cause of death after cardiovascular disease. Chronic use of opioids can cause multiple conditions such as GERD, immunosuppression and renal damage as well as carcinogenic effects, which are associated with the incidence of cancers.   Mortality: Long-term opioid use has been associated with increased mortality among patients with chronic non-cancer pain (CNCP).  Prescription of long-acting opioids for chronic noncancer pain was associated with a significantly increased risk of all-cause mortality, including deaths from causes other than overdose.  Reference: Von Korff M, Kolodny A, Deyo RA, Chou R. Long-term opioid therapy reconsidered. Ann Intern Med. 2011  Sep 6;155(5):325-8. doi: 10.7326/[id number]. PMID: 64403474; PMCID: [id number]. Randon Goldsmith, Hayward RA, Dunn KM, [place] KP. Risk of adverse events in patients prescribed long-term opioids: A cohort study in the [place] [facility] Research Datalink. Eur J Pain. 2019 May;23(5):908-922. doi: 10.1002/ejp.1357. Epub 2019 Jan 31. PMID: 43329518. Colameco S, Coren JS, Ciervo CA. Continuous opioid treatment for chronic noncancer pain: a time for moderation in prescribing. Postgrad Med. 2009 Jul;121(4):61-6. doi: 10.3810/pgm.2009.07.2032. PMID: 84166063. William Hamburger RN, [place] SD, Blazina I, Cristopher Peru, Bougatsos C, Deyo RA. The effectiveness and risks of long-term opioid therapy for chronic pain: a systematic review for a [employer] of [employer] Pathways to [employer]. Ann Intern Med. 2015 Feb 17;162(4):276-86. doi: 10.7326/M14-2559. PMID: 01601093. Caryl Bis [facility], Makuc DM. NCHS Data Brief No. 22. [place]: Centers for Disease Control and Prevention; 2009. Sep, Increase in Fatal Poisonings Involving Opioid Analgesics in the [place], 1999-2006. Song IA, Choi HR, Oh TK. Long-term opioid use and mortality in patients with chronic non-cancer pain: Ten-year follow-up study in [place] from 2010 through 2019. EClinicalMedicine. 2022 Jul 18;51:101558. doi: 10.1016/j.eclinm.2022.235573. PMID: 22025427; PMCID: [id number]. Huser, W., Schubert, T., Vogelmann, T. et al. All-cause mortality in patients with long-term opioid therapy compared with non-opioid analgesics for chronic non-cancer pain: a database study. BMC Med 18, 162 (2020). http://lester.info/ Rashidian H, Karie Kirks, Malekzadeh R, Haghdoost AA. An Ecological Study of the Association between Opiate Use and Incidence of Cancers. Addict Health. 2016 Fall;8(4):252-260. PMID: 51761607; PMCID: [id number].  Our Goal: Our goal is to control your  pain with means other than the use of opioid pain medications.  Our Recommendation: Talk to your physician about coming off of these medications. We can assist you with the tapering down and stopping these medicines. Based on the new information, even if you cannot completely stop the medication, a decrease in the dose may be associated with a lesser risk. Ask for other means of controlling the pain. Decrease or eliminate those factors that significantly contribute to your pain such as smoking, obesity, and a diet heavily tilted towards "inflammatory" nutrients.  Last Updated: 02/12/2023   ____________________________________________________________________________________________     ____________________________________________________________________________________________  National Pain Medication Shortage  The [place] is experiencing worsening drug shortages. These have had a negative widespread effect on patient care and treatment. Not expected to improve any time soon. Predicted to last past 2029.   Drug shortage list (generic names) Oxycodone IR Oxycodone/APAP Oxymorphone IR Hydromorphone Hydrocodone/APAP Morphine  Where is the problem?  Manufacturing and supply level.  Will this shortage affect you?  Only if you  take any of the above pain medications.  How? You may be unable to fill your prescription.  Your pharmacist may offer a "partial fill" of your prescription. (Warning: Do not accept partial fills.) Prescriptions partially filled cannot be transferred to another pharmacy. Read our Medication Rules and Regulation. Depending on how much medicine you are dependent on, you may experience withdrawals when unable to get the medication.  Recommendations: Consider ending your dependence on opioid pain medications. Ask your pain specialist to assist you with the process. Consider switching to a medication currently not in shortage, such as Buprenorphine. Talk to your pain  specialist about this option. Consider decreasing your pain medication requirements by managing tolerance thru "Drug Holidays". This may help minimize withdrawals, should you run out of medicine. Control your pain thru the use of non-pharmacological interventional therapies.   Your prescriber: Prescribers cannot be blamed for shortages. Medication manufacturing and supply issues cannot be fixed by the prescriber.   NOTE: The prescriber is not responsible for supplying the medication, or solving supply issues. Work with your pharmacist to solve it. The patient is responsible for the decision to take or continue taking the medication and for identifying and securing a legal supply source. By law, supplying the medication is the job and responsibility of the pharmacy. The prescriber is responsible for the evaluation, monitoring, and prescribing of these medications.   Prescribers will NOT: Re-issue prescriptions that have been partially filled. Re-issue prescriptions already sent to a pharmacy.  Re-send prescriptions to a different pharmacy because yours did not have your medication. Ask pharmacist to order more medicine or transfer the prescription to another pharmacy. (Read below.)  New 2023 regulation: "April 07, 2022 Revised Regulation Allows DEA-Registered Pharmacies to Transfer Electronic Prescriptions at a Patient's Request DEA Headquarters Division - Public Information Office Patients now have the ability to request their electronic prescription be transferred to another pharmacy without having to go back to their practitioner to initiate the request. This revised regulation went into effect on Monday, April 03, 2022.     At a patient's request, a DEA-registered retail pharmacy can now transfer an electronic prescription for a controlled substance (schedules II-V) to another DEA-registered retail pharmacy. Prior to this change, patients would have to go through their practitioner to  cancel their prescription and have it re-issued to a different pharmacy. The process was taxing and time consuming for both patients and practitioners.    The Drug Enforcement Administration [facility]) published its intent to revise the process for transferring electronic prescriptions on June 25, 2020.  The final rule was published in the federal register on March 02, 2022 and went into effect 30 days later.  Under the final rule, a prescription can only be transferred once between pharmacies, and only if allowed under existing state or other applicable law. The prescription must remain in its electronic form; may not be altered in any way; and the transfer must be communicated directly between two licensed pharmacists. It's important to note, any authorized refills transfer with the original prescription, which means the entire prescription will be filled at the same pharmacy".  Reference: [email or web address].is [facility] website announcement)  [email or web address] [employer] of Justice)   [employer] / Vol. 88, No. 143 / Thursday, March 02, 2022 / Rules and Regulations DEPARTMENT OF JUSTICE  Drug Enforcement Administration  21 CFR Part 1306  [Docket No. [id number]]  RIN [id number] Transfer of Electronic Prescriptions for Schedules II-V Controlled Substances Between Pharmacies for Initial Filling  ____________________________________________________________________________________________  ____________________________________________________________________________________________  Transfer of Pain Medication between Pharmacies  Re: 2023 DEA Clarification on existing regulation  Published on DEA Website: April 07, 2022  Title: Revised Regulation Allows DEA-Registered Pharmacies to Electrical engineer Prescriptions at a Patient's  Request DEA Headquarters Division - [employer]  "Patients now have the ability to request their electronic prescription be transferred to another pharmacy without having to go back to their practitioner to initiate the request. This revised regulation went into effect on Monday, April 03, 2022.     At a patient's request, a DEA-registered retail pharmacy can now transfer an electronic prescription for a controlled substance (schedules II-V) to another DEA-registered retail pharmacy. Prior to this change, patients would have to go through their practitioner to cancel their prescription and have it re-issued to a different pharmacy. The process was taxing and time consuming for both patients and practitioners.    The Drug Enforcement Administration [facility]) published its intent to revise the process for transferring electronic prescriptions on June 25, 2020.  The final rule was published in the federal register on March 02, 2022 and went into effect 30 days later.  Under the final rule, a prescription can only be transferred once between pharmacies, and only if allowed under existing state or other applicable law. The prescription must remain in its electronic form; may not be altered in any way; and the transfer must be communicated directly between two licensed pharmacists. It's important to note, any authorized refills transfer with the original prescription, which means the entire prescription will be filled at the same pharmacy."    REFERENCES: 1. DEA website announcement [email or web address]  2. Department of Justice website  [email or web address]  3. DEPARTMENT OF JUSTICE Drug Enforcement Administration 21 CFR Part 1306 [Docket No. [id number]] RIN [id number] "Transfer of Electronic Prescriptions for Schedules II-V Controlled Substances  Between Pharmacies for Initial Filling"  ____________________________________________________________________________________________     _______________________________________________________________________  Medication Rules  Purpose: To inform patients, and their family members, of our medication rules and regulations.  Applies to: All patients receiving prescriptions from our practice (written or electronic).  Pharmacy of record: This is the pharmacy where your electronic prescriptions will be sent. Make sure we have the correct one.  Electronic prescriptions: In compliance with the [facility] Strengthen Opioid Misuse Prevention (STOP) Act of 2017 (Session Conni Elliot [phone]), effective August 07, 2018, all controlled substances must be electronically prescribed. Written prescriptions, faxing, or calling prescriptions to a pharmacy will no longer be done.  Prescription refills: These will be provided only during in-person appointments. No medications will be renewed without a "face-to-face" evaluation with your provider. Applies to all prescriptions.  NOTE: The following applies primarily to controlled substances (Opioid* Pain Medications).   Type of encounter (visit): For patients receiving controlled substances, face-to-face visits are required. (Not an option and not up to the patient.)  Patient's responsibilities: Pain Pills: Bring all pain pills to every appointment (except for procedure appointments). Pill Bottles: Bring pills in original pharmacy bottle. Bring bottle, even if empty. Always bring the bottle of the most recent fill.  Medication refills: You are responsible for knowing and keeping track of what medications you are taking and when is it that you will need a refill. The day before your appointment: write a list of all prescriptions that need to be refilled. The day of the appointment: give the list to the admitting nurse. Prescriptions will be written only  during appointments. No prescriptions will be written on procedure days. If you forget a  medication: it will not be "Called in", "Faxed", or "electronically sent". You will need to get another appointment to get these prescribed. No early refills. Do not call asking to have your prescription filled early. Partial  or short prescriptions: Occasionally your pharmacy may not have enough pills to fill your prescription.  NEVER ACCEPT a partial fill or a prescription that is short of the total amount of pills that you were prescribed.  With controlled substances the law allows 72 hours for the pharmacy to complete the prescription.  If the prescription is not completed within 72 hours, the pharmacist will require a new prescription to be written. This means that you will be short on your medicine and we WILL NOT send another prescription to complete your original prescription.  Instead, request the pharmacy to send a carrier to a nearby branch to get enough medication to provide you with your full prescription. Prescription Accuracy: You are responsible for carefully inspecting your prescriptions before leaving our office. Have the discharge nurse carefully go over each prescription with you, before taking them home. Make sure that your name is accurately spelled, that your address is correct. Check the name and dose of your medication to make sure it is accurate. Check the number of pills, and the written instructions to make sure they are clear and accurate. Make sure that you are given enough medication to last until your next medication refill appointment. Taking Medication: Take medication as prescribed. When it comes to controlled substances, taking less pills or less frequently than prescribed is permitted and encouraged. Never take more pills than instructed. Never take the medication more frequently than prescribed.  Inform other Doctors: Always inform, all of your healthcare providers, of all the  medications you take. Pain Medication from other Providers: You are not allowed to accept any additional pain medication from any other Doctor or Healthcare provider. There are two exceptions to this rule. (see below) In the event that you require additional pain medication, you are responsible for notifying us, as stated below. Cough Medicine: Often these contain an opioid, such as codeine or hydrocodone. Never accept or take cough medicine containing these opioids if you are already taking an opioid* medication. The combination may cause respiratory failure and death. Medication Agreement: You are responsible for carefully reading and following our Medication Agreement. This must be signed before receiving any prescriptions from our practice. Safely store a copy of your signed Agreement. Violations to the Agreement will result in no further prescriptions. (Additional copies of our Medication Agreement are available upon request.) Laws, Rules, & Regulations: All patients are expected to follow all [address] and [employer], [employer], Rules, [place] [employer]. Ignorance of the Laws does not constitute a valid excuse.  Illegal drugs and Controlled Substances: The use of illegal substances (including, but not limited to marijuana and its derivatives) and/or the illegal use of any controlled substances is strictly prohibited. Violation of this rule may result in the immediate and permanent discontinuation of any and all prescriptions being written by our practice. The use of any illegal substances is prohibited. Adopted CDC guidelines & recommendations: Target dosing levels will be at or below 60 MME/day. Use of benzodiazepines** is not recommended.  Exceptions: There are only two exceptions to the rule of not receiving pain medications from other Healthcare Providers. Exception #1 (Emergencies): In the event of an emergency (i.e.: accident requiring emergency care), you are allowed to receive additional pain  medication. However, you are responsible for: As soon as  you are able, call our office [phone], at any time of the day or night, and leave a message stating your name, the date and nature of the emergency, and the name and dose of the medication prescribed. In the event that your call is answered by a member of our staff, make sure to document and save the date, time, and the name of the person that took your information.  Exception #2 (Planned Surgery): In the event that you are scheduled by another doctor or dentist to have any type of surgery or procedure, you are allowed (for a period no longer than 30 days), to receive additional pain medication, for the acute post-op pain. However, in this case, you are responsible for picking up a copy of our "Post-op Pain Management for Surgeons" handout, and giving it to your surgeon or dentist. This document is available at our office, and does not require an appointment to obtain it. Simply go to our office during business hours (Monday-Thursday from 8:00 AM to 4:00 PM) (Friday 8:00 AM to 12:00 Noon) or if you have a scheduled appointment with [place], prior to your surgery, and ask for it by name. In addition, you are responsible for: calling our office [phone], at any time of the day or night, and leaving a message stating your name, name of your surgeon, type of surgery, and date of procedure or surgery. Failure to comply with your responsibilities may result in termination of therapy involving the controlled substances. Medication Agreement Violation. Following the above rules, including your responsibilities will help you in avoiding a Medication Agreement Violation ("Breaking your Pain Medication Contract").  Consequences:  Not following the above rules may result in permanent discontinuation of medication prescription therapy.  *Opioid medications include: morphine, codeine, oxycodone, oxymorphone, hydrocodone, hydromorphone, meperidine, tramadol,  tapentadol, buprenorphine, fentanyl, methadone. **Benzodiazepine medications include: diazepam (Valium), alprazolam (Xanax), clonazepam (Klonopine), lorazepam (Ativan), clorazepate (Tranxene), chlordiazepoxide (Librium), estazolam (Prosom), oxazepam (Serax), temazepam (Restoril), triazolam (Halcion) (Last updated: 05/30/2022) ______________________________________________________________________    ______________________________________________________________________  Medication Recommendations and Reminders  Applies to: All patients receiving prescriptions (written and/or electronic).  Medication Rules & Regulations: You are responsible for reading, knowing, and following our "Medication Rules" document. These exist for your safety and that of others. They are not flexible and neither are we. Dismissing or ignoring them is an act of "non-compliance" that may result in complete and irreversible termination of such medication therapy. For safety reasons, "non-compliance" will not be tolerated. As with the [place] fundamental legal principle of "ignorance of the law is no defense", we will accept no excuses for not having read and knowing the content of documents provided to you by our practice.  Pharmacy of record:  Definition: This is the pharmacy where your electronic prescriptions will be sent.  We do not endorse any particular pharmacy. It is up to you and your insurance to decide what pharmacy to use.  We do not restrict you in your choice of pharmacy. However, once we write for your prescriptions, we will NOT be re-sending more prescriptions to fix restricted supply problems created by your pharmacy, or your insurance.  The pharmacy listed in the electronic medical record should be the one where you want electronic prescriptions to be sent. If you choose to change pharmacy, simply notify our nursing staff. Changes will be made only during your regular appointments and not over the  phone.  Recommendations: Keep all of your pain medications in a safe place, under lock and key, even  if you live alone. We will NOT replace lost, stolen, or damaged medication. We do not accept "Police Reports" as proof of medications having been stolen. After you fill your prescription, take 1 week's worth of pills and put them away in a safe place. You should keep a separate, properly labeled bottle for this purpose. The remainder should be kept in the original bottle. Use this as your primary supply, until it runs out. Once it's gone, then you know that you have 1 week's worth of medicine, and it is time to come in for a prescription refill. If you do this correctly, it is unlikely that you will ever run out of medicine. To make sure that the above recommendation works, it is very important that you make sure your medication refill appointments are scheduled at least 1 week before you run out of medicine. To do this in an effective manner, make sure that you do not leave the office without scheduling your next medication management appointment. Always ask the nursing staff to show you in your prescription , when your medication will be running out. Then arrange for the receptionist to get you a return appointment, at least 7 days before you run out of medicine. Do not wait until you have 1 or 2 pills left, to come in. This is very poor planning and does not take into consideration that we may need to cancel appointments due to bad weather, sickness, or emergencies affecting our staff. DO NOT ACCEPT A "Partial Fill": If for any reason your pharmacy does not have enough pills/tablets to completely fill or refill your prescription, do not allow for a "partial fill". The law allows the pharmacy to complete that prescription within 72 hours, without requiring a new prescription. If they do not fill the rest of your prescription within those 72 hours, you will need a separate prescription to fill the remaining  amount, which we will NOT provide. If the reason for the partial fill is your insurance, you will need to talk to the pharmacist about payment alternatives for the remaining tablets, but again, DO NOT ACCEPT A PARTIAL FILL, unless you can trust your pharmacist to obtain the remainder of the pills within 72 hours.  Prescription refills and/or changes in medication(s):  Prescription refills, and/or changes in dose or medication, will be conducted only during scheduled medication management appointments. (Applies to both, written and electronic prescriptions.) No refills on procedure days. No medication will be changed or started on procedure days. No changes, adjustments, and/or refills will be conducted on a procedure day. Doing so will interfere with the diagnostic portion of the procedure. No phone refills. No medications will be "called into the pharmacy". No Fax refills. No weekend refills. No Holliday refills. No after hours refills.  Remember:  Business hours are:  Monday to Thursday 8:00 AM to 4:00 PM Provider's Schedule: [name], MD - Appointments are:  Medication management: Monday and Wednesday 8:00 AM to 4:00 PM Procedure day: Tuesday and Thursday 7:30 AM to 4:00 PM Edward Jolly, MD - Appointments are:  Medication management: Tuesday and Thursday 8:00 AM to 4:00 PM Procedure day: Monday and Wednesday 7:30 AM to 4:00 PM (Last update: 05/30/2022) ______________________________________________________________________   ____________________________________________________________________________________________  Naloxone Nasal Spray  Why am I receiving this medication? [place] STOP ACT requires that all patients taking high dose opioids or at risk of opioids respiratory depression, be prescribed an opioid reversal agent, such as Naloxone (AKA: Narcan).  What is this medication? NALOXONE (  nal OX one) treats opioid overdose, which causes slow or shallow breathing,  severe drowsiness, or trouble staying awake. Call emergency services after using this medication. You may need additional treatment. Naloxone works by reversing the effects of opioids. It belongs to a group of medications called opioid blockers.  COMMON BRAND NAME(S): Kloxxado, Narcan  What should I tell my care team before I take this medication? They need to know if you have any of these conditions: Heart disease Substance use disorder An unusual or allergic reaction to naloxone, other medications, foods, dyes, or preservatives Pregnant or trying to get pregnant Breast-feeding  When to use this medication? This medication is to be used for the treatment of respiratory depression (less than 8 breaths per minute) secondary to opioid overdose.   How to use this medication? This medication is for use in the nose. Lay the person on their back. Support their neck with your hand and allow the head to tilt back before giving the medication. The nasal spray should be given into 1 nostril. After giving the medication, move the person onto their side. Do not remove or test the nasal spray until ready to use. Get emergency medical help right away after giving the first dose of this medication, even if the person wakes up. You should be familiar with how to recognize the signs and symptoms of a narcotic overdose. If more doses are needed, give the additional dose in the other nostril. Talk to your care team about the use of this medication in children. While this medication may be prescribed for children as young as newborns for selected conditions, precautions do apply.  Naloxone Overdosage: If you think you have taken too much of this medicine contact a poison control center or emergency room at once.  NOTE: This medicine is only for you. Do not share this medicine with others.  What if I miss a dose? This does not apply.  What may interact with this medication? This is only used during an  emergency. No interactions are expected during emergency use. This list may not describe all possible interactions. Give your health care provider a list of all the medicines, herbs, non-prescription drugs, or dietary supplements you use. Also tell them if you smoke, drink alcohol, or use illegal drugs. Some items may interact with your medicine.  What should I watch for while using this medication? Keep this medication ready for use in the case of an opioid overdose. Make sure that you have the phone number of your care team and local hospital ready. You may need to have additional doses of this medication. Each nasal spray contains a single dose. Some emergencies may require additional doses. After use, bring the treated person to the nearest hospital or call 911. Make sure the treating care team knows that the person has received a dose of this medication. You will receive additional instructions on what to do during and after use of this medication before an emergency occurs.  What side effects may I notice from receiving this medication? Side effects that you should report to your care team as soon as possible: Allergic reactions--skin rash, itching, hives, swelling of the face, lips, tongue, or throat Side effects that usually do not require medical attention (report these to your care team if they continue or are bothersome): Constipation Dryness or irritation inside the nose Headache Increase in blood pressure Muscle spasms Stuffy nose Toothache This list may not describe all possible side effects. Call your doctor for  medical advice about side effects. You may report side effects to FDA at 1-800-FDA-1088.  Where should I keep my medication? Because this is an emergency medication, you should keep it with you at all times.  Keep out of the reach of children and pets. Store between 20 and 25 degrees C (68 and 77 degrees F). Do not freeze. Throw away any unused medication after the  expiration date. Keep in original box until ready to use.  NOTE: This sheet is a summary. It may not cover all possible information. If you have questions about this medicine, talk to your doctor, pharmacist, or health care provider.   2023 [employer]/Gold Standard (2021-04-01 00:00:00)  ____________________________________________________________________________________________

## 2023-04-29 NOTE — Progress Notes (Unsigned)
PROVIDER NOTE: Information contained herein reflects review and annotations entered in association with encounter. Interpretation of such information and data should be left to medically-trained personnel. Information provided to patient can be located elsewhere in the medical record under "Patient Instructions". Document created using STT-dictation technology, any transcriptional errors that may result from process are unintentional.    Patient: Mark [name]  Service Category: E/M  Provider: Oswaldo Done, MD  DOB: 1955-10-10  DOS: 04/30/2023  Referring Provider: [name], MD  MRN: [id number]  Specialty: Interventional Pain Management  PCP: [name], MD  Type: Established Patient  Setting: Ambulatory outpatient    Location: Office  Delivery: Face-to-face     HPI  [name], a 67 y.o. year old male, is here today because of his Chronic pain syndrome [G89.4]. Mr. Pelkey primary complain today is Shoulder Pain (Left and can radiates to the right ) and Knee Pain (Right )  Pertinent problems: Mr. 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); Cervicogenic headache; Cervical spondylosis with radiculopathy (Bilateral) (L>R); Cervical facet syndrome (Bilateral) (L>R); Cervical foraminal stenosis (Severe C6-7) (Left); Chronic sacroiliac joint pain (Bilateral); Osteoarthritis of sacroiliac joint (Bilateral); Lumbar facet hypertrophy (L1-2, L2-3, and L4-5) (Bilateral); Lumbar IVDD (intervertebral disc displacement); 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); Lumbar facet syndrome (Bilateral); Osteoarthritis of shoulder (Left); Osteoarthritis of knee (Bilateral) (R>L); Chronic cervical radicular pain; Chronic fatigue; RLS (restless legs syndrome); Trigger finger of index finger  (Right); Cervicalgia; Trigger finger of thumb (Right); Chronic thumb pain (Left); Trigger middle finger of right hand; Chronic hand pain (Left); Osteoarthritis of first carpometacarpal joint (thumb) of hand (Left); Trigger point with back pain (Left); Trigger point of shoulder region (scapula) (Left); History of laparoscopic cholecystectomy (10/27/2020); DDD (degenerative disc disease), cervical; Chronic low back pain (Bilateral) w/o sciatica; Chronic shoulder pain (Bilateral) (L>R); Chronic shoulder pain (Left); and Chronic knee pain (Right) on their pertinent problem list. Pain Assessment: Severity of Chronic pain is reported as a 4 /10. Location: Shoulder (right knee pain) Left/shoulder pain from left to the right side and into the neck on the left.. Onset: More than a month ago. Quality: Dull, Aching, Constant, Discomfort, Throbbing. Timing: Constant. Modifying factor(s): pain medicine and moving around for the knee, resting for the shoulder. Vitals:  height is 5' 6.75" (1.695 m) and weight is 217 lb (98.4 kg). His temporal temperature is 97.8 F (36.6 C). His blood pressure is 145/80 (abnormal) and his pulse is 71. His respiration is 16 and oxygen saturation is 99%.  BMI: Estimated body mass index is 34.24 kg/m as calculated from the following:   Height as of this encounter: 5' 6.75" (1.695 m).   Weight as of this encounter: 217 lb (98.4 kg). Last encounter: 01/29/2023. Last procedure: 03/30/2020.  Left levator scapula trigger point injection #1  Reason for encounter: medication management.  The patient indicates doing well with the current medication regimen. No adverse reactions or side effects reported to the medications.  Today the patient indicates having that his right knee pain has still been giving him problems for the past 5 months since it popped while he was walking the dog.  He feels afraid of the knee and a feeling that it is unstable.  He describes the pain as pain behind the patella and  the anterior portion of the knee.  He  describes also having some left shoulder pain, but he refers that the knee pain is much worse than the shoulder pain.  The shoulder pain he describes as an "achy type of pain".  Today we will order some repeat x-rays of the shoulder and the knee since the last time we did any imaging was around 2008.  RTCB: 08/01/2023   Pharmacotherapy Assessment  Analgesic: Oxycodone IR 10 mg, 1 tab PO TID (30 mg/day of oxycodone) MME/day: 45 mg/day.   Monitoring: [place] PMP: PDMP reviewed during this encounter.       Pharmacotherapy: No side-effects or adverse reactions reported. Compliance: No problems identified. Effectiveness: Clinically acceptable.  Vernie Ammons, RN  04/30/2023  2:35 PM  Sign when Signing Visit Nursing Pain Medication Assessment:  Safety precautions to be maintained throughout the outpatient stay will include: orient to surroundings, keep bed in low position, maintain call bell within reach at all times, provide assistance with transfer out of bed and ambulation.  Medication Inspection Compliance: Pill count conducted under aseptic conditions, in front of the patient. Neither the pills nor the bottle was removed from the patient's sight at any time. Once count was completed pills were immediately returned to the patient in their original bottle.  Medication: Oxycodone IR Pill/Patch Count:  31 of 90 pills remain Pill/Patch Appearance: Markings consistent with prescribed medication Bottle Appearance: Standard pharmacy container. Clearly labeled. Filled Date: 08 / 29 / 2024 Last Medication intake:  Today    No results found for: "CBDTHCR" No results found for: "D8THCCBX" No results found for: "D9THCCBX"  UDS:  Summary  Date Value Ref Range Status  01/29/2023 Note  Final    Comment:    ==================================================================== [employer] [facility] 13  (MW) ==================================================================== Test                             Result       Flag       Units  Drug Present   Oxycodone                      1796                    ng/mg creat   Oxymorphone                    153                     ng/mg creat   Noroxycodone                   4143                    ng/mg creat    Sources of oxycodone include scheduled prescription medications.    Oxymorphone and noroxycodone are expected metabolites of oxycodone.    Oxymorphone is also available as a scheduled prescription medication.  ==================================================================== Test                      Result    Flag   Units      Ref Range   Creatinine              68               mg/dL      >=72 ==================================================================== Declared Medications:  Medication list was not provided. ==================================================================== For  clinical consultation, please call [phone]. ====================================================================       ROS  Constitutional: Denies any fever or chills Gastrointestinal: No reported hemesis, hematochezia, vomiting, or acute GI distress Musculoskeletal: Denies any acute onset joint swelling, redness, loss of ROM, or weakness Neurological: No reported episodes of acute onset apraxia, aphasia, dysarthria, agnosia, amnesia, paralysis, loss of coordination, or loss of consciousness  Medication Review  B Complex-C, CertaVite Senior/Antioxidant, Cholecalciferol, Cyanocobalamin, FLUoxetine, Fish Oil, Garlic, Oxycodone HCl, amLODipine, aspirin, atorvastatin, cyclobenzaprine, diphenhydrAMINE, fluticasone, furosemide, metFORMIN, naloxone, naproxen, omeprazole, potassium citrate, rOPINIRole, and vitamin C  History Review  Allergy: Mr. Walmsley is allergic to ciprofloxacin, losartan, metformin, buprenorphine hcl, morphine and  codeine, and penicillins. Drug: Mr. Ostaszewski  reports no history of drug use. Alcohol:  reports no history of alcohol use. Tobacco:  reports that he has never smoked. He has never used smokeless tobacco. Social: Mr. Spradling  reports that he has never smoked. He has never used smokeless tobacco. He reports that he does not drink alcohol and does not use drugs. Medical:  has a past medical history of Anxiety, Arthritis, Chronic pain, Depression, Heart murmur, History of kidney stones (11/17/2015), Hydrocele in adult (05/07/2015), Hypertension, Kidney stones, Migraine, MRSA carrier (2019), Presence of permanent cardiac pacemaker, Prostatitis, and Sleep apnea. Surgical: Mr. Ryce  has a past surgical history that includes Tonsillectomy (1964); Carpal tunnel release (Bilateral, 2006); Ulnar nerve repair (2008); Cervical spine surgery (2008); Rotator cuff repair (Left, 2009); pacemaker placement (2009); Lithotripsy (Bilateral, 2012); Lithotripsy (Left, 11/2015); Cardiovascular stress test (2014); Colonoscopy (07/2007); implantable cardioverter defibrillator (icd) generator change (Left, 12/13/2017); pace maker revision (12/13/2017); Laparoscopic cholecystectomy (10/2020); Cholecystectomy (N/A, 10/27/2020); ERCP (N/A, 10/26/2020); Esophagogastroduodenoscopy (egd) with propofol (N/A, 10/26/2020); EUS (10/26/2020); Sphincterotomy (10/26/2020); and removal of stones (10/26/2020). Family: family history includes Arthritis in his father and mother; Cancer in his father and mother; Diabetes in his mother; Heart block in his father; Heart disease in his father and mother.  Laboratory Chemistry Profile   Renal Lab Results  Component Value Date   BUN 24 (H) 12/18/2022   CREATININE 1.02 12/18/2022   GFR 76.89 12/18/2022   GFRAA >60 12/06/2017   GFRNONAA >60 10/29/2020    Hepatic Lab Results  Component Value Date   AST 31 12/18/2022   ALT 40 12/18/2022   ALBUMIN 3.9 12/18/2022   ALKPHOS 78 12/18/2022   HCVAB NEGATIVE  07/22/2013   LIPASE 39 10/28/2020    Electrolytes Lab Results  Component Value Date   NA 138 12/18/2022   K 3.9 12/18/2022   CL 100 12/18/2022   CALCIUM 9.1 12/18/2022   MG 2.1 09/01/2015    Bone Lab Results  Component Value Date   25OHVITD1 63 02/02/2016   25OHVITD2 <1.0 02/02/2016   25OHVITD3 63 02/02/2016   TESTOSTERONE 495 08/08/2017    Inflammation (CRP: Acute Phase) (ESR: Chronic Phase) Lab Results  Component Value Date   CRP 1.7 (H) 09/01/2015   ESRSEDRATE 6 09/01/2015         Note: Above Lab results reviewed.  Recent Imaging Review  CUP PACEART REMOTE DEVICE CHECK Scheduled remote reviewed. Normal device function.   There was one NSVT arrhythmia detected Next remote 91 days.  Hassell Halim, RN, CCDS, [employer] Note: Reviewed        Physical Exam  General appearance: Well nourished, well developed, and well hydrated. In no apparent acute distress Mental status: Alert, oriented x 3 (person, place, & time)       Respiratory: No evidence  of acute respiratory distress Eyes: PERLA Vitals: BP (!) 145/80 (BP Location: Right Arm, Patient Position: Sitting, Cuff Size: Normal)   Pulse 71   Temp 97.8 F (36.6 C) (Temporal)   Resp 16   Ht 5' 6.75" (1.695 m)   Wt 217 lb (98.4 kg)   SpO2 99%   BMI 34.24 kg/m  BMI: Estimated body mass index is 34.24 kg/m as calculated from the following:   Height as of this encounter: 5' 6.75" (1.695 m).   Weight as of this encounter: 217 lb (98.4 kg). Ideal: Ideal body weight: 65.5 kg (144 lb 7.3 oz) Adjusted ideal body weight: 78.7 kg (173 lb 7.6 oz)  Assessment   Diagnosis Status  1. Chronic pain syndrome   2. Cervicalgia   3. Chronic neck pain (1ry area of Pain) (Bilateral) (L>R)   4. Chronic sacroiliac joint pain (Bilateral)   5. Cervicogenic headache   6. 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 Lumbar facet syndrome (Bilateral)   8. Chronic shoulder pain (Bilateral) (L>R)   9.  Chronic cervical radicular pain   10. Chronic low back pain (Bilateral) w/o sciatica   11. Pharmacologic therapy   12. Chronic use of opiate for therapeutic purpose   13. Encounter for medication management   14. Encounter for chronic pain management   15. Chronic knee pain (Bilateral) (R>L)   16. Osteoarthritis of knee (Bilateral) (R>L)   17. Chronic knee pain (Right)    Controlled Controlled Controlled   Updated Problems: Problem  Chronic knee pain (Right)    Plan of Care  Problem-specific:  No problem-specific Assessment & Plan notes found for this encounter.  Mr. ALAM BOTA has a current medication list which includes the following long-term medication(s): amlodipine, atorvastatin, diphenhydramine, fluoxetine, fluoxetine, fluticasone, furosemide, metformin, omeprazole, ropinirole, [START ON 05/03/2023] oxycodone hcl, [START ON 06/02/2023] oxycodone hcl, and [START ON 07/02/2023] oxycodone hcl.  Pharmacotherapy (Medications Ordered): Meds ordered this encounter  Medications   Oxycodone HCl 10 MG TABS    Sig: Take 1 tablet (10 mg total) by mouth every 8 (eight) hours as needed. Each refill must last 30 days.    Dispense:  90 tablet    Refill:  0    DO NOT: delete (not duplicate); no partial-fill (will deny script to complete), no refill request (F/U required). DISPENSE: 1 day early if closed on fill date. WARN: No CNS-depressants within 8 hrs of med.   Oxycodone HCl 10 MG TABS    Sig: Take 1 tablet (10 mg total) by mouth every 8 (eight) hours as needed. Each refill must last 30 days.    Dispense:  90 tablet    Refill:  0    DO NOT: delete (not duplicate); no partial-fill (will deny script to complete), no refill request (F/U required). DISPENSE: 1 day early if closed on fill date. WARN: No CNS-depressants within 8 hrs of med.   Oxycodone HCl 10 MG TABS    Sig: Take 1 tablet (10 mg total) by mouth every 8 (eight) hours as needed. Each refill must last 30 days.    Dispense:   90 tablet    Refill:  0    DO NOT: delete (not duplicate); no partial-fill (will deny script to complete), no refill request (F/U required). DISPENSE: 1 day early if closed on fill date. WARN: No CNS-depressants within 8 hrs of med.   pentafluoroprop-tetrafluoroeth (GEBAUERS) aerosol   methylPREDNISolone acetate (DEPO-MEDROL) injection 80 mg   lidocaine HCl (  PF) (XYLOCAINE) 2 % injection 2 mL   ropivacaine (PF) 2 mg/mL (0.2%) (NAROPIN) injection 2 mL   Orders:  Orders Placed This Encounter  Procedures   KNEE INJECTION    Local Anesthetic & Steroid injection.    Scheduling Instructions:     Side(s): Right Knee     Sedation: None     Timeframe: Today    Order Specific Question:   Where will this procedure be performed?    Answer:   [facility]   DG Knee Complete 4 Views Right    Standing Status:   Future    Standing Expiration Date:   07/30/2023    Scheduling Instructions:     Please make sure that the patient understands that this needs to be done as soon as possible. Never have the patient do the imaging "just before the next appointment". Inform patient that having the imaging done within the [facility] will expedite the availability of the results and will provide      imaging availability to the requesting physician. In addition inform the patient that the imaging order has an expiration date and will not be renewed if not done within the active period.    Order Specific Question:   Reason for Exam (SYMPTOM  OR DIAGNOSIS REQUIRED)    Answer:   Right knee pain/arthralgia    Order Specific Question:   Preferred imaging location?    Answer:   [facility]    Order Specific Question:   Release to patient    Answer:   Immediate    Order Specific Question:   Call Results- Best Contact Number?    Answer:   [phone] [facility]   DG Shoulder Left    Please make sure that the patient understands that this needs to be  done as soon as possible. Never have the patient do the imaging "just before the next appointment". Inform patient that having the imaging done within the [facility] will expedite the availability of the results and will provide imaging availability to the requesting physician. In addition inform the patient that the imaging order has an expiration date and will not be renewed if not done within the active period.    Standing Status:   Future    Standing Expiration Date:   07/30/2023    Scheduling Instructions:     Imaging must be done as soon as possible. Inform patient that order will expire within 30 days and I will not renew it.    Order Specific Question:   Reason for Exam (SYMPTOM  OR DIAGNOSIS REQUIRED)    Answer:   Left shoulder pain    Order Specific Question:   Preferred imaging location?    Answer:   [facility]    Order Specific Question:   Call Results- Best Contact Number?    Answer:   [phone] [facility]    Order Specific Question:   Release to patient    Answer:   Immediate   Provide equipment / supplies at bedside    Procedure tray: "Block Tray" (Disposable  single use) Skin infiltration needle: Regular 1.5-in, 25-G, (x1) Block Needle type: Regular Amount/quantity: 1 Size: Short(1.5-inch) Gauge: (25G x1) + (22G x1)    Standing Status:   Standing    Number of Occurrences:   1    Order Specific Question:   Specify    Answer:  Block Tray   Follow-up plan:   Return in about 2 weeks (around 05/14/2023) for (Face2F), (PPE).      Interventional Therapies  Risk  Complexity Considerations:   WNL   Planned  Pending:   Therapeutic/palliative right IA steroid knee inj. #1  Diagnostic left IA shoulder joint injection #1 vs left suprascapular nerve block #1    Under consideration:      Completed:   Therapeutic left levator scapula, supraspinatus, and rhomboid muscle TPI/MNB x1 (03/30/2020) (100/100/85/75)   Diagnostic left C7-T1 cervical ESI x1 (06/21/2016) (90/90/70/75)  Diagnostic bilateral IA Hyalgan knee inj. x3 (06/21/2016) (left: 100/90/100/90/75) (right: 90/90/80/75)  Diagnostic/therapeutic right thumb inj. x1 (05/23/2016) (n/a) Diagnostic/therapeutic left thumb (CMC-Trapezium) inj. x1 (03/11/2020) (100/100/75/75)    Therapeutic  Palliative (PRN) options:   Therapeutic IA Hyalgan knee injections       Recent Visits No visits were found meeting these conditions. Showing recent visits within past 90 days and meeting all other requirements Today's Visits Date Type Provider Dept  04/30/23 Office Visit [name], MD [facility]  Showing today's visits and meeting all other requirements Future Appointments No visits were found meeting these conditions. Showing future appointments within next 90 days and meeting all other requirements  I discussed the assessment and treatment plan with the patient. The patient was provided an opportunity to ask questions and all were answered. The patient agreed with the plan and demonstrated an understanding of the instructions.  Patient advised to call back or seek an in-person evaluation if the symptoms or condition worsens.  Duration of encounter: 30 minutes.  Total time on encounter, as per AMA guidelines included both the face-to-face and non-face-to-face time personally spent by the physician and/or other qualified health care professional(s) on the day of the encounter (includes time in activities that require the physician or other qualified health care professional and does not include time in activities normally performed by clinical staff). Physician's time may include the following activities when performed: Preparing to see the patient (e.g., pre-charting review of records, searching for previously ordered imaging, lab work, and nerve conduction tests) Review of prior analgesic pharmacotherapies. Reviewing PMP Interpreting  ordered tests (e.g., lab work, imaging, nerve conduction tests) Performing post-procedure evaluations, including interpretation of diagnostic procedures Obtaining and/or reviewing separately obtained history Performing a medically appropriate examination and/or evaluation Counseling and educating the patient/family/caregiver Ordering medications, tests, or procedures Referring and communicating with other health care professionals (when not separately reported) Documenting clinical information in the electronic or other health record Independently interpreting results (not separately reported) and communicating results to the patient/ family/caregiver Care coordination (not separately reported)  Note by: Oswaldo Done, MD Date: 04/30/2023; Time: 3:04 PM

## 2023-04-30 ENCOUNTER — Ambulatory Visit: Payer: Medicare Other | Admitting: Family Medicine

## 2023-04-30 ENCOUNTER — Encounter: Payer: Self-pay | Admitting: Pain Medicine

## 2023-04-30 ENCOUNTER — Ambulatory Visit: Payer: Medicare Other | Attending: Pain Medicine | Admitting: Pain Medicine

## 2023-04-30 VITALS — BP 145/80 | HR 71 | Temp 97.8°F | Resp 16 | Ht 66.75 in | Wt 217.0 lb

## 2023-04-30 DIAGNOSIS — M25561 Pain in right knee: Secondary | ICD-10-CM | POA: Insufficient documentation

## 2023-04-30 DIAGNOSIS — Z79891 Long term (current) use of opiate analgesic: Secondary | ICD-10-CM

## 2023-04-30 DIAGNOSIS — G894 Chronic pain syndrome: Secondary | ICD-10-CM

## 2023-04-30 DIAGNOSIS — M25512 Pain in left shoulder: Secondary | ICD-10-CM | POA: Diagnosis not present

## 2023-04-30 DIAGNOSIS — M47816 Spondylosis without myelopathy or radiculopathy, lumbar region: Secondary | ICD-10-CM

## 2023-04-30 DIAGNOSIS — M5412 Radiculopathy, cervical region: Secondary | ICD-10-CM | POA: Diagnosis not present

## 2023-04-30 DIAGNOSIS — M25562 Pain in left knee: Secondary | ICD-10-CM | POA: Diagnosis not present

## 2023-04-30 DIAGNOSIS — M961 Postlaminectomy syndrome, not elsewhere classified: Secondary | ICD-10-CM | POA: Diagnosis not present

## 2023-04-30 DIAGNOSIS — M542 Cervicalgia: Secondary | ICD-10-CM

## 2023-04-30 DIAGNOSIS — G4486 Cervicogenic headache: Secondary | ICD-10-CM | POA: Diagnosis not present

## 2023-04-30 DIAGNOSIS — Z79899 Other long term (current) drug therapy: Secondary | ICD-10-CM | POA: Diagnosis not present

## 2023-04-30 DIAGNOSIS — M25511 Pain in right shoulder: Secondary | ICD-10-CM | POA: Diagnosis not present

## 2023-04-30 DIAGNOSIS — G8929 Other chronic pain: Secondary | ICD-10-CM

## 2023-04-30 DIAGNOSIS — M545 Low back pain, unspecified: Secondary | ICD-10-CM

## 2023-04-30 DIAGNOSIS — M170 Bilateral primary osteoarthritis of knee: Secondary | ICD-10-CM

## 2023-04-30 DIAGNOSIS — M533 Sacrococcygeal disorders, not elsewhere classified: Secondary | ICD-10-CM | POA: Insufficient documentation

## 2023-04-30 MED ORDER — OXYCODONE HCL 10 MG PO TABS
10.0000 mg | ORAL_TABLET | Freq: Three times a day (TID) | ORAL | 0 refills | Status: DC | PRN
Start: 2023-05-03 — End: 2023-07-24

## 2023-04-30 MED ORDER — METHYLPREDNISOLONE ACETATE 80 MG/ML IJ SUSP
INTRAMUSCULAR | Status: AC
  Filled 2023-04-30: qty 1

## 2023-04-30 MED ORDER — LIDOCAINE HCL (PF) 2 % IJ SOLN
2.0000 mL | Freq: Once | INTRAMUSCULAR | Status: AC
  Administered 2023-04-30: 2 mL

## 2023-04-30 MED ORDER — OXYCODONE HCL 10 MG PO TABS
10.0000 mg | ORAL_TABLET | Freq: Three times a day (TID) | ORAL | 0 refills | Status: DC | PRN
Start: 2023-07-02 — End: 2023-07-24

## 2023-04-30 MED ORDER — METHYLPREDNISOLONE ACETATE 80 MG/ML IJ SUSP
80.0000 mg | Freq: Once | INTRAMUSCULAR | Status: AC
  Administered 2023-04-30: 80 mg via INTRA_ARTICULAR

## 2023-04-30 MED ORDER — OXYCODONE HCL 10 MG PO TABS
10.0000 mg | ORAL_TABLET | Freq: Three times a day (TID) | ORAL | 0 refills | Status: DC | PRN
Start: 2023-06-02 — End: 2023-07-24

## 2023-04-30 MED ORDER — PENTAFLUOROPROP-TETRAFLUOROETH EX AERO
INHALATION_SPRAY | Freq: Once | CUTANEOUS | Status: AC
  Administered 2023-04-30: 30 via TOPICAL
  Filled 2023-04-30: qty 30

## 2023-04-30 MED ORDER — ROPIVACAINE HCL 2 MG/ML IJ SOLN
2.0000 mL | Freq: Once | INTRAMUSCULAR | Status: AC
  Administered 2023-04-30: 2 mL via INTRA_ARTICULAR

## 2023-04-30 MED ORDER — LIDOCAINE HCL (PF) 1 % IJ SOLN
INTRAMUSCULAR | Status: AC
  Filled 2023-04-30: qty 5

## 2023-04-30 MED ORDER — ROPIVACAINE HCL 2 MG/ML IJ SOLN
INTRAMUSCULAR | Status: AC
  Filled 2023-04-30: qty 20

## 2023-04-30 NOTE — Progress Notes (Signed)
Nursing Pain Medication Assessment:  Safety precautions to be maintained throughout the outpatient stay will include: orient to surroundings, keep bed in low position, maintain call bell within reach at all times, provide assistance with transfer out of bed and ambulation.  Medication Inspection Compliance: Pill count conducted under aseptic conditions, in front of the patient. Neither the pills nor the bottle was removed from the patient's sight at any time. Once count was completed pills were immediately returned to the patient in their original bottle.  Medication: Oxycodone IR Pill/Patch Count:  31 of 90 pills remain Pill/Patch Appearance: Markings consistent with prescribed medication Bottle Appearance: Standard pharmacy container. Clearly labeled. Filled Date: 08 / 29 / 2024 Last Medication intake:  Today

## 2023-04-30 NOTE — Progress Notes (Signed)
PROVIDER NOTE: Interpretation of information contained herein should be left to medically-trained personnel. Specific patient instructions are provided elsewhere under "Patient Instructions" section of medical record. This document was created in part using STT-dictation technology, any transcriptional errors that may result from this process are unintentional.  Patient: Mark [name] Type: Established DOB: May 01, 1956 MRN: [id number] PCP: [name], MD  Service: Procedure DOS: 04/30/2023 Setting: Ambulatory Location: Ambulatory outpatient facility Delivery: Face-to-face Provider: Oswaldo Done, MD Specialty: Interventional Pain Management Specialty designation: 09 Location: Outpatient facility Ref. Prov.: [name], MD       Interventional Therapy   Type:  Steroid Intra-articular Knee Injection #1  Laterality: Right (-RT) Level/approach: Lateral Imaging guidance: None required ([id number]) Anesthesia: Local anesthesia (1-2% Lidocaine) Anxiolysis: None                 Sedation: No Sedation                       DOS: 04/30/2023  Performed by: Oswaldo Done, MD  Purpose: Diagnostic/Therapeutic Indications: Knee arthralgia associated to osteoarthritis of the knee 1. Chronic knee pain (Right)   2. Osteoarthritis of knee (Bilateral) (R>L)   3. Chronic knee pain (Bilateral) (R>L)    NAS-11 score:   Pre-procedure: 4 /10   Post-procedure: 4 /10     Pre-Procedure Preparation  Monitoring: As per clinic protocol.  Risk Assessment: Vitals:  [id number] body mass index is 34.24 kg/m as calculated from the following:   Height as of this encounter: 5' 6.75" (1.695 m).   Weight as of this encounter: 217 lb (98.4 kg)., Rate:71 , BP:(!) 145/80, Resp:16, Temp:97.8 F (36.6 C), SpO2:99 %  Allergies: He is allergic to ciprofloxacin, losartan, metformin, buprenorphine hcl, morphine and codeine, and penicillins.  Precautions: No additional precautions required   Blood-thinner(s): None at this time  Coagulopathies: Reviewed. None identified.   Active Infection(s): Reviewed. None identified. Mr. Spiegel is afebrile   Location setting: Exam room Position: Sitting w/ knee bent 90 degrees Safety Precautions: Patient was assessed for positional comfort and pressure points before starting the procedure. Prepping solution: DuraPrep (Iodine Povacrylex [0.7% available iodine] and Isopropyl Alcohol, 74% w/w) Prep Area: Entire knee region Approach: percutaneous, just above the tibial plateau, lateral to the infrapatellar tendon. Intended target: Intra-articular knee space Materials: Tray: Block Needle(s): Regular Qty: 1/side Length: 1.5-inch Gauge: 25G (x1) + 22G (x1)  Meds ordered this encounter  Medications   Oxycodone HCl 10 MG TABS    Sig: Take 1 tablet (10 mg total) by mouth every 8 (eight) hours as needed. Each refill must last 30 days.    Dispense:  90 tablet    Refill:  0    DO NOT: delete (not duplicate); no partial-fill (will deny script to complete), no refill request (F/U required). DISPENSE: 1 day early if closed on fill date. WARN: No CNS-depressants within 8 hrs of med.   Oxycodone HCl 10 MG TABS    Sig: Take 1 tablet (10 mg total) by mouth every 8 (eight) hours as needed. Each refill must last 30 days.    Dispense:  90 tablet    Refill:  0    DO NOT: delete (not duplicate); no partial-fill (will deny script to complete), no refill request (F/U required). DISPENSE: 1 day early if closed on fill date. WARN: No CNS-depressants within 8 hrs of med.   Oxycodone HCl 10 MG TABS    Sig: Take 1 tablet (10 mg total) by mouth  every 8 (eight) hours as needed. Each refill must last 30 days.    Dispense:  90 tablet    Refill:  0    DO NOT: delete (not duplicate); no partial-fill (will deny script to complete), no refill request (F/U required). DISPENSE: 1 day early if closed on fill date. WARN: No CNS-depressants within 8 hrs of med.    pentafluoroprop-tetrafluoroeth (GEBAUERS) aerosol   methylPREDNISolone acetate (DEPO-MEDROL) injection 80 mg   lidocaine HCl (PF) (XYLOCAINE) 2 % injection 2 mL   ropivacaine (PF) 2 mg/mL (0.2%) (NAROPIN) injection 2 mL    Orders Placed This Encounter  Procedures   KNEE INJECTION    Local Anesthetic & Steroid injection.    Scheduling Instructions:     Side(s): Right Knee     Sedation: None     Timeframe: Today    Order Specific Question:   Where will this procedure be performed?    Answer:   [facility]   DG Knee Complete 4 Views Right    Standing Status:   Future    Standing Expiration Date:   07/30/2023    Scheduling Instructions:     Please make sure that the patient understands that this needs to be done as soon as possible. Never have the patient do the imaging "just before the next appointment". Inform patient that having the imaging done within the [facility] will expedite the availability of the results and will provide      imaging availability to the requesting physician. In addition inform the patient that the imaging order has an expiration date and will not be renewed if not done within the active period.    Order Specific Question:   Reason for Exam (SYMPTOM  OR DIAGNOSIS REQUIRED)    Answer:   Right knee pain/arthralgia    Order Specific Question:   Preferred imaging location?    Answer:   [facility]    Order Specific Question:   Release to patient    Answer:   Immediate    Order Specific Question:   Call Results- Best Contact Number?    Answer:   [phone] [facility]   DG Shoulder Left    Please make sure that the patient understands that this needs to be done as soon as possible. Never have the patient do the imaging "just before the next appointment". Inform patient that having the imaging done within the [facility] will expedite the availability of the results and will provide imaging  availability to the requesting physician. In addition inform the patient that the imaging order has an expiration date and will not be renewed if not done within the active period.    Standing Status:   Future    Standing Expiration Date:   07/30/2023    Scheduling Instructions:     Imaging must be done as soon as possible. Inform patient that order will expire within 30 days and I will not renew it.    Order Specific Question:   Reason for Exam (SYMPTOM  OR DIAGNOSIS REQUIRED)    Answer:   Left shoulder pain    Order Specific Question:   Preferred imaging location?    Answer:   [facility]    Order Specific Question:   Call Results- Best Contact Number?    Answer:   [phone] [facility]    Order Specific Question:   Release to patient  Answer:   Immediate   Informed Consent Details: Physician/Practitioner Attestation; Transcribe to consent form and obtain patient signature    Note: Always confirm laterality of pain with Mr. Leonguerrero, before procedure. Transcribe to consent form and obtain patient signature.    Order Specific Question:   Physician/Practitioner attestation of informed consent for procedure/surgical case    Answer:   I, the physician/practitioner, attest that I have discussed with the patient the benefits, risks, side effects, alternatives, likelihood of achieving goals and potential problems during recovery for the procedure that I have provided informed consent.    Order Specific Question:   Procedure    Answer:   Right-sided intra-articular knee arthrocentesis (aspiration and/or injection)    Order Specific Question:   Physician/Practitioner performing the procedure    Answer:   Kellis Topete A. Laban Emperor, MD    Order Specific Question:   Indication/Reason    Answer:   Chronic right-sided knee pain secondary to knee arthropathy/arthralgia   Provide equipment / supplies at bedside    Procedure tray: "Block Tray"  (Disposable  single use) Skin infiltration needle: Regular 1.5-in, 25-G, (x1) Block Needle type: Regular Amount/quantity: 1 Size: Short(1.5-inch) Gauge: (25G x1) + (22G x1)    Standing Status:   Standing    Number of Occurrences:   1    Order Specific Question:   Specify    Answer:   Block Tray     Time-out: 1508 I initiated and conducted the "Time-out" before starting the procedure, as per protocol. The patient was asked to participate by confirming the accuracy of the "Time Out" information. Verification of the correct person, site, and procedure were performed and confirmed by me, the nursing staff, and the patient. "Time-out" conducted as per Joint Commission's Universal Protocol (UP.01.01.01). Procedure checklist: Completed  H&P (Pre-op  Assessment)  Mr. Krizman is a 67 y.o. (year old), male patient, seen today for interventional treatment. He  has a past surgical history that includes Tonsillectomy (1964); Carpal tunnel release (Bilateral, 2006); Ulnar nerve repair (2008); Cervical spine surgery (2008); Rotator cuff repair (Left, 2009); pacemaker placement (2009); Lithotripsy (Bilateral, 2012); Lithotripsy (Left, 11/2015); Cardiovascular stress test (2014); Colonoscopy (07/2007); implantable cardioverter defibrillator (icd) generator change (Left, 12/13/2017); pace maker revision (12/13/2017); Laparoscopic cholecystectomy (10/2020); Cholecystectomy (N/A, 10/27/2020); ERCP (N/A, 10/26/2020); Esophagogastroduodenoscopy (egd) with propofol (N/A, 10/26/2020); EUS (10/26/2020); Sphincterotomy (10/26/2020); and removal of stones (10/26/2020). Mr. Matczak has a current medication list which includes the following prescription(s): amlodipine, vitamin c, aspirin, atorvastatin, b complex-c, cholecalciferol, cyanocobalamin, cyclobenzaprine, diphenhydramine, fluoxetine, fluoxetine, fluticasone, furosemide, garlic, metformin, certavite senior/antioxidant, naloxone, naproxen, fish oil, omeprazole, potassium citrate,  ropinirole, [START ON 05/03/2023] oxycodone hcl, [START ON 06/02/2023] oxycodone hcl, and [START ON 07/02/2023] oxycodone hcl. His primarily concern today is the Shoulder Pain (Left and can radiates to the right ) and Knee Pain (Right )  He is allergic to ciprofloxacin, losartan, metformin, buprenorphine hcl, morphine and codeine, and penicillins.   Last encounter: My last encounter with him was on 01/29/2023. Pertinent problems: Mr. 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); Chronic sacroiliac joint pain (Bilateral); Osteoarthritis of sacroiliac joint (Bilateral); Lumbar facet hypertrophy (L1-2, L2-3, and L4-5) (Bilateral); Lumbar IVDD (intervertebral disc displacement); 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); Lumbar facet syndrome (Bilateral);  Osteoarthritis of shoulder (Left); Osteoarthritis of knee (Bilateral) (R>L); Chronic cervical radicular pain; Chronic fatigue; RLS (restless legs syndrome); Trigger finger of index finger (Right); Cervicalgia; Trigger finger of thumb (Right); Chronic thumb pain (Left); Trigger middle finger of right hand; Chronic hand pain (Left); Osteoarthritis of first carpometacarpal joint (thumb) of hand (Left); Trigger point with back pain (Left); Trigger point of shoulder region (scapula) (Left); History of laparoscopic cholecystectomy (10/27/2020); DDD (degenerative disc disease), cervical; Chronic low back pain (Bilateral) w/o sciatica; Chronic shoulder pain (Bilateral) (L>R); Chronic shoulder pain (Left); and Chronic knee pain (Right) on their pertinent problem list. Pain Assessment: Severity of  Chronic pain is reported as a 4 /10. Location: Shoulder (right knee pain) Left/shoulder pain from left to the right side and into the neck on the left.. Onset: More than a month ago. Quality: Dull, Aching, Constant, Discomfort, Throbbing. Timing: Constant. Modifying factor(s): pain medicine and moving around for the knee, resting for the shoulder. Vitals:  height is 5' 6.75" (1.695 m) and weight is 217 lb (98.4 kg). His temporal temperature is 97.8 F (36.6 C). His blood pressure is 145/80 (abnormal) and his pulse is 71. His respiration is 16 and oxygen saturation is 99%.   Reason for encounter: "interventional pain management therapy due pain of at least four (4) weeks in duration, with failure to respond and/or inability to tolerate more conservative care.  Site Confirmation: Mr. Nabor was asked to confirm the procedure and laterality before marking the site.  Consent: Before the procedure and under the influence of no sedative(s), amnesic(s), or anxiolytics, the patient was informed of the treatment options, risks and possible complications. To fulfill our ethical and legal obligations, as recommended by the American Medical Association's Code of Ethics, I have informed the patient of my clinical impression; the nature and purpose of the treatment or procedure; the risks, benefits, and possible complications of the intervention; the alternatives, including doing nothing; the risk(s) and benefit(s) of the alternative treatment(s) or procedure(s); and the risk(s) and benefit(s) of doing nothing. The patient was provided information about the general risks and possible complications associated with the procedure. These may include, but are not limited to: failure to achieve desired goals, infection, bleeding, organ or nerve damage, allergic reactions, paralysis, and death. In addition, the patient was informed of those risks and complications associated to Spine-related procedures, such as failure to  decrease pain; infection (i.e.: Meningitis, epidural or intraspinal abscess); bleeding (i.e.: epidural hematoma, subarachnoid hemorrhage, or any other type of intraspinal or peri-dural bleeding); organ or nerve damage (i.e.: Any type of peripheral nerve, nerve root, or spinal cord injury) with subsequent damage to sensory, motor, and/or autonomic systems, resulting in permanent pain, numbness, and/or weakness of one or several areas of the body; allergic reactions; (i.e.: anaphylactic reaction); and/or death. Furthermore, the patient was informed of those risks and complications associated with the medications. These include, but are not limited to: allergic reactions (i.e.: anaphylactic or anaphylactoid reaction(s)); adrenal axis suppression; blood sugar elevation that in diabetics may result in ketoacidosis or comma; water retention that in patients with history of congestive heart failure may result in shortness of breath, pulmonary edema, and decompensation with resultant heart failure; weight gain; swelling or edema; medication-induced neural toxicity; particulate matter embolism and blood vessel occlusion with resultant organ, and/or nervous system infarction; and/or aseptic necrosis of one or more joints. Finally, the patient was informed that Medicine is not an exact science; therefore, there is also the possibility of unforeseen or unpredictable risks  and/or possible complications that may result in a catastrophic outcome. The patient indicated having understood very clearly. We have given the patient no guarantees and we have made no promises. Enough time was given to the patient to ask questions, all of which were answered to the patient's satisfaction. Mr. Gayhart has indicated that he wanted to continue with the procedure. Attestation: I, the ordering provider, attest that I have discussed with the patient the benefits, risks, side-effects, alternatives, likelihood of achieving goals, and potential  problems during recovery for the procedure that I have provided informed consent.  Date  Time: 04/30/2023  2:35 PM  Description of procedure  Start Time: 1508 hrs  Local Anesthesia: Once the patient was positioned, prepped, and time-out was completed. The target area was identified located. The skin was marked with an approved surgical skin marker. Once marked, the skin (epidermis, dermis, and hypodermis), and deeper tissues (fat, connective tissue and muscle) were infiltrated with a small amount of a short-acting local anesthetic, loaded on a 10cc syringe with a 25G, 1.5-in  Needle. An appropriate amount of time was allowed for local anesthetics to take effect before proceeding to the next step. Local Anesthetic: Lidocaine 1-2% The unused portion of the local anesthetic was discarded in the proper designated containers. Safety Precautions: Aspiration looking for blood return was conducted prior to all injections. At no point did I inject any substances, as a needle was being advanced. Before injecting, the patient was told to immediately notify me if he was experiencing any new onset of "ringing in the ears, or metallic taste in the mouth". No attempts were made at seeking any paresthesias. Safe injection practices and needle disposal techniques used. Medications properly checked for expiration dates. SDV (single dose vial) medications used. After the completion of the procedure, all disposable equipment used was discarded in the proper designated medical waste containers.  Technical description: Protocol guidelines were followed. After positioning, the target area was identified and prepped in the usual manner. Skin & deeper tissues infiltrated with local anesthetic. Appropriate amount of time allowed to pass for local anesthetics to take effect. Proper needle placement secured. Once satisfactory needle placement was confirmed, I proceeded to inject the desired solution in slow, incremental fashion,  intermittently assessing for discomfort or any signs of abnormal or undesired spread of substance. Once completed, the needle was removed and disposed of, as per hospital protocols. The area was cleaned, making sure to leave some of the prepping solution back to take advantage of its long term bactericidal properties.  Aspiration:  Negative        Vitals:   04/30/23 1433  BP: (!) 145/80  Pulse: 71  Resp: 16  Temp: 97.8 F (36.6 C)  TempSrc: Temporal  SpO2: 99%  Weight: 217 lb (98.4 kg)  Height: 5' 6.75" (1.695 m)    End Time: 1509 hrs  Imaging guidance  Imaging-assisted Technique: None required. Indication(s): N/A Exposure Time: N/A Contrast: None Fluoroscopic Guidance: N/A Ultrasound Guidance: N/A Interpretation: N/A  Post-op assessment  Post-procedure Vital Signs:  Pulse/HCG Rate: 71  Temp: 97.8 F (36.6 C) Resp: 16 BP: (!) 145/80 SpO2: 99 %  EBL: None  Complications: No immediate post-treatment complications observed by team, or reported by patient.  Note: The patient tolerated the entire procedure well. A repeat set of vitals were taken after the procedure and the patient was kept under observation following institutional policy, for this type of procedure. Post-procedural neurological assessment was performed, showing return to baseline, prior to  discharge. The patient was provided with post-procedure discharge instructions, including a section on how to identify potential problems. Should any problems arise concerning this procedure, the patient was given instructions to immediately contact us, at any time, without hesitation. In any case, we plan to contact the patient by telephone for a follow-up status report regarding this interventional procedure.  Comments:  No additional relevant information.  Plan of care  Chronic Opioid Analgesic:  Oxycodone IR 10 mg, 1 tab PO TID (30 mg/day of oxycodone) MME/day: 45 mg/day.   Medications administered: We  administered pentafluoroprop-tetrafluoroeth, methylPREDNISolone acetate, lidocaine HCl (PF), and ropivacaine (PF) 2 mg/mL (0.2%).  Follow-up plan:   Return in about 2 weeks (around 05/14/2023) for (Face2F), (PPE).      Interventional Therapies  Risk  Complexity Considerations:   WNL   Planned  Pending:   Diagnostic left IA shoulder joint injection #1 vs left suprascapular nerve block #1    Under consideration:   Diagnostic right hand digit 2 trigger finger injection Therapeutic bilateral IA Hyalgan knee injections  Diagnostic bilateral cervical facet block  Possible bilateral cervical facet RFA  Diagnostic left sided CESI  Diagnostic bilateral IA knee injection  Diagnostic bilateral genicular NB  Possible bilateral genicular nerve RFA  Diagnostic bilateral sacroiliac joint block  Possible bilateral sacroiliac joint RFA  Diagnostic bilateral IA shoulder joint injection  Diagnostic bilateral suprascapular NB  Possible bilateral suprascapular nerve RFA  Diagnostic bilateral lumbar facet block  Possible bilateral lumbar facet RFA  Diagnostic left L4-5 LESI    Completed:   Therapeutic left levator scapula, supraspinatus, and rhomboid muscle TPI/MNB x1 (03/30/2020) (100/100/85/75)  Diagnostic left C7-T1 cervical ESI x1 (06/21/2016) (90/90/70/75)  Diagnostic bilateral IA Hyalgan knee inj. x3 (06/21/2016) (left: 100/90/100/90/75) (right: 90/90/80/75)  Diagnostic/therapeutic right thumb inj. x1 (05/23/2016) (n/a) Diagnostic/therapeutic left thumb (CMC-Trapezium) inj. x1 (03/11/2020) (100/100/75/75)    Therapeutic  Palliative (PRN) options:   Therapeutic IA Hyalgan knee injections       Recent Visits No visits were found meeting these conditions. Showing recent visits within past 90 days and meeting all other requirements Today's Visits Date Type Provider Dept  04/30/23 Office Visit [name], MD [facility]  Showing today's visits and meeting all other  requirements Future Appointments No visits were found meeting these conditions. Showing future appointments within next 90 days and meeting all other requirements   Disposition: Discharge home  Discharge (Date  Time): 04/30/2023; 1509 hrs.   Primary Care Physician: [name], MD Location: [facility] Note by: Oswaldo Done, MD Date: 04/30/2023; Time: 3:13 PM  DISCLAIMER: Medicine is not an Visual merchandiser. It has no guarantees or warranties. The decision to proceed with this intervention was based on the information collected from the patient. Conclusions were drawn from the patient's questionnaire, interview, and examination. Because information was provided in large part by the patient, it cannot be guaranteed that it has not been purposely or unconsciously manipulated or altered. Every effort has been made to obtain as much accurate, relevant, available data as possible. Always take into account that the treatment will also be dependent on availability of resources and existing treatment guidelines, considered by other Pain Management Specialists as being common knowledge and practice, at the time of the intervention. It is also important to point out that variation in procedural techniques and pharmacological choices are the acceptable norm. For Medico-Legal review purposes, the indications, contraindications, technique, and results of the these procedures should only be evaluated, judged and  interpreted by a Board-Certified Interventional Pain Specialist with extensive familiarity and expertise in the same exact procedure and technique.

## 2023-05-01 ENCOUNTER — Ambulatory Visit (HOSPITAL_COMMUNITY)
Admission: RE | Admit: 2023-05-01 | Discharge: 2023-05-01 | Disposition: A | Discharge status: Home / Self Care | Payer: Medicare Other | Source: Ambulatory Visit | Attending: Pain Medicine | Admitting: Pain Medicine

## 2023-05-01 ENCOUNTER — Other Ambulatory Visit: Payer: Self-pay

## 2023-05-01 ENCOUNTER — Encounter: Payer: Self-pay | Admitting: Family Medicine

## 2023-05-01 ENCOUNTER — Ambulatory Visit: Payer: Medicare Other | Admitting: Family Medicine

## 2023-05-01 VITALS — BP 130/68 | HR 80 | Temp 97.4°F | Ht 66.75 in | Wt 211.0 lb

## 2023-05-01 DIAGNOSIS — M170 Bilateral primary osteoarthritis of knee: Secondary | ICD-10-CM | POA: Diagnosis not present

## 2023-05-01 DIAGNOSIS — Z5982 Transportation insecurity: Secondary | ICD-10-CM | POA: Insufficient documentation

## 2023-05-01 DIAGNOSIS — I10 Essential (primary) hypertension: Secondary | ICD-10-CM

## 2023-05-01 DIAGNOSIS — G2581 Restless legs syndrome: Secondary | ICD-10-CM

## 2023-05-01 DIAGNOSIS — E1169 Type 2 diabetes mellitus with other specified complication: Secondary | ICD-10-CM

## 2023-05-01 DIAGNOSIS — Z7984 Long term (current) use of oral hypoglycemic drugs: Secondary | ICD-10-CM | POA: Diagnosis not present

## 2023-05-01 DIAGNOSIS — M25512 Pain in left shoulder: Secondary | ICD-10-CM | POA: Insufficient documentation

## 2023-05-01 DIAGNOSIS — G8929 Other chronic pain: Secondary | ICD-10-CM | POA: Insufficient documentation

## 2023-05-01 DIAGNOSIS — M25561 Pain in right knee: Secondary | ICD-10-CM | POA: Diagnosis not present

## 2023-05-01 DIAGNOSIS — M25562 Pain in left knee: Secondary | ICD-10-CM | POA: Diagnosis not present

## 2023-05-01 DIAGNOSIS — Z23 Encounter for immunization: Secondary | ICD-10-CM | POA: Diagnosis not present

## 2023-05-01 DIAGNOSIS — R195 Other fecal abnormalities: Secondary | ICD-10-CM | POA: Diagnosis not present

## 2023-05-01 DIAGNOSIS — M25511 Pain in right shoulder: Secondary | ICD-10-CM | POA: Diagnosis not present

## 2023-05-01 DIAGNOSIS — K219 Gastro-esophageal reflux disease without esophagitis: Secondary | ICD-10-CM

## 2023-05-01 DIAGNOSIS — G894 Chronic pain syndrome: Secondary | ICD-10-CM

## 2023-05-01 LAB — POCT GLYCOSYLATED HEMOGLOBIN (HGB A1C): Hemoglobin A1C: 5.6 % (ref 4.0–5.6)

## 2023-05-01 MED ORDER — CYCLOBENZAPRINE HCL 10 MG PO TABS: ORAL_TABLET | ORAL | 0 refills | Status: DC

## 2023-05-01 MED ORDER — OMEPRAZOLE 20 MG PO CPDR
20.0000 mg | DELAYED_RELEASE_CAPSULE | Freq: Every day | ORAL | 3 refills | Status: DC
Start: 2023-05-01 — End: 2024-01-01

## 2023-05-01 MED ORDER — FLUTICASONE PROPIONATE 50 MCG/ACT NA SUSP: 2.0000 | Freq: Every day | NASAL | 3 refills | Status: DC

## 2023-05-01 MED ORDER — METFORMIN HCL ER 500 MG PO TB24
500.0000 mg | ORAL_TABLET | Freq: Every day | ORAL | 3 refills | Status: DC
Start: 2023-05-01 — End: 2024-01-01

## 2023-05-01 MED ORDER — NAPROXEN 500 MG PO TABS
500.0000 mg | ORAL_TABLET | Freq: Two times a day (BID) | ORAL | 0 refills | Status: DC | PRN
Start: 2023-05-01 — End: 2023-09-03

## 2023-05-01 MED ORDER — AMLODIPINE BESYLATE 2.5 MG PO TABS
ORAL_TABLET | ORAL | 3 refills | Status: DC
Start: 2023-05-01 — End: 2024-01-01

## 2023-05-01 MED ORDER — ROPINIROLE HCL 2 MG PO TABS
2.0000 mg | ORAL_TABLET | Freq: Every day | ORAL | 3 refills | Status: DC
Start: 2023-05-01 — End: 2024-01-01

## 2023-05-01 MED ORDER — FUROSEMIDE 20 MG PO TABS
20.0000 mg | ORAL_TABLET | Freq: Every day | ORAL | 3 refills | Status: DC
Start: 2023-05-01 — End: 2024-01-01

## 2023-05-01 NOTE — Assessment & Plan Note (Addendum)
Chronic, stable on metformin XR 500mg  daily. Continue this.  Did not tolerate Ozempic due to vomiting - will stay off this.  Recent knee steroid injection - reviewed limiting sugars/carbs after this due to possible steroid induced hyperglycemia

## 2023-05-01 NOTE — Assessment & Plan Note (Signed)
Chronic, great control on current regimen - continue amlodipine 2.5mg  daily, lasix 20mg  daily.

## 2023-05-01 NOTE — Assessment & Plan Note (Signed)
Positive Cologuard 2019.  Has been unable to schedule colonoscopy with [facility] due to lack of transportation. Will ask care coordinator assistance for this.

## 2023-05-01 NOTE — Telephone Encounter (Signed)
Received fax requesting refill from pharmacy on all pended medication. I have verified with patient change was requested. Would like scripts sent for all pended medications.

## 2023-05-01 NOTE — Telephone Encounter (Signed)
Erx

## 2023-05-01 NOTE — Progress Notes (Signed)
Ph: [phone] Fax: [phone]   Patient ID: Mark [name], male    DOB: June 27, 1956, 67 y.o.   MRN: [id number]  This visit was conducted in person.  BP 130/68   Pulse 80   Temp (!) 97.4 F (36.3 C) (Temporal)   Ht 5' 6.75" (1.695 m)   Wt 211 lb (95.7 kg)   SpO2 97%   BMI 33.30 kg/m    CC: 4 mo DM f/u visit  Subjective:   HPI: Mark [name] is a 67 y.o. male presenting on 05/01/2023 for Medical Management of Chronic Issues (Here for 4 mo DM f/u.)   Positive Cologuard 08/2017 - has still not had colonoscopy. Still having trouble finding transportation for this.   Saw pain clinic yesterday s/p steroid injection into right knee.   S/p 2nd pacemaker 12/2017, sees cardiology Dr Ladona Ridgel and Diona Browner, last seen 11/2022.   OSA - s/p ENT eval, deemed not a candidate for inspire implant until BMI <32.   Brings BP log - great control at home. AM 120-130s/50-70s, PM 110-130/60-70s  DM - does not regularly check sugars. Compliant with antihyperglycemic regimen which includes: metformin XR 500mg  daily. Did not tolerate Ozempic 0.25mg  weekly due to persistent vomiting that started 2d after shot. Denies low sugars or hypoglycemic symptoms. Denies paresthesias, blurry vision. Last diabetic eye exam DUE. Glucometer brand: TrueMetrix. Last foot exam: 08/2022. DSME: declined. Lab Results  Component Value Date   HGBA1C 5.6 05/01/2023   Diabetic Foot Exam - Simple   Simple Foot Form Diabetic Foot exam was performed with the following findings: Yes 05/01/2023  3:59 PM  Visual Inspection See comments: Yes Sensation Testing Intact to touch and monofilament testing bilaterally: Yes Pulse Check Posterior Tibialis and Dorsalis pulse intact bilaterally: Yes Comments No claudication S/p recent R great toenail removal by foot doctor    Lab Results  Component Value Date   MICROALBUR <0.7 12/18/2022         Relevant past medical, surgical, family and social history reviewed and updated as  indicated. Interim medical history since our last visit reviewed. Allergies and medications reviewed and updated. Outpatient Medications Prior to Visit  Medication Sig Dispense Refill   amLODipine (NORVASC) 2.5 MG tablet TAKE 1 TABLET BY MOUTH DAILY (in place of losartan) 90 tablet 3   Ascorbic Acid (VITAMIN C) 1000 MG tablet Take 1,000 mg by mouth daily.     aspirin 81 MG tablet Take 81 mg by mouth daily.     atorvastatin (LIPITOR) 20 MG tablet Take 1 tablet (20 mg total) by mouth at bedtime. 90 tablet 4   B Complex-C (SUPER B COMPLEX/VITAMIN C PO) Take by mouth daily.     Cholecalciferol (VITAMIN D3 PO) Take 2,000 Int'l Units by mouth daily.     Cyanocobalamin (VITAMIN B12 PO) Take by mouth daily.     cyclobenzaprine (FLEXERIL) 10 MG tablet TAKE ONE-HALF TO ONE TABLET BY MOUTH 2 TIMES DAILY AS NEEDED FOR MUSCLE SPASMS 40 tablet 0   diphenhydrAMINE (BENADRYL ALLERGY) 25 MG tablet Take 1 tablet (25 mg total) by mouth 2 (two) times daily as needed for allergies.     FLUoxetine (PROZAC) 20 MG capsule Take 1 capsule (20 mg total) by mouth daily. 90 capsule 4   FLUoxetine (PROZAC) 40 MG capsule Take 1 capsule (40 mg total) by mouth daily. 90 capsule 3   fluticasone (FLONASE) 50 MCG/ACT nasal spray Place 2 sprays into both nostrils daily. 48 g 3   furosemide (LASIX)  20 MG tablet Take 1 tablet (20 mg total) by mouth daily. 90 tablet 3   Garlic 100 MG TABS Take by mouth daily.     metFORMIN (GLUCOPHAGE-XR) 500 MG 24 hr tablet Take 1 tablet (500 mg total) by mouth daily with breakfast. 90 tablet 3   Multiple Vitamins-Minerals (CERTAVITE SENIOR/ANTIOXIDANT) TABS Take 1 tablet by mouth daily.     naloxone (NARCAN) nasal spray 4 mg/0.1 mL Place 1 spray into the nose as needed for up to 365 doses (for opioid-induced respiratory depresssion). In case of emergency (overdose), spray once into each nostril. If no response within 3 minutes, repeat application and call 911. 1 each 0   naproxen (NAPROSYN) 500 MG  tablet Take 1 tablet (500 mg total) by mouth 2 (two) times daily as needed for moderate pain. 90 tablet 0   Omega-3 Fatty Acids (FISH OIL) 1000 MG CAPS Take by mouth daily.     omeprazole (PRILOSEC) 20 MG capsule Take 1 capsule (20 mg total) by mouth daily. 90 capsule 3   [START ON 05/03/2023] Oxycodone HCl 10 MG TABS Take 1 tablet (10 mg total) by mouth every 8 (eight) hours as needed. Each refill must last 30 days. 90 tablet 0   [START ON 06/02/2023] Oxycodone HCl 10 MG TABS Take 1 tablet (10 mg total) by mouth every 8 (eight) hours as needed. Each refill must last 30 days. 90 tablet 0   [START ON 07/02/2023] Oxycodone HCl 10 MG TABS Take 1 tablet (10 mg total) by mouth every 8 (eight) hours as needed. Each refill must last 30 days. 90 tablet 0   potassium citrate (UROCIT-K) 10 MEQ (1080 MG) SR tablet TAKE 1 TABLET BY MOUTH TWICE DAILY 180 tablet 1   rOPINIRole (REQUIP) 2 MG tablet Take 1 tablet (2 mg total) by mouth at bedtime. 90 tablet 3   No facility-administered medications prior to visit.     Per HPI unless specifically indicated in ROS section below Review of Systems  Objective:  BP 130/68   Pulse 80   Temp (!) 97.4 F (36.3 C) (Temporal)   Ht 5' 6.75" (1.695 m)   Wt 211 lb (95.7 kg)   SpO2 97%   BMI 33.30 kg/m   Wt Readings from Last 3 Encounters:  05/01/23 211 lb (95.7 kg)  04/30/23 217 lb (98.4 kg)  01/29/23 212 lb (96.2 kg)      Physical Exam Vitals and nursing note reviewed.  Constitutional:      Appearance: Normal appearance. He is not ill-appearing.  Eyes:     Extraocular Movements: Extraocular movements intact.     Conjunctiva/sclera: Conjunctivae normal.     Pupils: Pupils are equal, round, and reactive to light.  Cardiovascular:     Rate and Rhythm: Normal rate and regular rhythm.     Pulses: Normal pulses.     Heart sounds: Normal heart sounds. No murmur heard. Pulmonary:     Effort: Pulmonary effort is normal. No respiratory distress.     Breath  sounds: Normal breath sounds. No wheezing, rhonchi or rales.  Musculoskeletal:     Right lower leg: No edema.     Left lower leg: No edema.     Comments: See HPI for foot exam if done  Skin:    General: Skin is warm and dry.     Findings: No rash.  Neurological:     Mental Status: He is alert.  Psychiatric:        Mood and Affect:  Mood normal.        Behavior: Behavior normal.       Results for orders placed or performed in visit on 05/01/23  POCT glycosylated hemoglobin (Hb A1C)  Result Value Ref Range   Hemoglobin A1C 5.6 4.0 - 5.6 %   HbA1c POC (<> result, manual entry)     HbA1c, POC (prediabetic range)     HbA1c, POC (controlled diabetic range)      Assessment & Plan:   Problem List Items Addressed This Visit     Hypertension    Chronic, great control on current regimen - continue amlodipine 2.5mg  daily, lasix 20mg  daily.       Positive colorectal cancer screening using Cologuard test    Positive Cologuard 2019.  Has been unable to schedule colonoscopy with [facility] due to lack of transportation. Will ask care coordinator assistance for this.       Relevant Orders   AMB Referral to [facility]ordinaton ([facility] Patients)   Type 2 diabetes mellitus with other specified complication (HCC) - Primary    Chronic, stable on metformin XR 500mg  daily. Continue this.  Did not tolerate Ozempic due to vomiting - will stay off this.  Recent knee steroid injection - reviewed limiting sugars/carbs after this due to possible steroid induced hyperglycemia       Relevant Orders   POCT glycosylated hemoglobin (Hb A1C) (Completed)   Problem with transportation    Positive Cologuard 2019.  Has been unable to schedule colonoscopy with [facility] due to lack of transportation. Will ask care coordinator assistance for this.       Relevant Orders   AMB Referral to [facility] ([facility] Patients)   Other Visit Diagnoses     Encounter for immunization        Relevant Orders   Flu Vaccine Trivalent High Dose (Fluad) (Completed)        No orders of the defined types were placed in this encounter.   Orders Placed This Encounter  Procedures   Flu Vaccine Trivalent High Dose (Fluad)   AMB Referral to [facility]ordinaton ([facility] Patients)    Referral Priority:   Routine    Referral Type:   Consultation    Referral Reason:   Care Coordination    Number of Visits Requested:   1   POCT glycosylated hemoglobin (Hb A1C)    Patient Instructions  Flu shot today  Call and schedule diabetic eye exam, send [place] report when complete.  I will ask our care coordinator to contact you to see if there's any transportation options covered by your insurance to get your colonoscopy scheduled.  Good to see you today Return as needed or in 8 months for follow up visit   Follow up plan: Return in about 8 months (around 12/29/2023), or if symptoms worsen or fail to improve, for annual exam, prior fasting for blood work, medicare wellness visit.  [name], MD

## 2023-05-01 NOTE — Patient Instructions (Addendum)
Flu shot today  Call and schedule diabetic eye exam, send [place] report when complete.  I will ask our care coordinator to contact you to see if there's any transportation options covered by your insurance to get your colonoscopy scheduled.  Good to see you today Return as needed or in 8 months for follow up visit

## 2023-05-02 ENCOUNTER — Telehealth: Payer: Self-pay | Admitting: *Deleted

## 2023-05-02 NOTE — Progress Notes (Signed)
Care Coordination   Note   05/02/2023 Name: Mark [name] MRN: [id number] DOB: 10/30/1955  Mark [name] is a 67 y.o. year old male who sees [name], MD for primary care. I reached out to [name] by phone today to offer care coordination services.  [name] was given information about Care Coordination services today including:   The Care Coordination services include support from the care team which includes your Nurse Coordinator, Clinical Social Worker, or Pharmacist.  The Care Coordination team is here to help remove barriers to the health concerns and goals most important to you. Care Coordination services are voluntary, and the patient may decline or stop services at any time by request to their care team member.   Care Coordination Consent Status: Patient agreed to services and verbal consent obtained.   Follow up plan:  Telephone appointment with care coordination team member scheduled for:  05/07/2023  Encounter Outcome:  Patient Scheduled from referral   Burman Nieves, [facility] 2 Care Coordination Care Guide Direct Dial: [phone]

## 2023-05-03 ENCOUNTER — Other Ambulatory Visit: Payer: Self-pay | Admitting: *Deleted

## 2023-05-03 MED ORDER — POTASSIUM CITRATE ER 10 MEQ (1080 MG) PO TBCR: 10.0000 meq | EXTENDED_RELEASE_TABLET | Freq: Two times a day (BID) | ORAL | 1 refills | Status: DC

## 2023-05-04 ENCOUNTER — Telehealth: Payer: Self-pay

## 2023-05-04 DIAGNOSIS — E1169 Type 2 diabetes mellitus with other specified complication: Secondary | ICD-10-CM

## 2023-05-04 MED ORDER — ATORVASTATIN CALCIUM 20 MG PO TABS
20.0000 mg | ORAL_TABLET | Freq: Every day | ORAL | 2 refills | Status: DC
Start: 2023-05-04 — End: 2024-01-01

## 2023-05-04 NOTE — Telephone Encounter (Signed)
E-scribed refill, per faxed request.

## 2023-05-07 ENCOUNTER — Ambulatory Visit: Payer: Self-pay

## 2023-05-07 NOTE — Patient Instructions (Signed)
Visit Information  Thank you for taking time to visit with me today. Please don't hesitate to contact me if I can be of assistance to you.   Following are the goals we discussed today:  Patient to use insurance transportation and will speak to medical provider to confirm if he can go to the procedure by himself.   Our next appointment is by telephone on 05/10/23  at 2:30pm  Please call the care guide team at [phone] if you need to cancel or reschedule your appointment.   If you are experiencing a Mental Health or Behavioral Health Crisis or need someone to talk to, please call 911  Patient verbalizes understanding of instructions and care plan provided today and agrees to view in MyChart. Active MyChart status and patient understanding of how to access instructions and care plan via MyChart confirmed with patient.     Telephone follow up appointment with care management team member scheduled for: 05/10/23 at 2:30pm  Lysle Morales, BSW Social Worker [phone]

## 2023-05-07 NOTE — Patient Outreach (Addendum)
Care Coordination   Initial Visit Note   05/07/2023 Name: Mark [name] MRN: [id number] DOB: 09-29-1955  Mark [name] is a 67 y.o. year old male who sees Mark Boyden, MD for primary care. I spoke with  Mark [name] by phone today.  What matters to the patients health and wellness today?  Patient needs a procedure but doesn't have transportation or someone to go with him. Patient drives but doesn't have anyone to take him for the procedure.     Goals Addressed             This Visit's Progress    Patient Stated       Interventions Today    Flowsheet Row Most Recent Value  Chronic Disease   Chronic disease during today's visit Diabetes, Hypertension (HTN)  General Interventions   General Interventions Discussed/Reviewed General Interventions Discussed, General Interventions Reviewed, Communication with  [Pt request help with transportation and assistant to go with him for a colonoscopy. His mother is over 44 and limited mobility. He has no family or friends to attend the medical visit.Pt to use [employer] transportation and ask md if he is required someone.]  Communication with --  [SW t/c [facility] for assistance with someone ride along. SW t/c [employer] and pt is eligible for 24 rides]              SDOH assessments and interventions completed:  Yes  SDOH Interventions Today    Flowsheet Row Most Recent Value  SDOH Interventions   Food Insecurity Interventions Intervention Not Indicated, Other (Comment)  [Foodstamps]  Housing Interventions Intervention Not Indicated  Transportation Interventions Intervention Not Indicated  Utilities Interventions Intervention Not Indicated        Care Coordination Interventions:  Yes, provided   Follow up plan: Follow up call scheduled for 05/10/23 at 2:30 pm.    Encounter Outcome:  Patient Visit Completed

## 2023-05-10 ENCOUNTER — Ambulatory Visit: Payer: Self-pay

## 2023-05-10 ENCOUNTER — Telehealth: Payer: Self-pay | Admitting: Family Medicine

## 2023-05-10 DIAGNOSIS — R195 Other fecal abnormalities: Secondary | ICD-10-CM

## 2023-05-10 NOTE — Patient Instructions (Signed)
Visit Information  Thank you for taking time to visit with me today. Please don't hesitate to contact me if I can be of assistance to you.   Following are the goals we discussed today:  Patient will wait for a contact from the doctor to inquire about attending the procedure without someone with him.   If you are experiencing a Mental Health or Behavioral Health Crisis or need someone to talk to, please call 911  Patient verbalizes understanding of instructions and care plan provided today and agrees to view in MyChart. Active MyChart status and patient understanding of how to access instructions and care plan via MyChart confirmed with patient.     No further follow up required: Patient does not request a follow up visit.

## 2023-05-10 NOTE — Patient Outreach (Signed)
Care Coordination   Follow Up Visit Note   05/10/2023 Name: Mark [name] MRN: [id number] DOB: May 16, 1956  Mark [name] is a 67 y.o. year old male who sees [name], MD for primary care. I spoke with  [name] by phone today.  What matters to the patients health and wellness today?  Patient needs to confirm with the provider if he can have a colonoscopy if no one goes with him for the procedure.    Goals Addressed             This Visit's Progress    Patient Stated       Interventions Today    Flowsheet Row Most Recent Value  General Interventions   General Interventions Discussed/Reviewed General Interventions Discussed, General Interventions Reviewed, Doctor Visits  [Pt has communicated with the doctor for a referral for the colonoscopy. Pt is waiting a response and will inquire about attending the procedure without someone present. Pt does not wish to have a follow up visit.]              SDOH assessments and interventions completed:  No     Care Coordination Interventions:  Yes, provided   Follow up plan: No further intervention required.   Encounter Outcome:  Patient Visit Completed

## 2023-05-10 NOTE — Addendum Note (Signed)
Addended by: [name] on: 05/10/2023 12:44 PM   Modules accepted: Orders

## 2023-05-10 NOTE — Telephone Encounter (Signed)
Pt called stating it was time for his colonoscopy. Pt requested a referral to Dr. Katrinka Blazing Mayorga @ [facility] at [facility] in [place], [place]? Call back # [phone]

## 2023-05-10 NOTE — Telephone Encounter (Signed)
New referral placed to Dr Levon Hedger [place] [place].

## 2023-05-10 NOTE — Telephone Encounter (Signed)
Spoke with pt notifying him requested referral has been placed. Pt expresses his thanks.

## 2023-05-11 ENCOUNTER — Encounter (INDEPENDENT_AMBULATORY_CARE_PROVIDER_SITE_OTHER): Payer: Self-pay | Admitting: *Deleted

## 2023-05-17 ENCOUNTER — Ambulatory Visit: Payer: Medicare Other | Attending: Pain Medicine | Admitting: Pain Medicine

## 2023-05-17 ENCOUNTER — Encounter: Payer: Self-pay | Admitting: Pain Medicine

## 2023-05-17 VITALS — BP 115/91 | HR 68 | Temp 97.2°F | Resp 16 | Ht 67.0 in | Wt 213.0 lb

## 2023-05-17 DIAGNOSIS — G8929 Other chronic pain: Secondary | ICD-10-CM | POA: Diagnosis not present

## 2023-05-17 DIAGNOSIS — M25561 Pain in right knee: Secondary | ICD-10-CM

## 2023-05-17 DIAGNOSIS — Z09 Encounter for follow-up examination after completed treatment for conditions other than malignant neoplasm: Secondary | ICD-10-CM

## 2023-05-17 DIAGNOSIS — M25562 Pain in left knee: Secondary | ICD-10-CM

## 2023-05-17 NOTE — Progress Notes (Signed)
PROVIDER NOTE: Information contained herein reflects review and annotations entered in association with encounter. Interpretation of such information and data should be left to medically-trained personnel. Information provided to patient can be located elsewhere in the medical record under "Patient Instructions". Document created using STT-dictation technology, any transcriptional errors that may result from process are unintentional.    Patient: Mark [name]  Service Category: E/M  Provider: Oswaldo Done, MD  DOB: [birth year]  DOS: 05/17/2023  Referring Provider: [name], MD  MRN: [id number]  Specialty: Interventional Pain Management  PCP: [name], MD  Type: Established Patient  Setting: Ambulatory outpatient    Location: Office  Delivery: Face-to-face     HPI  [name], a 67 y.o. year old male, is here today because of his Chronic pain of right knee [M25.561, G89.29]. [name] primary complain today is Shoulder Pain (left), Neck Pain, and Knee Pain (right)  Pertinent problems: 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); Cervicogenic headache; Cervical spondylosis with radiculopathy (Bilateral) (L>R); Cervical facet syndrome (Bilateral) (L>R); Cervical foraminal stenosis (Severe C6-7) (Left); Chronic sacroiliac joint pain (Bilateral); Osteoarthritis of sacroiliac joint (Bilateral); Lumbar facet hypertrophy (L1-2, L2-3, and L4-5) (Bilateral); Lumbar IVDD (intervertebral disc displacement); 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); Lumbar facet syndrome (Bilateral); Osteoarthritis of shoulder (Left); Osteoarthritis of knee (Bilateral) (R>L); Chronic cervical radicular pain; Chronic fatigue; RLS (restless legs syndrome); Trigger finger of index finger (Right);  Cervicalgia; Trigger finger of thumb (Right); Chronic thumb pain (Left); Trigger middle finger of right hand; Chronic hand pain (Left); Osteoarthritis of first carpometacarpal joint (thumb) of hand (Left); Trigger point with back pain (Left); Trigger point of shoulder region (scapula) (Left); History of laparoscopic cholecystectomy (10/27/2020); DDD (degenerative disc disease), cervical; Chronic low back pain (Bilateral) w/o sciatica; Chronic shoulder pain (Bilateral) (L>R); Chronic shoulder pain (Left); and Chronic knee pain (Right) on their pertinent problem list. Pain Assessment: Severity of Chronic pain is reported as a 3 /10. Location: Shoulder Left/radiates up into neck. Onset: More than a month ago. Quality: Aching, Discomfort, Constant, Dull, Heaviness. Timing: Constant. Modifying factor(s): medication, rest. Vitals:  height is 5\' 7"  (1.702 m) and weight is 213 lb (96.6 kg). His temperature is 97.2 F (36.2 C) (abnormal). His blood pressure is 115/91 (abnormal) and his pulse is 68. His respiration is 16 and oxygen saturation is 100%.  BMI: Estimated body mass index is 33.36 kg/m as calculated from the following:   Height as of this encounter: 5\' 7"  (1.702 m).   Weight as of this encounter: 213 lb (96.6 kg). Last encounter: 04/30/2023. Last procedure: 04/30/2023.  (Right intra-articular knee injection)  Reason for encounter: post-procedure evaluation and assessment.  The patient indicates having attained an ongoing 90% improvement of his right knee pain.  Today we # went over the results of his shoulder and knee x-rays, both of which were read as negative for any acute pathology or injuries.  The patient was encouraged to give [place] a call if his knee pain starts getting worse again.  Post-procedure evaluation   Type:  Steroid Intra-articular Knee Injection #1  Laterality: Right (-RT) Level/approach: Lateral Imaging guidance: None required ([id number]) Anesthesia: Local anesthesia (1-2%  Lidocaine) Anxiolysis: None                 Sedation: No Sedation  DOS: 04/30/2023  Performed by: Oswaldo Done, MD  Purpose: Diagnostic/Therapeutic Indications: Knee arthralgia associated to osteoarthritis of the knee 1. Chronic knee pain (Right)   2. Osteoarthritis of knee (Bilateral) (R>L)   3. Chronic knee pain (Bilateral) (R>L)    NAS-11 score:   Pre-procedure: 4 /10   Post-procedure: 4 /10     Effectiveness:  Initial hour after procedure: 80 %. Subsequent 4-6 hours post-procedure: 80 %. Analgesia past initial 6 hours: 90 % (ongoing). Ongoing improvement:  Analgesic: The patient indicated having attained an 80% relief of the pain for the duration of the local anesthetic followed by an ongoing 90% improvement of his right knee pain.  Right now the only thing that he complains of is still heaviness. Function: Mark [name] reports improvement in function ROM: Mark [name] reports improvement in ROM  Pharmacotherapy Assessment  Analgesic: Oxycodone IR 10 mg, 1 tab PO TID (30 mg/day of oxycodone) MME/day: 45 mg/day.   Monitoring: [place] PMP: PDMP reviewed during this encounter.       Pharmacotherapy: No side-effects or adverse reactions reported. Compliance: No problems identified. Effectiveness: Clinically acceptable.  Valerie Salts, RN  05/17/2023  1:57 PM  Sign when Signing Visit Safety precautions to be maintained throughout the outpatient stay will include: orient to surroundings, keep bed in low position, maintain call bell within reach at all times, provide assistance with transfer out of bed and ambulation.    No results found for: "CBDTHCR" No results found for: "D8THCCBX" No results found for: "D9THCCBX"  UDS:  Summary  Date Value Ref Range Status  01/29/2023 Note  Final    Comment:    ==================================================================== [employer] [facility] [employer]  (MW) ==================================================================== Test                             Result       Flag       Units  Drug Present   Oxycodone                      1796                    ng/mg creat   Oxymorphone                    153                     ng/mg creat   Noroxycodone                   4143                    ng/mg creat    Sources of oxycodone include scheduled prescription medications.    Oxymorphone and noroxycodone are expected metabolites of oxycodone.    Oxymorphone is also available as a scheduled prescription medication.  ==================================================================== Test                      Result    Flag   Units      Ref Range   Creatinine              68               mg/dL      >=16 ==================================================================== Declared Medications:  Medication list was not provided. ==================================================================== For clinical consultation, please call [phone]. ====================================================================  ROS  Constitutional: Denies any fever or chills Gastrointestinal: No reported hemesis, hematochezia, vomiting, or acute GI distress Musculoskeletal: Denies any acute onset joint swelling, redness, loss of ROM, or weakness Neurological: No reported episodes of acute onset apraxia, aphasia, dysarthria, agnosia, amnesia, paralysis, loss of coordination, or loss of consciousness  Medication Review  B Complex-C, CertaVite Senior/Antioxidant, Cholecalciferol, Cyanocobalamin, FLUoxetine, Fish Oil, Garlic, Oxycodone HCl, amLODipine, aspirin, atorvastatin, cyclobenzaprine, diphenhydrAMINE, fluticasone, furosemide, metFORMIN, naloxone, naproxen, omeprazole, potassium citrate, rOPINIRole, and vitamin C  History Review  Allergy: Mr. Imel is allergic to ciprofloxacin, losartan, metformin, buprenorphine hcl, morphine and  codeine, and penicillins. Drug: Mr. Willock  reports no history of drug use. Alcohol:  reports no history of alcohol use. Tobacco:  reports that he has never smoked. He has never used smokeless tobacco. Social: Mr. Digirolamo  reports that he has never smoked. He has never used smokeless tobacco. He reports that he does not drink alcohol and does not use drugs. Medical:  has a past medical history of Anxiety, Arthritis, Chronic pain, Depression, Heart murmur, History of kidney stones (11/17/2015), Hydrocele in adult (05/07/2015), Hypertension, Kidney stones, Migraine, MRSA carrier (2019), Presence of permanent cardiac pacemaker, Prostatitis, and Sleep apnea. Surgical: Mr. Vanderford  has a past surgical history that includes Tonsillectomy (1964); Carpal tunnel release (Bilateral, 2006); Ulnar nerve repair (2008); Cervical spine surgery (2008); Rotator cuff repair (Left, 2009); pacemaker placement (2009); Lithotripsy (Bilateral, 2012); Lithotripsy (Left, 11/2015); Cardiovascular stress test (2014); Colonoscopy (07/2007); implantable cardioverter defibrillator (icd) generator change (Left, 12/13/2017); pace maker revision (12/13/2017); Laparoscopic cholecystectomy (10/2020); Cholecystectomy (N/A, 10/27/2020); ERCP (N/A, 10/26/2020); Esophagogastroduodenoscopy (egd) with propofol (N/A, 10/26/2020); EUS (10/26/2020); Sphincterotomy (10/26/2020); and removal of stones (10/26/2020). Family: family history includes Arthritis in his father and mother; Cancer in his father and mother; Diabetes in his mother; Heart block in his father; Heart disease in his father and mother.  Laboratory Chemistry Profile   Renal Lab Results  Component Value Date   BUN 24 (H) 12/18/2022   CREATININE 1.02 12/18/2022   GFR 76.89 12/18/2022   GFRAA >60 12/06/2017   GFRNONAA >60 10/29/2020    Hepatic Lab Results  Component Value Date   AST 31 12/18/2022   ALT 40 12/18/2022   ALBUMIN 3.9 12/18/2022   ALKPHOS 78 12/18/2022   HCVAB NEGATIVE  07/22/2013   LIPASE 39 10/28/2020    Electrolytes Lab Results  Component Value Date   NA 138 12/18/2022   K 3.9 12/18/2022   CL 100 12/18/2022   CALCIUM 9.1 12/18/2022   MG 2.1 09/01/2015    Bone Lab Results  Component Value Date   25OHVITD1 63 02/02/2016   25OHVITD2 <1.0 02/02/2016   25OHVITD3 63 02/02/2016   TESTOSTERONE 495 08/08/2017    Inflammation (CRP: Acute Phase) (ESR: Chronic Phase) Lab Results  Component Value Date   CRP 1.7 (H) 09/01/2015   ESRSEDRATE 6 09/01/2015         Note: Above Lab results reviewed.  Recent Imaging Review  DG Shoulder Left CLINICAL DATA:  Chronic left shoulder pain x 10 years  EXAM: LEFT SHOULDER - 2+ VIEW  COMPARISON:  None Available.  FINDINGS: No fracture or dislocation is seen.  The joint spaces are preserved.  The visualized soft tissues are unremarkable.  Visualized left lung is clear.  IMPRESSION: Negative.  Electronically Signed   By: Charline Bills M.D.   On: 05/16/2023 14:06 DG Knee Complete 4 Views Right CLINICAL DATA:  Right knee pain, fall 2 months ago  EXAM: RIGHT KNEE -  COMPLETE 4+ VIEW  COMPARISON:  None Available.  FINDINGS: No fracture or dislocation is seen.  The joint spaces are preserved.  The visualized soft tissues are unremarkable.  No suprapatellar knee joint effusion.  IMPRESSION: Negative.  Electronically Signed   By: Charline Bills M.D.   On: 05/16/2023 14:06 Note: Reviewed        Physical Exam  General appearance: Well nourished, well developed, and well hydrated. In no apparent acute distress Mental status: Alert, oriented x 3 (person, place, & time)       Respiratory: No evidence of acute respiratory distress Eyes: PERLA Vitals: BP (!) 115/91   Pulse 68   Temp (!) 97.2 F (36.2 C)   Resp 16   Ht 5\' 7"  (1.702 m)   Wt 213 lb (96.6 kg)   SpO2 100%   BMI 33.36 kg/m  BMI: Estimated body mass index is 33.36 kg/m as calculated from the following:   Height as  of this encounter: 5\' 7"  (1.702 m).   Weight as of this encounter: 213 lb (96.6 kg). Ideal: Ideal body weight: 66.1 kg (145 lb 11.6 oz) Adjusted ideal body weight: 78.3 kg (172 lb 10.1 oz)  Assessment   Diagnosis Status  1. Chronic knee pain (Right)   2. Chronic knee pain (Bilateral) (R>L)   3. Postop check    Controlled Controlled Controlled   Updated Problems: No problems updated.  Plan of Care  Problem-specific:  No problem-specific Assessment & Plan notes found for this encounter.  Mr. ZAYLON GARAVITO has a current medication list which includes the following long-term medication(s): amlodipine, atorvastatin, diphenhydramine, fluoxetine, fluoxetine, fluticasone, furosemide, metformin, omeprazole, oxycodone hcl, [START ON 06/02/2023] oxycodone hcl, [START ON 07/02/2023] oxycodone hcl, and ropinirole.  Pharmacotherapy (Medications Ordered): No orders of the defined types were placed in this encounter.  Orders:  Orders Placed This Encounter  Procedures   Nursing Instructions:    Please complete this patient's postprocedure evaluation.    Scheduling Instructions:     Please complete this patient's postprocedure evaluation.   Follow-up plan:   Return for scheduled encounter.      Interventional Therapies  Risk  Complexity Considerations:   WNL   Planned  Pending:   Diagnostic left IA shoulder joint injection #1 vs left suprascapular nerve block #1    Under consideration:   Diagnostic right hand digit 2 trigger finger injection Therapeutic bilateral IA Hyalgan knee injections  Diagnostic bilateral cervical facet block  Possible bilateral cervical facet RFA  Diagnostic left sided CESI  Diagnostic bilateral IA knee injection  Diagnostic bilateral genicular NB  Possible bilateral genicular nerve RFA  Diagnostic bilateral sacroiliac joint block  Possible bilateral sacroiliac joint RFA  Diagnostic bilateral IA shoulder joint injection  Diagnostic bilateral  suprascapular NB  Possible bilateral suprascapular nerve RFA  Diagnostic bilateral lumbar facet block  Possible bilateral lumbar facet RFA  Diagnostic left L4-5 LESI    Completed:   Therapeutic left levator scapula, supraspinatus, and rhomboid muscle TPI/MNB x1 (03/30/2020) (100/100/85/75)  Diagnostic left C7-T1 cervical ESI x1 (06/21/2016) (90/90/70/75)  Diagnostic bilateral IA Hyalgan knee inj. x3 (06/21/2016) (left: 100/90/100/90/75) (right: 90/90/80/75)  Diagnostic/therapeutic right thumb inj. x1 (05/23/2016) (n/a) Diagnostic/therapeutic left thumb (CMC-Trapezium) inj. x1 (03/11/2020) (100/100/75/75)    Therapeutic  Palliative (PRN) options:   Therapeutic IA Hyalgan knee injections       Recent Visits Date Type Provider Dept  04/30/23 Office Visit [name], MD [facility]  Showing recent visits within past 90 days and  meeting all other requirements Today's Visits Date Type Provider Dept  05/17/23 Office Visit [name], MD [facility]  Showing today's visits and meeting all other requirements Future Appointments Date Type Provider Dept  07/25/23 Appointment [name], MD [facility]  Showing future appointments within next 90 days and meeting all other requirements  I discussed the assessment and treatment plan with the patient. The patient was provided an opportunity to ask questions and all were answered. The patient agreed with the plan and demonstrated an understanding of the instructions.  Patient advised to call back or seek an in-person evaluation if the symptoms or condition worsens.  Duration of encounter: 30 minutes.  Total time on encounter, as per AMA guidelines included both the face-to-face and non-face-to-face time personally spent by the physician and/or other qualified health care professional(s) on the day of the encounter (includes time in activities that require the physician or other qualified health  care professional and does not include time in activities normally performed by clinical staff). Physician's time may include the following activities when performed: Preparing to see the patient (e.g., pre-charting review of records, searching for previously ordered imaging, lab work, and nerve conduction tests) Review of prior analgesic pharmacotherapies. Reviewing PMP Interpreting ordered tests (e.g., lab work, imaging, nerve conduction tests) Performing post-procedure evaluations, including interpretation of diagnostic procedures Obtaining and/or reviewing separately obtained history Performing a medically appropriate examination and/or evaluation Counseling and educating the patient/family/caregiver Ordering medications, tests, or procedures Referring and communicating with other health care professionals (when not separately reported) Documenting clinical information in the electronic or other health record Independently interpreting results (not separately reported) and communicating results to the patient/ family/caregiver Care coordination (not separately reported)  Note by: Oswaldo Done, MD Date: 05/17/2023; Time: 2:09 PM

## 2023-05-17 NOTE — Progress Notes (Signed)
Safety precautions to be maintained throughout the outpatient stay will include: orient to surroundings, keep bed in low position, maintain call bell within reach at all times, provide assistance with transfer out of bed and ambulation.  

## 2023-05-21 ENCOUNTER — Encounter (INDEPENDENT_AMBULATORY_CARE_PROVIDER_SITE_OTHER): Payer: Self-pay | Admitting: Gastroenterology

## 2023-05-21 ENCOUNTER — Ambulatory Visit (INDEPENDENT_AMBULATORY_CARE_PROVIDER_SITE_OTHER): Payer: Medicare Other | Admitting: Gastroenterology

## 2023-05-21 VITALS — BP 111/74 | HR 64 | Temp 98.0°F | Ht 67.0 in | Wt 212.6 lb

## 2023-05-21 DIAGNOSIS — K5900 Constipation, unspecified: Secondary | ICD-10-CM

## 2023-05-21 DIAGNOSIS — R195 Other fecal abnormalities: Secondary | ICD-10-CM | POA: Diagnosis not present

## 2023-05-21 DIAGNOSIS — K5904 Chronic idiopathic constipation: Secondary | ICD-10-CM | POA: Insufficient documentation

## 2023-05-21 DIAGNOSIS — Z6833 Body mass index (BMI) 33.0-33.9, adult: Secondary | ICD-10-CM | POA: Diagnosis not present

## 2023-05-21 MED ORDER — POLYETHYLENE GLYCOL 3350 17 G PO PACK
17.0000 g | PACK | Freq: Two times a day (BID) | ORAL | 0 refills | Status: AC
Start: 2023-05-21 — End: 2023-08-19

## 2023-05-21 MED ORDER — PSYLLIUM 58.6 % PO PACK
1.0000 | PACK | Freq: Two times a day (BID) | ORAL | 2 refills | Status: AC
Start: 2023-05-21 — End: 2023-08-19

## 2023-05-21 NOTE — H&P (View-Only) (Signed)
Vista Lawman , M.D. Gastroenterology & Hepatology [facility] [address] [place], [place] [address] Primary Care Physician: [name], MD [address] [place] [address]  Chief Complaint:  Positive cologuard   History of Present Illness: Mark [name] is a 67 y.o. male with symptomatic bradycardia s/p pacemaker, HTN who presents for evaluation of positive cologuard   Patient has a positive Cologuard test since 2019.  Reports he noticed at that time he was dealing with hemorrhoids and was straining.  He did not had a follow-up colonoscopy since. Patient has a bowel movement daily but sometimes have to strain. The patient denies having any nausea, vomiting, fever, chills, hematochezia, melena, hematemesis, abdominal distention, abdominal pain, diarrhea, jaundice, pruritus or weight loss.  Last [id number]  - Normal esophagus. - Normal stomach. - Duodenal erosions with bleeding on contact. - Hyperechoic material consistent with sludge was visualized endosonographically in the common bile duct. - There was no sign of significant pathology in the pancreatic head, genu of the pancreas, pancreatic body and pancreatic tail. - No specimens collected.  - The major papilla appeared normal. - A biliary sphincterotomy was performed. - The biliary tree was swept and sludge was found.  Last Colonoscopy: 2008 in [place] Nonbleeding internal hemorrhoids suggest repeat 5 years  FHx: neg for any gastrointestinal/liver disease, no malignancies Social: neg smoking, alcohol or illicit drug use Surgical: Cholecystectomy  Past Medical History: Past Medical History:  Diagnosis Date   Anxiety    Arthritis    Chronic pain    Depression    Heart murmur    History of kidney stones 11/17/2015   Removed two kidney stones about 1 cm in size   Hydrocele in adult 05/07/2015   Hypertension    Kidney stones    Migraine    MRSA carrier  2019   Presence of permanent cardiac pacemaker    Medtronic implanted 2009 for sinus node dysfunction: generator change 2019   Prostatitis    Sleep apnea    Does not wear CPAP    Past Surgical History: Past Surgical History:  Procedure Laterality Date   CARDIOVASCULAR STRESS TEST  2014   Dr Philomena Course TUNNEL RELEASE Bilateral 2006   CERVICAL SPINE SURGERY  2008   C5/6   CHOLECYSTECTOMY N/A 10/27/2020   Procedure: LAPAROSCOPIC CHOLECYSTECTOMY;  Surgeon: [name], MD;  Location: [facility] OR;  Service: General;  Laterality: N/A;   COLONOSCOPY  07/2007   prostate nodule, hemorrhoid, rpt 5 yrs Mechele Collin)   ERCP N/A 10/26/2020   Procedure: ENDOSCOPIC RETROGRADE CHOLANGIOPANCREATOGRAPHY (ERCP);  Surgeon: Jeani Hawking, MD;  Location: [facility] ENDOSCOPY;  Service: Endoscopy;  Laterality: N/A;  choledocholithiasis   ESOPHAGOGASTRODUODENOSCOPY (EGD) WITH PROPOFOL N/A 10/26/2020   Procedure: ESOPHAGOGASTRODUODENOSCOPY (EGD) WITH PROPOFOL;  Surgeon: Jeani Hawking, MD;  Location: [facility] ENDOSCOPY;  Service: Endoscopy;  Laterality: N/A;   EUS  10/26/2020   Procedure: UPPER ENDOSCOPIC ULTRASOUND (EUS) LINEAR;  Surgeon: Jeani Hawking, MD;  Location[facility] END[facility]COPY;  Service: Endoscopy;;   IMPLANTABLE CARDIOVERTER DEFIBRILLATOR (ICD) GENERATOR CHANGE Left 12/13/2017   Procedure: PACER CHANGE OUT;  Surgeon: Marcina Millard, MD;  Location: [facility] ORS;  Service: Cardiovascular;  Laterality: Left;   LAPAROSCOPIC CHOLECYSTECTOMY  10/2020   LITHOTRIPSY Bilateral 2012   Stoioff   LITHOTRIPSY Left 11/2015   x2 Stoioff   pace maker revision  12/13/2017   PACEMAKER PLACEMENT  2009   s/p Medtronic Adapta DR Pacemaker (Dr. Lady Gary)   REMOVAL OF  STONES  10/26/2020   Procedure: REMOVAL OF STONES;  Surgeon: Jeani Hawking, MD;  Location: [facility] ENDOSCOPY;  Service: Endoscopy;;   ROTATOR CUFF REPAIR Left 2009   SPHINCTEROTOMY  10/26/2020   Procedure: SPHINCTEROTOMY;  Surgeon: Jeani Hawking, MD;  Location: [facility] ENDOSCOPY;  Service:  Endoscopy;;   TONSILLECTOMY  1964   ULNAR NERVE REPAIR  2008   Left    Family History: Family History  Problem Relation Age of Onset   Heart disease Mother    Diabetes Mother    Arthritis Mother    Cancer Mother        Breast Cancer   Heart block Father    Heart disease Father    Arthritis Father    Cancer Father        skin cancer    Social History: Social History   Tobacco Use  Smoking Status Never   Passive exposure: Past  Smokeless Tobacco Never   Social History   Substance and Sexual Activity  Alcohol Use No   Social History   Substance and Sexual Activity  Drug Use No    Allergies: Allergies  Allergen Reactions   Ciprofloxacin Other (See Comments)    PANIC ATTACKS (Fluoroquinolones)    Losartan Other (See Comments)    Muscle pain, difficulty sleeping, anxiety, nausea   Metformin Other (See Comments)    Bad acid reflux with short acting (IR)   Buprenorphine Hcl Nausea Only   Morphine And Codeine Nausea Only   Penicillins Rash    Rash Has patient had a PCN reaction causing immediate rash, facial/tongue/throat swelling, SOB or lightheadedness with hypotension: Unknown Has patient had a PCN reaction causing severe rash involving mucus membranes or skin necrosis: Unknown Has patient had a PCN reaction that required hospitalization: No Has patient had a PCN reaction occurring within the last 10 years: No CHILDHOOD REACTION. If all of the above answers are "NO", then may proceed with Cephalosporin use.     Medications: Current Outpatient Medications  Medication Sig Dispense Refill   amLODipine (NORVASC) 2.5 MG tablet TAKE 1 TABLET BY MOUTH DAILY (in place of losartan) 90 tablet 3   Ascorbic Acid (VITAMIN C) 1000 MG tablet Take 1,000 mg by mouth daily.     aspirin 81 MG tablet Take 81 mg by mouth daily.     atorvastatin (LIPITOR) 20 MG tablet Take 1 tablet (20 mg total) by mouth at bedtime. 90 tablet 2   B Complex-C (SUPER B COMPLEX/VITAMIN C PO)  Take by mouth daily.     Cholecalciferol (VITAMIN D3 PO) Take 2,000 Int'l Units by mouth daily.     Cyanocobalamin (VITAMIN B12 PO) Take by mouth daily.     cyclobenzaprine (FLEXERIL) 10 MG tablet TAKE ONE-HALF TO ONE TABLET BY MOUTH 2 TIMES DAILY AS NEEDED FOR MUSCLE SPASMS 40 tablet 0   diphenhydrAMINE (BENADRYL ALLERGY) 25 MG tablet Take 1 tablet (25 mg total) by mouth 2 (two) times daily as needed for allergies.     FLUoxetine (PROZAC) 20 MG capsule Take 1 capsule (20 mg total) by mouth daily. 90 capsule 4   FLUoxetine (PROZAC) 40 MG capsule Take 1 capsule (40 mg total) by mouth daily. 90 capsule 3   fluticasone (FLONASE) 50 MCG/ACT nasal spray Place 2 sprays into both nostrils daily. 48 g 3   furosemide (LASIX) 20 MG tablet Take 1 tablet (20 mg total) by mouth daily. 90 tablet 3   Garlic 100 MG TABS Take by mouth daily.  metFORMIN (GLUCOPHAGE-XR) 500 MG 24 hr tablet Take 1 tablet (500 mg total) by mouth daily with breakfast. 90 tablet 3   Multiple Vitamins-Minerals (CERTAVITE SENIOR/ANTIOXIDANT) TABS Take 1 tablet by mouth daily.     naloxone (NARCAN) nasal spray 4 mg/0.1 mL Place 1 spray into the nose as needed for up to 365 doses (for opioid-induced respiratory depresssion). In case of emergency (overdose), spray once into each nostril. If no response within 3 minutes, repeat application and call 911. 1 each 0   naproxen (NAPROSYN) 500 MG tablet Take 1 tablet (500 mg total) by mouth 2 (two) times daily as needed for moderate pain. 90 tablet 0   Omega-3 Fatty Acids (FISH OIL) 1000 MG CAPS Take by mouth daily.     omeprazole (PRILOSEC) 20 MG capsule Take 1 capsule (20 mg total) by mouth daily. 90 capsule 3   Oxycodone HCl 10 MG TABS Take 1 tablet (10 mg total) by mouth every 8 (eight) hours as needed. Each refill must last 30 days. 90 tablet 0   [START ON 06/02/2023] Oxycodone HCl 10 MG TABS Take 1 tablet (10 mg total) by mouth every 8 (eight) hours as needed. Each refill must last 30 days.  90 tablet 0   [START ON 07/02/2023] Oxycodone HCl 10 MG TABS Take 1 tablet (10 mg total) by mouth every 8 (eight) hours as needed. Each refill must last 30 days. 90 tablet 0   polyethylene glycol (MIRALAX / GLYCOLAX) 17 g packet Take 17 g by mouth 2 (two) times daily. 180 packet 0   potassium citrate (UROCIT-K) 10 MEQ (1080 MG) SR tablet Take 1 tablet (10 mEq total) by mouth 2 (two) times daily. 180 tablet 1   psyllium (METAMUCIL) 58.6 % packet Take 1 packet by mouth 2 (two) times daily. 60 packet 2   rOPINIRole (REQUIP) 2 MG tablet Take 1 tablet (2 mg total) by mouth at bedtime. 90 tablet 3   sildenafil (REVATIO) 20 MG tablet Take 20 mg by mouth. 3 -5 tablets as needed     No current facility-administered medications for this visit.    Review of Systems: GENERAL: negative for malaise, night sweats HEENT: No changes in hearing or vision, no nose bleeds or other nasal problems. NECK: Negative for lumps, goiter, pain and significant neck swelling RESPIRATORY: Negative for cough, wheezing CARDIOVASCULAR: Negative for chest pain, leg swelling, palpitations, orthopnea GI: SEE HPI MUSCULOSKELETAL: Negative for joint pain or swelling, back pain, and muscle pain. SKIN: Negative for lesions, rash HEMATOLOGY Negative for prolonged bleeding, bruising easily, and swollen nodes. ENDOCRINE: Negative for cold or heat intolerance, polyuria, polydipsia and goiter. NEURO: negative for tremor, gait imbalance, syncope and seizures. The remainder of the review of systems is noncontributory.   Physical Exam: BP 111/74 (BP Location: Left Arm, Patient Position: Sitting, Cuff Size: Large)   Pulse 64   Temp 98 F (36.7 C) (Oral)   Ht 5\' 7"  (1.702 m)   Wt 212 lb 9.6 oz (96.4 kg)   BMI 33.30 kg/m  GENERAL: The patient is AO x3, in no acute distress. HEENT: Head is normocephalic and atraumatic. EOMI are intact. Mouth is well hydrated and without lesions. NECK: Supple. No masses LUNGS: Clear to auscultation.  No presence of rhonchi/wheezing/rales. Adequate chest expansion HEART: RRR, normal s1 and s2. ABDOMEN: Soft, nontender, no guarding, no peritoneal signs, and nondistended. BS +. No masses. EXTREMITIES: Without any cyanosis, clubbing, rash, lesions or edema. NEUROLOGIC: AOx3, no focal motor deficit. SKIN: no jaundice, no  rashes   Imaging/Labs: as above     Latest Ref Rng & Units 12/18/2022    9:03 AM 12/12/2021    9:07 AM 10/29/2020    1:42 AM  CBC  WBC 4.0 - 10.5 K/uL 7.8  6.3  14.1   Hemoglobin 13.0 - 17.0 g/dL 42.7  06.2  37.6   Hematocrit 39.0 - 52.0 % 41.8  41.4  43.7   Platelets 150.0 - 400.0 K/uL 205.0  177.0  151    Lab Results  Component Value Date   IRON 103 12/18/2022   TIBC 274.4 12/18/2022   FERRITIN 411.4 (H) 12/18/2022    I personally reviewed and interpreted the available labs, imaging and endoscopic files.  Impression and Plan: JAHNI NAZAR is a 67 y.o. male with symptomatic bradycardia s/p pacemaker, HTN who presents for evaluation of positive cologuard since 2019   #Positive cologuard  Patient had positive Cologuard since 2019 but has not had a diagnostic colonoscopy since  It is reassuring to see that he does not have any GI complaints and recent hemoglobin is normal without any anemia  Patient does not have any high risk factors for colorectal cancer malignancy.  He has been asymptomatic. Discussed cologuard  results in detail, specifically what it means when the test is positive or negative.  Discussed that there is a possibility that even when the test is positive there may not be a polyp found on colonoscopy. More than 50% of the office visit was dedicated to discussing the procedure, including the day of and risks involved. Patient understands what the procedure involves including the benefits and any risks. Patient understands alternatives to the proposed procedure. Risks including (but not limited to) bleeding, tearing of the lining (perforation),  rupture of adjacent organs, problems with heart and lung function, infection, and medication reactions. A small percentage of complications may require surgery, hospitalization, repeat endoscopic procedure, and/or transfusion. A small percentage of polyps and other tumors may not be seen.  - Schedule colonoscopy  #Constipation   Patient is on chronic opioids and likely has opioid-induced constipation  Ensure adequate fluid intake: Aim for 8 glasses of water daily. Follow a high fiber diet: Include foods such as dates, prunes, pears, and kiwi. Take Miralax twice a day for the first week, then reduce to once daily thereafter. Use Metamucil twice a day.  #BMI 33  Patient was previously on Ozempic at last 20 pounds but has stopped because unable to tolerate      - walking at a brisk pace/biking at moderate intesity 2.5-5 hours per week     - use pedometer/step counter to track activity     - goal to lose 5-10% of initial body weight     - avoid suagry drinks and juices, use zero calorie beverages     - increase water intake     - eat a low carb diet with plenty of veggies and fruit     - Get sufficient sleep 7-8 hrs nightly     - maitain active lifestyle     - avoid alcohol     - recommend 2-3 cups Coffee daily     - Counsel on lowering cholesterol by having a diet rich in vegetables,          protein (avoid red meats) and good fats(fish, salmon).  All questions were answered.      Vista Lawman, MD Gastroenterology and Hepatology [facility] Gastroenterology   This chart has been  completed using [employer] software, and while attempts have been made to ensure accuracy , certain words and phrases may not be transcribed as intended

## 2023-05-21 NOTE — Progress Notes (Signed)
Vista Lawman , M.D. Gastroenterology & Hepatology [facility]enter[facility] [address] [place], [place] [address] Primary Care Physician: [name], MD [address] [place] [address]  Chief Complaint:  Positive cologuard   History of Present Illness: Mark [name] is a 67 y.o. male with symptomatic bradycardia s/p pacemaker, HTN who presents for evaluation of positive cologuard   Patient has a positive Cologuard test since 2019.  Reports he noticed at that time he was dealing with hemorrhoids and was straining.  He did not had a follow-up colonoscopy since. Patient has a bowel movement daily but sometimes have to strain. The patient denies having any nausea, vomiting, fever, chills, hematochezia, melena, hematemesis, abdominal distention, abdominal pain, diarrhea, jaundice, pruritus or weight loss.  Last [id number]  - Normal esophagus. - Normal stomach. - Duodenal erosions with bleeding on contact. - Hyperechoic material consistent with sludge was visualized endosonographically in the common bile duct. - There was no sign of significant pathology in the pancreatic head, genu of the pancreas, pancreatic body and pancreatic tail. - No specimens collected.  - The major papilla appeared normal. - A biliary sphincterotomy was performed. - The biliary tree was swept and sludge was found.  Last Colonoscopy: 2008 in [place] Nonbleeding internal hemorrhoids suggest repeat 5 years  FHx: neg for any gastrointestinal/liver disease, no malignancies Social: neg smoking, alcohol or illicit drug use Surgical: Cholecystectomy  Past Medical History: Past Medical History:  Diagnosis Date   Anxiety    Arthritis    Chronic pain    Depression    Heart murmur    History of kidney stones 11/17/2015   Removed two kidney stones about 1 cm in size   Hydrocele in adult 05/07/2015   Hypertension    Kidney stones    Migraine    MRSA carrier  2019   Presence of permanent cardiac pacemaker    Medtronic implanted 2009 for sinus node dysfunction: generator change 2019   Prostatitis    Sleep apnea    Does not wear CPAP    Past Surgical History: Past Surgical History:  Procedure Laterality Date   CARDIOVASCULAR STRESS TEST  2014   Dr Philomena Course TUNNEL RELEASE Bilateral 2006   CERVICAL SPINE SURGERY  2008   C5/6   CHOLECYSTECTOMY N/A 10/27/2020   Procedure: LAPAROSCOPIC CHOLECYSTECTOMY;  Surgeon: [name], MD;  Location: [facility] OR;  Service: General;  Laterality: N/A;   COLONOSCOPY  07/2007   prostate nodule, hemorrhoid, rpt 5 yrs Mechele Collin)   ERCP N/A 10/26/2020   Procedure: ENDOSCOPIC RETROGRADE CHOLANGIOPANCREATOGRAPHY (ERCP);  Surgeon: Jeani Hawking, MD;  Location: [facility] ENDOSCOPY;  Service: Endoscopy;  Laterality: N/A;  choledocholithiasis   ESOPHAGOGASTRODUODENOSCOPY (EGD) WITH PROPOFOL N/A 10/26/2020   Procedure: ESOPHAGOGASTRODUODENOSCOPY (EGD) WITH PROPOFOL;  Surgeon: Jeani Hawking, MD;  Location: [facility] ENDOSCOPY;  Service: Endoscopy;  Laterality: N/A;   EUS  10/26/2020   Procedure: UPPER ENDOSCOPIC ULTRASOUND (EUS) LINEAR;  Surgeon: Jeani Hawking, MD;  Location[facility] END[facility]COPY;  Service: Endoscopy;;   IMPLANTABLE CARDIOVERTER DEFIBRILLATOR (ICD) GENERATOR CHANGE Left 12/13/2017   Procedure: PACER CHANGE OUT;  Surgeon: Marcina Millard, MD;  Location: [facility] ORS;  Service: Cardiovascular;  Laterality: Left;   LAPAROSCOPIC CHOLECYSTECTOMY  10/2020   LITHOTRIPSY Bilateral 2012   Stoioff   LITHOTRIPSY Left 11/2015   x2 Stoioff   pace maker revision  12/13/2017   PACEMAKER PLACEMENT  2009   s/p Medtronic Adapta DR Pacemaker (Dr. Lady Gary)   REMOVAL OF  STONES  10/26/2020   Procedure: REMOVAL OF STONES;  Surgeon: Jeani Hawking, MD;  Location: [facility] ENDOSCOPY;  Service: Endoscopy;;   ROTATOR CUFF REPAIR Left 2009   SPHINCTEROTOMY  10/26/2020   Procedure: SPHINCTEROTOMY;  Surgeon: Jeani Hawking, MD;  Location: [facility] ENDOSCOPY;  Service:  Endoscopy;;   TONSILLECTOMY  1964   ULNAR NERVE REPAIR  2008   Left    Family History: Family History  Problem Relation Age of Onset   Heart disease Mother    Diabetes Mother    Arthritis Mother    Cancer Mother        Breast Cancer   Heart block Father    Heart disease Father    Arthritis Father    Cancer Father        skin cancer    Social History: Social History   Tobacco Use  Smoking Status Never   Passive exposure: Past  Smokeless Tobacco Never   Social History   Substance and Sexual Activity  Alcohol Use No   Social History   Substance and Sexual Activity  Drug Use No    Allergies: Allergies  Allergen Reactions   Ciprofloxacin Other (See Comments)    PANIC ATTACKS (Fluoroquinolones)    Losartan Other (See Comments)    Muscle pain, difficulty sleeping, anxiety, nausea   Metformin Other (See Comments)    Bad acid reflux with short acting (IR)   Buprenorphine Hcl Nausea Only   Morphine And Codeine Nausea Only   Penicillins Rash    Rash Has patient had a PCN reaction causing immediate rash, facial/tongue/throat swelling, SOB or lightheadedness with hypotension: Unknown Has patient had a PCN reaction causing severe rash involving mucus membranes or skin necrosis: Unknown Has patient had a PCN reaction that required hospitalization: No Has patient had a PCN reaction occurring within the last 10 years: No CHILDHOOD REACTION. If all of the above answers are "NO", then may proceed with Cephalosporin use.     Medications: Current Outpatient Medications  Medication Sig Dispense Refill   amLODipine (NORVASC) 2.5 MG tablet TAKE 1 TABLET BY MOUTH DAILY (in place of losartan) 90 tablet 3   Ascorbic Acid (VITAMIN C) 1000 MG tablet Take 1,000 mg by mouth daily.     aspirin 81 MG tablet Take 81 mg by mouth daily.     atorvastatin (LIPITOR) 20 MG tablet Take 1 tablet (20 mg total) by mouth at bedtime. 90 tablet 2   B Complex-C (SUPER B COMPLEX/VITAMIN C PO)  Take by mouth daily.     Cholecalciferol (VITAMIN D3 PO) Take 2,000 Int'l Units by mouth daily.     Cyanocobalamin (VITAMIN B12 PO) Take by mouth daily.     cyclobenzaprine (FLEXERIL) 10 MG tablet TAKE ONE-HALF TO ONE TABLET BY MOUTH 2 TIMES DAILY AS NEEDED FOR MUSCLE SPASMS 40 tablet 0   diphenhydrAMINE (BENADRYL ALLERGY) 25 MG tablet Take 1 tablet (25 mg total) by mouth 2 (two) times daily as needed for allergies.     FLUoxetine (PROZAC) 20 MG capsule Take 1 capsule (20 mg total) by mouth daily. 90 capsule 4   FLUoxetine (PROZAC) 40 MG capsule Take 1 capsule (40 mg total) by mouth daily. 90 capsule 3   fluticasone (FLONASE) 50 MCG/ACT nasal spray Place 2 sprays into both nostrils daily. 48 g 3   furosemide (LASIX) 20 MG tablet Take 1 tablet (20 mg total) by mouth daily. 90 tablet 3   Garlic 100 MG TABS Take by mouth daily.  metFORMIN (GLUCOPHAGE-XR) 500 MG 24 hr tablet Take 1 tablet (500 mg total) by mouth daily with breakfast. 90 tablet 3   Multiple Vitamins-Minerals (CERTAVITE SENIOR/ANTIOXIDANT) TABS Take 1 tablet by mouth daily.     naloxone (NARCAN) nasal spray 4 mg/0.1 mL Place 1 spray into the nose as needed for up to 365 doses (for opioid-induced respiratory depresssion). In case of emergency (overdose), spray once into each nostril. If no response within 3 minutes, repeat application and call 911. 1 each 0   naproxen (NAPROSYN) 500 MG tablet Take 1 tablet (500 mg total) by mouth 2 (two) times daily as needed for moderate pain. 90 tablet 0   Omega-3 Fatty Acids (FISH OIL) 1000 MG CAPS Take by mouth daily.     omeprazole (PRILOSEC) 20 MG capsule Take 1 capsule (20 mg total) by mouth daily. 90 capsule 3   Oxycodone HCl 10 MG TABS Take 1 tablet (10 mg total) by mouth every 8 (eight) hours as needed. Each refill must last 30 days. 90 tablet 0   [START ON 06/02/2023] Oxycodone HCl 10 MG TABS Take 1 tablet (10 mg total) by mouth every 8 (eight) hours as needed. Each refill must last 30 days.  90 tablet 0   [START ON 07/02/2023] Oxycodone HCl 10 MG TABS Take 1 tablet (10 mg total) by mouth every 8 (eight) hours as needed. Each refill must last 30 days. 90 tablet 0   polyethylene glycol (MIRALAX / GLYCOLAX) 17 g packet Take 17 g by mouth 2 (two) times daily. 180 packet 0   potassium citrate (UROCIT-K) 10 MEQ (1080 MG) SR tablet Take 1 tablet (10 mEq total) by mouth 2 (two) times daily. 180 tablet 1   psyllium (METAMUCIL) 58.6 % packet Take 1 packet by mouth 2 (two) times daily. 60 packet 2   rOPINIRole (REQUIP) 2 MG tablet Take 1 tablet (2 mg total) by mouth at bedtime. 90 tablet 3   sildenafil (REVATIO) 20 MG tablet Take 20 mg by mouth. 3 -5 tablets as needed     No current facility-administered medications for this visit.    Review of Systems: GENERAL: negative for malaise, night sweats HEENT: No changes in hearing or vision, no nose bleeds or other nasal problems. NECK: Negative for lumps, goiter, pain and significant neck swelling RESPIRATORY: Negative for cough, wheezing CARDIOVASCULAR: Negative for chest pain, leg swelling, palpitations, orthopnea GI: SEE HPI MUSCULOSKELETAL: Negative for joint pain or swelling, back pain, and muscle pain. SKIN: Negative for lesions, rash HEMATOLOGY Negative for prolonged bleeding, bruising easily, and swollen nodes. ENDOCRINE: Negative for cold or heat intolerance, polyuria, polydipsia and goiter. NEURO: negative for tremor, gait imbalance, syncope and seizures. The remainder of the review of systems is noncontributory.   Physical Exam: BP 111/74 (BP Location: Left Arm, Patient Position: Sitting, Cuff Size: Large)   Pulse 64   Temp 98 F (36.7 C) (Oral)   Ht 5\' 7"  (1.702 m)   Wt 212 lb 9.6 oz (96.4 kg)   BMI 33.30 kg/m  GENERAL: The patient is AO x3, in no acute distress. HEENT: Head is normocephalic and atraumatic. EOMI are intact. Mouth is well hydrated and without lesions. NECK: Supple. No masses LUNGS: Clear to auscultation.  No presence of rhonchi/wheezing/rales. Adequate chest expansion HEART: RRR, normal s1 and s2. ABDOMEN: Soft, nontender, no guarding, no peritoneal signs, and nondistended. BS +. No masses. EXTREMITIES: Without any cyanosis, clubbing, rash, lesions or edema. NEUROLOGIC: AOx3, no focal motor deficit. SKIN: no jaundice, no  rashes   Imaging/Labs: as above     Latest Ref Rng & Units 12/18/2022    9:03 AM 12/12/2021    9:07 AM 10/29/2020    1:42 AM  CBC  WBC 4.0 - 10.5 K/uL 7.8  6.3  14.1   Hemoglobin 13.0 - 17.0 g/dL 42.7  06.2  37.6   Hematocrit 39.0 - 52.0 % 41.8  41.4  43.7   Platelets 150.0 - 400.0 K/uL 205.0  177.0  151    Lab Results  Component Value Date   IRON 103 12/18/2022   TIBC 274.4 12/18/2022   FERRITIN 411.4 (H) 12/18/2022    I personally reviewed and interpreted the available labs, imaging and endoscopic files.  Impression and Plan: JAHNI NAZAR is a 67 y.o. male with symptomatic bradycardia s/p pacemaker, HTN who presents for evaluation of positive cologuard since 2019   #Positive cologuard  Patient had positive Cologuard since 2019 but has not had a diagnostic colonoscopy since  It is reassuring to see that he does not have any GI complaints and recent hemoglobin is normal without any anemia  Patient does not have any high risk factors for colorectal cancer malignancy.  He has been asymptomatic. Discussed cologuard  results in detail, specifically what it means when the test is positive or negative.  Discussed that there is a possibility that even when the test is positive there may not be a polyp found on colonoscopy. More than 50% of the office visit was dedicated to discussing the procedure, including the day of and risks involved. Patient understands what the procedure involves including the benefits and any risks. Patient understands alternatives to the proposed procedure. Risks including (but not limited to) bleeding, tearing of the lining (perforation),  rupture of adjacent organs, problems with heart and lung function, infection, and medication reactions. A small percentage of complications may require surgery, hospitalization, repeat endoscopic procedure, and/or transfusion. A small percentage of polyps and other tumors may not be seen.  - Schedule colonoscopy  #Constipation   Patient is on chronic opioids and likely has opioid-induced constipation  Ensure adequate fluid intake: Aim for 8 glasses of water daily. Follow a high fiber diet: Include foods such as dates, prunes, pears, and kiwi. Take Miralax twice a day for the first week, then reduce to once daily thereafter. Use Metamucil twice a day.  #BMI 33  Patient was previously on Ozempic at last 20 pounds but has stopped because unable to tolerate      - walking at a brisk pace/biking at moderate intesity 2.5-5 hours per week     - use pedometer/step counter to track activity     - goal to lose 5-10% of initial body weight     - avoid suagry drinks and juices, use zero calorie beverages     - increase water intake     - eat a low carb diet with plenty of veggies and fruit     - Get sufficient sleep 7-8 hrs nightly     - maitain active lifestyle     - avoid alcohol     - recommend 2-3 cups Coffee daily     - Counsel on lowering cholesterol by having a diet rich in vegetables,          protein (avoid red meats) and good fats(fish, salmon).  All questions were answered.      Vista Lawman, MD Gastroenterology and Hepatology [facility] Gastroenterology   This chart has been  completed using [employer] software, and while attempts have been made to ensure accuracy , certain words and phrases may not be transcribed as intended

## 2023-05-21 NOTE — Patient Instructions (Addendum)
It was very nice to meet you today, as dicussed with will plan for the following : 1) Colonoscopy  2)Ensure adequate fluid intake: Aim for 8 glasses of water daily. Follow a high fiber diet: Include foods such as dates, prunes, pears, and kiwi. Take Miralax twice a day for the first week, then reduce to once daily thereafter. Use Metamucil twice a day.

## 2023-05-24 MED ORDER — PEG 3350-KCL-NA BICARB-NACL 420 G PO SOLR: 4000.0000 mL | Freq: Once | ORAL | 0 refills | Status: AC

## 2023-05-24 NOTE — Addendum Note (Signed)
Addended by: Marlowe Shores on: 05/24/2023 09:17 AM   Modules accepted: Orders

## 2023-05-25 ENCOUNTER — Other Ambulatory Visit: Payer: Self-pay

## 2023-05-25 MED ORDER — POTASSIUM CITRATE ER 10 MEQ (1080 MG) PO TBCR: 10.0000 meq | EXTENDED_RELEASE_TABLET | Freq: Two times a day (BID) | ORAL | 1 refills | Status: DC

## 2023-05-28 ENCOUNTER — Telehealth: Payer: Self-pay | Admitting: Family Medicine

## 2023-05-28 DIAGNOSIS — F331 Major depressive disorder, recurrent, moderate: Secondary | ICD-10-CM

## 2023-05-28 MED ORDER — FLUOXETINE HCL 40 MG PO CAPS: 40.0000 mg | ORAL_CAPSULE | Freq: Every day | ORAL | 2 refills | Status: DC

## 2023-05-28 MED ORDER — FLUOXETINE HCL 20 MG PO CAPS: 20.0000 mg | ORAL_CAPSULE | Freq: Every day | ORAL | 2 refills | Status: DC

## 2023-05-28 NOTE — Telephone Encounter (Signed)
E-scribed refills.  

## 2023-05-28 NOTE — Telephone Encounter (Signed)
Prescription Request  05/28/2023  LOV: 05/01/2023  What is the name of the medication or equipment? FLUoxetine (PROZAC) 20 MG capsule  FLUoxetine (PROZAC) 40 MG capsule  Have you contacted your pharmacy to request a refill? No   Which pharmacy would you like this sent to?    [facility] - [place], [place] - [address] [place] [address] Phone: [phone] Fax: [phone]    Patient notified that their request is being sent to the clinical staff for review and that they should receive a response within 2 business days.   Please advise at [facility] [phone]

## 2023-06-08 HISTORY — PX: COLONOSCOPY: SHX174

## 2023-06-11 ENCOUNTER — Telehealth (INDEPENDENT_AMBULATORY_CARE_PROVIDER_SITE_OTHER): Payer: Self-pay | Admitting: Gastroenterology

## 2023-06-11 ENCOUNTER — Encounter (HOSPITAL_COMMUNITY)
Admission: RE | Admit: 2023-06-11 | Discharge: 2023-06-11 | Disposition: A | Discharge status: Home / Self Care | Payer: Medicare Other | Source: Ambulatory Visit | Attending: Gastroenterology | Admitting: Gastroenterology

## 2023-06-11 ENCOUNTER — Encounter (INDEPENDENT_AMBULATORY_CARE_PROVIDER_SITE_OTHER): Payer: Self-pay

## 2023-06-11 ENCOUNTER — Encounter (HOSPITAL_COMMUNITY): Payer: Self-pay

## 2023-06-11 ENCOUNTER — Other Ambulatory Visit: Payer: Self-pay

## 2023-06-11 HISTORY — DX: Type 2 diabetes mellitus without complications: E11.9

## 2023-06-11 NOTE — Telephone Encounter (Signed)
Pt left message that is he needing copy of instructions for upcoming procedure. Sent instructions via my chart. Contacted pt to make sure he received instructions. Pt confirmed that he did receive instructions.

## 2023-06-14 ENCOUNTER — Encounter (HOSPITAL_COMMUNITY): Payer: Self-pay

## 2023-06-14 ENCOUNTER — Ambulatory Visit (HOSPITAL_BASED_OUTPATIENT_CLINIC_OR_DEPARTMENT_OTHER): Payer: Medicare Other | Admitting: Certified Registered"

## 2023-06-14 ENCOUNTER — Other Ambulatory Visit: Payer: Self-pay

## 2023-06-14 ENCOUNTER — Ambulatory Visit (HOSPITAL_COMMUNITY): Payer: Medicare Other | Admitting: Certified Registered"

## 2023-06-14 ENCOUNTER — Ambulatory Visit (HOSPITAL_COMMUNITY)
Admission: RE | Admit: 2023-06-14 | Discharge: 2023-06-14 | Disposition: A | Discharge status: Home / Self Care | Payer: Medicare Other | Attending: Gastroenterology | Admitting: Gastroenterology

## 2023-06-14 ENCOUNTER — Encounter (HOSPITAL_COMMUNITY)
Admission: RE | Disposition: A | Discharge status: Home / Self Care | Payer: Self-pay | Source: Home / Self Care | Attending: Gastroenterology

## 2023-06-14 DIAGNOSIS — G4730 Sleep apnea, unspecified: Secondary | ICD-10-CM | POA: Diagnosis not present

## 2023-06-14 DIAGNOSIS — F419 Anxiety disorder, unspecified: Secondary | ICD-10-CM | POA: Diagnosis not present

## 2023-06-14 DIAGNOSIS — D123 Benign neoplasm of transverse colon: Secondary | ICD-10-CM | POA: Diagnosis not present

## 2023-06-14 DIAGNOSIS — E119 Type 2 diabetes mellitus without complications: Secondary | ICD-10-CM | POA: Diagnosis not present

## 2023-06-14 DIAGNOSIS — K5730 Diverticulosis of large intestine without perforation or abscess without bleeding: Secondary | ICD-10-CM

## 2023-06-14 DIAGNOSIS — Z950 Presence of cardiac pacemaker: Secondary | ICD-10-CM | POA: Insufficient documentation

## 2023-06-14 DIAGNOSIS — R195 Other fecal abnormalities: Secondary | ICD-10-CM

## 2023-06-14 DIAGNOSIS — F32A Depression, unspecified: Secondary | ICD-10-CM | POA: Diagnosis not present

## 2023-06-14 DIAGNOSIS — Z7984 Long term (current) use of oral hypoglycemic drugs: Secondary | ICD-10-CM | POA: Diagnosis not present

## 2023-06-14 DIAGNOSIS — D126 Benign neoplasm of colon, unspecified: Secondary | ICD-10-CM | POA: Diagnosis not present

## 2023-06-14 DIAGNOSIS — Z1211 Encounter for screening for malignant neoplasm of colon: Secondary | ICD-10-CM

## 2023-06-14 DIAGNOSIS — K648 Other hemorrhoids: Secondary | ICD-10-CM | POA: Diagnosis not present

## 2023-06-14 DIAGNOSIS — F413 Other mixed anxiety disorders: Secondary | ICD-10-CM

## 2023-06-14 DIAGNOSIS — I10 Essential (primary) hypertension: Secondary | ICD-10-CM | POA: Diagnosis not present

## 2023-06-14 DIAGNOSIS — K644 Residual hemorrhoidal skin tags: Secondary | ICD-10-CM

## 2023-06-14 DIAGNOSIS — K219 Gastro-esophageal reflux disease without esophagitis: Secondary | ICD-10-CM | POA: Insufficient documentation

## 2023-06-14 DIAGNOSIS — K635 Polyp of colon: Secondary | ICD-10-CM | POA: Diagnosis not present

## 2023-06-14 DIAGNOSIS — D125 Benign neoplasm of sigmoid colon: Secondary | ICD-10-CM

## 2023-06-14 HISTORY — PX: COLONOSCOPY WITH PROPOFOL: SHX5780

## 2023-06-14 HISTORY — PX: POLYPECTOMY: SHX5525

## 2023-06-14 LAB — GLUCOSE, CAPILLARY: Glucose-Capillary: 131 mg/dL — ABNORMAL HIGH (ref 70–99)

## 2023-06-14 LAB — HM COLONOSCOPY

## 2023-06-14 SURGERY — COLONOSCOPY WITH PROPOFOL
Anesthesia: General

## 2023-06-14 MED ORDER — PHENYLEPHRINE 80 MCG/ML (10ML) SYRINGE FOR IV PUSH (FOR BLOOD PRESSURE SUPPORT)
PREFILLED_SYRINGE | INTRAVENOUS | Status: AC
  Filled 2023-06-14: qty 10

## 2023-06-14 MED ORDER — LIDOCAINE HCL (PF) 2 % IJ SOLN
INTRAMUSCULAR | Status: DC | PRN
Start: 2023-06-14 — End: 2023-06-14
  Administered 2023-06-14: 50 mg via INTRADERMAL

## 2023-06-14 MED ORDER — PROPOFOL 500 MG/50ML IV EMUL
INTRAVENOUS | Status: DC | PRN
Start: 2023-06-14 — End: 2023-06-14
  Administered 2023-06-14: 150 ug/kg/min via INTRAVENOUS

## 2023-06-14 MED ORDER — LACTATED RINGERS IV SOLN
INTRAVENOUS | Status: DC | PRN
Start: 2023-06-14 — End: 2023-06-14

## 2023-06-14 MED ORDER — PROPOFOL 10 MG/ML IV BOLUS
INTRAVENOUS | Status: DC | PRN
Start: 2023-06-14 — End: 2023-06-14
  Administered 2023-06-14 (×4): 50 mg via INTRAVENOUS
  Administered 2023-06-14: 100 mg via INTRAVENOUS

## 2023-06-14 MED ORDER — PHENYLEPHRINE 80 MCG/ML (10ML) SYRINGE FOR IV PUSH (FOR BLOOD PRESSURE SUPPORT)
PREFILLED_SYRINGE | INTRAVENOUS | Status: DC | PRN
  Administered 2023-06-14: 160 ug via INTRAVENOUS

## 2023-06-14 NOTE — Interval H&P Note (Signed)
History and Physical Interval Note:  06/14/2023 10:15 AM  Mark [name]  has presented today for surgery, with the diagnosis of POSITIVE COLOGUARD.  The various methods of treatment have been discussed with the patient and family. After consideration of risks, benefits and other options for treatment, the patient has consented to  Procedure(s) with comments: COLONOSCOPY WITH PROPOFOL (N/A) - 11:00AM;ASA 1-2 as a surgical intervention.  The patient's history has been reviewed, patient examined, no change in status, stable for surgery.  I have reviewed the patient's chart and labs.  Questions were answered to the patient's satisfaction.     Juanetta Beets Jaquise Faux

## 2023-06-14 NOTE — Discharge Instructions (Signed)

## 2023-06-14 NOTE — Anesthesia Procedure Notes (Addendum)
Date/Time: 06/14/2023 10:25 AM  Performed by: Julian Reil, CRNAPre-anesthesia Checklist: Patient identified, Emergency Drugs available, Suction available and Patient being monitored Patient Re-evaluated:Patient Re-evaluated prior to induction Oxygen Delivery Method: Simple face mask Induction Type: IV induction Placement Confirmation: positive ETCO2

## 2023-06-14 NOTE — Transfer of Care (Signed)
Immediate Anesthesia Transfer of Care Note  Patient: Mark [name]  Procedure(s) Performed: COLONOSCOPY WITH PROPOFOL POLYPECTOMY  Patient Location: Short Stay  Anesthesia Type:General  Level of Consciousness: awake  Airway & Oxygen Therapy: Patient Spontanous Breathing  Post-op Assessment: Report given to RN and Post -op Vital signs reviewed and stable  Post vital signs: Reviewed and stable  Last Vitals:  Vitals Value Taken Time  BP 126/60 06/14/23 1055  Temp 37.1 C 06/14/23 1055  Pulse 61 06/14/23 1055  Resp 10 06/14/23 1055  SpO2 100 % 06/14/23 1055    Last Pain:  Vitals:   06/14/23 1055  TempSrc: Oral  PainSc: Asleep         Complications: No notable events documented.

## 2023-06-14 NOTE — Op Note (Signed)
[facility] Patient Name: Mark [name] Procedure Date: 06/14/2023 10:20 AM MRN: [id number] Date of Birth: 09/08/1955 Attending MD: Sanjuan Dame , MD, [id number] CSN: [id number] Age: 67 Admit Type: Outpatient Procedure:                Colonoscopy Indications:              Positive Cologuard test Providers:                Sanjuan Dame, MD, Buel Ream. Thomasena Edis RN, RN,                            Zena Amos Referring MD:              Medicines:                Monitored Anesthesia Care Complications:            No immediate complications. Estimated Blood Loss:     Estimated blood loss was minimal. Procedure:                Pre-Anesthesia Assessment:                           - Prior to the procedure, a History and Physical                            was performed, and patient medications and                            allergies were reviewed. The patient's tolerance of                            previous anesthesia was also reviewed. The risks                            and benefits of the procedure and the sedation                            options and risks were discussed with the patient.                            All questions were answered, and informed consent                            was obtained. Prior Anticoagulants: The patient has                            taken no anticoagulant or antiplatelet agents. ASA                            Grade Assessment: III - A patient with severe                            systemic disease. After reviewing the risks and  benefits, the patient was deemed in satisfactory                            condition to undergo the procedure.                           After obtaining informed consent, the colonoscope                            was passed under direct vision. Throughout the                            procedure, the patient's blood pressure, pulse, and                            oxygen saturations were  monitored continuously. The                            [id number]) scope was introduced through the                            anus and advanced to the the terminal ileum. The                            colonoscopy was performed without difficulty. The                            patient tolerated the procedure well. The quality                            of the bowel preparation was evaluated using the                            BBPS [facility] Bowel Preparation Scale) with scores                            of: Right Colon = 2 (minor amount of residual                            staining, small fragments of stool and/or opaque                            liquid, but mucosa seen well), Transverse Colon = 2                            (minor amount of residual staining, small fragments                            of stool and/or opaque liquid, but mucosa seen                            well) and Left Colon = 2 (minor amount of residual  staining, small fragments of stool and/or opaque                            liquid, but mucosa seen well). The total BBPS score                            equals 6. The terminal ileum, ileocecal valve,                            appendiceal orifice, and rectum were photographed. Scope In: 10:29:13 AM Scope Out: 10:52:11 AM Scope Withdrawal Time: 0 hours 20 minutes 51 seconds  Total Procedure Duration: 0 hours 22 minutes 58 seconds  Findings:      The perianal and digital rectal examinations were normal.      A 5 mm polyp was found in the transverse colon. The polyp was sessile.       The polyp was removed with a cold snare. Resection and retrieval were       complete.      A few diverticula were found in the left colon.      Non-bleeding external and internal hemorrhoids were found during       retroflexion. The hemorrhoids were medium-sized.      The terminal ileum appeared normal. Impression:               - One 5 mm polyp in  the transverse colon, removed                            with a cold snare. Resected and retrieved.                           - Diverticulosis in the left colon.                           - Non-bleeding external and internal hemorrhoids.                           - The examined portion of the ileum was normal. Moderate Sedation:      Per Anesthesia Care Recommendation:           - Patient has a contact number available for                            emergencies. The signs and symptoms of potential                            delayed complications were discussed with the                            patient. Return to normal activities tomorrow.                            Written discharge instructions were provided to the                            patient.                           -  High fiber diet.                           - Continue present medications.                           - Await pathology results.                           - Repeat colonoscopy in 7-10 years for surveillance. Procedure Code(s):        --- Professional ---                           [phone], Colonoscopy, flexible; with removal of                            tumor(s), polyp(s), or other lesion(s) by snare                            technique Diagnosis Code(s):        --- Professional ---                           D12.3, Benign neoplasm of transverse colon (hepatic                            flexure or splenic flexure)                           K64.8, Other hemorrhoids                           R19.5, Other fecal abnormalities                           K57.30, Diverticulosis of large intestine without                            perforation or abscess without bleeding CPT copyright 2022 American Medical Association. All rights reserved. The codes documented in this report are preliminary and upon coder review may  be revised to meet current compliance requirements. Sanjuan Dame, MD Sanjuan Dame, MD 06/14/2023  10:57:40 AM This report has been signed electronically. Number of Addenda: 0

## 2023-06-14 NOTE — Anesthesia Preprocedure Evaluation (Signed)
Anesthesia Evaluation  Patient identified by MRN, date of birth, ID band Patient awake    Reviewed: Allergy & Precautions, H&P , NPO status , Patient's Chart, lab work & pertinent test results, reviewed documented beta blocker date and time   Airway Mallampati: II  TM Distance: >3 FB Neck ROM: full    Dental no notable dental hx.    Pulmonary neg pulmonary ROS, sleep apnea    Pulmonary exam normal breath sounds clear to auscultation       Cardiovascular Exercise Tolerance: Good hypertension, + pacemaker + Cardiac Defibrillator + Valvular Problems/Murmurs  Rhythm:regular Rate:Normal     Neuro/Psych  Headaches PSYCHIATRIC DISORDERS Anxiety Depression     Neuromuscular disease negative neurological ROS  negative psych ROS   GI/Hepatic negative GI ROS, Neg liver ROS,GERD  ,,  Endo/Other  negative endocrine ROSdiabetes    Renal/GU Renal diseasenegative Renal ROS  negative genitourinary   Musculoskeletal   Abdominal   Peds  Hematology negative hematology ROS (+) Blood dyscrasia, anemia   Anesthesia Other Findings   Reproductive/Obstetrics negative OB ROS                             Anesthesia Physical Anesthesia Plan  ASA: 3  Anesthesia Plan: General   Post-op Pain Management:    Induction:   PONV Risk Score and Plan: Propofol infusion  Airway Management Planned:   Additional Equipment:   Intra-op Plan:   Post-operative Plan:   Informed Consent: I have reviewed the patients History and Physical, chart, labs and discussed the procedure including the risks, benefits and alternatives for the proposed anesthesia with the patient or authorized representative who has indicated his/her understanding and acceptance.     Dental Advisory Given  Plan Discussed with: CRNA  Anesthesia Plan Comments:        Anesthesia Quick Evaluation

## 2023-06-15 ENCOUNTER — Encounter (INDEPENDENT_AMBULATORY_CARE_PROVIDER_SITE_OTHER): Payer: Self-pay | Admitting: *Deleted

## 2023-06-15 LAB — SURGICAL PATHOLOGY

## 2023-06-16 ENCOUNTER — Encounter: Payer: Self-pay | Admitting: Family Medicine

## 2023-06-16 NOTE — Anesthesia Postprocedure Evaluation (Signed)
Anesthesia Post Note  Patient: Mark [name]  Procedure(s) Performed: COLONOSCOPY WITH PROPOFOL POLYPECTOMY  Patient location during evaluation: Phase II Anesthesia Type: General Level of consciousness: awake Pain management: pain level controlled Vital Signs Assessment: post-procedure vital signs reviewed and stable Respiratory status: spontaneous breathing and respiratory function stable Cardiovascular status: blood pressure returned to baseline and stable Postop Assessment: no headache and no apparent nausea or vomiting Anesthetic complications: no Comments: Late entry   No notable events documented.   Last Vitals:  Vitals:   06/14/23 0920 06/14/23 1055  BP: 139/66 126/60  Pulse: 61 61  Resp: 17 10  Temp: 37.1 C 37.1 C  SpO2: 100% 100%    Last Pain:  Vitals:   06/15/23 1351  TempSrc:   PainSc: 0-No pain                 Windell Norfolk

## 2023-06-18 NOTE — Progress Notes (Signed)
I reviewed the pathology results. Ann, can you send her a letter with the findings as described below please? Repeat colonoscopy in 7 years  Thanks,  Mark Lawman, MD Gastroenterology and Hepatology [facility]  ---------------------------------------------------------------------------------------------  [facility] [address], [place], [place] [address] Phone:  [phone]   06/18/23 [name], [place]   Dear Mark [name],  I am writing to inform you that the biopsies taken during your recent endoscopic examination showed: I am writing to let you know the results of your recent colonoscopy.  You had a total of 1 polyp removed. The pathology came back as "tubular adenoma." These findings are NOT cancer, but had the polyps remained in your colon, they could have turned into cancer.  Given these findings, it is recommended that your next colonoscopy be performed in 7 years.   Please call us at [phone] if you have persistent problems or have questions about your condition that have not been fully answered at this time.  Sincerely,  Mark Lawman, MD Gastroenterology and Hepatology

## 2023-06-19 ENCOUNTER — Ambulatory Visit (INDEPENDENT_AMBULATORY_CARE_PROVIDER_SITE_OTHER): Payer: Medicare Other

## 2023-06-19 DIAGNOSIS — I495 Sick sinus syndrome: Secondary | ICD-10-CM

## 2023-06-21 ENCOUNTER — Encounter (INDEPENDENT_AMBULATORY_CARE_PROVIDER_SITE_OTHER): Payer: Self-pay | Admitting: *Deleted

## 2023-06-21 ENCOUNTER — Encounter (HOSPITAL_COMMUNITY): Payer: Self-pay | Admitting: Gastroenterology

## 2023-06-21 LAB — CUP PACEART REMOTE DEVICE CHECK
Battery Remaining Longevity: 92 mo
Battery Voltage: 2.99 V
Brady Statistic AP VP Percent: 0.33 %
Brady Statistic AP VS Percent: 99.37 %
Brady Statistic AS VP Percent: 0 %
Brady Statistic AS VS Percent: 0.3 %
Brady Statistic RA Percent Paced: 99.9 %
Brady Statistic RV Percent Paced: 0.33 %
Date Time Interrogation Session: 20241111223742
Implantable Lead Connection Status: 753985
Implantable Lead Connection Status: 753985
Implantable Lead Implant Date: 20190509
Implantable Lead Implant Date: 20190509
Implantable Lead Location: 753859
Implantable Lead Location: 753862
Implantable Lead Model: 5092
Implantable Lead Model: 5592
Implantable Pulse Generator Implant Date: 20190509
Lead Channel Impedance Value: 361 Ohm
Lead Channel Impedance Value: 380 Ohm
Lead Channel Impedance Value: 418 Ohm
Lead Channel Impedance Value: 570 Ohm
Lead Channel Pacing Threshold Amplitude: 0.875 V
Lead Channel Pacing Threshold Amplitude: 1 V
Lead Channel Pacing Threshold Pulse Width: 0.4 ms
Lead Channel Pacing Threshold Pulse Width: 0.4 ms
Lead Channel Sensing Intrinsic Amplitude: 2.5 mV
Lead Channel Sensing Intrinsic Amplitude: 2.5 mV
Lead Channel Sensing Intrinsic Amplitude: 7.125 mV
Lead Channel Sensing Intrinsic Amplitude: 7.125 mV
Lead Channel Setting Pacing Amplitude: 2 V
Lead Channel Setting Pacing Amplitude: 2 V
Lead Channel Setting Pacing Pulse Width: 0.4 ms
Lead Channel Setting Sensing Sensitivity: 2 mV
Zone Setting Status: 755011
Zone Setting Status: 755011

## 2023-07-17 NOTE — Progress Notes (Signed)
Remote pacemaker transmission.   

## 2023-07-24 NOTE — Progress Notes (Unsigned)
PROVIDER NOTE: Information contained herein reflects review and annotations entered in association with encounter. Interpretation of such information and data should be left to medically-trained personnel. Information provided to patient can be located elsewhere in the medical record under "Patient Instructions". Document created using STT-dictation technology, any transcriptional errors that may result from process are unintentional.    Patient: Mark [name]  Service Category: E/M  Provider: Oswaldo Done, MD  DOB: 05-Oct-1955  DOS: 07/25/2023  Referring Provider: [name], MD  MRN: [id number]  Specialty: Interventional Pain Management  PCP: [name], MD  Type: Established Patient  Setting: Ambulatory outpatient    Location: Office  Delivery: Face-to-face     HPI  Mark [name], a 67 y.o. year old male, is here today because of his No primary diagnosis found.. Mark [name] primary complain today is No chief complaint on file.  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) (Left); Chronic sacroiliac joint pain (Bilateral); Osteoarthritis of sacroiliac joint (Bilateral); Lumbar facet hypertrophy (L1-2, L2-3, and L4-5) (Bilateral); Lumbar IVDD (intervertebral disc displacement); 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); Lumbar facet syndrome (Bilateral); Osteoarthritis of shoulder (Left); Osteoarthritis of knee (Bilateral) (R>L); Chronic cervical radicular pain; Chronic fatigue; RLS (restless legs syndrome); Trigger finger of index finger (Right); Cervicalgia; Trigger finger of thumb (Right);  Chronic thumb pain (Left); Trigger middle finger of right hand; Chronic hand pain (Left); Osteoarthritis of first carpometacarpal joint (thumb) of hand (Left); Trigger point with back pain (Left); Trigger point of shoulder region (scapula) (Left); History of laparoscopic cholecystectomy (10/27/2020); DDD (degenerative disc disease), cervical; Chronic low back pain (Bilateral) w/o sciatica; Chronic shoulder pain (Bilateral) (L>R); Chronic shoulder pain (Left); and Chronic knee pain (Right) on their pertinent problem list. Pain Assessment: Severity of   is reported as a  /10. Location:    / . Onset:  . Quality:  . Timing:  . Modifying factor(s):  Marland Kitchen Vitals:  vitals were not taken for this visit.  BMI: Estimated body mass index is 33.29 kg/m as calculated from the following:   Height as of 06/14/23: 5\' 7"  (1.702 m).   Weight as of 06/14/23: 212 lb 8.4 oz (96.4 kg). Last encounter: 05/17/2023. Last procedure: Visit date not found.  Reason for encounter: medication management. ***  Discussed the use of AI scribe software for clinical note transcription with the patient, who gave verbal consent to proceed.  History of Present Illness         RTCB: 10/30/2023   Pharmacotherapy Assessment  Analgesic:  Oxycodone IR 10 mg, 1 tab PO TID (30 mg/day of oxycodone) MME/day: 45 mg/day.   Monitoring: [place] PMP: PDMP reviewed during this encounter.       Pharmacotherapy: No side-effects or adverse reactions reported. Compliance: No problems identified. Effectiveness: Clinically acceptable.  No notes on file  No results found for: "CBDTHCR" No results found for: "D8THCCBX" No results found for: "D9THCCBX"  UDS:  Summary  Date Value Ref Range Status  01/29/2023 Note  Final    Comment:    ==================================================================== [employer] Select 13 (MW) ==================================================================== Test  Result       Flag        Units  Drug Present   Oxycodone                      1796                    ng/mg creat   Oxymorphone                    153                     ng/mg creat   Noroxycodone                   4143                    ng/mg creat    Sources of oxycodone include scheduled prescription medications.    Oxymorphone and noroxycodone are expected metabolites of oxycodone.    Oxymorphone is also available as a scheduled prescription medication.  ==================================================================== Test                      Result    Flag   Units      Ref Range   Creatinine              68               mg/dL      >=27 ==================================================================== Declared Medications:  Medication list was not provided. ==================================================================== For clinical consultation, please call [phone]. ====================================================================       ROS  Constitutional: Denies any fever or chills Gastrointestinal: No reported hemesis, hematochezia, vomiting, or acute GI distress Musculoskeletal: Denies any acute onset joint swelling, redness, loss of ROM, or weakness Neurological: No reported episodes of acute onset apraxia, aphasia, dysarthria, agnosia, amnesia, paralysis, loss of coordination, or loss of consciousness  Medication Review  B Complex-C, CertaVite Senior/Antioxidant, Cholecalciferol, Cyanocobalamin, FLUoxetine, Fish Oil, Garlic, Oxycodone HCl, amLODipine, aspirin, atorvastatin, cyclobenzaprine, diphenhydrAMINE, fluticasone, furosemide, metFORMIN, naloxone, naproxen, omeprazole, polyethylene glycol, potassium citrate, psyllium, rOPINIRole, sildenafil, and vitamin C  History Review  Allergy: Mark [name] is allergic to ciprofloxacin, losartan, metformin, buprenorphine hcl, morphine and codeine, and penicillins. Drug: Mark [name]  reports no history of drug use. Alcohol:  reports  no history of alcohol use. Tobacco:  reports that he has never smoked. He has been exposed to tobacco smoke. He has never used smokeless tobacco. Social: Mark [name]  reports that he has never smoked. He has been exposed to tobacco smoke. He has never used smokeless tobacco. He reports that he does not drink alcohol and does not use drugs. Medical:  has a past medical history of Anxiety, Arthritis, Chronic pain, Depression, Diabetes mellitus without complication (HCC), Heart murmur, History of kidney stones (11/17/2015), Hydrocele in adult (05/07/2015), Hypertension, Kidney stones, MRSA carrier (2019), Presence of permanent cardiac pacemaker, Prostatitis, and Sleep apnea. Surgical: Mark [name]  has a past surgical history that includes Tonsillectomy (1964); Carpal tunnel release (Bilateral, 2006); Ulnar nerve repair (2008); Cervical spine surgery (2008); Rotator cuff repair (Left, 2009); pacemaker placement (2009); Lithotripsy (Bilateral, 2012); Lithotripsy (Left, 11/2015); Cardiovascular stress test (2014); Colonoscopy (07/2007); implantable cardioverter defibrillator (icd) generator change (Left, 12/13/2017); pace maker revision (12/13/2017); Laparoscopic cholecystectomy (10/2020); Cholecystectomy (N/A, 10/27/2020); ERCP (N/A, 10/26/2020); Esophagogastroduodenoscopy (egd) with propofol (N/A, 10/26/2020); EUS (10/26/2020); Sphincterotomy (10/26/2020); removal of stones (10/26/2020); Colonoscopy (06/2023); Colonoscopy with  propofol (N/A, 06/14/2023); and polypectomy (06/14/2023). Family: family history includes Arthritis in his father and mother; Cancer in his father and mother; Diabetes in his mother; Heart block in his father; Heart disease in his father and mother.  Laboratory Chemistry Profile   Renal Lab Results  Component Value Date   BUN 24 (H) 12/18/2022   CREATININE 1.02 12/18/2022   GFR 76.89 12/18/2022   GFRAA >60 12/06/2017   GFRNONAA >60 10/29/2020    Hepatic Lab Results  Component Value  Date   AST 31 12/18/2022   ALT 40 12/18/2022   ALBUMIN 3.9 12/18/2022   ALKPHOS 78 12/18/2022   HCVAB NEGATIVE 07/22/2013   LIPASE 39 10/28/2020    Electrolytes Lab Results  Component Value Date   NA 138 12/18/2022   K 3.9 12/18/2022   CL 100 12/18/2022   CALCIUM 9.1 12/18/2022   MG 2.1 09/01/2015    Bone Lab Results  Component Value Date   25OHVITD1 63 02/02/2016   25OHVITD2 <1.0 02/02/2016   25OHVITD3 63 02/02/2016   TESTOSTERONE 495 08/08/2017    Inflammation (CRP: Acute Phase) (ESR: Chronic Phase) Lab Results  Component Value Date   CRP 1.7 (H) 09/01/2015   ESRSEDRATE 6 09/01/2015         Note: Above Lab results reviewed.  Recent Imaging Review  CUP PACEART REMOTE DEVICE CHECK Scheduled remote reviewed. Normal device function.   2 VT-NS, 8 beats, V>A, 200-210 bpm.  Next remote 91 days. MC, CVRS Note: Reviewed        Physical Exam  General appearance: Well nourished, well developed, and well hydrated. In no apparent acute distress Mental status: Alert, oriented x 3 (person, place, & time)       Respiratory: No evidence of acute respiratory distress Eyes: PERLA Vitals: There were no vitals taken for this visit. BMI: Estimated body mass index is 33.29 kg/m as calculated from the following:   Height as of 06/14/23: 5\' 7"  (1.702 m).   Weight as of 06/14/23: 212 lb 8.4 oz (96.4 kg). Ideal: Patient weight not recorded  Assessment   Diagnosis Status  1. Cervicalgia   2. Chronic neck pain (1ry area of Pain) (Bilateral) (L>R)   3. Chronic sacroiliac joint pain (Bilateral)   4. Cervicogenic headache   5. 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 Lumbar facet syndrome (Bilateral)   7. Chronic shoulder pain (Bilateral) (L>R)   8. Chronic cervical radicular pain   9. Chronic low back pain (Bilateral) w/o sciatica   10. Chronic pain syndrome   11. Pharmacologic therapy   12. Chronic use of opiate for therapeutic purpose   13. Encounter for  medication management   14. Encounter for chronic pain management    Controlled Controlled Controlled   Updated Problems: No problems updated.  Plan of Care  Problem-specific:  Assessment and Plan            Mark [name] has a current medication list which includes the following long-term medication(s): amlodipine, atorvastatin, diphenhydramine, fluoxetine, fluoxetine, fluticasone, furosemide, metformin, omeprazole, oxycodone hcl, and ropinirole.  Pharmacotherapy (Medications Ordered): No orders of the defined types were placed in this encounter.  Orders:  No orders of the defined types were placed in this encounter.  Follow-up plan:   No follow-ups on file.      Interventional Therapies  Risk  Complexity Considerations:   WNL   Planned  Pending:   Diagnostic left IA shoulder joint injection #1 vs left  suprascapular nerve block #1    Under consideration:   Diagnostic right hand digit 2 trigger finger injection Therapeutic bilateral IA Hyalgan knee injections  Diagnostic bilateral cervical facet block  Possible bilateral cervical facet RFA  Diagnostic left sided CESI  Diagnostic bilateral IA knee injection  Diagnostic bilateral genicular NB  Possible bilateral genicular nerve RFA  Diagnostic bilateral sacroiliac joint block  Possible bilateral sacroiliac joint RFA  Diagnostic bilateral IA shoulder joint injection  Diagnostic bilateral suprascapular NB  Possible bilateral suprascapular nerve RFA  Diagnostic bilateral lumbar facet block  Possible bilateral lumbar facet RFA  Diagnostic left L4-5 LESI    Completed:   Therapeutic left levator scapula, supraspinatus, and rhomboid muscle TPI/MNB x1 (03/30/2020) (100/100/85/75)  Diagnostic left C7-T1 cervical ESI x1 (06/21/2016) (90/90/70/75)  Diagnostic bilateral IA Hyalgan knee inj. x3 (06/21/2016) (left: 100/90/100/90/75) (right: 90/90/80/75)  Diagnostic/therapeutic right thumb inj. x1 (05/23/2016)  (n/a) Diagnostic/therapeutic left thumb (CMC-Trapezium) inj. x1 (03/11/2020) (100/100/75/75)    Therapeutic  Palliative (PRN) options:   Therapeutic IA Hyalgan knee injections       Recent Visits Date Type Provider Dept  05/17/23 Office Visit [name], MD [facility]  04/30/23 Office Visit [name], MD [facility]  Showing recent visits within past 90 days and meeting all other requirements Future Appointments Date Type Provider Dept  07/25/23 Appointment [name], MD [facility]  Showing future appointments within next 90 days and meeting all other requirements  I discussed the assessment and treatment plan with the patient. The patient was provided an opportunity to ask questions and all were answered. The patient agreed with the plan and demonstrated an understanding of the instructions.  Patient advised to call back or seek an in-person evaluation if the symptoms or condition worsens.  Duration of encounter: *** minutes.  Total time on encounter, as per AMA guidelines included both the face-to-face and non-face-to-face time personally spent by the physician and/or other qualified health care professional(s) on the day of the encounter (includes time in activities that require the physician or other qualified health care professional and does not include time in activities normally performed by clinical staff). Physician's time may include the following activities when performed: Preparing to see the patient (e.g., pre-charting review of records, searching for previously ordered imaging, lab work, and nerve conduction tests) Review of prior analgesic pharmacotherapies. Reviewing PMP Interpreting ordered tests (e.g., lab work, imaging, nerve conduction tests) Performing post-procedure evaluations, including interpretation of diagnostic procedures Obtaining and/or reviewing separately obtained history Performing a medically  appropriate examination and/or evaluation Counseling and educating the patient/family/caregiver Ordering medications, tests, or procedures Referring and communicating with other health care professionals (when not separately reported) Documenting clinical information in the electronic or other health record Independently interpreting results (not separately reported) and communicating results to the patient/ family/caregiver Care coordination (not separately reported)  Note by: Oswaldo Done, MD Date: 07/25/2023; Time: 12:17 PM

## 2023-07-24 NOTE — Patient Instructions (Signed)

## 2023-07-25 ENCOUNTER — Ambulatory Visit: Payer: Medicare Other | Attending: Pain Medicine | Admitting: Pain Medicine

## 2023-07-25 ENCOUNTER — Encounter: Payer: Self-pay | Admitting: Pain Medicine

## 2023-07-25 DIAGNOSIS — Z79899 Other long term (current) drug therapy: Secondary | ICD-10-CM

## 2023-07-25 DIAGNOSIS — M545 Low back pain, unspecified: Secondary | ICD-10-CM | POA: Insufficient documentation

## 2023-07-25 DIAGNOSIS — G4486 Cervicogenic headache: Secondary | ICD-10-CM

## 2023-07-25 DIAGNOSIS — Z79891 Long term (current) use of opiate analgesic: Secondary | ICD-10-CM | POA: Diagnosis not present

## 2023-07-25 DIAGNOSIS — M542 Cervicalgia: Secondary | ICD-10-CM | POA: Insufficient documentation

## 2023-07-25 DIAGNOSIS — M25511 Pain in right shoulder: Secondary | ICD-10-CM

## 2023-07-25 DIAGNOSIS — M533 Sacrococcygeal disorders, not elsewhere classified: Secondary | ICD-10-CM

## 2023-07-25 DIAGNOSIS — G8929 Other chronic pain: Secondary | ICD-10-CM | POA: Diagnosis not present

## 2023-07-25 DIAGNOSIS — M961 Postlaminectomy syndrome, not elsewhere classified: Secondary | ICD-10-CM

## 2023-07-25 DIAGNOSIS — M47816 Spondylosis without myelopathy or radiculopathy, lumbar region: Secondary | ICD-10-CM

## 2023-07-25 DIAGNOSIS — M25512 Pain in left shoulder: Secondary | ICD-10-CM

## 2023-07-25 DIAGNOSIS — M5412 Radiculopathy, cervical region: Secondary | ICD-10-CM | POA: Diagnosis not present

## 2023-07-25 DIAGNOSIS — G894 Chronic pain syndrome: Secondary | ICD-10-CM

## 2023-07-25 MED ORDER — OXYCODONE HCL 10 MG PO TABS: 10.0000 mg | ORAL_TABLET | Freq: Three times a day (TID) | ORAL | 0 refills | Status: DC | PRN

## 2023-07-25 MED ORDER — OXYCODONE HCL 10 MG PO TABS
10.0000 mg | ORAL_TABLET | Freq: Three times a day (TID) | ORAL | 0 refills | Status: DC | PRN
Start: 2023-08-31 — End: 2023-10-28

## 2023-07-25 MED ORDER — OXYCODONE HCL 10 MG PO TABS
10.0000 mg | ORAL_TABLET | Freq: Three times a day (TID) | ORAL | 0 refills | Status: DC | PRN
Start: 2023-08-01 — End: 2023-10-28

## 2023-07-25 NOTE — Progress Notes (Signed)
Nursing Pain Medication Assessment:  Safety precautions to be maintained throughout the outpatient stay will include: orient to surroundings, keep bed in low position, maintain call bell within reach at all times, provide assistance with transfer out of bed and ambulation.  Medication Inspection Compliance: Pill count conducted under aseptic conditions, in front of the patient. Neither the pills nor the bottle was removed from the patient's sight at any time. Once count was completed pills were immediately returned to the patient in their original bottle.  Medication: Oxycodone IR Pill/Patch Count:  35 of 90 pills remain Pill/Patch Appearance: Markings consistent with prescribed medication Bottle Appearance: Standard pharmacy container. Clearly labeled. Filled Date: 49 / 29 / 2024 Last Medication intake:  Today

## 2023-09-03 ENCOUNTER — Other Ambulatory Visit: Payer: Self-pay | Admitting: Family Medicine

## 2023-09-03 DIAGNOSIS — G894 Chronic pain syndrome: Secondary | ICD-10-CM

## 2023-09-07 ENCOUNTER — Ambulatory Visit: Payer: Medicare Other | Admitting: Urology

## 2023-09-14 ENCOUNTER — Ambulatory Visit: Payer: Medicare Other | Admitting: Urology

## 2023-09-15 ENCOUNTER — Other Ambulatory Visit: Payer: Self-pay | Admitting: Family Medicine

## 2023-09-17 LAB — CUP PACEART REMOTE DEVICE CHECK
Battery Remaining Longevity: 86 mo
Battery Voltage: 2.98 V
Brady Statistic AP VP Percent: 0.3 %
Brady Statistic AP VS Percent: 99.44 %
Brady Statistic AS VP Percent: 0 %
Brady Statistic AS VS Percent: 0.27 %
Brady Statistic RA Percent Paced: 99.91 %
Brady Statistic RV Percent Paced: 0.3 %
Date Time Interrogation Session: 20250210023418
Implantable Lead Connection Status: 753985
Implantable Lead Connection Status: 753985
Implantable Lead Implant Date: 20190509
Implantable Lead Implant Date: 20190509
Implantable Lead Location: 753859
Implantable Lead Location: 753862
Implantable Lead Model: 5092
Implantable Lead Model: 5592
Implantable Pulse Generator Implant Date: 20190509
Lead Channel Impedance Value: 399 Ohm
Lead Channel Impedance Value: 399 Ohm
Lead Channel Impedance Value: 589 Ohm
Lead Channel Impedance Value: 646 Ohm
Lead Channel Pacing Threshold Amplitude: 0.875 V
Lead Channel Pacing Threshold Amplitude: 1.125 V
Lead Channel Pacing Threshold Pulse Width: 0.4 ms
Lead Channel Pacing Threshold Pulse Width: 0.4 ms
Lead Channel Sensing Intrinsic Amplitude: 2.25 mV
Lead Channel Sensing Intrinsic Amplitude: 2.25 mV
Lead Channel Sensing Intrinsic Amplitude: 7.625 mV
Lead Channel Sensing Intrinsic Amplitude: 7.625 mV
Lead Channel Setting Pacing Amplitude: 2 V
Lead Channel Setting Pacing Amplitude: 2.5 V
Lead Channel Setting Pacing Pulse Width: 0.4 ms
Lead Channel Setting Sensing Sensitivity: 2 mV
Zone Setting Status: 755011
Zone Setting Status: 755011

## 2023-09-17 NOTE — Telephone Encounter (Signed)
 Rx sent on 09/03/23, #40/0 refills to [facility] mail order.  Request denied.

## 2023-09-18 ENCOUNTER — Ambulatory Visit (INDEPENDENT_AMBULATORY_CARE_PROVIDER_SITE_OTHER): Payer: Medicare Other

## 2023-09-18 DIAGNOSIS — I495 Sick sinus syndrome: Secondary | ICD-10-CM | POA: Diagnosis not present

## 2023-09-23 ENCOUNTER — Encounter: Payer: Self-pay | Admitting: Internal Medicine

## 2023-10-01 DIAGNOSIS — H5213 Myopia, bilateral: Secondary | ICD-10-CM | POA: Diagnosis not present

## 2023-10-10 ENCOUNTER — Other Ambulatory Visit: Payer: Self-pay | Admitting: Family Medicine

## 2023-10-10 DIAGNOSIS — G894 Chronic pain syndrome: Secondary | ICD-10-CM

## 2023-10-10 NOTE — Telephone Encounter (Signed)
 Naproxen Last filled:  09/04/23, #90 Last OV:  04/30/23, 4 mo DM f/u Next OV:  01/01/24, CPE

## 2023-10-11 ENCOUNTER — Ambulatory Visit
Admission: RE | Admit: 2023-10-11 | Discharge: 2023-10-11 | Disposition: A | Discharge status: Home / Self Care | Attending: Urology | Admitting: Urology

## 2023-10-11 ENCOUNTER — Other Ambulatory Visit: Payer: Self-pay | Admitting: *Deleted

## 2023-10-11 ENCOUNTER — Encounter: Payer: Self-pay | Admitting: Urology

## 2023-10-11 ENCOUNTER — Ambulatory Visit
Admission: RE | Admit: 2023-10-11 | Discharge: 2023-10-11 | Disposition: A | Discharge status: Home / Self Care | Source: Ambulatory Visit | Attending: Urology | Admitting: Urology

## 2023-10-11 ENCOUNTER — Ambulatory Visit: Payer: Self-pay | Admitting: Urology

## 2023-10-11 VITALS — BP 148/87 | HR 91 | Ht 67.0 in | Wt 215.0 lb

## 2023-10-11 DIAGNOSIS — N200 Calculus of kidney: Secondary | ICD-10-CM | POA: Insufficient documentation

## 2023-10-11 DIAGNOSIS — N2889 Other specified disorders of kidney and ureter: Secondary | ICD-10-CM | POA: Diagnosis not present

## 2023-10-11 MED ORDER — POTASSIUM CITRATE ER 10 MEQ (1080 MG) PO TBCR: 10.0000 meq | EXTENDED_RELEASE_TABLET | Freq: Two times a day (BID) | ORAL | 3 refills | Status: DC

## 2023-10-11 NOTE — Progress Notes (Signed)
 I, Mark [name], acting as a scribe for Mark Altes, MD., have documented all relevant documentation on the behalf of Mark Altes, MD, as directed by Mark Altes, MD while in the presence of Mark Altes, MD.  10/11/2023 2:37 PM   Mark [name] 13-Feb-1956 [id number]  Referring provider: [name], MD [address] [place],  [place] [address]  Chief Complaint  Patient presents with   Nephrolithiasis   Urologic history: 1. Bilateral nephrolithiasis   HPI: Mark [name] is a 68 y.o. male presents for annual follow-up.  No problems since last year's visit. Notes occasional sand-like particles in urine.  Denies flank, abdominal, or pelvic pain.  No bothersome LUTS.  Denies dysuria, gross hematuria PSA 12/18/22 stable 0.91  PSA trend   PSA  Latest Ref Rng 0.10 - 4.00 ng/mL  08/13/2019 1.38   09/07/2020 0.78   12/12/2021 0.59   12/18/2022 0.91      PMH: Past Medical History:  Diagnosis Date   Anxiety    Arthritis    Chronic pain    Depression    Diabetes mellitus without complication (HCC)    Heart murmur    History of kidney stones 11/17/2015   Removed two kidney stones about 1 cm in size   Hydrocele in adult 05/07/2015   Hypertension    Kidney stones    MRSA carrier 2019   Presence of permanent cardiac pacemaker    Medtronic implanted 2009 for sinus node dysfunction: generator change 2019   Prostatitis    Sleep apnea    Does not wear CPAP    Surgical History: Past Surgical History:  Procedure Laterality Date   CARDIOVASCULAR STRESS TEST  2014   Dr Lady Gary   CARPAL TUNNEL RELEASE Bilateral 2006   CERVICAL SPINE SURGERY  2008   C5/6   CHOLECYSTECTOMY N/A 10/27/2020   Procedure: LAPAROSCOPIC CHOLECYSTECTOMY;  Surgeon: Mark Gelinas, MD;  Location: [facility] OR;  Service: General;  Laterality: N/A;   COLONOSCOPY  07/2007   prostate nodule, hemorrhoid, rpt 5 yrs Mark [name])   COLONOSCOPY  06/2023   TA, diverticulosis, hemorrhoids, rpt 7  yrs ([facility])   COLONOSCOPY WITH PROPOFOL N/A 06/14/2023   Procedure: COLONOSCOPY WITH PROPOFOL;  Surgeon: Mark Macho, MD;  Location: [facility];  Service: Endoscopy;  Laterality: N/A;  11:00AM;ASA 1-2   ERCP N/A 10/26/2020   Procedure: ENDOSCOPIC RETROGRADE CHOLANGIOPANCREATOGRAPHY (ERCP);  Surgeon: Mark Hawking, MD;  Location: [facility] ENDOSCOPY;  Service: Endoscopy;  Laterality: N/A;  choledocholithiasis   ESOPHAGOGASTRODUODENOSCOPY (EGD) WITH PROPOFOL N/A 10/26/2020   Procedure: ESOPHAGOGASTRODUODENOSCOPY (EGD) WITH PROPOFOL;  Surgeon: Mark Hawking, MD;  Location: [facility] ENDOSCOPY;  Service: Endoscopy;  Laterality: N/A;   EUS  10/26/2020   Procedure: UPPER ENDOSCOPIC ULTRASOUND (EUS) LINEAR;  Surgeon: Mark Hawking, MD;  Location: [facility] ENDOSCOPY;  Service: Endoscopy;;   IMPLANTABLE CARDIOVERTER DEFIBRILLATOR (ICD) GENERATOR CHANGE Left 12/13/2017   Procedure: PACER CHANGE OUT;  Surgeon: Mark Millard, MD;  Location: [facility] ORS;  Service: Cardiovascular;  Laterality: Left;   LAPAROSCOPIC CHOLECYSTECTOMY  10/2020   LITHOTRIPSY Bilateral 2012   Mark [name]   LITHOTRIPSY Left 11/2015   x2 Mark [name]   pace maker revision  12/13/2017   PACEMAKER PLACEMENT  2009   s/p Medtronic Adapta DR Pacemaker (Dr. Lady Gary)   POLYPECTOMY  06/14/2023   Procedure: POLYPECTOMY;  Surgeon: Mark Macho, MD;  Location: [facility];  Service: Endoscopy;;   REMOVAL OF STONES  10/26/2020   Procedure: REMOVAL  OF STONES;  Surgeon: Mark Hawking, MD;  Location: [facility] ENDOSCOPY;  Service: Endoscopy;;   ROTATOR CUFF REPAIR Left 2009   SPHINCTEROTOMY  10/26/2020   Procedure: SPHINCTEROTOMY;  Surgeon: Mark Hawking, MD;  Location: [facility] ENDOSCOPY;  Service: Endoscopy;;   TONSILLECTOMY  1964   ULNAR NERVE REPAIR  2008   Left    Home Medications:  Allergies as of 10/11/2023       Reactions   Ciprofloxacin Other (See Comments)   PANIC ATTACKS (Fluoroquinolones)   Losartan Other (See Comments)   Muscle pain, difficulty  sleeping, anxiety, nausea   Metformin Other (See Comments)   Bad acid reflux with short acting (IR)   Buprenorphine Hcl Nausea Only   Morphine And Codeine Nausea Only   Penicillins Rash   Rash Has patient had a PCN reaction causing immediate rash, facial/tongue/throat swelling, SOB or lightheadedness with hypotension: Unknown Has patient had a PCN reaction causing severe rash involving mucus membranes or skin necrosis: Unknown Has patient had a PCN reaction that required hospitalization: No Has patient had a PCN reaction occurring within the last 10 years: No CHILDHOOD REACTION. If all of the above answers are "NO", then may proceed with Cephalosporin use.        Medication List        Accurate as of October 11, 2023  2:37 PM. If you have any questions, ask your nurse or doctor.          amLODipine 2.5 MG tablet Commonly known as: NORVASC TAKE 1 TABLET BY MOUTH DAILY (in place of losartan)   aspirin 81 MG tablet Take 81 mg by mouth daily.   atorvastatin 20 MG tablet Commonly known as: LIPITOR Take 1 tablet (20 mg total) by mouth at bedtime.   CertaVite Senior/Antioxidant Tabs Take 1 tablet by mouth daily.   cyclobenzaprine 10 MG tablet Commonly known as: FLEXERIL TAKE ONE-HALF TO ONE TABLET BY MOUTH 2 TIMES DAILY AS NEEDED FOR MUSCLE SPASMS   diphenhydrAMINE 25 MG tablet Commonly known as: Benadryl Allergy Take 1 tablet (25 mg total) by mouth 2 (two) times daily as needed for allergies.   Fish Oil 1000 MG Caps Take by mouth daily.   FLUoxetine 40 MG capsule Commonly known as: PROZAC Take 1 capsule (40 mg total) by mouth daily.   FLUoxetine 20 MG capsule Commonly known as: PROZAC Take 1 capsule (20 mg total) by mouth daily.   fluticasone 50 MCG/ACT nasal spray Commonly known as: FLONASE Place 2 sprays into both nostrils daily.   furosemide 20 MG tablet Commonly known as: LASIX Take 1 tablet (20 mg total) by mouth daily.   Garlic 100 MG Tabs Take by  mouth daily.   metFORMIN 500 MG 24 hr tablet Commonly known as: GLUCOPHAGE-XR Take 1 tablet (500 mg total) by mouth daily with breakfast.   naproxen 500 MG tablet Commonly known as: NAPROSYN TAKE 1 TABLET BY MOUTH 2 TIMES DAILY AS NEEDED FOR MODERATE PAIN   omeprazole 20 MG capsule Commonly known as: PRILOSEC Take 1 capsule (20 mg total) by mouth daily.   Oxycodone HCl 10 MG Tabs Take 1 tablet (10 mg total) by mouth every 8 (eight) hours as needed. Each refill must last 30 days.   Oxycodone HCl 10 MG Tabs Take 1 tablet (10 mg total) by mouth every 8 (eight) hours as needed. Each refill must last 30 days.   Oxycodone HCl 10 MG Tabs Take 1 tablet (10 mg total) by mouth every 8 (eight) hours as  needed. Each refill must last 30 days.   potassium citrate 10 MEQ (1080 MG) SR tablet Commonly known as: UROCIT-K Take 1 tablet (10 mEq total) by mouth 2 (two) times daily.   rOPINIRole 2 MG tablet Commonly known as: REQUIP Take 1 tablet (2 mg total) by mouth at bedtime.   sildenafil 20 MG tablet Commonly known as: REVATIO Take 20 mg by mouth. 3 -5 tablets as needed   SUPER B COMPLEX/VITAMIN C PO Take by mouth daily.   VITAMIN B12 PO Take by mouth daily.   vitamin C 1000 MG tablet Take 1,000 mg by mouth daily.   VITAMIN D3 PO Take 2,000 Int'l Units by mouth daily.        Allergies:  Allergies  Allergen Reactions   Ciprofloxacin Other (See Comments)    PANIC ATTACKS (Fluoroquinolones)    Losartan Other (See Comments)    Muscle pain, difficulty sleeping, anxiety, nausea   Metformin Other (See Comments)    Bad acid reflux with short acting (IR)   Buprenorphine Hcl Nausea Only   Morphine And Codeine Nausea Only   Penicillins Rash    Rash Has patient had a PCN reaction causing immediate rash, facial/tongue/throat swelling, SOB or lightheadedness with hypotension: Unknown Has patient had a PCN reaction causing severe rash involving mucus membranes or skin necrosis:  Unknown Has patient had a PCN reaction that required hospitalization: No Has patient had a PCN reaction occurring within the last 10 years: No CHILDHOOD REACTION. If all of the above answers are "NO", then may proceed with Cephalosporin use.     Family History: Family History  Problem Relation Age of Onset   Heart disease Mother    Diabetes Mother    Arthritis Mother    Cancer Mother        Breast Cancer   Heart block Father    Heart disease Father    Arthritis Father    Cancer Father        skin cancer    Social History:  reports that he has never smoked. He has been exposed to tobacco smoke. He has never used smokeless tobacco. He reports that he does not drink alcohol and does not use drugs.   Physical Exam: BP (!) 148/87   Pulse 91   Ht 5\' 7"  (1.702 m)   Wt 215 lb (97.5 kg)   BMI 33.67 kg/m   Constitutional:  Alert and oriented, No acute distress. HEENT: [place] AT Respiratory: Normal respiratory effort, no increased work of breathing. Psychiatric: Normal mood and affect.   Pertinent Imaging: Images from a KUB performed earlier today were personally reviewed and interpreted.  Stable bilateral renal calculi.  No calcifications seen along the expected course of the ureter   Assessment & Plan:    1.  Bilateral nephrolithiasis Asymptomatic without significant interval change Continue potassium citrate Continue annual follow-up  I have reviewed the above documentation for accuracy and completeness, and I agree with the above.   Mark Altes, MD  [facility] [address] [place], [place] [address] [phone]

## 2023-10-25 NOTE — Progress Notes (Signed)
 Remote pacemaker transmission.

## 2023-10-28 NOTE — Patient Instructions (Incomplete)
 ______________________________________________________________________    Opioid Pain Medication Update  To: All patients taking opioid pain medications. (I.e.: hydrocodone, hydromorphone, oxycodone, oxymorphone, morphine, codeine, methadone, tapentadol, tramadol, buprenorphine, fentanyl, etc.)  Re: Updated review of side effects and adverse reactions of opioid analgesics, as well as new information about long term effects of this class of medications.  Direct risks of long-term opioid therapy are not limited to opioid addiction and overdose. Potential medical risks include serious fractures, breathing problems during sleep, hyperalgesia, immunosuppression, chronic constipation, bowel obstruction, myocardial infarction, and tooth decay secondary to xerostomia.  Unpredictable adverse effects that can occur even if you take your medication correctly: Cognitive impairment, respiratory depression, and death. Most people think that if they take their medication "correctly", and "as instructed", that they will be safe. Nothing could be farther from the truth. In reality, a significant amount of recorded deaths associated with the use of opioids has occurred in individuals that had taken the medication for a long time, and were taking their medication correctly. The following are examples of how this can happen: Patient taking his/her medication for a long time, as instructed, without any side effects, is given a certain antibiotic or another unrelated medication, which in turn triggers a "Drug-to-drug interaction" leading to disorientation, cognitive impairment, impaired reflexes, respiratory depression or an untoward event leading to serious bodily harm or injury, including death.  Patient taking his/her medication for a long time, as instructed, without any side effects, develops an acute impairment of liver and/or kidney function. This will lead to a rapid inability of the body to breakdown and eliminate  their pain medication, which will result in effects similar to an "overdose", but with the same medicine and dose that they had always taken. This again may lead to disorientation, cognitive impairment, impaired reflexes, respiratory depression or an untoward event leading to serious bodily harm or injury, including death.  A similar problem will occur with patients as they grow older and their liver and kidney function begins to decrease as part of the aging process.  Background information: Historically, the original case for using long-term opioid therapy to treat chronic noncancer pain was based on safety assumptions that subsequent experience has called into question. In 1996, the American Pain Society and the American Academy of Pain Medicine issued a consensus statement supporting long-term opioid therapy. This statement acknowledged the dangers of opioid prescribing but concluded that the risk for addiction was low; respiratory depression induced by opioids was short-lived, occurred mainly in opioid-naive patients, and was antagonized by pain; tolerance was not a common problem; and efforts to control diversion should not constrain opioid prescribing. This has now proven to be wrong. Experience regarding the risks for opioid addiction, misuse, and overdose in community practice has failed to support these assumptions.  According to the Centers for Disease Control and Prevention, fatal overdoses involving opioid analgesics have increased sharply over the past decade. Currently, more than 96,700 people die from drug overdoses every year. Opioids are a factor in 7 out of every 10 overdose deaths. Deaths from drug overdose have surpassed motor vehicle accidents as the leading cause of death for individuals between the ages of 61 and 64.  Clinical data suggest that neuroendocrine dysfunction may be very common in both men and women, potentially causing hypogonadism, erectile dysfunction, infertility,  decreased libido, osteoporosis, and depression. Recent studies linked higher opioid dose to increased opioid-related mortality. Controlled observational studies reported that long-term opioid therapy may be associated with increased risk for cardiovascular events. Subsequent  meta-analysis concluded that the safety of long-term opioid therapy in elderly patients has not been proven.   Side Effects and adverse reactions: Common side effects: Drowsiness (sedation). Dizziness. Nausea and vomiting. Constipation. Physical dependence -- Dependence often manifests with withdrawal symptoms when opioids are discontinued or decreased. Tolerance -- As you take repeated doses of opioids, you require increased medication to experience the same effect of pain relief. Respiratory depression -- This can occur in healthy people, especially with higher doses. However, people with COPD, asthma or other lung conditions may be even more susceptible to fatal respiratory impairment.  Uncommon side effects: An increased sensitivity to feeling pain and extreme response to pain (hyperalgesia). Chronic use of opioids can lead to this. Delayed gastric emptying (the process by which the contents of your stomach are moved into your small intestine). Muscle rigidity. Immune system and hormonal dysfunction. Quick, involuntary muscle jerks (myoclonus). Arrhythmia. Itchy skin (pruritus). Dry mouth (xerostomia).  Long-term side effects: Chronic constipation. Sleep-disordered breathing (SDB). Increased risk of bone fractures. Hypothalamic-pituitary-adrenal dysregulation. Increased risk of overdose.  RISKS: Respiratory depression and death: Opioids increase the risk of respiratory depression and death.  Drug-to-drug interactions: Opioids are relatively contraindicated in combination with benzodiazepines, sleep inducers, and other central nervous system depressants. Other classes of medications (i.e.: certain antibiotics  and even over-the-counter medications) may also trigger or induce respiratory depression in some patients.  Medical conditions: Patients with pre-existing respiratory problems are at higher risk of respiratory failure and/or depression when in combination with opioid analgesics. Opioids are relatively contraindicated in some medical conditions such as central sleep apnea.   Fractures and Falls:  Opioids increase the risk and incidence of falls. This is of particular importance in elderly patients.  Endocrine System:  Long-term administration is associated with endocrine abnormalities (endocrinopathies). (Also known as Opioid-induced Endocrinopathy) Influences on both the hypothalamic-pituitary-adrenal axis?and the hypothalamic-pituitary-gonadal axis have been demonstrated with consequent hypogonadism and adrenal insufficiency in both sexes. Hypogonadism and decreased levels of dehydroepiandrosterone sulfate have been reported in men and women. Endocrine effects include: Amenorrhoea in women (abnormal absence of menstruation) Reduced libido in both sexes Decreased sexual function Erectile dysfunction in men Hypogonadisms (decreased testicular function with shrinkage of testicles) Infertility Depression and fatigue Loss of muscle mass Anxiety Depression Immune suppression Hyperalgesia Weight gain Anemia Osteoporosis Patients (particularly women of childbearing age) should avoid opioids. There is insufficient evidence to recommend routine monitoring of asymptomatic patients taking opioids in the long-term for hormonal deficiencies.  Immune System: Human studies have demonstrated that opioids have an immunomodulating effect. These effects are mediated via opioid receptors both on immune effector cells and in the central nervous system. Opioids have been demonstrated to have adverse effects on antimicrobial response and anti-tumour surveillance. Buprenorphine has been demonstrated to have  no impact on immune function.  Opioid Induced Hyperalgesia: Human studies have demonstrated that prolonged use of opioids can lead to a state of abnormal pain sensitivity, sometimes called opioid induced hyperalgesia (OIH). Opioid induced hyperalgesia is not usually seen in the absence of tolerance to opioid analgesia. Clinically, hyperalgesia may be diagnosed if the patient on long-term opioid therapy presents with increased pain. This might be qualitatively and anatomically distinct from pain related to disease progression or to breakthrough pain resulting from development of opioid tolerance. Pain associated with hyperalgesia tends to be more diffuse than the pre-existing pain and less defined in quality. Management of opioid induced hyperalgesia requires opioid dose reduction.  Cancer: Chronic opioid therapy has been associated with an increased risk  of cancer among noncancer patients with chronic pain. This association was more evident in chronic strong opioid users. Chronic opioid consumption causes significant pathological changes in the small intestine and colon. Epidemiological studies have found that there is a link between opium dependence and initiation of gastrointestinal cancers. Cancer is the second leading cause of death after cardiovascular disease. Chronic use of opioids can cause multiple conditions such as GERD, immunosuppression and renal damage as well as carcinogenic effects, which are associated with the incidence of cancers.   Mortality: Long-term opioid use has been associated with increased mortality among patients with chronic non-cancer pain (CNCP).  Prescription of long-acting opioids for chronic noncancer pain was associated with a significantly increased risk of all-cause mortality, including deaths from causes other than overdose.  Reference: Von Korff M, Kolodny A, Deyo RA, Chou R. Long-term opioid therapy reconsidered. Ann Intern Med. 2011 Sep 6;155(5):325-8. doi:  10.7326/[id number]. PMID: 16109604; PMCID: [id number]. Randon Goldsmith, Hayward RA, Dunn KM, [place] KP. Risk of adverse events in patients prescribed long-term opioids: A cohort study in the [place] Clinical Practice Research Datalink. Eur J Pain. 2019 May;23(5):908-922. doi: 10.1002/ejp.1357. Epub 2019 Jan 31. PMID: 78295621. Colameco S, Coren JS, Ciervo CA. Continuous opioid treatment for chronic noncancer pain: a time for moderation in prescribing. Postgrad Med. 2009 Jul;121(4):61-6. doi: 10.3810/pgm.2009.07.2032. PMID: 30865784. William Hamburger RN, [place] SD, Blazina I, Cristopher Peru, Bougatsos C, Deyo RA. The effectiveness and risks of long-term opioid therapy for chronic pain: a systematic review for a [employer] of [employer] Pathways to [employer]. Ann Intern Med. 2015 Feb 17;162(4):276-86. doi: 10.7326/M14-2559. PMID: 69629528. Caryl Bis [facility], Makuc DM. NCHS Data Brief No. 22. [place]: Centers for Disease Control and Prevention; 2009. Sep, Increase in Fatal Poisonings Involving Opioid Analgesics in the [place], 1999-2006. Song IA, Choi HR, Oh TK. Long-term opioid use and mortality in patients with chronic non-cancer pain: Ten-year follow-up study in [place] from 2010 through 2019. EClinicalMedicine. 2022 Jul 18;51:101558. doi: 10.1016/j.eclinm.2022.413244. PMID: 01027253; PMCID: [id number]. Huser, W., Schubert, T., Vogelmann, T. et al. All-cause mortality in patients with long-term opioid therapy compared with non-opioid analgesics for chronic non-cancer pain: a database study. BMC Med 18, 162 (2020). http://lester.info/ Rashidian H, Karie Kirks, Malekzadeh R, Haghdoost AA. An Ecological Study of the Association between Opiate Use and Incidence of Cancers. Addict Health. 2016 Fall;8(4):252-260. PMID: 59563875; PMCID: [id number].  Our Goal: Our goal is to control your pain with means other  than the use of opioid pain medications.  Our Recommendation: Talk to your physician about coming off of these medications. We can assist you with the tapering down and stopping these medicines. Based on the new information, even if you cannot completely stop the medication, a decrease in the dose may be associated with a lesser risk. Ask for other means of controlling the pain. Decrease or eliminate those factors that significantly contribute to your pain such as smoking, obesity, and a diet heavily tilted towards "inflammatory" nutrients.  Last Updated: 02/12/2023   ______________________________________________________________________       ______________________________________________________________________    National Pain Medication Shortage  The [place] is experiencing worsening drug shortages. These have had a negative widespread effect on patient care and treatment. Not expected to improve any time soon. Predicted to last past 2029.   Drug shortage list (generic names) Oxycodone IR Oxycodone/APAP Oxymorphone IR Hydromorphone Hydrocodone/APAP Morphine  Where is the problem?  Manufacturing and supply level.  Will this shortage  affect you?  Only if you take any of the above pain medications.  How? You may be unable to fill your prescription.  Your pharmacist may offer a "partial fill" of your prescription. (Warning: Do not accept partial fills.) Prescriptions partially filled cannot be transferred to another pharmacy. Read our Medication Rules and Regulation. Depending on how much medicine you are dependent on, you may experience withdrawals when unable to get the medication.  Recommendations: Consider ending your dependence on opioid pain medications. Ask your pain specialist to assist you with the process. Consider switching to a medication currently not in shortage, such as Buprenorphine. Talk to your pain specialist about this option. Consider decreasing your pain  medication requirements by managing tolerance thru "Drug Holidays". This may help minimize withdrawals, should you run out of medicine. Control your pain thru the use of non-pharmacological interventional therapies.   Your prescriber: Prescribers cannot be blamed for shortages. Medication manufacturing and supply issues cannot be fixed by the prescriber.   NOTE: The prescriber is not responsible for supplying the medication, or solving supply issues. Work with your pharmacist to solve it. The patient is responsible for the decision to take or continue taking the medication and for identifying and securing a legal supply source. By law, supplying the medication is the job and responsibility of the pharmacy. The prescriber is responsible for the evaluation, monitoring, and prescribing of these medications.   Prescribers will NOT: Re-issue prescriptions that have been partially filled. Re-issue prescriptions already sent to a pharmacy.  Re-send prescriptions to a different pharmacy because yours did not have your medication. Ask pharmacist to order more medicine or transfer the prescription to another pharmacy. (Read below.)  New 2023 regulation: "April 07, 2022 Revised Regulation Allows DEA-Registered Pharmacies to Transfer Electronic Prescriptions at a Patient's Request DEA Headquarters Division - Public Information Office Patients now have the ability to request their electronic prescription be transferred to another pharmacy without having to go back to their practitioner to initiate the request. This revised regulation went into effect on Monday, April 03, 2022.     At a patient's request, a DEA-registered retail pharmacy can now transfer an electronic prescription for a controlled substance (schedules II-V) to another DEA-registered retail pharmacy. Prior to this change, patients would have to go through their practitioner to cancel their prescription and have it re-issued to a different  pharmacy. The process was taxing and time consuming for both patients and practitioners.    The Drug Enforcement Administration [facility]) published its intent to revise the process for transferring electronic prescriptions on June 25, 2020.  The final rule was published in the federal register on March 02, 2022 and went into effect 30 days later.  Under the final rule, a prescription can only be transferred once between pharmacies, and only if allowed under existing state or other applicable law. The prescription must remain in its electronic form; may not be altered in any way; and the transfer must be communicated directly between two licensed pharmacists. It's important to note, any authorized refills transfer with the original prescription, which means the entire prescription will be filled at the same pharmacy".  Reference: HugeHand.is [facility] website announcement)  [email or web address] [employer] of [employer])   [employer] / Vol. 88, No. 143 / Thursday, March 02, 2022 / Rules and Regulations DEPARTMENT OF JUSTICE  Drug Enforcement Administration  21 CFR Part 1306  [Docket No. [id number]]  RIN [id number] Transfer of Electronic Prescriptions for Schedules II-V Controlled Substances Between  Pharmacies for Initial Filling  ______________________________________________________________________       ______________________________________________________________________    Transfer of Pain Medication between Pharmacies  Re: 2023 DEA Clarification on existing regulation  Published on DEA Website: April 07, 2022  Title: Revised Regulation Allows DEA-Registered Pharmacies to Electrical engineer Prescriptions at a Patient's Request DEA Headquarters Division - [employer]  "Patients now have the ability to  request their electronic prescription be transferred to another pharmacy without having to go back to their practitioner to initiate the request. This revised regulation went into effect on Monday, April 03, 2022.     At a patient's request, a DEA-registered retail pharmacy can now transfer an electronic prescription for a controlled substance (schedules II-V) to another DEA-registered retail pharmacy. Prior to this change, patients would have to go through their practitioner to cancel their prescription and have it re-issued to a different pharmacy. The process was taxing and time consuming for both patients and practitioners.    The Drug Enforcement Administration [facility]) published its intent to revise the process for transferring electronic prescriptions on June 25, 2020.  The final rule was published in the federal register on March 02, 2022 and went into effect 30 days later.  Under the final rule, a prescription can only be transferred once between pharmacies, and only if allowed under existing state or other applicable law. The prescription must remain in its electronic form; may not be altered in any way; and the transfer must be communicated directly between two licensed pharmacists. It's important to note, any authorized refills transfer with the original prescription, which means the entire prescription will be filled at the same pharmacy."    REFERENCES: 1. DEA website announcement [email or web address]  2. Department of Justice website  [email or web address].pdf  3. DEPARTMENT OF JUSTICE Drug Enforcement Administration 21 CFR Part 1306 [Docket No. [id number]] RIN [id number] "Transfer of Electronic Prescriptions for Schedules II-V Controlled Substances Between Pharmacies for Initial  Filling"  ______________________________________________________________________       ______________________________________________________________________    Medication Rules  Purpose: To inform patients, and their family members, of our medication rules and regulations.  Applies to: All patients receiving prescriptions from our practice (written or electronic).  Pharmacy of record: This is the pharmacy where your electronic prescriptions will be sent. Make sure we have the correct one.  Electronic prescriptions: In compliance with the [facility] Strengthen Opioid Misuse Prevention (STOP) Act of 2017 (Session Conni Elliot [phone]), effective August 07, 2018, all controlled substances must be electronically prescribed. Written prescriptions, faxing, or calling prescriptions to a pharmacy will no longer be done.  Prescription refills: These will be provided only during in-person appointments. No medications will be renewed without a "face-to-face" evaluation with your provider. Applies to all prescriptions.  NOTE: The following applies primarily to controlled substances (Opioid* Pain Medications).   Type of encounter (visit): For patients receiving controlled substances, face-to-face visits are required. (Not an option and not up to the patient.)  Patient's Responsibilities: Pain Pills: Bring all pain pills to every appointment (except for procedure appointments). Pill counts are required.  Pill Bottles: Bring pills in original pharmacy bottle. Bring bottle, even if empty. Always bring the bottle of the most recent fill.  Medication refills: You are responsible for knowing and keeping track of what medications you are taking and when is it that you will need a refill. The day before your appointment: write a list of all prescriptions that need to be refilled. The day of the appointment: give the list to  the admitting nurse. Prescriptions will be written only during appointments. No  prescriptions will be written on procedure days. If you forget a medication: it will not be "Called in", "Faxed", or "electronically sent". You will need to get another appointment to get these prescribed. No early refills. Do not call asking to have your prescription filled early. Partial  or short prescriptions: Occasionally your pharmacy may not have enough pills to fill your prescription.  NEVER ACCEPT a partial fill or a prescription that is short of the total amount of pills that you were prescribed.  With controlled substances the law allows 72 hours for the pharmacy to complete the prescription.  If the prescription is not completed within 72 hours, the pharmacist will require a new prescription to be written. This means that you will be short on your medicine and we WILL NOT send another prescription to complete your original prescription.  Instead, request the pharmacy to send a carrier to a nearby branch to get enough medication to provide you with your full prescription. Prescription Accuracy: You are responsible for carefully inspecting your prescriptions before leaving our office. Have the discharge nurse carefully go over each prescription with you, before taking them home. Make sure that your name is accurately spelled, that your address is correct. Check the name and dose of your medication to make sure it is accurate. Check the number of pills, and the written instructions to make sure they are clear and accurate. Make sure that you are given enough medication to last until your next medication refill appointment. Taking Medication: Take medication as prescribed. When it comes to controlled substances, taking less pills or less frequently than prescribed is permitted and encouraged. Never take more pills than instructed. Never take the medication more frequently than prescribed.  Inform other Doctors: Always inform, all of your healthcare providers, of all the medications you take. Pain  Medication from other Providers: You are not allowed to accept any additional pain medication from any other Doctor or Healthcare provider. There are two exceptions to this rule. (see below) In the event that you require additional pain medication, you are responsible for notifying us, as stated below. Cough Medicine: Often these contain an opioid, such as codeine or hydrocodone. Never accept or take cough medicine containing these opioids if you are already taking an opioid* medication. The combination may cause respiratory failure and death. Medication Agreement: You are responsible for carefully reading and following our Medication Agreement. This must be signed before receiving any prescriptions from our practice. Safely store a copy of your signed Agreement. Violations to the Agreement will result in no further prescriptions. (Additional copies of our Medication Agreement are available upon request.) Laws, Rules, & Regulations: All patients are expected to follow all [address] and [employer], [employer], Rules, [place] [employer]. Ignorance of the Laws does not constitute a valid excuse.  Illegal drugs and Controlled Substances: The use of illegal substances (including, but not limited to marijuana and its derivatives) and/or the illegal use of any controlled substances is strictly prohibited. Violation of this rule may result in the immediate and permanent discontinuation of any and all prescriptions being written by our practice. The use of any illegal substances is prohibited. Adopted CDC guidelines & recommendations: Target dosing levels will be at or below 60 MME/day. Use of benzodiazepines** is not recommended. Urine Drug testing: Patients taking controlled substances will be required to provide a urine sample upon request. Do not void before coming to your medication management appointments.  Hold emptying your bladder until you are admitted. The admitting nurse will inform you if a sample is required. Our  practice reserves the right to call you at any time to provide a sample. Once receiving the call, you have 24 hours to comply with request. Not providing a sample upon request may result in termination of medication therapy.  Exceptions: There are only two exceptions to the rule of not receiving pain medications from other Healthcare Providers. Exception #1 (Emergencies): In the event of an emergency (i.e.: accident requiring emergency care), you are allowed to receive additional pain medication. However, you are responsible for: As soon as you are able, call our office [phone], at any time of the day or night, and leave a message stating your name, the date and nature of the emergency, and the name and dose of the medication prescribed. In the event that your call is answered by a member of our staff, make sure to document and save the date, time, and the name of the person that took your information.  Exception #2 (Planned Surgery): In the event that you are scheduled by another doctor or dentist to have any type of surgery or procedure, you are allowed (for a period no longer than 30 days), to receive additional pain medication, for the acute post-op pain. However, in this case, you are responsible for picking up a copy of our "Post-op Pain Management for Surgeons" handout, and giving it to your surgeon or dentist. This document is available at our office, and does not require an appointment to obtain it. Simply go to our office during business hours (Monday-Thursday from 8:00 AM to 4:00 PM) (Friday 8:00 AM to 12:00 Noon) or if you have a scheduled appointment with [place], prior to your surgery, and ask for it by name. In addition, you are responsible for: calling our office [phone], at any time of the day or night, and leaving a message stating your name, name of your surgeon, type of surgery, and date of procedure or surgery. Failure to comply with your responsibilities may result in termination  of therapy involving the controlled substances.  Consequences:  Non-compliance with the above rules may result in permanent discontinuation of medication prescription therapy. All patients receiving any type of controlled substance is expected to comply with the above patient responsibilities. Not doing so may result in permanent discontinuation of medication prescription therapy. Medication Agreement Violation. Following the above rules, including your responsibilities will help you in avoiding a Medication Agreement Violation ("Breaking your Pain Medication Contract").  *Opioid medications include: morphine, codeine, oxycodone, oxymorphone, hydrocodone, hydromorphone, meperidine, tramadol, tapentadol, buprenorphine, fentanyl, methadone. **Benzodiazepine medications include: diazepam (Valium), alprazolam (Xanax), clonazepam (Klonopine), lorazepam (Ativan), clorazepate (Tranxene), chlordiazepoxide (Librium), estazolam (Prosom), oxazepam (Serax), temazepam (Restoril), triazolam (Halcion) (Last updated: 05/30/2023) ______________________________________________________________________      ______________________________________________________________________    Medication Recommendations and Reminders  Applies to: All patients receiving prescriptions (written and/or electronic).  Medication Rules & Regulations: You are responsible for reading, knowing, and following our "Medication Rules" document. These exist for your safety and that of others. They are not flexible and neither are we. Dismissing or ignoring them is an act of "non-compliance" that may result in complete and irreversible termination of such medication therapy. For safety reasons, "non-compliance" will not be tolerated. As with the [place] fundamental legal principle of "ignorance of the law is no defense", we will accept no excuses for not having read and knowing the content of documents provided to you by  our practice.  Pharmacy of  record:  Definition: This is the pharmacy where your electronic prescriptions will be sent.  We do not endorse any particular pharmacy. It is up to you and your insurance to decide what pharmacy to use.  We do not restrict you in your choice of pharmacy. However, once we write for your prescriptions, we will NOT be re-sending more prescriptions to fix restricted supply problems created by your pharmacy, or your insurance.  The pharmacy listed in the electronic medical record should be the one where you want electronic prescriptions to be sent. If you choose to change pharmacy, simply notify our nursing staff. Changes will be made only during your regular appointments and not over the phone.  Recommendations: Keep all of your pain medications in a safe place, under lock and key, even if you live alone. We will NOT replace lost, stolen, or damaged medication. We do not accept "Police Reports" as proof of medications having been stolen. After you fill your prescription, take 1 week's worth of pills and put them away in a safe place. You should keep a separate, properly labeled bottle for this purpose. The remainder should be kept in the original bottle. Use this as your primary supply, until it runs out. Once it's gone, then you know that you have 1 week's worth of medicine, and it is time to come in for a prescription refill. If you do this correctly, it is unlikely that you will ever run out of medicine. To make sure that the above recommendation works, it is very important that you make sure your medication refill appointments are scheduled at least 1 week before you run out of medicine. To do this in an effective manner, make sure that you do not leave the office without scheduling your next medication management appointment. Always ask the nursing staff to show you in your prescription , when your medication will be running out. Then arrange for the receptionist to get you a return appointment, at least  7 days before you run out of medicine. Do not wait until you have 1 or 2 pills left, to come in. This is very poor planning and does not take into consideration that we may need to cancel appointments due to bad weather, sickness, or emergencies affecting our staff. DO NOT ACCEPT A "Partial Fill": If for any reason your pharmacy does not have enough pills/tablets to completely fill or refill your prescription, do not allow for a "partial fill". The law allows the pharmacy to complete that prescription within 72 hours, without requiring a new prescription. If they do not fill the rest of your prescription within those 72 hours, you will need a separate prescription to fill the remaining amount, which we will NOT provide. If the reason for the partial fill is your insurance, you will need to talk to the pharmacist about payment alternatives for the remaining tablets, but again, DO NOT ACCEPT A PARTIAL FILL, unless you can trust your pharmacist to obtain the remainder of the pills within 72 hours.  Prescription refills and/or changes in medication(s):  Prescription refills, and/or changes in dose or medication, will be conducted only during scheduled medication management appointments. (Applies to both, written and electronic prescriptions.) No refills on procedure days. No medication will be changed or started on procedure days. No changes, adjustments, and/or refills will be conducted on a procedure day. Doing so will interfere with the diagnostic portion of the procedure. No phone refills. No medications will be "  called into the pharmacy". No Fax refills. No weekend refills. No Holliday refills. No after hours refills.  Remember:  Business hours are:  Monday to Thursday 8:00 AM to 4:00 PM Provider's Schedule: [name], MD - Appointments are:  Medication management: Monday and Wednesday 8:00 AM to 4:00 PM Procedure day: Tuesday and Thursday 7:30 AM to 4:00 PM Edward Jolly, MD -  Appointments are:  Medication management: Tuesday and Thursday 8:00 AM to 4:00 PM Procedure day: Monday and Wednesday 7:30 AM to 4:00 PM (Last update: 05/30/2022) ______________________________________________________________________

## 2023-10-28 NOTE — Progress Notes (Unsigned)
 PROVIDER NOTE: Information contained herein reflects review and annotations entered in association with encounter. Interpretation of such information and data should be left to medically-trained personnel. Information provided to patient can be located elsewhere in the medical record under "Patient Instructions". Document created using STT-dictation technology, any transcriptional errors that may result from process are unintentional.    Patient: Mark [name]  Service Category: E/M  Provider: Oswaldo Done, MD  DOB: 1956/03/14  DOS: 10/29/2023  Referring Provider: [name], MD  MRN: [id number]  Specialty: Interventional Pain Management  PCP: [name], MD  Type: Established Patient  Setting: Ambulatory outpatient    Location: Office  Delivery: Face-to-face     HPI  [name], a 68 y.o. year old male, is here today because of his Chronic pain syndrome [G89.4]. Mr. Mccuiston primary complain today is Shoulder Pain (Left)  Pertinent problems: Mr. 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); Cervicogenic headache; Cervical spondylosis with radiculopathy (Bilateral) (L>R); Cervical facet syndrome (Bilateral) (L>R); Cervical foraminal stenosis (Severe C6-7) (Left); Chronic sacroiliac joint pain (Bilateral); Osteoarthritis of sacroiliac joint (Bilateral); Lumbar facet hypertrophy (L1-2, L2-3, and L4-5) (Bilateral); Lumbar IVDD (intervertebral disc displacement); 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); Lumbar facet syndrome (Bilateral); Osteoarthritis of shoulder (Left); Osteoarthritis of knee (Bilateral) (R>L); Chronic cervical radicular pain; Chronic fatigue; RLS (restless legs syndrome); Trigger finger of index finger (Right); Cervicalgia; Trigger finger of thumb (Right);  Chronic thumb pain (Left); Trigger middle finger of right hand; Chronic hand pain (Left); Osteoarthritis of first carpometacarpal joint (thumb) of hand (Left); Trigger point with back pain (Left); Trigger point of shoulder region (scapula) (Left); History of laparoscopic cholecystectomy (10/27/2020); DDD (degenerative disc disease), cervical; Chronic low back pain (Bilateral) w/o sciatica; Chronic shoulder pain (Bilateral) (L>R); Chronic shoulder pain (Left); and Chronic knee pain (Right) on their pertinent problem list. Pain Assessment: Severity of Chronic pain is reported as a 4 /10. Location: Shoulder Left/Radaites from lower left neck into left shoulder. Onset: More than a month ago. Quality: Constant, Aching, Sore. Timing: Constant. Modifying factor(s): Pain Medication. Vitals:  height is 5\' 7"  (1.702 m) and weight is 215 lb (97.5 kg). His temporal temperature is 97.1 F (36.2 C) (abnormal). His blood pressure is 141/75 (abnormal) and his pulse is 70. His respiration is 16 and oxygen saturation is 100%.  BMI: Estimated body mass index is 33.67 kg/m as calculated from the following:   Height as of this encounter: 5\' 7"  (1.702 m).   Weight as of this encounter: 215 lb (97.5 kg). Last encounter: 07/25/2023. Last procedure: Visit date not found.  Reason for encounter: medication management.  The patient indicates doing well with the current medication regimen. No adverse reactions or side effects reported to the medications.   Discussed the use of AI scribe software for clinical note transcription with the patient, who gave verbal consent to proceed.  History of Present Illness   Mark [name] is a 68 year old male with arthritis who presents with pain management issues.  He experiences pain primarily in his shoulder, which worsens in damp weather conditions. The pain significantly impacts daily activities, prompting him to seek effective management strategies.  He is currently taking an  anti-inflammatory medication, previously at a dose of two 500 mg tablets per day, but has reduced this to one tablet daily due to  concerns about potential side effects on his heart and kidneys. He is not currently taking ibuprofen or similar medications and has tried a generic arthritis medication in the past.  His family history includes his 70 year old mother, who occasionally takes Tylenol for arthritis but does not do so regularly, as she claims it causes headaches.     RTCB: 01/28/2024   Pharmacotherapy Assessment  Analgesic: Oxycodone IR 10 mg, 1 tab PO TID (30 mg/day of oxycodone) MME/day: 45 mg/day.   Monitoring: [place] PMP: PDMP reviewed during this encounter.       Pharmacotherapy: No side-effects or adverse reactions reported. Compliance: No problems identified. Effectiveness: Clinically acceptable.  Earlyne Iba, RN  10/29/2023  1:44 PM  Sign when Signing Visit Nursing Pain Medication Assessment:  Safety precautions to be maintained throughout the outpatient stay will include: orient to surroundings, keep bed in low position, maintain call bell within reach at all times, provide assistance with transfer out of bed and ambulation.   Medication Inspection Compliance: Pill count conducted under aseptic conditions, in front of the patient. Neither the pills nor the bottle was removed from the patient's sight at any time. Once count was completed pills were immediately returned to the patient in their original bottle.  Medication: Oxycodone IR Pill/Patch Count:  14 of 90 pills remain Pill/Patch Appearance: Markings consistent with prescribed medication Bottle Appearance: Standard pharmacy container. Clearly labeled. Filled Date: 02 / 27 / 2025 Last Medication intake:  Today    No results found for: "CBDTHCR" No results found for: "D8THCCBX" No results found for: "D9THCCBX"  UDS:  Summary  Date Value Ref Range Status  01/29/2023 Note  Final    Comment:     ==================================================================== [employer] Select 13 (MW) ==================================================================== Test                             Result       Flag       Units  Drug Present   Oxycodone                      1796                    ng/mg creat   Oxymorphone                    153                     ng/mg creat   Noroxycodone                   4143                    ng/mg creat    Sources of oxycodone include scheduled prescription medications.    Oxymorphone and noroxycodone are expected metabolites of oxycodone.    Oxymorphone is also available as a scheduled prescription medication.  ==================================================================== Test                      Result    Flag   Units      Ref Range   Creatinine              68               mg/dL      >=74 ==================================================================== Declared Medications:  Medication list was not provided. ==================================================================== For  clinical consultation, please call [phone]. ====================================================================       ROS  Constitutional: Denies any fever or chills Gastrointestinal: No reported hemesis, hematochezia, vomiting, or acute GI distress Musculoskeletal: Denies any acute onset joint swelling, redness, loss of ROM, or weakness Neurological: No reported episodes of acute onset apraxia, aphasia, dysarthria, agnosia, amnesia, paralysis, loss of coordination, or loss of consciousness  Medication Review  B Complex-C, CertaVite Senior/Antioxidant, Cholecalciferol, Cyanocobalamin, FLUoxetine, Fish Oil, Garlic, Oxycodone HCl, amLODipine, aspirin, atorvastatin, cyclobenzaprine, diphenhydrAMINE, fluticasone, furosemide, metFORMIN, naloxone, naproxen, omeprazole, potassium citrate, rOPINIRole, sildenafil, and vitamin C  History Review   Allergy: Mr. Ederer is allergic to ciprofloxacin, losartan, metformin, buprenorphine hcl, morphine and codeine, and penicillins. Drug: Mr. Juba  reports no history of drug use. Alcohol:  reports no history of alcohol use. Tobacco:  reports that he has never smoked. He has been exposed to tobacco smoke. He has never used smokeless tobacco. Social: Mr. Gariepy  reports that he has never smoked. He has been exposed to tobacco smoke. He has never used smokeless tobacco. He reports that he does not drink alcohol and does not use drugs. Medical:  has a past medical history of Anxiety, Arthritis, Chronic pain, Depression, Diabetes mellitus without complication (HCC), Heart murmur, History of kidney stones (11/17/2015), Hydrocele in adult (05/07/2015), Hypertension, Kidney stones, MRSA carrier (2019), Presence of permanent cardiac pacemaker, Prostatitis, and Sleep apnea. Surgical: Mr. Doris  has a past surgical history that includes Tonsillectomy (1964); Carpal tunnel release (Bilateral, 2006); Ulnar nerve repair (2008); Cervical spine surgery (2008); Rotator cuff repair (Left, 2009); pacemaker placement (2009); Lithotripsy (Bilateral, 2012); Lithotripsy (Left, 11/2015); Cardiovascular stress test (2014); Colonoscopy (07/2007); implantable cardioverter defibrillator (icd) generator change (Left, 12/13/2017); pace maker revision (12/13/2017); Laparoscopic cholecystectomy (10/2020); Cholecystectomy (N/A, 10/27/2020); ERCP (N/A, 10/26/2020); Esophagogastroduodenoscopy (egd) with propofol (N/A, 10/26/2020); EUS (10/26/2020); Sphincterotomy (10/26/2020); removal of stones (10/26/2020); Colonoscopy (06/2023); Colonoscopy with propofol (N/A, 06/14/2023); and polypectomy (06/14/2023). Family: family history includes Arthritis in his father and mother; Cancer in his father and mother; Diabetes in his mother; Heart block in his father; Heart disease in his father and mother.  Laboratory Chemistry Profile   Renal Lab Results   Component Value Date   BUN 24 (H) 12/18/2022   CREATININE 1.02 12/18/2022   GFR 76.89 12/18/2022   GFRAA >60 12/06/2017   GFRNONAA >60 10/29/2020    Hepatic Lab Results  Component Value Date   AST 31 12/18/2022   ALT 40 12/18/2022   ALBUMIN 3.9 12/18/2022   ALKPHOS 78 12/18/2022   HCVAB NEGATIVE 07/22/2013   LIPASE 39 10/28/2020    Electrolytes Lab Results  Component Value Date   NA 138 12/18/2022   K 3.9 12/18/2022   CL 100 12/18/2022   CALCIUM 9.1 12/18/2022   MG 2.1 09/01/2015    Bone Lab Results  Component Value Date   25OHVITD1 63 02/02/2016   25OHVITD2 <1.0 02/02/2016   25OHVITD3 63 02/02/2016   TESTOSTERONE 495 08/08/2017    Inflammation (CRP: Acute Phase) (ESR: Chronic Phase) Lab Results  Component Value Date   CRP 1.7 (H) 09/01/2015   ESRSEDRATE 6 09/01/2015         Note: Above Lab results reviewed.  Recent Imaging Review  Abdomen 1 view (KUB) CLINICAL DATA:  Kidney stones  EXAM: ABDOMEN - 1 VIEW  COMPARISON:  September 13, 2022  FINDINGS: Comparison with prior examinations demonstrates no significant change in the size number or morphology of the bilateral renal calcifications consistent with bilateral nephrolithiasis. Loosely formed left  staghorn calculus  IMPRESSION: Bilateral nephrolithiasis.  Electronically Signed   By: Shaaron Adler M.D.   On: 10/24/2023 21:26 Note: Reviewed        Physical Exam  General appearance: Well nourished, well developed, and well hydrated. In no apparent acute distress Mental status: Alert, oriented x 3 (person, place, & time)       Respiratory: No evidence of acute respiratory distress Eyes: PERLA Vitals: BP (!) 141/75 (Patient Position: Sitting, Cuff Size: Normal)   Pulse 70   Temp (!) 97.1 F (36.2 C) (Temporal)   Resp 16   Ht 5\' 7"  (1.702 m)   Wt 215 lb (97.5 kg)   SpO2 100%   BMI 33.67 kg/m  BMI: Estimated body mass index is 33.67 kg/m as calculated from the following:   Height as of this  encounter: 5\' 7"  (1.702 m).   Weight as of this encounter: 215 lb (97.5 kg). Ideal: Ideal body weight: 66.1 kg (145 lb 11.6 oz) Adjusted ideal body weight: 78.7 kg (173 lb 6.9 oz)  Assessment   Diagnosis Status  1. Chronic pain syndrome   2. Cervicalgia   3. Chronic neck pain (1ry area of Pain) (Bilateral) (L>R)   4. Chronic sacroiliac joint pain (Bilateral)   5. Cervicogenic headache   6. 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 Lumbar facet syndrome (Bilateral)   8. Chronic shoulder pain (Bilateral) (L>R)   9. Chronic cervical radicular pain   10. Chronic low back pain (Bilateral) w/o sciatica   11. Pharmacologic therapy   12. Chronic use of opiate for therapeutic purpose   13. Encounter for medication management   14. Encounter for chronic pain management    Controlled Controlled Controlled   Updated Problems: No problems updated.  Plan of Care  Problem-specific:  Assessment and Plan    Arthritis   Chronic arthritis pain worsens in damp weather, with some relief from anti-inflammatory medication, though less effective in such conditions. He takes one 500 mg dose daily due to potential heart and kidney risks. The link between barometric pressure changes and joint inflammation was explained. Discussed alternative over-the-counter supplements like turmeric, glucosamine, and chondroitin, which may offer relief without the adverse effects of prescription anti-inflammatories. He should review and try these supplements to assess his effectiveness, monitoring for any adverse reactions and reporting back on his effectiveness.       Mr. QUANTEZ SCHNYDER has a current medication list which includes the following long-term medication(s): amlodipine, atorvastatin, diphenhydramine, fluoxetine, fluoxetine, fluticasone, furosemide, metformin, omeprazole, [START ON 10/30/2023] oxycodone hcl, [START ON 11/29/2023] oxycodone hcl, [START ON 12/29/2023] oxycodone hcl, and  ropinirole.  Pharmacotherapy (Medications Ordered): Meds ordered this encounter  Medications   Oxycodone HCl 10 MG TABS    Sig: Take 1 tablet (10 mg total) by mouth every 8 (eight) hours as needed. Each refill must last 30 days.    Dispense:  90 tablet    Refill:  0    DO NOT: delete (not duplicate); no partial-fill (will deny script to complete), no refill request (F/U required). DISPENSE: 1 day early if closed on fill date. WARN: No CNS-depressants within 8 hrs of med.   Oxycodone HCl 10 MG TABS    Sig: Take 1 tablet (10 mg total) by mouth every 8 (eight) hours as needed. Each refill must last 30 days.    Dispense:  90 tablet    Refill:  0    DO NOT: delete (not duplicate); no partial-fill (  will deny script to complete), no refill request (F/U required). DISPENSE: 1 day early if closed on fill date. WARN: No CNS-depressants within 8 hrs of med.   Oxycodone HCl 10 MG TABS    Sig: Take 1 tablet (10 mg total) by mouth every 8 (eight) hours as needed. Each refill must last 30 days.    Dispense:  90 tablet    Refill:  0    DO NOT: delete (not duplicate); no partial-fill (will deny script to complete), no refill request (F/U required). DISPENSE: 1 day early if closed on fill date. WARN: No CNS-depressants within 8 hrs of med.   naloxone (NARCAN) nasal spray 4 mg/0.1 mL    Sig: Place 1 spray into the nose as needed for up to 365 doses (for opioid-induced respiratory depresssion). In case of emergency (overdose), spray once into each nostril. If no response within 3 minutes, repeat application and call 911.    Dispense:  1 each    Refill:  1    Instruct patient in proper use of device.   Orders:  No orders of the defined types were placed in this encounter.  Follow-up plan:   Return in about 13 weeks (around 01/28/2024) for Eval-day (M,W), (F2F), (MM).      Interventional Therapies  Risk  Complexity Considerations:   WNL   Planned  Pending:   Diagnostic left IA shoulder joint  injection #1 vs left suprascapular nerve block #1    Under consideration:   Diagnostic right hand digit 2 trigger finger injection Therapeutic bilateral IA Hyalgan knee injections  Diagnostic bilateral cervical facet block  Possible bilateral cervical facet RFA  Diagnostic left sided CESI  Diagnostic bilateral IA knee injection  Diagnostic bilateral genicular NB  Possible bilateral genicular nerve RFA  Diagnostic bilateral sacroiliac joint block  Possible bilateral sacroiliac joint RFA  Diagnostic bilateral IA shoulder joint injection  Diagnostic bilateral suprascapular NB  Possible bilateral suprascapular nerve RFA  Diagnostic bilateral lumbar facet block  Possible bilateral lumbar facet RFA  Diagnostic left L4-5 LESI    Completed:   Therapeutic left levator scapula, supraspinatus, and rhomboid muscle TPI/MNB x1 (03/30/2020) (100/100/85/75)  Diagnostic left C7-T1 cervical ESI x1 (06/21/2016) (90/90/70/75)  Diagnostic bilateral IA Hyalgan knee inj. x3 (06/21/2016) (left: 100/90/100/90/75) (right: 90/90/80/75)  Diagnostic/therapeutic right thumb inj. x1 (05/23/2016) (n/a) Diagnostic/therapeutic left thumb (CMC-Trapezium) inj. x1 (03/11/2020) (100/100/75/75)    Therapeutic  Palliative (PRN) options:   Therapeutic IA Hyalgan knee injections      Recent Visits No visits were found meeting these conditions. Showing recent visits within past 90 days and meeting all other requirements Today's Visits Date Type Provider Dept  10/29/23 Office Visit [name], MD [facility]  Showing today's visits and meeting all other requirements Future Appointments Date Type Provider Dept  01/23/24 Appointment [name], MD [facility]  Showing future appointments within next 90 days and meeting all other requirements  I discussed the assessment and treatment plan with the patient. The patient was provided an opportunity to ask questions and all were answered.  The patient agreed with the plan and demonstrated an understanding of the instructions.  Patient advised to call back or seek an in-person evaluation if the symptoms or condition worsens.  Duration of encounter: 30 minutes.  Total time on encounter, as per AMA guidelines included both the face-to-face and non-face-to-face time personally spent by the physician and/or other qualified health care professional(s) on the day of the encounter (includes time in activities  that require the physician or other qualified health care professional and does not include time in activities normally performed by clinical staff). Physician's time may include the following activities when performed: Preparing to see the patient (e.g., pre-charting review of records, searching for previously ordered imaging, lab work, and nerve conduction tests) Review of prior analgesic pharmacotherapies. Reviewing PMP Interpreting ordered tests (e.g., lab work, imaging, nerve conduction tests) Performing post-procedure evaluations, including interpretation of diagnostic procedures Obtaining and/or reviewing separately obtained history Performing a medically appropriate examination and/or evaluation Counseling and educating the patient/family/caregiver Ordering medications, tests, or procedures Referring and communicating with other health care professionals (when not separately reported) Documenting clinical information in the electronic or other health record Independently interpreting results (not separately reported) and communicating results to the patient/ family/caregiver Care coordination (not separately reported)  Note by: Oswaldo Done, MD Date: 10/29/2023; Time: 2:41 PM

## 2023-10-29 ENCOUNTER — Encounter: Payer: Self-pay | Admitting: Pain Medicine

## 2023-10-29 ENCOUNTER — Ambulatory Visit: Payer: Medicare Other | Attending: Pain Medicine | Admitting: Pain Medicine

## 2023-10-29 VITALS — BP 141/75 | HR 70 | Temp 97.1°F | Resp 16 | Ht 67.0 in | Wt 215.0 lb

## 2023-10-29 DIAGNOSIS — M542 Cervicalgia: Secondary | ICD-10-CM

## 2023-10-29 DIAGNOSIS — M47816 Spondylosis without myelopathy or radiculopathy, lumbar region: Secondary | ICD-10-CM | POA: Diagnosis not present

## 2023-10-29 DIAGNOSIS — G4486 Cervicogenic headache: Secondary | ICD-10-CM

## 2023-10-29 DIAGNOSIS — G894 Chronic pain syndrome: Secondary | ICD-10-CM | POA: Diagnosis not present

## 2023-10-29 DIAGNOSIS — Z79899 Other long term (current) drug therapy: Secondary | ICD-10-CM | POA: Diagnosis not present

## 2023-10-29 DIAGNOSIS — M5412 Radiculopathy, cervical region: Secondary | ICD-10-CM | POA: Insufficient documentation

## 2023-10-29 DIAGNOSIS — M533 Sacrococcygeal disorders, not elsewhere classified: Secondary | ICD-10-CM | POA: Diagnosis not present

## 2023-10-29 DIAGNOSIS — M545 Low back pain, unspecified: Secondary | ICD-10-CM | POA: Insufficient documentation

## 2023-10-29 DIAGNOSIS — Z79891 Long term (current) use of opiate analgesic: Secondary | ICD-10-CM

## 2023-10-29 DIAGNOSIS — G8929 Other chronic pain: Secondary | ICD-10-CM | POA: Diagnosis not present

## 2023-10-29 DIAGNOSIS — M961 Postlaminectomy syndrome, not elsewhere classified: Secondary | ICD-10-CM | POA: Diagnosis not present

## 2023-10-29 DIAGNOSIS — M25511 Pain in right shoulder: Secondary | ICD-10-CM | POA: Insufficient documentation

## 2023-10-29 DIAGNOSIS — M25512 Pain in left shoulder: Secondary | ICD-10-CM | POA: Diagnosis not present

## 2023-10-29 MED ORDER — OXYCODONE HCL 10 MG PO TABS: 10.0000 mg | ORAL_TABLET | Freq: Three times a day (TID) | ORAL | 0 refills | Status: DC | PRN

## 2023-10-29 MED ORDER — NALOXONE HCL 4 MG/0.1ML NA LIQD: 1.0000 | NASAL | 1 refills | Status: AC | PRN

## 2023-10-29 NOTE — Progress Notes (Signed)
 Nursing Pain Medication Assessment:  Safety precautions to be maintained throughout the outpatient stay will include: orient to surroundings, keep bed in low position, maintain call bell within reach at all times, provide assistance with transfer out of bed and ambulation.   Medication Inspection Compliance: Pill count conducted under aseptic conditions, in front of the patient. Neither the pills nor the bottle was removed from the patient's sight at any time. Once count was completed pills were immediately returned to the patient in their original bottle.  Medication: Oxycodone IR Pill/Patch Count:  14 of 90 pills remain Pill/Patch Appearance: Markings consistent with prescribed medication Bottle Appearance: Standard pharmacy container. Clearly labeled. Filled Date: 02 / 27 / 2025 Last Medication intake:  Today

## 2023-10-30 ENCOUNTER — Encounter: Payer: Self-pay | Admitting: Cardiology

## 2023-10-30 ENCOUNTER — Ambulatory Visit: Payer: Medicare Other | Attending: Cardiology | Admitting: Cardiology

## 2023-10-30 VITALS — BP 106/72 | Ht 67.0 in | Wt 219.0 lb

## 2023-10-30 DIAGNOSIS — Z950 Presence of cardiac pacemaker: Secondary | ICD-10-CM | POA: Diagnosis not present

## 2023-10-30 DIAGNOSIS — I10 Essential (primary) hypertension: Secondary | ICD-10-CM

## 2023-10-30 DIAGNOSIS — I495 Sick sinus syndrome: Secondary | ICD-10-CM

## 2023-10-30 DIAGNOSIS — E782 Mixed hyperlipidemia: Secondary | ICD-10-CM

## 2023-10-30 NOTE — Patient Instructions (Signed)
 Medication Instructions:  Your physician recommends that you continue on your current medications as directed. Please refer to the Current Medication list given to you today.   Labwork: None today  Testing/Procedures: None today  Follow-Up: 1 year  Any Other Special Instructions Will Be Listed Below (If Applicable).  If you need a refill on your cardiac medications before your next appointment, please call your pharmacy.

## 2023-10-30 NOTE — Progress Notes (Signed)
    Cardiology Office Note  Date: 10/30/2023   ID: Mark [name], DOB [birth year], MRN [id number]  History of Present Illness: Mark [name] is a 68 y.o. male last seen in February 2024.  He is here for a routine visit.  Reports no palpitations or exertional chest discomfort, no major change in stamina.  He walks his dog regularly.  Medtronic pacemaker in place with follow-up by Dr. Ladona Ridgel.  Device interrogation in February revealed normal function.  He has had no dizziness or syncope.  We went over his medications.  He continues to follow with Dr. [name] for primary care.  I did review his interval lab work which is noted below.  I reviewed his ECG today which shows an atrial paced rhythm.  Physical Exam: VS:  BP 106/72 (BP Location: Left Arm, Patient Position: Sitting, Cuff Size: Large)   Ht 5\' 7"  (1.702 m)   Wt 219 lb (99.3 kg)   SpO2 96%   BMI 34.30 kg/m , BMI Body mass index is 34.3 kg/m.  Wt Readings from Last 3 Encounters:  10/30/23 219 lb (99.3 kg)  10/29/23 215 lb (97.5 kg)  10/11/23 215 lb (97.5 kg)    General: Patient appears comfortable at rest. HEENT: Conjunctiva and lids normal. Neck: Supple, no elevated JVP or carotid bruits. Lungs: Clear to auscultation, nonlabored breathing at rest. Cardiac: Regular rate and rhythm, no S3, 1/6 systolic murmur, no pericardial rub.  ECG:  An ECG dated 09/27/2022 was personally reviewed today and demonstrated:  Atrial paced rhythm.  Labwork: 12/18/2022: ALT 40; AST 31; BUN 24; Creatinine, Ser 1.02; Hemoglobin 14.6; Platelets 205.0; Potassium 3.9; Sodium 138     Component Value Date/Time   CHOL 114 12/18/2022 0903   TRIG 145.0 12/18/2022 0903   HDL 40.30 12/18/2022 0903   CHOLHDL 3 12/18/2022 0903   VLDL 29.0 12/18/2022 0903   LDLCALC 44 12/18/2022 0903   LDLDIRECT 64.0 12/12/2021 4098   Other Studies Reviewed Today:  No interval cardiac testing for review today.  Assessment and Plan:  1.  Sinus node dysfunction  with history of symptomatic bradycardia status post Medtronic pacemaker.  He is following with Dr. Ladona Ridgel.  Remains asymptomatic and recent device interrogation indicated normal function.  ECG shows atrial pacing today.  Continue observation.   2.  Primary hypertension.  Keep follow-up with Dr. Altha Harm.  Currently on Norvasc 2.5 mg daily.   3.  Mixed hyperlipidemia.  LDL 44 in May 2024.  Continue Lipitor 20 mg daily.  Disposition:  Follow up  1 year.  Signed, Jonelle Sidle, M.D., F.A.C.C. [facility]

## 2023-11-19 ENCOUNTER — Other Ambulatory Visit: Payer: Self-pay | Admitting: Family Medicine

## 2023-11-19 DIAGNOSIS — G894 Chronic pain syndrome: Secondary | ICD-10-CM

## 2023-11-19 NOTE — Telephone Encounter (Signed)
 Naproxen Last filled:  10/11/23, #90 Last OV:  04/30/23, 4 mo DM f/u Next OV:  01/01/24, CPE

## 2023-12-18 ENCOUNTER — Ambulatory Visit (INDEPENDENT_AMBULATORY_CARE_PROVIDER_SITE_OTHER): Payer: Medicare Other

## 2023-12-18 DIAGNOSIS — I495 Sick sinus syndrome: Secondary | ICD-10-CM | POA: Diagnosis not present

## 2023-12-18 LAB — CUP PACEART REMOTE DEVICE CHECK
Battery Remaining Longevity: 84 mo
Battery Voltage: 2.98 V
Brady Statistic AP VP Percent: 0.03 %
Brady Statistic AP VS Percent: 99.93 %
Brady Statistic AS VP Percent: 0 %
Brady Statistic AS VS Percent: 0.05 %
Brady Statistic RA Percent Paced: 99.96 %
Brady Statistic RV Percent Paced: 0.03 %
Date Time Interrogation Session: 20250512192033
Implantable Lead Connection Status: 753985
Implantable Lead Connection Status: 753985
Implantable Lead Implant Date: 20190509
Implantable Lead Implant Date: 20190509
Implantable Lead Location: 753859
Implantable Lead Location: 753862
Implantable Lead Model: 5092
Implantable Lead Model: 5592
Implantable Pulse Generator Implant Date: 20190509
Lead Channel Impedance Value: 380 Ohm
Lead Channel Impedance Value: 380 Ohm
Lead Channel Impedance Value: 551 Ohm
Lead Channel Impedance Value: 589 Ohm
Lead Channel Pacing Threshold Amplitude: 0.75 V
Lead Channel Pacing Threshold Amplitude: 1.125 V
Lead Channel Pacing Threshold Pulse Width: 0.4 ms
Lead Channel Pacing Threshold Pulse Width: 0.4 ms
Lead Channel Sensing Intrinsic Amplitude: 2 mV
Lead Channel Sensing Intrinsic Amplitude: 2 mV
Lead Channel Sensing Intrinsic Amplitude: 7.125 mV
Lead Channel Sensing Intrinsic Amplitude: 7.125 mV
Lead Channel Setting Pacing Amplitude: 2 V
Lead Channel Setting Pacing Amplitude: 2.25 V
Lead Channel Setting Pacing Pulse Width: 0.4 ms
Lead Channel Setting Sensing Sensitivity: 2 mV
Zone Setting Status: 755011
Zone Setting Status: 755011

## 2023-12-18 LAB — LAB REPORT - SCANNED
Creatinine, POC: 0.73 mg/dL
EGFR: 90

## 2023-12-19 ENCOUNTER — Other Ambulatory Visit: Payer: Self-pay | Admitting: Family Medicine

## 2023-12-19 DIAGNOSIS — E1169 Type 2 diabetes mellitus with other specified complication: Secondary | ICD-10-CM

## 2023-12-19 DIAGNOSIS — D509 Iron deficiency anemia, unspecified: Secondary | ICD-10-CM

## 2023-12-19 DIAGNOSIS — E785 Hyperlipidemia, unspecified: Secondary | ICD-10-CM

## 2023-12-19 DIAGNOSIS — N138 Other obstructive and reflux uropathy: Secondary | ICD-10-CM

## 2023-12-24 ENCOUNTER — Ambulatory Visit: Payer: Self-pay | Admitting: Internal Medicine

## 2023-12-25 ENCOUNTER — Telehealth: Payer: Self-pay

## 2023-12-25 ENCOUNTER — Other Ambulatory Visit (INDEPENDENT_AMBULATORY_CARE_PROVIDER_SITE_OTHER): Payer: Medicare Other

## 2023-12-25 ENCOUNTER — Other Ambulatory Visit (HOSPITAL_COMMUNITY): Payer: Self-pay

## 2023-12-25 DIAGNOSIS — N401 Enlarged prostate with lower urinary tract symptoms: Secondary | ICD-10-CM

## 2023-12-25 DIAGNOSIS — E785 Hyperlipidemia, unspecified: Secondary | ICD-10-CM

## 2023-12-25 DIAGNOSIS — D509 Iron deficiency anemia, unspecified: Secondary | ICD-10-CM

## 2023-12-25 DIAGNOSIS — N138 Other obstructive and reflux uropathy: Secondary | ICD-10-CM | POA: Diagnosis not present

## 2023-12-25 DIAGNOSIS — E1169 Type 2 diabetes mellitus with other specified complication: Secondary | ICD-10-CM | POA: Diagnosis not present

## 2023-12-25 LAB — COMPREHENSIVE METABOLIC PANEL WITH GFR
ALT: 27 U/L (ref 0–53)
AST: 25 U/L (ref 0–37)
Albumin: 4 g/dL (ref 3.5–5.2)
Alkaline Phosphatase: 58 U/L (ref 39–117)
BUN: 29 mg/dL — ABNORMAL HIGH (ref 6–23)
CO2: 27 meq/L (ref 19–32)
Calcium: 9.1 mg/dL (ref 8.4–10.5)
Chloride: 103 meq/L (ref 96–112)
Creatinine, Ser: 0.93 mg/dL (ref 0.40–1.50)
GFR: 85.29 mL/min (ref >60.00)
Glucose, Bld: 119 mg/dL — ABNORMAL HIGH (ref 70–99)
Potassium: 3.9 meq/L (ref 3.5–5.1)
Sodium: 140 meq/L (ref 135–145)
Total Bilirubin: 0.5 mg/dL (ref 0.2–1.2)
Total Protein: 6.2 g/dL (ref 6.0–8.3)

## 2023-12-25 LAB — CBC WITH DIFFERENTIAL/PLATELET
Basophils Absolute: 0.1 10*3/uL (ref 0.0–0.1)
Basophils Relative: 1.1 % (ref 0.0–3.0)
Eosinophils Absolute: 0.2 10*3/uL (ref 0.0–0.7)
Eosinophils Relative: 2.5 % (ref 0.0–5.0)
HCT: 40.9 % (ref 39.0–52.0)
Hemoglobin: 14.2 g/dL (ref 13.0–17.0)
Lymphocytes Relative: 25.3 % (ref 12.0–46.0)
Lymphs Abs: 1.8 10*3/uL (ref 0.7–4.0)
MCHC: 34.6 g/dL (ref 30.0–36.0)
MCV: 87.7 fl (ref 78.0–100.0)
Monocytes Absolute: 0.8 10*3/uL (ref 0.1–1.0)
Monocytes Relative: 11.7 % (ref 3.0–12.0)
Neutro Abs: 4.2 10*3/uL (ref 1.4–7.7)
Neutrophils Relative %: 59.4 % (ref 43.0–77.0)
Platelets: 169 10*3/uL (ref 150.0–400.0)
RBC: 4.66 Mil/uL (ref 4.22–5.81)
RDW: 12.9 % (ref 11.5–15.5)
WBC: 7.1 10*3/uL (ref 4.0–10.5)

## 2023-12-25 LAB — FERRITIN: Ferritin: 245.1 ng/mL (ref 22.0–322.0)

## 2023-12-25 LAB — LIPID PANEL
Cholesterol: 119 mg/dL (ref 0–200)
HDL: 44.4 mg/dL (ref >39.00)
LDL Cholesterol: 55 mg/dL (ref 0–99)
NonHDL: 74.37
Total CHOL/HDL Ratio: 3
Triglycerides: 95 mg/dL (ref 0.0–149.0)
VLDL: 19 mg/dL (ref 0.0–40.0)

## 2023-12-25 LAB — VITAMIN B12: Vitamin B-12: 1303 pg/mL — ABNORMAL HIGH (ref 211–911)

## 2023-12-25 LAB — PSA: PSA: 0.74 ng/mL (ref 0.10–4.00)

## 2023-12-25 LAB — IBC PANEL
Iron: 71 ug/dL (ref 42–165)
Saturation Ratios: 24.7 % (ref 20.0–50.0)
TIBC: 287 ug/dL (ref 250.0–450.0)
Transferrin: 205 mg/dL — ABNORMAL LOW (ref 212.0–360.0)

## 2023-12-25 LAB — HEMOGLOBIN A1C: Hgb A1c MFr Bld: 5.4 % (ref 4.6–6.5)

## 2023-12-25 NOTE — Telephone Encounter (Signed)
*  renewal request  Patient no longer taking due to not being able to tolerate.  Closing request received via CMM

## 2023-12-26 ENCOUNTER — Ambulatory Visit: Payer: Self-pay | Admitting: Family Medicine

## 2023-12-26 LAB — MICROALBUMIN / CREATININE URINE RATIO
Creatinine,U: 30.2 mg/dL
Microalb Creat Ratio: UNDETERMINED mg/g (ref 0.0–30.0)
Microalb, Ur: <0.7 mg/dL

## 2023-12-27 ENCOUNTER — Ambulatory Visit (INDEPENDENT_AMBULATORY_CARE_PROVIDER_SITE_OTHER): Payer: Medicare Other

## 2023-12-27 VITALS — Ht 67.0 in | Wt 219.0 lb

## 2023-12-27 DIAGNOSIS — Z0000 Encounter for general adult medical examination without abnormal findings: Secondary | ICD-10-CM | POA: Diagnosis not present

## 2023-12-27 NOTE — Patient Instructions (Signed)
 Mark [name] , Thank you for taking time out of your busy schedule to complete your Annual Wellness Visit with me. I enjoyed our conversation and look forward to speaking with you again next year. I, as well as your care team,  appreciate your ongoing commitment to your health goals. Please review the following plan we discussed and let me know if I can assist you in the future. Your Game plan/ To Do List     Follow up Visits: Next Medicare AWV with our clinical staff: 12/30/24 @ 3pm televisit   Have you seen your provider in the last 6 months (3 months if uncontrolled diabetes)? No Next Office Visit with your provider: 01/01/24 @ 2:30pm  Clinician Recommendations:  Aim for 30 minutes of exercise or brisk walking, 6-8 glasses of water, and 5 servings of fruits and vegetables each day.       This is a list of the screening recommended for you and due dates:  Health Maintenance  Topic Date Due   Zoster (Shingles) Vaccine (1 of 2) Never done   COVID-19 Vaccine (6 - 2024-25 season) 04/08/2023   Eye exam for diabetics  04/20/2023   Flu Shot  03/07/2024   Complete foot exam   04/30/2024   Hemoglobin A1C  06/26/2024   Yearly kidney function blood test for diabetes  12/24/2024   Yearly kidney health urinalysis for diabetes  12/24/2024   Medicare Annual Wellness Visit  12/26/2024   DTaP/Tdap/Td vaccine (3 - Td or Tdap) 04/15/2030   Colon Cancer Screening  06/13/2030   Pneumonia Vaccine  Completed   Hepatitis C Screening  Completed   HPV Vaccine  Aged Out   Meningitis B Vaccine  Aged Out   Cologuard (Stool DNA test)  Discontinued    Advanced directives: (Declined) Advance directive discussed with you today. Even though you declined this today, please call our office should you change your mind, and we can give you the proper paperwork for you to fill out. Advance Care Planning is important because it:  [x]  Makes sure you receive the medical care that is consistent with your values, goals, and  preferences  [x]  It provides guidance to your family and loved ones and reduces their decisional burden about whether or not they are making the right decisions based on your wishes.  Follow the link provided in your after visit summary or read over the paperwork we have mailed to you to help you started getting your Advance Directives in place. If you need assistance in completing these, please reach out to us  so that we can help you!

## 2023-12-27 NOTE — Progress Notes (Signed)
 Please attest and cosign this visit due to patients primary care provider not being in the office at the time the visit was completed.   Subjective:   Mark [name] is a 68 y.o. who presents for a Medicare Wellness preventive visit.  As a reminder, Annual Wellness Visits don't include a physical exam, and some assessments may be limited, especially if this visit is performed virtually. We may recommend an in-person follow-up visit with your provider if needed.  Visit Complete: Virtual I connected with  Mark [name] on 12/27/23 by a audio enabled telemedicine application and verified that I am speaking with the correct person using two identifiers.  Patient Location: Home  Provider Location: Office/Clinic  I discussed the limitations of evaluation and management by telemedicine. The patient expressed understanding and agreed to proceed.  Vital Signs: Because this visit was a virtual/telehealth visit, some criteria may be missing or patient reported. Any vitals not documented were not able to be obtained and vitals that have been documented are patient reported.  VideoDeclined- This patient declined Librarian, academic. Therefore the visit was completed with audio only.  Persons Participating in Visit: Patient.  AWV Questionnaire: Yes: Patient Medicare AWV questionnaire was completed by the patient on 12/14/23; I have confirmed that all information answered by patient is correct and no changes since this date.  Cardiac Risk Factors include: advanced age (>96men, >39 women);diabetes mellitus;dyslipidemia;male gender;hypertension;obesity (BMI >30kg/m2)     Objective:     Today's Vitals   12/24/23 1710 12/27/23 1536  Weight:  219 lb (99.3 kg)  Height:  5\' 7"  (1.702 m)  PainSc: 4     Body mass index is 34.3 kg/m.     12/27/2023    3:52 PM 10/29/2023    1:44 PM 07/25/2023    2:37 PM 06/14/2023    9:17 AM 06/11/2023    9:25 AM 05/17/2023    1:57 PM  01/29/2023    2:41 PM  Advanced Directives  Does Patient Have a Medical Advance Directive? No No No No No No No  Would patient like information on creating a medical advance directive?  No - Patient declined No - Patient declined No - Patient declined No - Patient declined      Current Medications (verified) Outpatient Encounter Medications as of 12/27/2023  Medication Sig   amLODipine  (NORVASC ) 2.5 MG tablet TAKE 1 TABLET BY MOUTH DAILY (in place of losartan )   Ascorbic Acid (VITAMIN C) 1000 MG tablet Take 1,000 mg by mouth daily.   aspirin 81 MG tablet Take 81 mg by mouth daily.   atorvastatin  (LIPITOR) 20 MG tablet Take 1 tablet (20 mg total) by mouth at bedtime.   B Complex-C (SUPER B COMPLEX/VITAMIN C PO) Take by mouth daily.   Cholecalciferol (VITAMIN D3 PO) Take 2,000 Int'l Units by mouth daily.   Cyanocobalamin  (VITAMIN B12 PO) Take by mouth daily.   cyclobenzaprine  (FLEXERIL ) 10 MG tablet TAKE ONE-HALF TO ONE TABLET BY MOUTH 2 TIMES DAILY AS NEEDED FOR MUSCLE SPASMS   diphenhydrAMINE  (BENADRYL  ALLERGY) 25 MG tablet Take 1 tablet (25 mg total) by mouth 2 (two) times daily as needed for allergies.   FLUoxetine  (PROZAC ) 20 MG capsule Take 1 capsule (20 mg total) by mouth daily.   FLUoxetine  (PROZAC ) 40 MG capsule Take 1 capsule (40 mg total) by mouth daily.   fluticasone  (FLONASE ) 50 MCG/ACT nasal spray Place 2 sprays into both nostrils daily.   furosemide  (LASIX ) 20 MG tablet Take  1 tablet (20 mg total) by mouth daily.   Garlic 100 MG TABS Take by mouth daily.   metFORMIN  (GLUCOPHAGE -XR) 500 MG 24 hr tablet Take 1 tablet (500 mg total) by mouth daily with breakfast.   Multiple Vitamins-Minerals (CERTAVITE SENIOR/ANTIOXIDANT) TABS Take 1 tablet by mouth daily.   naloxone  (NARCAN ) nasal spray 4 mg/0.1 mL Place 1 spray into the nose as needed for up to 365 doses (for opioid-induced respiratory depresssion). In case of emergency (overdose), spray once into each nostril. If no response  within 3 minutes, repeat application and call 911.   naproxen  (NAPROSYN ) 500 MG tablet TAKE 1 TABLET BY MOUTH 2 TIMES DAILY AS NEEDED FOR MODERATE PAIN   Omega-3 Fatty Acids (FISH OIL) 1000 MG CAPS Take by mouth daily.   omeprazole  (PRILOSEC) 20 MG capsule Take 1 capsule (20 mg total) by mouth daily.   Oxycodone  HCl 10 MG TABS Take 1 tablet (10 mg total) by mouth every 8 (eight) hours as needed. Each refill must last 30 days.   Oxycodone  HCl 10 MG TABS Take 1 tablet (10 mg total) by mouth every 8 (eight) hours as needed. Each refill must last 30 days.   [START ON 12/29/2023] Oxycodone  HCl 10 MG TABS Take 1 tablet (10 mg total) by mouth every 8 (eight) hours as needed. Each refill must last 30 days.   potassium citrate  (UROCIT-K ) 10 MEQ (1080 MG) SR tablet Take 1 tablet (10 mEq total) by mouth 2 (two) times daily.   rOPINIRole  (REQUIP ) 2 MG tablet Take 1 tablet (2 mg total) by mouth at bedtime.   sildenafil  (REVATIO ) 20 MG tablet Take 20 mg by mouth. 3 -5 tablets as needed   No facility-administered encounter medications on file as of 12/27/2023.    Allergies (verified) Ciprofloxacin , Losartan , Metformin , Buprenorphine hcl, Morphine  and codeine, and Penicillins   History: Past Medical History:  Diagnosis Date   Anxiety    Arthritis    Chronic pain    Depression    Diabetes mellitus without complication (HCC)    Heart murmur    History of kidney stones 11/17/2015   Removed two kidney stones about 1 cm in size   Hydrocele in adult 05/07/2015   Hypertension    Kidney stones    MRSA carrier 2019   Presence of permanent cardiac pacemaker    Medtronic implanted 2009 for sinus node dysfunction: generator change 2019   Prostatitis    Sleep apnea    Does not wear CPAP   Past Surgical History:  Procedure Laterality Date   CARDIOVASCULAR STRESS TEST  2014   Dr Cara Chancellor   CARPAL TUNNEL RELEASE Bilateral 2006   CERVICAL SPINE SURGERY  2008   C5/6   CHOLECYSTECTOMY N/A 10/27/2020    Procedure: LAPAROSCOPIC CHOLECYSTECTOMY;  Surgeon: Dorena Gander, MD;  Location[facility] OR;  Service: General;  Laterality: N/A;   COLONOSCOPY  07/2007   prostate nodule, hemorrhoid, rpt 5 yrs Felicita Horns)   COLONOSCOPY  06/2023   TA, diverticulosis, hemorrhoids, rpt 7 yrs ([place])   COLONOSCOPY WITH PROPOFOL  N/A 06/14/2023   Procedure: COLONOSCOPY WITH PROPOFOL ;  Surgeon: Hargis Lias, MD;  Location: [facility] ENDO SUITE;  Service: Endoscopy;  Laterality: N/A;  11:00AM;ASA 1-2   ERCP N/A 10/26/2020   Procedure: ENDOSCOPIC RETROGRADE CHOLANGIOPANCREATOGRAPHY (ERCP);  Surgeon: Alvis Jourdain, MD;  Location: [facility] ENDOSCOPY;  Service: Endoscopy;  Laterality: N/A;  choledocholithiasis   ESOPHAGOGASTRODUODENOSCOPY (EGD) WITH PROPOFOL  N/A 10/26/2020   Procedure: ESOPHAGOGASTRODUODENOSCOPY (EGD) WITH PROPOFOL ;  Surgeon: Alvis Jourdain,  MD;  Location: [facility] ENDOSCOPY;  Service: Endoscopy;  Laterality: N/A;   EUS  10/26/2020   Procedure: UPPER ENDOSCOPIC ULTRASOUND (EUS) LINEAR;  Surgeon: Alvis Jourdain, MD;  Location: [facility] ENDOSCOPY;  Service: Endoscopy;;   IMPLANTABLE CARDIOVERTER DEFIBRILLATOR (ICD) GENERATOR CHANGE Left 12/13/2017   Procedure: PACER CHANGE OUT;  Surgeon: Percival Brace, MD;  Location: [facility] ORS;  Service: Cardiovascular;  Laterality: Left;   LAPAROSCOPIC CHOLECYSTECTOMY  10/2020   LITHOTRIPSY Bilateral 2012   Stoioff   LITHOTRIPSY Left 11/2015   x2 Stoioff   pace maker revision  12/13/2017   PACEMAKER PLACEMENT  2009   s/p Medtronic Adapta DR Pacemaker (Dr. Cara Chancellor)   POLYPECTOMY  06/14/2023   Procedure: POLYPECTOMY;  Surgeon: Hargis Lias, MD;  Location: [facility] ENDO SUITE;  Service: Endoscopy;;   REMOVAL OF STONES  10/26/2020   Procedure: REMOVAL OF STONES;  Surgeon: Alvis Jourdain, MD;  Location: [facility] ENDOSCOPY;  Service: Endoscopy;;   ROTATOR CUFF REPAIR Left 2009   SPHINCTEROTOMY  10/26/2020   Procedure: [address];  Surgeon: Alvis Jourdain, MD;  Location: [facility] ENDOSCOPY;  Service:  Endoscopy;;   TONSILLECTOMY  1964   ULNAR NERVE REPAIR  2008   Left   Family History  Problem Relation Age of Onset   Heart disease Mother    Diabetes Mother    Arthritis Mother    Cancer Mother        Breast Cancer   Heart block Father    Heart disease Father    Arthritis Father    Cancer Father        skin cancer   Social History   Socioeconomic History   Marital status: Divorced    Spouse name: Not on file   Number of children: Not on file   Years of education: Not on file   Highest education level: 8th grade  Occupational History   Not on file  Tobacco Use   Smoking status: Never    Passive exposure: Past   Smokeless tobacco: Never  Vaping Use   Vaping status: Never Used  Substance and Sexual Activity   Alcohol use: No   Drug use: No   Sexual activity: Never  Other Topics Concern   Not on file  Social History Narrative   Divorced. Lives with wife.   Edu: 8th grade   Occ: retired, disability for chronic neck pain    Activity: no regular exercise - limited by back and knee pain   Diet: poor water, lots of tang and gatorade, no vegetables or fruits   Social Drivers of Corporate investment banker Strain: Low Risk  (12/24/2023)   Overall Financial Resource Strain (CARDIA)    Difficulty of Paying Living Expenses: Not hard at all  Food Insecurity: No Food Insecurity (12/24/2023)   Hunger Vital Sign    Worried About Running Out of Food in the Last Year: Never true    Ran Out of Food in the Last Year: Never true  Transportation Needs: No Transportation Needs (12/24/2023)   PRAPARE - Administrator, Civil Service (Medical): No    Lack of Transportation (Non-Medical): No  Physical Activity: Sufficiently Active (12/27/2023)   Exercise Vital Sign    Days of Exercise per Week: 7 days    Minutes of Exercise per Session: 30 min  Stress: No Stress Concern Present (12/24/2023)   [employer] of Occupational Health - Occupational Stress Questionnaire     Feeling of Stress : Not at all  Social Connections: Socially  Isolated (12/24/2023)   Social Connection and Isolation Panel [NHANES]    Frequency of Communication with Friends and Family: More than three times a week    Frequency of Social Gatherings with Friends and Family: More than three times a week    Attends Religious Services: Never    Database administrator or Organizations: No    Attends Engineer, structural: Not on file    Marital Status: Divorced    Tobacco Counseling Counseling given: Not Answered    Clinical Intake:  Pre-visit preparation completed: Yes  Pain : 0-10 Pain Score: 4  Pain Type: Chronic pain Pain Location: Shoulder Pain Orientation: Left Pain Descriptors / Indicators: Aching, Dull, Sharp Pain Onset: More than a month ago Pain Frequency: Constant Pain Relieving Factors: medication, get off feet and decrease movements  Pain Relieving Factors: medication, get off feet and decrease movements  BMI - recorded: 34.3 Nutritional Status: BMI > 30  Obese Nutritional Risks: None Diabetes: No  Lab Results  Component Value Date   HGBA1C 5.4 12/25/2023   HGBA1C 5.6 05/01/2023   HGBA1C 5.9 12/18/2022     How often do you need to have someone help you when you read instructions, pamphlets, or other written materials from your doctor or pharmacy?: 1 - Never  Interpreter Needed?: No  Comments: lives alone Information entered by :: B.Krystn Dermody,LPN   Activities of Daily Living     12/24/2023    5:10 PM 06/11/2023    9:28 AM  In your present state of health, do you have any difficulty performing the following activities:  Hearing? 0   Vision? 0   Difficulty concentrating or making decisions? 0   Walking or climbing stairs? 0   Dressing or bathing? 0   Doing errands, shopping? 0 0  Preparing Food and eating ? N   Using the Toilet? N   In the past six months, have you accidently leaked urine? N   Do you have problems with loss of bowel  control? N   Managing your Medications? N   Managing your Finances? N   Housekeeping or managing your Housekeeping? N     Patient Care Team: Claire Crick, MD as PCP - General (Family Medicine) Gerard Knight, MD as PCP - Cardiology (Cardiology) [place], DDS, PA as Referring Physician (Dentistry) Renaldo Caroli, MD as Referring Physician (Pain Medicine) Ronney Cola, MD as Consulting Physician (Cardiology) Enos Harts, MD as Consulting Physician (Pulmonary Disease) Geraline Knapp, MD (Urology) [facility] as Referring Physician (Optometry) [place], Delaney Fearing, [facility] (Inactive) as Pharmacist (Pharmacist)  Indicate any recent Medical Services you may have received from other than [facility] providers in the past year (date may be approximate).     Assessment:    This is a routine wellness examination for [place].  Hearing/Vision screen Hearing Screening - Comments:: Pt says his hearing is good Vision Screening - Comments:: Pt says his vision is good w/glasses [facility] [place]   Goals Addressed             This Visit's Progress    Patient Stated       12/27/23-I would like to lose 20lbs       Depression Screen     12/27/2023    3:48 PM 10/29/2023    1:44 PM 07/25/2023    2:36 PM 05/17/2023    1:56 PM 05/01/2023    3:32 PM 01/29/2023    2:41 PM 11/02/2022    4:21 PM  PHQ 2/9 Scores  PHQ - 2 Score 0 0 0 0 2 0 0  PHQ- 9 Score     4  0    Fall Risk     12/24/2023    5:10 PM 10/29/2023    1:44 PM 07/25/2023    2:36 PM 05/17/2023    1:56 PM 05/07/2023   11:30 AM  Fall Risk   Falls in the past year? 0 0 0 0 0  Number falls in past yr: 0 0 0    Injury with Fall? 0 0 0    Risk for fall due to : No Fall Risks      Follow up Education provided;Falls prevention discussed        MEDICARE RISK AT HOME:  Medicare Risk at Home Any stairs in or around the home?: (Patient-Rptd) Yes If so, are there any without handrails?: (Patient-Rptd)  No Home free of loose throw rugs in walkways, pet beds, electrical cords, etc?: (Patient-Rptd) Yes Adequate lighting in your home to reduce risk of falls?: (Patient-Rptd) Yes Life alert?: (Patient-Rptd) No Use of a cane, walker or w/c?: (Patient-Rptd) No Grab bars in the bathroom?: (Patient-Rptd) No Shower chair or bench in shower?: (Patient-Rptd) No Elevated toilet seat or a handicapped toilet?: (Patient-Rptd) No  TIMED UP AND GO:  Was the test performed?  No  Cognitive Function: 6CIT completed    08/27/2020    8:27 AM 08/20/2019    2:12 PM 08/16/2018    2:33 PM 08/13/2017    2:32 PM 08/04/2016   10:26 AM  MMSE - Mini Mental State Exam  Orientation to time 5 5 5 5 5   Orientation to Place 5 5 5 5 5   Registration 3 3 3 3 3   Attention/ Calculation 5 0 0 0 0  Attention/Calculation-comments  can't spell     Recall 3 3 2 3 1   Recall-comments   unable to recall 1 of 3 words  pt was unable to recall 2 of 3 words  Language- name 2 objects   0 0 0  Language- repeat 1 1 1 1 1   Language- follow 3 step command   3 3 3   Language- read & follow direction   0 0 0  Write a sentence   0 0 0  Copy design   0 0 0  Total score   19 20 18         12/27/2023    3:59 PM 11/02/2022    4:13 PM 10/31/2021   11:24 AM  6CIT Screen  What Year? 0 points 0 points 0 points  What month? 0 points 0 points 0 points  What time? 0 points 0 points 0 points  Count back from 20 0 points 0 points 0 points  Months in reverse 0 points 0 points 0 points  Repeat phrase 8 points 0 points 2 points  Total Score 8 points 0 points 2 points    Immunizations Immunization History  Administered Date(s) Administered   Fluad Quad(high Dose 65+) 05/04/2022   Fluad Trivalent(High Dose 65+) 05/01/2023   Influenza, High Dose Seasonal PF 05/04/2021   Influenza,inj,Quad PF,6+ Mos 04/22/2013, 04/28/2014, 05/07/2015, 04/11/2016, 05/22/2017, 05/16/2018   Influenza-Unspecified 06/16/2019, 04/15/2020   Moderna Covid-19 Vaccine  Bivalent Booster 13yrs & up 05/05/2021   Moderna Sars-Covid-2 Vaccination 11/06/2019, 12/04/2019, 07/07/2020   PNEUMOCOCCAL CONJUGATE-20 03/27/2022   Pfizer(Comirnaty)Fall Seasonal Vaccine 12 years and older 05/05/2022   Pneumococcal Polysaccharide-23 05/30/2006   Tdap 11/19/2009, 04/15/2020    Screening Tests  Health Maintenance  Topic Date Due   Zoster Vaccines- Shingrix (1 of 2) Never done   COVID-19 Vaccine (6 - 2024-25 season) 04/08/2023   OPHTHALMOLOGY EXAM  04/20/2023   INFLUENZA VACCINE  03/07/2024   FOOT EXAM  04/30/2024   HEMOGLOBIN A1C  06/26/2024   Diabetic kidney evaluation - eGFR measurement  12/24/2024   Diabetic kidney evaluation - Urine ACR  12/24/2024   Medicare Annual Wellness (AWV)  12/26/2024   DTaP/Tdap/Td (3 - Td or Tdap) 04/15/2030   Colonoscopy  06/13/2030   Pneumonia Vaccine 42+ Years old  Completed   Hepatitis C Screening  Completed   HPV VACCINES  Aged Out   Meningococcal B Vaccine  Aged Out   Fecal DNA (Cologuard)  Discontinued    Health Maintenance  Health Maintenance Due  Topic Date Due   Zoster Vaccines- Shingrix (1 of 2) Never done   COVID-19 Vaccine (6 - 2024-25 season) 04/08/2023   OPHTHALMOLOGY EXAM  04/20/2023   Health Maintenance Items Addressed:   Additional Screening:  Vision Screening: Recommended annual ophthalmology exams for early detection of glaucoma and other disorders of the eye.  Dental Screening: Recommended annual dental exams for proper oral hygiene  Community Resource Referral / Chronic Care Management: CRR required this visit?  No   CCM required this visit?  No   Plan:    I have personally reviewed and noted the following in the patient's chart:   Medical and social history Use of alcohol, tobacco or illicit drugs  Current medications and supplements including opioid prescriptions. Patient is currently taking opioid prescriptions. Information provided to patient regarding non-opioid alternatives. Patient  advised to discuss non-opioid treatment plan with their provider. Functional ability and status Nutritional status Physical activity Advanced directives List of other physicians Hospitalizations, surgeries, and ER visits in previous 12 months Vitals Screenings to include cognitive, depression, and falls Referrals and appointments  In addition, I have reviewed and discussed with patient certain preventive protocols, quality metrics, and best practice recommendations. A written personalized care plan for preventive services as well as general preventive health recommendations were provided to patient.   Nerissa Bannister, LPN   1/61/0960   After Visit Summary: (MyChart) Due to this being a telephonic visit, the after visit summary with patients personalized plan was offered to patient via MyChart   Notes: Nothing significant to report at this time.

## 2023-12-29 ENCOUNTER — Other Ambulatory Visit: Payer: Self-pay | Admitting: Urology

## 2023-12-29 ENCOUNTER — Other Ambulatory Visit: Payer: Self-pay | Admitting: Family Medicine

## 2023-12-29 DIAGNOSIS — G894 Chronic pain syndrome: Secondary | ICD-10-CM

## 2024-01-01 ENCOUNTER — Encounter: Payer: Self-pay | Admitting: Family Medicine

## 2024-01-01 ENCOUNTER — Ambulatory Visit (INDEPENDENT_AMBULATORY_CARE_PROVIDER_SITE_OTHER): Payer: Medicare Other | Admitting: Family Medicine

## 2024-01-01 VITALS — BP 150/72 | HR 71 | Temp 98.4°F | Ht 66.5 in | Wt 219.0 lb

## 2024-01-01 DIAGNOSIS — Z7189 Other specified counseling: Secondary | ICD-10-CM

## 2024-01-01 DIAGNOSIS — D509 Iron deficiency anemia, unspecified: Secondary | ICD-10-CM

## 2024-01-01 DIAGNOSIS — Z950 Presence of cardiac pacemaker: Secondary | ICD-10-CM

## 2024-01-01 DIAGNOSIS — E1169 Type 2 diabetes mellitus with other specified complication: Secondary | ICD-10-CM | POA: Diagnosis not present

## 2024-01-01 DIAGNOSIS — I10 Essential (primary) hypertension: Secondary | ICD-10-CM | POA: Diagnosis not present

## 2024-01-01 DIAGNOSIS — N200 Calculus of kidney: Secondary | ICD-10-CM

## 2024-01-01 DIAGNOSIS — E66811 Obesity, class 1: Secondary | ICD-10-CM

## 2024-01-01 DIAGNOSIS — K219 Gastro-esophageal reflux disease without esophagitis: Secondary | ICD-10-CM

## 2024-01-01 DIAGNOSIS — G2581 Restless legs syndrome: Secondary | ICD-10-CM

## 2024-01-01 DIAGNOSIS — F331 Major depressive disorder, recurrent, moderate: Secondary | ICD-10-CM

## 2024-01-01 DIAGNOSIS — F1120 Opioid dependence, uncomplicated: Secondary | ICD-10-CM

## 2024-01-01 DIAGNOSIS — E785 Hyperlipidemia, unspecified: Secondary | ICD-10-CM

## 2024-01-01 DIAGNOSIS — G4733 Obstructive sleep apnea (adult) (pediatric): Secondary | ICD-10-CM

## 2024-01-01 DIAGNOSIS — Z0000 Encounter for general adult medical examination without abnormal findings: Secondary | ICD-10-CM

## 2024-01-01 DIAGNOSIS — J302 Other seasonal allergic rhinitis: Secondary | ICD-10-CM

## 2024-01-01 DIAGNOSIS — G894 Chronic pain syndrome: Secondary | ICD-10-CM

## 2024-01-01 MED ORDER — FLUOXETINE HCL 20 MG PO CAPS
20.0000 mg | ORAL_CAPSULE | Freq: Every day | ORAL | 4 refills | Status: DC
Start: 2024-01-01 — End: 2024-01-28

## 2024-01-01 MED ORDER — NAPROXEN 500 MG PO TABS: 500.0000 mg | ORAL_TABLET | Freq: Every day | ORAL | 0 refills | Status: DC | PRN

## 2024-01-01 MED ORDER — DIPHENHYDRAMINE HCL 25 MG PO TABS: 25.0000 mg | ORAL_TABLET | Freq: Every evening | ORAL | Status: AC | PRN

## 2024-01-01 MED ORDER — OMEPRAZOLE 20 MG PO CPDR: 20.0000 mg | DELAYED_RELEASE_CAPSULE | Freq: Every day | ORAL | 4 refills | Status: AC

## 2024-01-01 MED ORDER — METFORMIN HCL ER 500 MG PO TB24: 500.0000 mg | ORAL_TABLET | Freq: Every day | ORAL | 4 refills | Status: AC

## 2024-01-01 MED ORDER — AMLODIPINE BESYLATE 2.5 MG PO TABS
ORAL_TABLET | ORAL | 4 refills | Status: AC
Start: 2024-01-01 — End: ?

## 2024-01-01 MED ORDER — FLUOXETINE HCL 40 MG PO CAPS: 40.0000 mg | ORAL_CAPSULE | Freq: Every day | ORAL | 4 refills | Status: DC

## 2024-01-01 MED ORDER — FUROSEMIDE 20 MG PO TABS: 20.0000 mg | ORAL_TABLET | Freq: Every day | ORAL | 4 refills | Status: AC

## 2024-01-01 MED ORDER — FLUTICASONE PROPIONATE 50 MCG/ACT NA SUSP
2.0000 | Freq: Every day | NASAL | 4 refills | Status: AC
Start: 2024-01-01 — End: ?

## 2024-01-01 MED ORDER — ATORVASTATIN CALCIUM 20 MG PO TABS: 20.0000 mg | ORAL_TABLET | Freq: Every day | ORAL | 4 refills | Status: AC

## 2024-01-01 MED ORDER — ROPINIROLE HCL 2 MG PO TABS: 2.0000 mg | ORAL_TABLET | Freq: Every day | ORAL | 4 refills | Status: AC

## 2024-01-01 NOTE — Assessment & Plan Note (Signed)
 Asked to bring us  copy when notarized/completed.

## 2024-01-01 NOTE — Assessment & Plan Note (Signed)
 Continues requip  2mg  nightly. Discussed dosing 2 hours prior to bedtime.

## 2024-01-01 NOTE — Assessment & Plan Note (Signed)
 Recurrent kidney stones now on K citrate - continue this.

## 2024-01-01 NOTE — Assessment & Plan Note (Signed)
 Preventative protocols reviewed and updated unless pt declined. Discussed healthy diet and lifestyle.

## 2024-01-01 NOTE — Patient Instructions (Addendum)
 Happy early bday!  Continue current medicines Bring me copy of advanced directive when complete.  Good to see you today Return as needed or in 6 months for diabetes follow up visit.

## 2024-01-01 NOTE — Assessment & Plan Note (Signed)
 Stable period off oral replacement.

## 2024-01-01 NOTE — Progress Notes (Signed)
 CORRECTION::The AWV questionnaire was completed on 12/24/23 by the patient

## 2024-01-01 NOTE — Assessment & Plan Note (Signed)
 Encourage healthy diet and lifestyle choices to affect sustainable weight loss

## 2024-01-01 NOTE — Assessment & Plan Note (Signed)
 Chronic, stable. Continue atorvastatin 20mg  daily. The ASCVD Risk score (Arnett DK, et al., 2019) failed to calculate for the following reasons:   The valid total cholesterol range is 130 to 320 mg/dL

## 2024-01-01 NOTE — Assessment & Plan Note (Signed)
Appreciate pain clinic care.  

## 2024-01-01 NOTE — Assessment & Plan Note (Signed)
 Chronic, BP above goal in office today however home readings largely well controlled. I did ask him to bring in home BP cuff to next visit to compare to ours

## 2024-01-01 NOTE — Assessment & Plan Note (Signed)
 Chronic, stable period on prozac  60mg  daily - continue.

## 2024-01-01 NOTE — Assessment & Plan Note (Addendum)
 Severe, has not tolerated CPAP Found to not be inspire candidate (BMI<32).

## 2024-01-01 NOTE — Progress Notes (Signed)
 Ph: [phone] Fax: [phone]   Patient ID: Mark [name], male    DOB: 01-23-1956, 68 y.o.   MRN: [id number]  This visit was conducted in person.  BP (!) 150/72   Pulse 71   Temp 98.4 F (36.9 C) (Oral)   Ht 5' 6.5" (1.689 m)   Wt 219 lb (99.3 kg)   SpO2 99%   BMI 34.82 kg/m   BP Readings from Last 3 Encounters:  01/01/24 (!) 150/72  10/30/23 106/72  10/29/23 (!) 141/75  Elevated on repeat testing 160/64  CC: CPE Subjective:   HPI: VEASNA [name] is a 68 y.o. male presenting on 01/01/2024 for Annual Exam (MCR prt 2 [AWV- 12/27/23]. Pt provided recent BP log [to keep].)   Saw health advisor last week for medicare wellness visit. Note reviewed.   No results found.  Flowsheet Row Office Visit from 01/01/2024 in [facility] [place]  PHQ-2 Total Score 0          01/01/2024    2:24 PM 12/24/2023    5:10 PM 10/29/2023    1:44 PM 07/25/2023    2:36 PM 05/17/2023    1:56 PM  Fall Risk   Falls in the past year? 0 0 0 0 0  Number falls in past yr:  0 0 0   Injury with Fall?  0 0 0   Risk for fall due to :  No Fall Risks     Follow up  Education provided;Falls prevention discussed        HTN - brings BP log as per below - he continues amlodipine  2.5mg  daily, lasix  20mg  daily. Losartan  stopped due to myalgias    DM - stable period on metformin  XR 500mg  daily. Ozempic  0.25mg  trial caused persistent vomiting.    Chronic pain - manages with opiate through pain clinic as well as naprosyn  500mg  daily with breakfast. Takes PPI with NSAID.    Chronically on prozac  60mg  total.    Kidney stones - has seen Dr Cherylene Corrente. On potasium citrate daily supplement.    S/p 2nd pacemaker 12/2017, sees cardiology Dr Carolynne Citron --> Londa Rival, last seen 10/2023.    Preventative: COLONOSCOPY 06/2023 - TA, diverticulosis, hemorrhoids, rpt 7 yrs ([facility])  Prostate cancer screening with nodule - sees urology Dr Cherylene Corrente, PSA through our office.  Lung cancer screening -  not eligible  Flu shot yearly  COVID vaccine Moderna 11/2019 x2, booster 07/2020, bivalent 04/2021.  Pneumovax 2007, prevnar-20 03/2022 Tdap 11/2009, 04/2020  Shingrix - 04/2022, 11/2022 Advanced directive discussion - working on this at home - HCPOA would be Daris Edman (lifelong friend). Asked to bring us  a copy when complete.  Seat belt use discussed  Sunscreen use discussed, no changing moles on skin.  Non smoker  Alcohol - none  Dentist q6 mo  Eye exam ~q2 yrs Bowel - no constipation Bladder - no incontinence   Divorced. GF passed away. Lives alone, mother lives next door. Edu: 8th grade Occ: retired, disability for chronic neck pain  Activity: no regular exercise - limited by back and knee pain Diet: increasing water, poor vegetables and fruits     Relevant past medical, surgical, family and social history reviewed and updated as indicated. Interim medical history since our last visit reviewed. Allergies and medications reviewed and updated. Outpatient Medications Prior to Visit  Medication Sig Dispense Refill   Ascorbic Acid (VITAMIN C) 1000 MG tablet Take 1,000 mg by mouth daily.  aspirin 81 MG tablet Take 81 mg by mouth daily.     B Complex-C (SUPER B COMPLEX/VITAMIN C PO) Take by mouth daily.     Cholecalciferol (VITAMIN D3 PO) Take 2,000 Int'l Units by mouth daily.     cyclobenzaprine  (FLEXERIL ) 10 MG tablet TAKE ONE-HALF TO ONE TABLET BY MOUTH 2 TIMES DAILY AS NEEDED FOR MUSCLE SPASMS 40 tablet 0   Garlic 100 MG TABS Take by mouth daily.     Multiple Vitamins-Minerals (CERTAVITE SENIOR/ANTIOXIDANT) TABS Take 1 tablet by mouth daily.     naloxone  (NARCAN ) nasal spray 4 mg/0.1 mL Place 1 spray into the nose as needed for up to 365 doses (for opioid-induced respiratory depresssion). In case of emergency (overdose), spray once into each nostril. If no response within 3 minutes, repeat application and call 911. 1 each 1   Omega-3 Fatty Acids (FISH OIL) 1000 MG CAPS Take by mouth  daily.     Oxycodone  HCl 10 MG TABS Take 1 tablet (10 mg total) by mouth every 8 (eight) hours as needed. Each refill must last 30 days. 90 tablet 0   potassium citrate  (UROCIT-K ) 10 MEQ (1080 MG) SR tablet TAKE 1 TABLET BY MOUTH TWICE DAILY 180 tablet 3   sildenafil  (REVATIO ) 20 MG tablet Take 20 mg by mouth. 3 -5 tablets as needed     amLODipine  (NORVASC ) 2.5 MG tablet TAKE 1 TABLET BY MOUTH DAILY (in place of losartan ) 90 tablet 3   atorvastatin  (LIPITOR) 20 MG tablet Take 1 tablet (20 mg total) by mouth at bedtime. 90 tablet 2   Cyanocobalamin  (VITAMIN B12 PO) Take by mouth daily.     diphenhydrAMINE  (BENADRYL  ALLERGY) 25 MG tablet Take 1 tablet (25 mg total) by mouth 2 (two) times daily as needed for allergies.     FLUoxetine  (PROZAC ) 20 MG capsule Take 1 capsule (20 mg total) by mouth daily. 90 capsule 2   FLUoxetine  (PROZAC ) 40 MG capsule Take 1 capsule (40 mg total) by mouth daily. 90 capsule 2   fluticasone  (FLONASE ) 50 MCG/ACT nasal spray Place 2 sprays into both nostrils daily. 48 g 3   furosemide  (LASIX ) 20 MG tablet Take 1 tablet (20 mg total) by mouth daily. 90 tablet 3   metFORMIN  (GLUCOPHAGE -XR) 500 MG 24 hr tablet Take 1 tablet (500 mg total) by mouth daily with breakfast. 90 tablet 3   naproxen  (NAPROSYN ) 500 MG tablet TAKE 1 TABLET BY MOUTH 2 TIMES DAILY AS NEEDED FOR MODERATE PAIN 90 tablet 0   omeprazole  (PRILOSEC) 20 MG capsule Take 1 capsule (20 mg total) by mouth daily. 90 capsule 3   rOPINIRole  (REQUIP ) 2 MG tablet Take 1 tablet (2 mg total) by mouth at bedtime. 90 tablet 3   Oxycodone  HCl 10 MG TABS Take 1 tablet (10 mg total) by mouth every 8 (eight) hours as needed. Each refill must last 30 days. 90 tablet 0   Oxycodone  HCl 10 MG TABS Take 1 tablet (10 mg total) by mouth every 8 (eight) hours as needed. Each refill must last 30 days. 90 tablet 0   No facility-administered medications prior to visit.     Per HPI unless specifically indicated in ROS section  below Review of Systems  Constitutional:  Negative for activity change, appetite change, chills, fatigue, fever and unexpected weight change.  HENT:  Negative for hearing loss.   Eyes:  Negative for visual disturbance.  Respiratory:  Negative for cough, chest tightness, shortness of breath and wheezing.  Cardiovascular:  Negative for chest pain, palpitations and leg swelling.  Gastrointestinal:  Negative for abdominal distention, abdominal pain, blood in stool, constipation, diarrhea, nausea and vomiting.  Genitourinary:  Negative for difficulty urinating and hematuria.  Musculoskeletal:  Negative for arthralgias, myalgias and neck pain.  Skin:  Negative for rash.  Neurological:  Negative for dizziness, seizures, syncope and headaches.  Hematological:  Negative for adenopathy. Does not bruise/bleed easily.  Psychiatric/Behavioral:  Negative for dysphoric mood. The patient is not nervous/anxious.     Objective:  BP (!) 150/72   Pulse 71   Temp 98.4 F (36.9 C) (Oral)   Ht 5' 6.5" (1.689 m)   Wt 219 lb (99.3 kg)   SpO2 99%   BMI 34.82 kg/m   Wt Readings from Last 3 Encounters:  01/01/24 219 lb (99.3 kg)  12/27/23 219 lb (99.3 kg)  10/30/23 219 lb (99.3 kg)      Physical Exam Vitals and nursing note reviewed.  Constitutional:      General: He is not in acute distress.    Appearance: Normal appearance. He is well-developed. He is not ill-appearing.  HENT:     Head: Normocephalic and atraumatic.     Right Ear: Hearing, tympanic membrane, ear canal and external ear normal.     Left Ear: Hearing, tympanic membrane, ear canal and external ear normal.     Mouth/Throat:     Mouth: Mucous membranes are moist.     Pharynx: Oropharynx is clear. No oropharyngeal exudate or posterior oropharyngeal erythema.  Eyes:     General: No scleral icterus.    Extraocular Movements: Extraocular movements intact.     Conjunctiva/sclera: Conjunctivae normal.     Pupils: Pupils are equal, round,  and reactive to light.  Neck:     Thyroid : No thyroid  mass or thyromegaly.     Vascular: No carotid bruit.  Cardiovascular:     Rate and Rhythm: Normal rate and regular rhythm.     Pulses: Normal pulses.          Radial pulses are 2+ on the right side and 2+ on the left side.     Heart sounds: Normal heart sounds. No murmur heard. Pulmonary:     Effort: Pulmonary effort is normal. No respiratory distress.     Breath sounds: Normal breath sounds. No wheezing, rhonchi or rales.  Abdominal:     General: Bowel sounds are normal. There is no distension.     Palpations: Abdomen is soft. There is no mass.     Tenderness: There is no abdominal tenderness. There is no guarding or rebound.     Hernia: No hernia is present.  Musculoskeletal:        General: Normal range of motion.     Cervical back: Normal range of motion and neck supple.     Right lower leg: No edema.     Left lower leg: No edema.  Lymphadenopathy:     Cervical: No cervical adenopathy.  Skin:    General: Skin is warm and dry.     Findings: No rash.  Neurological:     General: No focal deficit present.     Mental Status: He is alert and oriented to person, place, and time.  Psychiatric:        Mood and Affect: Mood normal.        Behavior: Behavior normal.        Thought Content: Thought content normal.        Judgment:  Judgment normal.       Results for orders placed or performed in visit on 01/01/24  Lab report - scanned   Collection Time: 12/18/23 12:00 AM  Result Value Ref Range   EGFR 90.0    Creatinine, POC 0.73 mg/dL   Lab Results  Component Value Date   VITAMINB12 1,303 (H) 12/25/2023   Lab Results  Component Value Date   NA 140 12/25/2023   CL 103 12/25/2023   K 3.9 12/25/2023   CO2 27 12/25/2023   BUN 29 (H) 12/25/2023   CREATININE 0.93 12/25/2023   GFR 85.29 12/25/2023   CALCIUM  9.1 12/25/2023   ALBUMIN 4.0 12/25/2023   GLUCOSE 119 (H) 12/25/2023   Lab Results  Component Value Date    HGBA1C 5.4 12/25/2023    Assessment & Plan:   Problem List Items Addressed This Visit     Chronic pain syndrome (Chronic)   Appreciate pain clinic care.       Relevant Medications   FLUoxetine  (PROZAC ) 20 MG capsule   FLUoxetine  (PROZAC ) 40 MG capsule   naproxen  (NAPROSYN ) 500 MG tablet   Cardiac pacemaker in situ (Chronic)   Health maintenance examination - Primary (Chronic)   Preventative protocols reviewed and updated unless pt declined. Discussed healthy diet and lifestyle.       Advanced directives, counseling/discussion (Chronic)   Asked to bring us  copy when notarized/completed.       MDD (major depressive disorder), recurrent episode, moderate (HCC)   Chronic, stable period on prozac  60mg  daily - continue.       Relevant Medications   FLUoxetine  (PROZAC ) 20 MG capsule   FLUoxetine  (PROZAC ) 40 MG capsule   Hyperlipidemia associated with type 2 diabetes mellitus (HCC)   Chronic, stable. Continue atorvastatin  20mg  daily.  The ASCVD Risk score (Arnett DK, et al., 2019) failed to calculate for the following reasons:   The valid total cholesterol range is 130 to 320 mg/dL       Relevant Medications   amLODipine  (NORVASC ) 2.5 MG tablet   atorvastatin  (LIPITOR) 20 MG tablet   furosemide  (LASIX ) 20 MG tablet   metFORMIN  (GLUCOPHAGE -XR) 500 MG 24 hr tablet   Obesity, Class I, BMI 30.0-34.9 (see actual BMI)   Encourage healthy diet and lifestyle choices to affect sustainable weight loss.       Hypertension   Chronic, BP above goal in office today however home readings largely well controlled. I did ask him to bring in home BP cuff to next visit to compare to ours      Relevant Medications   amLODipine  (NORVASC ) 2.5 MG tablet   atorvastatin  (LIPITOR) 20 MG tablet   furosemide  (LASIX ) 20 MG tablet   Nephrolithiasis   Recurrent kidney stones now on K citrate - continue this.       OSA (obstructive sleep apnea)   Severe, has not tolerated CPAP Found to not be  inspire candidate (BMI<32).       RLS (restless legs syndrome)   Continues requip  2mg  nightly. Discussed dosing 2 hours prior to bedtime.       Relevant Medications   rOPINIRole  (REQUIP ) 2 MG tablet   GERD (gastroesophageal reflux disease)   Chronic, stable period on omeprazole  20mg  daily.       Relevant Medications   omeprazole  (PRILOSEC) 20 MG capsule   IDA (iron deficiency anemia)   Stable period off oral replacement.       Uncomplicated opioid dependence (HCC)   Type 2 diabetes mellitus with  other specified complication (HCC)   Chronic, stable on current regimen - continue.      Relevant Medications   atorvastatin  (LIPITOR) 20 MG tablet   metFORMIN  (GLUCOPHAGE -XR) 500 MG 24 hr tablet   Other Visit Diagnoses       Seasonal allergies       Relevant Medications   fluticasone  (FLONASE ) 50 MCG/ACT nasal spray        Meds ordered this encounter  Medications   amLODipine  (NORVASC ) 2.5 MG tablet    Sig: TAKE 1 TABLET BY MOUTH DAILY (in place of losartan )    Dispense:  90 tablet    Refill:  4   atorvastatin  (LIPITOR) 20 MG tablet    Sig: Take 1 tablet (20 mg total) by mouth at bedtime.    Dispense:  90 tablet    Refill:  4   FLUoxetine  (PROZAC ) 20 MG capsule    Sig: Take 1 capsule (20 mg total) by mouth daily.    Dispense:  90 capsule    Refill:  4   FLUoxetine  (PROZAC ) 40 MG capsule    Sig: Take 1 capsule (40 mg total) by mouth daily.    Dispense:  90 capsule    Refill:  4   fluticasone  (FLONASE ) 50 MCG/ACT nasal spray    Sig: Place 2 sprays into both nostrils daily.    Dispense:  48 g    Refill:  4   furosemide  (LASIX ) 20 MG tablet    Sig: Take 1 tablet (20 mg total) by mouth daily.    Dispense:  90 tablet    Refill:  4   metFORMIN  (GLUCOPHAGE -XR) 500 MG 24 hr tablet    Sig: Take 1 tablet (500 mg total) by mouth daily with breakfast.    Dispense:  90 tablet    Refill:  4   naproxen  (NAPROSYN ) 500 MG tablet    Sig: Take 1 tablet (500 mg total) by mouth  daily as needed for moderate pain (pain score 4-6).    Dispense:  90 tablet    Refill:  0   omeprazole  (PRILOSEC) 20 MG capsule    Sig: Take 1 capsule (20 mg total) by mouth daily.    Dispense:  90 capsule    Refill:  4   rOPINIRole  (REQUIP ) 2 MG tablet    Sig: Take 1 tablet (2 mg total) by mouth at bedtime.    Dispense:  90 tablet    Refill:  4   diphenhydrAMINE  (BENADRYL  ALLERGY) 25 MG tablet    Sig: Take 1 tablet (25 mg total) by mouth at bedtime as needed for allergies or sleep.    No orders of the defined types were placed in this encounter.   Patient Instructions  Happy early bday!  Continue current medicines Bring me copy of advanced directive when complete.  Good to see you today Return as needed or in 6 months for diabetes follow up visit.   Follow up plan: Return in about 6 months (around 07/03/2024) for follow up visit.  Claire Crick, MD

## 2024-01-01 NOTE — Assessment & Plan Note (Signed)
 Chronic, stable on current regimen - continue.

## 2024-01-01 NOTE — Assessment & Plan Note (Signed)
Chronic, stable period on omeprazole 20mg  daily

## 2024-01-15 ENCOUNTER — Other Ambulatory Visit: Payer: Self-pay | Admitting: Family Medicine

## 2024-01-15 DIAGNOSIS — E1169 Type 2 diabetes mellitus with other specified complication: Secondary | ICD-10-CM

## 2024-01-15 NOTE — Telephone Encounter (Signed)
 Too soon. Rx sent 01/01/24, #90/4 refills to [facility] mail order.   Request denied.

## 2024-01-23 ENCOUNTER — Encounter: Admitting: Pain Medicine

## 2024-01-23 ENCOUNTER — Encounter: Payer: Self-pay | Admitting: Nurse Practitioner

## 2024-01-23 ENCOUNTER — Ambulatory Visit: Attending: Nurse Practitioner | Admitting: Nurse Practitioner

## 2024-01-23 DIAGNOSIS — M47816 Spondylosis without myelopathy or radiculopathy, lumbar region: Secondary | ICD-10-CM | POA: Insufficient documentation

## 2024-01-23 DIAGNOSIS — G894 Chronic pain syndrome: Secondary | ICD-10-CM | POA: Insufficient documentation

## 2024-01-23 DIAGNOSIS — M25511 Pain in right shoulder: Secondary | ICD-10-CM | POA: Diagnosis not present

## 2024-01-23 DIAGNOSIS — G8929 Other chronic pain: Secondary | ICD-10-CM | POA: Insufficient documentation

## 2024-01-23 DIAGNOSIS — G4486 Cervicogenic headache: Secondary | ICD-10-CM | POA: Insufficient documentation

## 2024-01-23 DIAGNOSIS — M533 Sacrococcygeal disorders, not elsewhere classified: Secondary | ICD-10-CM | POA: Diagnosis not present

## 2024-01-23 DIAGNOSIS — M25512 Pain in left shoulder: Secondary | ICD-10-CM | POA: Diagnosis not present

## 2024-01-23 DIAGNOSIS — Z79891 Long term (current) use of opiate analgesic: Secondary | ICD-10-CM | POA: Insufficient documentation

## 2024-01-23 DIAGNOSIS — M545 Low back pain, unspecified: Secondary | ICD-10-CM | POA: Insufficient documentation

## 2024-01-23 DIAGNOSIS — Z79899 Other long term (current) drug therapy: Secondary | ICD-10-CM | POA: Diagnosis not present

## 2024-01-23 DIAGNOSIS — M542 Cervicalgia: Secondary | ICD-10-CM | POA: Diagnosis not present

## 2024-01-23 DIAGNOSIS — M5412 Radiculopathy, cervical region: Secondary | ICD-10-CM | POA: Insufficient documentation

## 2024-01-23 DIAGNOSIS — M961 Postlaminectomy syndrome, not elsewhere classified: Secondary | ICD-10-CM | POA: Diagnosis not present

## 2024-01-23 MED ORDER — OXYCODONE HCL 10 MG PO TABS: 10.0000 mg | ORAL_TABLET | Freq: Three times a day (TID) | ORAL | 0 refills | Status: DC | PRN

## 2024-01-23 NOTE — Progress Notes (Signed)
 PROVIDER NOTE: Interpretation of information contained herein should be left to medically-trained personnel. Specific patient instructions are provided elsewhere under Patient Instructions section of medical record. This document was created in part using AI and STT-dictation technology, any transcriptional errors that may result from this process are unintentional.  Patient: Mark [name]  Service: E/M   PCP: Mark Crick, MD  DOB: 1955-12-08  DOS: 01/23/2024  Provider: Cherylin Corrigan, NP  MRN: [id number]  Delivery: Face-to-face  Specialty: Interventional Pain Management  Type: Established Patient  Setting: Ambulatory outpatient facility  Specialty designation: 09  Referring Prov.: Mark Crick, MD  Location: Outpatient office facility       History of present illness (HPI) [name], a 68 y.o. year old male, is here today because of his No primary diagnosis found.. 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  Pain Assessment: Severity of Chronic pain is reported as a 10-Worst pain ever/10. Location: Shoulder Left/denies. Onset: More than a month ago. Quality: Aching, Dull, Sharp. Timing: Intermittent. Modifying factor(s): meds, staying off feet. Vitals:  height is 5' 7 (1.702 m) and weight is 215 lb (97.5 kg). His temperature is 97 F (36.1 C) (abnormal). His blood pressure is 133/82 and his pulse is 81. His oxygen saturation is 100%.  BMI: Estimated body mass index is 33.67 kg/m as calculated from the following:   Height as of this  encounter: 5' 7 (1.702 m).   Weight as of this encounter: 215 lb (97.5 kg).  Last encounter: 10/25/2023 Last procedure: Visit date not found.  Reason for encounter: medication management.  The patient indicates doing well with current medication regimen.  No adverse reaction or side effects reported to medication.  He experiences pain primarily in his left shoulder which worsen with weather changes.  The pain significantly impacts daily activities.   Pharmacotherapy Assessment   Analgesic: Oxycodone  HCl 10 mg by mouth every 8 hours as needed for pain. MME=45 Monitoring: [place] PMP: PDMP reviewed during this encounter.       Pharmacotherapy: No side-effects or adverse reactions reported. Compliance: No problems identified. Effectiveness: Clinically acceptable.  Mark Staple, RN  01/23/2024 12:58 PM  Sign when Signing Visit Safety precautions to be maintained throughout the outpatient stay will include: orient to surroundings, keep bed in low position, maintain call bell within reach at all times, provide assistance with transfer out of bed and ambulation.   Nursing Pain Medication Assessment:  Safety precautions to be maintained throughout the outpatient stay will include: orient to surroundings, keep bed in low position, maintain call bell within reach at all times, provide assistance with transfer out of bed and ambulation.  Medication Inspection Compliance: Pill count conducted under aseptic conditions, in front of the patient. Neither the pills nor the bottle was removed from the patient's sight at any time. Once count was completed pills were immediately returned to the patient in their original bottle.  Medication: Morphine  IR Pill/Patch Count: 40 of 90 pills/patches remain Pill/Patch Appearance: Markings consistent with prescribed medication Bottle Appearance: Standard pharmacy container. Clearly labeled. Filled Date: 5 / 19 / 2025 Last Medication intake:  Today  UDS:  Summary  Date Value Ref Range Status  01/29/2023 Note  Final    Comment:    ==================================================================== ToxASSURE Select 13 (MW) ==================================================================== Test                             Result       Flag       Units  Drug Present   Oxycodone                       1796                    ng/mg creat   Oxymorphone                    153                     ng/mg creat   Noroxycodone                   4143                    ng/mg creat    Sources of oxycodone  include scheduled prescription medications.    Oxymorphone and noroxycodone are expected metabolites of oxycodone .    Oxymorphone is also available as a scheduled prescription medication.  ==================================================================== Test                      Result    Flag   Units      Ref Range   Creatinine              68               mg/dL      >=16 ==================================================================== Declared Medications:  Medication list was not provided. ==================================================================== For clinical consultation, please call [phone]. ====================================================================     No results found for: CBDTHCR No results found for: D8THCCBX No results found for: D9THCCBX  ROS  Constitutional: Denies any fever or chills Gastrointestinal: No reported hemesis, hematochezia, vomiting, or acute GI distress Musculoskeletal: Left shoulder pain Neurological: No reported episodes of acute onset apraxia, aphasia, dysarthria, agnosia, amnesia, paralysis, loss of coordination, or loss of consciousness  Medication Review  B Complex-C, CertaVite Senior/Antioxidant, Cholecalciferol, FLUoxetine , Fish Oil, Garlic, Oxycodone  HCl, amLODipine , aspirin, atorvastatin , cyclobenzaprine , diphenhydrAMINE , fluticasone , furosemide , metFORMIN , naloxone ,  naproxen , omeprazole , potassium citrate , rOPINIRole , sildenafil , and vitamin C  History Review  Allergy: Mark [name] is allergic to ciprofloxacin , losartan , metformin , buprenorphine hcl, morphine  and codeine, and penicillins. Drug: Mr. Dombek  reports no history of drug use. Alcohol:  reports no history of alcohol use. Tobacco:  reports that he has never smoked. He has been exposed to tobacco smoke. He has never used smokeless tobacco. Social: Mr. Pasion  reports that he has never smoked. He has been exposed to tobacco smoke. He has never used smokeless tobacco. He reports that he does not drink alcohol and does not use drugs. Medical:  has a past medical history of Allergy, Anxiety, Arthritis, Chronic pain, Depression, Diabetes mellitus without complication (HCC), Heart murmur, History of kidney stones (11/17/2015), Hydrocele in adult (05/07/2015), Hypertension, Kidney stones, MRSA carrier (2019), Neuromuscular disorder (HCC), Presence of permanent cardiac pacemaker, Prostatitis, and Sleep apnea. Surgical: Mr. Brisk  has a past surgical history that includes Tonsillectomy (1964); Carpal tunnel release (Bilateral, 2006); Ulnar nerve repair (2008); Cervical spine surgery (2008); Rotator cuff repair (  Left, 2009); pacemaker placement (2009); Lithotripsy (Bilateral, 2012); Lithotripsy (Left, 11/2015); Cardiovascular stress test (2014); Colonoscopy (07/2007); implantable cardioverter defibrillator (icd) generator change (Left, 12/13/2017); pace maker revision (12/13/2017); Laparoscopic cholecystectomy (10/2020); Cholecystectomy (N/A, 10/27/2020); ERCP (N/A, 10/26/2020); Esophagogastroduodenoscopy (egd) with propofol  (N/A, 10/26/2020); EUS (10/26/2020); Sphincterotomy (10/26/2020); removal of stones (10/26/2020); Colonoscopy with propofol  (N/A, 06/14/2023); polypectomy (06/14/2023); and Spine surgery (08/21/2006 at [facility]). Family: family history includes Anxiety disorder in his mother; Arthritis in his  father and mother; Cancer in his father and mother; Depression in his mother; Diabetes in his mother; Hearing loss in his father and mother; Heart block in his father; Heart disease in his father and mother; Hyperlipidemia in his mother; Hypertension in his mother; Kidney disease in his mother; Obesity in his mother; Stroke in his mother; Vision loss in his father and mother.  Laboratory Chemistry Profile   Renal Lab Results  Component Value Date   BUN 29 (H) 12/25/2023   CREATININE 0.93 12/25/2023   GFR 85.29 12/25/2023   GFRAA >60 12/06/2017   GFRNONAA >60 10/29/2020    Hepatic Lab Results  Component Value Date   AST 25 12/25/2023   ALT 27 12/25/2023   ALBUMIN 4.0 12/25/2023   ALKPHOS 58 12/25/2023   HCVAB NEGATIVE 07/22/2013   LIPASE 39 10/28/2020    Electrolytes Lab Results  Component Value Date   NA 140 12/25/2023   K 3.9 12/25/2023   CL 103 12/25/2023   CALCIUM  9.1 12/25/2023   MG 2.1 09/01/2015    Bone Lab Results  Component Value Date   25OHVITD1 63 02/02/2016   25OHVITD2 <1.0 02/02/2016   25OHVITD3 63 02/02/2016   TESTOSTERONE  495 08/08/2017    Inflammation (CRP: Acute Phase) (ESR: Chronic Phase) Lab Results  Component Value Date   CRP 1.7 (H) 09/01/2015   ESRSEDRATE 6 09/01/2015         Note: Above Lab results reviewed.  Recent Imaging Review  CUP PACEART REMOTE DEVICE CHECK PPM Scheduled remote reviewed. Normal device function.  Presenting rhythm: AP/VS. No new episodes since VT event routed to clinic on 12/16/23.  Next remote 91 days. MC, CVRS Note: Reviewed        Physical Exam  General appearance: Well nourished, well developed, and well hydrated. In no apparent acute distress Mental status: Alert, oriented x 3 (person, place, & time)       Respiratory: No evidence of acute respiratory distress Eyes: PERLA Vitals: BP 133/82   Pulse 81   Temp (!) 97 F (36.1 C)   Ht 5' 7 (1.702 m)   Wt 215 lb (97.5 kg)   SpO2 100%   BMI 33.67 kg/m   BMI: Estimated body mass index is 33.67 kg/m as calculated from the following:   Height as of this encounter: 5' 7 (1.702 m).   Weight as of this encounter: 215 lb (97.5 kg). Ideal: Ideal body weight: 66.1 kg (145 lb 11.6 oz) Adjusted ideal body weight: 78.7 kg (173 lb 6.9 oz)  Assessment   Diagnosis Status  1. Cervicalgia   2. Chronic neck pain (1ry area of Pain) (Bilateral) (L>R)   3. Chronic sacroiliac joint pain (Bilateral)   4. Cervicogenic headache   5. 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 Lumbar facet syndrome (Bilateral)   7. Chronic shoulder pain (Bilateral) (L>R)   8. Chronic cervical radicular pain   9. Chronic low back pain (Bilateral) w/o sciatica   10. Chronic pain syndrome   11. Pharmacologic  therapy   12. Chronic use of opiate for therapeutic purpose   13. Encounter for medication management   14. Encounter for chronic pain management    Controlled Controlled Controlled   Updated Problems: No problems updated.  Plan of Care  Problem-specific:  Assessment and Plan We will continue on current medication regimen.  Prescribing drug monitoring (PDMP) reviewed; findings consistent with the use of prescribed medication and no evidence of narcotic misuse or abuse. Routine UDS ordered today.  Schedule follow-up in 90 days for medication management. No new issues or problems reported to this visit.   Interventional Therapies  Risk  Complexity Considerations:   WNL    Planned  Pending:   Diagnostic left IA shoulder joint injection #1 vs left suprascapular nerve block #1     Under consideration:   Diagnostic right hand digit 2 trigger finger injection Therapeutic bilateral IA Hyalgan knee injections  Diagnostic bilateral cervical facet block  Possible bilateral cervical facet RFA  Diagnostic left sided CESI  Diagnostic bilateral IA knee injection  Diagnostic bilateral genicular NB  Possible bilateral genicular nerve RFA   Diagnostic bilateral sacroiliac joint block  Possible bilateral sacroiliac joint RFA  Diagnostic bilateral IA shoulder joint injection  Diagnostic bilateral suprascapular NB  Possible bilateral suprascapular nerve RFA  Diagnostic bilateral lumbar facet block  Possible bilateral lumbar facet RFA  Diagnostic left L4-5 LESI     Completed:   Therapeutic left levator scapula, supraspinatus, and rhomboid muscle TPI/MNB x1 (03/30/2020) (100/100/85/75)  Diagnostic left C7-T1 cervical ESI x1 (06/21/2016) (90/90/70/75)  Diagnostic bilateral IA Hyalgan knee inj. x3 (06/21/2016) (left: 100/90/100/90/75) (right: 90/90/80/75)  Diagnostic/therapeutic right thumb inj. x1 (05/23/2016) (n/a) Diagnostic/therapeutic left thumb (CMC-Trapezium) inj. x1 (03/11/2020) (100/100/75/75)     Therapeutic  Palliative (PRN) options:   Therapeutic IA Hyalgan knee injections    Mr. THEDORE PICKEL has a current medication list which includes the following long-term medication(s): amlodipine , atorvastatin , diphenhydramine , fluoxetine , fluoxetine , fluticasone , furosemide , metformin , omeprazole , ropinirole , [START ON 02/01/2024] oxycodone  hcl, [START ON 03/02/2024] oxycodone  hcl, and [START ON 04/01/2024] oxycodone  hcl.  Pharmacotherapy (Medications Ordered): Meds ordered this encounter  Medications   Oxycodone  HCl 10 MG TABS    Sig: Take 1 tablet (10 mg total) by mouth every 8 (eight) hours as needed. Each refill must last 30 days.    Dispense:  90 tablet    Refill:  0    DO NOT: delete (not duplicate); no partial-fill (will deny script to complete), no refill request (F/U required). DISPENSE: 1 day early if closed on fill date. WARN: No CNS-depressants within 8 hrs of med.   Oxycodone  HCl 10 MG TABS    Sig: Take 1 tablet (10 mg total) by mouth every 8 (eight) hours as needed. Each refill must last 30 days.    Dispense:  90 tablet    Refill:  0    DO NOT: delete (not duplicate); no partial-fill (will deny script to  complete), no refill request (F/U required). DISPENSE: 1 day early if closed on fill date. WARN: No CNS-depressants within 8 hrs of med.   Oxycodone  HCl 10 MG TABS    Sig: Take 1 tablet (10 mg total) by mouth every 8 (eight) hours as needed. Each refill must last 30 days.    Dispense:  90 tablet    Refill:  0    DO NOT: delete (not duplicate); no partial-fill (will deny script to complete), no refill request (F/U required). DISPENSE: 1 day early if closed on fill date.  WARN: No CNS-depressants within 8 hrs of med.   Orders:  Orders Placed This Encounter  Procedures   ToxASSURE Select 13 (MW), Urine    Volume: 30 ml(s). Minimum 3 ml of urine is needed. Document temperature of fresh sample. Indications: Long term (current) use of opiate analgesic (Z61.096)    Release to patient:   Immediate        Return in about 3 months (around 04/24/2024) for (F2F), (MM), Marthe Slain NP.    Recent Visits Date Type Provider Dept  10/29/23 Office Visit Renaldo Caroli, MD [facility]  Showing recent visits within past 90 days and meeting all other requirements Today's Visits Date Type Provider Dept  01/23/24 Office Visit Rayne Loiseau K, NP [facility]  Showing today's visits and meeting all other requirements Future Appointments Date Type Provider Dept  04/17/24 Appointment Andre Swander K, NP [facility]  Showing future appointments within next 90 days and meeting all other requirements  I discussed the assessment and treatment plan with the patient. The patient was provided an opportunity to ask questions and all were answered. The patient agreed with the plan and demonstrated an understanding of the instructions.  Patient advised to call back or seek an in-person evaluation if the symptoms or condition worsens.  Duration of encounter: 30 minutes.  Total time on encounter, as per AMA guidelines included both the face-to-face and non-face-to-face time personally  spent by the physician and/or other qualified health care professional(s) on the day of the encounter (includes time in activities that require the physician or other qualified health care professional and does not include time in activities normally performed by clinical staff). Physician's time may include the following activities when performed: Preparing to see the patient (e.g., pre-charting review of records, searching for previously ordered imaging, lab work, and nerve conduction tests) Review of prior analgesic pharmacotherapies. Reviewing PMP Interpreting ordered tests (e.g., lab work, imaging, nerve conduction tests) Performing post-procedure evaluations, including interpretation of diagnostic procedures Obtaining and/or reviewing separately obtained history Performing a medically appropriate examination and/or evaluation Counseling and educating the patient/family/caregiver Ordering medications, tests, or procedures Referring and communicating with other health care professionals (when not separately reported) Documenting clinical information in the electronic or other health record Independently interpreting results (not separately reported) and communicating results to the patient/ family/caregiver Care coordination (not separately reported)  Note by: Gustave Lindeman K Corderro Koloski, NP (TTS and AI technology used. I apologize for any typographical errors that were not detected and corrected.) Date: 01/23/2024; Time: 1:50 PM

## 2024-01-23 NOTE — Progress Notes (Signed)
 Safety precautions to be maintained throughout the outpatient stay will include: orient to surroundings, keep bed in low position, maintain call bell within reach at all times, provide assistance with transfer out of bed and ambulation.   Nursing Pain Medication Assessment:  Safety precautions to be maintained throughout the outpatient stay will include: orient to surroundings, keep bed in low position, maintain call bell within reach at all times, provide assistance with transfer out of bed and ambulation.  Medication Inspection Compliance: Pill count conducted under aseptic conditions, in front of the patient. Neither the pills nor the bottle was removed from the patient's sight at any time. Once count was completed pills were immediately returned to the patient in their original bottle.  Medication: Morphine  IR Pill/Patch Count: 40 of 90 pills/patches remain Pill/Patch Appearance: Markings consistent with prescribed medication Bottle Appearance: Standard pharmacy container. Clearly labeled. Filled Date: 5 / 26 / 2025 Last Medication intake:  Today

## 2024-01-26 ENCOUNTER — Other Ambulatory Visit: Payer: Self-pay | Admitting: Family Medicine

## 2024-01-26 DIAGNOSIS — F331 Major depressive disorder, recurrent, moderate: Secondary | ICD-10-CM

## 2024-01-28 MED ORDER — FLUOXETINE HCL 20 MG PO CAPS
20.0000 mg | ORAL_CAPSULE | Freq: Every day | ORAL | 4 refills | Status: AC
Start: 2024-01-28 — End: ?

## 2024-01-28 NOTE — Telephone Encounter (Signed)
 Year supply sent in last month.  Will refill again this month.

## 2024-01-31 LAB — TOXASSURE SELECT 13 (MW), URINE

## 2024-02-04 NOTE — Progress Notes (Signed)
 Remote pacemaker transmission.

## 2024-02-26 DIAGNOSIS — K080 Exfoliation of teeth due to systemic causes: Secondary | ICD-10-CM | POA: Diagnosis not present

## 2024-03-04 DIAGNOSIS — K080 Exfoliation of teeth due to systemic causes: Secondary | ICD-10-CM | POA: Diagnosis not present

## 2024-03-18 ENCOUNTER — Ambulatory Visit (INDEPENDENT_AMBULATORY_CARE_PROVIDER_SITE_OTHER): Payer: Medicare Other

## 2024-03-18 DIAGNOSIS — I495 Sick sinus syndrome: Secondary | ICD-10-CM

## 2024-03-19 LAB — CUP PACEART REMOTE DEVICE CHECK
Battery Remaining Longevity: 78 mo
Battery Voltage: 2.98 V
Brady Statistic AP VP Percent: 1.04 %
Brady Statistic AP VS Percent: 97.52 %
Brady Statistic AS VP Percent: 0 %
Brady Statistic AS VS Percent: 1.44 %
Brady Statistic RA Percent Paced: 99.72 %
Brady Statistic RV Percent Paced: 1.04 %
Date Time Interrogation Session: 20250811211609
Implantable Lead Connection Status: 753985
Implantable Lead Connection Status: 753985
Implantable Lead Implant Date: 20190509
Implantable Lead Implant Date: 20190509
Implantable Lead Location: 753859
Implantable Lead Location: 753862
Implantable Lead Model: 5092
Implantable Lead Model: 5592
Implantable Pulse Generator Implant Date: 20190509
Lead Channel Impedance Value: 380 Ohm
Lead Channel Impedance Value: 380 Ohm
Lead Channel Impedance Value: 570 Ohm
Lead Channel Impedance Value: 589 Ohm
Lead Channel Pacing Threshold Amplitude: 0.875 V
Lead Channel Pacing Threshold Amplitude: 1.25 V
Lead Channel Pacing Threshold Pulse Width: 0.4 ms
Lead Channel Pacing Threshold Pulse Width: 0.4 ms
Lead Channel Sensing Intrinsic Amplitude: 1.625 mV
Lead Channel Sensing Intrinsic Amplitude: 1.625 mV
Lead Channel Sensing Intrinsic Amplitude: 6.875 mV
Lead Channel Sensing Intrinsic Amplitude: 6.875 mV
Lead Channel Setting Pacing Amplitude: 2 V
Lead Channel Setting Pacing Amplitude: 2.5 V
Lead Channel Setting Pacing Pulse Width: 0.4 ms
Lead Channel Setting Sensing Sensitivity: 2 mV
Zone Setting Status: 755011
Zone Setting Status: 755011

## 2024-03-20 ENCOUNTER — Ambulatory Visit: Payer: Self-pay | Admitting: Internal Medicine

## 2024-03-31 ENCOUNTER — Other Ambulatory Visit: Payer: Self-pay | Admitting: Family Medicine

## 2024-03-31 DIAGNOSIS — G894 Chronic pain syndrome: Secondary | ICD-10-CM

## 2024-04-01 NOTE — Telephone Encounter (Signed)
 Naproxen  Last filled:  01/07/24, #90 Last OV:  01/01/24, CPE Next OV:  none

## 2024-04-16 ENCOUNTER — Ambulatory Visit: Attending: Internal Medicine | Admitting: Internal Medicine

## 2024-04-16 VITALS — BP 120/62 | HR 74 | Ht 67.0 in | Wt 218.0 lb

## 2024-04-16 DIAGNOSIS — I495 Sick sinus syndrome: Secondary | ICD-10-CM | POA: Diagnosis not present

## 2024-04-16 LAB — CUP PACEART INCLINIC DEVICE CHECK
Date Time Interrogation Session: 20250910173121
Implantable Lead Connection Status: 753985
Implantable Lead Connection Status: 753985
Implantable Lead Implant Date: 20190509
Implantable Lead Implant Date: 20190509
Implantable Lead Location: 753859
Implantable Lead Location: 753860
Implantable Lead Model: 5092
Implantable Lead Model: 5592
Implantable Pulse Generator Implant Date: 20190509

## 2024-04-16 NOTE — Progress Notes (Signed)
 HPI Mr .Demartini is referred for evaluation and management of his PPM. He has sinus node dysfunction and had followed at [facility]. He moved to [place] after divorcing his wife. He also has DM and HTN. He feels well. He admits to dietary indiscretion.  Allergies  Allergen Reactions   Ciprofloxacin  Other (See Comments)    PANIC ATTACKS (Fluoroquinolones)    Losartan  Other (See Comments)    Muscle pain, difficulty sleeping, anxiety, nausea   Metformin  Other (See Comments)    Bad acid reflux with short acting (IR)   Buprenorphine Hcl Nausea Only   Morphine  And Codeine Nausea Only   Penicillins Rash    Rash Has patient had a PCN reaction causing immediate rash, facial/tongue/throat swelling, SOB or lightheadedness with hypotension: Unknown Has patient had a PCN reaction causing severe rash involving mucus membranes or skin necrosis: Unknown Has patient had a PCN reaction that required hospitalization: No Has patient had a PCN reaction occurring within the last 10 years: No CHILDHOOD REACTION. If all of the above answers are NO, then may proceed with Cephalosporin use.      Current Outpatient Medications  Medication Sig Dispense Refill   amLODipine  (NORVASC ) 2.5 MG tablet TAKE 1 TABLET BY MOUTH DAILY (in place of losartan ) 90 tablet 4   Ascorbic Acid (VITAMIN C) 1000 MG tablet Take 1,000 mg by mouth daily.     aspirin 81 MG tablet Take 81 mg by mouth daily.     atorvastatin  (LIPITOR) 20 MG tablet Take 1 tablet (20 mg total) by mouth at bedtime. 90 tablet 4   B Complex-C (SUPER B COMPLEX/VITAMIN C PO) Take by mouth daily.     Cholecalciferol (VITAMIN D3 PO) Take 2,000 Int'l Units by mouth daily.     cyclobenzaprine  (FLEXERIL ) 10 MG tablet TAKE ONE-HALF TO ONE TABLET BY MOUTH 2 TIMES DAILY AS NEEDED FOR MUSCLE SPASMS 40 tablet 0   diphenhydrAMINE  (BENADRYL  ALLERGY) 25 MG tablet Take 1 tablet (25 mg total) by mouth at bedtime as needed for allergies or sleep.     FLUoxetine  (PROZAC ) 20 MG  capsule Take 1 capsule (20 mg total) by mouth daily. Total 60mg  daily 90 capsule 4   FLUoxetine  (PROZAC ) 40 MG capsule Take 1 capsule (40 mg total) by mouth daily. Total 60mg  daily 90 capsule 4   fluticasone  (FLONASE ) 50 MCG/ACT nasal spray Place 2 sprays into both nostrils daily. 48 g 4   furosemide  (LASIX ) 20 MG tablet Take 1 tablet (20 mg total) by mouth daily. 90 tablet 4   Garlic 100 MG TABS Take by mouth daily.     metFORMIN  (GLUCOPHAGE -XR) 500 MG 24 hr tablet Take 1 tablet (500 mg total) by mouth daily with breakfast. 90 tablet 4   Multiple Vitamins-Minerals (CERTAVITE SENIOR/ANTIOXIDANT) TABS Take 1 tablet by mouth daily.     naloxone  (NARCAN ) nasal spray 4 mg/0.1 mL Place 1 spray into the nose as needed for up to 365 doses (for opioid-induced respiratory depresssion). In case of emergency (overdose), spray once into each nostril. If no response within 3 minutes, repeat application and call 911. 1 each 1   naproxen  (NAPROSYN ) 500 MG tablet TAKE 1 TABLET BY MOUTH DAILY AS NEEDED FOR MODERATE PAIN(PAIN SCORE 4 TO 6) 90 tablet 0   Omega-3 Fatty Acids (FISH OIL) 1000 MG CAPS Take by mouth daily.     omeprazole  (PRILOSEC) 20 MG capsule Take 1 capsule (20 mg total) by mouth daily. 90 capsule 4   Oxycodone   HCl 10 MG TABS Take 1 tablet (10 mg total) by mouth every 8 (eight) hours as needed. Each refill must last 30 days. 90 tablet 0   potassium citrate  (UROCIT-K ) 10 MEQ (1080 MG) SR tablet TAKE 1 TABLET BY MOUTH TWICE DAILY 180 tablet 3   rOPINIRole  (REQUIP ) 2 MG tablet Take 1 tablet (2 mg total) by mouth at bedtime. 90 tablet 4   sildenafil  (REVATIO ) 20 MG tablet Take 20 mg by mouth. 3 -5 tablets as needed     No current facility-administered medications for this visit.     Past Medical History:  Diagnosis Date   Allergy    Anxiety    Arthritis    Chronic pain    Depression    Diabetes mellitus without complication (HCC)    Heart murmur    History of kidney stones 11/17/2015   Removed  two kidney stones about 1 cm in size   Hydrocele in adult 05/07/2015   Hypertension    Kidney stones    MRSA carrier 2019   Neuromuscular disorder (HCC)    Presence of permanent cardiac pacemaker    Medtronic implanted 2009 for sinus node dysfunction: generator change 2019   Prostatitis    Sleep apnea    Does not wear CPAP    ROS:   All systems reviewed and negative except as noted in the HPI.   Past Surgical History:  Procedure Laterality Date   CARDIOVASCULAR STRESS TEST  2014   Dr Bosie   CARPAL TUNNEL RELEASE Bilateral 2006   CERVICAL SPINE SURGERY  2008   C5/6   CHOLECYSTECTOMY N/A 10/27/2020   Procedure: LAPAROSCOPIC CHOLECYSTECTOMY;  Surgeon: Sebastian Moles, MD;  Location: [facility] OR;  Service: General;  Laterality: N/A;   COLONOSCOPY  07/2007   prostate nodule, hemorrhoid, rpt 5 yrs Cathe)   COLONOSCOPY WITH PROPOFOL  N/A 06/14/2023   TA, diverticulosis, hemorrhoids, rpt 7 yrs ([place])   ERCP N/A 10/26/2020   Procedure: ENDOSCOPIC RETROGRADE CHOLANGIOPANCREATOGRAPHY (ERCP);  Surgeon: Rollin Dover, MD;  Location: [facility] ENDOSCOPY;  Service: Endoscopy;  Laterality: N/A;  choledocholithiasis   ESOPHAGOGASTRODUODENOSCOPY (EGD) WITH PROPOFOL  N/A 10/26/2020   Procedure: ESOPHAGOGASTRODUODENOSCOPY (EGD) WITH PROPOFOL ;  Surgeon: Rollin Dover, MD;  Location: [facility] ENDOSCOPY;  Service: Endoscopy;  Laterality: N/A;   EUS  10/26/2020   Procedure: UPPER ENDOSCOPIC ULTRASOUND (EUS) LINEAR;  Surgeon: Rollin Dover, MD;  Location: [facility] ENDOSCOPY;  Service: Endoscopy;;   IMPLANTABLE CARDIOVERTER DEFIBRILLATOR (ICD) GENERATOR CHANGE Left 12/13/2017   Procedure: PACER CHANGE OUT;  Surgeon: Ammon Blunt, MD;  Location: [facility] ORS;  Service: Cardiovascular;  Laterality: Left;   LAPAROSCOPIC CHOLECYSTECTOMY  10/2020   LITHOTRIPSY Bilateral 2012   Stoioff   LITHOTRIPSY Left 11/2015   x2 Stoioff   pace maker revision  12/13/2017   PACEMAKER PLACEMENT  2009   s/p Medtronic Adapta DR Pacemaker  (Dr. Bosie)   POLYPECTOMY  06/14/2023   Procedure: POLYPECTOMY;  Surgeon: Cinderella Deatrice FALCON, MD;  Location: [facility] ENDO SU[facility];  Service: Endoscopy;;   REMOVAL OF STONES  10/26/2020   Procedure: REMOVAL OF STONES;  Surgeon: Rollin Dover, MD;  Location: [facility] ENDOSCOPY;  Service: Endoscopy;;   ROTATOR CUFF REPAIR Left 2009   SPHINCTEROTOMY  10/26/2020   Procedure: ANNETT;  Surgeon: Rollin Dover, MD;  Location: [facility] ENDOSCOPY;  Service: Endoscopy;;   SPINE SURGERY  08/21/2006 at [facility]   Cervical Spine C5 & C6 Surgery   TONSILLECTOMY  1964   ULNAR NERVE REPAIR  2008   Left  Family History  Problem Relation Age of Onset   Heart disease Mother    Diabetes Mother    Arthritis Mother    Cancer Mother        Breast Cancer   Anxiety disorder Mother    Depression Mother    Hearing loss Mother    Hyperlipidemia Mother    Hypertension Mother    Kidney disease Mother    Obesity Mother    Stroke Mother    Vision loss Mother    Heart block Father    Heart disease Father    Arthritis Father    Cancer Father        skin cancer   Hearing loss Father    Vision loss Father      Social History   Socioeconomic History   Marital status: Divorced    Spouse name: Not on file   Number of children: Not on file   Years of education: Not on file   Highest education level: 8th grade  Occupational History   Not on file  Tobacco Use   Smoking status: Never    Passive exposure: Past   Smokeless tobacco: Never  Vaping Use   Vaping status: Never Used  Substance and Sexual Activity   Alcohol use: No   Drug use: No   Sexual activity: Not Currently    Birth control/protection: Condom  Other Topics Concern   Not on file  Social History Narrative   Divorced. Lives with wife.   Edu: 8th grade   Occ: retired, disability for chronic neck pain    Activity: no regular exercise - limited by back and knee pain   Diet: poor water, lots of tang and gatorade, no vegetables or  fruits   Social Drivers of Corporate investment banker Strain: Low Risk  (12/24/2023)   Overall Financial Resource Strain (CARDIA)    Difficulty of Paying Living Expenses: Not hard at all  Food Insecurity: No Food Insecurity (12/24/2023)   Hunger Vital Sign    Worried About Running Out of Food in the Last Year: Never true    Ran Out of Food in the Last Year: Never true  Transportation Needs: No Transportation Needs (12/24/2023)   PRAPARE - Administrator, Civil Service (Medical): No    Lack of Transportation (Non-Medical): No  Physical Activity: Sufficiently Active (12/27/2023)   Exercise Vital Sign    Days of Exercise per Week: 7 days    Minutes of Exercise per Session: 30 min  Stress: No Stress Concern Present (12/24/2023)   [employer] of Occupational Health - Occupational Stress Questionnaire    Feeling of Stress : Not at all  Social Connections: Socially Isolated (12/24/2023)   Social Connection and Isolation Panel    Frequency of Communication with Friends and Family: More than three times a week    Frequency of Social Gatherings with Friends and Family: More than three times a week    Attends Religious Services: Never    Database administrator or Organizations: No    Attends Engineer, structural: Not on file    Marital Status: Divorced  Intimate Partner Violence: Not At Risk (12/27/2023)   Humiliation, Afraid, Rape, and Kick questionnaire    Fear of Current or Ex-Partner: No    Emotionally Abused: No    Physically Abused: No    Sexually Abused: No     BP 120/62 (BP Location: Left Arm, Patient Position: Sitting, Cuff Size: Normal)  Pulse 74   Ht 5' 7 (1.702 m)   Wt 218 lb (98.9 kg)   SpO2 95%   BMI 34.14 kg/m   Physical Exam:  Well appearing NAD HEENT: Unremarkable Neck:  No JVD, no thyromegally Lymphatics:  No adenopathy Back:  No CVA tenderness Lungs:  Clear with no wheezes HEART:  Regular rate rhythm, no murmurs, no rubs, no  clicks Abd:  soft, positive bowel sounds, no organomegally, no rebound, no guarding Ext:  2 plus pulses, no edema, no cyanosis, no clubbing Skin:  No rashes no nodules Neuro:  CN II through XII intact, motor grossly intact  DEVICE  Normal device function.  See PaceArt for details.   Assess/Plan:  Sinus node dysfunction - He is asymptomatic s/p PPM insertion.  PPM -his medtronic DDD PM is working normally. HTN - his bp is controlled fairly well. No change in meds.    Danelle Dama Hedgepeth,MD

## 2024-04-16 NOTE — Patient Instructions (Signed)
 Medication Instructions:  Your physician recommends that you continue on your current medications as directed. Please refer to the Current Medication list given to you today.  *If you need a refill on your cardiac medications before your next appointment, please call your pharmacy*  Lab Work: NONE   If you have labs (blood work) drawn today and your tests are completely normal, you will receive your results only by: MyChart Message (if you have MyChart) OR A paper copy in the mail If you have any lab test that is abnormal or we need to change your treatment, we will call you to review the results.  Testing/Procedures: NONE   Follow-Up: At [facility], you and your health needs are our priority.  As part of our continuing mission to provide you with exceptional heart care, our providers are all part of one team.  This team includes your primary Cardiologist (physician) and Advanced Practice Providers or APPs (Physician Assistants and Nurse Practitioners) who all work together to provide you with the care you need, when you need it.  Your next appointment:   1 year(s)  Provider:   Dr. Almetta     We recommend signing up for the patient portal called MyChart.  Sign up information is provided on this After Visit Summary.  MyChart is used to connect with patients for Virtual Visits (Telemedicine).  Patients are able to view lab/test results, encounter notes, upcoming appointments, etc.  Non-urgent messages can be sent to your provider as well.   To learn more about what you can do with MyChart, go to [email or web address].   Other Instructions Thank you for choosing [facility]!

## 2024-04-17 ENCOUNTER — Ambulatory Visit: Attending: Nurse Practitioner | Admitting: Nurse Practitioner

## 2024-04-17 ENCOUNTER — Encounter: Payer: Self-pay | Admitting: Nurse Practitioner

## 2024-04-17 DIAGNOSIS — G8929 Other chronic pain: Secondary | ICD-10-CM | POA: Diagnosis not present

## 2024-04-17 DIAGNOSIS — M5412 Radiculopathy, cervical region: Secondary | ICD-10-CM | POA: Diagnosis not present

## 2024-04-17 DIAGNOSIS — M533 Sacrococcygeal disorders, not elsewhere classified: Secondary | ICD-10-CM

## 2024-04-17 DIAGNOSIS — M545 Low back pain, unspecified: Secondary | ICD-10-CM | POA: Diagnosis not present

## 2024-04-17 DIAGNOSIS — G894 Chronic pain syndrome: Secondary | ICD-10-CM

## 2024-04-17 DIAGNOSIS — Z79891 Long term (current) use of opiate analgesic: Secondary | ICD-10-CM

## 2024-04-17 DIAGNOSIS — M25512 Pain in left shoulder: Secondary | ICD-10-CM | POA: Diagnosis not present

## 2024-04-17 DIAGNOSIS — M542 Cervicalgia: Secondary | ICD-10-CM | POA: Insufficient documentation

## 2024-04-17 DIAGNOSIS — Z79899 Other long term (current) drug therapy: Secondary | ICD-10-CM | POA: Diagnosis not present

## 2024-04-17 DIAGNOSIS — M961 Postlaminectomy syndrome, not elsewhere classified: Secondary | ICD-10-CM | POA: Diagnosis not present

## 2024-04-17 DIAGNOSIS — G4486 Cervicogenic headache: Secondary | ICD-10-CM | POA: Diagnosis not present

## 2024-04-17 DIAGNOSIS — M47816 Spondylosis without myelopathy or radiculopathy, lumbar region: Secondary | ICD-10-CM

## 2024-04-17 DIAGNOSIS — M25511 Pain in right shoulder: Secondary | ICD-10-CM

## 2024-04-17 MED ORDER — OXYCODONE HCL 10 MG PO TABS: 10.0000 mg | ORAL_TABLET | Freq: Three times a day (TID) | ORAL | 0 refills | Status: DC | PRN

## 2024-04-17 NOTE — Progress Notes (Signed)
 Nursing Pain Medication Assessment:  Safety precautions to be maintained throughout the outpatient stay will include: orient to surroundings, keep bed in low position, maintain call bell within reach at all times, provide assistance with transfer out of bed and ambulation.  Medication Inspection Compliance: Pill count conducted under aseptic conditions, in front of the patient. Neither the pills nor the bottle was removed from the patient's sight at any time. Once count was completed pills were immediately returned to the patient in their original bottle.  Medication: Oxycodone  IR Pill/Patch Count: 72 of 90 pills/patches remain Pill/Patch Appearance: Markings consistent with prescribed medication Bottle Appearance: Standard pharmacy container. Clearly labeled. Filled Date: 09 / 02 / 2025 Last Medication intake:  Today

## 2024-04-17 NOTE — Progress Notes (Signed)
 PROVIDER NOTE: Interpretation of information contained herein should be left to medically-trained personnel. Specific patient instructions are provided elsewhere under Patient Instructions section of medical record. This document was created in part using AI and STT-dictation technology, any transcriptional errors that may result from this process are unintentional.  Patient: Mark [name]  Service: E/M   PCP: Rilla Baller, MD  DOB: 11-Aug-1955  DOS: 04/17/2024  Provider: Emmy MARLA Blanch, NP  MRN: [id number]  Delivery: Face-to-face  Specialty: Interventional Pain Management  Type: Established Patient  Setting: Ambulatory outpatient facility  Specialty designation: 09  Referring Prov.: Rilla Baller, MD  Location: Outpatient office facility       History of present illness (HPI) Mark [name], a 68 y.o. year old male, is here today because of his No primary diagnosis found.. Mr. 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   Pain Assessment: Severity of Chronic pain is reported as a 4 /10. Location: Shoulder Left/From neck to shoulder. Onset: More than a month ago. Quality: Aching, Dull. Timing: Intermittent. Modifying factor(s): Medication, rest. Vitals:  height is 5' 7 (1.702 m) and weight is 218 lb (98.9 kg). His temporal temperature is 97.9 F (36.6 C). His blood pressure is 107/67 and his pulse is 62. His respiration is 20 and oxygen saturation is 100%.  BMI: Estimated body mass index is 34.14 kg/m as calculated from the following:    Height as of this encounter: 5' 7 (1.702 m).   Weight as of this encounter: 218 lb (98.9 kg).  Last encounter: 01/23/2024. Last procedure: Visit date not found.  Reason for encounter: medication management. The patient indicates doing well with current medication regimen.  No adverse reaction or side effects reported to medication.  He experiences pain primarily in his left shoulder which worsen with weather changes.  The pain significantly impacts daily activities.  Pharmacotherapy Assessment   Analgesic: Oxycodone  HCl 10 mg by mouth every 8 hours as needed for pain. MME=45 Monitoring: [place] PMP: PDMP reviewed during this encounter.       Pharmacotherapy: No side-effects or adverse reactions reported. Compliance: No problems identified. Effectiveness: Clinically acceptable.  Mark [name], [place]  04/17/2024  2:06 PM  Sign when Signing Visit Nursing Pain Medication Assessment:  Safety precautions to be maintained throughout the outpatient stay will include: orient to surroundings, keep bed in low position, maintain call bell within reach at all times, provide assistance with transfer out of bed and ambulation.  Medication Inspection Compliance: Pill count conducted under aseptic conditions, in front of the patient. Neither the pills nor the bottle was removed from the patient's sight at any time. Once count was completed pills were immediately returned to the patient in their original bottle.  Medication: Oxycodone  IR Pill/Patch Count: 72 of 90 pills/patches remain Pill/Patch Appearance: Markings consistent with prescribed medication Bottle Appearance: Standard pharmacy container. Clearly labeled. Filled Date: 09 / 02 / 2025 Last Medication intake:  Today    UDS:  Summary  Date Value Ref Range Status  01/23/2024 FINAL  Final    Comment:    ==================================================================== [employer] [facility]  (MW) ==================================================================== Test  Result       Flag       Units  Drug Present and Declared for Prescription Verification   Oxycodone                       3020         EXPECTED   ng/mg creat   Noroxycodone                   4543         EXPECTED   ng/mg creat    Sources of oxycodone  include scheduled prescription medications.    Noroxycodone is an expected metabolite of oxycodone .  ==================================================================== Test                      Result    Flag   Units      Ref Range   Creatinine              44               mg/dL      >=79 ==================================================================== Declared Medications:  The flagging and interpretation on this report are based on the  following declared medications.  Unexpected results may arise from  inaccuracies in the declared medications.   **Note: The testing scope of this panel includes these medications:   Oxycodone    **Note: The testing scope of this panel does not include the  following reported medications:   Amlodipine  (Norvasc )  Aspirin  Cholecalciferol  Cyclobenzaprine  (Flexeril )  Diphenhydramine  (Benadryl )  Fish Oil  Fluoxetine  (Prozac )  Fluticasone  (Flonase )  Furosemide  (Lasix )  Metformin  (Glucophage )  Naloxone  (Narcan )  Naproxen  (Naprosyn )  Omeprazole  (Prilosec)  Potassium  Ropinirole  (Requip )  Sildenafil  (Revatio )  Supplement  Vitamin C ==================================================================== For clinical consultation, please call [phone]. ====================================================================     No results found for: CBDTHCR No results found for: D8THCCBX No results found for: D9THCCBX  ROS  Constitutional: Denies any fever or chills Gastrointestinal: No reported hemesis, hematochezia, vomiting, or acute GI distress Musculoskeletal: Neck pain,  left shoulder pain Neurological: No reported episodes of acute onset apraxia, aphasia, dysarthria, agnosia, amnesia, paralysis, loss of coordination, or loss of consciousness  Medication Review  B Complex-C, CertaVite Senior/Antioxidant, Cholecalciferol, FLUoxetine , Fish Oil, Garlic, Oxycodone  HCl, amLODipine , aspirin, atorvastatin , cyclobenzaprine , diphenhydrAMINE , fluticasone , furosemide , metFORMIN , naloxone , naproxen , omeprazole , potassium citrate , rOPINIRole , sildenafil , and vitamin C  History Review  Allergy: Mr. Mcguirt is allergic to ciprofloxacin , losartan , metformin , buprenorphine hcl, morphine  and codeine, and penicillins. Drug: Mr. Schaum  reports no history of drug use. Alcohol:  reports no history of alcohol use. Tobacco:  reports that he has never smoked. He has been exposed to tobacco smoke. He has never used smokeless tobacco. Social: Mr. Nave  reports that he has never smoked. He has been exposed to tobacco smoke. He has never used smokeless tobacco. He reports that he does not drink alcohol and does not use drugs. Medical:  has a past medical history of Allergy, Anxiety, Arthritis, Chronic pain, Depression, Diabetes mellitus without complication (HCC), Heart murmur, History of kidney stones (11/17/2015), Hydrocele in adult (05/07/2015), Hypertension, Kidney stones, MRSA carrier (2019), Neuromuscular disorder (HCC), Presence of permanent cardiac pacemaker, Prostatitis, and Sleep apnea. Surgical: Mr. Westrup  has a past surgical history that includes Tonsillectomy (1964); Carpal tunnel release (Bilateral, 2006); Ulnar nerve repair (2008); Cervical spine surgery (2008); Rotator cuff repair (Left, 2009); pacemaker placement (2009); Lithotripsy (Bilateral, 2012); Lithotripsy (Left, 11/2015); Cardiovascular stress test (  2014); Colonoscopy (07/2007); implantable cardioverter defibrillator (icd) generator change (Left, 12/13/2017); pace maker revision (12/13/2017); Laparoscopic cholecystectomy  (10/2020); Cholecystectomy (N/A, 10/27/2020); ERCP (N/A, 10/26/2020); Esophagogastroduodenoscopy (egd) with propofol  (N/A, 10/26/2020); EUS (10/26/2020); Sphincterotomy (10/26/2020); removal of stones (10/26/2020); Colonoscopy with propofol  (N/A, 06/14/2023); polypectomy (06/14/2023); and Spine surgery (08/21/2006 at [facility]). Family: family history includes Anxiety disorder in his mother; Arthritis in his father and mother; Cancer in his father and mother; Depression in his mother; Diabetes in his mother; Hearing loss in his father and mother; Heart block in his father; Heart disease in his father and mother; Hyperlipidemia in his mother; Hypertension in his mother; Kidney disease in his mother; Obesity in his mother; Stroke in his mother; Vision loss in his father and mother.  Laboratory Chemistry Profile   Renal Lab Results  Component Value Date   BUN 29 (H) 12/25/2023   CREATININE 0.93 12/25/2023   GFR 85.29 12/25/2023   GFRAA >60 12/06/2017   GFRNONAA >60 10/29/2020    Hepatic Lab Results  Component Value Date   AST 25 12/25/2023   ALT 27 12/25/2023   ALBUMIN 4.0 12/25/2023   ALKPHOS 58 12/25/2023   HCVAB NEGATIVE 07/22/2013   LIPASE 39 10/28/2020    Electrolytes Lab Results  Component Value Date   NA 140 12/25/2023   K 3.9 12/25/2023   CL 103 12/25/2023   CALCIUM  9.1 12/25/2023   MG 2.1 09/01/2015    Bone Lab Results  Component Value Date   25OHVITD1 63 02/02/2016   25OHVITD2 <1.0 02/02/2016   25OHVITD3 63 02/02/2016   TESTOSTERONE  495 08/08/2017    Inflammation (CRP: Acute Phase) (ESR: Chronic Phase) Lab Results  Component Value Date   CRP 1.7 (H) 09/01/2015   ESRSEDRATE 6 09/01/2015         Note: Above Lab results reviewed.  Recent Imaging Review  CUP PACEART INCLINIC DEVICE CHECK Normal in-clinic dual chamber pacemaker check. Presenting Rhythm: AP/VS 61 . Routine testing of thresholds, sensing, and impedance demonstrate stable parameters and no  programming changes needed at this time. 5 Short NSVT events. Estimated longevity 6.4  years. Pt enrolled in remote follow-up.Lorn Fees, RN Note: Reviewed        Physical Exam  Vitals: BP 107/67 (BP Location: Right Arm, Patient Position: Sitting)   Pulse 62   Temp 97.9 F (36.6 C) (Temporal)   Resp 20   Ht 5' 7 (1.702 m)   Wt 218 lb (98.9 kg)   SpO2 100%   BMI 34.14 kg/m  BMI: Estimated body mass index is 34.14 kg/m as calculated from the following:   Height as of this encounter: 5' 7 (1.702 m).   Weight as of this encounter: 218 lb (98.9 kg). Ideal: Ideal body weight: 66.1 kg (145 lb 11.6 oz) Adjusted ideal body weight: 79.2 kg (174 lb 10.1 oz) General appearance: Well nourished, well developed, and well hydrated. In no apparent acute distress Mental status: Alert, oriented x 3 (person, place, & time)       Respiratory: No evidence of acute respiratory distress Eyes: PERLA   Assessment   Diagnosis Status  1. Cervicalgia   2. Chronic neck pain (1ry area of Pain) (Bilateral) (L>R)   3. Chronic sacroiliac joint pain (Bilateral)   4. Cervicogenic headache   5. 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 Lumbar facet syndrome (Bilateral)   7. Chronic shoulder pain (Bilateral) (L>R)   8. Chronic cervical radicular pain   9. Chronic low back  pain (Bilateral) w/o sciatica   10. Chronic pain syndrome   11. Pharmacologic therapy   12. Chronic use of opiate for therapeutic purpose   13. Encounter for medication management   14. Encounter for chronic pain management    Controlled Controlled Controlled   Updated Problems: No problems updated.  Plan of Care  Problem-specific:  Assessment and Plan  We will continue on current medication regimen.  Prescribing drug monitoring (PDMP) reviewed; findings consistent with the use of prescribed medication and no evidence of narcotic misuse or abuse.  Urine drug screening (UDS) up-to-date.  No other new issues  or concerns reported at this visit.  Schedule follow-up in 90 days for medication management with Emmy Blanch, NP.   Mr. JAASIEL HOLLYFIELD has a current medication list which includes the following long-term medication(s): amlodipine , atorvastatin , diphenhydramine , fluoxetine , fluoxetine , fluticasone , furosemide , metformin , omeprazole , ropinirole , [START ON 05/08/2024] oxycodone  hcl, [START ON 06/07/2024] oxycodone  hcl, and [START ON 07/07/2024] oxycodone  hcl.  Pharmacotherapy (Medications Ordered): Meds ordered this encounter  Medications   Oxycodone  HCl 10 MG TABS    Sig: Take 1 tablet (10 mg total) by mouth every 8 (eight) hours as needed. Each refill must last 30 days.    Dispense:  90 tablet    Refill:  0    DO NOT: delete (not duplicate); no partial-fill (will deny script to complete), no refill request (F/U required). DISPENSE: 1 day early if closed on fill date. WARN: No CNS-depressants within 8 hrs of med.   Oxycodone  HCl 10 MG TABS    Sig: Take 1 tablet (10 mg total) by mouth every 8 (eight) hours as needed. Each refill must last 30 days.    Dispense:  90 tablet    Refill:  0    DO NOT: delete (not duplicate); no partial-fill (will deny script to complete), no refill request (F/U required). DISPENSE: 1 day early if closed on fill date. WARN: No CNS-depressants within 8 hrs of med.   Oxycodone  HCl 10 MG TABS    Sig: Take 1 tablet (10 mg total) by mouth every 8 (eight) hours as needed. Each refill must last 30 days.    Dispense:  90 tablet    Refill:  0    DO NOT: delete (not duplicate); no partial-fill (will deny script to complete), no refill request (F/U required). DISPENSE: 1 day early if closed on fill date. WARN: No CNS-depressants within 8 hrs of med.   Orders:  No orders of the defined types were placed in this encounter.     Return in about 3 months (around 07/17/2024) for (F2F), (MM), Emmy Blanch NP.    Recent Visits Date Type Provider Dept  01/23/24 Office Visit Liane Tribbey,  Maicol Bowland K, NP [facility]  Showing recent visits within past 90 days and meeting all other requirements Today's Visits Date Type Provider Dept  04/17/24 Office Visit Jameila Keeny K, NP [facility]  Showing today's visits and meeting all other requirements Future Appointments Date Type Provider Dept  07/10/24 Appointment Matteson Blue K, NP [facility]  Showing future appointments within next 90 days and meeting all other requirements  I discussed the assessment and treatment plan with the patient. The patient was provided an opportunity to ask questions and all were answered. The patient agreed with the plan and demonstrated an understanding of the instructions.  Patient advised to call back or seek an in-person evaluation if the symptoms or condition worsens.  Duration of encounter: 30  minutes.  Total time on encounter, as per AMA guidelines included both the face-to-face and non-face-to-face time personally spent by the physician and/or other qualified health care professional(s) on the day of the encounter (includes time in activities that require the physician or other qualified health care professional and does not include time in activities normally performed by clinical staff). Physician's time may include the following activities when performed: Preparing to see the patient (e.g., pre-charting review of records, searching for previously ordered imaging, lab work, and nerve conduction tests) Review of prior analgesic pharmacotherapies. Reviewing PMP Interpreting ordered tests (e.g., lab work, imaging, nerve conduction tests) Performing post-procedure evaluations, including interpretation of diagnostic procedures Obtaining and/or reviewing separately obtained history Performing a medically appropriate examination and/or evaluation Counseling and educating the patient/family/caregiver Ordering medications, tests, or procedures Referring and communicating with  other health care professionals (when not separately reported) Documenting clinical information in the electronic or other health record Independently interpreting results (not separately reported) and communicating results to the patient/ family/caregiver Care coordination (not separately reported)  Note by: Chike Farrington K Chales Pelissier, NP (TTS and AI technology used. I apologize for any typographical errors that were not detected and corrected.) Date: 04/17/2024; Time: 2:34 PM

## 2024-05-01 NOTE — Progress Notes (Signed)
 Remote PPM Transmission

## 2024-05-15 ENCOUNTER — Other Ambulatory Visit: Payer: Self-pay

## 2024-05-15 MED ORDER — CYCLOBENZAPRINE HCL 10 MG PO TABS: ORAL_TABLET | ORAL | 0 refills | Status: AC

## 2024-05-15 NOTE — Telephone Encounter (Signed)
 Last given 09/03/23 #40 no refills

## 2024-06-17 ENCOUNTER — Ambulatory Visit: Payer: Medicare Other

## 2024-06-17 DIAGNOSIS — I495 Sick sinus syndrome: Secondary | ICD-10-CM | POA: Diagnosis not present

## 2024-06-18 LAB — CUP PACEART REMOTE DEVICE CHECK
Battery Remaining Longevity: 74 mo
Battery Voltage: 2.97 V
Brady Statistic AP VP Percent: 0.85 %
Brady Statistic AP VS Percent: 98.31 %
Brady Statistic AS VP Percent: 0 %
Brady Statistic AS VS Percent: 0.85 %
Brady Statistic RA Percent Paced: 99.81 %
Brady Statistic RV Percent Paced: 0.85 %
Date Time Interrogation Session: 20251110184043
Implantable Lead Connection Status: 753985
Implantable Lead Connection Status: 753985
Implantable Lead Implant Date: 20190509
Implantable Lead Implant Date: 20190509
Implantable Lead Location: 753859
Implantable Lead Location: 753860
Implantable Lead Model: 5092
Implantable Lead Model: 5592
Implantable Pulse Generator Implant Date: 20190509
Lead Channel Impedance Value: 380 Ohm
Lead Channel Impedance Value: 399 Ohm
Lead Channel Impedance Value: 513 Ohm
Lead Channel Impedance Value: 627 Ohm
Lead Channel Pacing Threshold Amplitude: 1 V
Lead Channel Pacing Threshold Amplitude: 1.125 V
Lead Channel Pacing Threshold Pulse Width: 0.4 ms
Lead Channel Pacing Threshold Pulse Width: 0.4 ms
Lead Channel Sensing Intrinsic Amplitude: 2.625 mV
Lead Channel Sensing Intrinsic Amplitude: 2.625 mV
Lead Channel Sensing Intrinsic Amplitude: 9.25 mV
Lead Channel Sensing Intrinsic Amplitude: 9.25 mV
Lead Channel Setting Pacing Amplitude: 2 V
Lead Channel Setting Pacing Amplitude: 2.25 V
Lead Channel Setting Pacing Pulse Width: 0.4 ms
Lead Channel Setting Sensing Sensitivity: 2 mV
Zone Setting Status: 755011
Zone Setting Status: 755011

## 2024-06-20 NOTE — Progress Notes (Signed)
 Remote PPM Transmission

## 2024-06-22 ENCOUNTER — Ambulatory Visit: Payer: Self-pay | Admitting: Internal Medicine

## 2024-06-23 ENCOUNTER — Other Ambulatory Visit: Payer: Self-pay | Admitting: Family Medicine

## 2024-06-23 DIAGNOSIS — G894 Chronic pain syndrome: Secondary | ICD-10-CM

## 2024-07-10 ENCOUNTER — Encounter: Payer: Self-pay | Admitting: Nurse Practitioner

## 2024-07-10 ENCOUNTER — Ambulatory Visit: Attending: Nurse Practitioner | Admitting: Nurse Practitioner

## 2024-07-10 DIAGNOSIS — M545 Low back pain, unspecified: Secondary | ICD-10-CM | POA: Insufficient documentation

## 2024-07-10 DIAGNOSIS — Z79899 Other long term (current) drug therapy: Secondary | ICD-10-CM | POA: Insufficient documentation

## 2024-07-10 DIAGNOSIS — M25512 Pain in left shoulder: Secondary | ICD-10-CM | POA: Insufficient documentation

## 2024-07-10 DIAGNOSIS — G8929 Other chronic pain: Secondary | ICD-10-CM | POA: Insufficient documentation

## 2024-07-10 DIAGNOSIS — M542 Cervicalgia: Secondary | ICD-10-CM | POA: Insufficient documentation

## 2024-07-10 DIAGNOSIS — Z79891 Long term (current) use of opiate analgesic: Secondary | ICD-10-CM | POA: Insufficient documentation

## 2024-07-10 DIAGNOSIS — M961 Postlaminectomy syndrome, not elsewhere classified: Secondary | ICD-10-CM

## 2024-07-10 DIAGNOSIS — M25511 Pain in right shoulder: Secondary | ICD-10-CM | POA: Insufficient documentation

## 2024-07-10 DIAGNOSIS — G894 Chronic pain syndrome: Secondary | ICD-10-CM | POA: Insufficient documentation

## 2024-07-10 DIAGNOSIS — M5412 Radiculopathy, cervical region: Secondary | ICD-10-CM | POA: Insufficient documentation

## 2024-07-10 DIAGNOSIS — M47816 Spondylosis without myelopathy or radiculopathy, lumbar region: Secondary | ICD-10-CM | POA: Insufficient documentation

## 2024-07-10 DIAGNOSIS — G4486 Cervicogenic headache: Secondary | ICD-10-CM

## 2024-07-10 DIAGNOSIS — M533 Sacrococcygeal disorders, not elsewhere classified: Secondary | ICD-10-CM | POA: Insufficient documentation

## 2024-07-10 MED ORDER — OXYCODONE HCL 10 MG PO TABS: 10.0000 mg | ORAL_TABLET | Freq: Three times a day (TID) | ORAL | 0 refills | Status: AC | PRN

## 2024-07-10 NOTE — Progress Notes (Signed)
 Nursing Pain Medication Assessment:  Safety precautions to be maintained throughout the outpatient stay will include: orient to surroundings, keep bed in low position, maintain call bell within reach at all times, provide assistance with transfer out of bed and ambulation.  Medication Inspection Compliance: Pill count conducted under aseptic conditions, in front of the patient. Neither the pills nor the bottle was removed from the patient's sight at any time. Once count was completed pills were immediately returned to the patient in their original bottle.  Medication: Oxycodone  IR Pill/Patch Count: 80 of 90 pills/patches remain Pill/Patch Appearance: Markings consistent with prescribed medication Bottle Appearance: Standard pharmacy container. Clearly labeled. Filled Date: 12/ / 01 / 2025 Last Medication intake:  Today

## 2024-07-10 NOTE — Progress Notes (Signed)
 PROVIDER NOTE: Interpretation of information contained herein should be left to medically-trained personnel. Specific patient instructions are provided elsewhere under Patient Instructions section of medical record. This document was created in part using AI and STT-dictation technology, any transcriptional errors that may result from this process are unintentional.  Patient: Mark [name]  Service: E/M   PCP: Rilla Baller, MD  DOB: 07-Mar-1956  DOS: 07/10/2024  Provider: Emmy MARLA Blanch, NP  MRN: [id number]  Delivery: Face-to-face  Specialty: Interventional Pain Management  Type: Established Patient  Setting: Ambulatory outpatient facility  Specialty designation: 09  Referring Prov.: Rilla Baller, MD  Location: Outpatient office facility       History of present illness (HPI) [name], a 68 y.o. year old male, is here today because of his left shoulder pain Mr. Weatherly's primary complain today is Shoulder Pain (Left )  Pertinent problems: Mr. 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  Pain Assessment: Severity of Chronic pain is reported as a 5 /10. Location: Shoulder Left/Radiates up into neck. Onset: More than a month ago. Quality: Aching, Sharp, Stabbing. Timing: Intermittent. Modifying factor(s): Pain medication, rest. Vitals:  height is 5' 7 (1.702 m) and weight is 217 lb (98.4 kg). His temporal temperature is 97.2 F (36.2 C) (abnormal). His blood pressure is 141/73 (abnormal) and his pulse is 82. His respiration is 20 and oxygen saturation is 100%.  BMI: Estimated body mass index is 33.99 kg/m as calculated from the following:   Height as of this encounter: 5' 7 (1.702 m).   Weight as of this encounter: 217 lb (98.4 kg).  Last encounter: 04/17/2024. Last procedure: Visit date not found.  Reason for encounter: medication management. The patient indicates doing well with current medication regimen. No adverse reaction or side effects reported to medication. He experiences pain primarily in his left shoulder which worsen with weather changes. The pain significantly impacts daily activities.   Discussed the use of AI scribe software for clinical note transcription with the patient, who gave verbal consent to proceed.  History of Present Illness   Mark [name] is a 68 year old male who presents with left shoulder and neck pain.  He experiences pain in his left shoulder that radiates into his neck. This condition has been managed with injections and oral medication. He takes oxycodone  10 mg every eight hours, typically three doses per day, but sometimes manages with two doses on less active days.  Initially, the medication caused side effects such as itching and constipation. He used Benadryl  to manage the itching. Over time, his body adjusted, and these side effects have resolved.     Pharmacotherapy Assessment   Analgesic: Oxycodone  HCl 10 mg by mouth every 8 hours as needed for pain.  MME=45 Monitoring:  [place] PMP: PDMP reviewed during this encounter.       Pharmacotherapy: No side-effects or adverse reactions reported. Compliance: No problems identified. Effectiveness: Clinically acceptable.  Mark [name], [place]  07/10/2024  2:54 PM  Sign when Signing Visit Nursing Pain Medication Assessment:  Safety precautions to be maintained throughout the outpatient stay will include: orient to surroundings, keep bed in low position, maintain call bell within reach at all times, provide assistance with transfer out of bed and ambulation.  Medication Inspection Compliance: Pill count conducted under aseptic conditions, in front of the patient. Neither the pills nor the bottle was removed from the patient's sight at any time. Once count was completed pills were immediately returned to the patient in their original bottle.  Medication: Oxycodone  IR Pill/Patch Count: 80 of 90 pills/patches remain Pill/Patch Appearance: Markings consistent with prescribed medication Bottle Appearance: Standard pharmacy container. Clearly labeled. Filled Date: 12/ / 01 / 2025 Last Medication intake:  Today    UDS:  Summary  Date Value Ref Range Status  01/23/2024 FINAL  Final    Comment:    ==================================================================== [employer] Select 13 (MW) ==================================================================== Test                             Result       Flag       Units  Drug Present and Declared for Prescription Verification   Oxycodone                       3020         EXPECTED   ng/mg creat   Noroxycodone                   4543         EXPECTED   ng/mg creat    Sources of oxycodone  include scheduled prescription medications.    Noroxycodone is an expected metabolite of oxycodone .  ==================================================================== Test                      Result    Flag   Units      Ref Range   Creatinine              44                mg/dL      >=79 ==================================================================== Declared Medications:  The flagging and interpretation on this report are based on the  following declared medications.  Unexpected results may arise from  inaccuracies in the declared medications.   **Note: The testing scope of this panel includes these medications:   Oxycodone    **Note: The testing scope of this panel does not include the  following reported medications:   Amlodipine  (Norvasc )  Aspirin  Cholecalciferol  Cyclobenzaprine  (Flexeril )  Diphenhydramine  (Benadryl )  Fish Oil  Fluoxetine  (Prozac )  Fluticasone  (Flonase )  Furosemide  (Lasix )  Metformin  (Glucophage )  Naloxone  (Narcan )  Naproxen  (Naprosyn )  Omeprazole  (Prilosec)  Potassium  Ropinirole  (Requip )  Sildenafil  (Revatio )  Supplement  Vitamin C ==================================================================== For clinical consultation, please call [phone]. ====================================================================     No results found for: CBDTHCR No results found for: D8THCCBX No results found for: D9THCCBX  ROS  Constitutional: Denies any fever or chills Gastrointestinal: No reported hemesis, hematochezia, vomiting, or acute GI distress Musculoskeletal: left shoulder pain Neurological: No reported episodes of acute onset apraxia, aphasia, dysarthria, agnosia, amnesia, paralysis, loss of coordination, or  loss of consciousness  Medication Review  B Complex-C, CertaVite Senior/Antioxidant, Cholecalciferol, FLUoxetine , Fish Oil, Garlic, Oxycodone  HCl, amLODipine , aspirin, atorvastatin , cyclobenzaprine , diphenhydrAMINE , fluticasone , furosemide , metFORMIN , naloxone , naproxen , omeprazole , potassium citrate , rOPINIRole , sildenafil , and vitamin C  History Review  Allergy: Mr. Nuon is allergic to ciprofloxacin , losartan , metformin , buprenorphine hcl, morphine  and codeine, and penicillins. Drug:  Mr. Ericsson  reports no history of drug use. Alcohol:  reports no history of alcohol use. Tobacco:  reports that he has never smoked. He has been exposed to tobacco smoke. He has never used smokeless tobacco. Social: Mr. Sexson  reports that he has never smoked. He has been exposed to tobacco smoke. He has never used smokeless tobacco. He reports that he does not drink alcohol and does not use drugs. Medical:  has a past medical history of Allergy, Anxiety, Arthritis, Chronic pain, Depression, Diabetes mellitus without complication (HCC), Heart murmur, History of kidney stones (11/17/2015), Hydrocele in adult (05/07/2015), Hypertension, Kidney stones, MRSA carrier (2019), Neuromuscular disorder (HCC), Presence of permanent cardiac pacemaker, Prostatitis, and Sleep apnea. Surgical: Mr. Fusselman  has a past surgical history that includes Tonsillectomy (1964); Carpal tunnel release (Bilateral, 2006); Ulnar nerve repair (2008); Cervical spine surgery (2008); Rotator cuff repair (Left, 2009); pacemaker placement (2009); Lithotripsy (Bilateral, 2012); Lithotripsy (Left, 11/2015); Cardiovascular stress test (2014); Colonoscopy (07/2007); implantable cardioverter defibrillator (icd) generator change (Left, 12/13/2017); pace maker revision (12/13/2017); Laparoscopic cholecystectomy (10/2020); Cholecystectomy (N/A, 10/27/2020); ERCP (N/A, 10/26/2020); Esophagogastroduodenoscopy (egd) with propofol  (N/A, 10/26/2020); EUS (10/26/2020); Sphincterotomy (10/26/2020); removal of stones (10/26/2020); Colonoscopy with propofol  (N/A, 06/14/2023); polypectomy (06/14/2023); and Spine surgery (08/21/2006 at [facility]). Family: family history includes Anxiety disorder in his mother; Arthritis in his father and mother; Cancer in his father and mother; Depression in his mother; Diabetes in his mother; Hearing loss in his father and mother; Heart block in his father; Heart disease in his father and mother; Hyperlipidemia in his  mother; Hypertension in his mother; Kidney disease in his mother; Obesity in his mother; Stroke in his mother; Vision loss in his father and mother.  Laboratory Chemistry Profile   Renal Lab Results  Component Value Date   BUN 29 (H) 12/25/2023   CREATININE 0.93 12/25/2023   GFR 85.29 12/25/2023   GFRAA >60 12/06/2017   GFRNONAA >60 10/29/2020    Hepatic Lab Results  Component Value Date   AST 25 12/25/2023   ALT 27 12/25/2023   ALBUMIN 4.0 12/25/2023   ALKPHOS 58 12/25/2023   HCVAB NEGATIVE 07/22/2013   LIPASE 39 10/28/2020    Electrolytes Lab Results  Component Value Date   NA 140 12/25/2023   K 3.9 12/25/2023   CL 103 12/25/2023   CALCIUM  9.1 12/25/2023   MG 2.1 09/01/2015    Bone Lab Results  Component Value Date   25OHVITD1 63 02/02/2016   25OHVITD2 <1.0 02/02/2016   25OHVITD3 63 02/02/2016   TESTOSTERONE  495 08/08/2017    Inflammation (CRP: Acute Phase) (ESR: Chronic Phase) Lab Results  Component Value Date   CRP 1.7 (H) 09/01/2015   ESRSEDRATE 6 09/01/2015         Note: Above Lab results reviewed.  Recent Imaging Review  CUP PACEART REMOTE DEVICE CHECK PPM Scheduled remote reviewed. Normal device function.  Presenting rhythm: AP/VS  Next remote transmission per protocol. LA, CVRS Note: Reviewed        Physical Exam  Vitals: BP (!) 141/73 (BP Location: Right Arm, Patient Position: Sitting, Cuff Size: Normal)   Pulse 82   Temp ROLLEN)  97.2 F (36.2 C) (Temporal)   Resp 20   Ht 5' 7 (1.702 m)   Wt 217 lb (98.4 kg)   SpO2 100%   BMI 33.99 kg/m  BMI: Estimated body mass index is 33.99 kg/m as calculated from the following:   Height as of this encounter: 5' 7 (1.702 m).   Weight as of this encounter: 217 lb (98.4 kg). Ideal: Ideal body weight: 66.1 kg (145 lb 11.6 oz) Adjusted ideal body weight: 79 kg (174 lb 3.8 oz) General appearance: Well nourished, well developed, and well hydrated. In no apparent acute distress Mental status: Alert,  oriented x 3 (person, place, & time)       Respiratory: No evidence of acute respiratory distress Eyes: PERLA  Musculoskeletal: Left shoulder pain  Assessment   Diagnosis Status  1. Chronic shoulder pain (Bilateral) (L>R)   2. Chronic pain syndrome   3. Pharmacologic therapy   4. Chronic use of opiate for therapeutic purpose   5. Cervicalgia   6. Chronic neck pain (1ry area of Pain) (Bilateral) (L>R)   7. Chronic sacroiliac joint pain (Bilateral)   8. Lumbar facet syndrome (Bilateral)   9. Chronic cervical radicular pain   10. Chronic low back pain (Bilateral) w/o sciatica    Controlled Controlled Controlled   Updated Problems: Problem  Chronic Use of Opiate for Therapeutic Purpose    Plan of Care  Problem-specific:  Assessment and Plan    Chronic neck and left shoulder pain Chronic pain managed with oxycodone , requiring dosing every eight hours. Previous side effects resolved. - Continue oxycodone  10 mg every eight hours as needed.  Chronic pain syndrome Pain in left shoulder and neck managed with long-term opioid therapy. No new side effects.  Chronic opioid therapy Long-term oxycodone  use for chronic pain. Initial side effects resolved, no current adverse effects.  Patient's pain is controlled with oxycodone , will continue on current medication regimen.  Prescribing drug monitoring (PDMP) reviewed; findings consistent with the use of prescribed medication and no evidence of narcotic misuse or abuse.  Urine drug screening (UDS) up-to-date. No side effects or adverse reaction reported to medication. Schedule follow-up in 90 days for medication management.      Mr. EION TIMBROOK has a current medication list which includes the following long-term medication(s): amlodipine , atorvastatin , diphenhydramine , fluoxetine , fluoxetine , fluticasone , furosemide , metformin , omeprazole , ropinirole , [START ON 08/06/2024] oxycodone  hcl, [START ON 09/05/2024] oxycodone  hcl, and [START ON  10/05/2024] oxycodone  hcl.  Pharmacotherapy (Medications Ordered): Meds ordered this encounter  Medications   Oxycodone  HCl 10 MG TABS    Sig: Take 1 tablet (10 mg total) by mouth every 8 (eight) hours as needed. Each refill must last 30 days.    Dispense:  90 tablet    Refill:  0    DO NOT: delete (not duplicate); no partial-fill (will deny script to complete), no refill request (F/U required). DISPENSE: 1 day early if closed on fill date. WARN: No CNS-depressants within 8 hrs of med.   Oxycodone  HCl 10 MG TABS    Sig: Take 1 tablet (10 mg total) by mouth every 8 (eight) hours as needed. Each refill must last 30 days.    Dispense:  90 tablet    Refill:  0    DO NOT: delete (not duplicate); no partial-fill (will deny script to complete), no refill request (F/U required). DISPENSE: 1 day early if closed on fill date. WARN: No CNS-depressants within 8 hrs of med.   Oxycodone  HCl 10 MG TABS  Sig: Take 1 tablet (10 mg total) by mouth every 8 (eight) hours as needed. Each refill must last 30 days.    Dispense:  90 tablet    Refill:  0    DO NOT: delete (not duplicate); no partial-fill (will deny script to complete), no refill request (F/U required). DISPENSE: 1 day early if closed on fill date. WARN: No CNS-depressants within 8 hrs of med.   Orders:  No orders of the defined types were placed in this encounter.       Return in about 3 months (around 10/08/2024) for (F2F), (MM), Emmy Blanch NP.    Recent Visits Date Type Provider Dept  04/17/24 Office Visit Chantel Teti K, NP [facility]  Showing recent visits within past 90 days and meeting all other requirements Today's Visits Date Type Provider Dept  07/10/24 Office Visit Alaura Schippers K, NP [facility]  Showing today's visits and meeting all other requirements Future Appointments Date Type Provider Dept  10/07/24 Appointment Raniah Karan K, NP [facility]  Showing future appointments within next 90  days and meeting all other requirements  I discussed the assessment and treatment plan with the patient. The patient was provided an opportunity to ask questions and all were answered. The patient agreed with the plan and demonstrated an understanding of the instructions.  Patient advised to call back or seek an in-person evaluation if the symptoms or condition worsens.  I personally spent a total of 30 minutes in the care of the patient today including preparing to see the patient, getting/reviewing separately obtained history, performing a medically appropriate exam/evaluation, counseling and educating, placing orders, referring and communicating with other health care professionals, documenting clinical information in the EHR, independently interpreting results, communicating results, and coordinating care.   Note by: Trinna Kunst K Chaniya Genter, NP (TTS and AI technology used. I apologize for any typographical errors that were not detected and corrected.) Date: 07/10/2024; Time: 9:01 PM

## 2024-10-07 ENCOUNTER — Encounter: Admitting: Nurse Practitioner

## 2024-10-10 ENCOUNTER — Ambulatory Visit: Admitting: Urology

## 2024-12-16 ENCOUNTER — Ambulatory Visit

## 2024-12-30 ENCOUNTER — Ambulatory Visit

## 2025-03-17 ENCOUNTER — Ambulatory Visit

## 2025-06-16 ENCOUNTER — Ambulatory Visit

## 2025-09-15 ENCOUNTER — Ambulatory Visit

## 2025-12-15 ENCOUNTER — Ambulatory Visit

## 2026-03-16 ENCOUNTER — Ambulatory Visit

## 2026-06-15 ENCOUNTER — Ambulatory Visit

## 2026-09-14 ENCOUNTER — Ambulatory Visit
